# Patient Record
Sex: Female | Born: 1945
Health system: Southern US, Community
[De-identification: ages and names within clinical notes are randomized; demographics above are authoritative.]

## PROBLEM LIST (undated history)

## (undated) DIAGNOSIS — K219 Gastro-esophageal reflux disease without esophagitis: Secondary | ICD-10-CM

## (undated) DIAGNOSIS — K589 Irritable bowel syndrome without diarrhea: Secondary | ICD-10-CM

## (undated) DIAGNOSIS — S62101A Fracture of unspecified carpal bone, right wrist, initial encounter for closed fracture: Secondary | ICD-10-CM

## (undated) DIAGNOSIS — I509 Heart failure, unspecified: Secondary | ICD-10-CM

## (undated) DIAGNOSIS — E785 Hyperlipidemia, unspecified: Secondary | ICD-10-CM

## (undated) DIAGNOSIS — M1990 Unspecified osteoarthritis, unspecified site: Secondary | ICD-10-CM

## (undated) DIAGNOSIS — I10 Essential (primary) hypertension: Secondary | ICD-10-CM

## (undated) DIAGNOSIS — F329 Major depressive disorder, single episode, unspecified: Secondary | ICD-10-CM

## (undated) DIAGNOSIS — R7309 Other abnormal glucose: Secondary | ICD-10-CM

## (undated) DIAGNOSIS — H269 Unspecified cataract: Secondary | ICD-10-CM

## (undated) DIAGNOSIS — F0390 Unspecified dementia without behavioral disturbance: Secondary | ICD-10-CM

## (undated) DIAGNOSIS — D649 Anemia, unspecified: Secondary | ICD-10-CM

## (undated) DIAGNOSIS — Z5189 Encounter for other specified aftercare: Secondary | ICD-10-CM

## (undated) DIAGNOSIS — R011 Cardiac murmur, unspecified: Secondary | ICD-10-CM

## (undated) DIAGNOSIS — I4891 Unspecified atrial fibrillation: Secondary | ICD-10-CM

## (undated) DIAGNOSIS — F1020 Alcohol dependence, uncomplicated: Secondary | ICD-10-CM

## (undated) DIAGNOSIS — G459 Transient cerebral ischemic attack, unspecified: Secondary | ICD-10-CM

## (undated) DIAGNOSIS — F32A Depression, unspecified: Secondary | ICD-10-CM

## (undated) DIAGNOSIS — F419 Anxiety disorder, unspecified: Secondary | ICD-10-CM

## (undated) DIAGNOSIS — T7840XA Allergy, unspecified, initial encounter: Secondary | ICD-10-CM

## (undated) HISTORY — DX: Irritable bowel syndrome, unspecified: K58.9

## (undated) HISTORY — DX: Other abnormal glucose: R73.09

## (undated) HISTORY — PX: NECK SURGERY: SHX720

## (undated) HISTORY — DX: Hyperlipidemia, unspecified: E78.5

## (undated) HISTORY — PX: TUBAL LIGATION: SHX77

## (undated) HISTORY — DX: Unspecified atrial fibrillation: I48.91

## (undated) HISTORY — DX: Gastro-esophageal reflux disease without esophagitis: K21.9

## (undated) HISTORY — DX: Allergy, unspecified, initial encounter: T78.40XA

## (undated) HISTORY — PX: APPENDECTOMY: SHX54

## (undated) HISTORY — DX: Anxiety disorder, unspecified: F41.9

## (undated) HISTORY — DX: Alcohol dependence, uncomplicated: F10.20

## (undated) HISTORY — DX: Unspecified osteoarthritis, unspecified site: M19.90

## (undated) HISTORY — DX: Encounter for other specified aftercare: Z51.89

## (undated) HISTORY — DX: Unspecified cataract: H26.9

## (undated) HISTORY — DX: Transient cerebral ischemic attack, unspecified: G45.9

## (undated) HISTORY — DX: Unspecified dementia, unspecified severity, without behavioral disturbance, psychotic disturbance, mood disturbance, and anxiety: F03.90

---
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
## 1963-05-15 HISTORY — PX: TONSILLECTOMY: SUR1361

## 1975-05-15 HISTORY — PX: CERVICAL LAMINECTOMY: SHX94

## 1987-05-15 HISTORY — PX: BREAST SURGERY: SHX581

## 1989-05-14 HISTORY — PX: ABDOMINAL HYSTERECTOMY: SHX81

## 1998-03-31 ENCOUNTER — Other Ambulatory Visit: Admission: RE | Admit: 1998-03-31 | Discharge: 1998-03-31 | Payer: Self-pay | Admitting: Obstetrics and Gynecology

## 1998-05-18 ENCOUNTER — Encounter: Payer: Self-pay | Admitting: Internal Medicine

## 1998-05-18 ENCOUNTER — Ambulatory Visit (HOSPITAL_COMMUNITY): Admission: RE | Admit: 1998-05-18 | Discharge: 1998-05-18 | Payer: Self-pay | Admitting: Internal Medicine

## 1999-05-29 ENCOUNTER — Other Ambulatory Visit: Admission: RE | Admit: 1999-05-29 | Discharge: 1999-05-29 | Payer: Self-pay | Admitting: Obstetrics and Gynecology

## 2000-08-30 ENCOUNTER — Emergency Department (HOSPITAL_COMMUNITY): Admission: EM | Admit: 2000-08-30 | Discharge: 2000-08-30 | Payer: Self-pay | Admitting: *Deleted

## 2005-08-02 ENCOUNTER — Ambulatory Visit (HOSPITAL_COMMUNITY): Admission: RE | Admit: 2005-08-02 | Discharge: 2005-08-02 | Payer: Self-pay | Admitting: Internal Medicine

## 2006-11-08 ENCOUNTER — Ambulatory Visit (HOSPITAL_COMMUNITY): Admission: RE | Admit: 2006-11-08 | Discharge: 2006-11-08 | Payer: Self-pay | Admitting: Internal Medicine

## 2008-06-29 ENCOUNTER — Emergency Department (HOSPITAL_COMMUNITY): Admission: EM | Admit: 2008-06-29 | Discharge: 2008-06-30 | Payer: Self-pay | Admitting: Emergency Medicine

## 2009-02-19 ENCOUNTER — Emergency Department (HOSPITAL_COMMUNITY): Admission: EM | Admit: 2009-02-19 | Discharge: 2009-02-19 | Payer: Self-pay | Admitting: Emergency Medicine

## 2009-05-16 ENCOUNTER — Ambulatory Visit (HOSPITAL_COMMUNITY): Admission: RE | Admit: 2009-05-16 | Discharge: 2009-05-16 | Payer: Self-pay | Admitting: Internal Medicine

## 2010-08-29 LAB — RAPID URINE DRUG SCREEN, HOSP PERFORMED
Amphetamines: NOT DETECTED
Barbiturates: NOT DETECTED
Benzodiazepines: POSITIVE — AB
Opiates: NOT DETECTED

## 2010-08-29 LAB — URINALYSIS, ROUTINE W REFLEX MICROSCOPIC
Bilirubin Urine: NEGATIVE
Glucose, UA: NEGATIVE mg/dL
Ketones, ur: NEGATIVE mg/dL
pH: 6 (ref 5.0–8.0)

## 2010-08-29 LAB — ETHANOL: Alcohol, Ethyl (B): 94 mg/dL — ABNORMAL HIGH (ref 0–10)

## 2011-04-30 ENCOUNTER — Other Ambulatory Visit (HOSPITAL_COMMUNITY): Payer: Self-pay | Admitting: Internal Medicine

## 2011-04-30 DIAGNOSIS — Z1231 Encounter for screening mammogram for malignant neoplasm of breast: Secondary | ICD-10-CM

## 2011-05-17 LAB — HM COLONOSCOPY

## 2011-06-04 ENCOUNTER — Ambulatory Visit (HOSPITAL_COMMUNITY)
Admission: RE | Admit: 2011-06-04 | Discharge: 2011-06-04 | Disposition: A | Discharge status: Home / Self Care | Payer: Medicare Other | Source: Ambulatory Visit | Attending: Internal Medicine | Admitting: Internal Medicine

## 2011-06-04 DIAGNOSIS — Z1231 Encounter for screening mammogram for malignant neoplasm of breast: Secondary | ICD-10-CM | POA: Insufficient documentation

## 2011-09-04 ENCOUNTER — Emergency Department (HOSPITAL_COMMUNITY): Payer: Medicare Other

## 2011-09-04 ENCOUNTER — Encounter (HOSPITAL_COMMUNITY): Payer: Self-pay | Admitting: Emergency Medicine

## 2011-09-04 ENCOUNTER — Inpatient Hospital Stay (HOSPITAL_COMMUNITY)
Admission: EM | Admit: 2011-09-04 | Discharge: 2011-09-06 | DRG: 641 | Disposition: A | Discharge status: Home / Self Care | Payer: Medicare Other | Attending: Internal Medicine | Admitting: Internal Medicine

## 2011-09-04 DIAGNOSIS — F3289 Other specified depressive episodes: Secondary | ICD-10-CM | POA: Diagnosis present

## 2011-09-04 DIAGNOSIS — D72829 Elevated white blood cell count, unspecified: Secondary | ICD-10-CM | POA: Diagnosis present

## 2011-09-04 DIAGNOSIS — E871 Hypo-osmolality and hyponatremia: Secondary | ICD-10-CM | POA: Diagnosis present

## 2011-09-04 DIAGNOSIS — R9431 Abnormal electrocardiogram [ECG] [EKG]: Secondary | ICD-10-CM | POA: Diagnosis present

## 2011-09-04 DIAGNOSIS — Z9181 History of falling: Secondary | ICD-10-CM

## 2011-09-04 DIAGNOSIS — N289 Disorder of kidney and ureter, unspecified: Secondary | ICD-10-CM | POA: Diagnosis present

## 2011-09-04 DIAGNOSIS — F329 Major depressive disorder, single episode, unspecified: Secondary | ICD-10-CM | POA: Diagnosis present

## 2011-09-04 DIAGNOSIS — E876 Hypokalemia: Principal | ICD-10-CM | POA: Diagnosis present

## 2011-09-04 DIAGNOSIS — T502X5A Adverse effect of carbonic-anhydrase inhibitors, benzothiadiazides and other diuretics, initial encounter: Secondary | ICD-10-CM | POA: Diagnosis present

## 2011-09-04 DIAGNOSIS — I639 Cerebral infarction, unspecified: Secondary | ICD-10-CM

## 2011-09-04 DIAGNOSIS — I10 Essential (primary) hypertension: Secondary | ICD-10-CM | POA: Diagnosis present

## 2011-09-04 DIAGNOSIS — N179 Acute kidney failure, unspecified: Secondary | ICD-10-CM | POA: Diagnosis present

## 2011-09-04 HISTORY — DX: Major depressive disorder, single episode, unspecified: F32.9

## 2011-09-04 HISTORY — DX: Depression, unspecified: F32.A

## 2011-09-04 HISTORY — DX: Essential (primary) hypertension: I10

## 2011-09-04 LAB — DIFFERENTIAL
Basophils Relative: 0 % (ref 0–1)
Eosinophils Absolute: 0 10*3/uL (ref 0.0–0.7)
Neutrophils Relative %: 84 % — ABNORMAL HIGH (ref 43–77)

## 2011-09-04 LAB — CBC
MCH: 29.2 pg (ref 26.0–34.0)
MCHC: 34.1 g/dL (ref 30.0–36.0)
Platelets: 297 10*3/uL (ref 150–400)

## 2011-09-04 NOTE — ED Notes (Signed)
Pt states she fell in bathroom yesterday and hit head. States floor in BR is uneven and she tripped and fell. Pt states she has some abd pain and has problems with constipation. States she had a BM after just arriving to ED.

## 2011-09-04 NOTE — ED Notes (Signed)
Pt states she was dizzy upon getting up to ambulate per EMS. Pt states her knees got wobbly when trying to stand.

## 2011-09-04 NOTE — ED Provider Notes (Signed)
History     CSN: [id number]  Arrival date & time 09/04/11  2036   First MD Initiated Contact with Patient 09/04/11 2148      Chief Complaint  Patient presents with  . Altered Mental Status  . Fall    (Consider location/radiation/quality/duration/timing/severity/associated sxs/prior treatment) Patient is a 66 y.o. female presenting with altered mental status and fall. The history is provided by the patient.  Altered Mental Status  Fall  She noticed onset yesterday of being off balance and she fell once yesterday and once today. She did hit her head when she fell. She denies loss of consciousness. Her niece spoke with her yesterday morning and her speech was normal. Her niece called her this afternoon and she was not making sense when she was talking. Patient denies any injury but states that she is off balance when she walks. There's been no vertigo no nausea or vomiting. She denies chest pain, heaviness, tightness, or pressure. Symptoms have been unchanging. Nothing makes it better nothing makes it worse. She denies any pain anywhere.  Past Medical History  Diagnosis Date  . Hypertension   . Depression     No past surgical history on file.  No family history on file.  History  Substance Use Topics  . Smoking status: Not on file  . Smokeless tobacco: Not on file  . Alcohol Use:     OB History    Grav Para Term Preterm Abortions TAB SAB Ect Mult Living                  Review of Systems  Psychiatric/Behavioral: Positive for altered mental status.  All other systems reviewed and are negative.    Allergies  Review of patient's allergies indicates no known allergies.  Home Medications   Current Outpatient Rx  Name Route Sig Dispense Refill  . ALPRAZOLAM 1 MG PO TABS Oral Take 1 mg by mouth 3 (three) times daily as needed. For anxiety.    Marland Kitchen BISOPROLOL-HYDROCHLOROTHIAZIDE 5-6.25 MG PO TABS Oral Take 1 tablet by mouth daily.    . BUPROPION HCL ER (XL) 150 MG PO  TB24 Oral Take 450 mg by mouth daily.    Marland Kitchen FLUOXETINE HCL 40 MG PO CAPS Oral Take 40 mg by mouth daily.    Marland Kitchen LAMOTRIGINE 25 MG PO TABS Oral Take 25 mg by mouth daily.    Marland Kitchen MIRTAZAPINE 15 MG PO TABS Oral Take 15 mg by mouth at bedtime.    Marland Kitchen OLANZAPINE 10 MG PO TABS Oral Take 10 mg by mouth at bedtime.    . SERTRALINE HCL 100 MG PO TABS Oral Take 200 mg by mouth daily.    . TORSEMIDE 20 MG PO TABS Oral Take 20 mg by mouth 2 (two) times daily.      BP 116/52  Pulse 64  Temp(Src) 99.5 F (37.5 C) (Oral)  Resp 16  SpO2 98%  Physical Exam  Nursing note and vitals reviewed.  66 year old female who is awake, alert, and in no acute distress. Vital signs are normal. Oxygen saturation is 98% which is normal. Head is normocephalic and atraumatic. PERRLA, EOMI without nystagmus. Fundi show no hemorrhage, exudate, or papilledema. There is no facial asymmetry and tongue protrudes in the midline. Neck is nontender and supple without adenopathy or JVD. There is no carotid bruit. Back is nontender. Lungs are clear without rales, wheezes, or rhonchi. Heart has regular rate rhythm without murmur. Abdomen is soft, flat, nontender without masses or hepatosplenomegaly.  Extremities have full range of motion, no cyanosis or edema. Skin is warm and dry without rash. Neurologic: She is awake, alert, and oriented x3. Speech is spontaneous and appropriate and fluent without any dysarthria. Cranial nerves are intact. There is no pronator drift and no focal motor deficit. There is past pointing on the right on finger to nose testing. She has difficulty following commands to test rapid alternating motion, but she performs at the test only very slowly with her right hand and cannot follow the command to do the test with her left hand. Romberg test is grossly positive with the patient consistently falling backward. She is stable when sitting up.  ED Course  Procedures (including critical care time)  Results for orders placed  during the hospital encounter of 09/04/11  CBC      Component Value Range   WBC 19.9 (*) 4.0 - 10.5 (K/uL)   RBC 4.63  3.87 - 5.11 (MIL/uL)   Hemoglobin 13.5  12.0 - 15.0 (g/dL)   HCT 16.1  09.6 - 04.5 (%)   MCV 85.5  78.0 - 100.0 (fL)   MCH 29.2  26.0 - 34.0 (pg)   MCHC 34.1  30.0 - 36.0 (g/dL)   RDW 40.9  81.1 - 91.4 (%)   Platelets 297  150 - 400 (K/uL)  DIFFERENTIAL      Component Value Range   Neutrophils Relative 84 (*) 43 - 77 (%)   Neutro Abs 16.7 (*) 1.7 - 7.7 (K/uL)   Lymphocytes Relative 8 (*) 12 - 46 (%)   Lymphs Abs 1.5  0.7 - 4.0 (K/uL)   Monocytes Relative 8  3 - 12 (%)   Monocytes Absolute 1.6 (*) 0.1 - 1.0 (K/uL)   Eosinophils Relative 0  0 - 5 (%)   Eosinophils Absolute 0.0  0.0 - 0.7 (K/uL)   Basophils Relative 0  0 - 1 (%)   Basophils Absolute 0.0  0.0 - 0.1 (K/uL)  COMPREHENSIVE METABOLIC PANEL      Component Value Range   Sodium 133 (*) 135 - 145 (mEq/L)   Potassium 2.3 (*) 3.5 - 5.1 (mEq/L)   Chloride 87 (*) 96 - 112 (mEq/L)   CO2 29  19 - 32 (mEq/L)   Glucose, Bld 124 (*) 70 - 99 (mg/dL)   BUN 30 (*) 6 - 23 (mg/dL)   Creatinine, Ser 7.82 (*) 0.50 - 1.10 (mg/dL)   Calcium 9.2  8.4 - 95.6 (mg/dL)   Total Protein 7.1  6.0 - 8.3 (g/dL)   Albumin 4.2  3.5 - 5.2 (g/dL)   AST 23  0 - 37 (U/L)   ALT 15  0 - 35 (U/L)   Alkaline Phosphatase 66  39 - 117 (U/L)   Total Bilirubin 0.7  0.3 - 1.2 (mg/dL)   GFR calc non Af Amer 37 (*) >90 (mL/min)   GFR calc Af Amer 43 (*) >90 (mL/min)  CARDIAC PANEL(CRET KIN+CKTOT+MB+TROPI)      Component Value Range   Total CK 181 (*) 7 - 177 (U/L)   CK, MB 2.7  0.3 - 4.0 (ng/mL)   Troponin I <0.30  <0.30 (ng/mL)   Relative Index 1.5  0.0 - 2.5   PROTIME-INR      Component Value Range   Prothrombin Time 13.5  11.6 - 15.2 (seconds)   INR 1.01  0.00 - 1.49   APTT      Component Value Range   aPTT 32  24 - 37 (seconds)  Ct Head Wo Contrast  09/04/2011  *RADIOLOGY REPORT*  Clinical Data:  Fall, head trauma, dizziness,  neck trauma  CT HEAD WITHOUT CONTRAST CT CERVICAL SPINE WITHOUT CONTRAST  Technique:  Multidetector CT imaging of the head and cervical spine was performed following the standard protocol without intravenous contrast.  Multiplanar CT image reconstructions of the cervical spine were also generated.  Comparison:   None  CT HEAD  Findings: Minimal cortical volume loss noted with proportional ventricular prominence. No acute hemorrhage, acute infarction, or mass lesion is identified.  No midline shift.  No ventriculomegaly. No skull fracture.  Orbits and paranasal sinuses are intact.  IMPRESSION: No acute intracranial finding.  CT CERVICAL SPINE  Findings: C1 through the cervical thoracic junction is visualized in its entirety. No precervical soft tissue widening is present. Disc degenerative change is noted from C4-C7, most prominent at C5- C6 with mildly decreased vertebral body heights and intervertebral disc space, moderate uncovertebral joint hypertrophy and narrowing of the bilateral neural foramina.  No fracture or dislocation.  IMPRESSION: Mid cervical degenerative change, no acute osseous abnormality.  Original Report Authenticated By: Harrel Lemon, M.D.   Ct Cervical Spine Wo Contrast  09/04/2011  *RADIOLOGY REPORT*  Clinical Data:  Fall, head trauma, dizziness, neck trauma  CT HEAD WITHOUT CONTRAST CT CERVICAL SPINE WITHOUT CONTRAST  Technique:  Multidetector CT imaging of the head and cervical spine was performed following the standard protocol without intravenous contrast.  Multiplanar CT image reconstructions of the cervical spine were also generated.  Comparison:   None  CT HEAD  Findings: Minimal cortical volume loss noted with proportional ventricular prominence. No acute hemorrhage, acute infarction, or mass lesion is identified.  No midline shift.  No ventriculomegaly. No skull fracture.  Orbits and paranasal sinuses are intact.  IMPRESSION: No acute intracranial finding.  CT CERVICAL SPINE   Findings: C1 through the cervical thoracic junction is visualized in its entirety. No precervical soft tissue widening is present. Disc degenerative change is noted from C4-C7, most prominent at C5- C6 with mildly decreased vertebral body heights and intervertebral disc space, moderate uncovertebral joint hypertrophy and narrowing of the bilateral neural foramina.  No fracture or dislocation.  IMPRESSION: Mid cervical degenerative change, no acute osseous abnormality.  Original Report Authenticated By: Harrel Lemon, M.D.   Dg Chest Portable 1 View  09/04/2011  *RADIOLOGY REPORT*  Clinical Data: Altered mental status, fall  PORTABLE CHEST - 1 VIEW  Comparison: 05/16/2009  Findings: The patient is rotated to the right.  Heart size is normal. Lung volumes are low with crowding of the bronchovascular markings. Diffusely increased interstitial lung markings noted, without focal pulmonary opacity.  No pleural effusion.  IMPRESSION: Low volumes with crowding of the bronchovascular markings and diffuse interstitial prominence but no focal acute finding.  Original Report Authenticated By: Harrel Lemon, M.D.      Date: 09/04/2011  Rate: 64  Rhythm: normal sinus rhythm  QRS Axis: normal  Intervals: QT prolonged  ST/T Wave abnormalities: nonspecific ST changes  Conduction Disutrbances:nonspecific intraventricular conduction delay  Narrative Interpretation: Prolonged QT interval of with borderline intraventricular conduction delay and minor nonspecific ST segments. No old ECG available for comparison.  Old EKG Reviewed: none available  Potassium has come back very low. In light of prolonged QT interval, magnesium will be checked and she may need magnesium supplementation. She is given oral and intravenous potassium. Case is discussed with Dr. Kaylyn Layer who agrees to admit the patient.  1. Stroke  2. Renal insufficiency   3. Hypokalemia       MDM  Symptoms and signs strongly suggestive of  posterior circulation stroke with central vertigo. MRI scan is not available tonight but CT scan will be obtained and she will need to be admitted.        Dione Booze, MD 09/05/11 [id number]

## 2011-09-04 NOTE — ED Notes (Signed)
Pt BIB EMS. Pt lives at home by herself. Pt fell yesterday. Niece went over to check on pt and found her to have jumbled words and unsteady gait. Stroke screen negative per EMS. Pt has hx of HTN and Depression. Pt a/o x 4 per EMS.

## 2011-09-04 NOTE — ED Notes (Signed)
MD at bedside.  Dr. Glick at bedside 

## 2011-09-04 NOTE — ED Notes (Signed)
[id number]<AV> Expected date:<BR> Expected time:<BR> Means of arrival:<BR> Comments:<BR> Altered mental status

## 2011-09-04 NOTE — ED Notes (Signed)
Unable to obtain vital signs on patient due to patients urge to void. Patient refusing to allow at this time until she uses the restroom. Patient refuses to use bedpan even though gait is unsteady.

## 2011-09-05 ENCOUNTER — Encounter (HOSPITAL_COMMUNITY): Payer: Self-pay | Admitting: Internal Medicine

## 2011-09-05 DIAGNOSIS — N289 Disorder of kidney and ureter, unspecified: Secondary | ICD-10-CM | POA: Diagnosis present

## 2011-09-05 DIAGNOSIS — I359 Nonrheumatic aortic valve disorder, unspecified: Secondary | ICD-10-CM

## 2011-09-05 LAB — CBC
HCT: 37.2 % (ref 36.0–46.0)
Hemoglobin: 12.5 g/dL (ref 12.0–15.0)
MCV: 86.5 fL (ref 78.0–100.0)
RDW: 13.8 % (ref 11.5–15.5)
WBC: 16.9 10*3/uL — ABNORMAL HIGH (ref 4.0–10.5)

## 2011-09-05 LAB — BASIC METABOLIC PANEL
BUN: 28 mg/dL — ABNORMAL HIGH (ref 6–23)
CO2: 26 mEq/L (ref 19–32)
Chloride: 97 mEq/L (ref 96–112)
Creatinine, Ser: 1.27 mg/dL — ABNORMAL HIGH (ref 0.50–1.10)
GFR calc Af Amer: 49 mL/min — ABNORMAL LOW (ref >90)
Potassium: 3.3 mEq/L — ABNORMAL LOW (ref 3.5–5.1)

## 2011-09-05 LAB — URINALYSIS, ROUTINE W REFLEX MICROSCOPIC
Bilirubin Urine: NEGATIVE
Glucose, UA: NEGATIVE mg/dL
Ketones, ur: NEGATIVE mg/dL
Leukocytes, UA: NEGATIVE
Protein, ur: NEGATIVE mg/dL

## 2011-09-05 LAB — CARDIAC PANEL(CRET KIN+CKTOT+MB+TROPI)
CK, MB: 6 ng/mL — ABNORMAL HIGH (ref 0.3–4.0)
Relative Index: 1.5 (ref 0.0–2.5)
Relative Index: 1.5 (ref 0.0–2.5)
Relative Index: 2 (ref 0.0–2.5)
Total CK: 181 U/L — ABNORMAL HIGH (ref 7–177)
Total CK: 298 U/L — ABNORMAL HIGH (ref 7–177)
Troponin I: <0.3 ng/mL (ref <0.30)

## 2011-09-05 LAB — TSH: TSH: 2.642 u[IU]/mL (ref 0.350–4.500)

## 2011-09-05 LAB — COMPREHENSIVE METABOLIC PANEL
ALT: 15 U/L (ref 0–35)
Albumin: 4.2 g/dL (ref 3.5–5.2)
Alkaline Phosphatase: 66 U/L (ref 39–117)
BUN: 30 mg/dL — ABNORMAL HIGH (ref 6–23)
Potassium: 2.3 mEq/L — CL (ref 3.5–5.1)
Sodium: 133 mEq/L — ABNORMAL LOW (ref 135–145)
Total Protein: 7.1 g/dL (ref 6.0–8.3)

## 2011-09-05 LAB — PROTIME-INR: Prothrombin Time: 13.5 seconds (ref 11.6–15.2)

## 2011-09-05 LAB — MAGNESIUM: Magnesium: 3 mg/dL — ABNORMAL HIGH (ref 1.5–2.5)

## 2011-09-05 LAB — NA AND K (SODIUM & POTASSIUM), RAND UR: Sodium, Ur: 10 mEq/L

## 2011-09-05 LAB — UREA NITROGEN, URINE: Urea Nitrogen, Ur: 372 mg/dL

## 2011-09-05 MED ORDER — POTASSIUM CHLORIDE 10 MEQ/100ML IV SOLN
10.0000 meq | INTRAVENOUS | Status: AC
  Administered 2011-09-05 (×6): 10 meq via INTRAVENOUS
  Filled 2011-09-05 (×6): qty 100

## 2011-09-05 MED ORDER — SENNA 8.6 MG PO TABS
1.0000 | ORAL_TABLET | Freq: Two times a day (BID) | ORAL | Status: DC
  Administered 2011-09-05 – 2011-09-06 (×2): 8.6 mg via ORAL
  Filled 2011-09-05 (×2): qty 1

## 2011-09-05 MED ORDER — ONDANSETRON HCL 4 MG PO TABS: 4.0000 mg | ORAL_TABLET | Freq: Four times a day (QID) | ORAL | Status: DC | PRN

## 2011-09-05 MED ORDER — BUPROPION HCL ER (XL) 300 MG PO TB24
450.0000 mg | ORAL_TABLET | Freq: Every day | ORAL | Status: DC
  Administered 2011-09-05 – 2011-09-06 (×2): 450 mg via ORAL
  Filled 2011-09-05 (×2): qty 1

## 2011-09-05 MED ORDER — ACETAMINOPHEN 650 MG RE SUPP: 650.0000 mg | Freq: Four times a day (QID) | RECTAL | Status: DC | PRN

## 2011-09-05 MED ORDER — FLUOXETINE HCL 20 MG PO CAPS
40.0000 mg | ORAL_CAPSULE | Freq: Every day | ORAL | Status: DC
  Administered 2011-09-05 – 2011-09-06 (×2): 40 mg via ORAL
  Filled 2011-09-05 (×2): qty 2

## 2011-09-05 MED ORDER — SODIUM CHLORIDE 0.9 % IJ SOLN
3.0000 mL | Freq: Two times a day (BID) | INTRAMUSCULAR | Status: DC
  Administered 2011-09-05: 3 mL via INTRAVENOUS

## 2011-09-05 MED ORDER — SERTRALINE HCL 100 MG PO TABS
200.0000 mg | ORAL_TABLET | Freq: Every day | ORAL | Status: DC
Start: 2011-09-05 — End: 2011-09-06
  Administered 2011-09-05 – 2011-09-06 (×2): 200 mg via ORAL
  Filled 2011-09-05 (×2): qty 2

## 2011-09-05 MED ORDER — POTASSIUM CHLORIDE CRYS ER 20 MEQ PO TBCR
40.0000 meq | EXTENDED_RELEASE_TABLET | Freq: Once | ORAL | Status: AC
  Administered 2011-09-05: 40 meq via ORAL
  Filled 2011-09-05: qty 2

## 2011-09-05 MED ORDER — POTASSIUM CHLORIDE IN NACL 20-0.9 MEQ/L-% IV SOLN
INTRAVENOUS | Status: DC
  Administered 2011-09-05 (×2): via INTRAVENOUS
  Filled 2011-09-05 (×5): qty 1000

## 2011-09-05 MED ORDER — SODIUM CHLORIDE 0.9 % IV SOLN
INTRAVENOUS | Status: AC
  Administered 2011-09-05: 04:00:00 via INTRAVENOUS

## 2011-09-05 MED ORDER — POLYETHYLENE GLYCOL 3350 17 G PO PACK
17.0000 g | PACK | Freq: Every day | ORAL | Status: DC | PRN
  Filled 2011-09-05: qty 1

## 2011-09-05 MED ORDER — POTASSIUM CHLORIDE 10 MEQ/100ML IV SOLN
10.0000 meq | Freq: Once | INTRAVENOUS | Status: AC
  Administered 2011-09-05: 10 meq via INTRAVENOUS
  Filled 2011-09-05: qty 100

## 2011-09-05 MED ORDER — ONDANSETRON HCL 4 MG/2ML IJ SOLN: 4.0000 mg | Freq: Three times a day (TID) | INTRAMUSCULAR | Status: AC | PRN

## 2011-09-05 MED ORDER — ONDANSETRON HCL 4 MG/2ML IJ SOLN: 4.0000 mg | Freq: Four times a day (QID) | INTRAMUSCULAR | Status: DC | PRN

## 2011-09-05 MED ORDER — DOCUSATE SODIUM 100 MG PO CAPS
100.0000 mg | ORAL_CAPSULE | Freq: Two times a day (BID) | ORAL | Status: DC
  Administered 2011-09-05 – 2011-09-06 (×2): 100 mg via ORAL
  Filled 2011-09-05 (×4): qty 1

## 2011-09-05 MED ORDER — POTASSIUM CHLORIDE CRYS ER 20 MEQ PO TBCR
40.0000 meq | EXTENDED_RELEASE_TABLET | Freq: Two times a day (BID) | ORAL | Status: AC
  Administered 2011-09-05 (×2): 40 meq via ORAL
  Filled 2011-09-05 (×2): qty 2

## 2011-09-05 MED ORDER — LAMOTRIGINE 25 MG PO TABS
25.0000 mg | ORAL_TABLET | Freq: Every day | ORAL | Status: DC
  Administered 2011-09-05 – 2011-09-06 (×2): 25 mg via ORAL
  Filled 2011-09-05 (×2): qty 1

## 2011-09-05 MED ORDER — ACETAMINOPHEN 325 MG PO TABS
650.0000 mg | ORAL_TABLET | Freq: Four times a day (QID) | ORAL | Status: DC | PRN
  Administered 2011-09-05 – 2011-09-06 (×3): 650 mg via ORAL
  Filled 2011-09-05 (×3): qty 2

## 2011-09-05 MED ORDER — HEPARIN SODIUM (PORCINE) 5000 UNIT/ML IJ SOLN
5000.0000 [IU] | Freq: Three times a day (TID) | INTRAMUSCULAR | Status: DC
  Administered 2011-09-05 – 2011-09-06 (×4): 5000 [IU] via SUBCUTANEOUS
  Filled 2011-09-05 (×7): qty 1

## 2011-09-05 NOTE — ED Notes (Signed)
Attempted to call report. RN unable to take report at this time. Will call back.  

## 2011-09-05 NOTE — ED Notes (Signed)
Pt's niece, phone number [phone].

## 2011-09-05 NOTE — ED Notes (Signed)
MD at bedside.  Dr. Glick at bedside 

## 2011-09-05 NOTE — Progress Notes (Signed)
  Patient admitted earlier today. H&P reviewed.  Patient feels fine. Denies any complaints. No syncopal episodes.  Vital signs reviewed. No focal neurological deficits. Systolic murmur at aortic area. Lungs are clear. Abdomen is soft.  Continue current treatment. Recheck labs later today. PT/OT Agree with holding off on MRI. Order ECHO. Repeat EKG  Full Code DVT prophylaxis  Evann Koelzer 09/05/2011 7:49 AM

## 2011-09-05 NOTE — Progress Notes (Signed)
   CARE MANAGEMENT NOTE 09/05/2011  Patient:  Angelica Kelley, Angelica Kelley   Account Number:  [id number]  Date Initiated:  09/05/2011  Documentation initiated by:  Jiles Crocker  Subjective/Objective Assessment:   ADMITTED WITH ALCO, FALLS, HYPONATREMIA, HYPOKALEMIA     Action/Plan:   PCP IS DR Oneta Rack; LIVES AT HOME ALONE   Anticipated DC Date:  09/12/2011   Anticipated DC Plan:  HOME/SELF CARE          Status of service: CONTINUE TO FOLLOW FOR DCP   Discharge Disposition:  HOME/SELF CARE  Per UR Regulation:  Reviewed for med. necessity/level of care/duration of stay  Comments:  09/05/2011- B Sophia Cubero RN, BSN, MHA

## 2011-09-05 NOTE — H&P (Signed)
PCP:   Nadean Corwin, MD, MD  Confirmed with pt  Chief Complaint:  Fall  HPI: 66yoF with h/o HTN, depression, presents with a fall and found to have  electrolyte abnormalities, acute vs chronic renal insufficiency,  leukocytosis.   Pt does not have much medical records in [facility], but is pretty good historian.  There was apparently a neice here earlier but now gone. She lives at home  alone and is pretty healthy, only endorses HTN and depression, and possibly  prior vertigo a year ago, ambulatory without cane/walker. She was in her  usual state of health including having seen her PCP on April 1st for a check  up, until Tuesday afternoon when she was in her bathroom and got "swimmy  headed" with some spinning of the room. she was trying to move something in  her bathroom. She apparently had a fall but can't really elaborate how, but  it doesn't seem like had LOC. Her neice helped her up. She reports only one  fall but there may have been two per ED staff. Reportedly her neice called  her later and thought her speech was off, and her balance may have been off.   In the ED, Tmax was 99.5, HR 64-66, BP 104/45 - 116/52. Labs with hypoNa 133,  hypoK 2.3, hypoCl 87, renal 30/1.42. Negative cardiac enyzmes. WBC 19.9 with  84% neutros, rest of CBC negative. UA negative. CXR with low volumes,  crowding of bronchovasculature, diffuse interstitial prominence, but nothing  acute. CT head with nothing acute, CT c-spine negative. Pt was given 10 mEq  IV KCl and 40 mEq PO.   PT states she's been in good health without major recent complaints, other  than constipation for which she had to take a regimen and is now going more  frequently. She denies any other promontory symptoms during the falls, no CP,  SOB, palpitations, focal neuro deficits, facial drooping. She denies any GI  symptoms other than constipation, no cough, no dysuria. ROS otherwise  negative. She is very thirsty, asking for  water.   Past Medical History  Diagnosis Date  . Hypertension   . Depression     Past Surgical History  Procedure Date  . Neck surgery Remote     Ruptured disc, per patient. Got infected.     Medications:  HOME MEDS: She can name demadex and "another BP med" and can name prozac, wellbutrin, xanax, and lamictal Prior to Admission medications   Medication Sig Start Date End Date Taking? Authorizing Provider  ALPRAZolam Prudy Feeler) 1 MG tablet Take 1 mg by mouth 3 (three) times daily as needed. For anxiety.   Yes Historical Provider, MD  bisoprolol-hydrochlorothiazide (ZIAC) 5-6.25 MG per tablet Take 1 tablet by mouth daily.   Yes Historical Provider, MD  buPROPion (WELLBUTRIN XL) 150 MG 24 hr tablet Take 450 mg by mouth daily.   Yes Historical Provider, MD  FLUoxetine (PROZAC) 40 MG capsule Take 40 mg by mouth daily.   Yes Historical Provider, MD  lamoTRIgine (LAMICTAL) 25 MG tablet Take 25 mg by mouth daily.   Yes Historical Provider, MD  mirtazapine (REMERON) 15 MG tablet Take 15 mg by mouth at bedtime.   Yes Historical Provider, MD  OLANZapine (ZYPREXA) 10 MG tablet Take 10 mg by mouth at bedtime.   Yes Historical Provider, MD  sertraline (ZOLOFT) 100 MG tablet Take 200 mg by mouth daily.   Yes Historical Provider, MD  torsemide (DEMADEX) 20 MG tablet Take 20 mg  by mouth 2 (two) times daily.   Yes Historical Provider, MD    Allergies:  No Known Allergies  Social History:   does not have a smoking history on file. She does not have any smokeless tobacco history on file. Her alcohol and drug histories not on file. Pt's niece, phone number [phone]. Pt lives alone at home, and doesn't use a cane or walker, is still pretty active. Never smoker.   Family History: No family history on file.  Physical Exam: Filed Vitals:   09/05/11 0333 09/05/11 0341 09/05/11 0404 09/05/11 0407  BP: 103/56 119/68 92/59 95/56   Pulse: 64 69 77 75  Temp: 98.4 F (36.9 C)     TempSrc: Oral       Resp: 17     SpO2: 99%      Blood pressure 95/56, pulse 75, temperature 98.4 F (36.9 C), temperature source Oral, resp. rate 17, SpO2 99.00%. Gen: Elderly appearing F in no distress, is a bit fidgety and a little  forgetful on details but overall can give her history and appears well. I saw  her getting out of stretcher to bed, seemed a little unstead but not  floridly. She was able to get to the bedside toilet too. Breathing  comfortably. Pleasant and nice.  HEENT: Pupils round and equal, irises, conjunctivae clear. EOMI, no  nystagmus, conjugate gaze. Mouth/tongue/lips are frankly parched appearing.  Lungs: CTAB no w/c/r, smooth laminar flow, no adventitious sounds, normal  exam Heart: Regular, no m/g appreciated, possible slight crescendo systolic murmur  appreciated Abd: Soft, not tender or distended, normal exam, unimpressive Extrem: Warm, perfusing well, radials palpable, no BLE edema noted but she  does have varicosities. Normal exam Neuro: Alert and attentive, answers questions well, moves extremities on her  own with good intact strength throughout CN 2-12 intact no slurring or  drooping, basically non focal neuro exam Back: Appears normal without TTP of the spine, there is a low left near  midline ecchymosis from the fall.    Labs & Imaging Results for orders placed during the hospital encounter of 09/04/11 (from the past 48 hour(s))  CBC     Status: Abnormal   Collection Time   09/04/11 11:20 PM      Component Value Range Comment   WBC 19.9 (*) 4.0 - 10.5 (K/uL)    RBC 4.63  3.87 - 5.11 (MIL/uL)    Hemoglobin 13.5  12.0 - 15.0 (g/dL)    HCT 09.8  11.9 - 14.7 (%)    MCV 85.5  78.0 - 100.0 (fL)    MCH 29.2  26.0 - 34.0 (pg)    MCHC 34.1  30.0 - 36.0 (g/dL)    RDW 82.9  56.2 - 13.0 (%)    Platelets 297  150 - 400 (K/uL)   DIFFERENTIAL     Status: Abnormal   Collection Time   09/04/11 11:20 PM      Component Value Range Comment   Neutrophils Relative 84 (*) 43 - 77  (%)    Neutro Abs 16.7 (*) 1.7 - 7.7 (K/uL)    Lymphocytes Relative 8 (*) 12 - 46 (%)    Lymphs Abs 1.5  0.7 - 4.0 (K/uL)    Monocytes Relative 8  3 - 12 (%)    Monocytes Absolute 1.6 (*) 0.1 - 1.0 (K/uL)    Eosinophils Relative 0  0 - 5 (%)    Eosinophils Absolute 0.0  0.0 - 0.7 (K/uL)    Basophils Relative  0  0 - 1 (%)    Basophils Absolute 0.0  0.0 - 0.1 (K/uL)   COMPREHENSIVE METABOLIC PANEL     Status: Abnormal   Collection Time   09/04/11 11:20 PM      Component Value Range Comment   Sodium 133 (*) 135 - 145 (mEq/L)    Potassium 2.3 (*) 3.5 - 5.1 (mEq/L)    Chloride 87 (*) 96 - 112 (mEq/L)    CO2 29  19 - 32 (mEq/L)    Glucose, Bld 124 (*) 70 - 99 (mg/dL)    BUN 30 (*) 6 - 23 (mg/dL)    Creatinine, Ser 4.09 (*) 0.50 - 1.10 (mg/dL)    Calcium 9.2  8.4 - 10.5 (mg/dL)    Total Protein 7.1  6.0 - 8.3 (g/dL)    Albumin 4.2  3.5 - 5.2 (g/dL)    AST 23  0 - 37 (U/L)    ALT 15  0 - 35 (U/L)    Alkaline Phosphatase 66  39 - 117 (U/L)    Total Bilirubin 0.7  0.3 - 1.2 (mg/dL)    GFR calc non Af Amer 37 (*) >90 (mL/min)    GFR calc Af Amer 43 (*) >90 (mL/min)   CARDIAC PANEL(CRET KIN+CKTOT+MB+TROPI)     Status: Abnormal   Collection Time   09/04/11 11:20 PM      Component Value Range Comment   Total CK 181 (*) 7 - 177 (U/L)    CK, MB 2.7  0.3 - 4.0 (ng/mL)    Troponin I <0.30  <0.30 (ng/mL)    Relative Index 1.5  0.0 - 2.5    PROTIME-INR     Status: Normal   Collection Time   09/04/11 11:20 PM      Component Value Range Comment   Prothrombin Time 13.5  11.6 - 15.2 (seconds)    INR 1.01  0.00 - 1.49    APTT     Status: Normal   Collection Time   09/04/11 11:20 PM      Component Value Range Comment   aPTT 32  24 - 37 (seconds)   MAGNESIUM     Status: Abnormal   Collection Time   09/04/11 11:20 PM      Component Value Range Comment   Magnesium 3.0 (*) 1.5 - 2.5 (mg/dL)   URINALYSIS, ROUTINE W REFLEX MICROSCOPIC     Status: Normal   Collection Time   09/05/11  1:03 AM       Component Value Range Comment   Color, Urine YELLOW  YELLOW     APPearance CLEAR  CLEAR     Specific Gravity, Urine 1.013  1.005 - 1.030     pH 7.0  5.0 - 8.0     Glucose, UA NEGATIVE  NEGATIVE (mg/dL)    Hgb urine dipstick NEGATIVE  NEGATIVE     Bilirubin Urine NEGATIVE  NEGATIVE     Ketones, ur NEGATIVE  NEGATIVE (mg/dL)    Protein, ur NEGATIVE  NEGATIVE (mg/dL)    Urobilinogen, UA 1.0  0.0 - 1.0 (mg/dL)    Nitrite NEGATIVE  NEGATIVE     Leukocytes, UA NEGATIVE  NEGATIVE  MICROSCOPIC NOT DONE ON URINES WITH NEGATIVE PROTEIN, BLOOD, LEUKOCYTES, NITRITE, OR GLUCOSE <1000 mg/dL.   Ct Head Wo Contrast  09/04/2011  *RADIOLOGY REPORT*  Clinical Data:  Fall, head trauma, dizziness, neck trauma  CT HEAD WITHOUT CONTRAST CT CERVICAL SPINE WITHOUT CONTRAST  Technique:  Multidetector CT imaging of  the head and cervical spine was performed following the standard protocol without intravenous contrast.  Multiplanar CT image reconstructions of the cervical spine were also generated.  Comparison:   None  CT HEAD  Findings: Minimal cortical volume loss noted with proportional ventricular prominence. No acute hemorrhage, acute infarction, or mass lesion is identified.  No midline shift.  No ventriculomegaly. No skull fracture.  Orbits and paranasal sinuses are intact.  IMPRESSION: No acute intracranial finding.  CT CERVICAL SPINE  Findings: C1 through the cervical thoracic junction is visualized in its entirety. No precervical soft tissue widening is present. Disc degenerative change is noted from C4-C7, most prominent at C5- C6 with mildly decreased vertebral body heights and intervertebral disc space, moderate uncovertebral joint hypertrophy and narrowing of the bilateral neural foramina.  No fracture or dislocation.  IMPRESSION: Mid cervical degenerative change, no acute osseous abnormality.  Original Report Authenticated By: Harrel Lemon, M.D.   Ct Cervical Spine Wo Contrast  09/04/2011  *RADIOLOGY  REPORT*  Clinical Data:  Fall, head trauma, dizziness, neck trauma  CT HEAD WITHOUT CONTRAST CT CERVICAL SPINE WITHOUT CONTRAST  Technique:  Multidetector CT imaging of the head and cervical spine was performed following the standard protocol without intravenous contrast.  Multiplanar CT image reconstructions of the cervical spine were also generated.  Comparison:   None  CT HEAD  Findings: Minimal cortical volume loss noted with proportional ventricular prominence. No acute hemorrhage, acute infarction, or mass lesion is identified.  No midline shift.  No ventriculomegaly. No skull fracture.  Orbits and paranasal sinuses are intact.  IMPRESSION: No acute intracranial finding.  CT CERVICAL SPINE  Findings: C1 through the cervical thoracic junction is visualized in its entirety. No precervical soft tissue widening is present. Disc degenerative change is noted from C4-C7, most prominent at C5- C6 with mildly decreased vertebral body heights and intervertebral disc space, moderate uncovertebral joint hypertrophy and narrowing of the bilateral neural foramina.  No fracture or dislocation.  IMPRESSION: Mid cervical degenerative change, no acute osseous abnormality.  Original Report Authenticated By: Harrel Lemon, M.D.   Dg Chest Portable 1 View  09/04/2011  *RADIOLOGY REPORT*  Clinical Data: Altered mental status, fall  PORTABLE CHEST - 1 VIEW  Comparison: 05/16/2009  Findings: The patient is rotated to the right.  Heart size is normal. Lung volumes are low with crowding of the bronchovascular markings. Diffusely increased interstitial lung markings noted, without focal pulmonary opacity.  No pleural effusion.  IMPRESSION: Low volumes with crowding of the bronchovascular markings and diffuse interstitial prominence but no focal acute finding.  Original Report Authenticated By: Harrel Lemon, M.D.    ECG: NSR 64 bpm, LAD, possible BAE, narrow QRS, no Q waves, normal RWP.  Downwardly sloping ST segments  with inverted T waves in anterolateral leads  and a bit of ST depression in a couple of the leads. No prior.    Impression Present on Admission:  .Hyponatremia .Hypochloremia .Hypokalemia .Renal insufficiency .Leukocytosis .Abnormal ECG  66yoF with h/o HTN, depression, presents with a fall and found to have  electrolyte abnormalities, acute vs chronic renal insufficiency,  leukocytosis.   1. Fall: ED was concerned about stroke, however given her diffuse electrolyte  abnormalities I suspect this is metabolic. It would be highly coincidental to  have such abnormalities and happen to have a frank CVA at the same time, and  the pt denies really any other stroke symptoms. So as discussed with the pt,  we'll treat the lytes  and just monitor, hold on MRI brain for now, get PT  consult. Of note, pt able to stand up and walk a bit in the room without leg  or hip pain, so defer on plain films for now. All of the above was explained  to the patient.   2. HypoNa, hypoCl, hypoK: I think this is all medication effect, probably  mostly from torsemide and probably in combination with HCTZ. She looks  frankly very dry and feels like it too.   - Holding torsemide and bisoprolol/HCTZ pill. Consider stopping torsemide for  good.  - IVF with KCl, runs of KCl, and repletion with PO KCl - Get orthostatics.   3. Leukocytosis: Tmax was 99.5, so not clearly febrile. UA and CXR negative.  She really has absolutely nothing on ROS to suggest active infection, so I'm  considering this stress reaction to the fall, not starting ABx, and will just  monitor for now.   4. ST depressions and prominent regional TW inversions on ECG: With no prior  for comparison. Cardiac enzymes are negative x1, and pt has no chest pain now  or in the recent past. Suspect this is cardiac strain from metabolic  abnormalities.  - Trend cardiac enzymes and repeat ECG when lytes are better.   5. Acute vs chronic renal  insufficiency: renal is 30/1.42 with no baseline.  Ratio doesn't suggest pre-renal, UA very unremarkable, not even proteinuria.  - Urine lytes and osm for FeNa and FeUrea, will give IVF challenge and trend  BMET.   6. HTN: Holding home bisoprolol/HCTZ combo pill, and torsemide.   7. Psych: Holding home xanax, remeron, zyprexa. Continue bupropion, prozac,  lamictal, zoloft (these are the only meds the pt can name, so will continue  only these).   SubQ heparin Telemetry, [facility] team 3 Presumed full code, unable to discuss. Pt had to use bathroom urgently.   Other plans as per orders.   Titus Drone 09/05/2011, 4:14 AM

## 2011-09-05 NOTE — Progress Notes (Signed)
  Echocardiogram 2D Echocardiogram has been performed.  Angelica Kelley A 09/05/2011, 4:03 PM

## 2011-09-05 NOTE — Evaluation (Signed)
Physical Therapy Evaluation One time eval and D/C from PT Patient Details Name: Angelica Kelley MRN: [id number] DOB: 10/08/1945 Today's Date: 09/05/2011 Time: 3086-5784 PT Time Calculation (min): 16 min  PT Assessment / Plan / Recommendation Clinical Impression  Pt admitted for recurrent fall and diagnosed with HypoNa, HypoCl, HypoK.  Pt reports she does occasionally lose her balance but does not fall to ground.  Pt also reports having some renovations in her bathroom and the floor is "apart" and that is where she has been falling.  Pt reports her shower is only in this bathroom.  Pt presents at supervision to modified independent level on eval and denies dizziness with mobility.  Recommend HHPT for home safety eval.    PT Assessment  All further PT needs can be met in the next venue of care    Follow Up Recommendations  Home health PT    Equipment Recommendations  None recommended by PT    Frequency      Precautions / Restrictions Precautions Precautions: Fall   Pertinent Vitals/Pain 100% on room air      Mobility  Bed Mobility Bed Mobility: Supine to Sit Supine to Sit: 6: Modified independent (Device/Increase time) Transfers Transfers: Sit to Stand;Stand to Sit Sit to Stand: 6: Modified independent (Device/Increase time) Stand to Sit: 6: Modified independent (Device/Increase time) Ambulation/Gait Ambulation/Gait Assistance: 5: Supervision Ambulation Distance (Feet): 200 Feet Assistive device: None Ambulation/Gait Assistance Details: pt with occasional unsteady gait but no LOB or physical assist needed Gait Pattern: Within Functional Limits    Exercises     PT Goals    Visit Information  Last PT Received On: 09/05/11 Assistance Needed: +1    Subjective Data  Subjective: "my daughter thinks I fall all the time but I don't."   Prior Functioning  Home Living Lives With: Alone Type of Home: House Home Access: Stairs to enter [employer] of Steps:  4-5 Entrance Stairs-Rails: None Home Layout: One level Home Adaptive Equipment: None Prior Function Level of Independence: Independent Communication Communication: No difficulties    Cognition  Overall Cognitive Status: Appears within functional limits for tasks assessed/performed Orientation Level: Appears intact for tasks assessed    Extremity/Trunk Assessment Right Upper Extremity Assessment RUE ROM/Strength/Tone: [facility] for tasks assessed Left Upper Extremity Assessment LUE ROM/Strength/Tone: [facility] for tasks assessed Right Lower Extremity Assessment RLE ROM/Strength/Tone: [facility] for tasks assessed Left Lower Extremity Assessment LLE ROM/Strength/Tone: [facility] for tasks assessed   Balance High Level Balance High Level Balance Activites: Sudden stops;Head turns High Level Balance Comments: performed with ambulation and pt had no disruption of balance or change in gait  End of Session PT - End of Session Activity Tolerance: Patient tolerated treatment well Patient left: in chair;with call bell/phone within reach;with nursing in room   [facility] E 09/05/2011, 11:58 AM Pager: [phone]

## 2011-09-06 LAB — CARDIAC PANEL(CRET KIN+CKTOT+MB+TROPI)
CK, MB: 9.1 ng/mL (ref 0.3–4.0)
Relative Index: 2.8 — ABNORMAL HIGH (ref 0.0–2.5)
Troponin I: <0.3 ng/mL (ref <0.30)

## 2011-09-06 LAB — BASIC METABOLIC PANEL
GFR calc Af Amer: 66 mL/min — ABNORMAL LOW (ref >90)
GFR calc non Af Amer: 57 mL/min — ABNORMAL LOW (ref >90)
Potassium: 3.9 mEq/L (ref 3.5–5.1)
Sodium: 137 mEq/L (ref 135–145)

## 2011-09-06 LAB — CBC
Hemoglobin: 11.7 g/dL — ABNORMAL LOW (ref 12.0–15.0)
RBC: 4.04 MIL/uL (ref 3.87–5.11)

## 2011-09-06 LAB — GLUCOSE, CAPILLARY: Glucose-Capillary: 117 mg/dL — ABNORMAL HIGH (ref 70–99)

## 2011-09-06 MED ORDER — POLYETHYLENE GLYCOL 3350 17 G PO PACK: 17.0000 g | PACK | Freq: Every day | ORAL | Status: AC | PRN

## 2011-09-06 MED ORDER — MECLIZINE HCL 25 MG PO TABS: 25.0000 mg | ORAL_TABLET | Freq: Three times a day (TID) | ORAL | Status: AC | PRN

## 2011-09-06 MED ORDER — POTASSIUM CHLORIDE ER 10 MEQ PO TBCR: 20.0000 meq | EXTENDED_RELEASE_TABLET | Freq: Every day | ORAL | Status: DC

## 2011-09-06 MED ORDER — BISACODYL 10 MG RE SUPP
10.0000 mg | Freq: Once | RECTAL | Status: AC
  Administered 2011-09-06: 10 mg via RECTAL
  Filled 2011-09-06: qty 1

## 2011-09-06 MED ORDER — DSS 100 MG PO CAPS: 100.0000 mg | ORAL_CAPSULE | Freq: Two times a day (BID) | ORAL | Status: AC

## 2011-09-06 NOTE — Discharge Instructions (Signed)

## 2011-09-06 NOTE — Discharge Summary (Signed)
Physician Discharge Summary  Patient ID: Angelica Kelley MRN: [id number] DOB/AGE: November 29, 1945 66 y.o.  Admit date: 09/04/2011 Discharge date: 09/06/2011  PCP: Nadean Corwin, MD, MD  Discharge Diagnoses:  Principal Problem:  *Recurrent falls Active Problems:  Leukocytosis  Abnormal ECG   Recommendations to PCP 1. BMET in 1 week 2. EKG at follow up   Discharged Condition: fair  Initial History: 66yoF with h/o HTN, depression, presented with a fall and found to have electrolyte abnormalities, acute vs chronic renal insufficiency, leukocytosis. She lives at home alone and is pretty healthy, only endorses HTN and depression, and possibly prior vertigo a year ago, ambulatory without cane/walker. She was in her usual state of health including having seen her PCP on April 1st for a check  up, until Tuesday afternoon when she was in her bathroom and got "swimmy headed" with some spinning of the room. she was trying to move something in her bathroom. She apparently had a fall but can't really elaborate how, but it doesn't seem like had LOC. Her neice helped her up. She reported only one fall but there may have been two per ED staff. Reportedly her neice called her later and thought her speech was off, and her balance may have been off.   Patient stated she's been in good health without major recent complaints, other than constipation for which she had to take a regimen and is now going more frequently. She denied any other promontory symptoms during the falls, no CP, SOB, palpitations, focal neuro deficits, facial drooping. She denied any GI symptoms other than constipation, no cough, no dysuria.     Hospital Course:   1. Fall: Given her diffuse electrolyte abnormalities fall was thought to be due to metabolic reasons. Patient was not noted to have focal deficits at admission or since then. CT head was unremarkable. MRI was not done for above reasons. PT evaluated and recommends HH which  will be arranged.  2. Hyponatremia and hypokalemia: This was thought to be due to medication effect, probably mostly from torsemide and probably in combination with HCTZ. She looked dehydrated and had ARF as well. Patient was hydrated and given potassium supplements and her electrolytes are back to normal. She will be asked to take KCL with her anti-hypertensives. Demadex will be discontinued for now.   3. Leukocytosis: patient was not febrile. No source for infection was noted. Patient was not put on any anti-biotics. Her WBC has improved. This could have been due to dehydrated state.   4. ST depressions and prominent regional TW inversions on ECG: This was likely due to metabolic abnormalities. Patient ruled out for ACS by Troponins. ECHO was done which showed only mild AS. EF was normal. Will recommend PCP repeat EKG in office.   5. Acute renal insufficiency: Renal function improved with IV hydration.   6. HTN: May resume bisoprolol/HCTZ but will hold Demadex.  7. History of Depression: Stable. May resume home meds.  Patient has remained stable. She is feeling a bit constipated but otherwise denies any complaints. Has been able to ambulate to bathroom. Daughter plans to stay with patient for few days. Answered all their questions. She is stable for discharge. Will need close follow up with PCP.   PERTINENT LABS  Sodium was 133, Pot 2.3, BUN 3., Creat 1.42 at admission. All of these have come to normal range. WBC was 19.9 at admission and is down to 13.3. TSH was 2.6.   2D ECHOCARDIOGRAM  Study Conclusions  -  Left ventricle: The cavity size was normal. Wall thickness was normal. Systolic function was normal. The estimated ejection fraction was in the range of 60% to 65%. Wall motion was normal; there were no regional wall motion abnormalities. - Aortic valve: There was mild stenosis. Trivial regurgitation. - Mitral valve: Mild regurgitation.   IMAGING STUDIES Ct Head Wo  Contrast  09/04/2011  *RADIOLOGY REPORT*  Clinical Data:  Fall, head trauma, dizziness, neck trauma  CT HEAD WITHOUT CONTRAST CT CERVICAL SPINE WITHOUT CONTRAST  Technique:  Multidetector CT imaging of the head and cervical spine was performed following the standard protocol without intravenous contrast.  Multiplanar CT image reconstructions of the cervical spine were also generated.  Comparison:   None  CT HEAD  Findings: Minimal cortical volume loss noted with proportional ventricular prominence. No acute hemorrhage, acute infarction, or mass lesion is identified.  No midline shift.  No ventriculomegaly. No skull fracture.  Orbits and paranasal sinuses are intact.  IMPRESSION: No acute intracranial finding.  CT CERVICAL SPINE  Findings: C1 through the cervical thoracic junction is visualized in its entirety. No precervical soft tissue widening is present. Disc degenerative change is noted from C4-C7, most prominent at C5- C6 with mildly decreased vertebral body heights and intervertebral disc space, moderate uncovertebral joint hypertrophy and narrowing of the bilateral neural foramina.  No fracture or dislocation.  IMPRESSION: Mid cervical degenerative change, no acute osseous abnormality.  Original Report Authenticated By: Harrel Lemon, M.D.   Ct Cervical Spine Wo Contrast  09/04/2011  *RADIOLOGY REPORT*  Clinical Data:  Fall, head trauma, dizziness, neck trauma  CT HEAD WITHOUT CONTRAST CT CERVICAL SPINE WITHOUT CONTRAST  Technique:  Multidetector CT imaging of the head and cervical spine was performed following the standard protocol without intravenous contrast.  Multiplanar CT image reconstructions of the cervical spine were also generated.  Comparison:   None  CT HEAD  Findings: Minimal cortical volume loss noted with proportional ventricular prominence. No acute hemorrhage, acute infarction, or mass lesion is identified.  No midline shift.  No ventriculomegaly. No skull fracture.  Orbits and  paranasal sinuses are intact.  IMPRESSION: No acute intracranial finding.  CT CERVICAL SPINE  Findings: C1 through the cervical thoracic junction is visualized in its entirety. No precervical soft tissue widening is present. Disc degenerative change is noted from C4-C7, most prominent at C5- C6 with mildly decreased vertebral body heights and intervertebral disc space, moderate uncovertebral joint hypertrophy and narrowing of the bilateral neural foramina.  No fracture or dislocation.  IMPRESSION: Mid cervical degenerative change, no acute osseous abnormality.  Original Report Authenticated By: Harrel Lemon, M.D.   Dg Chest Portable 1 View  09/04/2011  *RADIOLOGY REPORT*  Clinical Data: Altered mental status, fall  PORTABLE CHEST - 1 VIEW  Comparison: 05/16/2009  Findings: The patient is rotated to the right.  Heart size is normal. Lung volumes are low with crowding of the bronchovascular markings. Diffusely increased interstitial lung markings noted, without focal pulmonary opacity.  No pleural effusion.  IMPRESSION: Low volumes with crowding of the bronchovascular markings and diffuse interstitial prominence but no focal acute finding.  Original Report Authenticated By: Harrel Lemon, M.D.    Discharge Exam: Blood pressure 108/64, pulse 65, temperature 98.3 F (36.8 C), temperature source Oral, resp. rate 20, height 5\' 5"  (1.651 m), weight 69.809 kg (153 lb 14.4 oz), SpO2 100.00%. General appearance: alert, cooperative, appears stated age and no distress Head: Normocephalic, without obvious abnormality, atraumatic Resp: clear to auscultation  bilaterally Cardio: regular rate and rhythm, S1, S2 normal, no murmur, click, rub or gallop GI: soft, non-tender; bowel sounds normal; no masses,  no organomegaly Extremities: extremities normal, atraumatic, no cyanosis or edema Skin: Skin color, texture, turgor normal. No rashes or lesions Neurologic: Grossly normal. No focal deficits.  Disposition:  Home  Discharge Orders    Future Orders Please Complete By Expires   Diet - low sodium heart healthy      Increase activity slowly      Discharge instructions      Comments:   Have your PCP check your potassium and Sodium levels next week.     Call MD for:      Comments:   Dizziness, chest pain, one sided weakness     Current Discharge Medication List    START taking these medications   Details  docusate sodium 100 MG CAPS Take 100 mg by mouth 2 (two) times daily. Qty: 60 capsule, Refills: 1    meclizine (ANTIVERT) 25 MG tablet Take 1 tablet (25 mg total) by mouth 3 (three) times daily as needed for dizziness. Qty: 30 tablet, Refills: 0    polyethylene glycol (MIRALAX / GLYCOLAX) packet Take 17 g by mouth daily as needed (constipation). Qty: 14 each, Refills: 1    potassium chloride (K-DUR) 10 MEQ tablet Take 2 tablets (20 mEq total) by mouth daily. Qty: 60 tablet, Refills: 3      CONTINUE these medications which have NOT CHANGED   Details  ALPRAZolam (XANAX) 1 MG tablet Take 1 mg by mouth 3 (three) times daily as needed. For anxiety.    bisoprolol-hydrochlorothiazide (ZIAC) 5-6.25 MG per tablet Take 1 tablet by mouth daily.    buPROPion (WELLBUTRIN XL) 150 MG 24 hr tablet Take 450 mg by mouth daily.    FLUoxetine (PROZAC) 40 MG capsule Take 40 mg by mouth daily.    lamoTRIgine (LAMICTAL) 25 MG tablet Take 25 mg by mouth daily.    mirtazapine (REMERON) 15 MG tablet Take 15 mg by mouth at bedtime.      STOP taking these medications     torsemide (DEMADEX) 20 MG tablet        Follow-up Information    Follow up with MCKEOWN,WILLIAM DAVID, MD. Schedule an appointment as soon as possible for a visit in 1 week. (post hospitalization follow up)    Contact information:   [id number] [place] [address] [phone]          Total Discharge Time: 35 mins  [facility]  [facility] Pager [phone]  09/06/2011, 9:09  AM

## 2011-09-06 NOTE — Progress Notes (Signed)
Talked to patient about HHC; List of [facility] agencies provided, patient chose [facility] for HHPT/OT; Norberta Keens RN with [facility] called for arrangements. Abelino Derrick RN, BSN, [place].

## 2012-05-09 ENCOUNTER — Other Ambulatory Visit (HOSPITAL_COMMUNITY): Payer: Self-pay | Admitting: Internal Medicine

## 2012-05-09 DIAGNOSIS — Z1231 Encounter for screening mammogram for malignant neoplasm of breast: Secondary | ICD-10-CM

## 2012-06-09 ENCOUNTER — Ambulatory Visit (HOSPITAL_COMMUNITY)
Admission: RE | Admit: 2012-06-09 | Discharge: 2012-06-09 | Disposition: A | Discharge status: Home / Self Care | Payer: Medicare Other | Source: Ambulatory Visit | Attending: Internal Medicine | Admitting: Internal Medicine

## 2012-06-09 DIAGNOSIS — Z1231 Encounter for screening mammogram for malignant neoplasm of breast: Secondary | ICD-10-CM | POA: Insufficient documentation

## 2013-03-31 ENCOUNTER — Encounter: Payer: Self-pay | Admitting: Internal Medicine

## 2013-03-31 DIAGNOSIS — I10 Essential (primary) hypertension: Secondary | ICD-10-CM | POA: Insufficient documentation

## 2013-03-31 DIAGNOSIS — F1020 Alcohol dependence, uncomplicated: Secondary | ICD-10-CM | POA: Insufficient documentation

## 2013-03-31 DIAGNOSIS — K589 Irritable bowel syndrome without diarrhea: Secondary | ICD-10-CM | POA: Insufficient documentation

## 2013-03-31 DIAGNOSIS — K219 Gastro-esophageal reflux disease without esophagitis: Secondary | ICD-10-CM | POA: Insufficient documentation

## 2013-03-31 DIAGNOSIS — R7309 Other abnormal glucose: Secondary | ICD-10-CM | POA: Insufficient documentation

## 2013-03-31 DIAGNOSIS — E782 Mixed hyperlipidemia: Secondary | ICD-10-CM | POA: Insufficient documentation

## 2013-03-31 DIAGNOSIS — F319 Bipolar disorder, unspecified: Secondary | ICD-10-CM | POA: Insufficient documentation

## 2013-03-31 DIAGNOSIS — E785 Hyperlipidemia, unspecified: Secondary | ICD-10-CM | POA: Insufficient documentation

## 2013-04-01 ENCOUNTER — Encounter: Payer: Self-pay | Admitting: Internal Medicine

## 2013-04-01 ENCOUNTER — Ambulatory Visit: Payer: Medicare Other | Admitting: Internal Medicine

## 2013-04-01 VITALS — BP 116/74 | HR 60 | Temp 98.1°F | Resp 18 | Ht 64.5 in | Wt 161.8 lb

## 2013-04-01 DIAGNOSIS — E782 Mixed hyperlipidemia: Secondary | ICD-10-CM

## 2013-04-01 DIAGNOSIS — R7309 Other abnormal glucose: Secondary | ICD-10-CM

## 2013-04-01 DIAGNOSIS — E559 Vitamin D deficiency, unspecified: Secondary | ICD-10-CM

## 2013-04-01 DIAGNOSIS — I10 Essential (primary) hypertension: Secondary | ICD-10-CM

## 2013-04-01 DIAGNOSIS — Z0000 Encounter for general adult medical examination without abnormal findings: Secondary | ICD-10-CM

## 2013-04-01 DIAGNOSIS — Z1212 Encounter for screening for malignant neoplasm of rectum: Secondary | ICD-10-CM

## 2013-04-01 DIAGNOSIS — Z79899 Other long term (current) drug therapy: Secondary | ICD-10-CM

## 2013-04-01 MED ORDER — SERTRALINE HCL 100 MG PO TABS: 200.0000 mg | ORAL_TABLET | Freq: Every day | ORAL | Status: AC

## 2013-04-01 MED ORDER — ESOMEPRAZOLE MAGNESIUM 40 MG PO CPDR: 40.0000 mg | DELAYED_RELEASE_CAPSULE | Freq: Every day | ORAL | Status: DC

## 2013-04-01 NOTE — Progress Notes (Signed)
Patient ID: Angelica Kelley, female   DOB: 1945/07/23, 67 y.o.   MRN: [id number]  Annual Screening Comprehensive Examination  This very nice 67 yo [name] presents for complete physical.  Patient has been followed for HTN, Prediabetes, Hyperlipidemia and Vitamin D Deficiency.   Patient's BP has been controlled at home. Patient denies any cardiac Symptoms as chest pain, palpitations, shortness of breath, dizziness or ankle swelling.   Patient's hyperlipidemia is controlled with diet and medications. Cholesterol last visit was 157, HDL 48, and LDL 85 at goal. Patient denies myalgias or other medication SE's.      Patient has prediabetes/insulin resistance with last A1c of 5.2% in May (A1c was 6.1% in 2012). Patient denies reactive hypoglycemic symptoms, visual blurring, diabetic polys, or paresthesias.    Patient is followed by Dr Evelene Croon for chronic anxiety and depression and currently is doing very well    Finally, patient has history of Vitamin D Deficiency with last vitamin D 81 in May (was 13 in 2010).     Current outpatient prescriptions Iron-DSS-B12-FA-C-E-Cu-Biotin (HEMAX) 150-1 MG TABS, Take 150 mg by mouth daily  ALPRAZolam (XANAX) 1 MG tablet, Take 1 mg by mouth 3 (three) times For anxiety aspirin 81 MG tablet, Take 81 mg by mouth daily bisoprolol-hydrochlorothiazide (ZIAC) 5-6.25 MG Take 1 tablet by mouth daily buPROPion (WELLBUTRIN XL) 150 MG Take 3 tab = 450 mg by mouth dail Cholecalciferol (VITAMIN D3) 5000 UNITS CAPS, Take by mouth daily esomeprazole (NEXIUM) 40 MG capsule, Take 1 capsule (40 mg total) by mouth daily hydrochlorothiazide (MICROZIDE) 12.5 MG capsule, Take 12.5 mg by mouth daily lamoTRIgine (LAMICTAL) 25 MG tablet, Take 25 mg by mouth daily rosuvastatin (CRESTOR) 40 MG tablet, Take 40 mg by mouth daily Sertraline (ZOLOFT) 100 MG tablet, Take 2 tablets (200 mg total) by mouth daily @IMMTABLE @  Allergies  Allergen Reactions  . Lipitor [Atorvastatin]     Fatigue     Past Medical History  Diagnosis Date  . Hypertension   . Hyperlipidemia   . GERD (gastroesophageal reflux disease)   . Anxiety   . Depression   . Alcoholism   . IBS (irritable bowel syndrome)   . Elevated hemoglobin A1c     Past Surgical History  Procedure Laterality Date  . Neck surgery  Remote     Ruptured disc, per patient. Got infected.   . Abdominal hysterectomy  1991  . Breast surgery Left 1989    Biospy  . Tonsillectomy  1965  . Cervical laminectomy  1977    Family History  Problem Relation Age of Onset  . Heart disease Mother   . Diabetes Mother   . Leukemia Father   . Heart disease Brother   . Cancer Brother   . Parkinson's disease Brother     History   Social History  . Marital Status: Divorced    Spouse Name: N/A    Number of Children: N/A  . Years of Education: N/A   Occupational History  . 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 Smoking status: Never Smoker   . Smokeless tobacco: Not on file  . Alcohol Use: No  . Drug Use: Not on file  . Sexual Activity: Not on file    Social History Narrative   Pt's niece, phone number [phone]. Pt lives alone at home, and doesn't use a cane or walker, is still pretty active. Never smoker.     ROS Constitutional: Denies  fever, chills, weight loss/gain, headaches, insomnia, fatigue, night sweats, and change in appetite. Eyes: Denies redness, blurred vision, diplopia, discharge, itchy, watery eyes.  ENT: Denies discharge, congestion, post nasal drip, epistaxis, sore throat, earache, hearing loss, dental pain, Tinnitus, Vertigo, Sinus pain, snoring.  Cardio: Denies chest pain, palpitations, irregular heartbeat, syncope, dyspnea, diaphoresis, orthopnea, PND, claudication, edema Respiratory: denies cough, dyspnea, DOE, pleurisy, hoarseness, laryngitis, wheezing.  Gastrointestinal: Denies dysphagia, heartburn, reflux, water brash, pain, cramps, nausea,  vomiting, bloating, diarrhea, constipation, hematemesis, melena, hematochezia, jaundice, hemorrhoids Genitourinary: Denies dysuria, frequency, urgency, nocturia, hesitancy, discharge, hematuria, flank pain Breast:Breast lumps, nipple discharge, bleeding.  Musculoskeletal: Denies arthralgia, myalgia, stiffness, Jt. Swelling, pain, limp, and strain/sprain. Skin: Denies puritis, rash, hives, warts, acne, eczema, changing in skin lesion Neuro: Weakness, tremor, incoordination, spasms, paresthesia, pain Psychiatric: Denies confusion, memory loss, sensory loss Endocrine: Denies change in weight, skin, hair change, nocturia, and paresthesia, diabetic polys, visual blurring, hyper /hypo glycemic episodes.  Heme/Lymph: No excessive bleeding, bruising, enlarged lymph nodes.  Filed Vitals:   04/01/13 1539  BP: 116/74  Pulse: 60  Temp: 98.1 F (36.7 C)  Resp: 18   Body mass index is 27.35 kg/(m^2).  Physical Exam  General Appearance: Well nourished, in no apparent distress. Eyes: PERRLA, EOMs, conjunctiva no swelling or erythema, normal fundi and vessels. Sinuses: No frontal/maxillary tenderness ENT/Mouth: EACs patent / TMs  nl. Nares clear without erythema, swelling, mucoid exudates. Oral hygiene is good. No erythema, swelling, or exudate. Tongue normal, non-obstructing. Tonsils not swollen or erythematous. Hearing normal.  Neck: Supple, thyroid normal. No bruits, nodes or JVD. Respiratory: Respiratory effort normal.  BS equal and clear bilateral without rales, rhonci, wheezing or stridor. Cardio: Heart sounds are normal with regular rate and rhythm and no murmurs, rubs or gallops. Peripheral pulses are normal and equal bilaterally without edema. No aortic or femoral bruits. Chest: symmetric with normal excursions and percussion. Breasts: Symmetric, without lumps, nipple discharge, retractions, or fibrocystic changes.  Abdomen: Flat, soft, with bowl sounds. Nontender, no guarding, rebound,  hernias, masses, or organomegaly.  Lymphatics: Non tender without lymphadenopathy.  Musculoskeletal: Full ROM all peripheral extremities, joint stability, 5/5 strength, and normal gait. Skin: Warm and dry without rashes, lesions, cyanosis, clubbing or  ecchymosis.  Neuro: Cranial nerves intact, reflexes equal bilaterally. Normal muscle tone, no cerebellar symptoms. Sensation intact.  Pysch: Awake and oriented X 3, normal affect, Insight and Judgment appropriate.   Assessment and Plan  1. Annual Screening Examination 2. Hypertension  3. Hyperlipidemia 4. Pre Diabetes 5. Vitamin D Deficiency 6. Depression  Continue prudent diet as discussed, weight control, regular exercise, and medications. Routine screening labs and tests as requested with regular follow-up as recommended.

## 2013-04-01 NOTE — Patient Instructions (Signed)

## 2013-04-02 LAB — HEPATIC FUNCTION PANEL
ALT: 12 U/L (ref 0–35)
Bilirubin, Direct: 0.1 mg/dL (ref 0.0–0.3)
Indirect Bilirubin: 0.4 mg/dL (ref 0.0–0.9)
Total Bilirubin: 0.5 mg/dL (ref 0.3–1.2)

## 2013-04-02 LAB — URINALYSIS, MICROSCOPIC ONLY
Bacteria, UA: NONE SEEN
Crystals: NONE SEEN

## 2013-04-02 LAB — BASIC METABOLIC PANEL WITH GFR
GFR, Est African American: 73 mL/min
Glucose, Bld: 92 mg/dL (ref 70–99)
Potassium: 4.5 mEq/L (ref 3.5–5.3)
Sodium: 141 mEq/L (ref 135–145)

## 2013-04-02 LAB — TSH: TSH: 1.301 u[IU]/mL (ref 0.350–4.500)

## 2013-04-02 LAB — LIPID PANEL
HDL: 56 mg/dL (ref >39)
LDL Cholesterol: 98 mg/dL (ref 0–99)
Total CHOL/HDL Ratio: 3.3 Ratio
Triglycerides: 146 mg/dL (ref <150)
VLDL: 29 mg/dL (ref 0–40)

## 2013-04-02 LAB — MICROALBUMIN / CREATININE URINE RATIO
Creatinine, Urine: 229.6 mg/dL
Microalb Creat Ratio: 5.9 mg/g (ref 0.0–30.0)

## 2013-04-02 LAB — CBC WITH DIFFERENTIAL/PLATELET
Basophils Relative: 1 % (ref 0–1)
Hemoglobin: 14.5 g/dL (ref 12.0–15.0)
Lymphs Abs: 1.3 10*3/uL (ref 0.7–4.0)
MCHC: 34.2 g/dL (ref 30.0–36.0)
Monocytes Relative: 8 % (ref 3–12)
Neutro Abs: 4.2 10*3/uL (ref 1.7–7.7)
Neutrophils Relative %: 68 % (ref 43–77)
Platelets: 248 10*3/uL (ref 150–400)
RBC: 4.87 MIL/uL (ref 3.87–5.11)

## 2013-04-02 LAB — INSULIN, FASTING: Insulin fasting, serum: 6 u[IU]/mL (ref 3–28)

## 2013-04-02 LAB — LAMOTRIGINE LEVEL: Lamotrigine Lvl: 8.9 ug/mL (ref 3.0–14.0)

## 2013-04-15 ENCOUNTER — Other Ambulatory Visit (INDEPENDENT_AMBULATORY_CARE_PROVIDER_SITE_OTHER): Payer: Medicare Other | Admitting: Internal Medicine

## 2013-04-15 DIAGNOSIS — Z1212 Encounter for screening for malignant neoplasm of rectum: Secondary | ICD-10-CM

## 2013-04-15 LAB — POC HEMOCCULT BLD/STL (HOME/3-CARD/SCREEN)
Card #2 Fecal Occult Blod, POC: NEGATIVE
Card #3 Fecal Occult Blood, POC: NEGATIVE
Fecal Occult Blood, POC: NEGATIVE

## 2013-04-25 ENCOUNTER — Other Ambulatory Visit: Payer: Self-pay | Admitting: Internal Medicine

## 2013-07-13 ENCOUNTER — Ambulatory Visit (INDEPENDENT_AMBULATORY_CARE_PROVIDER_SITE_OTHER): Payer: Medicare Other | Admitting: Physician Assistant

## 2013-07-13 ENCOUNTER — Encounter: Payer: Self-pay | Admitting: Physician Assistant

## 2013-07-13 VITALS — BP 134/80 | HR 64 | Temp 98.1°F | Resp 16 | Ht 65.75 in | Wt 160.0 lb

## 2013-07-13 DIAGNOSIS — K219 Gastro-esophageal reflux disease without esophagitis: Secondary | ICD-10-CM

## 2013-07-13 DIAGNOSIS — Z0000 Encounter for general adult medical examination without abnormal findings: Secondary | ICD-10-CM

## 2013-07-13 DIAGNOSIS — F329 Major depressive disorder, single episode, unspecified: Secondary | ICD-10-CM

## 2013-07-13 DIAGNOSIS — F419 Anxiety disorder, unspecified: Secondary | ICD-10-CM

## 2013-07-13 DIAGNOSIS — E785 Hyperlipidemia, unspecified: Secondary | ICD-10-CM

## 2013-07-13 DIAGNOSIS — R7309 Other abnormal glucose: Secondary | ICD-10-CM

## 2013-07-13 DIAGNOSIS — Z79899 Other long term (current) drug therapy: Secondary | ICD-10-CM

## 2013-07-13 DIAGNOSIS — I10 Essential (primary) hypertension: Secondary | ICD-10-CM

## 2013-07-13 DIAGNOSIS — F32A Depression, unspecified: Secondary | ICD-10-CM

## 2013-07-13 LAB — MAGNESIUM: Magnesium: 2.1 mg/dL (ref 1.5–2.5)

## 2013-07-13 LAB — CBC WITH DIFFERENTIAL/PLATELET
BASOS PCT: 1 % (ref 0–1)
Basophils Absolute: 0.1 10*3/uL (ref 0.0–0.1)
Eosinophils Absolute: 0.1 10*3/uL (ref 0.0–0.7)
Eosinophils Relative: 1 % (ref 0–5)
HCT: 42 % (ref 36.0–46.0)
Hemoglobin: 14.2 g/dL (ref 12.0–15.0)
Lymphocytes Relative: 24 % (ref 12–46)
Lymphs Abs: 1.5 10*3/uL (ref 0.7–4.0)
MCH: 29.7 pg (ref 26.0–34.0)
MCHC: 33.8 g/dL (ref 30.0–36.0)
MCV: 87.9 fL (ref 78.0–100.0)
Monocytes Absolute: 0.4 10*3/uL (ref 0.1–1.0)
Monocytes Relative: 6 % (ref 3–12)
NEUTROS ABS: 4.1 10*3/uL (ref 1.7–7.7)
NEUTROS PCT: 68 % (ref 43–77)
PLATELETS: 199 10*3/uL (ref 150–400)
RBC: 4.78 MIL/uL (ref 3.87–5.11)
RDW: 13.2 % (ref 11.5–15.5)
WBC: 6.1 10*3/uL (ref 4.0–10.5)

## 2013-07-13 LAB — BASIC METABOLIC PANEL WITH GFR
BUN: 17 mg/dL (ref 6–23)
CO2: 30 mEq/L (ref 19–32)
Calcium: 10 mg/dL (ref 8.4–10.5)
Chloride: 104 mEq/L (ref 96–112)
Creat: 1.02 mg/dL (ref 0.50–1.10)
GFR, EST AFRICAN AMERICAN: 65 mL/min
GFR, Est Non African American: 56 mL/min — ABNORMAL LOW
Glucose, Bld: 91 mg/dL (ref 70–99)
POTASSIUM: 5 meq/L (ref 3.5–5.3)
Sodium: 143 mEq/L (ref 135–145)

## 2013-07-13 LAB — HEPATIC FUNCTION PANEL
ALBUMIN: 4.9 g/dL (ref 3.5–5.2)
ALT: 16 U/L (ref 0–35)
AST: 19 U/L (ref 0–37)
Alkaline Phosphatase: 43 U/L (ref 39–117)
BILIRUBIN DIRECT: 0.1 mg/dL (ref 0.0–0.3)
BILIRUBIN TOTAL: 0.5 mg/dL (ref 0.2–1.2)
Indirect Bilirubin: 0.4 mg/dL (ref 0.2–1.2)
Total Protein: 6.7 g/dL (ref 6.0–8.3)

## 2013-07-13 LAB — LIPID PANEL
CHOLESTEROL: 175 mg/dL (ref 0–200)
HDL: 51 mg/dL (ref >39)
LDL Cholesterol: 104 mg/dL — ABNORMAL HIGH (ref 0–99)
Total CHOL/HDL Ratio: 3.4 Ratio
Triglycerides: 102 mg/dL (ref <150)
VLDL: 20 mg/dL (ref 0–40)

## 2013-07-13 LAB — TSH: TSH: 1.713 u[IU]/mL (ref 0.350–4.500)

## 2013-07-13 NOTE — Patient Instructions (Addendum)

## 2013-07-13 NOTE — Progress Notes (Signed)
Subjective:   Angelica Kelley is a 68 y.o. female who presents for Medicare Annual Wellness Visit and 3 month follow up on hypertension, prediabetes, hyperlipidemia, vitamin D def.  Date of last medicare wellness visit is unknown.   Her blood pressure has been controlled at home, today their BP is BP: 134/80 mmHg She denies chest pain, shortness of breath, dizziness.  Her cholesterol is diet controlled. In addition they are on crestor and denies myalgias. Her cholesterol is controlled. The cholesterol last visit was:   Lab Results  Component Value Date   CHOL 183 04/01/2013   HDL 56 04/01/2013   LDLCALC 98 04/01/2013   TRIG 146 04/01/2013   CHOLHDL 3.3 04/01/2013   She has been working on diet and exercise for prediabetes, and has no polyuria or polydipsia, no chest pain, dyspnea or TIAs, no numbness, tingling or pain in extremities. Last A1C in the office was:  Lab Results  Component Value Date   HGBA1C 5.5 04/01/2013   Patient is on Vitamin D supplement.   Names of Other Physician/Practitioners you currently use: 1. [facility]- here for primary care 2. Dr. Pandora Leiter, eye doctor, last visit 06/2013 3. Dr. Vanessa Kick, dentist, last visit 02/2014 Dr. Earlean Shawl Patient Care Team: Unk Pinto, MD as PCP - General (Internal Medicine)   Medication Review Current Outpatient Prescriptions on File Prior to Visit  Medication Sig Dispense Refill  . ALPRAZolam (XANAX) 1 MG tablet Take 1 mg by mouth 3 (three) times daily as needed. For anxiety.      Marland Kitchen aspirin 81 MG tablet Take 81 mg by mouth daily.      . bisoprolol-hydrochlorothiazide (ZIAC) 5-6.25 MG per tablet Take 1 tablet by mouth daily.      Marland Kitchen buPROPion (WELLBUTRIN XL) 150 MG 24 hr tablet Take 450 mg by mouth daily.      . Cholecalciferol (VITAMIN D3) 5000 UNITS CAPS Take by mouth daily.      . hydrochlorothiazide (MICROZIDE) 12.5 MG capsule Take 12.5 mg by mouth daily.      .  Iron-DSS-B12-FA-C-E-Cu-Biotin (HEMAX) 150-1 MG TABS Take 150 mg by mouth daily.      Marland Kitchen lamoTRIgine (LAMICTAL) 25 MG tablet Take 25 mg by mouth daily.      Marland Kitchen NEXIUM 40 MG capsule take 1 capsule by mouth once daily  30 capsule  PRN  . rosuvastatin (CRESTOR) 40 MG tablet Take 40 mg by mouth daily.      . sertraline (ZOLOFT) 100 MG tablet Take 2 tablets (200 mg total) by mouth daily.  30 tablet  2   No current facility-administered medications on file prior to visit.    Current Problems (verified) Patient Active Problem List   Diagnosis Date Noted  . Hypertension   . Hyperlipidemia   . GERD (gastroesophageal reflux disease)   . Anxiety   . Depression   . Alcoholism   . IBS (irritable bowel syndrome)   . Elevated hemoglobin A1c   . Abnormal ECG 09/05/2011    Screening Tests Health Maintenance  Topic Date Due  . Colonoscopy  07/20/1993  . Pneumococcal Polysaccharide Vaccine Age 25 And Over  07/20/2008  . Influenza Vaccine  12/12/2012  . Mammogram  06/09/2014  . Tetanus/tdap  05/15/2015  . Zostavax  Completed    Health Maintenance Topics with due status: Overdue     Topic Date Due   COLONOSCOPY 07/20/1993   PNEUMOCOCCAL POLYSACCHARIDE VACCINE AGE 83 AND OVER 07/20/2008   INFLUENZA VACCINE  12/12/2012    Immunization History  Administered Date(s) Administered  . Influenza-Unspecified 03/15/2011  . Pneumococcal-Unspecified 05/14/2008  . Td 05/14/2005  . Zoster 01/23/2012    Preventative care: Last colonoscopy: 03/2012 due 2018 Last mammogram: 06/09/2012 DUE Last pap smear/pelvic exam: [facility]  DEXA:2011  Prior vaccinations: TD or Tdap: 2007  Influenza: 02/2013 Pneumococcal: 2010 Shingles/Zostavax: 2013  History reviewed: allergies, current medications, past family history, past medical history, past social history, past surgical history and problem list  Risk Factors: Osteoporosis: postmenopausal estrogen deficiency, dietary calcium and/or vitamin D deficiency and  amenorrhea History of fracture in the past year: no  Tobacco History  Smoking status  . Never Smoker   Smokeless tobacco  . Not on file   She does not smoke.  Patient is not a former smoker. Are there smokers in your home (other than you)?  No  Alcohol Current alcohol use: history of alcohol abuse 5 years  Caffeine Current caffeine use: coffee 2 /day  Exercise Exercise limitations: The patient has no exercise limitations. Current exercise: bicycling and walking  Nutrition/Diet Current diet: in general, a "healthy" diet    Cardiac risk factors: advanced age (older than 88 for men, 55 for women), dyslipidemia, family history of premature cardiovascular disease and hypertension.  Depression Screen Nurse depression screen reviewed.  (Note: if answer to either of the following is "Yes", a more complete depression screening is indicated)   Q1: Over the past two weeks, have you felt down, depressed or hopeless? No  Q2: Over the past two weeks, have you felt little interest or pleasure in doing things? No  Have you lost interest or pleasure in daily life? No  Do you often feel hopeless? No  Do you cry easily over simple problems? No  Activities of Daily Living Nurse ADLs screen reviewed.  In your present state of health, do you have any difficulty performing the following activities?:  Driving? No Managing money?  No Feeding yourself? No Getting from bed to chair? No Climbing a flight of stairs? No Preparing food and eating?: No Bathing or showering? No Getting dressed: No Getting to the toilet? No Using the toilet:No Moving around from place to place: No In the past year have you fallen or had a near fall?:No   Are you sexually active?  No  Do you have more than one partner?  No  Vision Difficulties: No  Hearing Difficulties: No Do you often ask people to speak up or repeat themselves? No Do you experience ringing or noises in your ears? No Do you have  difficulty understanding soft or whispered voices? No  Cognition  Do you feel that you have a problem with memory? No  Do you often misplace items? No  Do you feel safe at home?  Yes  Advanced directives Does patient have a [place]? Yes Does patient have a Living Will? Yes    Objective:     Vision and hearing screens reviewed.   Blood pressure 134/80, pulse 64, temperature 98.1 F (36.7 C), temperature source Temporal, resp. rate 16, height 5' 5.75" (1.67 m), weight 160 lb (72.576 kg). Body mass index is 26.02 kg/(m^2).  General appearance: alert, no distress, WD/WN,  female Cognitive Testing  Alert? Yes  Normal Appearance?Yes  Oriented to person? Yes  Place? Yes   Time? Yes  Recall of three objects?  Yes  Can perform simple calculations? Yes  Displays appropriate judgment?Yes  Can read the correct time from a watch  face?Yes  HEENT: normocephalic, sclerae anicteric, TMs pearly, nares patent, no discharge or erythema, pharynx normal Oral cavity: MMM, no lesions Neck: supple, no lymphadenopathy, no thyromegaly, no masses Heart: RRR, normal S1, S2, no murmurs Lungs: CTA bilaterally, no wheezes, rhonchi, or rales Abdomen: +bs, soft, non tender, non distended, no masses, no hepatomegaly, no splenomegaly Musculoskeletal: nontender, no swelling, no obvious deformity Extremities: no edema, no cyanosis, no clubbing Pulses: 2+ symmetric, upper and lower extremities, normal cap refill Neurological: alert, oriented x 3, CN2-12 intact, strength normal upper extremities and lower extremities, sensation normal throughout, DTRs 2+ throughout, no cerebellar signs, gait normal Psychiatric: normal affect, behavior normal, pleasant  Breast: defer Gyn: refer Rectal: defer   Assessment:   1. Hypertension - CBC with Differential - Hepatic function panel - BASIC METABOLIC PANEL WITH GFR - TSH  2. GERD (gastroesophageal reflux disease)  3. Hyperlipidemia - Lipid  panel  4. Anxiety  5. Depression  6. Elevated hemoglobin A1c  7. Encounter for long-term (current) use of other medications - Magnesium   Plan:   During the course of the visit the patient was educated and counseled about appropriate screening and preventive services including:    Influenza vaccine  Screening electrocardiogram  Screening mammography  Bone densitometry screening  Colorectal cancer screening  Diabetes screening  Glaucoma screening  Nutrition counseling   Screening recommendations, referrals: Vaccinations: Tdap vaccine not done  Influenza vaccine not done Pneumococcal vaccine not done Shingles vaccine not done Hep B vaccine not done  Nutrition assessed and recommended  Colonoscopy not done Mammogram yes Pap smear no Pelvic exam no Recommended yearly ophthalmology/optometry visit for glaucoma screening and checkup Recommended yearly dental visit for hygiene and checkup Advanced directives - not done  Conditions/risks identified: BMI: Discussed weight loss, diet, and increase physical activity.  Increase physical activity: AHA recommends 150 minutes of physical activity a week.  Medications reviewed MGM DEXA- requested Preiabetes is at goal Urinary Incontinence is not an issue: discussed non pharmacology and pharmacology options. Bladder book given Fall risk: moderate- discussed PT, home fall assessment, medications.   Medicare Attestation I have personally reviewed: The patient's medical and social history Their use of alcohol, tobacco or illicit drugs Their current medications and supplements The patient's functional ability including ADLs,fall risks, home safety risks, cognitive, and hearing and visual impairment Diet and physical activities Evidence for depression or mood disorders  The patient's weight, height, BMI, and visual acuity have been recorded in the chart.  I have made referrals, counseling, and provided education to the  patient based on review of the above and I have provided the patient with a written personalized care plan for preventive services.     Quentin MullingCollier, Aminah Zabawa, PA-C   07/13/2013

## 2013-09-09 ENCOUNTER — Ambulatory Visit (INDEPENDENT_AMBULATORY_CARE_PROVIDER_SITE_OTHER): Payer: Medicare Other | Admitting: Physician Assistant

## 2013-09-09 ENCOUNTER — Encounter: Payer: Self-pay | Admitting: Physician Assistant

## 2013-09-09 VITALS — BP 138/78 | HR 64 | Temp 97.9°F | Resp 18 | Wt 159.4 lb

## 2013-09-09 DIAGNOSIS — M25562 Pain in left knee: Secondary | ICD-10-CM

## 2013-09-09 DIAGNOSIS — N3000 Acute cystitis without hematuria: Secondary | ICD-10-CM

## 2013-09-09 DIAGNOSIS — M545 Low back pain, unspecified: Secondary | ICD-10-CM

## 2013-09-09 DIAGNOSIS — M25569 Pain in unspecified knee: Secondary | ICD-10-CM

## 2013-09-09 MED ORDER — DICLOFENAC SODIUM 75 MG PO TBEC: DELAYED_RELEASE_TABLET | ORAL | Status: DC

## 2013-09-09 MED ORDER — TRAMADOL HCL 50 MG PO TABS: 50.0000 mg | ORAL_TABLET | Freq: Three times a day (TID) | ORAL | Status: DC | PRN

## 2013-09-09 NOTE — Patient Instructions (Signed)
Knee Exercises EXERCISES RANGE OF MOTION(ROM) AND STRETCHING EXERCISES These exercises may help you when beginning to rehabilitate your injury. Your symptoms may resolve with or without further involvement from your physician, physical therapist or athletic trainer. While completing these exercises, remember:   Restoring tissue flexibility helps normal motion to return to the joints. This allows healthier, less painful movement and activity.  An effective stretch should be held for at least 30 seconds.  A stretch should never be painful. You should only feel a gentle lengthening or release in the stretched tissue. STRETCH - Knee Extension, Prone  Lie on your stomach on a firm surface, such as a bed or countertop. Place your right / left knee and leg just beyond the edge of the surface. You may wish to place a towel under the far end of your right / left thigh for comfort.  Relax your leg muscles and allow gravity to straighten your knee. Your clinician may advise you to add an ankle weight if more resistance is helpful for you.  You should feel a stretch in the back of your right / left knee. Hold this position for __________ seconds. Repeat __________ times. Complete this stretch __________ times per day. * Your physician, physical therapist or athletic trainer may ask you to add ankle weight to enhance your stretch.  RANGE OF MOTION - Knee Flexion, Active  Lie on your back with both knees straight. (If this causes back discomfort, bend your opposite knee, placing your foot flat on the floor.)  Slowly slide your heel back toward your buttocks until you feel a gentle stretch in the front of your knee or thigh.  Hold for __________ seconds. Slowly slide your heel back to the starting position. Repeat __________ times. Complete this exercise __________ times per day.  STRETCH - Quadriceps, Prone   Lie on your stomach on a firm surface, such as a bed or padded floor.  Bend your right /  left knee and grasp your ankle. If you are unable to reach, your ankle or pant leg, use a belt around your foot to lengthen your reach.  Gently pull your heel toward your buttocks. Your knee should not slide out to the side. You should feel a stretch in the front of your thigh and/or knee.  Hold this position for __________ seconds. Repeat __________ times. Complete this stretch __________ times per day.  STRETCH  Hamstrings, Supine   Lie on your back. Loop a belt or towel over the ball of your right / left foot.  Straighten your right / left knee and slowly pull on the belt to raise your leg. Do not allow the right / left knee to bend. Keep your opposite leg flat on the floor.  Raise the leg until you feel a gentle stretch behind your right / left knee or thigh. Hold this position for __________ seconds. Repeat __________ times. Complete this stretch __________ times per day.  STRENGTHENING EXERCISES These exercises may help you when beginning to rehabilitate your injury. They may resolve your symptoms with or without further involvement from your physician, physical therapist or athletic trainer. While completing these exercises, remember:   Muscles can gain both the endurance and the strength needed for everyday activities through controlled exercises.  Complete these exercises as instructed by your physician, physical therapist or athletic trainer. Progress the resistance and repetitions only as guided.  You may experience muscle soreness or fatigue, but the pain or discomfort you are trying to eliminate should   never worsen during these exercises. If this pain does worsen, stop and make certain you are following the directions exactly. If the pain is still present after adjustments, discontinue the exercise until you can discuss the trouble with your clinician. STRENGTH - Quadriceps, Isometrics  Lie on your back with your right / left leg extended and your opposite knee bent.  Gradually  tense the muscles in the front of your right / left thigh. You should see either your knee cap slide up toward your hip or increased dimpling just above the knee. This motion will push the back of the knee down toward the floor/mat/bed on which you are lying.  Hold the muscle as tight as you can without increasing your pain for __________ seconds.  Relax the muscles slowly and completely in between each repetition. Repeat __________ times. Complete this exercise __________ times per day.  STRENGTH - Quadriceps, Short Arcs   Lie on your back. Place a __________ inch towel roll under your knee so that the knee slightly bends.  Raise only your lower leg by tightening the muscles in the front of your thigh. Do not allow your thigh to rise.  Hold this position for __________ seconds. Repeat __________ times. Complete this exercise __________ times per day.  OPTIONAL ANKLE WEIGHTS: Begin with ____________________, but DO NOT exceed ____________________. Increase in 1 pound/0.5 kilogram increments.  STRENGTH - Quadriceps, Straight Leg Raises  Quality counts! Watch for signs that the quadriceps muscle is working to insure you are strengthening the correct muscles and not "cheating" by substituting with healthier muscles.  Lay on your back with your right / left leg extended and your opposite knee bent.  Tense the muscles in the front of your right / left thigh. You should see either your knee cap slide up or increased dimpling just above the knee. Your thigh may even quiver.  Tighten these muscles even more and raise your leg 4 to 6 inches off the floor. Hold for __________ seconds.  Keeping these muscles tense, lower your leg.  Relax the muscles slowly and completely in between each repetition. Repeat __________ times. Complete this exercise __________ times per day.  STRENGTH - Hamstring, Curls  Lay on your stomach with your legs extended. (If you lay on a bed, your feet may hang over the  edge.)  Tighten the muscles in the back of your thigh to bend your right / left knee up to 90 degrees. Keep your hips flat on the bed/floor.  Hold this position for __________ seconds.  Slowly lower your leg back to the starting position. Repeat __________ times. Complete this exercise __________ times per day.  OPTIONAL ANKLE WEIGHTS: Begin with ____________________, but DO NOT exceed ____________________. Increase in 1 pound/0.5 kilogram increments.  STRENGTH  Quadriceps, Squats  Stand in a door frame so that your feet and knees are in line with the frame.  Use your hands for balance, not support, on the frame.  Slowly lower your weight, bending at the hips and knees. Keep your lower legs upright so that they are parallel with the door frame. Squat only within the range that does not increase your knee pain. Never let your hips drop below your knees.  Slowly return upright, pushing with your legs, not pulling with your hands. Repeat __________ times. Complete this exercise __________ times per day.  STRENGTH - Quadriceps, Wall Slides  Follow guidelines for form closely. Increased knee pain often results from poorly placed feet or knees.  Lean against   a smooth wall or door and walk your feet out 18-24 inches. Place your feet hip-width apart.  Slowly slide down the wall or door until your knees bend __________ degrees.* Keep your knees over your heels, not your toes, and in line with your hips, not falling to either side.  Hold for __________ seconds. Stand up to rest for __________ seconds in between each repetition. Repeat __________ times. Complete this exercise __________ times per day. * Your physician, physical therapist or athletic trainer will alter this angle based on your symptoms and progress. Document Released: 03/14/2005 Document Revised: 07/23/2011 Document Reviewed: 08/12/2008 [facility] Patient Information 2014 [place], [place]. Back Exercises Back exercises help treat  and prevent back injuries. The goal of back exercises is to increase the strength of your abdominal and back muscles and the flexibility of your back. These exercises should be started when you no longer have back pain. Back exercises include:  Pelvic Tilt. Lie on your back with your knees bent. Tilt your pelvis until the lower part of your back is against the floor. Hold this position 5 to 10 sec and repeat 5 to 10 times.  Knee to Chest. Pull first 1 knee up against your chest and hold for 20 to 30 seconds, repeat this with the other knee, and then both knees. This may be done with the other leg straight or bent, whichever feels better.  Sit-Ups or Curl-Ups. Bend your knees 90 degrees. Start with tilting your pelvis, and do a partial, slow sit-up, lifting your trunk only 30 to 45 degrees off the floor. Take at least 2 to 3 seconds for each sit-up. Do not do sit-ups with your knees out straight. If partial sit-ups are difficult, simply do the above but with only tightening your abdominal muscles and holding it as directed.  Hip-Lift. Lie on your back with your knees flexed 90 degrees. Push down with your feet and shoulders as you raise your hips a couple inches off the floor; hold for 10 seconds, repeat 5 to 10 times.  Back arches. Lie on your stomach, propping yourself up on bent elbows. Slowly press on your hands, causing an arch in your low back. Repeat 3 to 5 times. Any initial stiffness and discomfort should lessen with repetition over time.  Shoulder-Lifts. Lie face down with arms beside your body. Keep hips and torso pressed to floor as you slowly lift your head and shoulders off the floor. Do not overdo your exercises, especially in the beginning. Exercises may cause you some mild back discomfort which lasts for a few minutes; however, if the pain is more severe, or lasts for more than 15 minutes, do not continue exercises until you see your caregiver. Improvement with exercise therapy for back  problems is slow.  See your caregivers for assistance with developing a proper back exercise program. Document Released: 06/07/2004 Document Revised: 07/23/2011 Document Reviewed: 03/01/2011 [facility] Patient Information 2014 [place].

## 2013-09-09 NOTE — Progress Notes (Signed)
   Subjective:    Patient ID: Angelica Kelley, female    DOB: [birth year], 68 y.o.   MRN: [id number]  HPI 68 y.o. presents for lower back pain for 1 week and left knee pain for 1 week. Left knee pain worse in the AM, worse with weight bearing, once it felt like it gave way on her, denies popping, clicking, locking. Lower back pain has had some radiation to her left thigh, denies bowel/bladder problems, numbness, tingling, weakness. She was having some suprapubic pain with bowel movements, denies blood in stool or dark black stool. Mobic not helping.    Review of Systems  Constitutional: Negative.   HENT: Negative.   Respiratory: Negative.   Cardiovascular: Negative.   Gastrointestinal: Negative.   Genitourinary: Positive for urgency and frequency. Negative for dysuria, flank pain, decreased urine volume, vaginal bleeding, vaginal discharge, enuresis, genital sores, vaginal pain, menstrual problem and pelvic pain.  Musculoskeletal: Positive for arthralgias (left knee) and back pain. Negative for gait problem.  Skin: Negative.  Negative for rash.  Neurological: Negative.   Psychiatric/Behavioral: Negative.        Objective:   Physical Exam  Constitutional: She appears well-developed and well-nourished.  HENT:  Head: Normocephalic and atraumatic.  Cardiovascular: Normal rate and regular rhythm.   Pulmonary/Chest: Effort normal and breath sounds normal.  Abdominal: Soft. Bowel sounds are normal.  Musculoskeletal: She exhibits tenderness.  General Appearance:  No distress.  Patient is able to ambulate well.  Gait is antalgic. Straight leg raising negative bilaterally for radicular symptoms. Sensory exam in the legs is normal.  Knee reflexes are normal and symmetric. Ankle reflexes are normal and symmetric Strength is normal and symmetric. There isparaspinal muscle spasm.  There is not no midline tenderness.  ROM of spine with normal flexion, extension, lateral range of motion to the right  and left, and rotation to the right and left.   Left knee with no joint line tenderness, + crepitus, no effusion, no laxity.   Skin: Skin is warm and dry.      Assessment & Plan:  Lower back pain, no radiation, neg straight leg- will check urine since having frequency- try tramadol 50, with diclofenac 75mg  BID.  Left knee pain- likely OA- exercises given  If not better we will gets xrays and refer to ortho If it gets worse, trouble with urination, AB pain, she will go to the ER

## 2013-09-10 LAB — URINALYSIS, ROUTINE W REFLEX MICROSCOPIC
Bilirubin Urine: NEGATIVE
Glucose, UA: NEGATIVE mg/dL
Hgb urine dipstick: NEGATIVE
Leukocytes, UA: NEGATIVE
Nitrite: NEGATIVE
Protein, ur: NEGATIVE mg/dL
SPECIFIC GRAVITY, URINE: 1.025 (ref 1.005–1.030)
UROBILINOGEN UA: 1 mg/dL (ref 0.0–1.0)
pH: 5.5 (ref 5.0–8.0)

## 2013-09-11 LAB — URINE CULTURE
COLONY COUNT: NO GROWTH
ORGANISM ID, BACTERIA: NO GROWTH

## 2013-09-16 ENCOUNTER — Encounter: Payer: Self-pay | Admitting: Physician Assistant

## 2013-09-16 ENCOUNTER — Other Ambulatory Visit: Payer: Self-pay | Admitting: Physician Assistant

## 2013-09-16 ENCOUNTER — Ambulatory Visit (INDEPENDENT_AMBULATORY_CARE_PROVIDER_SITE_OTHER): Payer: Medicare Other | Admitting: Physician Assistant

## 2013-09-16 ENCOUNTER — Ambulatory Visit (HOSPITAL_COMMUNITY)
Admission: RE | Admit: 2013-09-16 | Discharge: 2013-09-16 | Disposition: A | Discharge status: Home / Self Care | Payer: Medicare Other | Source: Ambulatory Visit | Attending: Physician Assistant | Admitting: Physician Assistant

## 2013-09-16 VITALS — BP 102/60 | HR 60 | Temp 98.9°F | Resp 16 | Wt 159.0 lb

## 2013-09-16 DIAGNOSIS — M47817 Spondylosis without myelopathy or radiculopathy, lumbosacral region: Secondary | ICD-10-CM | POA: Insufficient documentation

## 2013-09-16 DIAGNOSIS — M51379 Other intervertebral disc degeneration, lumbosacral region without mention of lumbar back pain or lower extremity pain: Secondary | ICD-10-CM | POA: Insufficient documentation

## 2013-09-16 DIAGNOSIS — Q762 Congenital spondylolisthesis: Secondary | ICD-10-CM | POA: Insufficient documentation

## 2013-09-16 DIAGNOSIS — M545 Low back pain, unspecified: Secondary | ICD-10-CM

## 2013-09-16 DIAGNOSIS — M5137 Other intervertebral disc degeneration, lumbosacral region: Secondary | ICD-10-CM | POA: Insufficient documentation

## 2013-09-16 DIAGNOSIS — R880 Cloudy (hemodialysis) (peritoneal) dialysis effluent: Secondary | ICD-10-CM | POA: Insufficient documentation

## 2013-09-16 DIAGNOSIS — E2839 Other primary ovarian failure: Secondary | ICD-10-CM

## 2013-09-16 MED ORDER — GABAPENTIN 300 MG PO CAPS: ORAL_CAPSULE | ORAL | Status: DC

## 2013-09-16 NOTE — Progress Notes (Signed)
   Subjective:    Patient ID: Angelica Kelley, female    DOB: [birth year], 68 y.o.   MRN: [id number]  Leg Pain   68 y.o. female with recent visit on 09/09/2013 for lower back pain that has gotten progressively worse. She states that she is now having left leg pain, worse with twisting, better after a hot shower that starts at lateral hip and radiates all the way down to her foot, she is also now having left SI pain. Denies bowel or bladder problems. She has been taking the tramadol and diclofenac without help.    Review of Systems  Constitutional: Negative.   HENT: Negative.   Respiratory: Negative.   Cardiovascular: Negative.   Gastrointestinal: Negative.   Genitourinary: Negative for dysuria, urgency, frequency, flank pain, decreased urine volume, vaginal bleeding, vaginal discharge, enuresis, genital sores, vaginal pain, menstrual problem and pelvic pain.  Musculoskeletal: Positive for arthralgias (left knee) and back pain. Negative for gait problem.  Skin: Negative.  Negative for rash.  Neurological: Negative.   Psychiatric/Behavioral: Negative.        Objective:   Physical Exam  Constitutional: She appears well-developed and well-nourished.  HENT:  Head: Normocephalic and atraumatic.  Cardiovascular: Normal rate and regular rhythm.   Pulmonary/Chest: Effort normal and breath sounds normal.  Abdominal: Soft. Bowel sounds are normal.  Musculoskeletal: She exhibits tenderness.  General Appearance:  No distress.  Patient is able to ambulate well.  Gait is antalgic. Straight leg raising negative bilaterally for radicular symptoms. Sensory exam in the legs is normal.  Knee reflexes are normal and symmetric. Ankle reflexes are normal and symmetric Strength is normal and symmetric. There isparaspinal muscle spasm.  There is not no midline tenderness.  ROM of spine with normal flexion, extension, lateral range of motion to the right and left, and rotation to the right and left. Left hip  has full ROM, non tender.  Skin: Skin is warm and dry.       Assessment & Plan:  Left SI pain with some radiation, no bowel or bladder problems- Get Xray lumbar, gabapentin 300 1-2 at night, refer to ortho, RICE and stretches

## 2013-09-16 NOTE — Patient Instructions (Signed)
Back Pain, Adult Low back pain is very common. About 1 in 5 people have back pain.The cause of low back pain is rarely dangerous. The pain often gets better over time.About half of people with a sudden onset of back pain feel better in just 2 weeks. About 8 in 10 people feel better by 6 weeks.  CAUSES Some common causes of back pain include:  Strain of the muscles or ligaments supporting the spine.  Wear and tear (degeneration) of the spinal discs.  Arthritis.  Direct injury to the back. DIAGNOSIS Most of the time, the direct cause of low back pain is not known.However, back pain can be treated effectively even when the exact cause of the pain is unknown.Answering your caregiver's questions about your overall health and symptoms is one of the most accurate ways to make sure the cause of your pain is not dangerous. If your caregiver needs more information, he or she may order lab work or imaging tests (X-rays or MRIs).However, even if imaging tests show changes in your back, this usually does not require surgery. HOME CARE INSTRUCTIONS For many people, back pain returns.Since low back pain is rarely dangerous, it is often a condition that people can learn to manageon their own.   Remain active. It is stressful on the back to sit or stand in one place. Do not sit, drive, or stand in one place for more than 30 minutes at a time. Take short walks on level surfaces as soon as pain allows.Try to increase the length of time you walk each day.  Do not stay in bed.Resting more than 1 or 2 days can delay your recovery.  Do not avoid exercise or work.Your body is made to move.It is not dangerous to be active, even though your back may hurt.Your back will likely heal faster if you return to being active before your pain is gone.  Pay attention to your body when you bend and lift. Many people have less discomfortwhen lifting if they bend their knees, keep the load close to their bodies,and  avoid twisting. Often, the most comfortable positions are those that put less stress on your recovering back.  Find a comfortable position to sleep. Use a firm mattress and lie on your side with your knees slightly bent. If you lie on your back, put a pillow under your knees.  Only take over-the-counter or prescription medicines as directed by your caregiver. Over-the-counter medicines to reduce pain and inflammation are often the most helpful.Your caregiver may prescribe muscle relaxant drugs.These medicines help dull your pain so you can more quickly return to your normal activities and healthy exercise.  Put ice on the injured area.  Put ice in a plastic bag.  Place a towel between your skin and the bag.  Leave the ice on for 15-20 minutes, 03-04 times a day for the first 2 to 3 days. After that, ice and heat may be alternated to reduce pain and spasms.  Ask your caregiver about trying back exercises and gentle massage. This may be of some benefit.  Avoid feeling anxious or stressed.Stress increases muscle tension and can worsen back pain.It is important to recognize when you are anxious or stressed and learn ways to manage it.Exercise is a great option. SEEK MEDICAL CARE IF:  You have pain that is not relieved with rest or medicine.  You have pain that does not improve in 1 week.  You have new symptoms.  You are generally not feeling well. SEEK   IMMEDIATE MEDICAL CARE IF:   You have pain that radiates from your back into your legs.  You develop new bowel or bladder control problems.  You have unusual weakness or numbness in your arms or legs.  You develop nausea or vomiting.  You develop abdominal pain.  You feel faint. Document Released: 04/30/2005 Document Revised: 10/30/2011 Document Reviewed: 09/18/2010 [facility] Patient Information 2014 [place], [place]. Back Exercises Back exercises help treat and prevent back injuries. The goal of back exercises is to increase  the strength of your abdominal and back muscles and the flexibility of your back. These exercises should be started when you no longer have back pain. Back exercises include:  Pelvic Tilt. Lie on your back with your knees bent. Tilt your pelvis until the lower part of your back is against the floor. Hold this position 5 to 10 sec and repeat 5 to 10 times.  Knee to Chest. Pull first 1 knee up against your chest and hold for 20 to 30 seconds, repeat this with the other knee, and then both knees. This may be done with the other leg straight or bent, whichever feels better.  Sit-Ups or Curl-Ups. Bend your knees 90 degrees. Start with tilting your pelvis, and do a partial, slow sit-up, lifting your trunk only 30 to 45 degrees off the floor. Take at least 2 to 3 seconds for each sit-up. Do not do sit-ups with your knees out straight. If partial sit-ups are difficult, simply do the above but with only tightening your abdominal muscles and holding it as directed.  Hip-Lift. Lie on your back with your knees flexed 90 degrees. Push down with your feet and shoulders as you raise your hips a couple inches off the floor; hold for 10 seconds, repeat 5 to 10 times.  Back arches. Lie on your stomach, propping yourself up on bent elbows. Slowly press on your hands, causing an arch in your low back. Repeat 3 to 5 times. Any initial stiffness and discomfort should lessen with repetition over time.  Shoulder-Lifts. Lie face down with arms beside your body. Keep hips and torso pressed to floor as you slowly lift your head and shoulders off the floor. Do not overdo your exercises, especially in the beginning. Exercises may cause you some mild back discomfort which lasts for a few minutes; however, if the pain is more severe, or lasts for more than 15 minutes, do not continue exercises until you see your caregiver. Improvement with exercise therapy for back problems is slow.  See your caregivers for assistance with  developing a proper back exercise program. Document Released: 06/07/2004 Document Revised: 07/23/2011 Document Reviewed: 03/01/2011 [facility] Patient Information 2014 [place].

## 2013-09-23 ENCOUNTER — Inpatient Hospital Stay (HOSPITAL_COMMUNITY)
Admission: EM | Admit: 2013-09-23 | Discharge: 2013-09-30 | DRG: 071 | Disposition: A | Discharge status: Home Health | Payer: Medicare Other | Attending: Internal Medicine | Admitting: Internal Medicine

## 2013-09-23 ENCOUNTER — Emergency Department (HOSPITAL_COMMUNITY): Payer: Medicare Other

## 2013-09-23 ENCOUNTER — Other Ambulatory Visit: Payer: Self-pay | Admitting: Orthopedic Surgery

## 2013-09-23 ENCOUNTER — Encounter (HOSPITAL_COMMUNITY): Payer: Self-pay | Admitting: Emergency Medicine

## 2013-09-23 DIAGNOSIS — Z7982 Long term (current) use of aspirin: Secondary | ICD-10-CM

## 2013-09-23 DIAGNOSIS — D500 Iron deficiency anemia secondary to blood loss (chronic): Secondary | ICD-10-CM | POA: Diagnosis present

## 2013-09-23 DIAGNOSIS — R627 Adult failure to thrive: Secondary | ICD-10-CM | POA: Diagnosis present

## 2013-09-23 DIAGNOSIS — K219 Gastro-esophageal reflux disease without esophagitis: Secondary | ICD-10-CM | POA: Diagnosis present

## 2013-09-23 DIAGNOSIS — F19950 Other psychoactive substance use, unspecified with psychoactive substance-induced psychotic disorder with delusions: Secondary | ICD-10-CM

## 2013-09-23 DIAGNOSIS — E876 Hypokalemia: Secondary | ICD-10-CM | POA: Diagnosis present

## 2013-09-23 DIAGNOSIS — Y9301 Activity, walking, marching and hiking: Secondary | ICD-10-CM

## 2013-09-23 DIAGNOSIS — IMO0002 Reserved for concepts with insufficient information to code with codable children: Secondary | ICD-10-CM | POA: Diagnosis present

## 2013-09-23 DIAGNOSIS — Z9181 History of falling: Secondary | ICD-10-CM

## 2013-09-23 DIAGNOSIS — T07XXXA Unspecified multiple injuries, initial encounter: Secondary | ICD-10-CM | POA: Diagnosis present

## 2013-09-23 DIAGNOSIS — I10 Essential (primary) hypertension: Secondary | ICD-10-CM | POA: Diagnosis present

## 2013-09-23 DIAGNOSIS — E785 Hyperlipidemia, unspecified: Secondary | ICD-10-CM | POA: Diagnosis present

## 2013-09-23 DIAGNOSIS — F411 Generalized anxiety disorder: Secondary | ICD-10-CM | POA: Diagnosis present

## 2013-09-23 DIAGNOSIS — M545 Low back pain, unspecified: Secondary | ICD-10-CM

## 2013-09-23 DIAGNOSIS — R9431 Abnormal electrocardiogram [ECG] [EKG]: Secondary | ICD-10-CM

## 2013-09-23 DIAGNOSIS — F419 Anxiety disorder, unspecified: Secondary | ICD-10-CM

## 2013-09-23 DIAGNOSIS — F3289 Other specified depressive episodes: Secondary | ICD-10-CM | POA: Diagnosis present

## 2013-09-23 DIAGNOSIS — M542 Cervicalgia: Secondary | ICD-10-CM

## 2013-09-23 DIAGNOSIS — K589 Irritable bowel syndrome without diarrhea: Secondary | ICD-10-CM | POA: Diagnosis present

## 2013-09-23 DIAGNOSIS — M6282 Rhabdomyolysis: Secondary | ICD-10-CM | POA: Diagnosis present

## 2013-09-23 DIAGNOSIS — F1020 Alcohol dependence, uncomplicated: Secondary | ICD-10-CM | POA: Diagnosis present

## 2013-09-23 DIAGNOSIS — Z6825 Body mass index (BMI) 25.0-25.9, adult: Secondary | ICD-10-CM

## 2013-09-23 DIAGNOSIS — R4182 Altered mental status, unspecified: Secondary | ICD-10-CM

## 2013-09-23 DIAGNOSIS — D62 Acute posthemorrhagic anemia: Secondary | ICD-10-CM | POA: Diagnosis present

## 2013-09-23 DIAGNOSIS — F32A Depression, unspecified: Secondary | ICD-10-CM

## 2013-09-23 DIAGNOSIS — G9340 Encephalopathy, unspecified: Principal | ICD-10-CM | POA: Diagnosis present

## 2013-09-23 DIAGNOSIS — F329 Major depressive disorder, single episode, unspecified: Secondary | ICD-10-CM

## 2013-09-23 DIAGNOSIS — N179 Acute kidney failure, unspecified: Secondary | ICD-10-CM | POA: Diagnosis present

## 2013-09-23 DIAGNOSIS — T380X5A Adverse effect of glucocorticoids and synthetic analogues, initial encounter: Secondary | ICD-10-CM | POA: Diagnosis present

## 2013-09-23 DIAGNOSIS — Y9289 Other specified places as the place of occurrence of the external cause: Secondary | ICD-10-CM

## 2013-09-23 DIAGNOSIS — E870 Hyperosmolality and hypernatremia: Secondary | ICD-10-CM | POA: Diagnosis present

## 2013-09-23 DIAGNOSIS — W010XXA Fall on same level from slipping, tripping and stumbling without subsequent striking against object, initial encounter: Secondary | ICD-10-CM | POA: Diagnosis present

## 2013-09-23 DIAGNOSIS — N390 Urinary tract infection, site not specified: Secondary | ICD-10-CM | POA: Diagnosis present

## 2013-09-23 DIAGNOSIS — R5381 Other malaise: Secondary | ICD-10-CM | POA: Diagnosis present

## 2013-09-23 LAB — COMPREHENSIVE METABOLIC PANEL
ALT: 27 U/L (ref 0–35)
AST: 44 U/L — AB (ref 0–37)
Albumin: 3.9 g/dL (ref 3.5–5.2)
Alkaline Phosphatase: 80 U/L (ref 39–117)
BUN: 51 mg/dL — ABNORMAL HIGH (ref 6–23)
CO2: 25 meq/L (ref 19–32)
Calcium: 9.6 mg/dL (ref 8.4–10.5)
Chloride: 102 mEq/L (ref 96–112)
Creatinine, Ser: 1.53 mg/dL — ABNORMAL HIGH (ref 0.50–1.10)
GFR calc Af Amer: 39 mL/min — ABNORMAL LOW (ref >90)
GFR, EST NON AFRICAN AMERICAN: 33 mL/min — AB (ref >90)
Glucose, Bld: 125 mg/dL — ABNORMAL HIGH (ref 70–99)
POTASSIUM: 4.2 meq/L (ref 3.7–5.3)
SODIUM: 144 meq/L (ref 137–147)
Total Bilirubin: 0.8 mg/dL (ref 0.3–1.2)
Total Protein: 6.6 g/dL (ref 6.0–8.3)

## 2013-09-23 LAB — RAPID URINE DRUG SCREEN, HOSP PERFORMED
AMPHETAMINES: NOT DETECTED
Barbiturates: NOT DETECTED
Benzodiazepines: POSITIVE — AB
Cocaine: NOT DETECTED
OPIATES: NOT DETECTED
Tetrahydrocannabinol: NOT DETECTED

## 2013-09-23 LAB — CBC WITH DIFFERENTIAL/PLATELET
Basophils Absolute: 0 10*3/uL (ref 0.0–0.1)
Basophils Relative: 0 % (ref 0–1)
Eosinophils Absolute: 0 10*3/uL (ref 0.0–0.7)
Eosinophils Relative: 0 % (ref 0–5)
HCT: 32.4 % — ABNORMAL LOW (ref 36.0–46.0)
Hemoglobin: 10.9 g/dL — ABNORMAL LOW (ref 12.0–15.0)
LYMPHS PCT: 5 % — AB (ref 12–46)
Lymphs Abs: 0.6 10*3/uL — ABNORMAL LOW (ref 0.7–4.0)
MCH: 29.8 pg (ref 26.0–34.0)
MCHC: 33.6 g/dL (ref 30.0–36.0)
MCV: 88.5 fL (ref 78.0–100.0)
Monocytes Absolute: 0.8 10*3/uL (ref 0.1–1.0)
Monocytes Relative: 6 % (ref 3–12)
NEUTROS PCT: 89 % — AB (ref 43–77)
Neutro Abs: 10.8 10*3/uL — ABNORMAL HIGH (ref 1.7–7.7)
Platelets: 201 10*3/uL (ref 150–400)
RBC: 3.66 MIL/uL — AB (ref 3.87–5.11)
RDW: 13 % (ref 11.5–15.5)
WBC: 12.1 10*3/uL — ABNORMAL HIGH (ref 4.0–10.5)

## 2013-09-23 LAB — URINALYSIS, ROUTINE W REFLEX MICROSCOPIC
Bilirubin Urine: NEGATIVE
GLUCOSE, UA: NEGATIVE mg/dL
Hgb urine dipstick: NEGATIVE
Ketones, ur: 15 mg/dL — AB
Nitrite: NEGATIVE
PROTEIN: NEGATIVE mg/dL
SPECIFIC GRAVITY, URINE: 1.015 (ref 1.005–1.030)
Urobilinogen, UA: 1 mg/dL (ref 0.0–1.0)
pH: 5.5 (ref 5.0–8.0)

## 2013-09-23 LAB — URINE MICROSCOPIC-ADD ON

## 2013-09-23 LAB — CBC
HCT: 29.9 % — ABNORMAL LOW (ref 36.0–46.0)
Hemoglobin: 10.2 g/dL — ABNORMAL LOW (ref 12.0–15.0)
MCH: 30.2 pg (ref 26.0–34.0)
MCHC: 34.1 g/dL (ref 30.0–36.0)
MCV: 88.5 fL (ref 78.0–100.0)
PLATELETS: 187 10*3/uL (ref 150–400)
RBC: 3.38 MIL/uL — ABNORMAL LOW (ref 3.87–5.11)
RDW: 13.1 % (ref 11.5–15.5)
WBC: 10.3 10*3/uL (ref 4.0–10.5)

## 2013-09-23 LAB — CK TOTAL AND CKMB (NOT AT ARMC)
CK TOTAL: 1216 U/L — AB (ref 7–177)
CK, MB: 16.6 ng/mL (ref 0.3–4.0)
RELATIVE INDEX: 1.4 (ref 0.0–2.5)

## 2013-09-23 LAB — TSH: TSH: 0.661 u[IU]/mL (ref 0.350–4.500)

## 2013-09-23 LAB — I-STAT CG4 LACTIC ACID, ED: Lactic Acid, Venous: 0.67 mmol/L (ref 0.5–2.2)

## 2013-09-23 LAB — CREATININE, SERUM
CREATININE: 1.17 mg/dL — AB (ref 0.50–1.10)
GFR calc Af Amer: 53 mL/min — ABNORMAL LOW (ref >90)
GFR calc non Af Amer: 46 mL/min — ABNORMAL LOW (ref >90)

## 2013-09-23 LAB — AMMONIA: AMMONIA: 16 umol/L (ref 11–60)

## 2013-09-23 LAB — ETHANOL: Alcohol, Ethyl (B): <11 mg/dL (ref 0–11)

## 2013-09-23 MED ORDER — PANTOPRAZOLE SODIUM 40 MG PO TBEC
80.0000 mg | DELAYED_RELEASE_TABLET | Freq: Every day | ORAL | Status: DC
  Administered 2013-09-24 – 2013-09-30 (×6): 80 mg via ORAL
  Filled 2013-09-23 (×8): qty 2

## 2013-09-23 MED ORDER — SODIUM CHLORIDE 0.9 % IV SOLN
INTRAVENOUS | Status: DC
  Administered 2013-09-23 – 2013-09-26 (×4): via INTRAVENOUS

## 2013-09-23 MED ORDER — WHITE PETROLATUM GEL
Status: AC
  Administered 2013-09-23: 0.2
  Filled 2013-09-23: qty 5

## 2013-09-23 MED ORDER — SODIUM CHLORIDE 0.9 % IV BOLUS (SEPSIS)
500.0000 mL | Freq: Once | INTRAVENOUS | Status: AC
  Administered 2013-09-23: 500 mL via INTRAVENOUS

## 2013-09-23 MED ORDER — ASPIRIN EC 81 MG PO TBEC
81.0000 mg | DELAYED_RELEASE_TABLET | Freq: Every day | ORAL | Status: DC
  Administered 2013-09-24 – 2013-09-30 (×6): 81 mg via ORAL
  Filled 2013-09-23 (×7): qty 1

## 2013-09-23 MED ORDER — SODIUM CHLORIDE 0.9 % IV BOLUS (SEPSIS)
1000.0000 mL | Freq: Once | INTRAVENOUS | Status: AC
  Administered 2013-09-23: 1000 mL via INTRAVENOUS

## 2013-09-23 MED ORDER — HEPARIN SODIUM (PORCINE) 5000 UNIT/ML IJ SOLN: 5000.0000 [IU] | Freq: Three times a day (TID) | INTRAMUSCULAR | Status: DC

## 2013-09-23 MED ORDER — BUPROPION HCL ER (XL) 300 MG PO TB24
450.0000 mg | ORAL_TABLET | Freq: Every day | ORAL | Status: DC
  Administered 2013-09-24 – 2013-09-30 (×6): 450 mg via ORAL
  Filled 2013-09-23 (×7): qty 1

## 2013-09-23 MED ORDER — SERTRALINE HCL 100 MG PO TABS
200.0000 mg | ORAL_TABLET | Freq: Every day | ORAL | Status: DC
  Administered 2013-09-23 – 2013-09-30 (×7): 200 mg via ORAL
  Filled 2013-09-23 (×3): qty 2
  Filled 2013-09-23: qty 4
  Filled 2013-09-23 (×4): qty 2

## 2013-09-23 MED ORDER — LAMOTRIGINE 25 MG PO TABS
25.0000 mg | ORAL_TABLET | Freq: Every day | ORAL | Status: DC
  Administered 2013-09-24 – 2013-09-30 (×6): 25 mg via ORAL
  Filled 2013-09-23 (×7): qty 1

## 2013-09-23 MED ORDER — TRAMADOL HCL 50 MG PO TABS
50.0000 mg | ORAL_TABLET | Freq: Three times a day (TID) | ORAL | Status: DC | PRN
  Administered 2013-09-23 – 2013-09-24 (×2): 50 mg via ORAL
  Filled 2013-09-23 (×2): qty 1

## 2013-09-23 MED ORDER — ATORVASTATIN CALCIUM 80 MG PO TABS
80.0000 mg | ORAL_TABLET | Freq: Every day | ORAL | Status: DC
  Filled 2013-09-23 (×3): qty 1

## 2013-09-23 NOTE — ED Notes (Signed)
Urine was obtained no in/out cath needed.

## 2013-09-23 NOTE — ED Notes (Signed)
Family at bedside, niece states patient drove to her house today (from [place] all the way to the [place] of [employer]), when the niece looked outside, her car was there but nobody was in it.  Police was called and they heard patient crawling around in the woods.  Patient states she was trying to find driveway.

## 2013-09-23 NOTE — ED Provider Notes (Signed)
CSN: [id number]     Arrival date & time 09/23/13  1605 History   First MD Initiated Contact with Patient 09/23/13 1616     Chief Complaint  Patient presents with  . Altered Mental Status     (Consider location/radiation/quality/duration/timing/severity/associated sxs/prior Treatment) Patient is a 68 y.o. female presenting with altered mental status. The history is provided by a relative and the patient.  Altered Mental Status Presenting symptoms: behavior changes, confusion and disorientation   Severity:  Moderate Most recent episode:  Today Episode history:  Continuous Duration:  1 day Timing:  Constant Progression:  Partially resolved Chronicity:  New Context: recent change in medication   Associated symptoms: no abdominal pain, no agitation, no bladder incontinence, no fever, no slurred speech and no vomiting     Past Medical History  Diagnosis Date  . Hypertension   . Hyperlipidemia   . GERD (gastroesophageal reflux disease)   . Anxiety   . Depression   . Alcoholism   . IBS (irritable bowel syndrome)   . Elevated hemoglobin A1c    Past Surgical History  Procedure Laterality Date  . Neck surgery  Remote     Ruptured disc, per patient. Got infected.   . Abdominal hysterectomy  1991  . Breast surgery Left 1989    [facility]  . Tonsillectomy  1965  . Cervical laminectomy  1977   Family History  Problem Relation Age of Onset  . Heart disease Mother   . Diabetes Mother   . Leukemia Father   . Heart disease Brother   . Cancer Brother   . Parkinson's disease Brother    History  Substance Use Topics  . Smoking status: Never Smoker   . Smokeless tobacco: Not on file  . Alcohol Use: No   OB History   Grav Para Term Preterm Abortions TAB SAB Ect Mult Living                 Review of Systems  Constitutional: Negative for fever.  Gastrointestinal: Negative for vomiting and abdominal pain.  Genitourinary: Negative for bladder incontinence.   Psychiatric/Behavioral: Positive for confusion. Negative for agitation.  All other systems reviewed and are negative.     Allergies  Lipitor  Home Medications   Prior to Admission medications   Medication Sig Start Date End Date Taking? Authorizing Provider  ALPRAZolam Duanne Moron) 1 MG tablet Take 1 mg by mouth 3 (three) times daily as needed. For anxiety.   Yes Historical Provider, MD  methylPREDNIsolone (MEDROL DOSPACK) 4 MG tablet Day 1 2 tab am, 1 lunch and supper, 2 at bedtime; day 2 1 at breakfast, lunch, and supper; 2 at bedtime' day 3 1 four times a day; day 4 1 tablet 3 times a day; day 5 1 tablet bid; day 6 1 tablet at breakfast; completed 2 days 09/21/13  Yes Historical Provider, MD  bisoprolol-hydrochlorothiazide (ZIAC) 5-6.25 MG per tablet Take 1 tablet by mouth daily.    Historical Provider, MD  buPROPion (WELLBUTRIN XL) 150 MG 24 hr tablet Take 450 mg by mouth daily.    Historical Provider, MD  Cholecalciferol (VITAMIN D3) 5000 UNITS CAPS Take by mouth daily.    Historical Provider, MD  diclofenac (VOLTAREN) 75 MG EC tablet Can take 2 times daily with food as needed for pain 09/09/13   Vicie Mutters, PA-C  gabapentin (NEURONTIN) 300 MG capsule 1-2 at night for pain 09/16/13   Vicie Mutters, PA-C  hydrochlorothiazide (MICROZIDE) 12.5 MG capsule Take 12.5 mg by  mouth daily.    Historical Provider, MD  Iron-DSS-B12-FA-C-E-Cu-Biotin (HEMAX) 150-1 MG TABS Take 150 mg by mouth daily.    Historical Provider, MD  lamoTRIgine (LAMICTAL) 25 MG tablet Take 25 mg by mouth daily.    Historical Provider, MD  meloxicam (MOBIC) 15 MG tablet  08/30/13   Historical Provider, MD  NEXIUM 40 MG capsule take 1 capsule by mouth once daily 04/25/13   Melissa R Smith, PA-C  rosuvastatin (CRESTOR) 40 MG tablet Take 40 mg by mouth daily.    Historical Provider, MD  sertraline (ZOLOFT) 100 MG tablet Take 2 tablets (200 mg total) by mouth daily. 04/01/13 04/01/14  Unk Pinto, MD  traMADol (ULTRAM) 50 MG  tablet Take 1 tablet (50 mg total) by mouth 3 (three) times daily as needed for moderate pain. 09/09/13   Vicie Mutters, PA-C   BP 127/57  Pulse 73  Temp(Src) 98.3 F (36.8 C) (Oral)  Resp 16  Ht 5\' 6"  (1.676 m)  Wt 159 lb (72.122 kg)  BMI 25.68 kg/m2  SpO2 100% Physical Exam  Constitutional: She is oriented to person, place, and time. She appears well-developed and well-nourished. No distress.  HENT:  Head: Normocephalic.  Eyes: Conjunctivae are normal.  Neck: Neck supple. No tracheal deviation present.  Cardiovascular: Normal rate and regular rhythm.   Pulmonary/Chest: Effort normal. No respiratory distress.  Abdominal: Soft. She exhibits no distension.  Neurological: She is alert and oriented to person, place, and time.  Skin: Skin is warm and dry.  Multiple contusions and abrasions of bilateral upper and lower extremities that are hemostatic and in different stages of healing. Skin overlying bilateral patella is erythematous and warm.  Psychiatric: She has a normal mood and affect. Thought content is delusional (Believes she spoke with multiple relatives earlier today who told her to go searching in the woods). She exhibits abnormal recent memory.    ED Course  Procedures (including critical care time) Labs Review Labs Reviewed  CBC WITH DIFFERENTIAL - Abnormal; Notable for the following:    WBC 12.1 (*)    RBC 3.66 (*)    Hemoglobin 10.9 (*)    HCT 32.4 (*)    Neutrophils Relative % 89 (*)    Neutro Abs 10.8 (*)    Lymphocytes Relative 5 (*)    Lymphs Abs 0.6 (*)    All other components within normal limits  URINALYSIS, ROUTINE W REFLEX MICROSCOPIC - Abnormal; Notable for the following:    Ketones, ur 15 (*)    Leukocytes, UA TRACE (*)    All other components within normal limits  COMPREHENSIVE METABOLIC PANEL - Abnormal; Notable for the following:    Glucose, Bld 125 (*)    BUN 51 (*)    Creatinine, Ser 1.53 (*)    AST 44 (*)    GFR calc non Af Amer 33 (*)     GFR calc Af Amer 39 (*)    All other components within normal limits  URINE MICROSCOPIC-ADD ON - Abnormal; Notable for the following:    Casts HYALINE CASTS (*)    All other components within normal limits  URINE RAPID DRUG SCREEN (HOSP PERFORMED) - Abnormal; Notable for the following:    Benzodiazepines POSITIVE (*)    All other components within normal limits  ETHANOL  AMMONIA  I-STAT CG4 LACTIC ACID, ED    Imaging Review Dg Chest 2 View  09/23/2013   CLINICAL DATA:  Altered mental status  EXAM: CHEST  2 VIEW  COMPARISON:  09/04/2011  FINDINGS: Cardiomediastinal silhouette is stable. No acute infiltrate or pleural effusion. No pulmonary edema. Elevation of the right hemidiaphragm again noted. Thoracic spine osteopenia.  IMPRESSION: No active cardiopulmonary disease.   Electronically Signed   By: Lahoma Crocker M.D.   On: 09/23/2013 18:41   Ct Head Wo Contrast  09/23/2013   CLINICAL DATA:  Altered mental status  EXAM: CT HEAD WITHOUT CONTRAST  TECHNIQUE: Contiguous axial images were obtained from the base of the skull through the vertex without intravenous contrast.  COMPARISON:  09/04/2011  FINDINGS: The bony calvarium is intact. A small left posterior parietal scalp hematoma is noted. Similar changes are noted on the right. . Mild atrophic changes are seen. No findings to suggest acute hemorrhage, acute infarction or space-occupying mass lesion are noted.  IMPRESSION: Scalp hematomas bilaterally in the posterior parietal regions. No acute intracranial abnormality is noted.   Electronically Signed   By: Inez Catalina M.D.   On: 09/23/2013 20:59     EKG Interpretation None      MDM   Final diagnoses:  Altered mental status  AKI (acute kidney injury)  Multiple contusions  Steroid-induced psychosis, with delusions    68 y.o. female presents after being found wandering in the woods on her hands and knees by family today. Per her family who found her, she claims she received a phone call  today from relatives in [place] asking her to go to her mother's trailer. The trailer in question has not been present for at least 20 years according to family. There is no evidence that the family actually called her today. She has multiple bruises all over her body that are in different stages of healing. She lives by herself. She recently was placed on a prescription for steroids to treat low back pain. Not anticoagulated. No evidence of infection on chest x-ray or urine. Patient is afebrile but does have erythema over her anterior knees bilaterally that could be from crawling in the woods. [place] evidence of mild AKI and uremia. Ammonia, ethanol, and UDS are negative. CT scan by request of the admitting team was performed and showed no acute intracranial abnormality. Patient's mental status is clearing, but due to her deterioration of symptoms over the last few days we will get her for observation and hold her steroids.  Patient admitted to medicine service for further workup and management of apparent steroid-induced delusional episode and dehydration with mild acute kidney injury and uremia.  Leo Grosser, MD 09/23/13 2138

## 2013-09-23 NOTE — Progress Notes (Signed)
CRITICAL VALUE ALERT  Critical value received:  16.6  Date of notification:09/23/13  Time of notification: 2255  Critical value read back:yes  Nurse who received alert:  Ova Freshwater RN  MD notified (1st page): Raliegh Ip Schorr  Time of first page:  2258  MD notified (2nd page):  Time of second page:  Responding MD K.Schorr  Time MD responded: 2330

## 2013-09-23 NOTE — ED Notes (Signed)
MD at bedside. 

## 2013-09-23 NOTE — ED Notes (Signed)
Per EMS: EMS called to evaluate patient because she wasn't acting right, family called.  Stated she was on her way to visit her mother (who is deceased), unsure of current medications, has bruises in various stages of healing all over her body, has shirt and pants on backwards, family was not able to provide much history, unsure if they are en route.  Patient started on new medication (prednisone, unsure of what for.).  CBG: 119  Patient states she crawled through the woods to get to her daughters house and was trying to find her driveway because she was late for a doctor's appointment.  She has leaves and twigs all over her.  Oriented to self, time, place but not situation  119 BP: 125/64 HR 70 Spo2: 97   CBG: 119

## 2013-09-23 NOTE — H&P (Addendum)
Hospitalist Admission History and Physical  Patient name: Angelica Kelley record number: [id number] Date of birth: 1945/06/10 Age: 68 y.o. Gender: female  Primary Care Provider: Alesia Richards, MD  Chief Complaint: encephalopathy, fall with multiple abrasions, AKI, dehydration  History of Present Illness:This is a 68 y.o. year old female with prior history of depression, remote history of alcohol abuse, anxiety, hypertension presented with encephalopathy. Patient states that she has been progressively confused over the past one to 2 days. Patient states that she went to her mother's old land and further old house (there is no house there). Patient states that she then fell and was walking on her hands and knees in the middle of the wounds over an extended period of time. Per report, she was found by family. Patient is felt multiple bruises and abrasions over her. Patient reports that she was recently put on a prednisone burst pack within the past week. Patient otherwise denies any significant changes recently. Denies any alcohol intake. No illicit drug use. Presented to the ER with low limits of normal blood pressures into the 90s to 110s. Otherwise vital signs stable. Her white blood cell count 12.1. Hemoglobin 10.9. Creatinine 1.53 BUN 51. Head CT showed some scalp hematomas bilaterally but no acute intracranial abnormality. Chest x-ray negative for infiltrate. UA with trace leukocytes. Ammonia, ethanol, lactate within normal limits. UDS positive for benzos. Patient is fairly coherent at the time of my exam. There is no family at the bedside. She does recall what happened earlier today but does understand why she did it. Does feel symptomatically improved at this point. Patient Active Problem List   Diagnosis Date Noted  . Encephalopathy 09/23/2013  . Hypertension   . Hyperlipidemia   . GERD (gastroesophageal reflux disease)   . Anxiety   . Depression   . Alcoholism   . IBS  (irritable bowel syndrome)   . Elevated hemoglobin A1c   . Abnormal ECG 09/05/2011   Past Medical History: Past Medical History  Diagnosis Date  . Hypertension   . Hyperlipidemia   . GERD (gastroesophageal reflux disease)   . Anxiety   . Depression   . Alcoholism   . IBS (irritable bowel syndrome)   . Elevated hemoglobin A1c     Past Surgical History: Past Surgical History  Procedure Laterality Date  . Neck surgery  Remote     Ruptured disc, per patient. Got infected.   . Abdominal hysterectomy  1991  . Breast surgery Left 1989    [facility]  . Tonsillectomy  1965  . Cervical laminectomy  1977    Social History: History   Social History  . Marital Status: Divorced    Spouse Name: N/A    Number of Children: N/A  . Years of Education: N/A   Social History Main Topics  . Smoking status: Never Smoker   . Smokeless tobacco: None  . Alcohol Use: No  . Drug Use: None  . Sexual Activity: None   Other Topics Concern  . None   Social History Narrative   Pt's niece, phone number [phone]. Pt lives alone at home, and doesn't use a cane or walker, is still pretty active. Never smoker.     Family History: Family History  Problem Relation Age of Onset  . Heart disease Mother   . Diabetes Mother   . Leukemia Father   . Heart disease Brother   . Cancer Brother   . Parkinson's disease Brother     Allergies: Allergies  Allergen Reactions  . Lipitor [Atorvastatin]     Fatigue    Current Facility-Administered Medications  Medication Dose Route Frequency Provider Last Rate Last Dose  . 0.9 %  sodium chloride infusion   Intravenous Continuous Shanda Howells, MD      . Derrill Memo ON 09/24/2013] aspirin EC tablet 81 mg  81 mg Oral Daily Shanda Howells, MD      . atorvastatin (LIPITOR) tablet 80 mg  80 mg Oral q1800 Shanda Howells, MD      . Derrill Memo ON 09/24/2013] buPROPion (WELLBUTRIN XL) 24 hr tablet 450 mg  450 mg Oral Daily Shanda Howells, MD      . heparin injection 5,000  Units  5,000 Units Subcutaneous 3 times per day Shanda Howells, MD      . Derrill Memo ON 09/24/2013] lamoTRIgine (LAMICTAL) tablet 25 mg  25 mg Oral Daily Shanda Howells, MD      . Derrill Memo ON 09/24/2013] pantoprazole (PROTONIX) EC tablet 80 mg  80 mg Oral Q1200 Shanda Howells, MD      . sertraline (ZOLOFT) tablet 200 mg  200 mg Oral Daily Shanda Howells, MD      . traMADol Veatrice Bourbon) tablet 50 mg  50 mg Oral TID PRN Shanda Howells, MD       Current Outpatient Prescriptions  Medication Sig Dispense Refill  . ALPRAZolam (XANAX) 1 MG tablet Take 1 mg by mouth 3 (three) times daily as needed. For anxiety.      Marland Kitchen aspirin EC 81 MG tablet Take 81 mg by mouth daily.      . bisoprolol-hydrochlorothiazide (ZIAC) 5-6.25 MG per tablet Take 1 tablet by mouth daily.      Marland Kitchen buPROPion (WELLBUTRIN XL) 150 MG 24 hr tablet Take 450 mg by mouth daily.      . Cholecalciferol (VITAMIN D3) 5000 UNITS CAPS Take 5,000 Units by mouth daily.       Marland Kitchen esomeprazole (NEXIUM) 40 MG capsule Take 40 mg by mouth daily at 12 noon.      . gabapentin (NEURONTIN) 300 MG capsule Take 300 mg by mouth at bedtime.      . hydrochlorothiazide (MICROZIDE) 12.5 MG capsule Take 12.5 mg by mouth daily.      . Iron-DSS-B12-FA-C-E-Cu-Biotin (HEMAX) 150-1 MG TABS Take 150 mg by mouth daily.      Marland Kitchen lamoTRIgine (LAMICTAL) 25 MG tablet Take 25 mg by mouth daily.      . meloxicam (MOBIC) 15 MG tablet Take 15 mg by mouth daily.       . methylPREDNIsolone (MEDROL DOSPACK) 4 MG tablet Take 4-8 mg by mouth See admin instructions. Day 1: 2 tab in am, 1 tab atlunch and supper, 2 tab at bedtime; day 2: 1 tab at breakfast, lunch, and supper; 2tablets at bedtime day 3: 1 tablet at breakfast, lunch, supper and bedtime; day 4: 1 tablet at breakfast lunch and supper; day 5: 1 tablet at breakfast and supper; day 6: 1 tablet at breakfast;      . rosuvastatin (CRESTOR) 40 MG tablet Take 20 mg by mouth daily.       . sertraline (ZOLOFT) 100 MG tablet Take 2 tablets (200 mg total)  by mouth daily.  30 tablet  2  . traMADol (ULTRAM) 50 MG tablet Take 1 tablet (50 mg total) by mouth 3 (three) times daily as needed for moderate pain.  90 tablet  0   Review Of Systems: 12 point ROS negative except as noted above in HPI.  Physical Exam:  Filed Vitals:   09/23/13 2030  BP: 112/54  Pulse: 77  Temp:   Resp: 21    General: alert and cooperative HEENT: PERRLA and Mild left eye conjunctival hemorrhage. Heart: S1, S2 normal, no murmur, rub or gallop, regular rate and rhythm Lungs: clear to auscultation, no wheezes or rales and unlabored breathing Abdomen: abdomen is soft without significant tenderness, masses, organomegaly or guarding Extremities: Diffuse ecchymoses and abrasions in the lower extremities as well as upper extremities. Mild tenderness to palpation over affected abrasions. Skin: As above Neurology: normal without focal findings, mental status, speech normal, alert and oriented x3, PERLA and reflexes normal and symmetric  Labs and Imaging: Lab Results  Component Value Date/Time   NA 144 09/23/2013  6:00 PM   K 4.2 09/23/2013  6:00 PM   CL 102 09/23/2013  6:00 PM   CO2 25 09/23/2013  6:00 PM   BUN 51* 09/23/2013  6:00 PM   CREATININE 1.53* 09/23/2013  6:00 PM   CREATININE 1.02 07/13/2013  3:18 PM   GLUCOSE 125* 09/23/2013  6:00 PM   Lab Results  Component Value Date   WBC 12.1* 09/23/2013   HGB 10.9* 09/23/2013   HCT 32.4* 09/23/2013   MCV 88.5 09/23/2013   PLT 201 09/23/2013   Urinalysis    Component Value Date/Time   COLORURINE YELLOW 09/23/2013 1918   APPEARANCEUR CLEAR 09/23/2013 1918   LABSPEC 1.015 09/23/2013 1918   PHURINE 5.5 09/23/2013 1918   GLUCOSEU NEGATIVE 09/23/2013 1918   HGBUR NEGATIVE 09/23/2013 1918   BILIRUBINUR NEGATIVE 09/23/2013 1918   KETONESUR 15* 09/23/2013 1918   PROTEINUR NEGATIVE 09/23/2013 1918   UROBILINOGEN 1.0 09/23/2013 1918   NITRITE NEGATIVE 09/23/2013 1918   LEUKOCYTESUR TRACE* 09/23/2013 1918       Dg Chest 2  View  09/23/2013   CLINICAL DATA:  Altered mental status  EXAM: CHEST  2 VIEW  COMPARISON:  09/04/2011  FINDINGS: Cardiomediastinal silhouette is stable. No acute infiltrate or pleural effusion. No pulmonary edema. Elevation of the right hemidiaphragm again noted. Thoracic spine osteopenia.  IMPRESSION: No active cardiopulmonary disease.   Electronically Signed   By: Lahoma Crocker M.D.   On: 09/23/2013 18:41   Ct Head Wo Contrast  09/23/2013   CLINICAL DATA:  Altered mental status  EXAM: CT HEAD WITHOUT CONTRAST  TECHNIQUE: Contiguous axial images were obtained from the base of the skull through the vertex without intravenous contrast.  COMPARISON:  09/04/2011  FINDINGS: The bony calvarium is intact. A small left posterior parietal scalp hematoma is noted. Similar changes are noted on the right. . Mild atrophic changes are seen. No findings to suggest acute hemorrhage, acute infarction or space-occupying mass lesion are noted.  IMPRESSION: Scalp hematomas bilaterally in the posterior parietal regions. No acute intracranial abnormality is noted.   Electronically Signed   By: Inez Catalina M.D.   On: 09/23/2013 20:59     Assessment and Plan: Angelica Kelley is a 68 y.o. year old female presenting with encephalopathy, fall with multiple abrasions, AKI, dehydration  Encephalopathy: Suspect symptoms are likely secondary to recent prednisone use. Ddx also includes infectious/metabolic sources albeit lower. Patient seems to be clearing at this point. We'll continue to follow clinically. Hold prednisone.  Full multiple abrasions: Noted multiple superficial ecchymoses and abrasions. We'll continue to follow. Check INR to rule out coagulopathy. Check CK. Platelet count within normal limits currently. Consider addition of DIC panel if INR is abnormal.  AKI: Likely secondary to dehydration. BUN/creatinine  ratio 20:1. Suspect the patient was highly active with very little fluid intake in the hot sun secondary to  encephalopathy today. We'll hydrate patient and reassess. Hold nephrotoxic agents.  Leukocytosis: Likely secondary to recent prednisone use. No exercise infection though UA is mildly indicative. Panculture. Consider addition of Rocephin if white blood cell count up trends.  Hypertension: Soft blood pressures in a setting of dehydration. Hold antihypertensives. Continue to follow. Titrate as indicated. Anemia: Hemoglobin 10.8 today. Was around 14 back in March. Noted multiple abrasions and ecchymoses diffusely. No signs of active extravasation currently. We'll check INR. Check anemia panel. Trend.  Psych: Mood stable. We'll continue long-acting outpatient mood medications. Hold Xanax in the interim as this may cloud encephalopathy picture.  FEN/GI: Heart healthy diet. Prophylaxis: SCDs  Disposition: Pending further evaluation Code Status: Full code       Shanda Howells MD  Pager: [phone]

## 2013-09-23 NOTE — ED Notes (Signed)
Attempted report 

## 2013-09-24 ENCOUNTER — Encounter (HOSPITAL_COMMUNITY): Payer: Self-pay

## 2013-09-24 DIAGNOSIS — R9431 Abnormal electrocardiogram [ECG] [EKG]: Secondary | ICD-10-CM

## 2013-09-24 DIAGNOSIS — N179 Acute kidney failure, unspecified: Secondary | ICD-10-CM

## 2013-09-24 LAB — HEMOGLOBIN A1C
Hgb A1c MFr Bld: 5.7 % — ABNORMAL HIGH (ref <5.7)
MEAN PLASMA GLUCOSE: 117 mg/dL — AB (ref <117)

## 2013-09-24 LAB — COMPREHENSIVE METABOLIC PANEL
ALT: 24 U/L (ref 0–35)
AST: 38 U/L — ABNORMAL HIGH (ref 0–37)
Albumin: 3.2 g/dL — ABNORMAL LOW (ref 3.5–5.2)
Alkaline Phosphatase: 66 U/L (ref 39–117)
BUN: 35 mg/dL — ABNORMAL HIGH (ref 6–23)
CALCIUM: 8.8 mg/dL (ref 8.4–10.5)
CO2: 23 mEq/L (ref 19–32)
CREATININE: 0.93 mg/dL (ref 0.50–1.10)
Chloride: 106 mEq/L (ref 96–112)
GFR calc Af Amer: 71 mL/min — ABNORMAL LOW (ref >90)
GFR calc non Af Amer: 61 mL/min — ABNORMAL LOW (ref >90)
GLUCOSE: 97 mg/dL (ref 70–99)
Potassium: 3.6 mEq/L — ABNORMAL LOW (ref 3.7–5.3)
SODIUM: 145 meq/L (ref 137–147)
TOTAL PROTEIN: 5.7 g/dL — AB (ref 6.0–8.3)
Total Bilirubin: 0.8 mg/dL (ref 0.3–1.2)

## 2013-09-24 LAB — CBC WITH DIFFERENTIAL/PLATELET
Basophils Absolute: 0 10*3/uL (ref 0.0–0.1)
Basophils Relative: 0 % (ref 0–1)
Eosinophils Absolute: 0.1 10*3/uL (ref 0.0–0.7)
Eosinophils Relative: 1 % (ref 0–5)
HEMATOCRIT: 29.3 % — AB (ref 36.0–46.0)
Hemoglobin: 9.8 g/dL — ABNORMAL LOW (ref 12.0–15.0)
LYMPHS ABS: 1 10*3/uL (ref 0.7–4.0)
LYMPHS PCT: 10 % — AB (ref 12–46)
MCH: 30 pg (ref 26.0–34.0)
MCHC: 33.4 g/dL (ref 30.0–36.0)
MCV: 89.6 fL (ref 78.0–100.0)
MONO ABS: 0.7 10*3/uL (ref 0.1–1.0)
Monocytes Relative: 7 % (ref 3–12)
Neutro Abs: 7.9 10*3/uL — ABNORMAL HIGH (ref 1.7–7.7)
Neutrophils Relative %: 82 % — ABNORMAL HIGH (ref 43–77)
Platelets: 187 10*3/uL (ref 150–400)
RBC: 3.27 MIL/uL — AB (ref 3.87–5.11)
RDW: 13.3 % (ref 11.5–15.5)
WBC: 9.7 10*3/uL (ref 4.0–10.5)

## 2013-09-24 LAB — PROTIME-INR
INR: 1.13 (ref 0.00–1.49)
PROTHROMBIN TIME: 14.3 s (ref 11.6–15.2)

## 2013-09-24 MED ORDER — ALPRAZOLAM 0.5 MG PO TABS
1.0000 mg | ORAL_TABLET | Freq: Three times a day (TID) | ORAL | Status: DC | PRN
  Administered 2013-09-24 – 2013-09-26 (×5): 1 mg via ORAL
  Filled 2013-09-24 (×5): qty 2

## 2013-09-24 MED ORDER — POTASSIUM CHLORIDE CRYS ER 20 MEQ PO TBCR
40.0000 meq | EXTENDED_RELEASE_TABLET | Freq: Once | ORAL | Status: AC
  Administered 2013-09-24: 40 meq via ORAL
  Filled 2013-09-24: qty 2

## 2013-09-24 MED ORDER — DEXTROSE 5 % IV SOLN
1.0000 g | INTRAVENOUS | Status: DC
  Administered 2013-09-24 – 2013-09-29 (×6): 1 g via INTRAVENOUS
  Filled 2013-09-24 (×10): qty 10

## 2013-09-24 MED ORDER — OXYCODONE HCL 5 MG PO TABS
5.0000 mg | ORAL_TABLET | ORAL | Status: DC | PRN
  Administered 2013-09-24 – 2013-09-30 (×7): 5 mg via ORAL
  Filled 2013-09-24 (×7): qty 1

## 2013-09-24 NOTE — Progress Notes (Signed)
UR completed. Patient changed to inpatient- requiring IV antibiotics and IVF

## 2013-09-24 NOTE — Progress Notes (Signed)
Patient ID: Angelica Kelley, female   DOB: 10/22/1945, 68 y.o.   MRN: [id number]  [facility] PROGRESS NOTE  Angelica Kelley DOB: Sep 28, 1945 DOA: 09/23/2013 PCP: Alesia Richards, MD  Brief narrative: 68 y.o. year old female with prior history of depression, remote history of alcohol abuse, anxiety, hypertension presented with altered mental status that was initially noted several days prior to this admission, associated with multiple falls at home. Please note that pt was unable to provide detailed history due to confusion.  Active Problems:   Encephalopathy, acute - appears to be resolved at this point - pt is A&O x 3, follows commands appropriately - PT/OT eval pending   Elevated CK MB, CK total - possibly related to rhabdo in the setting of falls - will continue IVF and repeat CK MB in AM   Frequent falls - secondary to progressive FTT and deconditioning - PT/OT as noted above    ? UTI - follow up on urine culture - place on Rocephin IV   Acute renal failure - IVF provided and pt has responded well - Cr is now WNL    Hypokalemia - will supplement and repeat BMP in AM   Acute on chronic blood loss anemia - no signs of active bleeding - CBC in AM   Transaminitis - possibly from rhabdo  - CMET in AM  Consultants:  None   Procedures/Studies: Dg Chest 2 View  09/23/2013  No active cardiopulmonary disease.     Ct Head Wo Contrast   09/23/2013  Scalp hematomas bilaterally in the posterior parietal regions. No acute intracranial abnormality is noted.     Antibiotics:  Rocephin 5/14 -->   Code Status: Full Family Communication: Pt at bedside Disposition Plan: To be determined   HPI/Subjective: No events overnight.   Objective: Filed Vitals:   09/23/13 2204 09/23/13 2215 09/23/13 2258 09/24/13 0519  BP: 118/53 118/67 125/67 120/55  Pulse: 71 71 70 67  Temp:   98 F (36.7 C) 98.1 F (36.7 C)  TempSrc:   Oral   Resp: 18 21 20 19   Height:    5'  6" (1.676 m)  Weight:    75.206 kg (165 lb 12.8 oz)  SpO2: 92% 98% 99% 100%    Intake/Output Summary (Last 24 hours) at 09/24/13 1130 Last data filed at 09/24/13 0910  Gross per 24 hour  Intake    120 ml  Output      0 ml  Net    120 ml    Exam:   General:  Pt is alert, follows commands appropriately, not in acute distress  Cardiovascular: Regular rate and rhythm,  no rubs, no gallops  Respiratory: Clear to auscultation bilaterally, no wheezing, no crackles, no rhonchi  Abdomen: Soft, non tender, non distended, bowel sounds present, no guarding  Extremities: No edema, pulses DP and PT palpable bilaterally, multiple bruises in lower and upper extremities   Neuro: Grossly nonfocal  Data Reviewed: Basic Metabolic Panel:  Recent Labs Lab 09/23/13 1800 09/23/13 2205 09/24/13 0444  NA 144  --  145  K 4.2  --  3.6*  CL 102  --  106  CO2 25  --  23  GLUCOSE 125*  --  97  BUN 51*  --  35*  CREATININE 1.53* 1.17* 0.93  CALCIUM 9.6  --  8.8   Liver Function Tests:  Recent Labs Lab 09/23/13 1800 09/24/13 0444  AST 44* 38*  ALT 27 24  ALKPHOS  80 66  BILITOT 0.8 0.8  PROT 6.6 5.7*  ALBUMIN 3.9 3.2*    Recent Labs Lab 09/23/13 2010  AMMONIA 16   CBC:  Recent Labs Lab 09/23/13 1800 09/23/13 2205 09/24/13 0444  WBC 12.1* 10.3 9.7  NEUTROABS 10.8*  --  7.9*  HGB 10.9* 10.2* 9.8*  HCT 32.4* 29.9* 29.3*  MCV 88.5 88.5 89.6  PLT 201 187 187   Cardiac Enzymes:  Recent Labs Lab 09/23/13 2205  CKTOTAL 1216*  CKMB 16.6*   Scheduled Meds: . aspirin EC  81 mg Oral Daily  . atorvastatin  80 mg Oral q1800  . buPROPion  450 mg Oral Daily  . lamoTRIgine  25 mg Oral Daily  . pantoprazole  80 mg Oral Q1200  . sertraline  200 mg Oral Daily   Continuous Infusions: . sodium chloride 50 mL/hr at 09/24/13 Leslie, MD  [facility] Pager [phone]  If 7PM-7AM, please contact night-coverage [email or web address] Password [email or web address] 09/24/2013, 11:30 AM   LOS:  1 day

## 2013-09-25 ENCOUNTER — Inpatient Hospital Stay (HOSPITAL_COMMUNITY): Payer: Medicare Other

## 2013-09-25 DIAGNOSIS — F1020 Alcohol dependence, uncomplicated: Secondary | ICD-10-CM

## 2013-09-25 LAB — COMPREHENSIVE METABOLIC PANEL
ALT: 29 U/L (ref 0–35)
AST: 36 U/L (ref 0–37)
Albumin: 3.3 g/dL — ABNORMAL LOW (ref 3.5–5.2)
Alkaline Phosphatase: 70 U/L (ref 39–117)
BUN: 18 mg/dL (ref 6–23)
CALCIUM: 9 mg/dL (ref 8.4–10.5)
CHLORIDE: 107 meq/L (ref 96–112)
CO2: 25 mEq/L (ref 19–32)
Creatinine, Ser: 0.68 mg/dL (ref 0.50–1.10)
GFR calc Af Amer: >90 mL/min (ref >90)
GFR, EST NON AFRICAN AMERICAN: 87 mL/min — AB (ref >90)
Glucose, Bld: 107 mg/dL — ABNORMAL HIGH (ref 70–99)
Potassium: 3.1 mEq/L — ABNORMAL LOW (ref 3.7–5.3)
SODIUM: 145 meq/L (ref 137–147)
Total Bilirubin: 0.6 mg/dL (ref 0.3–1.2)
Total Protein: 6 g/dL (ref 6.0–8.3)

## 2013-09-25 LAB — CBC WITH DIFFERENTIAL/PLATELET
BASOS PCT: 0 % (ref 0–1)
Basophils Absolute: 0 10*3/uL (ref 0.0–0.1)
EOS ABS: 0.3 10*3/uL (ref 0.0–0.7)
EOS PCT: 3 % (ref 0–5)
HCT: 32.5 % — ABNORMAL LOW (ref 36.0–46.0)
HEMOGLOBIN: 10.6 g/dL — AB (ref 12.0–15.0)
LYMPHS ABS: 1.2 10*3/uL (ref 0.7–4.0)
Lymphocytes Relative: 14 % (ref 12–46)
MCH: 29.9 pg (ref 26.0–34.0)
MCHC: 32.6 g/dL (ref 30.0–36.0)
MCV: 91.5 fL (ref 78.0–100.0)
MONO ABS: 0.6 10*3/uL (ref 0.1–1.0)
MONOS PCT: 7 % (ref 3–12)
Neutro Abs: 6.5 10*3/uL (ref 1.7–7.7)
Neutrophils Relative %: 76 % (ref 43–77)
Platelets: 219 10*3/uL (ref 150–400)
RBC: 3.55 MIL/uL — ABNORMAL LOW (ref 3.87–5.11)
RDW: 13.4 % (ref 11.5–15.5)
WBC: 8.6 10*3/uL (ref 4.0–10.5)

## 2013-09-25 LAB — CK TOTAL AND CKMB (NOT AT ARMC)
CK, MB: 12.5 ng/mL (ref 0.3–4.0)
Relative Index: 2 (ref 0.0–2.5)
Total CK: 618 U/L — ABNORMAL HIGH (ref 7–177)

## 2013-09-25 NOTE — Evaluation (Signed)
Physical Therapy Evaluation Patient Details Name: Angelica Kelley MRN: [id number] DOB: 24-Sep-1945 Today's Date: 09/25/2013   History of Present Illness     Clinical Impression  68 year old with recent balance issues - saw MD earlier in week and was put on steriods.  She got really confused and multiple falls and was brought to the hospital.  Pt with no pain at rest but intense pain with wegiht bearing in left leg more than right - MD notified and xrays ordered.  Pt remains confused which puts her at high risk of falls - she had hard time following instructions and was unsafe going to the bathroom - I had to call for assist to safely get her back to bed.  Nursing and tech aware    Follow Up Recommendations Supervision/Assistance - 24 hour;Home health PT;SNF    Equipment Recommendations  Rolling walker with 5" wheels    Recommendations for Other Services       Precautions / Restrictions Precautions Precautions: Fall Restrictions Weight Bearing Restrictions: No      Mobility  Bed Mobility Overal bed mobility: Modified Independent (pt used bed rail to sit up)             General bed mobility comments: pt able to lay back down on her own but yelled about pain in hips when she lifted her feet into the bed.  pt hurting to bad to help scoot herself up in the bed  Transfers Overall transfer level: Needs assistance Equipment used: Rolling walker (2 wheeled) Transfers: Sit to/from Stand Sit to Stand: Min assist         General transfer comment: pt very unsafe with toileting.  started getting ready (with gown and pad) before she got all the way to the toilet).  unable to follow my directions for safety.  pt washed hands after and unsafe - hard time following directios about not getting IV wet - this overwhelmed ehr and she started crying  Ambulation/Gait Ambulation/Gait assistance: Mod assist;Min assist Ambulation Distance (Feet):  (10 feet to toilet, 5 fee to sink and 5 feet back  to bed) Assistive device: Rolling walker (2 wheeled) Gait Pattern/deviations: Decreased stride length;Decreased stance time - left;Shuffle     General Gait Details: Pr walked to the toilet with min assist but unsteady with turning and sitting on toilet.  Pt then walked to sink and started complaining of leg hurting too much and not wanting to put weight on it and crying and upset.  I called for assist as I couldnt calm pt and help her to bed and manage IV.  pt stood waiting for help to arrive and wouldnt put any wieght on lefft  leg and said that it didnt hurt if no weight.  pt made it back to bed safety but has hard eimt following directions.  Stairs            Wheelchair Mobility    Modified Rankin (Stroke Patients Only)       Balance Overall balance assessment: History of Falls                                           Pertinent Vitals/Pain Pain is limiting factor    Home Living Family/patient expects to be discharged to:: Private residence Living Arrangements: Alone Available Help at Discharge: Friend(s) (neice checks on her ) Type of Home:  Mobile home Home Access: Stairs to enter Entrance Stairs-Rails: Right Entrance Stairs-Number of Steps: Pt said she has 3-4 steps but they are slanted funny and pt fell up them twice the other day Home Layout: One level Home Equipment: Crutches      Prior Function           Comments: pt lives alone and having increaed falls - pt got lost trying to find somehwere - got lost in woods and fell alot - that is why police/ambualnce called and pt brought to hospital.  pt says she lives alone and does well.  she goes out to eat with neighbor most meals - so doenet need to shop or cook.  pt does drive     Hand Dominance        Extremity/Trunk Assessment               Lower Extremity Assessment: Generalized weakness      Cervical / Trunk Assessment: Normal  Communication   Communication: Expressive  difficulties (pt got mixed up in her stories. said wrong word etc)  Cognition Arousal/Alertness: Awake/alert Behavior During Therapy: Restless;Agitated;Anxious Overall Cognitive Status: Impaired/Different from baseline Area of Impairment: Memory;Safety/judgement;Awareness;Problem solving         Safety/Judgement: Decreased awareness of safety          General Comments General comments (skin integrity, edema, etc.): Pt with big bruises on both hips, tailbone, knees and shins.  the more she walked - the more she hurt.  pt not safe with mobilty - poor safety awareness.  pt is high risk of falls.  let nurse and tech know.    Exercises        Assessment/Plan    PT Assessment Patient needs continued PT services (will check xray results before next treatment)  PT Diagnosis Difficulty walking;Generalized weakness;Acute pain;Altered mental status   PT Problem List Decreased activity tolerance;Decreased balance;Decreased mobility;Decreased cognition;Decreased knowledge of use of DME;Decreased safety awareness;Pain  PT Treatment Interventions DME instruction;Gait training;Functional mobility training;Therapeutic activities;Balance training;Patient/family education   PT Goals (Current goals can be found in the Care Plan section) Acute Rehab PT Goals Patient Stated Goal: to get back home and feel better PT Goal Formulation: With patient Time For Goal Achievement: 10/09/13 Potential to Achieve Goals: Good    Frequency Min 3X/week   Barriers to discharge Decreased caregiver support      Co-evaluation               End of Session Equipment Utilized During Treatment: Gait belt Activity Tolerance: Patient limited by pain Patient left: in bed;with call bell/phone within reach;with bed alarm set (I put her back in bed as no chair alarm  available) Nurse Communication: Mobility status (notified nursing about pain in hips and that MD ordered x ray)         Time: 1025-1100 PT  Time Calculation (min): 35 min   Charges:   PT Evaluation $Initial PT Evaluation Tier I: 1 Procedure PT Treatments $Gait Training: 23-37 mins   PT G Codes:          Loyal Buba 09/25/2013, 11:45 AM
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
## 2013-09-25 NOTE — Progress Notes (Signed)
Patient ID: Angelica Kelley, female   DOB: 19-Mar-1946, 68 y.o.   MRN: [id number]  [facility] PROGRESS NOTE  Angelica Kelley EXB:[id number] DOB: [birth year] DOA: 09/23/2013 PCP: Alesia Richards, MD  Brief narrative:  68 y.o. year old female with prior history of depression, remote history of alcohol abuse, anxiety, hypertension presented with altered mental status that was initially noted several days prior to this admission, associated with multiple falls at home. Please note that pt was unable to provide detailed history due to confusion.   Active Problems:  Encephalopathy, acute  - appears to be resolved at this point  - pt is A&O x 3, follows commands appropriately  - PT/OT eval, may need SNF but pt wants to go home  Elevated CK MB, CK total  - possibly related to rhabdo in the setting of falls  - trending down towards the target range  - will continue IVF and repeat CK MB in AM  Frequent falls  - secondary to progressive FTT and deconditioning  - bilateral hips XRAY negative for fractures  - PT/OT as noted above  ? UTI  - follow up on urine culture  - continue Rocephin IV  Acute renal failure  - IVF provided and pt has responded well  - Cr is now WNL  Hypokalemia  - will supplement and repeat BMP in AM  Acute on chronic blood loss anemia  - no signs of active bleeding  - CBC in AM  Transaminitis  - possibly from rhabdo  - now resolved  - CMET in AM   Consultants:  None  Procedures/Studies:  Dg Chest 2 View 09/23/2013 No active cardiopulmonary disease.  Ct Head Wo Contrast 09/23/2013 Scalp hematomas bilaterally in the posterior parietal regions. No acute intracranial abnormality is noted.  Antibiotics:  Rocephin 5/14 -->   Code Status: Full  Family Communication: Pt at bedside  Disposition Plan: To be determined   HPI/Subjective: No events overnight.   Objective: Filed Vitals:   09/24/13 1337 09/24/13 2250 09/25/13 0619 09/25/13 1348  BP: 108/67 113/50  124/51 107/47  Pulse: 74 64 60 70  Temp: 98.7 F (37.1 C) 98.2 F (36.8 C) 98.1 F (36.7 C) 97.4 F (36.3 C)  TempSrc: Oral Oral Oral Oral  Resp: 20 18 18 19   Height:      Weight:   74.39 kg (164 lb)   SpO2: 100% 100% 94% 98%    Intake/Output Summary (Last 24 hours) at 09/25/13 1559 Last data filed at 09/25/13 1454  Gross per 24 hour  Intake    240 ml  Output    325 ml  Net    -85 ml    Exam:   General:  Pt is alert, follows commands appropriately, not in acute distress  Cardiovascular: Regular rate and rhythm, no rubs, no gallops  Respiratory: Clear to auscultation bilaterally, no wheezing, no crackles, no rhonchi  Abdomen: Soft, non tender, non distended, bowel sounds present, no guarding  Extremities: No edema, pulses DP and PT palpable bilaterally  Neuro: Grossly nonfocal  Data Reviewed: Basic Metabolic Panel:  Recent Labs Lab 09/23/13 1800 09/23/13 2205 09/24/13 0444 09/25/13 0408  NA 144  --  145 145  K 4.2  --  3.6* 3.1*  CL 102  --  106 107  CO2 25  --  23 25  GLUCOSE 125*  --  97 107*  BUN 51*  --  35* 18  CREATININE 1.53* 1.17* 0.93 0.68  CALCIUM 9.6  --  8.8 9.0   Liver Function Tests:  Recent Labs Lab 09/23/13 1800 09/24/13 0444 09/25/13 0408  AST 44* 38* 36  ALT 27 24 29   ALKPHOS 80 66 70  BILITOT 0.8 0.8 0.6  PROT 6.6 5.7* 6.0  ALBUMIN 3.9 3.2* 3.3*    Recent Labs Lab 09/23/13 2010  AMMONIA 16   CBC:  Recent Labs Lab 09/23/13 1800 09/23/13 2205 09/24/13 0444 09/25/13 0408  WBC 12.1* 10.3 9.7 8.6  NEUTROABS 10.8*  --  7.9* 6.5  HGB 10.9* 10.2* 9.8* 10.6*  HCT 32.4* 29.9* 29.3* 32.5*  MCV 88.5 88.5 89.6 91.5  PLT 201 187 187 219   Cardiac Enzymes:  Recent Labs Lab 09/23/13 2205 09/25/13 0408  CKTOTAL 1216* 618*  CKMB 16.6* 12.5*     Scheduled Meds: . aspirin EC  81 mg Oral Daily  . buPROPion  450 mg Oral Daily  . cefTRIAXone (ROCEPHIN)  IV  1 g Intravenous Q24H  . lamoTRIgine  25 mg Oral Daily  .  pantoprazole  80 mg Oral Q1200  . sertraline  200 mg Oral Daily   Continuous Infusions: . sodium chloride 50 mL/hr at 09/24/13 1830   Daphnie Venturini Barbera Setters, MD  [facility] Pager [phone]  If 7PM-7AM, please contact night-coverage [email or web address] Password [email or web address] 09/25/2013, 3:59 PM   LOS: 2 days

## 2013-09-26 LAB — CBC WITH DIFFERENTIAL/PLATELET
BASOS ABS: 0 10*3/uL (ref 0.0–0.1)
Basophils Relative: 0 % (ref 0–1)
EOS PCT: 4 % (ref 0–5)
Eosinophils Absolute: 0.3 10*3/uL (ref 0.0–0.7)
HCT: 30.2 % — ABNORMAL LOW (ref 36.0–46.0)
Hemoglobin: 10 g/dL — ABNORMAL LOW (ref 12.0–15.0)
LYMPHS PCT: 16 % (ref 12–46)
Lymphs Abs: 1.2 10*3/uL (ref 0.7–4.0)
MCH: 30 pg (ref 26.0–34.0)
MCHC: 33.1 g/dL (ref 30.0–36.0)
MCV: 90.7 fL (ref 78.0–100.0)
Monocytes Absolute: 0.5 10*3/uL (ref 0.1–1.0)
Monocytes Relative: 7 % (ref 3–12)
Neutro Abs: 5.3 10*3/uL (ref 1.7–7.7)
Neutrophils Relative %: 73 % (ref 43–77)
PLATELETS: 222 10*3/uL (ref 150–400)
RBC: 3.33 MIL/uL — ABNORMAL LOW (ref 3.87–5.11)
RDW: 13.5 % (ref 11.5–15.5)
WBC: 7.3 10*3/uL (ref 4.0–10.5)

## 2013-09-26 LAB — COMPREHENSIVE METABOLIC PANEL WITH GFR
ALT: 27 U/L (ref 0–35)
AST: 26 U/L (ref 0–37)
Albumin: 3.2 g/dL — ABNORMAL LOW (ref 3.5–5.2)
Alkaline Phosphatase: 66 U/L (ref 39–117)
BUN: 11 mg/dL (ref 6–23)
CO2: 24 meq/L (ref 19–32)
Calcium: 8.8 mg/dL (ref 8.4–10.5)
Chloride: 110 meq/L (ref 96–112)
Creatinine, Ser: 0.6 mg/dL (ref 0.50–1.10)
GFR calc Af Amer: >90 mL/min
GFR calc non Af Amer: >90 mL/min
Glucose, Bld: 102 mg/dL — ABNORMAL HIGH (ref 70–99)
Potassium: 3.1 meq/L — ABNORMAL LOW (ref 3.7–5.3)
Sodium: 149 meq/L — ABNORMAL HIGH (ref 137–147)
Total Bilirubin: 0.6 mg/dL (ref 0.3–1.2)
Total Protein: 5.6 g/dL — ABNORMAL LOW (ref 6.0–8.3)

## 2013-09-26 LAB — URINE CULTURE
Colony Count: NO GROWTH
Culture: NO GROWTH

## 2013-09-26 MED ORDER — POTASSIUM CHLORIDE CRYS ER 20 MEQ PO TBCR
40.0000 meq | EXTENDED_RELEASE_TABLET | Freq: Once | ORAL | Status: AC
  Administered 2013-09-26: 40 meq via ORAL
  Filled 2013-09-26: qty 2

## 2013-09-26 NOTE — Progress Notes (Signed)
Patient ID: Angelica Kelley, female   DOB: [birth year], 68 y.o.   MRN: [id number]  [facility] PROGRESS NOTE  Angelica Kelley:[id number] DOB: 08-24-1945 DOA: 09/23/2013 PCP: Alesia Richards, MD  Brief narrative:  68 y.o. year old female with prior history of depression, remote history of alcohol abuse, anxiety, hypertension presented with altered mental status that was initially noted several days prior to this admission, associated with multiple falls at home. Please note that pt was unable to provide detailed history due to confusion.   Active Problems:  Encephalopathy, acute  - appears to be resolving but still somewhat confused this AM - pt is A&O x 3, follows commands appropriately  - PT/OT eval, may need SNF but pt wants to go home  - discussed with niece over the phone, she agrees with SNF  Hypernatremia - this AM noted on blood work  - will continue IVF and encourage PO intake  - repeat BMP in AM Elevated CK MB, CK total  - possibly related to rhabdo in the setting of falls  - trending down towards the target range  - will continue IVF and repeat CK MB in AM  Frequent falls  - secondary to progressive FTT and deconditioning  - bilateral hips XRAY negative for fractures  - will proceed with MRI lumbar and cervical spine as ortho planned to get in an outpatient setting  - PT/OT as noted above  ? UTI  - follow up on urine culture  - continue Rocephin IV  Acute renal failure  - IVF provided and pt has responded well  - Cr is now WNL  Hypokalemia  - will supplement and repeat BMP in AM  Acute on chronic blood loss anemia  - no signs of active bleeding  - CBC in AM  Transaminitis  - possibly from rhabdo  - now resolved  - CMET in AM   Consultants:  None  Procedures/Studies:  Dg Chest 2 View 09/23/2013 No active cardiopulmonary disease.  Ct Head Wo Contrast 09/23/2013 Scalp hematomas bilaterally in the posterior parietal regions. No acute intracranial abnormality  is noted.  Antibiotics:  Rocephin 5/14 -->   Code Status: Full  Family Communication: Pt at bedside  Disposition Plan: To be determined but likely SNF  HPI/Subjective: No events overnight.   Objective: Filed Vitals:   09/25/13 0619 09/25/13 1348 09/25/13 2103 09/26/13 0626  BP: 124/51 107/47 136/57 128/53  Pulse: 60 70 70 67  Temp: 98.1 F (36.7 C) 97.4 F (36.3 C) 97.9 F (36.6 C) 98.3 F (36.8 C)  TempSrc: Oral Oral Oral Oral  Resp: 18 19 17 16   Height:      Weight: 74.39 kg (164 lb)   74.435 kg (164 lb 1.6 oz)  SpO2: 94% 98% 100% 98%    Intake/Output Summary (Last 24 hours) at 09/26/13 0805 Last data filed at 09/25/13 1900  Gross per 24 hour  Intake      0 ml  Output    250 ml  Net   -250 ml    Exam:   General:  Pt is alert, follows commands appropriately, not in acute distress  Cardiovascular: Regular rate and rhythm, S1/S2, no murmurs, no rubs, no gallops  Respiratory: Clear to auscultation bilaterally, no wheezing, no crackles, no rhonchi  Abdomen: Soft, non tender, non distended, bowel sounds present, no guarding  Extremities: No edema, pulses DP and PT palpable bilaterally  Neuro: Grossly nonfocal  Data Reviewed: Basic Metabolic Panel:  Recent Labs Lab  09/23/13 1800 09/23/13 2205 09/24/13 0444 09/25/13 0408 09/26/13 0550  NA 144  --  145 145 149*  K 4.2  --  3.6* 3.1* 3.1*  CL 102  --  106 107 110  CO2 25  --  23 25 24   GLUCOSE 125*  --  97 107* 102*  BUN 51*  --  35* 18 11  CREATININE 1.53* 1.17* 0.93 0.68 0.60  CALCIUM 9.6  --  8.8 9.0 8.8   Liver Function Tests:  Recent Labs Lab 09/23/13 1800 09/24/13 0444 09/25/13 0408 09/26/13 0550  AST 44* 38* 36 26  ALT 27 24 29 27   ALKPHOS 80 66 70 66  BILITOT 0.8 0.8 0.6 0.6  PROT 6.6 5.7* 6.0 5.6*  ALBUMIN 3.9 3.2* 3.3* 3.2*    Recent Labs Lab 09/23/13 2010  AMMONIA 16   CBC:  Recent Labs Lab 09/23/13 1800 09/23/13 2205 09/24/13 0444 09/25/13 0408 09/26/13 0550  WBC  12.1* 10.3 9.7 8.6 7.3  NEUTROABS 10.8*  --  7.9* 6.5 5.3  HGB 10.9* 10.2* 9.8* 10.6* 10.0*  HCT 32.4* 29.9* 29.3* 32.5* 30.2*  MCV 88.5 88.5 89.6 91.5 90.7  PLT 201 187 187 219 222   Cardiac Enzymes:  Recent Labs Lab 09/23/13 2205 09/25/13 0408  CKTOTAL 1216* 618*  CKMB 16.6* 12.5*   Recent Results (from the past 240 hour(s))  URINE CULTURE     Status: None   Collection Time    09/24/13 10:19 PM      Result Value Ref Range Status   Specimen Description URINE, RANDOM   Final   Special Requests NONE   Final   Culture  Setup Time     Final   Value: 09/25/2013 00:11     Performed at [facility]     Final   Value: NO GROWTH     Performed at [facility]   Culture     Final   Value: NO GROWTH     Performed at [facility]   Report Status 09/26/2013 FINAL   Final    Scheduled Meds: . aspirin EC  81 mg Oral Daily  . buPROPion  450 mg Oral Daily  . cefTRIAXone  IV  1 g Intravenous Q24H  . lamoTRIgine  25 mg Oral Daily  . pantoprazole  80 mg Oral Q1200  . sertraline  200 mg Oral Daily   Continuous Infusions: . sodium chloride 50 mL/hr at 09/26/13 0024   Theodis Blaze, MD  [facility] Pager [phone]  If 7PM-7AM, please contact night-coverage [email or web address] Password [email or web address] 09/26/2013, 8:05 AM   LOS: 3 days

## 2013-09-27 ENCOUNTER — Inpatient Hospital Stay (HOSPITAL_COMMUNITY): Payer: Medicare Other

## 2013-09-27 LAB — CBC WITH DIFFERENTIAL/PLATELET
BASOS PCT: 0 % (ref 0–1)
Basophils Absolute: 0 10*3/uL (ref 0.0–0.1)
EOS PCT: 2 % (ref 0–5)
Eosinophils Absolute: 0.2 10*3/uL (ref 0.0–0.7)
HEMATOCRIT: 31.8 % — AB (ref 36.0–46.0)
Hemoglobin: 10.5 g/dL — ABNORMAL LOW (ref 12.0–15.0)
Lymphocytes Relative: 11 % — ABNORMAL LOW (ref 12–46)
Lymphs Abs: 1 10*3/uL (ref 0.7–4.0)
MCH: 30 pg (ref 26.0–34.0)
MCHC: 33 g/dL (ref 30.0–36.0)
MCV: 90.9 fL (ref 78.0–100.0)
MONO ABS: 0.6 10*3/uL (ref 0.1–1.0)
Monocytes Relative: 6 % (ref 3–12)
Neutro Abs: 7.2 10*3/uL (ref 1.7–7.7)
Neutrophils Relative %: 81 % — ABNORMAL HIGH (ref 43–77)
Platelets: 232 10*3/uL (ref 150–400)
RBC: 3.5 MIL/uL — ABNORMAL LOW (ref 3.87–5.11)
RDW: 13.5 % (ref 11.5–15.5)
WBC: 9 10*3/uL (ref 4.0–10.5)

## 2013-09-27 LAB — COMPREHENSIVE METABOLIC PANEL
ALBUMIN: 3.4 g/dL — AB (ref 3.5–5.2)
ALK PHOS: 71 U/L (ref 39–117)
ALT: 26 U/L (ref 0–35)
AST: 26 U/L (ref 0–37)
BUN: 9 mg/dL (ref 6–23)
CO2: 24 mEq/L (ref 19–32)
Calcium: 9 mg/dL (ref 8.4–10.5)
Chloride: 110 mEq/L (ref 96–112)
Creatinine, Ser: 0.59 mg/dL (ref 0.50–1.10)
GFR calc Af Amer: >90 mL/min (ref >90)
GFR calc non Af Amer: >90 mL/min (ref >90)
GLUCOSE: 98 mg/dL (ref 70–99)
POTASSIUM: 3 meq/L — AB (ref 3.7–5.3)
Sodium: 149 mEq/L — ABNORMAL HIGH (ref 137–147)
TOTAL PROTEIN: 5.9 g/dL — AB (ref 6.0–8.3)
Total Bilirubin: 0.7 mg/dL (ref 0.3–1.2)

## 2013-09-27 LAB — CK TOTAL AND CKMB (NOT AT ARMC)
CK, MB: 10.3 ng/mL (ref 0.3–4.0)
Relative Index: 5.3 — ABNORMAL HIGH (ref 0.0–2.5)
Total CK: 193 U/L — ABNORMAL HIGH (ref 7–177)

## 2013-09-27 MED ORDER — LORAZEPAM 2 MG/ML IJ SOLN
0.5000 mg | Freq: Three times a day (TID) | INTRAMUSCULAR | Status: DC | PRN
  Administered 2013-09-27 – 2013-09-29 (×3): 0.5 mg via INTRAVENOUS
  Filled 2013-09-27 (×3): qty 1

## 2013-09-27 MED ORDER — LORAZEPAM 2 MG/ML IJ SOLN
INTRAMUSCULAR | Status: AC
  Filled 2013-09-27: qty 1

## 2013-09-27 MED ORDER — POTASSIUM CHLORIDE IN NACL 20-0.45 MEQ/L-% IV SOLN
INTRAVENOUS | Status: DC
  Administered 2013-09-27: 16:00:00 via INTRAVENOUS
  Filled 2013-09-27 (×4): qty 1000

## 2013-09-27 MED ORDER — LORAZEPAM 2 MG/ML IJ SOLN
0.5000 mg | Freq: Once | INTRAMUSCULAR | Status: AC
  Administered 2013-09-27: 0.5 mg via INTRAVENOUS

## 2013-09-27 NOTE — Progress Notes (Signed)
Pt very confused standing at bedside talking about going home now.  Unable to rationalize with pt . Pt insisting that she is leaving now. Np on call notified reguarding pt behavior. Niece libby notified of patient  To be placed in restraints.. She stated it was ok.  i gave the option of family coming to be with the patient.

## 2013-09-27 NOTE — Progress Notes (Signed)
Patient ID: Angelica Kelley, female   DOB: 09/29/1945, 69 y.o.   MRN: [id number]  [facility] PROGRESS NOTE  Angelica Kelley VXB:[id number] DOB: 1945-06-20 DOA: 09/23/2013 PCP: Alesia Richards, MD  Brief narrative:  68 y.o. year old female with prior history of depression, remote history of alcohol abuse, anxiety, hypertension presented with altered mental status that was initially noted several days prior to this admission, associated with multiple falls at home. Please note that pt was unable to provide detailed history due to confusion.   Active Problems:  Encephalopathy, acute  - appears to be resolving but still confused this AM, requiring 4 point restraints - will place on ativan as needed but I feels she will be fine if restraints are removed  - pt is A&O to name only, follows commands appropriately  - PT/OT eval, will need SNF but pt wants to go home  - discussed with niece over the phone, she agrees with SNF  Hypernatremia  - unchanged since yesterday - change IVF to 1/2 NS - repeat BMP in AM  Elevated CK MB, CK total  - possibly related to rhabdo in the setting of falls  - trending down towards the target range  - will continue IVF and repeat CK MB in AM  Frequent falls  - secondary to progressive FTT and deconditioning  - bilateral hips XRAY negative for fractures  - will proceed with MRI lumbar and cervical spine as ortho planned to get in an outpatient setting, still not done   - PT/OT as noted above  ? UTI  - follow up on urine culture  - continue Rocephin IV  Acute renal failure  - IVF provided and pt has responded well  - Cr is now WNL  Hypokalemia  - will supplement and repeat BMP in AM  Acute on chronic blood loss anemia  - no signs of active bleeding, Hg/Hct remains stable and at pt's baseline  - CBC in AM  Transaminitis  - possibly from rhabdo  - now resolved  - CMET in AM   Consultants:  None  Procedures/Studies:  Dg Chest 2 View 09/23/2013 No  active cardiopulmonary disease.  Ct Head Wo Contrast 09/23/2013 Scalp hematomas bilaterally in the posterior parietal regions. No acute intracranial abnormality is noted.  Antibiotics:  Rocephin 5/14 -->   Code Status: Full  Family Communication: Pt at bedside  Disposition Plan: To be determined but likely SNF   HPI/Subjective: No events overnight.   Objective: Filed Vitals:   09/26/13 0626 09/26/13 1348 09/26/13 2100 09/27/13 0615  BP: 128/53 148/66 135/59 149/63  Pulse: 67 87 72 78  Temp: 98.3 F (36.8 C) 97.9 F (36.6 C) 97.7 F (36.5 C) 98 F (36.7 C)  TempSrc: Oral Oral Oral Oral  Resp: 16 18 17 17   Height:      Weight: 74.435 kg (164 lb 1.6 oz)   74.98 kg (165 lb 4.8 oz)  SpO2: 98% 97% 100%     Intake/Output Summary (Last 24 hours) at 09/27/13 1335 Last data filed at 09/27/13 0944  Gross per 24 hour  Intake    923 ml  Output    650 ml  Net    273 ml    Exam:   General:  Pt is alert but confused, follows some commands appropriately, not in acute distress  Cardiovascular: Regular rate and rhythm,  no rubs, no gallops  Respiratory: Clear to auscultation bilaterally, no wheezing, diminished air movement at bases   Abdomen:  Soft, non tender, non distended, bowel sounds present, no guarding  Data Reviewed: Basic Metabolic Panel:  Recent Labs Lab 09/23/13 1800 09/23/13 2205 09/24/13 0444 09/25/13 0408 09/26/13 0550 09/27/13 0527  NA 144  --  145 145 149* 149*  K 4.2  --  3.6* 3.1* 3.1* 3.0*  CL 102  --  106 107 110 110  CO2 25  --  23 25 24 24   GLUCOSE 125*  --  97 107* 102* 98  BUN 51*  --  35* 18 11 9   CREATININE 1.53* 1.17* 0.93 0.68 0.60 0.59  CALCIUM 9.6  --  8.8 9.0 8.8 9.0   Liver Function Tests:  Recent Labs Lab 09/23/13 1800 09/24/13 0444 09/25/13 0408 09/26/13 0550 09/27/13 0527  AST 44* 38* 36 26 26  ALT 27 24 29 27 26   ALKPHOS 80 66 70 66 71  BILITOT 0.8 0.8 0.6 0.6 0.7  PROT 6.6 5.7* 6.0 5.6* 5.9*  ALBUMIN 3.9 3.2* 3.3*  3.2* 3.4*    Recent Labs Lab 09/23/13 2010  AMMONIA 16   CBC:  Recent Labs Lab 09/23/13 1800 09/23/13 2205 09/24/13 0444 09/25/13 0408 09/26/13 0550 09/27/13 0527  WBC 12.1* 10.3 9.7 8.6 7.3 9.0  NEUTROABS 10.8*  --  7.9* 6.5 5.3 7.2  HGB 10.9* 10.2* 9.8* 10.6* 10.0* 10.5*  HCT 32.4* 29.9* 29.3* 32.5* 30.2* 31.8*  MCV 88.5 88.5 89.6 91.5 90.7 90.9  PLT 201 187 187 219 222 232   Cardiac Enzymes:  Recent Labs Lab 09/23/13 2205 09/25/13 0408 09/27/13 0527  CKTOTAL 1216* 618* 193*  CKMB 16.6* 12.5* 10.3*    Recent Results (from the past 240 hour(s))  URINE CULTURE     Status: None   Collection Time    09/24/13 10:19 PM      Result Value Ref Range Status   Specimen Description URINE, RANDOM   Final   Special Requests NONE   Final   Culture  Setup Time     Final   Value: 09/25/2013 00:11     Performed at [facility]     Final   Value: NO GROWTH     Performed at [facility]   Culture     Final   Value: NO GROWTH     Performed at [facility]   Report Status 09/26/2013 FINAL   Final     Scheduled Meds: . aspirin EC  81 mg Oral Daily  . buPROPion  450 mg Oral Daily  . cefTRIAXone (ROCEPHIN)  IV  1 g Intravenous Q24H  . lamoTRIgine  25 mg Oral Daily  . pantoprazole  80 mg Oral Q1200  . sertraline  200 mg Oral Daily   Continuous Infusions: . sodium chloride 50 mL/hr at 09/26/13 1925   Theodis Blaze, MD  [facility] Pager [phone]  If 7PM-7AM, please contact night-coverage [email or web address] Password [email or web address] 09/27/2013, 1:35 PM   LOS: 4 days

## 2013-09-28 LAB — CBC WITH DIFFERENTIAL/PLATELET
BASOS ABS: 0 10*3/uL (ref 0.0–0.1)
Basophils Relative: 0 % (ref 0–1)
Eosinophils Absolute: 0.2 10*3/uL (ref 0.0–0.7)
Eosinophils Relative: 1 % (ref 0–5)
HCT: 33.9 % — ABNORMAL LOW (ref 36.0–46.0)
HEMOGLOBIN: 11.1 g/dL — AB (ref 12.0–15.0)
Lymphocytes Relative: 10 % — ABNORMAL LOW (ref 12–46)
Lymphs Abs: 1.1 10*3/uL (ref 0.7–4.0)
MCH: 29.9 pg (ref 26.0–34.0)
MCHC: 32.7 g/dL (ref 30.0–36.0)
MCV: 91.4 fL (ref 78.0–100.0)
MONOS PCT: 7 % (ref 3–12)
Monocytes Absolute: 0.8 10*3/uL (ref 0.1–1.0)
NEUTROS ABS: 9 10*3/uL — AB (ref 1.7–7.7)
NEUTROS PCT: 82 % — AB (ref 43–77)
Platelets: 256 10*3/uL (ref 150–400)
RBC: 3.71 MIL/uL — AB (ref 3.87–5.11)
RDW: 13.6 % (ref 11.5–15.5)
WBC: 11.1 10*3/uL — ABNORMAL HIGH (ref 4.0–10.5)

## 2013-09-28 LAB — COMPREHENSIVE METABOLIC PANEL
ALT: 26 U/L (ref 0–35)
AST: 27 U/L (ref 0–37)
Albumin: 3.6 g/dL (ref 3.5–5.2)
Alkaline Phosphatase: 79 U/L (ref 39–117)
BILIRUBIN TOTAL: 0.7 mg/dL (ref 0.3–1.2)
BUN: 10 mg/dL (ref 6–23)
CALCIUM: 9.4 mg/dL (ref 8.4–10.5)
CO2: 23 meq/L (ref 19–32)
CREATININE: 0.6 mg/dL (ref 0.50–1.10)
Chloride: 107 mEq/L (ref 96–112)
GLUCOSE: 105 mg/dL — AB (ref 70–99)
Potassium: 3.1 mEq/L — ABNORMAL LOW (ref 3.7–5.3)
Sodium: 147 mEq/L (ref 137–147)
Total Protein: 6.4 g/dL (ref 6.0–8.3)

## 2013-09-28 LAB — CK TOTAL AND CKMB (NOT AT ARMC)
CK, MB: 12.8 ng/mL (ref 0.3–4.0)
RELATIVE INDEX: 8.1 — AB (ref 0.0–2.5)
Total CK: 159 U/L (ref 7–177)

## 2013-09-28 LAB — GLUCOSE, CAPILLARY: Glucose-Capillary: 107 mg/dL — ABNORMAL HIGH (ref 70–99)

## 2013-09-28 MED ORDER — POTASSIUM CHLORIDE CRYS ER 20 MEQ PO TBCR
40.0000 meq | EXTENDED_RELEASE_TABLET | Freq: Once | ORAL | Status: AC
  Administered 2013-09-28: 40 meq via ORAL
  Filled 2013-09-28: qty 2

## 2013-09-28 NOTE — Progress Notes (Signed)
Thank you for consult on Ms. Angelica Kelley. She was admitted with gait disorder, confusion and FTT on 09/23/13. She lives alone and is unsafe to be d/c to home due to ongoing confusion, poor safety awareness and impulsivity. SNF recommended by Rehab team and family. Would recommend pursing this venue for further therapies. Will defer CIR consult for now.

## 2013-09-28 NOTE — Progress Notes (Addendum)
Patient ID: Angelica Kelley, female   DOB: 1945/08/23, 68 y.o.   MRN: [id number]  [facility] PROGRESS NOTE  Angelica Kelley VPX:[id number] DOB: 05-20-1945 DOA: 09/23/2013 PCP: Alesia Richards, MD  Brief narrative:  68 y.o. year old female with prior history of depression, remote history of alcohol abuse, anxiety, hypertension presented with altered mental status that was initially noted several days prior to this admission, associated with multiple falls at home. Please note that pt was unable to provide detailed history due to confusion.   Active Problems:  Encephalopathy, acute  - appears to be resolving but still confused this AM, off restraints 24 hours  - will place on ativan as needed  - pt is A&O to name only, follows commands appropriately  - PT/OT eval, will need SNF but pt wants to go home  - discussed with niece over the phone, she agrees with SNF  Hypernatremia  - responded to IVF and not WNL this AM - continue IVF to 1/2 NS  - repeat BMP in AM  Elevated CK MB, CK total  - possibly related to rhabdo in the setting of falls  - trending down towards the target range  - will continue IVF and repeat CK MB in AM  Frequent falls  - secondary to progressive FTT and deconditioning  - bilateral hips XRAY negative for fractures  - MRI lumbar and cervical spine as ortho planned to get in an outpatient setting, follow up on results  - PT/OT as noted above  ? UTI  - follow up on urine culture  - completed Rocephin for 5 days, stop after today's dose  Acute renal failure  - IVF provided and pt has responded well  - Cr is now WNL  Hypokalemia  - will continue to supplement and repeat BMP in AM  Acute on chronic blood loss anemia  - no signs of active bleeding, Hg/Hct remains stable and at pt's baseline  - CBC in AM  Transaminitis  - possibly from rhabdo  - now resolved  - CMET in AM   Consultants:  None  Procedures/Studies:  Dg Chest 2 View 09/23/2013 No active  cardiopulmonary disease.  Ct Head Wo Contrast 09/23/2013 Scalp hematomas bilaterally in the posterior parietal regions. No acute intracranial abnormality is noted.  Antibiotics:  Rocephin 5/14 --> 5/18  Code Status: Full  Family Communication: Pt at bedside  Disposition Plan: To be determined but likely SNF  HPI/Subjective: No events overnight.   Objective: Filed Vitals:   09/27/13 0615 09/27/13 1328 09/27/13 2104 09/28/13 0500  BP: 149/63 138/62 118/60 140/73  Pulse: 78 72 85 75  Temp: 98 F (36.7 C) 98.2 F (36.8 C) 97.9 F (36.6 C) 98 F (36.7 C)  TempSrc: Oral Oral Oral   Resp: 17 16 19 18   Height:      Weight: 74.98 kg (165 lb 4.8 oz)   73.664 kg (162 lb 6.4 oz)  SpO2:  98% 94% 97%    Intake/Output Summary (Last 24 hours) at 09/28/13 1403 Last data filed at 09/28/13 1108  Gross per 24 hour  Intake    600 ml  Output    750 ml  Net   -150 ml    Exam:   General:  Pt is alert, confused, follows commands appropriately, not in acute distress  Cardiovascular: Regular rate and rhythm, S1/S2, no murmurs, no rubs, no gallops  Respiratory: Clear to auscultation bilaterally, no wheezing, diminished breath sounds at bases   Abdomen: Soft,  non tender, non distended, bowel sounds present, no guarding   Data Reviewed: Basic Metabolic Panel:  Recent Labs Lab 09/24/13 0444 09/25/13 0408 09/26/13 0550 09/27/13 0527 09/28/13 0530  NA 145 145 149* 149* 147  K 3.6* 3.1* 3.1* 3.0* 3.1*  CL 106 107 110 110 107  CO2 23 25 24 24 23   GLUCOSE 97 107* 102* 98 105*  BUN 35* 18 11 9 10   CREATININE 0.93 0.68 0.60 0.59 0.60  CALCIUM 8.8 9.0 8.8 9.0 9.4   Liver Function Tests:  Recent Labs Lab 09/24/13 0444 09/25/13 0408 09/26/13 0550 09/27/13 0527 09/28/13 0530  AST 38* 36 26 26 27   ALT 24 29 27 26 26   ALKPHOS 66 70 66 71 79  BILITOT 0.8 0.6 0.6 0.7 0.7  PROT 5.7* 6.0 5.6* 5.9* 6.4  ALBUMIN 3.2* 3.3* 3.2* 3.4* 3.6    Recent Labs Lab 09/23/13 2010  AMMONIA 16    CBC:  Recent Labs Lab 09/24/13 0444 09/25/13 0408 09/26/13 0550 09/27/13 0527 09/28/13 0530  WBC 9.7 8.6 7.3 9.0 11.1*  NEUTROABS 7.9* 6.5 5.3 7.2 9.0*  HGB 9.8* 10.6* 10.0* 10.5* 11.1*  HCT 29.3* 32.5* 30.2* 31.8* 33.9*  MCV 89.6 91.5 90.7 90.9 91.4  PLT 187 219 222 232 256   Cardiac Enzymes:  Recent Labs Lab 09/23/13 2205 09/25/13 0408 09/27/13 0527 09/28/13 0530  CKTOTAL 1216* 618* 193* 159  CKMB 16.6* 12.5* 10.3* 12.8*   CBG:  Recent Labs Lab 09/28/13 0756  GLUCAP 107*    Recent Results (from the past 240 hour(s))  URINE CULTURE     Status: None   Collection Time    09/24/13 10:19 PM      Result Value Ref Range Status   Specimen Description URINE, RANDOM   Final   Special Requests NONE   Final   Culture  Setup Time     Final   Value: 09/25/2013 00:11     Performed at [place]     Final   Value: NO GROWTH     Performed at [facility]   Culture     Final   Value: NO GROWTH     Performed at [facility]   Report Status 09/26/2013 FINAL   Final     Scheduled Meds: . aspirin EC  81 mg Oral Daily  . buPROPion  450 mg Oral Daily  . cefTRIAXone (ROCEPHIN)  IV  1 g Intravenous Q24H  . lamoTRIgine  25 mg Oral Daily  . pantoprazole  80 mg Oral Q1200  . sertraline  200 mg Oral Daily   Continuous Infusions: . 0.45 % NaCl with KCl 20 mEq / L 50 mL/hr at 09/27/13 1532   Theodis Blaze, MD  [facility] Pager [phone]  If 7PM-7AM, please contact night-coverage [email or web address] Password [email or web address] 09/28/2013, 2:03 PM   LOS: 5 days
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
## 2013-09-29 ENCOUNTER — Inpatient Hospital Stay (HOSPITAL_COMMUNITY): Payer: Medicare Other

## 2013-09-29 LAB — CBC WITH DIFFERENTIAL/PLATELET
BASOS ABS: 0 10*3/uL (ref 0.0–0.1)
Basophils Relative: 0 % (ref 0–1)
EOS PCT: 2 % (ref 0–5)
Eosinophils Absolute: 0.2 10*3/uL (ref 0.0–0.7)
HEMATOCRIT: 33 % — AB (ref 36.0–46.0)
Hemoglobin: 10.9 g/dL — ABNORMAL LOW (ref 12.0–15.0)
LYMPHS ABS: 1.2 10*3/uL (ref 0.7–4.0)
LYMPHS PCT: 11 % — AB (ref 12–46)
MCH: 30.3 pg (ref 26.0–34.0)
MCHC: 33 g/dL (ref 30.0–36.0)
MCV: 91.7 fL (ref 78.0–100.0)
MONO ABS: 0.9 10*3/uL (ref 0.1–1.0)
Monocytes Relative: 8 % (ref 3–12)
NEUTROS ABS: 9.1 10*3/uL — AB (ref 1.7–7.7)
Neutrophils Relative %: 79 % — ABNORMAL HIGH (ref 43–77)
Platelets: 244 10*3/uL (ref 150–400)
RBC: 3.6 MIL/uL — AB (ref 3.87–5.11)
RDW: 14.5 % (ref 11.5–15.5)
WBC: 11.3 10*3/uL — AB (ref 4.0–10.5)

## 2013-09-29 LAB — COMPREHENSIVE METABOLIC PANEL
ALBUMIN: 3.3 g/dL — AB (ref 3.5–5.2)
ALT: 22 U/L (ref 0–35)
AST: 22 U/L (ref 0–37)
Alkaline Phosphatase: 78 U/L (ref 39–117)
BUN: 13 mg/dL (ref 6–23)
CALCIUM: 8.8 mg/dL (ref 8.4–10.5)
CO2: 23 mEq/L (ref 19–32)
Chloride: 111 mEq/L (ref 96–112)
Creatinine, Ser: 0.58 mg/dL (ref 0.50–1.10)
GFR calc Af Amer: >90 mL/min (ref >90)
GFR calc non Af Amer: >90 mL/min (ref >90)
Glucose, Bld: 106 mg/dL — ABNORMAL HIGH (ref 70–99)
POTASSIUM: 3 meq/L — AB (ref 3.7–5.3)
Sodium: 148 mEq/L — ABNORMAL HIGH (ref 137–147)
Total Bilirubin: 0.6 mg/dL (ref 0.3–1.2)
Total Protein: 5.9 g/dL — ABNORMAL LOW (ref 6.0–8.3)

## 2013-09-29 MED ORDER — OXYCODONE HCL 5 MG PO TABS: 5.0000 mg | ORAL_TABLET | ORAL | Status: DC | PRN

## 2013-09-29 MED ORDER — TRAMADOL HCL 50 MG PO TABS: 50.0000 mg | ORAL_TABLET | Freq: Three times a day (TID) | ORAL | Status: DC | PRN

## 2013-09-29 MED ORDER — POTASSIUM CHLORIDE CRYS ER 20 MEQ PO TBCR
40.0000 meq | EXTENDED_RELEASE_TABLET | Freq: Once | ORAL | Status: AC
  Administered 2013-09-29: 40 meq via ORAL
  Filled 2013-09-29: qty 2

## 2013-09-29 NOTE — Progress Notes (Signed)
Fax number for Mammie Lorenzo, daughter of Mrs Warf:   fax:  [phone] Phone: [phone]  Miss Lattie Haw wants to have medical records faxed to the above number, please follow up.  Faye Ramsay, MD  [facility] Pager [phone]  If 7PM-7AM, please contact night-coverage [email or web address] Password [email or web address]

## 2013-09-29 NOTE — Discharge Summary (Signed)
Physician Discharge Summary  Angelica Kelley WCB:[id number] DOB: [birth year] DOA: 09/23/2013  PCP: Alesia Richards, MD  Admit date: 09/23/2013 Discharge date: 09/29/2013  Recommendations for Outpatient Follow-up:  1. Pt will need to follow up with PCP in 2-3 weeks post discharge 2. Please obtain BMP to evaluate electrolytes and kidney function 3. Please also check CBC to evaluate Hg and Hct levels  Discharge Diagnoses: Falls, UTI, encephalopathy  Active Problems:   Encephalopathy  Discharge Condition: Stable  Diet recommendation: Heart healthy diet discussed in details   Brief narrative:  68 y.o. year old female with prior history of depression, remote history of alcohol abuse, anxiety, hypertension presented with altered mental status that was initially noted several days prior to this admission, associated with multiple falls at home. Please note that pt was unable to provide detailed history due to confusion.   Active Problems:  Encephalopathy, acute  - appears to be resolving and at her baseline mental status  - continue ativan as needed  - pt is A&O to name only, follows commands appropriately  - PT/OT eval, will need SNF but pt wants to go home  - discussed with niece over the phone, she agrees with SNF  - possible d/c in AM to SNF if bed available  Hypernatremia  - responded to IVF and now 148 - pt tolerating current diet well, stop IVF  - repeat BMP in AM  Elevated CK MB, CK total  - possibly related to rhabdo in the setting of falls  - trending down towards the target range  - repeat CK MB in AM  Frequent falls  - secondary to progressive FTT and deconditioning  - bilateral hips XRAY negative for fractures  - MRI lumbar and cervical spine ordered, exam somewhat limited due to pt's commotion during the exam, spine narrowing on several levels noted  - will need PT in SNF upon discharge  UTI  - completed Rocephin for 5 days, stopped 5/19 Acute renal failure  - IVF  provided and pt has responded well  - Cr is now WNL  Hypokalemia  - will continue to supplement and repeat BMP in AM  Acute on chronic blood loss anemia  - no signs of active bleeding, Hg/Hct remains stable and at pt's baseline  Transaminitis  - possibly from rhabdo  - now resolved   Consultants:  None  Procedures/Studies:  Dg Chest 2 View 09/23/2013 No active cardiopulmonary disease.  Ct Head Wo Contrast 09/23/2013 Scalp hematomas bilaterally in the posterior parietal regions. No acute intracranial abnormality is noted.  Antibiotics:  Rocephin 5/14 --> 5/19  Code Status: Full  Family Communication: Pt at bedside  Disposition Plan: SNF in AM if bed available    Discharge Exam: Filed Vitals:   09/29/13 1310  BP: 132/87  Pulse: 89  Temp: 99 F (37.2 C)  Resp: 17   Filed Vitals:   09/28/13 1300 09/28/13 2114 09/29/13 0606 09/29/13 1310  BP: 112/60 116/59 133/64 132/87  Pulse: 88 87 87 89  Temp: 97.8 F (36.6 C) 98.2 F (36.8 C) 98.5 F (36.9 C) 99 F (37.2 C)  TempSrc: Oral Oral Oral Oral  Resp: 18 19 19 17   Height:      Weight:   72.938 kg (160 lb 12.8 oz)   SpO2:  99% 97% 98%    General: Pt is alert, follows commands appropriately, not in acute distress Cardiovascular: Regular rate and rhythm, S1/S2 +, no murmurs, no rubs, no gallops Respiratory: Clear to auscultation  bilaterally, no wheezing, no crackles, no rhonchi Abdominal: Soft, non tender, non distended, bowel sounds +, no guarding Extremities: no edema, no cyanosis, pulses palpable bilaterally DP and PT  Discharge Instructions     Medication List    STOP taking these medications       methylPREDNIsolone 4 MG tablet  Commonly known as:  MEDROL DOSPACK      TAKE these medications       ALPRAZolam 1 MG tablet  Commonly known as:  XANAX  Take 1 mg by mouth 3 (three) times daily as needed. For anxiety.     aspirin EC 81 MG tablet  Take 81 mg by mouth daily.     bisoprolol-hydrochlorothiazide  5-6.25 MG per tablet  Commonly known as:  ZIAC  Take 1 tablet by mouth daily.     buPROPion 150 MG 24 hr tablet  Commonly known as:  WELLBUTRIN XL  Take 450 mg by mouth daily.     esomeprazole 40 MG capsule  Commonly known as:  NEXIUM  Take 40 mg by mouth daily at 12 noon.     gabapentin 300 MG capsule  Commonly known as:  NEURONTIN  Take 300 mg by mouth at bedtime.     HEMAX 150-1 MG Tabs  Take 150 mg by mouth daily.     hydrochlorothiazide 12.5 MG capsule  Commonly known as:  MICROZIDE  Take 12.5 mg by mouth daily.     lamoTRIgine 25 MG tablet  Commonly known as:  LAMICTAL  Take 25 mg by mouth daily.     meloxicam 15 MG tablet  Commonly known as:  MOBIC  Take 15 mg by mouth daily.     oxyCODONE 5 MG immediate release tablet  Commonly known as:  Oxy IR/ROXICODONE  Take 1 tablet (5 mg total) by mouth every 4 (four) hours as needed for moderate pain or severe pain.     rosuvastatin 40 MG tablet  Commonly known as:  CRESTOR  Take 20 mg by mouth daily.     sertraline 100 MG tablet  Commonly known as:  ZOLOFT  Take 2 tablets (200 mg total) by mouth daily.     traMADol 50 MG tablet  Commonly known as:  ULTRAM  Take 1 tablet (50 mg total) by mouth 3 (three) times daily as needed for moderate pain.     Vitamin D3 5000 UNITS Caps  Take 5,000 Units by mouth daily.           Follow-up Information   Schedule an appointment as soon as possible for a visit with Alesia Richards, MD.   Specialty:  Internal Medicine   Contact information:   [address] [place] [address] [phone]        The results of significant diagnostics from this hospitalization (including imaging, microbiology, ancillary and laboratory) are listed below for reference.     Microbiology: Recent Results (from the past 240 hour(s))  URINE CULTURE     Status: None   Collection Time    09/24/13 10:19 PM      Result Value Ref Range Status   Specimen Description  URINE, RANDOM   Final   Special Requests NONE   Final   Culture  Setup Time     Final   Value: 09/25/2013 00:11     Performed at [place]     Final   Value: NO GROWTH     Performed at [facility]  Culture     Final   Value: NO GROWTH     Performed at [facility]   Report Status 09/26/2013 FINAL   Final     Labs: Basic Metabolic Panel:  Recent Labs Lab 09/25/13 0408 09/26/13 0550 09/27/13 0527 09/28/13 0530 09/29/13 0624  NA 145 149* 149* 147 148*  K 3.1* 3.1* 3.0* 3.1* 3.0*  CL 107 110 110 107 111  CO2 25 24 24 23 23   GLUCOSE 107* 102* 98 105* 106*  BUN 18 11 9 10 13   CREATININE 0.68 0.60 0.59 0.60 0.58  CALCIUM 9.0 8.8 9.0 9.4 8.8   Liver Function Tests:  Recent Labs Lab 09/25/13 0408 09/26/13 0550 09/27/13 0527 09/28/13 0530 09/29/13 0624  AST 36 26 26 27 22   ALT 29 27 26 26 22   ALKPHOS 70 66 71 79 78  BILITOT 0.6 0.6 0.7 0.7 0.6  PROT 6.0 5.6* 5.9* 6.4 5.9*  ALBUMIN 3.3* 3.2* 3.4* 3.6 3.3*    Recent Labs Lab 09/23/13 2010  AMMONIA 16   CBC:  Recent Labs Lab 09/25/13 0408 09/26/13 0550 09/27/13 0527 09/28/13 0530 09/29/13 0624  WBC 8.6 7.3 9.0 11.1* 11.3*  NEUTROABS 6.5 5.3 7.2 9.0* 9.1*  HGB 10.6* 10.0* 10.5* 11.1* 10.9*  HCT 32.5* 30.2* 31.8* 33.9* 33.0*  MCV 91.5 90.7 90.9 91.4 91.7  PLT 219 222 232 256 244   Cardiac Enzymes:  Recent Labs Lab 09/23/13 2205 09/25/13 0408 09/27/13 0527 09/28/13 0530  CKTOTAL 1216* 618* 193* 159  CKMB 16.6* 12.5* 10.3* 12.8*   CBG:  Recent Labs Lab 09/28/13 0756  GLUCAP 107*   SIGNED: Time coordinating discharge: Over 30 minutes  Theodis Blaze, MD  [facility] 09/29/2013, 6:05 PM Pager [phone]  If 7PM-7AM, please contact night-coverage [email or web address] Password [email or web address]

## 2013-09-29 NOTE — Discharge Instructions (Signed)
Fall Prevention and Home Safety °Falls cause injuries and can affect all age groups. It is possible to prevent falls.  °HOW TO PREVENT FALLS °· Wear shoes with rubber soles that do not have an opening for your toes. °· Keep the inside and outside of your house well lit. °· Use night lights throughout your home. °· Remove clutter from floors. °· Clean up floor spills. °· Remove throw rugs or fasten them to the floor with carpet tape. °· Do not place electrical cords across pathways. °· Put grab bars by your tub, shower, and toilet. Do not use towel bars as grab bars. °· Put handrails on both sides of the stairway. Fix loose handrails. °· Do not climb on stools or stepladders, if possible. °· Do not wax your floors. °· Repair uneven or unsafe sidewalks, walkways, or stairs. °· Keep items you use a lot within reach. °· Be aware of pets. °· Keep emergency numbers next to the telephone. °· Put smoke detectors in your home and near bedrooms. °Ask your doctor what other things you can do to prevent falls. °Document Released: 02/24/2009 Document Revised: 10/30/2011 Document Reviewed: 07/31/2011 °ExitCare® Patient Information ©2014 [employer]. ° °

## 2013-09-30 DIAGNOSIS — I10 Essential (primary) hypertension: Secondary | ICD-10-CM

## 2013-09-30 DIAGNOSIS — F329 Major depressive disorder, single episode, unspecified: Secondary | ICD-10-CM

## 2013-09-30 DIAGNOSIS — F411 Generalized anxiety disorder: Secondary | ICD-10-CM

## 2013-09-30 DIAGNOSIS — F3289 Other specified depressive episodes: Secondary | ICD-10-CM

## 2013-09-30 LAB — COMPREHENSIVE METABOLIC PANEL
ALT: 18 U/L (ref 0–35)
AST: 20 U/L (ref 0–37)
Albumin: 3 g/dL — ABNORMAL LOW (ref 3.5–5.2)
Alkaline Phosphatase: 79 U/L (ref 39–117)
BILIRUBIN TOTAL: 0.5 mg/dL (ref 0.3–1.2)
BUN: 11 mg/dL (ref 6–23)
CALCIUM: 8.5 mg/dL (ref 8.4–10.5)
CHLORIDE: 111 meq/L (ref 96–112)
CO2: 24 meq/L (ref 19–32)
Creatinine, Ser: 0.59 mg/dL (ref 0.50–1.10)
GFR calc Af Amer: >90 mL/min (ref >90)
GFR calc non Af Amer: >90 mL/min (ref >90)
GLUCOSE: 102 mg/dL — AB (ref 70–99)
Potassium: 3 mEq/L — ABNORMAL LOW (ref 3.7–5.3)
Sodium: 147 mEq/L (ref 137–147)
Total Protein: 5.4 g/dL — ABNORMAL LOW (ref 6.0–8.3)

## 2013-09-30 LAB — CBC WITH DIFFERENTIAL/PLATELET
BASOS ABS: 0.1 10*3/uL (ref 0.0–0.1)
BASOS PCT: 1 % (ref 0–1)
EOS ABS: 0.3 10*3/uL (ref 0.0–0.7)
Eosinophils Relative: 4 % (ref 0–5)
HCT: 31.8 % — ABNORMAL LOW (ref 36.0–46.0)
HEMOGLOBIN: 10.5 g/dL — AB (ref 12.0–15.0)
Lymphocytes Relative: 18 % (ref 12–46)
Lymphs Abs: 1.5 10*3/uL (ref 0.7–4.0)
MCH: 30.6 pg (ref 26.0–34.0)
MCHC: 33 g/dL (ref 30.0–36.0)
MCV: 92.7 fL (ref 78.0–100.0)
Monocytes Absolute: 0.6 10*3/uL (ref 0.1–1.0)
Monocytes Relative: 8 % (ref 3–12)
NEUTROS ABS: 5.8 10*3/uL (ref 1.7–7.7)
Neutrophils Relative %: 69 % (ref 43–77)
PLATELETS: 215 10*3/uL (ref 150–400)
RBC: 3.43 MIL/uL — AB (ref 3.87–5.11)
RDW: 14.8 % (ref 11.5–15.5)
WBC: 8.3 10*3/uL (ref 4.0–10.5)

## 2013-09-30 LAB — CK TOTAL AND CKMB (NOT AT ARMC)
CK TOTAL: 75 U/L (ref 7–177)
CK, MB: 7.8 ng/mL — AB (ref 0.3–4.0)
RELATIVE INDEX: INVALID (ref 0.0–2.5)

## 2013-09-30 LAB — MAGNESIUM: Magnesium: 1.8 mg/dL (ref 1.5–2.5)

## 2013-09-30 MED ORDER — POTASSIUM CHLORIDE CRYS ER 20 MEQ PO TBCR
40.0000 meq | EXTENDED_RELEASE_TABLET | Freq: Once | ORAL | Status: AC
  Administered 2013-09-30: 40 meq via ORAL
  Filled 2013-09-30: qty 2

## 2013-09-30 MED ORDER — ALPRAZOLAM 0.5 MG PO TABS
0.5000 mg | ORAL_TABLET | Freq: Three times a day (TID) | ORAL | Status: DC | PRN
  Administered 2013-09-30: 0.5 mg via ORAL
  Filled 2013-09-30: qty 1
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
## 2013-09-30 NOTE — Clinical Social Work Note (Signed)
Covering CSW received report of SNF placement for pt at time of discharge. Per chart review, pt refusing SNF placement. RNCM confirmed with CSW of pt's discharge disposition of home with home health services. CSW signing off.  Pati Gallo, [place] Social Worker [phone]

## 2013-09-30 NOTE — Progress Notes (Signed)
   Patient seen and examined, stable for discharge. Patient does not want to be discharged to a nursing home facility. Patient and her niece, who is her POA, have both refused skilled nursing facility, which was a recommendation by physical therapy.  Patient home alone. She will be discharged to home with home health physical therapy, nursing, social and case manager.  Please see full discharge summary done by Dr. Mart Piggs on 09/29/2013.     Talyn Dessert D.O. on 09/30/2013 at 8:06 AM  Between 7am to 7pm - Pager - [phone]  After 7pm go to [email or web address] - password [email or web address]  And look for the night coverage person covering for me after hours  [facility] Office  [phone]
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
## 2013-09-30 NOTE — Progress Notes (Signed)
Discharge instructions explained and given to pt. And also explained to pt.'s family member.  Both verbalize understanding of all instructions/orders and deny any questions.  IV removed by NT.  Pt. Getting dressed and leaving with family member. Angelica Kelley

## 2013-09-30 NOTE — Care Management Note (Signed)
  Page 1 of 1   09/30/2013     12:38:25 PM CARE MANAGEMENT NOTE 09/30/2013  Patient:  Angelica Kelley, Angelica Kelley   Account Number:  [id number]  Date Initiated:  09/28/2013  Documentation initiated by:  Magdalen Spatz  Subjective/Objective Assessment:     Action/Plan:   Anticipated DC Date:  09/30/2013   Anticipated DC Plan:  [place]  In-house referral  Clinical Social Worker         Choice offered to / List presented to:  C-1 Patient   DME arranged  [facility]      DME agency  [place] arranged  HH-1 RN  [place]      [place].   Status of service:  Completed, signed off Medicare Important Message given?   (If response is "NO", the following Medicare IM given date fields will be blank) Date Medicare IM given:   Date Additional Medicare IM given:  09/28/2013  Discharge Disposition:    Per UR Regulation:    If discussed at Long Length of Stay Meetings, dates discussed:   09/29/2013    Comments:  09-30-13 Spoke with patient and family at bedside. Confirmed face sheet information.  Patient and family do not want bedside comode only a cane . Same ordered and [facility] aware discharging . Magdalen Spatz RN BSN [phone]

## 2013-10-02 NOTE — ED Provider Notes (Signed)
I saw and evaluated the patient, reviewed the resident's note and I agree with the findings and plan.   .Face to face Exam:  General:  Awake HEENT:  Atraumatic Resp:  Normal effort Abd:  Nondistended Neuro:No focal weakness   Dot Lanes, MD 10/02/13 [phone]

## 2013-10-06 ENCOUNTER — Telehealth: Payer: Self-pay | Admitting: *Deleted

## 2013-10-06 DIAGNOSIS — M79609 Pain in unspecified limb: Secondary | ICD-10-CM

## 2013-10-06 DIAGNOSIS — R269 Unspecified abnormalities of gait and mobility: Secondary | ICD-10-CM

## 2013-10-06 DIAGNOSIS — M6281 Muscle weakness (generalized): Secondary | ICD-10-CM

## 2013-10-06 DIAGNOSIS — F329 Major depressive disorder, single episode, unspecified: Secondary | ICD-10-CM

## 2013-10-06 DIAGNOSIS — F3289 Other specified depressive episodes: Secondary | ICD-10-CM

## 2013-10-06 NOTE — Telephone Encounter (Signed)
Diclofenac = causing hives & swelling feet & legs .  Can we replace with something else      [facility] [address]

## 2013-10-08 ENCOUNTER — Telehealth: Payer: Self-pay

## 2013-10-08 NOTE — Telephone Encounter (Signed)
ZMCEYEM @ [facility] found pt on the ground this morning. She states there are no injuries. Requesting verbal ok for pt to have 4 wheel rolling walker. Per Dr. Melford Aase, it is ok. VVKPQAE aware.

## 2013-10-27 ENCOUNTER — Encounter: Payer: Self-pay | Admitting: Internal Medicine

## 2013-10-27 ENCOUNTER — Ambulatory Visit (INDEPENDENT_AMBULATORY_CARE_PROVIDER_SITE_OTHER): Payer: Medicare Other | Admitting: Internal Medicine

## 2013-10-27 VITALS — BP 106/66 | HR 64 | Temp 98.1°F | Resp 16 | Ht 65.25 in | Wt 155.0 lb

## 2013-10-27 DIAGNOSIS — I10 Essential (primary) hypertension: Secondary | ICD-10-CM

## 2013-10-27 DIAGNOSIS — E785 Hyperlipidemia, unspecified: Secondary | ICD-10-CM

## 2013-10-27 DIAGNOSIS — Z79899 Other long term (current) drug therapy: Secondary | ICD-10-CM | POA: Insufficient documentation

## 2013-10-27 DIAGNOSIS — E559 Vitamin D deficiency, unspecified: Secondary | ICD-10-CM | POA: Insufficient documentation

## 2013-10-27 DIAGNOSIS — F319 Bipolar disorder, unspecified: Secondary | ICD-10-CM

## 2013-10-27 DIAGNOSIS — R7309 Other abnormal glucose: Secondary | ICD-10-CM

## 2013-10-27 LAB — CBC WITH DIFFERENTIAL/PLATELET
BASOS PCT: 1 % (ref 0–1)
Basophils Absolute: 0.1 10*3/uL (ref 0.0–0.1)
EOS ABS: 0.1 10*3/uL (ref 0.0–0.7)
Eosinophils Relative: 2 % (ref 0–5)
HEMATOCRIT: 37.1 % (ref 36.0–46.0)
HEMOGLOBIN: 12.6 g/dL (ref 12.0–15.0)
Lymphocytes Relative: 23 % (ref 12–46)
Lymphs Abs: 1.4 10*3/uL (ref 0.7–4.0)
MCH: 30.3 pg (ref 26.0–34.0)
MCHC: 34 g/dL (ref 30.0–36.0)
MCV: 89.2 fL (ref 78.0–100.0)
MONOS PCT: 7 % (ref 3–12)
Monocytes Absolute: 0.4 10*3/uL (ref 0.1–1.0)
NEUTROS ABS: 4 10*3/uL (ref 1.7–7.7)
Neutrophils Relative %: 67 % (ref 43–77)
Platelets: 178 10*3/uL (ref 150–400)
RBC: 4.16 MIL/uL (ref 3.87–5.11)
RDW: 13.6 % (ref 11.5–15.5)
WBC: 5.9 10*3/uL (ref 4.0–10.5)

## 2013-10-27 MED ORDER — LAMOTRIGINE 25 MG PO TABS: ORAL_TABLET | ORAL | Status: DC

## 2013-10-27 NOTE — Progress Notes (Signed)
Patient ID: Angelica Kelley, female   DOB: 05/16/1945, 68 y.o.   MRN: [id number]    This very nice 68 y.o.[name] presents for 3 month follow up with Hypertension, Hyperlipidemia, Pre-Diabetes and Vitamin D Deficiency.    HTN predates since 1995. BP has been controlled at home. Today's BP: 106/66 mmHg. Patient denies any cardiac type chest pain, palpitations, dyspnea/orthopnea/PND, dizziness, claudication, or dependent edema.   Hyperlipidemia is controlled with diet & meds. Last lipids as below were near goal with LDL borderline elevated. Patient denies myalgias or other med SE's.  Lab Results  Component Value Date   CHOL 175 07/13/2013   HDL 51 07/13/2013   LDLCALC 104* 07/13/2013   TRIG 102 07/13/2013   CHOLHDL 3.4 07/13/2013     Also, the patient has history of PreDiabetes with A1c 5.7% in 2010 and 6.0% in 2012  with last A1c wa s5.7% in May 2015. Patient denies any symptoms of reactive hypoglycemia, diabetic polys, paresthesias or visual blurring.    Further, Patient has history of Vitamin D Deficiency of 13 in 2010 with last vitamin D was 81 in Aug 2014.  Patient supplements vitamin D without any suspected side-effects.    Medication List   ALPRAZolam 1 MG tablet  Commonly known as:  XANAX  Take 1 mg by mouth 3 (three) times daily as needed. For anxiety.     aspirin EC 81 MG tablet  Take 81 mg by mouth daily.     bisoprolol-hydrochlorothiazide 5-6.25 MG per tablet  Commonly known as:  ZIAC  Take 1 tablet by mouth daily.     buPROPion 150 MG 24 hr tablet  Commonly known as:  WELLBUTRIN XL  Take 450 mg by mouth daily.     esomeprazole 40 MG capsule  Commonly known as:  NEXIUM  Take 40 mg by mouth daily at 12 noon.     gabapentin 300 MG capsule  Commonly known as:  NEURONTIN  Take 300 mg by mouth at bedtime.     HEMAX 150-1 MG Tabs  Take 150 mg by mouth daily.     hydrochlorothiazide 12.5 MG capsule  Commonly known as:  MICROZIDE  Take 12.5 mg by mouth daily.     lamoTRIgine 25 MG  tablet  Commonly known as:  LAMICTAL  Take 1 tablet 2 x daily     rosuvastatin 40 MG tablet  Commonly known as:  CRESTOR  Take 20 mg by mouth daily.     sertraline 100 MG tablet  Commonly known as:  ZOLOFT  Take 2 tablets (200 mg total) by mouth daily.     traMADol 50 MG tablet  Commonly known as:  ULTRAM  Take 1 tablet (50 mg total) by mouth 3 (three) times daily as needed for moderate pain.     Vitamin D3 5000 UNITS Caps  Take 5,000 Units by mouth daily.         Allergies  Allergen Reactions  . Lipitor [Atorvastatin]     Fatigue    PMHx:   Past Medical History  Diagnosis Date  . Hypertension   . Hyperlipidemia   . GERD (gastroesophageal reflux disease)   . Anxiety   . Depression   . Alcoholism   . IBS (irritable bowel syndrome)   . Elevated hemoglobin A1c     FHx:    Reviewed / unchanged  SHx:    Reviewed / unchanged   Systems Review: Constitutional: Denies fever, chills, wt changes, headaches, insomnia, fatigue, night sweats, change  in appetite. Eyes: Denies redness, blurred vision, diplopia, discharge, itchy, watery eyes.  ENT: Denies discharge, congestion, post nasal drip, epistaxis, sore throat, earache, hearing loss, dental pain, tinnitus, vertigo, sinus pain, snoring.  CV: Denies chest pain, palpitations, irregular heartbeat, syncope, dyspnea, diaphoresis, orthopnea, PND, claudication or edema. Respiratory: denies cough, dyspnea, DOE, pleurisy, hoarseness, laryngitis, wheezing.  Gastrointestinal: Denies dysphagia, odynophagia, heartburn, reflux, water brash, abdominal pain or cramps, nausea, vomiting, bloating, diarrhea, constipation, hematemesis, melena, hematochezia  or hemorrhoids. Genitourinary: Denies dysuria, frequency, urgency, nocturia, hesitancy, discharge, hematuria or flank pain. Musculoskeletal: Denies arthralgias, myalgias, stiffness, jt. swelling, pain, limping or strain/sprain.  Skin: Denies pruritus, rash, hives, warts, acne, eczema or  change in skin lesion(s). Neuro: No weakness, tremor, incoordination, spasms, paresthesia or pain. Psychiatric: Denies confusion, memory loss or sensory loss. Endo: Denies change in weight, skin or hair change.  Heme/Lymph: No excessive bleeding, bruising or enlarged lymph nodes.   Exam:  BP 106/66  Pulse 64  Temp(Src) 98.1 F (36.7 C) (Temporal)  Resp 16  Ht 5' 5.25" (1.657 m)  Wt 155 lb (70.308 kg)  BMI 25.61 kg/m2  Appears well nourished - in no distress. Eyes: PERRLA, EOMs, conjunctiva no swelling or erythema. Sinuses: No frontal/maxillary tenderness ENT/Mouth: EAC's clear, TM's nl w/o erythema, bulging. Nares clear w/o erythema, swelling, exudates. Oropharynx clear without erythema or exudates. Oral hygiene is good. Tongue normal, non obstructing. Hearing intact.  Neck: Supple. Thyroid nl. Car 2+/2+ without bruits, nodes or JVD. Chest: Respirations nl with BS clear & equal w/o rales, rhonchi, wheezing or stridor.  Cor: Heart sounds normal w/ regular rate and rhythm without sig. murmurs, gallops, clicks, or rubs. Peripheral pulses normal and equal  without edema.  Abdomen: Soft & bowel sounds normal. Non-tender w/o guarding, rebound, hernias, masses, or organomegaly.  Lymphatics: Unremarkable.  Musculoskeletal: Full ROM all peripheral extremities, joint stability, 5/5 strength, and normal gait.  Skin: Warm, dry without exposed rashes, lesions or ecchymosis apparent.  Neuro: Cranial nerves intact, reflexes equal bilaterally. Sensory-motor testing grossly intact. Tendon reflexes grossly intact.  Pysch: Alert & oriented x 3. Insight and judgement nl & appropriate. No ideations.  Assessment and Plan:  1. Hypertension - Continue monitor blood pressure at home. Continue diet/meds same.  2. Hyperlipidemia - Continue diet/meds, exercise,& lifestyle modifications. Continue monitor periodic cholesterol/liver & renal functions   3. Pre-diabetes/Insulin Resistance - Continue diet,  exercise, lifestyle modifications. Monitor appropriate labs.  4. Vitamin D Deficiency - Continue supplementation.  Recommended regular exercise, BP monitoring, weight control, and discussed med and SE's. Recommended labs to assess and monitor clinical status. Further disposition pending results of labs.

## 2013-10-28 LAB — HEPATIC FUNCTION PANEL
ALT: 16 U/L (ref 0–35)
AST: 23 U/L (ref 0–37)
Albumin: 4.5 g/dL (ref 3.5–5.2)
Alkaline Phosphatase: 77 U/L (ref 39–117)
BILIRUBIN INDIRECT: 0.4 mg/dL (ref 0.2–1.2)
Bilirubin, Direct: 0.1 mg/dL (ref 0.0–0.3)
TOTAL PROTEIN: 6.5 g/dL (ref 6.0–8.3)
Total Bilirubin: 0.5 mg/dL (ref 0.2–1.2)

## 2013-10-28 LAB — BASIC METABOLIC PANEL WITH GFR
BUN: 25 mg/dL — AB (ref 6–23)
CO2: 25 mEq/L (ref 19–32)
Calcium: 9.6 mg/dL (ref 8.4–10.5)
Chloride: 102 mEq/L (ref 96–112)
Creat: 1.08 mg/dL (ref 0.50–1.10)
GFR, EST AFRICAN AMERICAN: 60 mL/min
GFR, Est Non African American: 52 mL/min — ABNORMAL LOW
GLUCOSE: 96 mg/dL (ref 70–99)
POTASSIUM: 4.8 meq/L (ref 3.5–5.3)
Sodium: 141 mEq/L (ref 135–145)

## 2013-10-28 LAB — LIPID PANEL
CHOLESTEROL: 167 mg/dL (ref 0–200)
HDL: 46 mg/dL (ref >39)
LDL CALC: 94 mg/dL (ref 0–99)
Total CHOL/HDL Ratio: 3.6 Ratio
Triglycerides: 133 mg/dL (ref <150)
VLDL: 27 mg/dL (ref 0–40)

## 2013-10-28 LAB — HEMOGLOBIN A1C
HEMOGLOBIN A1C: 5.3 % (ref <5.7)
MEAN PLASMA GLUCOSE: 105 mg/dL (ref <117)

## 2013-10-28 LAB — MAGNESIUM: Magnesium: 2 mg/dL (ref 1.5–2.5)

## 2013-10-28 LAB — TSH: TSH: 2.041 u[IU]/mL (ref 0.350–4.500)

## 2013-10-28 LAB — VITAMIN D 25 HYDROXY (VIT D DEFICIENCY, FRACTURES): Vit D, 25-Hydroxy: 87 ng/mL (ref 30–89)

## 2013-10-28 LAB — INSULIN, FASTING: Insulin fasting, serum: 10 u[IU]/mL (ref 3–28)

## 2013-11-02 ENCOUNTER — Telehealth: Payer: Self-pay | Admitting: *Deleted

## 2013-11-02 NOTE — Telephone Encounter (Signed)
INformed patient all labs normal.

## 2013-11-03 ENCOUNTER — Other Ambulatory Visit: Payer: Self-pay | Admitting: Physician Assistant

## 2013-11-03 ENCOUNTER — Other Ambulatory Visit: Payer: Self-pay | Admitting: Internal Medicine

## 2013-11-12 ENCOUNTER — Other Ambulatory Visit: Payer: Self-pay | Admitting: Emergency Medicine

## 2013-12-08 ENCOUNTER — Other Ambulatory Visit: Payer: Self-pay | Admitting: Physician Assistant

## 2013-12-08 MED ORDER — TRAMADOL HCL 50 MG PO TABS: ORAL_TABLET | ORAL | Status: DC

## 2014-01-06 ENCOUNTER — Other Ambulatory Visit: Payer: Self-pay | Admitting: Internal Medicine

## 2014-01-06 DIAGNOSIS — Z1231 Encounter for screening mammogram for malignant neoplasm of breast: Secondary | ICD-10-CM

## 2014-01-28 ENCOUNTER — Ambulatory Visit: Payer: Self-pay | Admitting: Physician Assistant

## 2014-01-28 ENCOUNTER — Encounter: Payer: Self-pay | Admitting: Internal Medicine

## 2014-01-28 ENCOUNTER — Ambulatory Visit (INDEPENDENT_AMBULATORY_CARE_PROVIDER_SITE_OTHER): Payer: Medicare Other | Admitting: Internal Medicine

## 2014-01-28 ENCOUNTER — Ambulatory Visit (HOSPITAL_COMMUNITY)
Admission: RE | Admit: 2014-01-28 | Discharge: 2014-01-28 | Disposition: A | Discharge status: Home / Self Care | Payer: Medicare Other | Source: Ambulatory Visit | Attending: Internal Medicine | Admitting: Internal Medicine

## 2014-01-28 VITALS — BP 110/60 | HR 82 | Temp 98.2°F | Resp 16 | Ht 65.25 in | Wt 149.0 lb

## 2014-01-28 DIAGNOSIS — Z23 Encounter for immunization: Secondary | ICD-10-CM

## 2014-01-28 DIAGNOSIS — Z1231 Encounter for screening mammogram for malignant neoplasm of breast: Secondary | ICD-10-CM | POA: Diagnosis present

## 2014-01-28 DIAGNOSIS — E559 Vitamin D deficiency, unspecified: Secondary | ICD-10-CM

## 2014-01-28 DIAGNOSIS — Z79899 Other long term (current) drug therapy: Secondary | ICD-10-CM

## 2014-01-28 DIAGNOSIS — I10 Essential (primary) hypertension: Secondary | ICD-10-CM

## 2014-01-28 DIAGNOSIS — R7309 Other abnormal glucose: Secondary | ICD-10-CM

## 2014-01-28 DIAGNOSIS — E785 Hyperlipidemia, unspecified: Secondary | ICD-10-CM

## 2014-01-28 LAB — CBC WITH DIFFERENTIAL/PLATELET
BASOS ABS: 0.1 10*3/uL (ref 0.0–0.1)
Basophils Relative: 1 % (ref 0–1)
EOS ABS: 0.1 10*3/uL (ref 0.0–0.7)
EOS PCT: 1 % (ref 0–5)
HCT: 41.9 % (ref 36.0–46.0)
Hemoglobin: 14.2 g/dL (ref 12.0–15.0)
Lymphocytes Relative: 20 % (ref 12–46)
Lymphs Abs: 1.3 10*3/uL (ref 0.7–4.0)
MCH: 30.3 pg (ref 26.0–34.0)
MCHC: 33.9 g/dL (ref 30.0–36.0)
MCV: 89.5 fL (ref 78.0–100.0)
Monocytes Absolute: 0.4 10*3/uL (ref 0.1–1.0)
Monocytes Relative: 6 % (ref 3–12)
Neutro Abs: 4.5 10*3/uL (ref 1.7–7.7)
Neutrophils Relative %: 72 % (ref 43–77)
Platelets: 205 10*3/uL (ref 150–400)
RBC: 4.68 MIL/uL (ref 3.87–5.11)
RDW: 12.7 % (ref 11.5–15.5)
WBC: 6.3 10*3/uL (ref 4.0–10.5)

## 2014-01-28 NOTE — Patient Instructions (Signed)

## 2014-01-28 NOTE — Progress Notes (Signed)
Patient ID: Angelica Kelley, female   DOB: [birth year], 68 y.o.   MRN: [id number]   This very nice 68 y.o.[name] presents for 3 month follow up with Hypertension, Hyperlipidemia, Depression, Pre-Diabetes and Vitamin D Deficiency.    Patient is treated for HTN & BP has been controlled at home. Today's BP: 110/60 mmHg. Patient denies any cardiac type chest pain, palpitations, dyspnea/orthopnea/PND, dizziness, claudication, or dependent edema.   Hyperlipidemia is controlled with diet & meds. Patient denies myalgias or other med SE's. Last Lipids were  Total Chol 167; HDL  46; LDL 94; Trig 133 on 10/27/2013.   Also, the patient has history of PreDiabetes and patient denies any symptoms of reactive hypoglycemia, diabetic polys, paresthesias or visual blurring.  Last A1c was  5.3% on 10/27/2013.    Further, Patient has history of Vitamin D Deficiency and patient supplements vitamin D without any suspected side-effects. Last vitamin D was  87 on 10/27/2013.    Medication List   ALPRAZolam 1 MG tablet  Commonly known as:  XANAX  Take 1 mg by mouth 3 (three) times daily as needed. For anxiety.     aspirin EC 81 MG tablet  Take 81 mg by mouth daily.     bisoprolol-hydrochlorothiazide 5-6.25 MG per tablet  Commonly known as:  ZIAC  Take 1 tablet by mouth daily.     buPROPion 150 MG 24 hr tablet  Commonly known as:  WELLBUTRIN XL  Take 450 mg by mouth daily.     CRESTOR 40 MG tablet  Generic drug:  rosuvastatin  take 1/2 to 1 tablet by mouth once daily as directed for cholesterol     esomeprazole 40 MG capsule  Commonly known as:  NEXIUM  Take 40 mg by mouth daily at 12 noon.     gabapentin 300 MG capsule  Commonly known as:  NEURONTIN  Take 300 mg by mouth at bedtime.     HEMAX 150-1 MG Tabs  Take 150 mg by mouth daily.     hydrochlorothiazide 12.5 MG capsule  Commonly known as:  MICROZIDE  Take 12.5 mg by mouth daily.     lamoTRIgine 25 MG tablet  Commonly known as:  LAMICTAL  Take 1  tablet 2 x daily     sertraline 100 MG tablet  Commonly known as:  ZOLOFT  Take 2 tablets (200 mg total) by mouth daily.     traMADol 50 MG tablet  Commonly known as:  ULTRAM  take 1 tablet by mouth three times a day if needed for MODERATE PAIN     Vitamin D3 5000 UNITS Caps  Take 5,000 Units by mouth daily.     Allergies  Allergen Reactions  . Lipitor [Atorvastatin]     Fatigue   PMHx:   Past Medical History  Diagnosis Date  . Hypertension   . Hyperlipidemia   . GERD (gastroesophageal reflux disease)   . Anxiety   . Depression   . Alcoholism   . IBS (irritable bowel syndrome)   . Elevated hemoglobin A1c    FHx:    Reviewed / unchanged SHx:    Reviewed / unchanged  Systems Review:  Constitutional: Denies fever, chills, wt changes, headaches, insomnia, fatigue, night sweats, change in appetite. Eyes: Denies redness, blurred vision, diplopia, discharge, itchy, watery eyes.  ENT: Denies discharge, congestion, post nasal drip, epistaxis, sore throat, earache, hearing loss, dental pain, tinnitus, vertigo, sinus pain, snoring.  CV: Denies chest pain, palpitations, irregular heartbeat, syncope, dyspnea, diaphoresis, orthopnea,  PND, claudication or edema. Respiratory: denies cough, dyspnea, DOE, pleurisy, hoarseness, laryngitis, wheezing.  Gastrointestinal: Denies dysphagia, odynophagia, heartburn, reflux, water brash, abdominal pain or cramps, nausea, vomiting, bloating, diarrhea, constipation, hematemesis, melena, hematochezia  or hemorrhoids. Genitourinary: Denies dysuria, frequency, urgency, nocturia, hesitancy, discharge, hematuria or flank pain. Musculoskeletal: Denies arthralgias, myalgias, stiffness, jt. swelling, pain, limping or strain/sprain.  Skin: Denies pruritus, rash, hives, warts, acne, eczema or change in skin lesion(s). Neuro: No weakness, tremor, incoordination, spasms, paresthesia or pain. Psychiatric: Denies confusion, memory loss or sensory loss. Endo:  Denies change in weight, skin or hair change.  Heme/Lymph: No excessive bleeding, bruising or enlarged lymph nodes.  Exam:  BP 110/60  Pulse 82  Temp(Src) 98.2 F (36.8 C) (Temporal)  Resp 16  Ht 5' 5.25" (1.657 m)  Wt 149 lb (67.586 kg)  BMI 24.62 kg/m2  Appears well nourished and in no distress. Eyes: PERRLA, EOMs, conjunctiva no swelling or erythema. Sinuses: No frontal/maxillary tenderness ENT/Mouth: EAC's clear, TM's nl w/o erythema, bulging. Nares clear w/o erythema, swelling, exudates. Oropharynx clear without erythema or exudates. Oral hygiene is good. Tongue normal, non obstructing. Hearing intact.  Neck: Supple. Thyroid nl. Car 2+/2+ without bruits, nodes or JVD. Chest: Respirations nl with BS clear & equal w/o rales, rhonchi, wheezing or stridor.  Cor: Heart sounds normal w/ regular rate and rhythm without sig. murmurs, gallops, clicks, or rubs. Peripheral pulses normal and equal  without edema.  Abdomen: Soft & bowel sounds normal. Non-tender w/o guarding, rebound, hernias, masses, or organomegaly.  Lymphatics: Unremarkable.  Musculoskeletal: Full ROM all peripheral extremities, joint stability, 5/5 strength, and normal gait.  Skin: Warm, dry without exposed rashes, lesions or ecchymosis apparent.  Neuro: Cranial nerves intact, reflexes equal bilaterally. Sensory-motor testing grossly intact. Tendon reflexes grossly intact.  Pysch: Alert & oriented x 3.  Insight and judgement nl & appropriate. No ideations.  Assessment and Plan:  1. Hypertension - Continue monitor blood pressure at home. Continue diet/meds same.  2. Hyperlipidemia - Continue diet/meds, exercise,& lifestyle modifications. Continue monitor periodic cholesterol/liver & renal functions   3. Pre-Diabetes - Continue diet, exercise, lifestyle modifications. Monitor appropriate labs.  4. Vitamin D Deficiency - Continue supplementation.  Recommended regular exercise, BP monitoring, weight control, and  discussed med and SE's. Recommended labs to assess and monitor clinical status. Further disposition pending results of labs.

## 2014-01-29 LAB — BASIC METABOLIC PANEL WITH GFR
BUN: 22 mg/dL (ref 6–23)
CO2: 27 mEq/L (ref 19–32)
CREATININE: 1.04 mg/dL (ref 0.50–1.10)
Calcium: 9.6 mg/dL (ref 8.4–10.5)
Chloride: 101 mEq/L (ref 96–112)
GFR, EST NON AFRICAN AMERICAN: 55 mL/min — AB
GFR, Est African American: 63 mL/min
Glucose, Bld: 92 mg/dL (ref 70–99)
Potassium: 4.2 mEq/L (ref 3.5–5.3)
Sodium: 141 mEq/L (ref 135–145)

## 2014-01-29 LAB — HEPATIC FUNCTION PANEL
ALT: 15 U/L (ref 0–35)
AST: 16 U/L (ref 0–37)
Albumin: 4.6 g/dL (ref 3.5–5.2)
Alkaline Phosphatase: 49 U/L (ref 39–117)
BILIRUBIN TOTAL: 0.5 mg/dL (ref 0.2–1.2)
Bilirubin, Direct: 0.1 mg/dL (ref 0.0–0.3)
Indirect Bilirubin: 0.4 mg/dL (ref 0.2–1.2)
TOTAL PROTEIN: 6.7 g/dL (ref 6.0–8.3)

## 2014-01-29 LAB — INSULIN, FASTING: INSULIN FASTING, SERUM: 2.1 u[IU]/mL (ref 2.0–19.6)

## 2014-01-29 LAB — LIPID PANEL
Cholesterol: 180 mg/dL (ref 0–200)
HDL: 47 mg/dL (ref >39)
LDL Cholesterol: 107 mg/dL — ABNORMAL HIGH (ref 0–99)
TRIGLYCERIDES: 131 mg/dL (ref <150)
Total CHOL/HDL Ratio: 3.8 Ratio
VLDL: 26 mg/dL (ref 0–40)

## 2014-01-29 LAB — TSH: TSH: 1.922 u[IU]/mL (ref 0.350–4.500)

## 2014-01-29 LAB — HEMOGLOBIN A1C
Hgb A1c MFr Bld: 5.9 % — ABNORMAL HIGH (ref <5.7)
MEAN PLASMA GLUCOSE: 123 mg/dL — AB (ref <117)

## 2014-01-29 LAB — VITAMIN D 25 HYDROXY (VIT D DEFICIENCY, FRACTURES): VIT D 25 HYDROXY: 109 ng/mL — AB (ref 30–89)

## 2014-01-29 LAB — MAGNESIUM: MAGNESIUM: 2.1 mg/dL (ref 1.5–2.5)

## 2014-03-10 ENCOUNTER — Other Ambulatory Visit: Payer: Self-pay | Admitting: Physician Assistant

## 2014-03-10 ENCOUNTER — Other Ambulatory Visit: Payer: Self-pay | Admitting: Internal Medicine

## 2014-03-10 MED ORDER — TRAMADOL HCL 50 MG PO TABS: ORAL_TABLET | ORAL | Status: DC

## 2014-03-18 ENCOUNTER — Other Ambulatory Visit: Payer: Self-pay | Admitting: Internal Medicine

## 2014-03-21 ENCOUNTER — Other Ambulatory Visit: Payer: Self-pay | Admitting: Physician Assistant

## 2014-03-22 ENCOUNTER — Other Ambulatory Visit: Payer: Self-pay | Admitting: Physician Assistant

## 2014-04-05 ENCOUNTER — Encounter: Payer: Self-pay | Admitting: Internal Medicine

## 2014-04-15 ENCOUNTER — Other Ambulatory Visit: Payer: Self-pay | Admitting: Physician Assistant

## 2014-04-29 ENCOUNTER — Ambulatory Visit (INDEPENDENT_AMBULATORY_CARE_PROVIDER_SITE_OTHER): Payer: Medicare Other | Admitting: Internal Medicine

## 2014-04-29 ENCOUNTER — Encounter: Payer: Self-pay | Admitting: Internal Medicine

## 2014-04-29 VITALS — BP 110/68 | HR 60 | Temp 97.9°F | Resp 16 | Ht 65.0 in | Wt 155.8 lb

## 2014-04-29 DIAGNOSIS — E785 Hyperlipidemia, unspecified: Secondary | ICD-10-CM

## 2014-04-29 DIAGNOSIS — Z1212 Encounter for screening for malignant neoplasm of rectum: Secondary | ICD-10-CM

## 2014-04-29 DIAGNOSIS — F329 Major depressive disorder, single episode, unspecified: Secondary | ICD-10-CM

## 2014-04-29 DIAGNOSIS — Z9181 History of falling: Secondary | ICD-10-CM

## 2014-04-29 DIAGNOSIS — Z0001 Encounter for general adult medical examination with abnormal findings: Secondary | ICD-10-CM

## 2014-04-29 DIAGNOSIS — Z79899 Other long term (current) drug therapy: Secondary | ICD-10-CM

## 2014-04-29 DIAGNOSIS — F32A Depression, unspecified: Secondary | ICD-10-CM

## 2014-04-29 DIAGNOSIS — I10 Essential (primary) hypertension: Secondary | ICD-10-CM

## 2014-04-29 DIAGNOSIS — E559 Vitamin D deficiency, unspecified: Secondary | ICD-10-CM

## 2014-04-29 DIAGNOSIS — R7309 Other abnormal glucose: Secondary | ICD-10-CM

## 2014-04-29 LAB — CBC WITH DIFFERENTIAL/PLATELET
BASOS PCT: 0 % (ref 0–1)
Basophils Absolute: 0 10*3/uL (ref 0.0–0.1)
EOS ABS: 0.1 10*3/uL (ref 0.0–0.7)
Eosinophils Relative: 1 % (ref 0–5)
HEMATOCRIT: 38.7 % (ref 36.0–46.0)
Hemoglobin: 13.2 g/dL (ref 12.0–15.0)
Lymphocytes Relative: 17 % (ref 12–46)
Lymphs Abs: 1.3 10*3/uL (ref 0.7–4.0)
MCH: 29.6 pg (ref 26.0–34.0)
MCHC: 34.1 g/dL (ref 30.0–36.0)
MCV: 86.8 fL (ref 78.0–100.0)
MONO ABS: 0.5 10*3/uL (ref 0.1–1.0)
MONOS PCT: 7 % (ref 3–12)
MPV: 11.2 fL (ref 9.4–12.4)
NEUTROS PCT: 75 % (ref 43–77)
Neutro Abs: 5.7 10*3/uL (ref 1.7–7.7)
Platelets: 195 10*3/uL (ref 150–400)
RBC: 4.46 MIL/uL (ref 3.87–5.11)
RDW: 12.4 % (ref 11.5–15.5)
WBC: 7.6 10*3/uL (ref 4.0–10.5)

## 2014-04-29 NOTE — Patient Instructions (Signed)
Recommend the book "The END of DIETING" by Dr Baker Janus   and the book "The END of DIABETES " by Dr Excell Seltzer  At [email or web address] - get book & Audio CD's      Being diabetic has a  300% increased risk for heart attack, stroke, cancer, and alzheimer- type vascular dementia. It is very important that you work harder with diet by avoiding all foods that are white except chicken & fish. Avoid white rice (brown & wild rice is OK), white potatoes (sweetpotatoes in moderation is OK), White bread or wheat bread or anything made out of white flour like bagels, donuts, rolls, buns, biscuits, cakes, pastries, cookies, pizza crust, and pasta (made from white flour & egg whites) - vegetarian pasta or spinach or wheat pasta is OK. Multigrain breads like Arnold's or [employer], or multigrain sandwich thins or flatbreads.  Diet, exercise and weight loss can reverse and cure diabetes in the early stages.  Diet, exercise and weight loss is very important in the control and prevention of complications of diabetes which affects every system in your body, ie. Brain - dementia/stroke, eyes - glaucoma/blindness, heart - heart attack/heart failure, kidneys - dialysis, stomach - gastric paralysis, intestines - malabsorption, nerves - severe painful neuritis, circulation - gangrene & loss of a leg(s), and finally cancer and Alzheimers.    I recommend avoid fried & greasy foods,  sweets/candy, white rice (brown or wild rice or Quinoa is OK), white potatoes (sweet potatoes are OK) - anything made from white flour - bagels, doughnuts, rolls, buns, biscuits,white and wheat breads, pizza crust and traditional pasta made of white flour & egg white(vegetarian pasta or spinach or wheat pasta is OK).  Multi-grain bread is OK - like multi-grain flat bread or sandwich thins. Avoid alcohol in excess. Exercise is also important.    Eat all the vegetables you want - avoid meat, especially red meat and dairy - especially cheese.  Cheese  is the most concentrated form of trans-fats which is the worst thing to clog up our arteries. Veggie cheese is OK which can be found in the fresh produce section at [employer] or [employer] or [employer]  Preventive Care for Adults A healthy lifestyle and preventive care can promote health and wellness. Preventive health guidelines for women include the following key practices.  A routine yearly physical is a good way to check with your health care provider about your health and preventive screening. It is a chance to share any concerns and updates on your health and to receive a thorough exam.  Visit your dentist for a routine exam and preventive care every 6 months. Brush your teeth twice a day and floss once a day. Good oral hygiene prevents tooth decay and gum disease.  The frequency of eye exams is based on your age, health, family medical history, use of contact lenses, and other factors. Follow your health care provider's recommendations for frequency of eye exams.  Eat a healthy diet. Foods like vegetables, fruits, whole grains, low-fat dairy products, and lean protein foods contain the nutrients you need without too many calories. Decrease your intake of foods high in solid fats, added sugars, and salt. Eat the right amount of calories for you.Get information about a proper diet from your health care provider, if necessary.  Regular physical exercise is one of the most important things you can do for your health. Most adults should get at least 150 minutes of moderate-intensity exercise (any activity that increases  your heart rate and causes you to sweat) each week. In addition, most adults need muscle-strengthening exercises on 2 or more days a week.  Maintain a healthy weight. The body mass index (BMI) is a screening tool to identify possible weight problems. It provides an estimate of body fat based on height and weight. Your health care provider can find your BMI and can help you  achieve or maintain a healthy weight.For adults 20 years and older:  A BMI below 18.5 is considered underweight.  A BMI of 18.5 to 24.9 is normal.  A BMI of 25 to 29.9 is considered overweight.  A BMI of 30 and above is considered obese.  Maintain normal blood lipids and cholesterol levels by exercising and minimizing your intake of saturated fat. Eat a balanced diet with plenty of fruit and vegetables. Blood tests for lipids and cholesterol should begin at age 38 and be repeated every 5 years. If your lipid or cholesterol levels are high, you are over 50, or you are at high risk for heart disease, you may need your cholesterol levels checked more frequently.Ongoing high lipid and cholesterol levels should be treated with medicines if diet and exercise are not working.  If you smoke, find out from your health care provider how to quit. If you do not use tobacco, do not start.  Lung cancer screening is recommended for adults aged 64-80 years who are at high risk for developing lung cancer because of a history of smoking. A yearly low-dose CT scan of the lungs is recommended for people who have at least a 30-pack-year history of smoking and are a current smoker or have quit within the past 15 years. A pack year of smoking is smoking an average of 1 pack of cigarettes a day for 1 year (for example: 1 pack a day for 30 years or 2 packs a day for 15 years). Yearly screening should continue until the smoker has stopped smoking for at least 15 years. Yearly screening should be stopped for people who develop a health problem that would prevent them from having lung cancer treatment.  High blood pressure causes heart disease and increases the risk of stroke. Your blood pressure should be checked at least every 1 to 2 years. Ongoing high blood pressure should be treated with medicines if weight loss and exercise do not work.  If you are 79-57 years old, ask your health care provider if you should take  aspirin to prevent strokes.  Diabetes screening involves taking a blood sample to check your fasting blood sugar level. This should be done once every 3 years, after age 37, if you are within normal weight and without risk factors for diabetes. Testing should be considered at a younger age or be carried out more frequently if you are overweight and have at least 1 risk factor for diabetes.  Breast cancer screening is essential preventive care for women. You should practice "breast self-awareness." This means understanding the normal appearance and feel of your breasts and may include breast self-examination. Any changes detected, no matter how small, should be reported to a health care provider. Women in their 76s and 30s should have a clinical breast exam (CBE) by a health care provider as part of a regular health exam every 1 to 3 years. After age 55, women should have a CBE every year. Starting at age 79, women should consider having a mammogram (breast X-ray test) every year. Women who have a family history of breast  cancer should talk to their health care provider about genetic screening. Women at a high risk of breast cancer should talk to their health care providers about having an MRI and a mammogram every year.  Breast cancer gene (BRCA)-related cancer risk assessment is recommended for women who have family members with BRCA-related cancers. BRCA-related cancers include breast, ovarian, tubal, and peritoneal cancers. Having family members with these cancers may be associated with an increased risk for harmful changes (mutations) in the breast cancer genes BRCA1 and BRCA2. Results of the assessment will determine the need for genetic counseling and BRCA1 and BRCA2 testing.  Routine pelvic exams to screen for cancer are no longer recommended for nonpregnant women who are considered low risk for cancer of the pelvic organs (ovaries, uterus, and vagina) and who do not have symptoms. Ask your health  care provider if a screening pelvic exam is right for you.  If you have had past treatment for cervical cancer or a condition that could lead to cancer, you need Pap tests and screening for cancer for at least 20 years after your treatment. If Pap tests have been discontinued, your risk factors (such as having a new sexual partner) need to be reassessed to determine if screening should be resumed. Some women have medical problems that increase the chance of getting cervical cancer. In these cases, your health care provider may recommend more frequent screening and Pap tests.  Colorectal cancer can be detected and often prevented. Most routine colorectal cancer screening begins at the age of 58 years and continues through age 44 years. However, your health care provider may recommend screening at an earlier age if you have risk factors for colon cancer. On a yearly basis, your health care provider may provide home test kits to check for hidden blood in the stool. Use of a small camera at the end of a tube, to directly examine the colon (sigmoidoscopy or colonoscopy), can detect the earliest forms of colorectal cancer. Talk to your health care provider about this at age 84, when routine screening begins. Direct exam of the colon should be repeated every 5-10 years through age 33 years, unless early forms of pre-cancerous polyps or small growths are found.  Hepatitis C blood testing is recommended for all people born from [birth year] through 1965 and any individual with known risks for hepatitis C.  Pra  Osteoporosis is a disease in which the bones lose minerals and strength with aging. This can result in serious bone fractures or breaks. The risk of osteoporosis can be identified using a bone density scan. Women ages 36 years and over and women at risk for fractures or osteoporosis should discuss screening with their health care providers. Ask your health care provider whether you should take a calcium supplement  or vitamin D to reduce the rate of osteoporosis.  Menopause can be associated with physical symptoms and risks. Hormone replacement therapy is available to decrease symptoms and risks. You should talk to your health care provider about whether hormone replacement therapy is right for you.  Use sunscreen. Apply sunscreen liberally and repeatedly throughout the day. You should seek shade when your shadow is shorter than you. Protect yourself by wearing long sleeves, pants, a wide-brimmed hat, and sunglasses year round, whenever you are outdoors.  Once a month, do a whole body skin exam, using a mirror to look at the skin on your back. Tell your health care provider of new moles, moles that have irregular borders, moles that are  larger than a pencil eraser, or moles that have changed in shape or color.  Stay current with required vaccines (immunizations).  Influenza vaccine. All adults should be immunized every year.  Tetanus, diphtheria, and acellular pertussis (Td, Tdap) vaccine. Pregnant women should receive 1 dose of Tdap vaccine during each pregnancy. The dose should be obtained regardless of the length of time since the last dose. Immunization is preferred during the 27th-36th week of gestation. An adult who has not previously received Tdap or who does not know her vaccine status should receive 1 dose of Tdap. This initial dose should be followed by tetanus and diphtheria toxoids (Td) booster doses every 10 years. Adults with an unknown or incomplete history of completing a 3-dose immunization series with Td-containing vaccines should begin or complete a primary immunization series including a Tdap dose. Adults should receive a Td booster every 10 years.  Varicella vaccine. An adult without evidence of immunity to varicella should receive 2 doses or a second dose if she has previously received 1 dose. Pregnant females who do not have evidence of immunity should receive the first dose after  pregnancy. This first dose should be obtained before leaving the health care facility. The second dose should be obtained 4-8 weeks after the first dose.  Human papillomavirus (HPV) vaccine. Females aged 13-26 years who have not received the vaccine previously should obtain the 3-dose series. The vaccine is not recommended for use in pregnant females. However, pregnancy testing is not needed before receiving a dose. If a female is found to be pregnant after receiving a dose, no treatment is needed. In that case, the remaining doses should be delayed until after the pregnancy. Immunization is recommended for any person with an immunocompromised condition through the age of 26 years if she did not get any or all doses earlier. During the 3-dose series, the second dose should be obtained 4-8 weeks after the first dose. The third dose should be obtained 24 weeks after the first dose and 16 weeks after the second dose.  Zoster vaccine. One dose is recommended for adults aged 60 years or older unless certain conditions are present.  Measles, mumps, and rubella (MMR) vaccine. Adults born before 1957 generally are considered immune to measles and mumps. Adults born in 1957 or later should have 1 or more doses of MMR vaccine unless there is a contraindication to the vaccine or there is laboratory evidence of immunity to each of the three diseases. A routine second dose of MMR vaccine should be obtained at least 28 days after the first dose for students attending postsecondary schools, health care workers, or international travelers. People who received inactivated measles vaccine or an unknown type of measles vaccine during 1963-1967 should receive 2 doses of MMR vaccine. People who received inactivated mumps vaccine or an unknown type of mumps vaccine before 1979 and are at high risk for mumps infection should consider immunization with 2 doses of MMR vaccine. For females of childbearing age, rubella immunity should  be determined. If there is no evidence of immunity, females who are not pregnant should be vaccinated. If there is no evidence of immunity, females who are pregnant should delay immunization until after pregnancy. Unvaccinated health care workers born before 1957 who lack laboratory evidence of measles, mumps, or rubella immunity or laboratory confirmation of disease should consider measles and mumps immunization with 2 doses of MMR vaccine or rubella immunization with 1 dose of MMR vaccine.  Pneumococcal 13-valent conjugate (PCV13) vaccine. When   indicated, a person who is uncertain of her immunization history and has no record of immunization should receive the PCV13 vaccine. An adult aged 73 years or older who has certain medical conditions and has not been previously immunized should receive 1 dose of PCV13 vaccine. This PCV13 should be followed with a dose of pneumococcal polysaccharide (PPSV23) vaccine. The PPSV23 vaccine dose should be obtained at least 8 weeks after the dose of PCV13 vaccine. An adult aged 81 years or older who has certain medical conditions and previously received 1 or more doses of PPSV23 vaccine should receive 1 dose of PCV13. The PCV13 vaccine dose should be obtained 1 or more years after the last PPSV23 vaccine dose.    Pneumococcal polysaccharide (PPSV23) vaccine. When PCV13 is also indicated, PCV13 should be obtained first. All adults aged 69 years and older should be immunized. An adult younger than age 35 years who has certain medical conditions should be immunized. Any person who resides in a nursing home or long-term care facility should be immunized. An adult smoker should be immunized. People with an immunocompromised condition and certain other conditions should receive both PCV13 and PPSV23 vaccines. People with human immunodeficiency virus (HIV) infection should be immunized as soon as possible after diagnosis. Immunization during chemotherapy or radiation therapy should  be avoided. Routine use of PPSV23 vaccine is not recommended for American Indians, [place]atives, or people younger than 65 years unless there are medical conditions that require PPSV23 vaccine. When indicated, people who have unknown immunization and have no record of immunization should receive PPSV23 vaccine. One-time revaccination 5 years after the first dose of PPSV23 is recommended for people aged 19-64 years who have chronic kidney failure, nephrotic syndrome, asplenia, or immunocompromised conditions. People who received 1-2 doses of PPSV23 before age 79 years should receive another dose of PPSV23 vaccine at age 73 years or later if at least 5 years have passed since the previous dose. Doses of PPSV23 are not needed for people immunized with PPSV23 at or after age 19 years.  Preventive Services / Frequency   Ages 25 to 65 years  Blood pressure check.  Lipid and cholesterol check.  Lung cancer screening. / Every year if you are aged 8-80 years and have a 30-pack-year history of smoking and currently smoke or have quit within the past 15 years. Yearly screening is stopped once you have quit smoking for at least 15 years or develop a health problem that would prevent you from having lung cancer treatment.  Clinical breast exam.** / Every year after age 28 years.  BRCA-related cancer risk assessment.** / For women who have family members with a BRCA-related cancer (breast, ovarian, tubal, or peritoneal cancers).  Mammogram.** / Every year beginning at age 57 years and continuing for as long as you are in good health. Consult with your health care provider.  Pap test.** / Every 3 years starting at age 57 years through age 25 or 13 years with a history of 3 consecutive normal Pap tests.  HPV screening.** / Every 3 years from ages 21 years through ages 60 to 48 years with a history of 3 consecutive normal Pap tests.  Fecal occult blood test (FOBT) of stool. / Every year beginning at age 77  years and continuing until age 57 years. You may not need to do this test if you get a colonoscopy every 10 years.  Flexible sigmoidoscopy or colonoscopy.** / Every 5 years for a flexible sigmoidoscopy or every 10 years  for a colonoscopy beginning at age 104 years and continuing until age 39 years.  Hepatitis C blood test.** / For all people born from [birth year] through 1967 and any individual with known risks for hepatitis C.  Skin self-exam. / Monthly.  Influenza vaccine. / Every year.  Tetanus, diphtheria, and acellular pertussis (Tdap/Td) vaccine.** / Consult your health care provider. Pregnant women should receive 1 dose of Tdap vaccine during each pregnancy. 1 dose of Td every 10 years.  Varicella vaccine.** / Consult your health care provider. Pregnant females who do not have evidence of immunity should receive the first dose after pregnancy.  Zoster vaccine.** / 1 dose for adults aged 47 years or older.  Pneumococcal 13-valent conjugate (PCV13) vaccine.** / Consult your health care provider.  Pneumococcal polysaccharide (PPSV23) vaccine.** / 1 to 2 doses if you smoke cigarettes or if you have certain conditions.  Meningococcal vaccine.** / Consult your health care provider.  Hepatitis A vaccine.** / Consult your health care provider.  Hepatitis B vaccine.** / Consult your health care provider. Screening for abdominal aortic aneurysm (AAA)  by ultrasound is recommended for people over 50 who have history of high blood pressure or who are current or former smokers.

## 2014-04-30 LAB — BASIC METABOLIC PANEL WITH GFR
BUN: 20 mg/dL (ref 6–23)
CALCIUM: 9.7 mg/dL (ref 8.4–10.5)
CO2: 25 mEq/L (ref 19–32)
Chloride: 99 mEq/L (ref 96–112)
Creat: 0.99 mg/dL (ref 0.50–1.10)
GFR, EST AFRICAN AMERICAN: 67 mL/min
GFR, Est Non African American: 58 mL/min — ABNORMAL LOW
Glucose, Bld: 80 mg/dL (ref 70–99)
Potassium: 3.8 mEq/L (ref 3.5–5.3)
SODIUM: 137 meq/L (ref 135–145)

## 2014-04-30 LAB — LIPID PANEL
CHOLESTEROL: 166 mg/dL (ref 0–200)
HDL: 59 mg/dL (ref >39)
LDL Cholesterol: 89 mg/dL (ref 0–99)
Total CHOL/HDL Ratio: 2.8 Ratio
Triglycerides: 88 mg/dL (ref <150)
VLDL: 18 mg/dL (ref 0–40)

## 2014-04-30 LAB — URINALYSIS, MICROSCOPIC ONLY
BACTERIA UA: NONE SEEN
CASTS: NONE SEEN
Crystals: NONE SEEN

## 2014-04-30 LAB — HEPATIC FUNCTION PANEL
ALBUMIN: 4.6 g/dL (ref 3.5–5.2)
ALT: 13 U/L (ref 0–35)
AST: 19 U/L (ref 0–37)
Alkaline Phosphatase: 57 U/L (ref 39–117)
Bilirubin, Direct: 0.1 mg/dL (ref 0.0–0.3)
Indirect Bilirubin: 0.4 mg/dL (ref 0.2–1.2)
TOTAL PROTEIN: 6.8 g/dL (ref 6.0–8.3)
Total Bilirubin: 0.5 mg/dL (ref 0.2–1.2)

## 2014-04-30 LAB — MICROALBUMIN / CREATININE URINE RATIO
Creatinine, Urine: 108.5 mg/dL
Microalb Creat Ratio: 4.6 mg/g (ref 0.0–30.0)
Microalb, Ur: 0.5 mg/dL (ref <2.0)

## 2014-04-30 LAB — HEMOGLOBIN A1C
HEMOGLOBIN A1C: 5.6 % (ref <5.7)
MEAN PLASMA GLUCOSE: 114 mg/dL (ref <117)

## 2014-04-30 LAB — TSH: TSH: 1.932 u[IU]/mL (ref 0.350–4.500)

## 2014-04-30 LAB — MAGNESIUM: MAGNESIUM: 2.1 mg/dL (ref 1.5–2.5)

## 2014-04-30 LAB — VITAMIN D 25 HYDROXY (VIT D DEFICIENCY, FRACTURES): VIT D 25 HYDROXY: 69 ng/mL (ref 30–100)

## 2014-04-30 LAB — INSULIN, FASTING: Insulin fasting, serum: 2.1 u[IU]/mL (ref 2.0–19.6)

## 2014-05-02 ENCOUNTER — Encounter: Payer: Self-pay | Admitting: Internal Medicine

## 2014-05-02 NOTE — Progress Notes (Signed)
Patient ID: Angelica Kelley, female   DOB: Apr 17, 1946, 68 y.o.   MRN: [id number]  Annual Screening/Preventative Comprehensive Examination  This very nice 68 y.o.[name] presents for complete physical.  Patient has been followed for HTN, Depression, Prediabetes, Hyperlipidemia, and Vitamin D Deficiency.    HTN predates since 1995. Patient's BP has been controlled at home and patient denies any cardiac symptoms as chest pain, palpitations, shortness of breath, dizziness or ankle swelling. Today's BP was 110/68 mmHg    Patient's hyperlipidemia is controlled with diet and medications. Patient denies myalgias or other medication SE's. Last lipids were at goal -  Total Chol 166; HDL  59; LDL 89; Trig 88 on 04/29/2014.   Patient has  prediabetes predating since Dec 2012 with A1c 5.7% and 6.1% in May 2013 and patient denies reactive hypoglycemic symptoms, visual blurring, diabetic polys, or paresthesias. Last A1c was 5.6% on 04/29/2014.   Finally, patient has history of Vitamin D Deficiency of 13 in 2010 and last Vitamin D was  69 on 04/29/2014.  Medication Sig  . ALPRAZolam (XANAX) 1 MG tablet Take 1 mg by mouth 3 (three) times daily as needed. For anxiety.  Marland Kitchen aspirin EC 81 MG tablet Take 81 mg by mouth daily.  . bisoprolol-hctz 5-6.25 MG per tablet Take 1 tablet by mouth daily.  Marland Kitchen buPROPion  XL 150 MG 24 hr tablet Take 450 mg by mouth daily.  Marland Kitchen VITAMIN D 5000 UNITS CAPS Take 5,000 Units by mouth daily.   . CRESTOR 40 MG tablet take 1/2 to 1 tablet by mouth once daily as directed for cholesterol  . Esomeprazole 40 MG  Take 40 mg by mouth daily at 12 noon.  . gabapentin 300 MG cap take 1 to 2 capsules by mouth at bedtime  . Hydrochlorothiazide 12.5 MG cap take 1 capsule by mouth once daily for BLOOD PRESSURE / FLUID.  . Iron-DSS-B12-FA-C-E-Cu-Biotin (HEMAX) 150-1 MG TABS Take 150 mg by mouth daily.  Marland Kitchen lamoTRIgine (LAMICTAL) 25 MG tablet Take 1 tablet 2 x daily  . traMADol (ULTRAM) 50 MG tablet Take 1 tablet  up to 3 x day ONLY if needed for severe pain   Allergies  Allergen Reactions  . Lipitor [Atorvastatin]     Fatigue   Past Medical History  Diagnosis Date  . Hypertension   . Hyperlipidemia   . GERD (gastroesophageal reflux disease)   . Anxiety   . Depression   . Alcoholism   . IBS (irritable bowel syndrome)   . Elevated hemoglobin A1c    Health Maintenance  Topic Date Due  . COLONOSCOPY  07/20/1993  . INFLUENZA VACCINE  12/13/2014  . TETANUS/TDAP  05/15/2015  . MAMMOGRAM  01/29/2016  . PNEUMOCOCCAL POLYSACCHARIDE VACCINE AGE 5 AND OVER  Completed  . ZOSTAVAX  Completed   Immunization History  Administered Date(s) Administered  . Influenza, High Dose Seasonal PF 01/28/2014  . Influenza-Unspecified 03/15/2011  . Pneumococcal-Unspecified 05/14/2008  . Td 05/14/2005  . Zoster 01/23/2012   Past Surgical History  Procedure Laterality Date  . Neck surgery  Remote     Ruptured disc, per patient. Got infected.   . Abdominal hysterectomy  1991  . Breast surgery Left 1989    [facility]  . Tonsillectomy  1965  . Cervical laminectomy  1977   Family History  Problem Relation Age of Onset  . Heart disease Mother   . Diabetes Mother   . Leukemia Father   . Heart disease Brother   . Cancer Brother   .  Parkinson's disease Brother    History  Substance Use Topics  . Smoking status: Never Smoker   . Smokeless tobacco: Not on file  . Alcohol Use: No    ROS Constitutional: Denies fever, chills, weight loss/gain, headaches, insomnia, fatigue, night sweats, and change in appetite. Eyes: Denies redness, blurred vision, diplopia, discharge, itchy, watery eyes.  ENT: Denies discharge, congestion, post nasal drip, epistaxis, sore throat, earache, hearing loss, dental pain, Tinnitus, Vertigo, Sinus pain, snoring.  Cardio: Denies chest pain, palpitations, irregular heartbeat, syncope, dyspnea, diaphoresis, orthopnea, PND, claudication, edema Respiratory: denies cough, dyspnea, DOE,  pleurisy, hoarseness, laryngitis, wheezing.  Gastrointestinal: Denies dysphagia, heartburn, reflux, water brash, pain, cramps, nausea, vomiting, bloating, diarrhea, constipation, hematemesis, melena, hematochezia, jaundice, hemorrhoids Genitourinary: Denies dysuria, frequency, urgency, nocturia, hesitancy, discharge, hematuria, flank pain Breast: Breast lumps, nipple discharge, bleeding.  Musculoskeletal: Denies arthralgia, myalgia, stiffness, Jt. Swelling, pain, limp, and strain/sprain. Denies falls. Skin: Denies puritis, rash, hives, warts, acne, eczema, changing in skin lesion Neuro: No weakness, tremor, incoordination, spasms, paresthesia, pain Psychiatric: Denies confusion, memory loss, sensory loss. Admits Depression, but feels controlled with current medications.  Endocrine: Denies change in weight, skin, hair change, nocturia, and paresthesia, diabetic polys, visual blurring, hyper / hypo glycemic episodes.  Heme/Lymph: No excessive bleeding, bruising, enlarged lymph nodes.  Physical Exam  BP 110/68   Pulse 60  Temp 97.9 F  Resp 16  Ht 5\' 5"    Wt 155 lb 12.8 oz          BMI 25.93   General Appearance: Well nourished and in no apparent distress. Eyes: PERRLA, EOMs, conjunctiva no swelling or erythema, normal fundi and vessels. Sinuses: No frontal/maxillary tenderness ENT/Mouth: EACs patent / TMs  nl. Nares clear without erythema, swelling, mucoid exudates. Oral hygiene is good. No erythema, swelling, or exudate. Tongue normal, non-obstructing. Tonsils not swollen or erythematous. Hearing normal.  Neck: Supple, thyroid normal. No bruits, nodes or JVD. Respiratory: Respiratory effort normal.  BS equal and clear bilateral without rales, rhonci, wheezing or stridor. Cardio: Heart sounds are normal with regular rate and rhythm and no murmurs, rubs or gallops. Peripheral pulses are normal and equal bilaterally without edema. No aortic or femoral bruits. Chest: symmetric with normal  excursions and percussion. Breasts: Symmetric, without lumps, nipple discharge, retractions, or fibrocystic changes.  Abdomen: Flat, soft, with bowl sounds. Nontender, no guarding, rebound, hernias, masses, or organomegaly.  Lymphatics: Non tender without lymphadenopathy.  Musculoskeletal: Full ROM all peripheral extremities, joint stability, 5/5 strength, and normal gait. Skin: Warm and dry without rashes, lesions, cyanosis, clubbing or  ecchymosis.  Neuro: Cranial nerves intact, reflexes equal bilaterally. Normal muscle tone, no cerebellar symptoms. Sensation intact.  Pysch: Awake and oriented X 3, normal affect, Insight and Judgment appropriate.   Assessment and Plan  1. Encounter for general adult medical examination with abnormal findings   2. Essential hypertension  - Microalbumin / creatinine urine ratio - EKG 12-Lead - [place], RETROPERITNL ABD,  LTD - TSH  3. Hyperlipidemia  - Lipid panel  4. PreDiabetes  - Hemoglobin A1c - Insulin, fasting  5. Vitamin D deficiency  - Vit D  25 hydroxy (rtn osteoporosis monitoring)  6. Medication management  - Urine Microscopic - CBC with Differential - BASIC METABOLIC PANEL WITH GFR - Hepatic function panel - Magnesium  7. Screening for rectal cancer  - POC Hemoccult Bld/Stl (3-Cd Home Screen); Future  8. At low risk for fall   9. Depression  - Positive Screen & under treatment  Continue prudent diet as discussed, weight control, BP monitoring, regular exercise, and medications. Discussed med's effects and SE's. Screening labs and tests as requested with regular follow-up as recommended.

## 2014-05-04 ENCOUNTER — Encounter (HOSPITAL_COMMUNITY): Payer: Self-pay | Admitting: *Deleted

## 2014-05-04 ENCOUNTER — Emergency Department (HOSPITAL_COMMUNITY)
Admission: EM | Admit: 2014-05-04 | Discharge: 2014-05-04 | Disposition: A | Discharge status: Home / Self Care | Payer: Medicare Other | Attending: Emergency Medicine | Admitting: Emergency Medicine

## 2014-05-04 DIAGNOSIS — Z79899 Other long term (current) drug therapy: Secondary | ICD-10-CM | POA: Diagnosis not present

## 2014-05-04 DIAGNOSIS — L03113 Cellulitis of right upper limb: Secondary | ICD-10-CM | POA: Insufficient documentation

## 2014-05-04 DIAGNOSIS — K219 Gastro-esophageal reflux disease without esophagitis: Secondary | ICD-10-CM | POA: Diagnosis not present

## 2014-05-04 DIAGNOSIS — Y9389 Activity, other specified: Secondary | ICD-10-CM | POA: Diagnosis not present

## 2014-05-04 DIAGNOSIS — T24111A Burn of first degree of right thigh, initial encounter: Secondary | ICD-10-CM | POA: Insufficient documentation

## 2014-05-04 DIAGNOSIS — Z7982 Long term (current) use of aspirin: Secondary | ICD-10-CM | POA: Diagnosis not present

## 2014-05-04 DIAGNOSIS — T300 Burn of unspecified body region, unspecified degree: Secondary | ICD-10-CM

## 2014-05-04 DIAGNOSIS — Y9289 Other specified places as the place of occurrence of the external cause: Secondary | ICD-10-CM | POA: Diagnosis not present

## 2014-05-04 DIAGNOSIS — T24011A Burn of unspecified degree of right thigh, initial encounter: Secondary | ICD-10-CM | POA: Diagnosis present

## 2014-05-04 DIAGNOSIS — Y998 Other external cause status: Secondary | ICD-10-CM | POA: Insufficient documentation

## 2014-05-04 DIAGNOSIS — X100XXA Contact with hot drinks, initial encounter: Secondary | ICD-10-CM | POA: Insufficient documentation

## 2014-05-04 DIAGNOSIS — F329 Major depressive disorder, single episode, unspecified: Secondary | ICD-10-CM | POA: Diagnosis not present

## 2014-05-04 DIAGNOSIS — F419 Anxiety disorder, unspecified: Secondary | ICD-10-CM | POA: Diagnosis not present

## 2014-05-04 DIAGNOSIS — I10 Essential (primary) hypertension: Secondary | ICD-10-CM | POA: Diagnosis not present

## 2014-05-04 DIAGNOSIS — E785 Hyperlipidemia, unspecified: Secondary | ICD-10-CM | POA: Diagnosis not present

## 2014-05-04 DIAGNOSIS — L03115 Cellulitis of right lower limb: Secondary | ICD-10-CM

## 2014-05-04 MED ORDER — SILVER SULFADIAZINE 1 % EX CREA
TOPICAL_CREAM | Freq: Once | CUTANEOUS | Status: AC
  Administered 2014-05-04: 1 via TOPICAL
  Filled 2014-05-04: qty 85

## 2014-05-04 MED ORDER — SILVER SULFADIAZINE 1 % EX CREA: 1.0000 "application " | TOPICAL_CREAM | Freq: Every day | CUTANEOUS | Status: DC

## 2014-05-04 MED ORDER — CLINDAMYCIN HCL 150 MG PO CAPS: 450.0000 mg | ORAL_CAPSULE | Freq: Four times a day (QID) | ORAL | Status: DC
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
## 2014-05-04 NOTE — ED Notes (Signed)
Wounds dressed with silvadene cream, abd pads and tape.  Wound care instructions given to patient
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
## 2014-05-12 ENCOUNTER — Ambulatory Visit (INDEPENDENT_AMBULATORY_CARE_PROVIDER_SITE_OTHER): Payer: Medicare Other | Admitting: Physician Assistant

## 2014-05-12 VITALS — BP 132/80 | HR 60 | Temp 98.2°F | Resp 16 | Wt 155.0 lb

## 2014-05-12 DIAGNOSIS — L03311 Cellulitis of abdominal wall: Secondary | ICD-10-CM

## 2014-05-12 DIAGNOSIS — R350 Frequency of micturition: Secondary | ICD-10-CM

## 2014-05-12 DIAGNOSIS — T300 Burn of unspecified body region, unspecified degree: Secondary | ICD-10-CM

## 2014-05-12 DIAGNOSIS — X12XXXA Contact with other hot fluids, initial encounter: Secondary | ICD-10-CM

## 2014-05-12 MED ORDER — SULFAMETHOXAZOLE-TRIMETHOPRIM 800-160 MG PO TABS: ORAL_TABLET | ORAL | Status: DC

## 2014-05-12 NOTE — Patient Instructions (Signed)
Make sure you are washing the burns with soap and water, pat dry and apply dressing.  Dress once daily, can put silvadene cream on it OR vaseline with NON STICK GAUZE.   Burn Care Your skin is a natural barrier to infection. It is the largest organ of your body. Burns damage this natural protection. To help prevent infection, it is very important to follow your caregiver's instructions in the care of your burn. Burns are classified as:  First degree. There is only redness of the skin (erythema). No scarring is expected.  Second degree. There is blistering of the skin. Scarring may occur with deeper burns.  Third degree. All layers of the skin are injured, and scarring is expected. HOME CARE INSTRUCTIONS   Wash your hands well before changing your bandage.  Change your bandage as often as directed by your caregiver.  Remove the old bandage. If the bandage sticks, you may soak it off with cool, clean water.  Cleanse the burn thoroughly but gently with mild soap and water.  Pat the area dry with a clean, dry cloth.  Apply a thin layer of antibacterial cream to the burn.  Apply a clean bandage as instructed by your caregiver.  Keep the bandage as clean and dry as possible.  Elevate the affected area for the first 24 hours, then as instructed by your caregiver.  Only take over-the-counter or prescription medicines for pain, discomfort, or fever as directed by your caregiver. SEEK IMMEDIATE MEDICAL CARE IF:   You develop excessive pain.  You develop redness, tenderness, swelling, or red streaks near the burn.  The burned area develops yellowish-white fluid (pus) or a bad smell.  You have a fever. MAKE SURE YOU:   Understand these instructions.  Will watch your condition.  Will get help right away if you are not doing well or get worse. Document Released: 04/30/2005 Document Revised: 07/23/2011 Document Reviewed: 09/20/2010 [facility] Patient Information 2015 [place], [place].  This information is not intended to replace advice given to you by your health care provider. Make sure you discuss any questions you have with your health care provider.
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
## 2014-05-13 ENCOUNTER — Other Ambulatory Visit: Payer: Self-pay

## 2014-05-13 LAB — URINALYSIS, ROUTINE W REFLEX MICROSCOPIC
Bilirubin Urine: NEGATIVE
Glucose, UA: NEGATIVE mg/dL
Hgb urine dipstick: NEGATIVE
KETONES UR: NEGATIVE mg/dL
Leukocytes, UA: NEGATIVE
NITRITE: NEGATIVE
Protein, ur: NEGATIVE mg/dL
SPECIFIC GRAVITY, URINE: 1.021 (ref 1.005–1.030)
UROBILINOGEN UA: 1 mg/dL (ref 0.0–1.0)
pH: 6.5 (ref 5.0–8.0)

## 2014-05-14 LAB — URINE CULTURE
Colony Count: NO GROWTH
Organism ID, Bacteria: NO GROWTH

## 2014-05-31 ENCOUNTER — Other Ambulatory Visit: Payer: Self-pay | Admitting: *Deleted

## 2014-05-31 ENCOUNTER — Other Ambulatory Visit: Payer: Self-pay | Admitting: Internal Medicine

## 2014-05-31 MED ORDER — ESOMEPRAZOLE MAGNESIUM 40 MG PO CPDR: 40.0000 mg | DELAYED_RELEASE_CAPSULE | Freq: Every day | ORAL | Status: DC

## 2014-06-09 ENCOUNTER — Ambulatory Visit (INDEPENDENT_AMBULATORY_CARE_PROVIDER_SITE_OTHER): Payer: Medicare Other | Admitting: Physician Assistant

## 2014-06-09 ENCOUNTER — Encounter: Payer: Self-pay | Admitting: Physician Assistant

## 2014-06-09 VITALS — BP 130/80 | HR 68 | Temp 97.9°F | Resp 16 | Ht 65.0 in | Wt 159.0 lb

## 2014-06-09 DIAGNOSIS — I10 Essential (primary) hypertension: Secondary | ICD-10-CM

## 2014-06-09 DIAGNOSIS — K589 Irritable bowel syndrome without diarrhea: Secondary | ICD-10-CM

## 2014-06-09 DIAGNOSIS — F1020 Alcohol dependence, uncomplicated: Secondary | ICD-10-CM

## 2014-06-09 DIAGNOSIS — Z23 Encounter for immunization: Secondary | ICD-10-CM

## 2014-06-09 DIAGNOSIS — N3281 Overactive bladder: Secondary | ICD-10-CM | POA: Insufficient documentation

## 2014-06-09 DIAGNOSIS — R7309 Other abnormal glucose: Secondary | ICD-10-CM

## 2014-06-09 DIAGNOSIS — Z79899 Other long term (current) drug therapy: Secondary | ICD-10-CM

## 2014-06-09 DIAGNOSIS — E559 Vitamin D deficiency, unspecified: Secondary | ICD-10-CM

## 2014-06-09 DIAGNOSIS — Z0001 Encounter for general adult medical examination with abnormal findings: Secondary | ICD-10-CM | POA: Diagnosis not present

## 2014-06-09 DIAGNOSIS — K2100 Gastro-esophageal reflux disease with esophagitis, without bleeding: Secondary | ICD-10-CM

## 2014-06-09 DIAGNOSIS — F329 Major depressive disorder, single episode, unspecified: Secondary | ICD-10-CM

## 2014-06-09 DIAGNOSIS — F32A Depression, unspecified: Secondary | ICD-10-CM

## 2014-06-09 DIAGNOSIS — R6889 Other general symptoms and signs: Secondary | ICD-10-CM

## 2014-06-09 DIAGNOSIS — R350 Frequency of micturition: Secondary | ICD-10-CM | POA: Diagnosis not present

## 2014-06-09 DIAGNOSIS — F419 Anxiety disorder, unspecified: Secondary | ICD-10-CM

## 2014-06-09 DIAGNOSIS — R296 Repeated falls: Secondary | ICD-10-CM

## 2014-06-09 DIAGNOSIS — Z9181 History of falling: Secondary | ICD-10-CM

## 2014-06-09 DIAGNOSIS — E785 Hyperlipidemia, unspecified: Secondary | ICD-10-CM

## 2014-06-09 MED ORDER — SOLIFENACIN SUCCINATE 5 MG PO TABS: 5.0000 mg | ORAL_TABLET | Freq: Every day | ORAL | Status: DC
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
## 2014-06-09 NOTE — Patient Instructions (Addendum)
Stop the fluid pill/Hydrochlorthiazide for 2-3 days and see if this helps with the frequency urination- if this helps start back every other day Will give samples of vesicare 5mg  and 10mg , if she continue to have incontinence will send her to urology.   Overactive Bladder The bladder has two functions that are totally opposite of the other. One is to relax and stretch out so it can store urine (fills like a balloon), and the other is to contract and squeeze down so that it can empty the urine that it has stored. Proper functioning of the bladder is a complex mixing of these two functions. The filling and emptying of the bladder can be influenced by:  The bladder.  The spinal cord.  The brain.  The nerves going to the bladder.  Other organs that are closely related to the bladder such as prostate in males and the vagina in females. As your bladder fills with urine, nerve signals are sent from the bladder to the brain to tell you that you may need to urinate. Normal urination requires that the bladder squeeze down with sufficient strength to empty the bladder, but this also requires that the bladder squeeze down sufficiently long to finish the job. In addition the sphincter muscles, which normally keep you from leaking urine, must also relax so that the urine can pass. Coordination between the bladder muscle squeezing down and the sphincter muscles relaxing is required to make everything happen normally. With an overactive bladder sometimes the muscles of the bladder contract unexpectedly and involuntarily and this causes an urgent need to urinate. The normal response is to try to hold urine in by contracting the sphincter muscles. Sometimes the bladder contracts so strongly that the sphincter muscles cannot stop the urine from passing out and incontinence occurs. This kind of incontinence is called urge incontinence. Having an overactive bladder can be embarrassing and awkward. It can keep you from  living life the way you want to. Many people think it is just something you have to put up with as you grow older or have certain health conditions. In fact, there are treatments that can help make your life easier and more pleasant. CAUSES  Many things can cause an overactive bladder. Possibilities include:  Urinary tract infection or infection of nearby tissues such as the prostate.  Prostate enlargement.  In women, multiple pregnancies or surgery on the uterus or urethra.  Bladder stones, inflammation, or tumors.  Caffeine.  Alcohol.  Medications. For example, diuretics (drugs that help the body get rid of extra fluid) increase urine production. Some other medicines must be taken with lots of fluids.  Muscle or nerve weakness. This might be the result of a spinal cord injury, a stroke, multiple sclerosis, or Parkinson disease.  Diabetes can cause a high urine volume which fills the bladder so quickly that the normal urge to urinate is triggered very strongly. SYMPTOMS   Loss of bladder control. You feel the need to urinate and cannot make your body wait.  Sudden, strong urges to urinate.  Urinating 8 or more times a day.  Waking up to urinate two or more times a night. DIAGNOSIS  To decide if you have overactive bladder, your health care provider will probably:  Ask about symptoms you have noticed.  Ask about your overall health. This will include questions about any medications you are taking.  Do a physical examination. This will help determine if there are obvious blockages or other problems.  Order some  tests. These might include:  A blood test to check for diabetes or other health issues that could be contributing to the problem.  Urine testing. This could measure the flow of urine and the pressure on the bladder.  A test of your neurological system (the brain, spinal cord, and nerves). This is the system that senses the need to urinate. Some of these tests are  called flow tests, bladder pressure tests, and electrical measurements of the sphincter muscle.  A bladder test to check whether it is emptying completely when you urinate.  Cystoscopy. This test uses a thin tube with a tiny camera on it. It offers a look inside your urethra and bladder to see if there are problems.  Imaging tests. You might be given a contrast dye and then asked to urinate. X-rays are taken to see how your bladder is working. TREATMENT  An overactive bladder can be treated in many ways. The treatment will depend on the cause. Whether you have a mild or severe case also makes a difference. Often, treatment can be given in your health care provider's office or clinic. Be sure to discuss the different options with your caregiver. They include:  Behavioral treatments. These do not involve medication or surgery:  Bladder training. For this, you would follow a schedule to urinate at regular intervals. This helps you learn to control the urge to urinate. At first, you might be asked to wait a few minutes after feeling the urge. In time, you should be able to schedule bathroom visits an hour or more apart.  Kegel exercises. These exercises strengthen the pelvic floor muscles, which support the bladder. Toning these muscles can help control urination even if the bladder muscles are overactive. A specialist will teach you how to do these exercises correctly. They will require daily practice.  Weight loss. If you are obese or overweight, losing weight might stop your bladder from being overactive. Talk to your health care provider about how many pounds you should lose. Also ask if there is a specific program or method that would work best for you.  Diet change. This might be suggested if constipation is making your overactive bladder worse. Your health care provider or a nutritionist can explain ways to change what you eat to ease constipation. Other people might need to take in less  caffeine or alcohol. Sometimes drinking fewer fluids is needed, too.  Protection. This is not an actual treatment. But, you could wear special pads to take care of any leakage while you wait for other treatments to take effect. This will help you avoid embarrassment.  Physical treatments.  Electrical stimulation. Electrodes will send gentle pulses to the nerves or muscles that help control the bladder. The goal is to strengthen them. Sometimes this is done with the electrodes outside the body. Or, they might be placed inside the body (implanted). This treatment can take several months to have an effect.  Medications. These are usually used along with other treatments. Several medicines are available. Some are injected into the muscles involved in urination. Others come in pill form. Medications sometimes prescribed include:  Anticholinergics. These drugs block the signals that the nerves deliver to the bladder. This keeps it from releasing urine at the wrong time. Researchers think the drugs might help in other ways, too.  Imipramine. This is an antidepressant. But, it relaxes bladder muscles.  Botox. This is still experimental. Some people believe that injecting it into the bladder muscles will relax them so  they work more normally. It has also been injected into the sphincter muscle when the sphincter muscle does not open properly. This is a temporary fix, however. Also, it might make matters worse, especially in older people.  Surgery.  A device might be implanted to help manage your nerves. It works on the nerves that signal when you need to urinate.  Surgery is sometimes needed with electrical stimulation. If the electrodes are implanted, this is done through surgery.  Sometimes repairs need to be made through surgery. For example, the size of the bladder can be changed. This is usually done in severe cases only. HOME CARE INSTRUCTIONS   Take any medications your health care provider  prescribed or suggested. Follow the directions carefully.  Practice any lifestyle changes that are recommended. These might include:  Drinking less fluid or drinking at different times of the day. If you need to urinate often during the night, for example, you may need to stop drinking fluids early in the evening.  Cutting down on caffeine or alcohol. They can both make an overactive bladder worse. Caffeine is found in coffee, tea, and sodas.  Doing Kegel exercises to strengthen muscles.  Losing weight, if that is recommended.  Eating a healthy and balanced diet. This will help you avoid constipation.  Keep a journal or a log. You might be asked to record how much you drink and when, and also when you feel the need to urinate.  Learn how to care for implants or other devices, such as pessaries. SEEK MEDICAL CARE IF:   Your overactive bladder gets worse.  You feel increased pain or irritation when you urinate.  You notice blood in your urine.  You have questions about any medications or devices that your health care provider recommended.  You notice blood, pus, or swelling at the site of any test or treatment procedure.  You have an oral temperature above 102F (38.9C). SEEK IMMEDIATE MEDICAL CARE IF:  You have an oral temperature above 102F (38.9C), not controlled by medicine. Document Released: 02/24/2009 Document Revised: 09/14/2013 Document Reviewed: 02/24/2009 [facility] Patient Information 2015 [place], [place]. This information is not intended to replace advice given to you by your health care provider. Make sure you discuss any questions you have with your health care provider.   Edema Edema is an abnormal buildup of fluids in your bodytissues. Edema is somewhatdependent on gravity to pull the fluid to the lowest place in your body. That makes the condition more common in the legs and thighs (lower extremities). Painless swelling of the feet and ankles is common and  becomes more likely as you get older. It is also common in looser tissues, like around your eyes.  When the affected area is squeezed, the fluid may move out of that spot and leave a dent for a few moments. This dent is called pitting.  CAUSES  There are many possible causes of edema. Eating too much salt and being on your feet or sitting for a long time can cause edema in your legs and ankles. Hot weather may make edema worse. Common medical causes of edema include:  Heart failure.  Liver disease.  Kidney disease.  Weak blood vessels in your legs.  Cancer.  An injury.  Pregnancy.  Some medications.  Obesity. SYMPTOMS  Edema is usually painless.Your skin may look swollen or shiny.  DIAGNOSIS  Your health care provider may be able to diagnose edema by asking about your medical history and doing a physical  exam. You may need to have tests such as X-rays, an electrocardiogram, or blood tests to check for medical conditions that may cause edema.  TREATMENT  Edema treatment depends on the cause. If you have heart, liver, or kidney disease, you need the treatment appropriate for these conditions. General treatment may include:  Elevation of the affected body part above the level of your heart.  Compression of the affected body part. Pressure from elastic bandages or support stockings squeezes the tissues and forces fluid back into the blood vessels. This keeps fluid from entering the tissues.  Restriction of fluid and salt intake.  Use of a water pill (diuretic). These medications are appropriate only for some types of edema. They pull fluid out of your body and make you urinate more often. This gets rid of fluid and reduces swelling, but diuretics can have side effects. Only use diuretics as directed by your health care provider. HOME CARE INSTRUCTIONS   Keep the affected body part above the level of your heart when you are lying down.   Do not sit still or stand for prolonged  periods.   Do not put anything directly under your knees when lying down.  Do not wear constricting clothing or garters on your upper legs.   Exercise your legs to work the fluid back into your blood vessels. This may help the swelling go down.   Wear elastic bandages or support stockings to reduce ankle swelling as directed by your health care provider.   Eat a low-salt diet to reduce fluid if your health care provider recommends it.   Only take medicines as directed by your health care provider. SEEK MEDICAL CARE IF:   Your edema is not responding to treatment.  You have heart, liver, or kidney disease and notice symptoms of edema.  You have edema in your legs that does not improve after elevating them.   You have sudden and unexplained weight gain. SEEK IMMEDIATE MEDICAL CARE IF:   You develop shortness of breath or chest pain.   You cannot breathe when you lie down.  You develop pain, redness, or warmth in the swollen areas.   You have heart, liver, or kidney disease and suddenly get edema.  You have a fever and your symptoms suddenly get worse. MAKE SURE YOU:   Understand these instructions.  Will watch your condition.  Will get help right away if you are not doing well or get worse. Document Released: 04/30/2005 Document Revised: 09/14/2013 Document Reviewed: 02/20/2013 [facility] Patient Information 2015 [place], [place]. This information is not intended to replace advice given to you by your health care provider. Make sure you discuss any questions you have with your health care provider.

## 2014-06-10 LAB — URINE CULTURE
Colony Count: NO GROWTH
Organism ID, Bacteria: NO GROWTH

## 2014-06-10 LAB — URINALYSIS, ROUTINE W REFLEX MICROSCOPIC
BILIRUBIN URINE: NEGATIVE
GLUCOSE, UA: NEGATIVE mg/dL
HGB URINE DIPSTICK: NEGATIVE
Ketones, ur: NEGATIVE mg/dL
LEUKOCYTES UA: NEGATIVE
Nitrite: NEGATIVE
PH: 6.5 (ref 5.0–8.0)
Protein, ur: NEGATIVE mg/dL
SPECIFIC GRAVITY, URINE: 1.021 (ref 1.005–1.030)
UROBILINOGEN UA: 1 mg/dL (ref 0.0–1.0)

## 2014-06-24 ENCOUNTER — Other Ambulatory Visit: Payer: Self-pay | Admitting: *Deleted

## 2014-06-24 MED ORDER — ROSUVASTATIN CALCIUM 40 MG PO TABS: ORAL_TABLET | ORAL | Status: DC

## 2014-08-05 ENCOUNTER — Ambulatory Visit (INDEPENDENT_AMBULATORY_CARE_PROVIDER_SITE_OTHER): Payer: Medicare Other | Admitting: Physician Assistant

## 2014-08-05 ENCOUNTER — Encounter: Payer: Self-pay | Admitting: Physician Assistant

## 2014-08-05 VITALS — BP 120/80 | HR 60 | Temp 98.1°F | Resp 16 | Ht 65.0 in | Wt 157.0 lb

## 2014-08-05 DIAGNOSIS — F329 Major depressive disorder, single episode, unspecified: Secondary | ICD-10-CM

## 2014-08-05 DIAGNOSIS — E785 Hyperlipidemia, unspecified: Secondary | ICD-10-CM

## 2014-08-05 DIAGNOSIS — Z79899 Other long term (current) drug therapy: Secondary | ICD-10-CM

## 2014-08-05 DIAGNOSIS — F32A Depression, unspecified: Secondary | ICD-10-CM

## 2014-08-05 DIAGNOSIS — N3281 Overactive bladder: Secondary | ICD-10-CM

## 2014-08-05 DIAGNOSIS — R7309 Other abnormal glucose: Secondary | ICD-10-CM | POA: Diagnosis not present

## 2014-08-05 DIAGNOSIS — E559 Vitamin D deficiency, unspecified: Secondary | ICD-10-CM | POA: Diagnosis not present

## 2014-08-05 DIAGNOSIS — I10 Essential (primary) hypertension: Secondary | ICD-10-CM

## 2014-08-05 LAB — CBC WITH DIFFERENTIAL/PLATELET
BASOS ABS: 0.1 10*3/uL (ref 0.0–0.1)
BASOS PCT: 1 % (ref 0–1)
Eosinophils Absolute: 0.1 10*3/uL (ref 0.0–0.7)
Eosinophils Relative: 2 % (ref 0–5)
HCT: 41.2 % (ref 36.0–46.0)
Hemoglobin: 13.8 g/dL (ref 12.0–15.0)
LYMPHS PCT: 21 % (ref 12–46)
Lymphs Abs: 1.3 10*3/uL (ref 0.7–4.0)
MCH: 29.7 pg (ref 26.0–34.0)
MCHC: 33.5 g/dL (ref 30.0–36.0)
MCV: 88.6 fL (ref 78.0–100.0)
MONO ABS: 0.4 10*3/uL (ref 0.1–1.0)
MPV: 11.8 fL (ref 8.6–12.4)
Monocytes Relative: 7 % (ref 3–12)
Neutro Abs: 4.1 10*3/uL (ref 1.7–7.7)
Neutrophils Relative %: 69 % (ref 43–77)
PLATELETS: 176 10*3/uL (ref 150–400)
RBC: 4.65 MIL/uL (ref 3.87–5.11)
RDW: 13.2 % (ref 11.5–15.5)
WBC: 6 10*3/uL (ref 4.0–10.5)

## 2014-08-05 LAB — HEPATIC FUNCTION PANEL
ALT: 15 U/L (ref 0–35)
AST: 21 U/L (ref 0–37)
Albumin: 4.7 g/dL (ref 3.5–5.2)
Alkaline Phosphatase: 42 U/L (ref 39–117)
BILIRUBIN DIRECT: 0.1 mg/dL (ref 0.0–0.3)
BILIRUBIN INDIRECT: 0.4 mg/dL (ref 0.2–1.2)
TOTAL PROTEIN: 6.5 g/dL (ref 6.0–8.3)
Total Bilirubin: 0.5 mg/dL (ref 0.2–1.2)

## 2014-08-05 LAB — LIPID PANEL
Cholesterol: 181 mg/dL (ref 0–200)
HDL: 51 mg/dL (ref >=46)
LDL Cholesterol: 105 mg/dL — ABNORMAL HIGH (ref 0–99)
TRIGLYCERIDES: 126 mg/dL (ref <150)
Total CHOL/HDL Ratio: 3.5 Ratio
VLDL: 25 mg/dL (ref 0–40)

## 2014-08-05 LAB — BASIC METABOLIC PANEL WITH GFR
BUN: 23 mg/dL (ref 6–23)
CHLORIDE: 99 meq/L (ref 96–112)
CO2: 30 meq/L (ref 19–32)
Calcium: 9.7 mg/dL (ref 8.4–10.5)
Creat: 0.92 mg/dL (ref 0.50–1.10)
GFR, EST NON AFRICAN AMERICAN: 63 mL/min
GFR, Est African American: 72 mL/min
Glucose, Bld: 87 mg/dL (ref 70–99)
Potassium: 4.1 mEq/L (ref 3.5–5.3)
SODIUM: 139 meq/L (ref 135–145)

## 2014-08-05 LAB — MAGNESIUM: Magnesium: 2 mg/dL (ref 1.5–2.5)

## 2014-08-05 NOTE — Progress Notes (Signed)
Assessment and Plan:  1. Hypertension -Continue medication, monitor blood pressure at home. Continue DASH diet.  Reminder to go to the ER if any CP, SOB, nausea, dizziness, severe HA, changes vision/speech, left arm numbness and tingling and jaw pain.  2. Cholesterol -Continue diet and exercise. Check cholesterol.   3. Prediabetes  -Continue diet and exercise. Check A1C  4. Vitamin D Def - check level and continue medications.   5. Depression Depression- continue medications, stress management techniques discussed, increase water, good sleep hygiene discussed, increase exercise, and increase veggies.   Will try toviaz, declines urology referral.   Continue diet and meds as discussed. Further disposition pending results of labs. Over 30 minutes of exam, counseling, chart review, and critical decision making was performed  HPI 69 y.o. female  presents for 3 month follow up on hypertension, cholesterol, prediabetes, and vitamin D deficiency.   Her blood pressure has been controlled at home, today their BP is BP: 120/80 mmHg  She does not workout. She denies chest pain, shortness of breath, dizziness.  She is on cholesterol medication, she is on crestor 40 and denies myalgias. Her cholesterol is at goal. The cholesterol last visit was:   Lab Results  Component Value Date   CHOL 166 04/29/2014   HDL 59 04/29/2014   LDLCALC 89 04/29/2014   TRIG 88 04/29/2014   CHOLHDL 2.8 04/29/2014    She has been working on diet and exercise for prediabetes, and denies paresthesia of the feet, polydipsia, polyuria and visual disturbances. Last A1C in the office was:  Lab Results  Component Value Date   HGBA1C 5.6 04/29/2014  Patient is on Vitamin D supplement.   Lab Results  Component Value Date   VD25OH 43 04/29/2014   She has been under more stress recently, she states her daughter is a drug addict, and has a drug charge on her and her best friend passed Tuesday.   She states the vesicare and  the myrebetiq  Current Medications:  Current Outpatient Prescriptions on File Prior to Visit  Medication Sig Dispense Refill  . ALPRAZolam (XANAX) 1 MG tablet Take 1 mg by mouth 3 (three) times daily as needed. For anxiety.    Marland Kitchen aspirin EC 81 MG tablet Take 81 mg by mouth daily.    . bisoprolol-hydrochlorothiazide (ZIAC) 5-6.25 MG per tablet take 1 tablet by mouth once daily for blood pressure AND FLUID 90 tablet 2  . buPROPion (WELLBUTRIN XL) 150 MG 24 hr tablet Take 450 mg by mouth daily.    . Cholecalciferol (VITAMIN D3) 5000 UNITS CAPS Take 5,000 Units by mouth daily.     Marland Kitchen esomeprazole (NEXIUM) 40 MG capsule Take 1 capsule (40 mg total) by mouth daily at 12 noon. 30 capsule 3  . gabapentin (NEURONTIN) 300 MG capsule take 1 to 2 capsules by mouth at bedtime 60 capsule 2  . hydrochlorothiazide (MICROZIDE) 12.5 MG capsule take 1 capsule by mouth once daily for BLOOD PRESSURE / FLUID. 90 capsule PRN  . Iron-DSS-B12-FA-C-E-Cu-Biotin (HEMAX) 150-1 MG TABS Take 150 mg by mouth daily.    Marland Kitchen lamoTRIgine (LAMICTAL) 25 MG tablet Take 1 tablet 2 x daily 60 tablet 99  . rosuvastatin (CRESTOR) 40 MG tablet take 1/2 to 1 tablet by mouth once daily as directed for cholesterol 30 tablet 2  . solifenacin (VESICARE) 5 MG tablet Take 1 tablet (5 mg total) by mouth daily. 30 tablet 0   No current facility-administered medications on file prior to visit.  Medical History:  Past Medical History  Diagnosis Date  . Hypertension   . Hyperlipidemia   . GERD (gastroesophageal reflux disease)   . Anxiety   . Depression   . Alcoholism   . IBS (irritable bowel syndrome)   . Elevated hemoglobin A1c    Allergies:  Allergies  Allergen Reactions  . Lipitor [Atorvastatin]     Fatigue  . Prednisone Other (See Comments)    Change in mental status    Review of Systems:  Review of Systems  Constitutional: Negative.   HENT: Negative.   Eyes: Negative.   Respiratory: Negative.   Cardiovascular: Negative.    Gastrointestinal: Negative.   Genitourinary: Positive for urgency and frequency. Negative for dysuria, hematuria and flank pain.  Musculoskeletal: Positive for back pain and joint pain. Negative for myalgias, falls and neck pain.  Skin: Negative.   Neurological: Negative.   Endo/Heme/Allergies: Negative.   Psychiatric/Behavioral: Negative.     Family history- Review and unchanged Social history- Review and unchanged Physical Exam: BP 120/80 mmHg  Pulse 60  Temp(Src) 98.1 F (36.7 C)  Resp 16  Ht 5\' 5"  (1.651 m)  Wt 157 lb (71.215 kg)  BMI 26.13 kg/m2 Wt Readings from Last 3 Encounters:  08/05/14 157 lb (71.215 kg)  06/09/14 159 lb (72.122 kg)  05/12/14 155 lb (70.308 kg)   General Appearance: Well nourished, in no apparent distress. Eyes: PERRLA, EOMs, conjunctiva no swelling or erythema Sinuses: No Frontal/maxillary tenderness ENT/Mouth: Ext aud canals clear, TMs without erythema, bulging. No erythema, swelling, or exudate on post pharynx.  Tonsils not swollen or erythematous. Hearing normal.  Neck: Supple, thyroid normal.  Respiratory: Respiratory effort normal, BS equal bilaterally without rales, rhonchi, wheezing or stridor.  Cardio: RRR with systolic 3/6 murmur Brisk peripheral pulses without edema.  Abdomen: Soft, + BS,  Non tender, no guarding, rebound, hernias, masses. Lymphatics: Non tender without lymphadenopathy.  Musculoskeletal: Full ROM, 5/5 strength, Normal gait Skin: Warm, dry without rashes, lesions, ecchymosis.  Neuro: Cranial nerves intact. Normal muscle tone, no cerebellar symptoms. Psych: Awake and oriented X 3, normal affect, Insight and Judgment appropriate.    Vicie Mutters, PA-C 3:10 PM [facility]

## 2014-08-05 NOTE — Patient Instructions (Signed)
Adjustment Disorder Most changes in life can cause stress. Getting used to changes may take a few months or longer. If feelings of stress, hopelessness, or worry continue, you may have an adjustment disorder. This stress-related mental health problem may affect your feelings, thinking and how you act. It occurs in both sexes and happens at any age. SYMPTOMS  Some of the following problems may be seen and vary from person to person:  Sadness or depression.  Loss of enjoyment.  Thoughts of suicide.  Fighting.  Avoiding family and friends.  Poor school performance.  Hopelessness, sense of loss.  Trouble sleeping.  Vandalism.  Worry, weight loss or gain.  Crying spells.  Anxiety  Reckless driving.  Skipping school.  Poor work Systems analyst.  Nervousness.  Ignoring bills.  Poor attitude. DIAGNOSIS  Your caregiver will ask what has happened in your life and do a physical exam. They will make a diagnosis of an adjustment disorder when they are sure another problem or medical illness causing your feelings does not exist. TREATMENT  When problems caused by stress interfere with you daily life or last longer than a few months, you may need counseling for an adjustment disorder. Early treatment may diminish problems and help you to better cope with the stressful events in your life. Sometimes medication is necessary. Individual counseling and or support groups can be very helpful. PROGNOSIS  Adjustment disorders usually last less than 3 to 6 months. The condition may persist if there is long lasting stress. This could include health problems, relationship problems, or job difficulties where you can not easily escape from what is causing the problem. PREVENTION  Even the most mentally healthy, highly functioning people can suffer from an adjustment disorder given a significant blow from a life-changing event. There is no way to prevent pain and loss. Most people need help from time  to time. You are not alone. SEEK MEDICAL CARE IF:  Your feelings or symptoms listed above do not improve or worsen. Document Released: 01/02/2006 Document Revised: 07/23/2011 Document Reviewed: 03/26/2007 [facility] Patient Information 2015 [place], [place]. This information is not intended to replace advice given to you by your health care provider. Make sure you discuss any questions you have with your health care provider. Stress and Stress Management Stress is a normal reaction to life events. It is what you feel when life demands more than you are used to or more than you can handle. Some stress can be useful. For example, the stress reaction can help you catch the last bus of the day, study for a test, or meet a deadline at work. But stress that occurs too often or for too long can cause problems. It can affect your emotional health and interfere with relationships and normal daily activities. Too much stress can weaken your immune system and increase your risk for physical illness. If you already have a medical problem, stress can make it worse. CAUSES  All sorts of life events may cause stress. An event that causes stress for one person may not be stressful for another person. Major life events commonly cause stress. These may be positive or negative. Examples include losing your job, moving into a new home, getting married, having a baby, or losing a loved one. Less obvious life events may also cause stress, especially if they occur day after day or in combination. Examples include working long hours, driving in traffic, caring for children, being in debt, or being in a difficult relationship. SIGNS AND SYMPTOMS  Stress may cause emotional symptoms including, the following:  Anxiety. This is feeling worried, afraid, on edge, overwhelmed, or out of control.  Anger. This is feeling irritated or impatient.  Depression. This is feeling sad, down, helpless, or guilty.  Difficulty focusing,  remembering, or making decisions. Stress may cause physical symptoms, including the following:   Aches and pains. These may affect your head, neck, back, stomach, or other areas of your body.  Tight muscles or clenched jaw.  Low energy or trouble sleeping. Stress may cause unhealthy behaviors, including the following:   Eating to feel better (overeating) or skipping meals.  Sleeping too little, too much, or both.  Working too much or putting off tasks (procrastination).  Smoking, drinking alcohol, or using drugs to feel better. DIAGNOSIS  Stress is diagnosed through an assessment by your health care provider. Your health care provider will ask questions about your symptoms and any stressful life events.Your health care provider will also ask about your medical history and may order blood tests or other tests. Certain medical conditions and medicine can cause physical symptoms similar to stress. Mental illness can cause emotional symptoms and unhealthy behaviors similar to stress. Your health care provider may refer you to a mental health professional for further evaluation.  TREATMENT  Stress management is the recommended treatment for stress.The goals of stress management are reducing stressful life events and coping with stress in healthy ways.  Techniques for reducing stressful life events include the following:  Stress identification. Self-monitor for stress and identify what causes stress for you. These skills may help you to avoid some stressful events.  Time management. Set your priorities, keep a calendar of events, and learn to say "no." These tools can help you avoid making too many commitments. Techniques for coping with stress include the following:  Rethinking the problem. Try to think realistically about stressful events rather than ignoring them or overreacting. Try to find the positives in a stressful situation rather than focusing on the negatives.  Exercise.  Physical exercise can release both physical and emotional tension. The key is to find a form of exercise you enjoy and do it regularly.  Relaxation techniques. These relax the body and mind. Examples include yoga, meditation, tai chi, biofeedback, deep breathing, progressive muscle relaxation, listening to music, being out in nature, journaling, and other hobbies. Again, the key is to find one or more that you enjoy and can do regularly.  Healthy lifestyle. Eat a balanced diet, get plenty of sleep, and do not smoke. Avoid using alcohol or drugs to relax.  Strong support network. Spend time with family, friends, or other people you enjoy being around.Express your feelings and talk things over with someone you trust. Counseling or talktherapy with a mental health professional may be helpful if you are having difficulty managing stress on your own. Medicine is typically not recommended for the treatment of stress.Talk to your health care provider if you think you need medicine for symptoms of stress. HOME CARE INSTRUCTIONS  Keep all follow-up visits as directed by your health care provider.  Take all medicines as directed by your health care provider. SEEK MEDICAL CARE IF:  Your symptoms get worse or you start having new symptoms.  You feel overwhelmed by your problems and can no longer manage them on your own. SEEK IMMEDIATE MEDICAL CARE IF:  You feel like hurting yourself or someone else. Document Released: 10/24/2000 Document Revised: 09/14/2013 Document Reviewed: 12/23/2012 [facility] Patient Information 2015 [place], [place]. This information  is not intended to replace advice given to you by your health care provider. Make sure you discuss any questions you have with your health care provider.

## 2014-08-06 LAB — VITAMIN D 25 HYDROXY (VIT D DEFICIENCY, FRACTURES): Vit D, 25-Hydroxy: 60 ng/mL (ref 30–100)

## 2014-08-06 LAB — TSH: TSH: 2.127 u[IU]/mL (ref 0.350–4.500)

## 2014-08-09 ENCOUNTER — Other Ambulatory Visit: Payer: Self-pay | Admitting: Internal Medicine

## 2014-08-09 MED ORDER — ROSUVASTATIN CALCIUM 40 MG PO TABS: ORAL_TABLET | ORAL | Status: DC

## 2014-09-29 ENCOUNTER — Other Ambulatory Visit: Payer: Self-pay | Admitting: Internal Medicine

## 2014-10-12 DIAGNOSIS — H5702 Anisocoria: Secondary | ICD-10-CM | POA: Diagnosis not present

## 2014-10-12 DIAGNOSIS — H2513 Age-related nuclear cataract, bilateral: Secondary | ICD-10-CM | POA: Diagnosis not present

## 2014-10-12 DIAGNOSIS — H40013 Open angle with borderline findings, low risk, bilateral: Secondary | ICD-10-CM | POA: Diagnosis not present

## 2014-10-12 DIAGNOSIS — H524 Presbyopia: Secondary | ICD-10-CM | POA: Diagnosis not present

## 2014-10-15 ENCOUNTER — Other Ambulatory Visit: Payer: Self-pay | Admitting: Orthopedic Surgery

## 2014-10-15 ENCOUNTER — Encounter (HOSPITAL_COMMUNITY): Payer: Self-pay | Admitting: *Deleted

## 2014-10-15 DIAGNOSIS — S52501A Unspecified fracture of the lower end of right radius, initial encounter for closed fracture: Secondary | ICD-10-CM | POA: Diagnosis not present

## 2014-10-15 DIAGNOSIS — S93402A Sprain of unspecified ligament of left ankle, initial encounter: Secondary | ICD-10-CM | POA: Diagnosis not present

## 2014-10-15 DIAGNOSIS — S52531A Colles' fracture of right radius, initial encounter for closed fracture: Secondary | ICD-10-CM | POA: Diagnosis not present

## 2014-10-15 NOTE — Progress Notes (Signed)
Pt denies SOB, chest pain, and being under the care of a cardiologist. Pt denies having a stress test and cardiac cath. Pt made aware to stop taking otc vitamins, Aspirin and herbal medications. Do not take any NSAIDs ie: Ibuprofen, Advil, Naproxen or any medication containing Aspirin. Pt and pt friend ( pt  consent) verbalized understanding of all pre-op instructions.

## 2014-10-17 MED ORDER — CEFAZOLIN SODIUM-DEXTROSE 2-3 GM-% IV SOLR
2.0000 g | INTRAVENOUS | Status: AC
  Administered 2014-10-18: 2 g via INTRAVENOUS
  Filled 2014-10-17: qty 50

## 2014-10-18 ENCOUNTER — Ambulatory Visit (HOSPITAL_COMMUNITY): Payer: Medicare Other | Admitting: Certified Registered Nurse Anesthetist

## 2014-10-18 ENCOUNTER — Encounter (HOSPITAL_COMMUNITY): Payer: Self-pay | Admitting: *Deleted

## 2014-10-18 ENCOUNTER — Ambulatory Visit (HOSPITAL_COMMUNITY)
Admission: RE | Admit: 2014-10-18 | Discharge: 2014-10-18 | Disposition: A | Discharge status: Home / Self Care | Payer: Medicare Other | Source: Ambulatory Visit | Attending: Orthopedic Surgery | Admitting: Orthopedic Surgery

## 2014-10-18 ENCOUNTER — Encounter (HOSPITAL_COMMUNITY)
Admission: RE | Disposition: A | Discharge status: Home / Self Care | Payer: Self-pay | Source: Ambulatory Visit | Attending: Orthopedic Surgery

## 2014-10-18 DIAGNOSIS — S52571B Other intraarticular fracture of lower end of right radius, initial encounter for open fracture type I or II: Secondary | ICD-10-CM | POA: Diagnosis not present

## 2014-10-18 DIAGNOSIS — F419 Anxiety disorder, unspecified: Secondary | ICD-10-CM | POA: Diagnosis not present

## 2014-10-18 DIAGNOSIS — K589 Irritable bowel syndrome without diarrhea: Secondary | ICD-10-CM | POA: Diagnosis not present

## 2014-10-18 DIAGNOSIS — E785 Hyperlipidemia, unspecified: Secondary | ICD-10-CM | POA: Diagnosis not present

## 2014-10-18 DIAGNOSIS — I350 Nonrheumatic aortic (valve) stenosis: Secondary | ICD-10-CM | POA: Diagnosis not present

## 2014-10-18 DIAGNOSIS — F329 Major depressive disorder, single episode, unspecified: Secondary | ICD-10-CM | POA: Diagnosis not present

## 2014-10-18 DIAGNOSIS — Z79899 Other long term (current) drug therapy: Secondary | ICD-10-CM | POA: Insufficient documentation

## 2014-10-18 DIAGNOSIS — S52571A Other intraarticular fracture of lower end of right radius, initial encounter for closed fracture: Secondary | ICD-10-CM | POA: Insufficient documentation

## 2014-10-18 DIAGNOSIS — Y999 Unspecified external cause status: Secondary | ICD-10-CM | POA: Insufficient documentation

## 2014-10-18 DIAGNOSIS — I10 Essential (primary) hypertension: Secondary | ICD-10-CM | POA: Diagnosis not present

## 2014-10-18 DIAGNOSIS — G8918 Other acute postprocedural pain: Secondary | ICD-10-CM | POA: Diagnosis not present

## 2014-10-18 DIAGNOSIS — Z87891 Personal history of nicotine dependence: Secondary | ICD-10-CM | POA: Diagnosis not present

## 2014-10-18 DIAGNOSIS — G5601 Carpal tunnel syndrome, right upper limb: Secondary | ICD-10-CM | POA: Insufficient documentation

## 2014-10-18 DIAGNOSIS — Z7982 Long term (current) use of aspirin: Secondary | ICD-10-CM | POA: Diagnosis not present

## 2014-10-18 DIAGNOSIS — M25531 Pain in right wrist: Secondary | ICD-10-CM | POA: Diagnosis present

## 2014-10-18 DIAGNOSIS — S52501A Unspecified fracture of the lower end of right radius, initial encounter for closed fracture: Secondary | ICD-10-CM | POA: Diagnosis not present

## 2014-10-18 DIAGNOSIS — Y939 Activity, unspecified: Secondary | ICD-10-CM | POA: Diagnosis not present

## 2014-10-18 DIAGNOSIS — Y929 Unspecified place or not applicable: Secondary | ICD-10-CM | POA: Insufficient documentation

## 2014-10-18 DIAGNOSIS — K219 Gastro-esophageal reflux disease without esophagitis: Secondary | ICD-10-CM | POA: Diagnosis not present

## 2014-10-18 DIAGNOSIS — W010XXA Fall on same level from slipping, tripping and stumbling without subsequent striking against object, initial encounter: Secondary | ICD-10-CM | POA: Insufficient documentation

## 2014-10-18 HISTORY — DX: Fracture of unspecified carpal bone, right wrist, initial encounter for closed fracture: S62.101A

## 2014-10-18 HISTORY — PX: ORIF WRIST FRACTURE: SHX2133

## 2014-10-18 HISTORY — PX: CARPAL TUNNEL RELEASE: SHX101

## 2014-10-18 HISTORY — DX: Cardiac murmur, unspecified: R01.1

## 2014-10-18 LAB — CBC
HCT: 38.2 % (ref 36.0–46.0)
HEMOGLOBIN: 12.6 g/dL (ref 12.0–15.0)
MCH: 29.3 pg (ref 26.0–34.0)
MCHC: 33 g/dL (ref 30.0–36.0)
MCV: 88.8 fL (ref 78.0–100.0)
PLATELETS: 148 10*3/uL — AB (ref 150–400)
RBC: 4.3 MIL/uL (ref 3.87–5.11)
RDW: 13.1 % (ref 11.5–15.5)
WBC: 6 10*3/uL (ref 4.0–10.5)

## 2014-10-18 LAB — BASIC METABOLIC PANEL
Anion gap: 10 (ref 5–15)
BUN: 27 mg/dL — ABNORMAL HIGH (ref 6–20)
CHLORIDE: 109 mmol/L (ref 101–111)
CO2: 24 mmol/L (ref 22–32)
CREATININE: 0.94 mg/dL (ref 0.44–1.00)
Calcium: 9.1 mg/dL (ref 8.9–10.3)
GFR calc Af Amer: >60 mL/min (ref >60)
GFR, EST NON AFRICAN AMERICAN: 60 mL/min — AB (ref >60)
Glucose, Bld: 81 mg/dL (ref 65–99)
Potassium: 3.5 mmol/L (ref 3.5–5.1)
SODIUM: 143 mmol/L (ref 135–145)

## 2014-10-18 SURGERY — OPEN REDUCTION INTERNAL FIXATION (ORIF) WRIST FRACTURE
Anesthesia: General | Site: Wrist | Laterality: Right

## 2014-10-18 MED ORDER — PROPOFOL 10 MG/ML IV BOLUS
INTRAVENOUS | Status: AC
  Filled 2014-10-18: qty 20

## 2014-10-18 MED ORDER — MIDAZOLAM HCL 2 MG/2ML IJ SOLN: 0.5000 mg | Freq: Once | INTRAMUSCULAR | Status: DC | PRN

## 2014-10-18 MED ORDER — MEPERIDINE HCL 25 MG/ML IJ SOLN: 6.2500 mg | INTRAMUSCULAR | Status: DC | PRN

## 2014-10-18 MED ORDER — HYDROMORPHONE HCL 1 MG/ML IJ SOLN: 0.2500 mg | INTRAMUSCULAR | Status: DC | PRN

## 2014-10-18 MED ORDER — MIDAZOLAM HCL 2 MG/2ML IJ SOLN
INTRAMUSCULAR | Status: AC
  Filled 2014-10-18: qty 2

## 2014-10-18 MED ORDER — FENTANYL CITRATE (PF) 100 MCG/2ML IJ SOLN
INTRAMUSCULAR | Status: DC | PRN
  Administered 2014-10-18 (×2): 50 ug via INTRAVENOUS

## 2014-10-18 MED ORDER — LACTATED RINGERS IV SOLN
INTRAVENOUS | Status: DC
  Administered 2014-10-18: 11:00:00 via INTRAVENOUS

## 2014-10-18 MED ORDER — HYDROCODONE-ACETAMINOPHEN 10-325 MG PO TABS: 1.0000 | ORAL_TABLET | Freq: Four times a day (QID) | ORAL | Status: DC | PRN

## 2014-10-18 MED ORDER — FENTANYL CITRATE (PF) 100 MCG/2ML IJ SOLN
INTRAMUSCULAR | Status: AC
  Filled 2014-10-18: qty 2

## 2014-10-18 MED ORDER — PROMETHAZINE HCL 25 MG/ML IJ SOLN: 6.2500 mg | INTRAMUSCULAR | Status: DC | PRN

## 2014-10-18 MED ORDER — PHENYLEPHRINE HCL 10 MG/ML IJ SOLN
10.0000 mg | INTRAVENOUS | Status: DC | PRN
  Administered 2014-10-18: 25 ug/min via INTRAVENOUS

## 2014-10-18 MED ORDER — LACTATED RINGERS IV SOLN
INTRAVENOUS | Status: DC
  Administered 2014-10-18 (×2): via INTRAVENOUS

## 2014-10-18 MED ORDER — CHLORHEXIDINE GLUCONATE 4 % EX LIQD: 60.0000 mL | Freq: Once | CUTANEOUS | Status: DC

## 2014-10-18 MED ORDER — MIDAZOLAM HCL 5 MG/5ML IJ SOLN
INTRAMUSCULAR | Status: DC | PRN
  Administered 2014-10-18: 1 mg via INTRAVENOUS

## 2014-10-18 MED ORDER — PHENYLEPHRINE HCL 10 MG/ML IJ SOLN
INTRAMUSCULAR | Status: DC | PRN
  Administered 2014-10-18: 80 ug via INTRAVENOUS
  Administered 2014-10-18: 40 ug via INTRAVENOUS
  Administered 2014-10-18 (×2): 80 ug via INTRAVENOUS
  Administered 2014-10-18: 40 ug via INTRAVENOUS
  Administered 2014-10-18: 80 ug via INTRAVENOUS
  Administered 2014-10-18: 40 ug via INTRAVENOUS

## 2014-10-18 MED ORDER — BUPIVACAINE-EPINEPHRINE (PF) 0.5% -1:200000 IJ SOLN
INTRAMUSCULAR | Status: DC | PRN
  Administered 2014-10-18: 30 mL via PERINEURAL

## 2014-10-18 MED ORDER — PROPOFOL 10 MG/ML IV BOLUS
INTRAVENOUS | Status: DC | PRN
  Administered 2014-10-18: 150 mg via INTRAVENOUS

## 2014-10-18 MED ORDER — FENTANYL CITRATE (PF) 250 MCG/5ML IJ SOLN
INTRAMUSCULAR | Status: AC
  Filled 2014-10-18: qty 5

## 2014-10-18 MED ORDER — MIDAZOLAM HCL 5 MG/ML IJ SOLN
0.5000 mg | Freq: Once | INTRAMUSCULAR | Status: AC
  Administered 2014-10-18: 0.5 mg via INTRAVENOUS

## 2014-10-18 MED ORDER — CEFAZOLIN SODIUM 1-5 GM-% IV SOLN
INTRAVENOUS | Status: AC
  Filled 2014-10-18: qty 50

## 2014-10-18 MED ORDER — FENTANYL CITRATE (PF) 100 MCG/2ML IJ SOLN
50.0000 ug | Freq: Once | INTRAMUSCULAR | Status: AC
  Administered 2014-10-18: 50 ug via INTRAVENOUS

## 2014-10-18 MED ORDER — LIDOCAINE-EPINEPHRINE (PF) 1.5 %-1:200000 IJ SOLN
INTRAMUSCULAR | Status: DC | PRN
  Administered 2014-10-18: 25 mL via PERINEURAL

## 2014-10-18 MED ORDER — CEFAZOLIN SODIUM 1-5 GM-% IV SOLN
1.0000 g | Freq: Once | INTRAVENOUS | Status: AC
  Administered 2014-10-18: 1 g via INTRAVENOUS

## 2014-10-18 MED ORDER — EPHEDRINE SULFATE 50 MG/ML IJ SOLN
INTRAMUSCULAR | Status: DC | PRN
  Administered 2014-10-18: 10 mg via INTRAVENOUS
  Administered 2014-10-18: 5 mg via INTRAVENOUS
  Administered 2014-10-18: 10 mg via INTRAVENOUS

## 2014-10-18 MED ORDER — 0.9 % SODIUM CHLORIDE (POUR BTL) OPTIME
TOPICAL | Status: DC | PRN
  Administered 2014-10-18: 1000 mL

## 2014-10-18 MED ORDER — ONDANSETRON HCL 4 MG/2ML IJ SOLN
INTRAMUSCULAR | Status: DC | PRN
  Administered 2014-10-18: 4 mg via INTRAVENOUS

## 2014-10-18 SURGICAL SUPPLY — 63 items
BANDAGE ELASTIC 3 VELCRO ST LF (GAUZE/BANDAGES/DRESSINGS) ×4 IMPLANT
BANDAGE ELASTIC 4 VELCRO ST LF (GAUZE/BANDAGES/DRESSINGS) ×4 IMPLANT
BIT DRILL 2.2 SS TIBIAL (BIT) ×2 IMPLANT
BLADE SURG ROTATE 9660 (MISCELLANEOUS) IMPLANT
BNDG CMPR 9X4 STRL LF SNTH (GAUZE/BANDAGES/DRESSINGS) ×2
BNDG ESMARK 4X9 LF (GAUZE/BANDAGES/DRESSINGS) ×4 IMPLANT
BNDG GAUZE ELAST 4 BULKY (GAUZE/BANDAGES/DRESSINGS) ×4 IMPLANT
CLOSURE WOUND 1/2 X4 (GAUZE/BANDAGES/DRESSINGS) ×1
COVER SURGICAL LIGHT HANDLE (MISCELLANEOUS) ×4 IMPLANT
CUFF TOURNIQUET SINGLE 18IN (TOURNIQUET CUFF) ×4 IMPLANT
CUFF TOURNIQUET SINGLE 24IN (TOURNIQUET CUFF) IMPLANT
DRAPE OEC MINIVIEW 54X84 (DRAPES) ×3 IMPLANT
DRAPE U-SHAPE 47X51 STRL (DRAPES) ×4 IMPLANT
ELECT REM PT RETURN 9FT ADLT (ELECTROSURGICAL)
ELECTRODE REM PT RTRN 9FT ADLT (ELECTROSURGICAL) IMPLANT
GAUZE SPONGE 4X4 12PLY STRL (GAUZE/BANDAGES/DRESSINGS) ×4 IMPLANT
GAUZE XEROFORM 5X9 LF (GAUZE/BANDAGES/DRESSINGS) ×4 IMPLANT
GLOVE BIO SURGEON STRL SZ7.5 (GLOVE) ×12 IMPLANT
GLOVE BIOGEL PI IND STRL 7.0 (GLOVE) IMPLANT
GLOVE BIOGEL PI IND STRL 8 (GLOVE) ×4 IMPLANT
GLOVE BIOGEL PI INDICATOR 7.0 (GLOVE) ×2
GLOVE BIOGEL PI INDICATOR 8 (GLOVE) ×4
GLOVE SURG SS PI 7.0 STRL IVOR (GLOVE) ×2 IMPLANT
GOWN STRL REUS W/ TWL LRG LVL3 (GOWN DISPOSABLE) ×2 IMPLANT
GOWN STRL REUS W/ TWL XL LVL3 (GOWN DISPOSABLE) ×4 IMPLANT
GOWN STRL REUS W/TWL LRG LVL3 (GOWN DISPOSABLE) ×4
GOWN STRL REUS W/TWL XL LVL3 (GOWN DISPOSABLE) ×8
K-WIRE 1.6 (WIRE) ×4
K-WIRE FX5X1.6XNS BN SS (WIRE) ×2
KIT BASIN OR (CUSTOM PROCEDURE TRAY) ×4 IMPLANT
KIT ROOM TURNOVER OR (KITS) ×4 IMPLANT
KWIRE FX5X1.6XNS BN SS (WIRE) IMPLANT
MANIFOLD NEPTUNE II (INSTRUMENTS) ×4 IMPLANT
NEEDLE 22X1 1/2 (OR ONLY) (NEEDLE) IMPLANT
NS IRRIG 1000ML POUR BTL (IV SOLUTION) ×4 IMPLANT
PACK ORTHO EXTREMITY (CUSTOM PROCEDURE TRAY) ×4 IMPLANT
PAD ARMBOARD 7.5X6 YLW CONV (MISCELLANEOUS) ×8 IMPLANT
PAD CAST 4YDX4 CTTN HI CHSV (CAST SUPPLIES) ×2 IMPLANT
PADDING CAST COTTON 4X4 STRL (CAST SUPPLIES) ×4
PEG LOCKING SMOOTH 2.2X16 (Screw) ×2 IMPLANT
PEG LOCKING SMOOTH 2.2X18 (Peg) ×6 IMPLANT
PEG LOCKING SMOOTH 2.2X20 (Screw) ×9 IMPLANT
PEG LOCKING SMOOTH 2.2X22 (Screw) ×3 IMPLANT
PLATE DVR CROSSLOCK STD RT (Plate) ×2 IMPLANT
SCREW 2.7X12MM (Screw) ×6 IMPLANT
SCREW 2.7X14MM (Screw) ×2 IMPLANT
SCREW BN 14X2.7XNONLOCK 3 LD (Screw) IMPLANT
SCREW NLOCK 2.7X14 (Screw) ×2 IMPLANT
SLING ARM FOAM STRAP LRG (SOFTGOODS) ×2 IMPLANT
SPLINT FIBERGLASS 4X15 (CAST SUPPLIES) ×2 IMPLANT
SPONGE GAUZE 4X4 12PLY STER LF (GAUZE/BANDAGES/DRESSINGS) ×2 IMPLANT
SPONGE LAP 4X18 X RAY DECT (DISPOSABLE) ×4 IMPLANT
STRIP CLOSURE SKIN 1/2X4 (GAUZE/BANDAGES/DRESSINGS) ×1 IMPLANT
SUCTION FRAZIER TIP 10 FR DISP (SUCTIONS) ×4 IMPLANT
SUT ETHILON 4 0 PS 2 18 (SUTURE) ×4 IMPLANT
SUT MNCRL AB 4-0 PS2 18 (SUTURE) ×3 IMPLANT
SUT VIC AB 4-0 PS2 27 (SUTURE) ×2 IMPLANT
SYR CONTROL 10ML LL (SYRINGE) IMPLANT
TOWEL OR 17X24 6PK STRL BLUE (TOWEL DISPOSABLE) ×4 IMPLANT
TOWEL OR 17X26 10 PK STRL BLUE (TOWEL DISPOSABLE) ×4 IMPLANT
TUBE CONNECTING 12'X1/4 (SUCTIONS) ×1
TUBE CONNECTING 12X1/4 (SUCTIONS) ×3 IMPLANT
WATER STERILE IRR 1000ML POUR (IV SOLUTION) ×4 IMPLANT

## 2014-10-18 NOTE — Brief Op Note (Signed)
10/18/2014  3:58 PM  PATIENT:  Angelica Kelley  69 y.o. female  PRE-OPERATIVE DIAGNOSIS:  RIGHT DISTAL RADIUS FRACTURE  POST-OPERATIVE DIAGNOSIS:  RIGHT DISTAL RADIUS FRACTURE  PROCEDURE:  Procedure(s): OPEN REDUCTION INTERNAL FIXATION (ORIF) RIGHT WRIST FRACTURE (Right) CARPAL TUNNEL RELEASE (Right)  SURGEON:  Surgeon(s) and Role:    * Dorna Leitz, MD - Primary  PHYSICIAN ASSISTANT:   ASSISTANTS: bethune   ANESTHESIA:   general  EBL:  Total I/O In: 1000 [I.V.:1000] Out: -   BLOOD ADMINISTERED:none  DRAINS: none   LOCAL MEDICATIONS USED:  NONE  SPECIMEN:  No Specimen  DISPOSITION OF SPECIMEN:  N/A  COUNTS:  YES  TOURNIQUET:   Total Tourniquet Time Documented: Upper Arm (Right) - 66 minutes Total: Upper Arm (Right) - 66 minutes   DICTATION: .Other Dictation: Dictation Number [id number]  PLAN OF CARE: Discharge to home after PACU  PATIENT DISPOSITION:  PACU - hemodynamically stable.   Delay start of Pharmacological VTE agent (>24hrs) due to surgical blood loss or risk of bleeding: no

## 2014-10-18 NOTE — Transfer of Care (Signed)
Immediate Anesthesia Transfer of Care Note  Patient: Angelica Kelley  Procedure(s) Performed: Procedure(s): OPEN REDUCTION INTERNAL FIXATION (ORIF) RIGHT WRIST FRACTURE (Right) CARPAL TUNNEL RELEASE (Right)  Patient Location: PACU  Anesthesia Type:General  Level of Consciousness: awake, alert  and oriented  Airway & Oxygen Therapy: Patient Spontanous Breathing and Patient connected to face mask oxygen  Post-op Assessment: Report given to RN and Post -op Vital signs reviewed and stable  Post vital signs: Reviewed and stable  Last Vitals:  Filed Vitals:   10/18/14 1046  BP: 140/90  Pulse: 57  Temp: 36.8 C  Resp: 18    Complications: No apparent anesthesia complications

## 2014-10-18 NOTE — Anesthesia Procedure Notes (Addendum)
Anesthesia Regional Block:  Axillary brachial plexus block  Pre-Anesthetic Checklist: ,, timeout performed, Correct Patient, Correct Site, Correct Laterality, Correct Procedure, Correct Position, site marked, Risks and benefits discussed,  Surgical consent,  Pre-op evaluation,  At surgeon's request and post-op pain management  Laterality: Right and Upper  Prep: chloraprep       Needles:  Injection technique: Single-shot  Needle Type: Other   (short "B" bevel)    Needle Gauge: 22 and 22 G    Additional Needles:  Procedures: paresthesia technique Axillary brachial plexus block  Nerve Stimulator or Paresthesia:  Response: transient median nerve paresthesia,  Response: transient ulnar nerve paresthesia,   Additional Responses:   Narrative:  Start time: 10/18/2014 11:57 AM End time: 10/18/2014 12:02 PM Injection made incrementally with aspirations every 5 mL.  Performed by: Personally  Anesthesiologist: Glennon Mac, [place]  Additional Notes: Pt identified in Holding room.  Monitors applied. Working IV access confirmed. Sterile prep R axilla.  #22ga short "B" bevel to transient median nerve and ulnar nerve paresthesiae.  Total 25cc 1.5% lidocaine with epi and 30cc 0.5% Bupivacaine with 1:200k epi injected incrementally around each paresthesia, after negative test doses  Patient asymptomatic, VSS, no heme aspirated, tolerated well.  Jenita Seashore, MD   Procedure Name: LMA Insertion Date/Time: 10/18/2014 12:57 PM Performed by: Clearnce Sorrel Pre-anesthesia Checklist: Patient identified, Timeout performed, Emergency Drugs available, Suction available and Patient being monitored Patient Re-evaluated:Patient Re-evaluated prior to inductionOxygen Delivery Method: Circle system utilized Preoxygenation: Pre-oxygenation with 100% oxygen Intubation Type: IV induction LMA: LMA inserted LMA Size: 4.0 Number of attempts: 1 Placement Confirmation: positive ETCO2 and breath sounds checked- equal and  bilateral Tube secured with: Tape Dental Injury: Teeth and Oropharynx as per pre-operative assessment

## 2014-10-18 NOTE — H&P (Signed)
PREOPERATIVE H&P  Chief Complaint: Right wrist pain  HPI: Angelica Kelley is a 69 y.o. female who presents for evaluation of Right wrist pain. It has been present for 3 days and has been worsening. She has failed conservative measures. Pain is rated as moderate.The patient slipped and fell suffering a distal radius intra-articular fracture.  She been treated on the opposite side with cast treatment hadn't been a very difficult situation for her.  Given that this is her dominant side arm she felt that she needed something to minimize casting given that she has to take care of herself.  She has been having some numbness and tingling of her fingers simultaneously.  Past Medical History  Diagnosis Date  . Hypertension   . Hyperlipidemia   . GERD (gastroesophageal reflux disease)   . Anxiety   . Depression   . Alcoholism   . IBS (irritable bowel syndrome)   . Elevated hemoglobin A1c   . Fracture of right wrist   . Heart murmur    Past Surgical History  Procedure Laterality Date  . Neck surgery  Remote     Ruptured disc, per patient. Got infected.   . Abdominal hysterectomy  1991  . Breast surgery Left 1989    [facility]  . Tonsillectomy  1965  . Cervical laminectomy  1977  . Appendectomy    . Tubal ligation     History   Social History  . Marital Status: Divorced    Spouse Name: N/A  . Number of Children: N/A  . Years of Education: N/A   Social History Main Topics  . Smoking status: Never Smoker   . Smokeless tobacco: Never Used  . Alcohol Use: No  . Drug Use: No  . Sexual Activity: No   Other Topics Concern  . None   Social History Narrative   Pt's niece, phone number [phone]. Pt lives alone at home, and doesn't use a cane or walker, is still pretty active. Never smoker.    Family History  Problem Relation Age of Onset  . Heart disease Mother   . Diabetes Mother   . Leukemia Father   . Heart disease Brother   . Cancer Brother   . Parkinson's disease Brother     Allergies  Allergen Reactions  . Lipitor [Atorvastatin]     Fatigue  . Prednisone Other (See Comments)    Change in mental status   Prior to Admission medications   Medication Sig Start Date End Date Taking? Authorizing Provider  ALPRAZolam Duanne Moron) 1 MG tablet Take 1 mg by mouth 3 (three) times daily as needed. For anxiety.   Yes Historical Provider, MD  aspirin EC 81 MG tablet Take 81 mg by mouth daily.   Yes Historical Provider, MD  bisoprolol-hydrochlorothiazide [facility]) 5-6.25 MG per tablet take 1 tablet by mouth once daily for blood pressure AND FLUID 05/31/14  Yes Unk Pinto, MD  buPROPion (WELLBUTRIN XL) 150 MG 24 hr tablet Take 450 mg by mouth daily.   Yes Historical Provider, MD  Cholecalciferol (VITAMIN D3) 5000 UNITS CAPS Take 5,000 Units by mouth daily.    Yes Historical Provider, MD  esomeprazole (NEXIUM) 40 MG capsule take 1 capsule by mouth once daily AT NOON 09/29/14  Yes Unk Pinto, MD  gabapentin (NEURONTIN) 300 MG capsule take 1 to 2 capsules by mouth at bedtime 03/22/14  Yes Vicie Mutters, PA-C  hydrochlorothiazide (MICROZIDE) 12.5 MG capsule take 1 capsule by mouth once daily for BLOOD PRESSURE / FLUID.  03/18/14  Yes Unk Pinto, MD  Iron-DSS-B12-FA-C-E-Cu-Biotin (HEMAX) 150-1 MG TABS Take 150 mg by mouth daily.   Yes Historical Provider, MD  lamoTRIgine (LAMICTAL) 25 MG tablet Take 1 tablet 2 x daily 10/27/13  Yes Unk Pinto, MD  rosuvastatin (CRESTOR) 40 MG tablet take 1/2 to 1 tablet by mouth once daily as directed for cholesterol 08/09/14 03/11/15 Yes Unk Pinto, MD  solifenacin (VESICARE) 5 MG tablet Take 1 tablet (5 mg total) by mouth daily. 06/09/14   Vicie Mutters, PA-C     Positive ROS: None  All other systems have been reviewed and were otherwise negative with the exception of those mentioned in the HPI and as above.  Physical Exam: Filed Vitals:   10/18/14 1046  BP: 140/90  Pulse: 57  Temp: 98.3 F (36.8 C)  Resp: 18     General: Alert, no acute distress Cardiovascular: No pedal edema Respiratory: No cyanosis, no use of accessory musculature GI: No organomegaly, abdomen is soft and non-tender Skin: No lesions in the area of chief complaint Neurologic: Sensation intact distally Psychiatric: Patient is competent for consent with normal mood and affect Lymphatic: No axillary or cervical lymphadenopathy  MUSCULOSKELETAL: Her right wrist tender to palpation with all range of motion.  Numbness and tingling in the thumb index and long fingers.  No decreased grip strength.  X-ray: X-rays of the wrist show distal radius intra-articular fracture with mild displacement.  Assessment/Plan: RIGHT DISTAL RADIUS FRACTUREWith intra-articular extension and displacement.  Moderate carpal tunnel symptoms. Plan for Procedure(s): OPEN REDUCTION INTERNAL FIXATION (ORIF) RIGHT WRIST FRACTURE.  Simultaneous carpal I'll release will be performed.  The risks benefits and alternatives were discussed with the patient including but not limited to the risks of nonoperative treatment, versus surgical intervention including infection, bleeding, nerve injury, malunion, nonunion, hardware prominence, hardware failure, need for hardware removal, blood clots, cardiopulmonary complications, morbidity, mortality, among others, and they were willing to proceed.  Predicted outcome is good, although there will be at least a six to nine month expected recovery.  Alta Corning, MD 10/18/2014 12:16 PM

## 2014-10-18 NOTE — Progress Notes (Signed)
Patient arrived for surgery, dropped off by her neighbor.  Her niece, Jilda Panda [phone] is not present and will not be here until after 12:30pm Ms Hark has brought a med size brown purse, that per her, has her credit cards ("a bunch"), gold colored necklace and gold colored ring that was removed from right hand and also placed in purse.  She also has a contact in her left eye and will offer a container to put it in.   She is wanting the whole purse to go to Security, but has since just wanted the wallet to go.  I asked her again if she wanted the ring and necklace placed in [place] folder, but she shook her head "not".Lilian Kapur NT, and myself counted the cards and items and placed black wallet into security envelope.  Lilian Kapur took the item to security @ 11:19am.

## 2014-10-18 NOTE — Anesthesia Postprocedure Evaluation (Signed)
  Anesthesia Post-op Note  Patient: Angelica Kelley  Procedure(s) Performed: Procedure(s): OPEN REDUCTION INTERNAL FIXATION (ORIF) RIGHT WRIST FRACTURE (Right) CARPAL TUNNEL RELEASE (Right)  Patient Location: PACU  Anesthesia Type:General and block  Level of Consciousness: awake and alert   Airway and Oxygen Therapy: Patient Spontanous Breathing  Post-op Pain: none  Post-op Assessment: Post-op Vital signs reviewed, Patient's Cardiovascular Status Stable and Respiratory Function Stable  Post-op Vital Signs: Reviewed  Filed Vitals:   10/18/14 1500  BP: 114/44  Pulse: 68  Temp:   Resp: 18    Complications: No apparent anesthesia complications

## 2014-10-18 NOTE — Discharge Instructions (Signed)
Elevate your right wrist as much as possible. Apply ice for 3 or 4 days most of the time. You may you move your fingers as tolerated. Use your pain medication as directed.

## 2014-10-18 NOTE — Anesthesia Preprocedure Evaluation (Addendum)
Anesthesia Evaluation  Patient identified by MRN, date of birth, ID band Patient awake    Reviewed: Allergy & Precautions, NPO status , Patient's Chart, lab work & pertinent test results, reviewed documented beta blocker date and time   History of Anesthesia Complications Negative for: history of anesthetic complications  Airway Mallampati: II  TM Distance: >3 FB Neck ROM: Full    Dental  (+) Dental Advisory Given, Missing   Pulmonary former smoker,  breath sounds clear to auscultation        Cardiovascular hypertension, Pt. on medications and Pt. on home beta blockers - angina+ Valvular Problems/Murmurs (mild by ECHO) AS Rhythm:Regular Rate:Normal  '13 ECHO: EF 60-65%, mild AS   Neuro/Psych Anxiety Depression negative neurological ROS     GI/Hepatic Neg liver ROS, GERD-  Medicated and Controlled,  Endo/Other  negative endocrine ROS  Renal/GU negative Renal ROS     Musculoskeletal   Abdominal   Peds  Hematology negative hematology ROS (+)   Anesthesia Other Findings   Reproductive/Obstetrics                           Anesthesia Physical Anesthesia Plan  ASA: II  Anesthesia Plan: General   Post-op Pain Management:    Induction: Intravenous  Airway Management Planned: LMA  Additional Equipment:   Intra-op Plan:   Post-operative Plan:   Informed Consent: I have reviewed the patients History and Physical, chart, labs and discussed the procedure including the risks, benefits and alternatives for the proposed anesthesia with the patient or authorized representative who has indicated his/her understanding and acceptance.   Dental advisory given  Plan Discussed with: CRNA and Surgeon  Anesthesia Plan Comments: (Plan routine monitors, GA- LMA O, axillary block for post op analgesia)        Anesthesia Quick Evaluation

## 2014-10-19 ENCOUNTER — Encounter (HOSPITAL_COMMUNITY): Payer: Self-pay | Admitting: Orthopedic Surgery

## 2014-10-19 NOTE — Op Note (Signed)
NAME:  [facility], [place]                 ACCOUNT NO.:  [id number]  MEDICAL RECORD NO.:  [id number]  LOCATION:  [facility]                         FACILITY:  [facility]  PHYSICIAN:  Alta Corning, M.D.   DATE OF BIRTH:  April 17, 1946  DATE OF PROCEDURE:  10/18/2014 DATE OF DISCHARGE:  10/18/2014                              OPERATIVE REPORT   PREOPERATIVE DIAGNOSES: 1. Distal radius intraarticular fracture. 2. Carpal tunnel syndrome.  POSTOPERATIVE DIAGNOSES: 1. Distal radius intraarticular fracture. 2. Carpal tunnel syndrome.  PROCEDURES: 1. Open reduction and internal fixation of distal radius fracture with     fixation of greater than 3 articular fragments. 2. Carpal tunnel release. 3. Interpretation of multiple intraoperative fluoroscopic images.  SURGEON:  Alta Corning, M.D.  ASSISTANT:  Gary Fleet, P.A.  ANESTHESIA:  General.  BRIEF HISTORY:  Mrs. Matuska is a 70 year old female with a history of significant complaints of distal radial pain after a fall.  X-ray showed distal radius comminuted intra-articular fracture.  She was having some numbness and tingling in her thumb, index, and long fingers at the time of presentation.  We talked about treatment options.  It was felt that she needed some kind of manipulative closed reduction.  She had a fracture on the other side that had been treated nonoperatively and ultimately went onto malunion.  Given this was her dominant side arm and she was having this numbness and tingling, we felt that operative intervention was appropriate.  She was taken to the operating room for this  procedure.  DESCRIPTION OF PROCEDURE:  The patient was brought to the operating room.  After the adequate anesthesia was obtained with general anesthetic, the patient was placed supine on the operating table.  The right arm was then prepped and draped in usual sterile fashion. Following this, the arm was exsanguinated and blood pressure tourniquet was inflated  to 250 mmHg.  Following this, a straight incision was made over the flexor carpi radialis tendon subcutaneous tissue down the level of the FCR tendon.  The FCR tendon was identified and retracted radially.  The floor of the sheath was exploited and we went down onto the distal radius.  The pronator quadratus was divided on its radial aspect and retracted and the fracture was identified and manipulative closed reduction was undertaken.  The plate was put in place and then provisionally stabilized.  We took AP and lateral fluoro to make sure they were in adequate position and seeing this, we put a pilot hole in the central slotted hole of the plate and again checked under fluoro to make sure that we had the bone reduced and that we are in adequate position with the plate and when these things were felt to be adequate, the distal pegs were placed followed by 2 proximal screws.  Final images were taken here and she had a very nicely reduced distal radius fracture at this point.  Because of her preoperative symptomatology, we dissected over just ulnar to the FCR tendon.  We were able to identify the median nerve at that point with a Soil scientist.  Over the median nerve, I identified the volar carpal fascia and then with retractors  in place, we released the volar carpal fascia underneath the skin, giving a very nice carpal tunnel release.  At this point, the wound was irrigated copiously, suctioned dry.  The pronator quadratus was repaired to the radial side with 4-0 Vicryl interrupted and then the skin was closed with 0 and 2-0 Vicryl and 4-0 Monocryl subcuticular.  Benzoin and Steri- Strips were applied.  Sterile compressive dressing was applied and the patient was taken to the recovery, she was noted to be in satisfactory condition.  Estimated blood loss for the procedure was none.     Alta Corning, M.D.     Corliss Skains  D:  10/18/2014  T:  10/19/2014  Job:  [id number]  cc:   Alta Corning, M.D.

## 2014-10-28 DIAGNOSIS — Z9889 Other specified postprocedural states: Secondary | ICD-10-CM | POA: Diagnosis not present

## 2014-11-11 ENCOUNTER — Ambulatory Visit: Payer: Self-pay | Admitting: Internal Medicine

## 2014-11-12 DIAGNOSIS — Z9889 Other specified postprocedural states: Secondary | ICD-10-CM | POA: Diagnosis not present

## 2014-11-22 DIAGNOSIS — Z780 Asymptomatic menopausal state: Secondary | ICD-10-CM | POA: Diagnosis not present

## 2014-11-22 DIAGNOSIS — Z13820 Encounter for screening for osteoporosis: Secondary | ICD-10-CM | POA: Diagnosis not present

## 2014-11-30 ENCOUNTER — Ambulatory Visit (INDEPENDENT_AMBULATORY_CARE_PROVIDER_SITE_OTHER): Payer: Medicare Other | Admitting: Physician Assistant

## 2014-11-30 ENCOUNTER — Encounter: Payer: Self-pay | Admitting: Internal Medicine

## 2014-11-30 VITALS — BP 114/64 | HR 60 | Temp 97.5°F | Resp 16 | Ht 65.0 in | Wt 150.2 lb

## 2014-11-30 DIAGNOSIS — R7309 Other abnormal glucose: Secondary | ICD-10-CM

## 2014-11-30 DIAGNOSIS — E559 Vitamin D deficiency, unspecified: Secondary | ICD-10-CM | POA: Diagnosis not present

## 2014-11-30 DIAGNOSIS — E785 Hyperlipidemia, unspecified: Secondary | ICD-10-CM | POA: Diagnosis not present

## 2014-11-30 DIAGNOSIS — N3281 Overactive bladder: Secondary | ICD-10-CM

## 2014-11-30 DIAGNOSIS — Z79899 Other long term (current) drug therapy: Secondary | ICD-10-CM | POA: Diagnosis not present

## 2014-11-30 DIAGNOSIS — F329 Major depressive disorder, single episode, unspecified: Secondary | ICD-10-CM

## 2014-11-30 DIAGNOSIS — I10 Essential (primary) hypertension: Secondary | ICD-10-CM | POA: Diagnosis not present

## 2014-11-30 DIAGNOSIS — F32A Depression, unspecified: Secondary | ICD-10-CM

## 2014-11-30 LAB — CBC WITH DIFFERENTIAL/PLATELET
BASOS ABS: 0.1 10*3/uL (ref 0.0–0.1)
BASOS PCT: 1 % (ref 0–1)
EOS PCT: 2 % (ref 0–5)
Eosinophils Absolute: 0.1 10*3/uL (ref 0.0–0.7)
HCT: 43.8 % (ref 36.0–46.0)
Hemoglobin: 14.5 g/dL (ref 12.0–15.0)
Lymphocytes Relative: 28 % (ref 12–46)
Lymphs Abs: 1.5 10*3/uL (ref 0.7–4.0)
MCH: 29.9 pg (ref 26.0–34.0)
MCHC: 33.1 g/dL (ref 30.0–36.0)
MCV: 90.3 fL (ref 78.0–100.0)
MONOS PCT: 8 % (ref 3–12)
MPV: 12 fL (ref 8.6–12.4)
Monocytes Absolute: 0.4 10*3/uL (ref 0.1–1.0)
Neutro Abs: 3.4 10*3/uL (ref 1.7–7.7)
Neutrophils Relative %: 61 % (ref 43–77)
PLATELETS: 218 10*3/uL (ref 150–400)
RBC: 4.85 MIL/uL (ref 3.87–5.11)
RDW: 12.8 % (ref 11.5–15.5)
WBC: 5.5 10*3/uL (ref 4.0–10.5)

## 2014-11-30 LAB — HEMOGLOBIN A1C
HEMOGLOBIN A1C: 5.5 % (ref <5.7)
MEAN PLASMA GLUCOSE: 111 mg/dL (ref <117)

## 2014-11-30 NOTE — Patient Instructions (Addendum)
Please monitor your blood pressure, as we get older our body can not respond to a low blood pressure as well as it did when we were younger, for this reason we want a bit higher of a blood pressure as you get older to avoid dizziness and fatigue which can lead to falls. Pease call if your blood pressure is consistently above 150/90. If you get dizzy can cut ziac in half.   Please monitor your blood pressure. If it is getting below 130/80 AND you are having fatigue with exertion, dizziness we may need to cut your blood pressure medication in half. Please call the office if this is happening. Hypotension As your heart beats, it forces blood through your body. This force is called blood pressure. If you have hypotension, you have low blood pressure. When your blood pressure is too low, you may not get enough blood to your brain. You may feel weak, feel lightheaded, have a fast heartbeat, or even pass out (faint). HOME CARE  Drink enough fluids to keep your pee (urine) clear or pale yellow.  Take all medicines as told by your doctor.  Get up slowly after sitting or lying down.  Wear support stockings as told by your doctor.  Maintain a healthy diet by including foods such as fruits, vegetables, nuts, whole grains, and lean meats. GET HELP IF:  You are throwing up (vomiting) or have watery poop (diarrhea).  You have a fever for more than 2-3 days.  You feel more thirsty than usual.  You feel weak and tired. GET HELP RIGHT AWAY IF:   You pass out (faint).  You have chest pain or a fast or irregular heartbeat.  You lose feeling in part of your body.  You cannot move your arms or legs.  You have trouble speaking.  You get sweaty or feel lightheaded. MAKE SURE YOU:   Understand these instructions.  Will watch your condition.  Will get help right away if you are not doing well or get worse. Document Released: 07/25/2009 Document Revised: 12/31/2012 Document Reviewed:  10/31/2012 [facility] Patient Information 2015 [place], [place]. This information is not intended to replace advice given to you by your health care provider. Make sure you discuss any questions you have with your health care provider.   Vitamin D goal is between 60-80  Please make sure that you are taking your Vitamin D as directed.   It is very important as a natural anti-inflammatory   helping hair, skin, and nails, as well as reducing stroke and heart attack risk.   It helps your bones and helps with mood.  It also decreases numerous cancer risks so please take it as directed.   Low Vit D is associated with a 200-300% higher risk for CANCER   and 200-300% higher risk for HEART   ATTACK  &  STROKE.    .....................................Marland Kitchen  It is also associated with higher death rate at younger ages,   autoimmune diseases like Rheumatoid arthritis, Lupus, Multiple Sclerosis.     Also many other serious conditions, like depression, Alzheimer's  Dementia, infertility, muscle aches, fatigue, fibromyalgia - just to name a few.  +++++++++++++++++++

## 2014-11-30 NOTE — Progress Notes (Addendum)
Assessment and Plan:  1. Hypertension -Continue medication, monitor blood pressure at home. Continue DASH diet.  Reminder to go to the ER if any CP, SOB, nausea, dizziness, severe HA, changes vision/speech, left arm numbness and tingling and jaw pain.  2. Cholesterol -Continue diet and exercise. Check cholesterol.   3. Prediabetes  -Continue diet and exercise. Check A1C  4. Vitamin D Def - check level and continue medications.   5. Depression Depression- continue medications, stress management techniques discussed, increase water, good sleep hygiene discussed, increase exercise, and increase veggies.   6. OAB - has failed medications, declines PT/urology.   7. S/p ORIF of right wrist Got DEXA at home with [facility], need results.   Continue diet and meds as discussed. Further disposition pending results of labs. Over 30 minutes of exam, counseling, chart review, and critical decision making was performed  DR. Melford Aase WAS PRESENT FOR EXAM  HPI 69 y.o. female  presents for 3 month follow up on hypertension, cholesterol, prediabetes, and vitamin D deficiency.   Her blood pressure has been controlled at home, today their BP is BP: 114/64 mmHg  She does not workout. She denies chest pain, shortness of breath, dizziness.  She is on cholesterol medication, she is on crestor 40 and denies myalgias. Her cholesterol is at goal. The cholesterol last visit was:   Lab Results  Component Value Date   CHOL 181 08/05/2014   HDL 51 08/05/2014   LDLCALC 105* 08/05/2014   TRIG 126 08/05/2014   CHOLHDL 3.5 08/05/2014    She has been working on diet and exercise for prediabetes, and denies paresthesia of the feet, polydipsia, polyuria and visual disturbances. Last A1C in the office was:  Lab Results  Component Value Date   HGBA1C 5.6 04/29/2014  Patient is on Vitamin D supplement.   Lab Results  Component Value Date   VD25OH 60 08/05/2014   She states the vesicare, myrebetiq, Lisbeth Ply and have  not helped. Declines seeing a urologist.  She fell down a neighbors steps and had ORIF of right wrist due to fracture, she is right handed. She has some decreased use of right thumb due to the fracture, follows up with Dr. Berenice Primas on the 21st. Has some pain later in the day.  She states she had a DEXA with [facility] at her home, we do not have the results.  Increased stress, daughter is into drugs and her daughter is now in jail.   Current Medications:  Current Outpatient Prescriptions on File Prior to Visit  Medication Sig Dispense Refill  . ALPRAZolam (XANAX) 1 MG tablet Take 1 mg by mouth 3 (three) times daily as needed. For anxiety.    Marland Kitchen aspirin EC 81 MG tablet Take 81 mg by mouth daily.    . bisoprolol-hydrochlorothiazide (ZIAC) 5-6.25 MG per tablet take 1 tablet by mouth once daily for blood pressure AND FLUID 90 tablet 2  . buPROPion (WELLBUTRIN XL) 150 MG 24 hr tablet Take 450 mg by mouth daily.    . Cholecalciferol (VITAMIN D3) 5000 UNITS CAPS Take 5,000 Units by mouth daily.     Marland Kitchen esomeprazole (NEXIUM) 40 MG capsule take 1 capsule by mouth once daily AT NOON 30 capsule 3  . gabapentin (NEURONTIN) 300 MG capsule take 1 to 2 capsules by mouth at bedtime 60 capsule 2  . hydrochlorothiazide (MICROZIDE) 12.5 MG capsule take 1 capsule by mouth once daily for BLOOD PRESSURE / FLUID. 90 capsule PRN  . HYDROcodone-acetaminophen (NORCO) 10-325 MG per  tablet Take 1 tablet by mouth every 6 (six) hours as needed for severe pain. 40 tablet 0  . Iron-DSS-B12-FA-C-E-Cu-Biotin (HEMAX) 150-1 MG TABS Take 150 mg by mouth daily.    Marland Kitchen lamoTRIgine (LAMICTAL) 25 MG tablet Take 1 tablet 2 x daily 60 tablet 99  . rosuvastatin (CRESTOR) 40 MG tablet take 1/2 to 1 tablet by mouth once daily as directed for cholesterol 90 tablet 1   No current facility-administered medications on file prior to visit.   Medical History:  Past Medical History  Diagnosis Date  . Hypertension   . Hyperlipidemia   . GERD  (gastroesophageal reflux disease)   . Anxiety   . Depression   . Alcoholism   . IBS (irritable bowel syndrome)   . Elevated hemoglobin A1c   . Fracture of right wrist   . Heart murmur    Allergies:  Allergies  Allergen Reactions  . Lipitor [Atorvastatin]     Fatigue  . Prednisone Other (See Comments)    Change in mental status    Review of Systems:  Review of Systems  Constitutional: Negative.   HENT: Negative.   Eyes: Negative.   Respiratory: Negative.   Cardiovascular: Negative.   Gastrointestinal: Negative.   Genitourinary: Positive for urgency and frequency. Negative for dysuria, hematuria and flank pain.  Musculoskeletal: Positive for back pain and joint pain. Negative for myalgias, falls and neck pain.  Skin: Negative.   Neurological: Negative.   Endo/Heme/Allergies: Negative.   Psychiatric/Behavioral: Negative.     Family history- Review and unchanged Social history- Review and unchanged Physical Exam: BP 114/64 mmHg  Pulse 60  Temp(Src) 97.5 F (36.4 C)  Resp 16  Ht 5\' 5"  (1.651 m)  Wt 150 lb 3.2 oz (68.13 kg)  BMI 24.99 kg/m2 Wt Readings from Last 3 Encounters:  11/30/14 150 lb 3.2 oz (68.13 kg)  10/18/14 151 lb (68.493 kg)  08/05/14 157 lb (71.215 kg)   General Appearance: Well nourished, in no apparent distress. Eyes: PERRLA, EOMs, conjunctiva no swelling or erythema Sinuses: No Frontal/maxillary tenderness ENT/Mouth: Ext aud canals clear, TMs without erythema, bulging. No erythema, swelling, or exudate on post pharynx.  Tonsils not swollen or erythematous. Hearing normal.  Neck: Supple, thyroid normal.  Respiratory: Respiratory effort normal, BS equal bilaterally without rales, rhonchi, wheezing or stridor.  Cardio: RRR with systolic 3/6 murmur Brisk peripheral pulses with 1+ edema.  Abdomen: Soft, + BS,  Non tender, no guarding, rebound, hernias, masses. Lymphatics: Non tender without lymphadenopathy.  Musculoskeletal: Full ROM, 5/5 strength,  Normal gait, well healing vertical scar on right forearm, decreased ROM of right thumb with hypothenar atrophy.  Skin: Warm, dry without rashes, lesions, ecchymosis.  Neuro: Cranial nerves intact. Normal muscle tone, no cerebellar symptoms. Psych: Awake and oriented X 3, normal affect, Insight and Judgment appropriate.   Future Appointments Date Time Provider [place]  04/28/2015 3:45 PM Unk Pinto, MD [facility] None    Vicie Mutters, PA-C 12:20 PM [facility]

## 2014-12-01 LAB — LIPID PANEL
CHOL/HDL RATIO: 3.8 ratio
Cholesterol: 196 mg/dL (ref 0–200)
HDL: 51 mg/dL (ref >=46)
LDL CALC: 118 mg/dL — AB (ref 0–99)
TRIGLYCERIDES: 135 mg/dL (ref <150)
VLDL: 27 mg/dL (ref 0–40)

## 2014-12-01 LAB — BASIC METABOLIC PANEL WITH GFR
BUN: 18 mg/dL (ref 6–23)
CALCIUM: 10.3 mg/dL (ref 8.4–10.5)
CHLORIDE: 101 meq/L (ref 96–112)
CO2: 30 mEq/L (ref 19–32)
Creat: 0.92 mg/dL (ref 0.50–1.10)
GFR, Est African American: 72 mL/min
GFR, Est Non African American: 63 mL/min
GLUCOSE: 88 mg/dL (ref 70–99)
Potassium: 3.8 mEq/L (ref 3.5–5.3)
SODIUM: 142 meq/L (ref 135–145)

## 2014-12-01 LAB — HEPATIC FUNCTION PANEL
ALT: 13 U/L (ref 0–35)
AST: 18 U/L (ref 0–37)
Albumin: 4.5 g/dL (ref 3.5–5.2)
Alkaline Phosphatase: 62 U/L (ref 39–117)
Bilirubin, Direct: 0.1 mg/dL (ref 0.0–0.3)
Indirect Bilirubin: 0.5 mg/dL (ref 0.2–1.2)
TOTAL PROTEIN: 7.3 g/dL (ref 6.0–8.3)
Total Bilirubin: 0.6 mg/dL (ref 0.2–1.2)

## 2014-12-01 LAB — VITAMIN D 25 HYDROXY (VIT D DEFICIENCY, FRACTURES): Vit D, 25-Hydroxy: 69 ng/mL (ref 30–100)

## 2014-12-01 LAB — MAGNESIUM: Magnesium: 2.1 mg/dL (ref 1.5–2.5)

## 2014-12-01 LAB — TSH: TSH: 2.634 u[IU]/mL (ref 0.350–4.500)

## 2014-12-01 LAB — INSULIN, FASTING: Insulin fasting, serum: 3.2 u[IU]/mL (ref 2.0–19.6)

## 2014-12-06 DIAGNOSIS — Z9889 Other specified postprocedural states: Secondary | ICD-10-CM | POA: Diagnosis not present

## 2015-01-28 ENCOUNTER — Other Ambulatory Visit: Payer: Self-pay | Admitting: Internal Medicine

## 2015-03-02 ENCOUNTER — Other Ambulatory Visit: Payer: Self-pay | Admitting: Internal Medicine

## 2015-03-15 ENCOUNTER — Other Ambulatory Visit: Payer: Self-pay | Admitting: Physician Assistant

## 2015-03-24 ENCOUNTER — Other Ambulatory Visit: Payer: Self-pay | Admitting: *Deleted

## 2015-03-24 MED ORDER — HYDROCHLOROTHIAZIDE 12.5 MG PO CAPS: ORAL_CAPSULE | ORAL | Status: DC

## 2015-04-04 ENCOUNTER — Other Ambulatory Visit: Payer: Self-pay

## 2015-04-04 DIAGNOSIS — Z1231 Encounter for screening mammogram for malignant neoplasm of breast: Secondary | ICD-10-CM

## 2015-04-28 ENCOUNTER — Encounter: Payer: Self-pay | Admitting: Internal Medicine

## 2015-04-28 ENCOUNTER — Ambulatory Visit (INDEPENDENT_AMBULATORY_CARE_PROVIDER_SITE_OTHER): Payer: Medicare Other | Admitting: Internal Medicine

## 2015-04-28 VITALS — BP 106/62 | HR 56 | Temp 97.5°F | Resp 16 | Ht 64.5 in | Wt 154.4 lb

## 2015-04-28 DIAGNOSIS — E785 Hyperlipidemia, unspecified: Secondary | ICD-10-CM | POA: Diagnosis not present

## 2015-04-28 DIAGNOSIS — R7309 Other abnormal glucose: Secondary | ICD-10-CM

## 2015-04-28 DIAGNOSIS — Z79899 Other long term (current) drug therapy: Secondary | ICD-10-CM

## 2015-04-28 DIAGNOSIS — I10 Essential (primary) hypertension: Secondary | ICD-10-CM | POA: Diagnosis not present

## 2015-04-28 DIAGNOSIS — Z0001 Encounter for general adult medical examination with abnormal findings: Secondary | ICD-10-CM

## 2015-04-28 DIAGNOSIS — F329 Major depressive disorder, single episode, unspecified: Secondary | ICD-10-CM

## 2015-04-28 DIAGNOSIS — E559 Vitamin D deficiency, unspecified: Secondary | ICD-10-CM

## 2015-04-28 DIAGNOSIS — Z1212 Encounter for screening for malignant neoplasm of rectum: Secondary | ICD-10-CM

## 2015-04-28 DIAGNOSIS — Z0000 Encounter for general adult medical examination without abnormal findings: Secondary | ICD-10-CM

## 2015-04-28 DIAGNOSIS — F32A Depression, unspecified: Secondary | ICD-10-CM

## 2015-04-28 DIAGNOSIS — K219 Gastro-esophageal reflux disease without esophagitis: Secondary | ICD-10-CM

## 2015-04-28 NOTE — Patient Instructions (Signed)
Recommend Adult Low Dose Aspirin or   coated  Aspirin 81 mg daily   To reduce risk of Colon Cancer 20 %,   Skin Cancer 26 % ,   Melanoma 46%   and   Pancreatic cancer 60%   ++++++++++++++++++++++++++++++++++++++++++++++++++++++  Vitamin D goal   is between 70-100.   Please make sure that you are taking your Vitamin D as directed.   It is very important as a natural anti-inflammatory   helping hair, skin, and nails, as well as reducing stroke and heart attack risk.   It helps your bones and helps with mood.  It also decreases numerous cancer risks so please take it as directed.   Low Vit D is associated with a 200-300% higher risk for CANCER   and 200-300% higher risk for HEART   ATTACK  &  STROKE.   ......................................  It is also associated with higher death rate at younger ages,   autoimmune diseases like Rheumatoid arthritis, Lupus, Multiple Sclerosis.     Also many other serious conditions, like depression, Alzheimer's  Dementia, infertility, muscle aches, fatigue, fibromyalgia - just to name a few.  ++++++++++++++++++++++++++++++++++++++++++++++++  Recommend the book "The END of DIETING" by Dr Joel Fuhrman   & the book "The END of DIABETES " by Dr Joel Fuhrman  At [employer] - get book & Audio CD's     Being diabetic has a  300% increased risk for heart attack, stroke, cancer, and alzheimer- type vascular dementia. It is very important that you work harder with diet by avoiding all foods that are white. Avoid white rice (brown & wild rice is OK), white potatoes (sweetpotatoes in moderation is OK), White bread or wheat bread or anything made out of white flour like bagels, donuts, rolls, buns, biscuits, cakes, pastries, cookies, pizza crust, and pasta (made from white flour & egg whites) - vegetarian pasta or spinach or wheat pasta is OK. Multigrain breads like Arnold's or [employer], or multigrain sandwich thins or flatbreads.  Diet,  exercise and weight loss can reverse and cure diabetes in the early stages.  Diet, exercise and weight loss is very important in the control and prevention of complications of diabetes which affects every system in your body, ie. Brain - dementia/stroke, eyes - glaucoma/blindness, heart - heart attack/heart failure, kidneys - dialysis, stomach - gastric paralysis, intestines - malabsorption, nerves - severe painful neuritis, circulation - gangrene & loss of a leg(s), and finally cancer and Alzheimers.    I recommend avoid fried & greasy foods,  sweets/candy, white rice (brown or wild rice or Quinoa is OK), white potatoes (sweet potatoes are OK) - anything made from white flour - bagels, doughnuts, rolls, buns, biscuits,white and wheat breads, pizza crust and traditional pasta made of white flour & egg white(vegetarian pasta or spinach or wheat pasta is OK).  Multi-grain bread is OK - like multi-grain flat bread or sandwich thins. Avoid alcohol in excess. Exercise is also important.    Eat all the vegetables you want - avoid meat, especially red meat and dairy - especially cheese.  Cheese is the most concentrated form of trans-fats which is the worst thing to clog up our arteries. Veggie cheese is OK which can be found in the fresh produce section at [employer] or [employer] or [employer]  ++++++++++++++++++++++++++++++++++++++++++++++++++ DASH Eating Plan  DASH stands for "Dietary Approaches to Stop Hypertension."   The DASH eating plan is a healthy eating plan that has been shown to reduce high   blood pressure (hypertension). Additional health benefits may include reducing the risk of type 2 diabetes mellitus, heart disease, and stroke. The DASH eating plan may also help with weight loss.  WHAT DO I NEED TO KNOW ABOUT THE DASH EATING PLAN? For the DASH eating plan, you will follow these general guidelines:  Choose foods with a percent daily value for sodium of less than 5% (as listed on the food  label).  Use salt-free seasonings or herbs instead of table salt or sea salt.  Check with your health care provider or pharmacist before using salt substitutes.  Eat lower-sodium products, often labeled as "lower sodium" or "no salt added."  Eat fresh foods.  Eat more vegetables, fruits, and low-fat dairy products.    Choose whole grains. Look for the word "whole" as the first word in the ingredient list.  Choose fish   Limit sweets, desserts, sugars, and sugary drinks.  Choose heart-healthy fats.  Eat veggie cheese   Eat more home-cooked food and less restaurant, buffet, and fast food.  Limit fried foods.  Cook foods using methods other than frying.  Limit canned vegetables. If you do use them, rinse them well to decrease the sodium.  When eating at a restaurant, ask that your food be prepared with less salt, or no salt if possible.                      WHAT FOODS CAN I EAT?  Seek help from a dietitian for individual calorie needs.  Grains Whole grain or whole wheat bread. Brown rice. Whole grain or whole wheat pasta. Quinoa, bulgur, and whole grain cereals. Low-sodium cereals. Corn or whole wheat flour tortillas. Whole grain cornbread. Whole grain crackers. Low-sodium crackers.  Vegetables Fresh or frozen vegetables (raw, steamed, roasted, or grilled). Low-sodium or reduced-sodium tomato and vegetable juices. Low-sodium or reduced-sodium tomato sauce and paste. Low-sodium or reduced-sodium canned vegetables.   Fruits All fresh, canned (in natural juice), or frozen fruits.  Protein Products  All fish and seafood.  Dried beans, peas, or lentils. Unsalted nuts and seeds. Unsalted canned beans.  Dairy Low-fat dairy products, such as skim or 1% milk, 2% or reduced-fat cheeses, low-fat ricotta or cottage cheese, or plain low-fat yogurt. Low-sodium or reduced-sodium cheeses.  Fats and Oils Tub margarines without trans fats. Light or reduced-fat mayonnaise and salad  dressings (reduced sodium). Avocado. Safflower, olive, or canola oils. Natural peanut or almond butter.  Other Unsalted popcorn and pretzels. The items listed above may not be a complete list of recommended foods or beverages. Contact your dietitian for more options.  +++++++++++++++++++++++++++++++++++++++++++  WHAT FOODS ARE NOT RECOMMENDED?  Grains/ White flour or wheat flour  White bread. White pasta. White rice. Refined cornbread. Bagels and croissants. Crackers that contain trans fat.  Vegetables  Creamed or fried vegetables. Vegetables in a . Regular canned vegetables. Regular canned tomato sauce and paste. Regular tomato and vegetable juices.  Fruits Dried fruits. Canned fruit in light or heavy syrup. Fruit juice.  Meat and Other Protein Products Meat in general. Fatty cuts of meat. Ribs, chicken wings, bacon, sausage, bologna, salami, chitterlings, fatback, hot dogs, bratwurst, and packaged luncheon meats.  Dairy Whole or 2% milk, cream, half-and-half, and cream cheese. Whole-fat or sweetened yogurt. Full-fat cheeses or blue cheese. Nondairy creamers and whipped toppings. Processed cheese, cheese spreads, or cheese curds.  Condiments Onion and garlic salt, seasoned salt, table salt, and sea salt. Canned and packaged gravies. Worcestershire sauce. Tartar sauce.  Barbecue sauce. Teriyaki sauce. Soy sauce, including reduced sodium. Steak sauce. Fish sauce. Oyster sauce. Cocktail sauce. Horseradish. Ketchup and mustard. Meat flavorings and tenderizers. Bouillon cubes. Hot sauce. Tabasco sauce. Marinades. Taco seasonings. Relishes.  Fats and Oils Butter, stick margarine, lard, shortening, ghee, and bacon fat. Coconut, palm kernel, or palm oils. Regular salad dressings.  Pickles and olives. Salted popcorn and pretzels. The items listed above may not be a complete list of foods and beverages to avoid.  Preventive Care for Adults A healthy lifestyle and preventive care can  promote health and wellness. Preventive health guidelines for men include the following key practices:  A routine yearly physical is a good way to check with your health care provider about your health and preventative screening. It is a chance to share any concerns and updates on your health and to receive a thorough exam.  Visit your dentist for a routine exam and preventative care every 6 months. Brush your teeth twice a day and floss once a day. Good oral hygiene prevents tooth decay and gum disease.  The frequency of eye exams is based on your age, health, family medical history, use of contact lenses, and other factors. Follow your health care provider's recommendations for frequency of eye exams.  Eat a healthy diet. Foods such as vegetables, fruits, whole grains, low-fat dairy products, and lean protein foods contain the nutrients you need without too many calories. Decrease your intake of foods high in solid fats, added sugars, and salt. Eat the right amount of calories for you.Get information about a proper diet from your health care provider, if necessary.  Regular physical exercise is one of the most important things you can do for your health. Most adults should get at least 150 minutes of moderate-intensity exercise (any activity that increases your heart rate and causes you to sweat) each week. In addition, most adults need muscle-strengthening exercises on 2 or more days a week.  Maintain a healthy weight. The body mass index (BMI) is a screening tool to identify possible weight problems. It provides an estimate of body fat based on height and weight. Your health care provider can find your BMI and can help you achieve or maintain a healthy weight.For adults 20 years and older:  A BMI below 18.5 is considered underweight.  A BMI of 18.5 to 24.9 is normal.  A BMI of 25 to 29.9 is considered overweight.  A BMI of 30 and above is considered obese.  Maintain normal blood lipids  and cholesterol levels by exercising and minimizing your intake of saturated fat. Eat a balanced diet with plenty of fruit and vegetables. Blood tests for lipids and cholesterol should begin at age 20 and be repeated every 5 years. If your lipid or cholesterol levels are high, you are over 50, or you are at high risk for heart disease, you may need your cholesterol levels checked more frequently.Ongoing high lipid and cholesterol levels should be treated with medicines if diet and exercise are not working.  If you smoke, find out from your health care provider how to quit. If you do not use tobacco, do not start.  Lung cancer screening is recommended for adults aged 55-80 years who are at high risk for developing lung cancer because of a history of smoking. A yearly low-dose CT scan of the lungs is recommended for people who have at least a 30-pack-year history of smoking and are a current smoker or have quit within the past 15 years. A   pack year of smoking is smoking an average of 1 pack of cigarettes a day for 1 year (for example: 1 pack a day for 30 years or 2 packs a day for 15 years). Yearly screening should continue until the smoker has stopped smoking for at least 15 years. Yearly screening should be stopped for people who develop a health problem that would prevent them from having lung cancer treatment.  If you choose to drink alcohol, do not have more than 2 drinks per day. One drink is considered to be 12 ounces (355 mL) of beer, 5 ounces (148 mL) of wine, or 1.5 ounces (44 mL) of liquor.  Avoid use of street drugs. Do not share needles with anyone. Ask for help if you need support or instructions about stopping the use of drugs.  High blood pressure causes heart disease and increases the risk of stroke. Your blood pressure should be checked at least every 1-2 years. Ongoing high blood pressure should be treated with medicines, if weight loss and exercise are not effective.  If you are 60-66  years old, ask your health care provider if you should take aspirin to prevent heart disease.  Diabetes screening involves taking a blood sample to check your fasting blood sugar level. This should be done once every 3 years, after age 78, if you are within normal weight and without risk factors for diabetes. Testing should be considered at a younger age or be carried out more frequently if you are overweight and have at least 1 risk factor for diabetes.  Colorectal cancer can be detected and often prevented. Most routine colorectal cancer screening begins at the age of 33 and continues through age 32. However, your health care provider may recommend screening at an earlier age if you have risk factors for colon cancer. On a yearly basis, your health care provider may provide home test kits to check for hidden blood in the stool. Use of a small camera at the end of a tube to directly examine the colon (sigmoidoscopy or colonoscopy) can detect the earliest forms of colorectal cancer. Talk to your health care provider about this at age 37, when routine screening begins. Direct exam of the colon should be repeated every 5-10 years through age 49, unless early forms of precancerous polyps or small growths are found.  People who are at an increased risk for hepatitis B should be screened for this virus. You are considered at high risk for hepatitis B if:  You were born in a country where hepatitis B occurs often. Talk with your health care provider about which countries are considered high risk.  Your parents were born in a high-risk country and you have not received a shot to protect against hepatitis B (hepatitis B vaccine).  You have HIV or AIDS.  You use needles to inject street drugs.  You live with, or have sex with, someone who has hepatitis B.  You are a man who has sex with other men (MSM).  You get hemodialysis treatment.  You take certain medicines for conditions such as cancer, organ  transplantation, and autoimmune conditions.  Hepatitis C blood testing is recommended for all people born from [birth year] through 1967 and any individual with known risks for hepatitis C.  Practice safe sex. Use condoms and avoid high-risk sexual practices to reduce the spread of sexually transmitted infections (STIs). STIs include gonorrhea, chlamydia, syphilis, trichomonas, herpes, HPV, and human immunodeficiency virus (HIV). Herpes, HIV, and HPV are viral illnesses  that have no cure. They can result in disability, cancer, and death.  If you are at risk of being infected with HIV, it is recommended that you take a prescription medicine daily to prevent HIV infection. This is called preexposure prophylaxis (PrEP). You are considered at risk if:  You are a man who has sex with other men (MSM) and have other risk factors.  You are a heterosexual man, are sexually active, and are at increased risk for HIV infection.  You take drugs by injection.  You are sexually active with a partner who has HIV.  Talk with your health care provider about whether you are at high risk of being infected with HIV. If you choose to begin PrEP, you should first be tested for HIV. You should then be tested every 3 months for as long as you are taking PrEP.  A one-time screening for abdominal aortic aneurysm (AAA) and surgical repair of large AAAs by ultrasound are recommended for men ages 58 to 50 years who are current or former smokers.  Healthy men should no longer receive prostate-specific antigen (PSA) blood tests as part of routine cancer screening. Talk with your health care provider about prostate cancer screening.  Testicular cancer screening is not recommended for adult males who have no symptoms. Screening includes self-exam, a health care provider exam, and other screening tests. Consult with your health care provider about any symptoms you have or any concerns you have about testicular cancer.  Use sunscreen.  Apply sunscreen liberally and repeatedly throughout the day. You should seek shade when your shadow is shorter than you. Protect yourself by wearing long sleeves, pants, a wide-brimmed hat, and sunglasses year round, whenever you are outdoors.  Once a month, do a whole-body skin exam, using a mirror to look at the skin on your back. Tell your health care provider about new moles, moles that have irregular borders, moles that are larger than a pencil eraser, or moles that have changed in shape or color.  Stay current with required vaccines (immunizations).  Influenza vaccine. All adults should be immunized every year.  Tetanus, diphtheria, and acellular pertussis (Td, Tdap) vaccine. An adult who has not previously received Tdap or who does not know his vaccine status should receive 1 dose of Tdap. This initial dose should be followed by tetanus and diphtheria toxoids (Td) booster doses every 10 years. Adults with an unknown or incomplete history of completing a 3-dose immunization series with Td-containing vaccines should begin or complete a primary immunization series including a Tdap dose. Adults should receive a Td booster every 10 years.  Varicella vaccine. An adult without evidence of immunity to varicella should receive 2 doses or a second dose if he has previously received 1 dose.  Human papillomavirus (HPV) vaccine. Males aged 55-21 years who have not received the vaccine previously should receive the 3-dose series. Males aged 22-26 years may be immunized. Immunization is recommended through the age of 21 years for any female who has sex with males and did not get any or all doses earlier. Immunization is recommended for any person with an immunocompromised condition through the age of 56 years if he did not get any or all doses earlier. During the 3-dose series, the second dose should be obtained 4-8 weeks after the first dose. The third dose should be obtained 24 weeks after the first dose and 16  weeks after the second dose.  Zoster vaccine. One dose is recommended for adults aged 45 years  or older unless certain conditions are present.  Measles, mumps, and rubella (MMR) vaccine. Adults born before 1957 generally are considered immune to measles and mumps. Adults born in 1957 or later should have 1 or more doses of MMR vaccine unless there is a contraindication to the vaccine or there is laboratory evidence of immunity to each of the three diseases. A routine second dose of MMR vaccine should be obtained at least 28 days after the first dose for students attending postsecondary schools, health care workers, or international travelers. People who received inactivated measles vaccine or an unknown type of measles vaccine during 1963-1967 should receive 2 doses of MMR vaccine. People who received inactivated mumps vaccine or an unknown type of mumps vaccine before 1979 and are at high risk for mumps infection should consider immunization with 2 doses of MMR vaccine. Unvaccinated health care workers born before 1957 who lack laboratory evidence of measles, mumps, or rubella immunity or laboratory confirmation of disease should consider measles and mumps immunization with 2 doses of MMR vaccine or rubella immunization with 1 dose of MMR vaccine.  Pneumococcal 13-valent conjugate (PCV13) vaccine. When indicated, a person who is uncertain of his immunization history and has no record of immunization should receive the PCV13 vaccine. An adult aged 19 years or older who has certain medical conditions and has not been previously immunized should receive 1 dose of PCV13 vaccine. This PCV13 should be followed with a dose of pneumococcal polysaccharide (PPSV23) vaccine. The PPSV23 vaccine dose should be obtained at least 8 weeks after the dose of PCV13 vaccine. An adult aged 19 years or older who has certain medical conditions and previously received 1 or more doses of PPSV23 vaccine should receive 1 dose of PCV13.  The PCV13 vaccine dose should be obtained 1 or more years after the last PPSV23 vaccine dose.  Pneumococcal polysaccharide (PPSV23) vaccine. When PCV13 is also indicated, PCV13 should be obtained first. All adults aged 65 years and older should be immunized. An adult younger than age 65 years who has certain medical conditions should be immunized. Any person who resides in a nursing home or long-term care facility should be immunized. An adult smoker should be immunized. People with an immunocompromised condition and certain other conditions should receive both PCV13 and PPSV23 vaccines. People with human immunodeficiency virus (HIV) infection should be immunized as soon as possible after diagnosis. Immunization during chemotherapy or radiation therapy should be avoided. Routine use of PPSV23 vaccine is not recommended for American Indians, [place] Natives, or people younger than 65 years unless there are medical conditions that require PPSV23 vaccine. When indicated, people who have unknown immunization and have no record of immunization should receive PPSV23 vaccine. One-time revaccination 5 years after the first dose of PPSV23 is recommended for people aged 19-64 years who have chronic kidney failure, nephrotic syndrome, asplenia, or immunocompromised conditions. People who received 1-2 doses of PPSV23 before age 65 years should receive another dose of PPSV23 vaccine at age 65 years or later if at least 5 years have passed since the previous dose. Doses of PPSV23 are not needed for people immunized with PPSV23 at or after age 65 years.  Meningococcal vaccine. Adults with asplenia or persistent complement component deficiencies should receive 2 doses of quadrivalent meningococcal conjugate (MenACWY-D) vaccine. The doses should be obtained at least 2 months apart. Microbiologists working with certain meningococcal bacteria, military recruits, people at risk during an outbreak, and people who travel to or live in  countries with   a high rate of meningitis should be immunized. A first-year college student up through age 21 years who is living in a residence hall should receive a dose if he did not receive a dose on or after his 16th birthday. Adults who have certain high-risk conditions should receive one or more doses of vaccine.  Hepatitis A vaccine. Adults who wish to be protected from this disease, have certain high-risk conditions, work with hepatitis A-infected animals, work in hepatitis A research labs, or travel to or work in countries with a high rate of hepatitis A should be immunized. Adults who were previously unvaccinated and who anticipate close contact with an international adoptee during the first 60 days after arrival in the [place] from a country with a high rate of hepatitis A should be immunized.  Hepatitis B vaccine. Adults should be immunized if they wish to be protected from this disease, have certain high-risk conditions, may be exposed to blood or other infectious body fluids, are household contacts or sex partners of hepatitis B positive people, are clients or workers in certain care facilities, or travel to or work in countries with a high rate of hepatitis B.  Haemophilus influenzae type b (Hib) vaccine. A previously unvaccinated person with asplenia or sickle cell disease or having a scheduled splenectomy should receive 1 dose of Hib vaccine. Regardless of previous immunization, a recipient of a hematopoietic stem cell transplant should receive a 3-dose series 6-12 months after his successful transplant. Hib vaccine is not recommended for adults with HIV infection. Preventive Service / Frequency Ages 19 to 39  Blood pressure check.** / Every 1 to 2 years.  Lipid and cholesterol check.** / Every 5 years beginning at age 20.  Hepatitis C blood test.** / For any individual with known risks for hepatitis C.  Skin self-exam. / Monthly.  Influenza vaccine. / Every year.  Tetanus,  diphtheria, and acellular pertussis (Tdap, Td) vaccine.** / Consult your health care provider. 1 dose of Td every 10 years.  Varicella vaccine.** / Consult your health care provider.  HPV vaccine. / 3 doses over 6 months, if 26 or younger.  Measles, mumps, rubella (MMR) vaccine.** / You need at least 1 dose of MMR if you were born in 1957 or later. You may also need a second dose.  Pneumococcal 13-valent conjugate (PCV13) vaccine.** / Consult your health care provider.  Pneumococcal polysaccharide (PPSV23) vaccine.** / 1 to 2 doses if you smoke cigarettes or if you have certain conditions.  Meningococcal vaccine.** / 1 dose if you are age 19 to 21 years and a first-year college student living in a residence hall, or have one of several medical conditions. You may also need additional booster doses.  Hepatitis A vaccine.** / Consult your health care provider.  Hepatitis B vaccine.** / Consult your health care provider.  Haemophilus influenzae type b (Hib) vaccine.** / Consult your health care provider. Ages 40 to 64  Blood pressure check.** / Every 1 to 2 years.  Lipid and cholesterol check.** / Every 5 years beginning at age 20.  Lung cancer screening. / Every year if you are aged 55-80 years and have a 30-pack-year history of smoking and currently smoke or have quit within the past 15 years. Yearly screening is stopped once you have quit smoking for at least 15 years or develop a health problem that would prevent you from having lung cancer treatment.  Fecal occult blood test (FOBT) of stool. / Every year beginning at age 50   and continuing until age 75. You may not have to do this test if you get a colonoscopy every 10 years.  Flexible sigmoidoscopy** or colonoscopy.** / Every 5 years for a flexible sigmoidoscopy or every 10 years for a colonoscopy beginning at age 50 and continuing until age 75.  Hepatitis C blood test.** / For all people born from 1945 through 1965 and any  individual with known risks for hepatitis C.  Skin self-exam. / Monthly.  Influenza vaccine. / Every year.  Tetanus, diphtheria, and acellular pertussis (Tdap/Td) vaccine.** / Consult your health care provider. 1 dose of Td every 10 years.  Varicella vaccine.** / Consult your health care provider.  Zoster vaccine.** / 1 dose for adults aged 60 years or older.  Measles, mumps, rubella (MMR) vaccine.** / You need at least 1 dose of MMR if you were born in 1957 or later. You may also need a second dose.  Pneumococcal 13-valent conjugate (PCV13) vaccine.** / Consult your health care provider.  Pneumococcal polysaccharide (PPSV23) vaccine.** / 1 to 2 doses if you smoke cigarettes or if you have certain conditions.  Meningococcal vaccine.** / Consult your health care provider.  Hepatitis A vaccine.** / Consult your health care provider.  Hepatitis B vaccine.** / Consult your health care provider.  Haemophilus influenzae type b (Hib) vaccine.** / Consult your health care provider. Ages 65 and over  Blood pressure check.** / Every 1 to 2 years.  Lipid and cholesterol check.**/ Every 5 years beginning at age 20.  Lung cancer screening. / Every year if you are aged 55-80 years and have a 30-pack-year history of smoking and currently smoke or have quit within the past 15 years. Yearly screening is stopped once you have quit smoking for at least 15 years or develop a health problem that would prevent you from having lung cancer treatment.  Fecal occult blood test (FOBT) of stool. / Every year beginning at age 50 and continuing until age 75. You may not have to do this test if you get a colonoscopy every 10 years.  Flexible sigmoidoscopy** or colonoscopy.** / Every 5 years for a flexible sigmoidoscopy or every 10 years for a colonoscopy beginning at age 50 and continuing until age 75.  Hepatitis C blood test.** / For all people born from 1945 through 1965 and any individual with known risks  for hepatitis C.  Abdominal aortic aneurysm (AAA) screening./ Screening current or former smokers or have Hypertension.  Skin self-exam. / Monthly.  Influenza vaccine. / Every year.  Tetanus, diphtheria, and acellular pertussis (Tdap/Td) vaccine.** / 1 dose of Td every 10 years.  Varicella vaccine.** / Consult your health care provider.  Zoster vaccine.** / 1 dose for adults aged 60 years or older.  Pneumococcal 13-valent conjugate (PCV13) vaccine.** / Consult your health care provider.  Pneumococcal polysaccharide (PPSV23) vaccine.** / 1 dose for all adults aged 65 years and older.  Meningococcal vaccine.** / Consult your health care provider.  Hepatitis A vaccine.** / Consult your health care provider.  Hepatitis B vaccine.** / Consult your health care provider.  Haemophilus influenzae type b (Hib) vaccine.** / Consult your health care provider.  Health Maintenance A healthy lifestyle and preventative care can promote health and wellness. Maintain regular health, dental, and eye exams. Eat a healthy diet. Foods like vegetables, fruits, whole grains, low-fat dairy products, and lean protein foods contain the nutrients you need and are low in calories. Decrease your intake of foods high in solid fats, added sugars, and salt.   Get information about a proper diet from your health care provider, if necessary. Regular physical exercise is one of the most important things you can do for your health. Most adults should get at least 150 minutes of moderate-intensity exercise (any activity that increases your heart rate and causes you to sweat) each week. In addition, most adults need muscle-strengthening exercises on 2 or more days a week.  Maintain a healthy weight. The body mass index (BMI) is a screening tool to identify possible weight problems. It provides an estimate of body fat based on height and weight. Your health care provider can find your BMI and can help you achieve or maintain  a healthy weight. For males 20 years and older: A BMI below 18.5 is considered underweight. A BMI of 18.5 to 24.9 is normal. A BMI of 25 to 29.9 is considered overweight. A BMI of 30 and above is considered obese. Maintain normal blood lipids and cholesterol by exercising and minimizing your intake of saturated fat. Eat a balanced diet with plenty of fruits and vegetables. Blood tests for lipids and cholesterol should begin at age 20 and be repeated every 5 years. If your lipid or cholesterol levels are high, you are over age 50, or you are at high risk for heart disease, you may need your cholesterol levels checked more frequently.Ongoing high lipid and cholesterol levels should be treated with medicines if diet and exercise are not working. If you smoke, find out from your health care provider how to quit. If you do not use tobacco, do not start. Lung cancer screening is recommended for adults aged 55-80 years who are at high risk for developing lung cancer because of a history of smoking. A yearly low-dose CT scan of the lungs is recommended for people who have at least a 30-pack-year history of smoking and are current smokers or have quit within the past 15 years. A pack year of smoking is smoking an average of 1 pack of cigarettes a day for 1 year (for example, a 30-pack-year history of smoking could mean smoking 1 pack a day for 30 years or 2 packs a day for 15 years). Yearly screening should continue until the smoker has stopped smoking for at least 15 years. Yearly screening should be stopped for people who develop a health problem that would prevent them from having lung cancer treatment. If you choose to drink alcohol, do not have more than 2 drinks per day. One drink is considered to be 12 oz (360 mL) of beer, 5 oz (150 mL) of wine, or 1.5 oz (45 mL) of liquor. Avoid the use of street drugs. Do not share needles with anyone. Ask for help if you need support or instructions about stopping the use  of drugs. High blood pressure causes heart disease and increases the risk of stroke. Blood pressure should be checked at least every 1-2 years. Ongoing high blood pressure should be treated with medicines if weight loss and exercise are not effective. If you are 45-79 years old, ask your health care provider if you should take aspirin to prevent heart disease. Diabetes screening involves taking a blood sample to check your fasting blood sugar level. This should be done once every 3 years after age 45 if you are at a normal weight and without risk factors for diabetes. Testing should be considered at a younger age or be carried out more frequently if you are overweight and have at least 1 risk factor for diabetes. Colorectal   cancer can be detected and often prevented. Most routine colorectal cancer screening begins at the age of 50 and continues through age 75. However, your health care provider may recommend screening at an earlier age if you have risk factors for colon cancer. On a yearly basis, your health care provider may provide home test kits to check for hidden blood in the stool. A small camera at the end of a tube may be used to directly examine the colon (sigmoidoscopy or colonoscopy) to detect the earliest forms of colorectal cancer. Talk to your health care provider about this at age 50 when routine screening begins. A direct exam of the colon should be repeated every 5-10 years through age 75, unless early forms of precancerous polyps or small growths are found. People who are at an increased risk for hepatitis B should be screened for this virus. You are considered at high risk for hepatitis B if: You were born in a country where hepatitis B occurs often. Talk with your health care provider about which countries are considered high risk. Your parents were born in a high-risk country and you have not received a shot to protect against hepatitis B (hepatitis B vaccine). You have HIV or AIDS. You  use needles to inject street drugs. You live with, or have sex with, someone who has hepatitis B. You are a man who has sex with other men (MSM). You get hemodialysis treatment. You take certain medicines for conditions like cancer, organ transplantation, and autoimmune conditions. Hepatitis C blood testing is recommended for all people born from 1945 through 1965 and any individual with known risk factors for hepatitis C. Healthy men should no longer receive prostate-specific antigen (PSA) blood tests as part of routine cancer screening. Talk to your health care provider about prostate cancer screening. Testicular cancer screening is not recommended for adolescents or adult males who have no symptoms. Screening includes self-exam, a health care provider exam, and other screening tests. Consult with your health care provider about any symptoms you have or any concerns you have about testicular cancer. Practice safe sex. Use condoms and avoid high-risk sexual practices to reduce the spread of sexually transmitted infections (STIs). You should be screened for STIs, including gonorrhea and chlamydia if: You are sexually active and are younger than 24 years. You are older than 24 years, and your health care provider tells you that you are at risk for this type of infection. Your sexual activity has changed since you were last screened, and you are at an increased risk for chlamydia or gonorrhea. Ask your health care provider if you are at risk. If you are at risk of being infected with HIV, it is recommended that you take a prescription medicine daily to prevent HIV infection. This is called pre-exposure prophylaxis (PrEP). You are considered at risk if: You are a man who has sex with other men (MSM). You are a heterosexual man who is sexually active with multiple partners. You take drugs by injection. You are sexually active with a partner who has HIV. Talk with your health care provider about whether  you are at high risk of being infected with HIV. If you choose to begin PrEP, you should first be tested for HIV. You should then be tested every 3 months for as long as you are taking PrEP. Use sunscreen. Apply sunscreen liberally and repeatedly throughout the day. You should seek shade when your shadow is shorter than you. Protect yourself by wearing long sleeves, pants,   a wide-brimmed hat, and sunglasses year round whenever you are outdoors. Tell your health care provider of new moles or changes in moles, especially if there is a change in shape or color. Also, tell your health care provider if a mole is larger than the size of a pencil eraser. Stay current with your vaccines (immunizations).

## 2015-04-28 NOTE — Progress Notes (Signed)
Patient ID: Angelica Kelley, female   DOB: 1946/03/29, 69 y.o.   MRN: [id number]  Annual Screening/Preventative Visit And Comprehensive Evaluation &  Examination  This very nice 69 y.o. [name] presents for presents for a Wellness/Preventative Visit & comprehensive evaluation and management of multiple medical co-morbidities.  Patient has been followed for HTN, Prediabetes, Hyperlipidemia, Depression  and Vitamin D Deficiency.    HTN predates since 1995. Patient's BP has been controlled at home and patient denies any cardiac symptoms as chest pain, palpitations, shortness of breath, dizziness or ankle swelling. Today's BP: 106/62 mmHg    Patient's hyperlipidemia is controlled with diet and medications. Patient denies myalgias or other medication SE's. Last lipids were not at goal with Cholesterol 196; HDL 51; LDL 118; and Triglycerides 135 on 11/30/2014.   Patient has prediabetes predating since 2012 with A1c 6.0% and patient denies reactive hypoglycemic symptoms, visual blurring, diabetic polys, or paresthesias. Last A1c was  5.5% on 11/30/2014.   Finally, patient has history of Vitamin D Deficiency of "13" in 2010 and last Vitamin D was  69 on 11/30/2014.   Medication Sig  . ALPRAZolam  1 MG tablet Take 1 mg by mouth 3 (three) times daily as needed. For anxiety.  Marland Kitchen aspirin EC 81 MG tablet Take 81 mg by mouth daily.  . bisoprolol-hctz [facility]) 5-6.25  take 1 tablet by mouth once daily for BLOOD PRESSURE AND FLUID  . buPROPion  XL 150 MG 24  Take 450 mg by mouth daily.  Marland Kitchen VITAMIN D 5000 UNITS  Take 5,000 Units by mouth daily.   Marland Kitchen esomeprazole  40 MG capsule take 1 capsule by mouth once daily at noon  . gabapentin  300 MG capsule take 1 to 2 capsules by mouth at bedtime  . hctz (MICROZIDE) 12.5 MG  take 1 capsule by mouth once daily for BLOOD PRESSURE / FLUID.  . Iron-DSS-B12-FA-C-E-Cu-Biotin (HEMAX) 150-1 MG TABS Take 150 mg by mouth daily.  . rosuvastatin  40 MG tablet take 1/2 to 1 tablet by mouth once  daily as directed for cholesterol   Allergies  Allergen Reactions  . Lipitor [Atorvastatin]     Fatigue  . Prednisone Other (See Comments)    Change in mental status   Past Medical History  Diagnosis Date  . Hypertension   . Hyperlipidemia   . GERD (gastroesophageal reflux disease)   . Anxiety   . Depression   . Alcoholism ([place])   . IBS (irritable bowel syndrome)   . Elevated hemoglobin A1c   . Fracture of right wrist   . Heart murmur    Health Maintenance  Topic Date Due  . Hepatitis C Screening  04-26-1946  . COLONOSCOPY  07/20/1993  . TETANUS/TDAP  05/15/2015  . PNA vac Low Risk Adult (2 of 2 - PPSV23) 06/10/2015  . INFLUENZA VACCINE  12/13/2015  . MAMMOGRAM  01/29/2016  . DEXA SCAN  Completed  . ZOSTAVAX  Completed   Immunization History  Administered Date(s) Administered  . Influenza, High Dose Seasonal PF 01/28/2014  . Influenza-Unspecified 03/15/2011, 01/04/2015  . Pneumococcal Conjugate-13 06/09/2014  . Pneumococcal-Unspecified 05/14/2008  . Td 05/14/2005  . Zoster 01/23/2012   Past Surgical History  Procedure Laterality Date  . Neck surgery  Remote     Ruptured disc, per patient. Got infected.   . Abdominal hysterectomy  1991  . Breast surgery Left 1989    [facility]  . Tonsillectomy  1965  . Cervical laminectomy  1977  . Appendectomy    .  Tubal ligation    . Orif wrist fracture Right 10/18/2014    Procedure: OPEN REDUCTION INTERNAL FIXATION (ORIF) RIGHT WRIST FRACTURE;  Surgeon: Dorna Leitz, MD;  Location: [facility];  Service: Orthopedics;  Laterality: Right;  . Carpal tunnel release Right 10/18/2014    Procedure: CARPAL TUNNEL RELEASE;  Surgeon: Dorna Leitz, MD;  Location: [facility];  Service: Orthopedics;  Laterality: Right;   Family History  Problem Relation Age of Onset  . Heart disease Mother   . Diabetes Mother   . Leukemia Father   . Heart disease Brother   . Cancer Brother   . Parkinson's disease Brother    Social History  Substance Use Topics  .  Smoking status: Never Smoker   . Smokeless tobacco: Never Used  . Alcohol Use: No    ROS Constitutional: Denies fever, chills, weight loss/gain, headaches, insomnia,  night sweats, and change in appetite. Does c/o fatigue. Eyes: Denies redness, blurred vision, diplopia, discharge, itchy, watery eyes.  ENT: Denies discharge, congestion, post nasal drip, epistaxis, sore throat, earache, hearing loss, dental pain, Tinnitus, Vertigo, Sinus pain, snoring.  Cardio: Denies chest pain, palpitations, irregular heartbeat, syncope, dyspnea, diaphoresis, orthopnea, PND, claudication, edema Respiratory: denies cough, dyspnea, DOE, pleurisy, hoarseness, laryngitis, wheezing.  Gastrointestinal: Denies dysphagia, heartburn, reflux, water brash, pain, cramps, nausea, vomiting, bloating, diarrhea, constipation, hematemesis, melena, hematochezia, jaundice, hemorrhoids Genitourinary: Denies dysuria, frequency, urgency, nocturia, hesitancy, discharge, hematuria, flank pain Breast: Breast lumps, nipple discharge, bleeding.  Musculoskeletal: Denies arthralgia, myalgia, stiffness, Jt. Swelling, pain, limp, and strain/sprain. Denies falls. Skin: Denies puritis, rash, hives, warts, acne, eczema, changing in skin lesion Neuro: No weakness, tremor, incoordination, spasms, paresthesia, pain Psychiatric: Denies confusion, memory loss, sensory loss. Denies Depression. Endocrine: Denies change in weight, skin, hair change, nocturia, and paresthesia, diabetic polys, visual blurring, hyper / hypo glycemic episodes.  Heme/Lymph: No excessive bleeding, bruising, enlarged lymph nodes.  Physical Exam  BP 106/62 mmHg  Pulse 56  Temp(Src) 97.5 F (36.4 C)  Resp 16  Ht 5' 4.5" (1.638 m)  Wt 154 lb 6.4 oz (70.035 kg)  BMI 26.10 kg/m2  General Appearance: Well nourished and in no apparent distress. Eyes: PERRLA, EOMs, conjunctiva no swelling or erythema, normal fundi and vessels. Sinuses: No frontal/maxillary  tenderness ENT/Mouth: EACs patent / TMs  nl. Nares clear without erythema, swelling, mucoid exudates. Oral hygiene is good. No erythema, swelling, or exudate. Tongue normal, non-obstructing. Tonsils not swollen or erythematous. Hearing normal.  Neck: Supple, thyroid normal. No bruits, nodes or JVD. Respiratory: Respiratory effort normal.  BS equal and clear bilateral without rales, rhonci, wheezing or stridor. Cardio: Heart sounds are normal with regular rate and rhythm and no murmurs, rubs or gallops. Peripheral pulses are normal and equal bilaterally without edema. No aortic or femoral bruits. Chest: symmetric with normal excursions and percussion. Breasts: Symmetric, without lumps, nipple discharge, retractions, or fibrocystic changes.  Abdomen: Flat, soft, with bowl sounds. Nontender, no guarding, rebound, hernias, masses, or organomegaly.  Lymphatics: Non tender without lymphadenopathy.  Musculoskeletal: Full ROM all peripheral extremities, joint stability, 5/5 strength, and normal gait. Skin: Warm and dry without rashes, lesions, cyanosis, clubbing or  ecchymosis.  Neuro: Cranial nerves intact, reflexes equal bilaterally. Normal muscle tone, no cerebellar symptoms. Sensation intact.  Pysch: Alert and oriented X 3, normal affect, Insight and Judgment appropriate.   Assessment and Plan  1. Annual Preventative Screening Examination  1. Encounter for general adult medical examination with abnormal findings  - Microalbumin / creatinine urine ratio - EKG  12-Lead - POC Hemoccult Bld/Stl  - Urinalysis, Routine w reflex microscopic  - CBC with Differential/Platelet - BASIC METABOLIC PANEL WITH GFR - Hepatic function panel - Magnesium - Lipid panel - TSH - Hemoglobin A1c - Insulin, random - VITAMIN D 25 Hydroxy  - Urine Microscopic  2. Essential hypertension  - Microalbumin / creatinine urine ratio - EKG 12-Lead - TSH  3. Hyperlipidemia  - Lipid panel - TSH  4.  PreDiabetes  - Hemoglobin A1c - Insulin, random  5. Vitamin D deficiency  - VITAMIN D 25 Hydroxy   6. Gastroesophageal reflux disease   7. Depression, controlled   8. Screening for rectal cancer  - POC Hemoccult Bld/Stl   9. Medication management  - Urinalysis, Routine w reflex microscopic  - CBC with Differential/Platelet - BASIC METABOLIC PANEL WITH GFR - Hepatic function panel - Magnesium - Urine Microscopic   Continue prudent diet as discussed, weight control, BP monitoring, regular exercise, and medications. Discussed med's effects and SE's. Screening labs and tests as requested with regular follow-up as recommended. Over 40 minutes of exam, counseling, chart review and high complex critical decision making was performed.

## 2015-04-29 DIAGNOSIS — H40013 Open angle with borderline findings, low risk, bilateral: Secondary | ICD-10-CM | POA: Diagnosis not present

## 2015-04-29 LAB — CBC WITH DIFFERENTIAL/PLATELET
Basophils Absolute: 0.1 10*3/uL (ref 0.0–0.1)
Basophils Relative: 1 % (ref 0–1)
Eosinophils Absolute: 0.1 10*3/uL (ref 0.0–0.7)
Eosinophils Relative: 2 % (ref 0–5)
HCT: 41.3 % (ref 36.0–46.0)
HEMOGLOBIN: 13.8 g/dL (ref 12.0–15.0)
Lymphocytes Relative: 21 % (ref 12–46)
Lymphs Abs: 1.1 10*3/uL (ref 0.7–4.0)
MCH: 29.4 pg (ref 26.0–34.0)
MCHC: 33.4 g/dL (ref 30.0–36.0)
MCV: 87.9 fL (ref 78.0–100.0)
MONOS PCT: 6 % (ref 3–12)
MPV: 11.4 fL (ref 8.6–12.4)
Monocytes Absolute: 0.3 10*3/uL (ref 0.1–1.0)
NEUTROS ABS: 3.8 10*3/uL (ref 1.7–7.7)
NEUTROS PCT: 70 % (ref 43–77)
Platelets: 187 10*3/uL (ref 150–400)
RBC: 4.7 MIL/uL (ref 3.87–5.11)
RDW: 13.1 % (ref 11.5–15.5)
WBC: 5.4 10*3/uL (ref 4.0–10.5)

## 2015-04-29 LAB — BASIC METABOLIC PANEL WITH GFR
BUN: 24 mg/dL (ref 7–25)
CHLORIDE: 103 mmol/L (ref 98–110)
CO2: 26 mmol/L (ref 20–31)
CREATININE: 0.82 mg/dL (ref 0.60–0.93)
Calcium: 9.4 mg/dL (ref 8.6–10.4)
GFR, Est African American: 83 mL/min (ref >=60)
GFR, Est Non African American: 72 mL/min (ref >=60)
Glucose, Bld: 94 mg/dL (ref 65–99)
Potassium: 4.2 mmol/L (ref 3.5–5.3)
Sodium: 143 mmol/L (ref 135–146)

## 2015-04-29 LAB — HEPATIC FUNCTION PANEL
ALBUMIN: 4.6 g/dL (ref 3.6–5.1)
ALT: 12 U/L (ref 6–29)
AST: 17 U/L (ref 10–35)
Alkaline Phosphatase: 50 U/L (ref 33–130)
Bilirubin, Direct: 0.1 mg/dL (ref <=0.2)
Indirect Bilirubin: 0.4 mg/dL (ref 0.2–1.2)
Total Bilirubin: 0.5 mg/dL (ref 0.2–1.2)
Total Protein: 6.6 g/dL (ref 6.1–8.1)

## 2015-04-29 LAB — LIPID PANEL
CHOL/HDL RATIO: 2.9 ratio (ref <=5.0)
Cholesterol: 169 mg/dL (ref 125–200)
HDL: 58 mg/dL (ref >=46)
LDL CALC: 86 mg/dL (ref <130)
Triglycerides: 123 mg/dL (ref <150)
VLDL: 25 mg/dL (ref <30)

## 2015-04-29 LAB — TSH: TSH: 1.415 u[IU]/mL (ref 0.350–4.500)

## 2015-04-29 LAB — HEMOGLOBIN A1C
Hgb A1c MFr Bld: 5.6 % (ref <5.7)
MEAN PLASMA GLUCOSE: 114 mg/dL (ref <117)

## 2015-04-29 LAB — URINALYSIS, MICROSCOPIC ONLY
BACTERIA UA: NONE SEEN [HPF]
CASTS: NONE SEEN [LPF]
Crystals: NONE SEEN [HPF]
Yeast: NONE SEEN [HPF]

## 2015-04-29 LAB — MAGNESIUM: MAGNESIUM: 2.3 mg/dL (ref 1.5–2.5)

## 2015-04-29 LAB — MICROALBUMIN / CREATININE URINE RATIO
Creatinine, Urine: 133 mg/dL (ref 20–320)
MICROALB UR: 0.4 mg/dL
Microalb Creat Ratio: 3 mcg/mg creat (ref <30)

## 2015-04-29 LAB — URINALYSIS, ROUTINE W REFLEX MICROSCOPIC
BILIRUBIN URINE: NEGATIVE
Glucose, UA: NEGATIVE
Hgb urine dipstick: NEGATIVE
Ketones, ur: NEGATIVE
Nitrite: NEGATIVE
PH: 6 (ref 5.0–8.0)
Protein, ur: NEGATIVE
Specific Gravity, Urine: 1.022 (ref 1.001–1.035)

## 2015-04-29 LAB — INSULIN, RANDOM: Insulin: 2.8 u[IU]/mL (ref 2.0–19.6)

## 2015-04-29 LAB — VITAMIN D 25 HYDROXY (VIT D DEFICIENCY, FRACTURES): Vit D, 25-Hydroxy: 83 ng/mL (ref 30–100)

## 2015-04-30 ENCOUNTER — Encounter: Payer: Self-pay | Admitting: Internal Medicine

## 2015-05-11 ENCOUNTER — Ambulatory Visit
Admission: RE | Admit: 2015-05-11 | Discharge: 2015-05-11 | Disposition: A | Discharge status: Home / Self Care | Payer: Medicare Other | Source: Ambulatory Visit

## 2015-05-11 DIAGNOSIS — Z1231 Encounter for screening mammogram for malignant neoplasm of breast: Secondary | ICD-10-CM

## 2015-06-05 ENCOUNTER — Other Ambulatory Visit: Payer: Self-pay | Admitting: Internal Medicine

## 2015-06-30 DIAGNOSIS — M5442 Lumbago with sciatica, left side: Secondary | ICD-10-CM | POA: Diagnosis not present

## 2015-06-30 DIAGNOSIS — M5441 Lumbago with sciatica, right side: Secondary | ICD-10-CM | POA: Diagnosis not present

## 2015-06-30 DIAGNOSIS — M545 Low back pain: Secondary | ICD-10-CM | POA: Diagnosis not present

## 2015-07-11 DIAGNOSIS — M5442 Lumbago with sciatica, left side: Secondary | ICD-10-CM | POA: Diagnosis not present

## 2015-07-11 DIAGNOSIS — M5441 Lumbago with sciatica, right side: Secondary | ICD-10-CM | POA: Diagnosis not present

## 2015-07-11 DIAGNOSIS — M5416 Radiculopathy, lumbar region: Secondary | ICD-10-CM | POA: Diagnosis not present

## 2015-07-27 ENCOUNTER — Encounter: Payer: Self-pay | Admitting: Physician Assistant

## 2015-07-27 ENCOUNTER — Ambulatory Visit (INDEPENDENT_AMBULATORY_CARE_PROVIDER_SITE_OTHER): Payer: Medicare Other | Admitting: Physician Assistant

## 2015-07-27 VITALS — BP 120/70 | HR 61 | Temp 98.1°F | Resp 16 | Ht 64.5 in | Wt 148.4 lb

## 2015-07-27 DIAGNOSIS — F419 Anxiety disorder, unspecified: Secondary | ICD-10-CM

## 2015-07-27 DIAGNOSIS — Z0001 Encounter for general adult medical examination with abnormal findings: Secondary | ICD-10-CM | POA: Diagnosis not present

## 2015-07-27 DIAGNOSIS — E559 Vitamin D deficiency, unspecified: Secondary | ICD-10-CM | POA: Diagnosis not present

## 2015-07-27 DIAGNOSIS — I10 Essential (primary) hypertension: Secondary | ICD-10-CM

## 2015-07-27 DIAGNOSIS — Z79899 Other long term (current) drug therapy: Secondary | ICD-10-CM

## 2015-07-27 DIAGNOSIS — R6889 Other general symptoms and signs: Secondary | ICD-10-CM

## 2015-07-27 DIAGNOSIS — Z0000 Encounter for general adult medical examination without abnormal findings: Secondary | ICD-10-CM

## 2015-07-27 DIAGNOSIS — N3281 Overactive bladder: Secondary | ICD-10-CM

## 2015-07-27 DIAGNOSIS — E785 Hyperlipidemia, unspecified: Secondary | ICD-10-CM

## 2015-07-27 DIAGNOSIS — F1020 Alcohol dependence, uncomplicated: Secondary | ICD-10-CM | POA: Diagnosis not present

## 2015-07-27 DIAGNOSIS — Z23 Encounter for immunization: Secondary | ICD-10-CM

## 2015-07-27 DIAGNOSIS — F32A Depression, unspecified: Secondary | ICD-10-CM

## 2015-07-27 DIAGNOSIS — R7309 Other abnormal glucose: Secondary | ICD-10-CM

## 2015-07-27 DIAGNOSIS — F329 Major depressive disorder, single episode, unspecified: Secondary | ICD-10-CM

## 2015-07-27 DIAGNOSIS — K589 Irritable bowel syndrome without diarrhea: Secondary | ICD-10-CM

## 2015-07-27 DIAGNOSIS — K219 Gastro-esophageal reflux disease without esophagitis: Secondary | ICD-10-CM

## 2015-07-27 LAB — CBC WITH DIFFERENTIAL/PLATELET
Basophils Absolute: 0.1 10*3/uL (ref 0.0–0.1)
Basophils Relative: 1 % (ref 0–1)
EOS ABS: 0.1 10*3/uL (ref 0.0–0.7)
EOS PCT: 1 % (ref 0–5)
HCT: 42.6 % (ref 36.0–46.0)
Hemoglobin: 14 g/dL (ref 12.0–15.0)
LYMPHS ABS: 1.3 10*3/uL (ref 0.7–4.0)
Lymphocytes Relative: 15 % (ref 12–46)
MCH: 29.4 pg (ref 26.0–34.0)
MCHC: 32.9 g/dL (ref 30.0–36.0)
MCV: 89.5 fL (ref 78.0–100.0)
MONOS PCT: 7 % (ref 3–12)
MPV: 12.3 fL (ref 8.6–12.4)
Monocytes Absolute: 0.6 10*3/uL (ref 0.1–1.0)
Neutro Abs: 6.5 10*3/uL (ref 1.7–7.7)
Neutrophils Relative %: 76 % (ref 43–77)
PLATELETS: 191 10*3/uL (ref 150–400)
RBC: 4.76 MIL/uL (ref 3.87–5.11)
RDW: 12.8 % (ref 11.5–15.5)
WBC: 8.5 10*3/uL (ref 4.0–10.5)

## 2015-07-27 LAB — HEPATIC FUNCTION PANEL
ALK PHOS: 53 U/L (ref 33–130)
ALT: 15 U/L (ref 6–29)
AST: 18 U/L (ref 10–35)
Albumin: 4.9 g/dL (ref 3.6–5.1)
BILIRUBIN DIRECT: 0.1 mg/dL (ref <=0.2)
Indirect Bilirubin: 0.4 mg/dL (ref 0.2–1.2)
Total Bilirubin: 0.5 mg/dL (ref 0.2–1.2)
Total Protein: 6.7 g/dL (ref 6.1–8.1)

## 2015-07-27 LAB — BASIC METABOLIC PANEL WITH GFR
BUN: 29 mg/dL — ABNORMAL HIGH (ref 7–25)
CO2: 32 mmol/L — ABNORMAL HIGH (ref 20–31)
CREATININE: 1.24 mg/dL — AB (ref 0.60–0.93)
Calcium: 10 mg/dL (ref 8.6–10.4)
Chloride: 100 mmol/L (ref 98–110)
GFR, Est African American: 50 mL/min — ABNORMAL LOW (ref >=60)
GFR, Est Non African American: 43 mL/min — ABNORMAL LOW (ref >=60)
Glucose, Bld: 91 mg/dL (ref 65–99)
Potassium: 4.9 mmol/L (ref 3.5–5.3)
Sodium: 143 mmol/L (ref 135–146)

## 2015-07-27 LAB — LIPID PANEL
CHOLESTEROL: 179 mg/dL (ref 125–200)
HDL: 61 mg/dL (ref >=46)
LDL CALC: 99 mg/dL (ref <130)
Total CHOL/HDL Ratio: 2.9 Ratio (ref <=5.0)
Triglycerides: 97 mg/dL (ref <150)
VLDL: 19 mg/dL (ref <30)

## 2015-07-27 LAB — MAGNESIUM: MAGNESIUM: 1.9 mg/dL (ref 1.5–2.5)

## 2015-07-27 NOTE — Patient Instructions (Signed)
Preventive Care for Adults A healthy lifestyle and preventive care can promote health and wellness. Preventive health guidelines for women include the following key practices.  A routine yearly physical is a good way to check with your health care provider about your health and preventive screening. It is a chance to share any concerns and updates on your health and to receive a thorough exam.  Visit your dentist for a routine exam and preventive care every 6 months. Brush your teeth twice a day and floss once a day. Good oral hygiene prevents tooth decay and gum disease.  The frequency of eye exams is based on your age, health, family medical history, use of contact lenses, and other factors. Follow your health care provider's recommendations for frequency of eye exams.  Eat a healthy diet. Foods like vegetables, fruits, whole grains, low-fat dairy products, and lean protein foods contain the nutrients you need without too many calories. Decrease your intake of foods high in solid fats, added sugars, and salt. Eat the right amount of calories for you.Get information about a proper diet from your health care provider, if necessary.  Regular physical exercise is one of the most important things you can do for your health. Most adults should get at least 150 minutes of moderate-intensity exercise (any activity that increases your heart rate and causes you to sweat) each week. In addition, most adults need muscle-strengthening exercises on 2 or more days a week.  Maintain a healthy weight. The body mass index (BMI) is a screening tool to identify possible weight problems. It provides an estimate of body fat based on height and weight. Your health care provider can find your BMI and can help you achieve or maintain a healthy weight.For adults 20 years and older:  A BMI below 18.5 is considered underweight.  A BMI of 18.5 to 24.9 is normal.  A BMI of 25 to 29.9 is considered overweight.  A BMI of  30 and above is considered obese.  Maintain normal blood lipids and cholesterol levels by exercising and minimizing your intake of saturated fat. Eat a balanced diet with plenty of fruit and vegetables. If your lipid or cholesterol levels are high, you are over 50, or you are at high risk for heart disease, you may need your cholesterol levels checked more frequently.Ongoing high lipid and cholesterol levels should be treated with medicines if diet and exercise are not working.  If you smoke, find out from your health care provider how to quit. If you do not use tobacco, do not start.  Lung cancer screening is recommended for adults aged 83-80 years who are at high risk for developing lung cancer because of a history of smoking. A yearly low-dose CT scan of the lungs is recommended for people who have at least a 30-pack-year history of smoking and are a current smoker or have quit within the past 15 years. A pack year of smoking is smoking an average of 1 pack of cigarettes a day for 1 year (for example: 1 pack a day for 30 years or 2 packs a day for 15 years). Yearly screening should continue until the smoker has stopped smoking for at least 15 years. Yearly screening should be stopped for people who develop a health problem that would prevent them from having lung cancer treatment.  Avoid use of street drugs. Do not share needles with anyone. Ask for help if you need support or instructions about stopping the use of drugs.  High blood  pressure causes heart disease and increases the risk of stroke.  Ongoing high blood pressure should be treated with medicines if weight loss and exercise do not work.  If you are 55-79 years old, ask your health care provider if you should take aspirin to prevent strokes.  Diabetes screening involves taking a blood sample to check your fasting blood sugar level. This should be done once every 3 years, after age 45, if you are within normal weight and without risk  factors for diabetes. Testing should be considered at a younger age or be carried out more frequently if you are overweight and have at least 1 risk factor for diabetes.  Breast cancer screening is essential preventive care for women. You should practice "breast self-awareness." This means understanding the normal appearance and feel of your breasts and may include breast self-examination. Any changes detected, no matter how small, should be reported to a health care provider. Women in their 20s and 30s should have a clinical breast exam (CBE) by a health care provider as part of a regular health exam every 1 to 3 years. After age 40, women should have a CBE every year. Starting at age 40, women should consider having a mammogram (breast X-ray test) every year. Women who have a family history of breast cancer should talk to their health care provider about genetic screening. Women at a high risk of breast cancer should talk to their health care providers about having an MRI and a mammogram every year.  Breast cancer gene (BRCA)-related cancer risk assessment is recommended for women who have family members with BRCA-related cancers. BRCA-related cancers include breast, ovarian, tubal, and peritoneal cancers. Having family members with these cancers may be associated with an increased risk for harmful changes (mutations) in the breast cancer genes BRCA1 and BRCA2. Results of the assessment will determine the need for genetic counseling and BRCA1 and BRCA2 testing.  Routine pelvic exams to screen for cancer are no longer recommended for nonpregnant women who are considered low risk for cancer of the pelvic organs (ovaries, uterus, and vagina) and who do not have symptoms. Ask your health care provider if a screening pelvic exam is right for you.  If you have had past treatment for cervical cancer or a condition that could lead to cancer, you need Pap tests and screening for cancer for at least 20 years after  your treatment. If Pap tests have been discontinued, your risk factors (such as having a new sexual partner) need to be reassessed to determine if screening should be resumed. Some women have medical problems that increase the chance of getting cervical cancer. In these cases, your health care provider may recommend more frequent screening and Pap tests.    Colorectal cancer can be detected and often prevented. Most routine colorectal cancer screening begins at the age of 50 years and continues through age 75 years. However, your health care provider may recommend screening at an earlier age if you have risk factors for colon cancer. On a yearly basis, your health care provider may provide home test kits to check for hidden blood in the stool. Use of a small camera at the end of a tube, to directly examine the colon (sigmoidoscopy or colonoscopy), can detect the earliest forms of colorectal cancer. Talk to your health care provider about this at age 50, when routine screening begins. Direct exam of the colon should be repeated every 5-10 years through age 75 years, unless early forms of pre-cancerous polyps   or small growths are found.  Osteoporosis is a disease in which the bones lose minerals and strength with aging. This can result in serious bone fractures or breaks. The risk of osteoporosis can be identified using a bone density scan. Women ages 62 years and over and women at risk for fractures or osteoporosis should discuss screening with their health care providers. Ask your health care provider whether you should take a calcium supplement or vitamin D to reduce the rate of osteoporosis.  Menopause can be associated with physical symptoms and risks. Hormone replacement therapy is available to decrease symptoms and risks. You should talk to your health care provider about whether hormone replacement therapy is right for you.  Use sunscreen. Apply sunscreen liberally and repeatedly throughout the day.  You should seek shade when your shadow is shorter than you. Protect yourself by wearing long sleeves, pants, a wide-brimmed hat, and sunglasses year round, whenever you are outdoors.  Once a month, do a whole body skin exam, using a mirror to look at the skin on your back. Tell your health care provider of new moles, moles that have irregular borders, moles that are larger than a pencil eraser, or moles that have changed in shape or color.  Stay current with required vaccines (immunizations).  Influenza vaccine. All adults should be immunized every year.  Tetanus, diphtheria, and acellular pertussis (Td, Tdap) vaccine. Pregnant women should receive 1 dose of Tdap vaccine during each pregnancy. The dose should be obtained regardless of the length of time since the last dose. Immunization is preferred during the 27th-36th week of gestation. An adult who has not previously received Tdap or who does not know her vaccine status should receive 1 dose of Tdap. This initial dose should be followed by tetanus and diphtheria toxoids (Td) booster doses every 10 years. Adults with an unknown or incomplete history of completing a 3-dose immunization series with Td-containing vaccines should begin or complete a primary immunization series including a Tdap dose. Adults should receive a Td booster every 10 years.    Zoster vaccine. One dose is recommended for adults aged 4 years or older unless certain conditions are present.    Pneumococcal 13-valent conjugate (PCV13) vaccine. When indicated, a person who is uncertain of her immunization history and has no record of immunization should receive the PCV13 vaccine. An adult aged 35 years or older who has certain medical conditions and has not been previously immunized should receive 1 dose of PCV13 vaccine. This PCV13 should be followed with a dose of pneumococcal polysaccharide (PPSV23) vaccine. The PPSV23 vaccine dose should be obtained at least 8 weeks after the  dose of PCV13 vaccine. An adult aged 85 years or older who has certain medical conditions and previously received 1 or more doses of PPSV23 vaccine should receive 1 dose of PCV13. The PCV13 vaccine dose should be obtained 1 or more years after the last PPSV23 vaccine dose.    Pneumococcal polysaccharide (PPSV23) vaccine. When PCV13 is also indicated, PCV13 should be obtained first. All adults aged 15 years and older should be immunized. An adult younger than age 38 years who has certain medical conditions should be immunized. Any person who resides in a nursing home or long-term care facility should be immunized. An adult smoker should be immunized. People with an immunocompromised condition and certain other conditions should receive both PCV13 and PPSV23 vaccines. People with human immunodeficiency virus (HIV) infection should be immunized as soon as possible after diagnosis. Immunization during  chemotherapy or radiation therapy should be avoided. Routine use of PPSV23 vaccine is not recommended for American Indians, [place]atives, or people younger than 65 years unless there are medical conditions that require PPSV23 vaccine. When indicated, people who have unknown immunization and have no record of immunization should receive PPSV23 vaccine. One-time revaccination 5 years after the first dose of PPSV23 is recommended for people aged 19-64 years who have chronic kidney failure, nephrotic syndrome, asplenia, or immunocompromised conditions. People who received 1-2 doses of PPSV23 before age 59 years should receive another dose of PPSV23 vaccine at age 92 years or later if at least 5 years have passed since the previous dose. Doses of PPSV23 are not needed for people immunized with PPSV23 at or after age 15 years.   Preventive Services / Frequency  Ages 68 years and over  Blood pressure check.  Lipid and cholesterol check.  Lung cancer screening. / Every year if you are aged 47-80 years and have a  30-pack-year history of smoking and currently smoke or have quit within the past 15 years. Yearly screening is stopped once you have quit smoking for at least 15 years or develop a health problem that would prevent you from having lung cancer treatment.  Clinical breast exam.** / Every year after age 95 years.  BRCA-related cancer risk assessment.** / For women who have family members with a BRCA-related cancer (breast, ovarian, tubal, or peritoneal cancers).  Mammogram.** / Every year beginning at age 60 years and continuing for as long as you are in good health. Consult with your health care provider.  Pap test.** / Every 3 years starting at age 68 years through age 26 or 11 years with 3 consecutive normal Pap tests. Testing can be stopped between 65 and 70 years with 3 consecutive normal Pap tests and no abnormal Pap or HPV tests in the past 10 years.  Fecal occult blood test (FOBT) of stool. / Every year beginning at age 71 years and continuing until age 28 years. You may not need to do this test if you get a colonoscopy every 10 years.  Flexible sigmoidoscopy or colonoscopy.** / Every 5 years for a flexible sigmoidoscopy or every 10 years for a colonoscopy beginning at age 90 years and continuing until age 38 years.  Hepatitis C blood test.** / For all people born from [birth year] through 1967 and any individual with known risks for hepatitis C.  Osteoporosis screening.** / A one-time screening for women ages 3 years and over and women at risk for fractures or osteoporosis.  Skin self-exam. / Monthly.  Influenza vaccine. / Every year.  Tetanus, diphtheria, and acellular pertussis (Tdap/Td) vaccine.** / 1 dose of Td every 10 years.  Zoster vaccine.** / 1 dose for adults aged 82 years or older.  Pneumococcal 13-valent conjugate (PCV13) vaccine.** / Consult your health care provider.  Pneumococcal polysaccharide (PPSV23) vaccine.** / 1 dose for all adults aged 12 years and older. Screening  for abdominal aortic aneurysm (AAA)  by ultrasound is recommended for people who have history of high blood pressure or who are current or former smokers.
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
## 2015-07-28 LAB — HEMOGLOBIN A1C
HEMOGLOBIN A1C: 5.5 % (ref <5.7)
MEAN PLASMA GLUCOSE: 111 mg/dL (ref <117)

## 2015-07-28 LAB — VITAMIN D 25 HYDROXY (VIT D DEFICIENCY, FRACTURES): Vit D, 25-Hydroxy: 91 ng/mL (ref 30–100)

## 2015-07-28 LAB — TSH: TSH: 2.38 m[IU]/L

## 2015-08-29 ENCOUNTER — Other Ambulatory Visit: Payer: Self-pay | Admitting: Internal Medicine

## 2015-08-30 ENCOUNTER — Ambulatory Visit: Payer: Self-pay

## 2015-08-31 ENCOUNTER — Ambulatory Visit (INDEPENDENT_AMBULATORY_CARE_PROVIDER_SITE_OTHER): Payer: Medicare Other | Admitting: *Deleted

## 2015-08-31 DIAGNOSIS — N289 Disorder of kidney and ureter, unspecified: Secondary | ICD-10-CM | POA: Diagnosis not present

## 2015-08-31 DIAGNOSIS — Z79899 Other long term (current) drug therapy: Secondary | ICD-10-CM

## 2015-08-31 LAB — BASIC METABOLIC PANEL WITH GFR
BUN: 26 mg/dL — ABNORMAL HIGH (ref 7–25)
CALCIUM: 9.3 mg/dL (ref 8.6–10.4)
CO2: 28 mmol/L (ref 20–31)
Chloride: 101 mmol/L (ref 98–110)
Creat: 1.24 mg/dL — ABNORMAL HIGH (ref 0.60–0.93)
GFR, EST AFRICAN AMERICAN: 50 mL/min — AB (ref >=60)
GFR, EST NON AFRICAN AMERICAN: 43 mL/min — AB (ref >=60)
GLUCOSE: 91 mg/dL (ref 65–99)
Potassium: 4.2 mmol/L (ref 3.5–5.3)
SODIUM: 142 mmol/L (ref 135–146)

## 2015-09-21 ENCOUNTER — Ambulatory Visit: Payer: Self-pay | Admitting: Internal Medicine

## 2015-09-21 ENCOUNTER — Ambulatory Visit: Payer: Self-pay | Admitting: Physician Assistant

## 2015-09-22 ENCOUNTER — Ambulatory Visit (INDEPENDENT_AMBULATORY_CARE_PROVIDER_SITE_OTHER): Payer: Medicare Other | Admitting: Internal Medicine

## 2015-09-22 ENCOUNTER — Encounter: Payer: Self-pay | Admitting: Internal Medicine

## 2015-09-22 VITALS — BP 126/68 | HR 60 | Temp 97.8°F | Resp 16 | Ht 64.5 in | Wt 155.0 lb

## 2015-09-22 DIAGNOSIS — N183 Chronic kidney disease, stage 3 unspecified: Secondary | ICD-10-CM

## 2015-09-22 LAB — BASIC METABOLIC PANEL WITH GFR
BUN: 20 mg/dL (ref 7–25)
CALCIUM: 8.9 mg/dL (ref 8.6–10.4)
CO2: 31 mmol/L (ref 20–31)
CREATININE: 0.94 mg/dL — AB (ref 0.60–0.93)
Chloride: 105 mmol/L (ref 98–110)
GFR, Est African American: 70 mL/min (ref >=60)
GFR, Est Non African American: 61 mL/min (ref >=60)
GLUCOSE: 86 mg/dL (ref 65–99)
Potassium: 3.9 mmol/L (ref 3.5–5.3)
Sodium: 142 mmol/L (ref 135–146)

## 2015-09-22 LAB — CBC WITH DIFFERENTIAL/PLATELET
Basophils Absolute: 54 cells/uL (ref 0–200)
Basophils Relative: 1 %
EOS PCT: 2 %
Eosinophils Absolute: 108 cells/uL (ref 15–500)
HEMATOCRIT: 38.3 % (ref 35.0–45.0)
HEMOGLOBIN: 12.7 g/dL (ref 11.7–15.5)
Lymphocytes Relative: 18 %
Lymphs Abs: 972 cells/uL (ref 850–3900)
MCH: 29.2 pg (ref 27.0–33.0)
MCHC: 33.2 g/dL (ref 32.0–36.0)
MCV: 88 fL (ref 80.0–100.0)
MPV: 11.7 fL (ref 7.5–12.5)
Monocytes Absolute: 378 cells/uL (ref 200–950)
Monocytes Relative: 7 %
NEUTROS PCT: 72 %
Neutro Abs: 3888 cells/uL (ref 1500–7800)
Platelets: 187 10*3/uL (ref 140–400)
RBC: 4.35 MIL/uL (ref 3.80–5.10)
RDW: 12.9 % (ref 11.0–15.0)
WBC: 5.4 10*3/uL (ref 3.8–10.8)

## 2015-09-22 LAB — HEPATIC FUNCTION PANEL
ALT: 12 U/L (ref 6–29)
AST: 17 U/L (ref 10–35)
Albumin: 4.4 g/dL (ref 3.6–5.1)
Alkaline Phosphatase: 51 U/L (ref 33–130)
Bilirubin, Direct: 0.1 mg/dL (ref <=0.2)
Indirect Bilirubin: 0.3 mg/dL (ref 0.2–1.2)
TOTAL PROTEIN: 6.2 g/dL (ref 6.1–8.1)
Total Bilirubin: 0.4 mg/dL (ref 0.2–1.2)

## 2015-09-22 NOTE — Progress Notes (Signed)
   Subjective:    Patient ID: Angelica Kelley, female    DOB: [birth year], 70 y.o.   MRN: [id number]  HPI  Patient presents to the office for evaluation of bilateral leg swelling, HTN, and to recheck lithium levels.  She reports that she has been doing all right since her last visit.  She notes that she has not checked her blood pressure at home.  She notes that she still does take her HCTZ at home as needed for her feet swelling.  She reports that she is not taking lithium.  She reports that she hasn't taken that in a long time.  She is taking lamotrigine. She has no other medication changes.   Review of Systems  Constitutional: Negative for fever, chills and fatigue.  Respiratory: Negative for chest tightness and shortness of breath.   Cardiovascular: Positive for leg swelling. Negative for chest pain and palpitations.  Gastrointestinal: Negative for nausea and vomiting.  Genitourinary: Negative for dysuria, urgency, frequency, hematuria and difficulty urinating.  Neurological: Negative for dizziness, numbness and headaches.  Psychiatric/Behavioral: Negative for confusion, dysphoric mood and decreased concentration. The patient is not nervous/anxious.        Objective:   Physical Exam  Constitutional: She is oriented to person, place, and time. She appears well-developed and well-nourished. No distress.  HENT:  Head: Normocephalic.  Mouth/Throat: Oropharynx is clear and moist. No oropharyngeal exudate.  Eyes: Conjunctivae are normal. No scleral icterus.  Neck: Normal range of motion. Neck supple. No JVD present. No thyromegaly present.  Cardiovascular: Normal rate, regular rhythm, normal heart sounds and intact distal pulses.  Exam reveals no gallop and no friction rub.   No murmur heard. Pulmonary/Chest: Effort normal and breath sounds normal. No respiratory distress. She has no wheezes. She has no rales. She exhibits no tenderness.  Abdominal: Soft. Bowel sounds are normal. She exhibits  no distension and no mass. There is no tenderness. There is no rebound and no guarding.  Musculoskeletal: Normal range of motion.  Lymphadenopathy:    She has no cervical adenopathy.  Neurological: She is alert and oriented to person, place, and time. No cranial nerve deficit. Coordination normal.  Skin: Skin is warm and dry. She is not diaphoretic.  Psychiatric: She has a normal mood and affect. Her behavior is normal. Judgment and thought content normal.  Nursing note and vitals reviewed.   Filed Vitals:   09/22/15 1436  BP: 126/68  Pulse: 60  Temp: 97.8 F (36.6 C)  Resp: 16   Wt Readings from Last 3 Encounters:  09/22/15 155 lb (70.308 kg)  07/27/15 148 lb 6.4 oz (67.314 kg)  04/28/15 154 lb 6.4 oz (70.035 kg)        Assessment & Plan:    1. Chronic kidney disease, stage 3 (moderate) -discussed avoiding fluid pills if possible -recommended 6-8 glasses of water daily -no visible swelling of the legs today -weight mildly up but patient also reports eating out a lot in the past couple weeks as her grandson is in town. - CBC with Differential/Platelet - BASIC METABOLIC PANEL WITH GFR - Hepatic function panel - Lamotrigine level

## 2015-09-24 LAB — LAMOTRIGINE LEVEL: Lamotrigine Lvl: 10.5 ug/mL (ref 4.0–18.0)

## 2015-09-29 DIAGNOSIS — H25013 Cortical age-related cataract, bilateral: Secondary | ICD-10-CM | POA: Diagnosis not present

## 2015-09-29 DIAGNOSIS — H40013 Open angle with borderline findings, low risk, bilateral: Secondary | ICD-10-CM | POA: Diagnosis not present

## 2015-09-29 DIAGNOSIS — H2513 Age-related nuclear cataract, bilateral: Secondary | ICD-10-CM | POA: Diagnosis not present

## 2015-09-29 DIAGNOSIS — H43812 Vitreous degeneration, left eye: Secondary | ICD-10-CM | POA: Diagnosis not present

## 2015-10-06 ENCOUNTER — Other Ambulatory Visit: Payer: Self-pay | Admitting: Internal Medicine

## 2015-10-17 ENCOUNTER — Other Ambulatory Visit: Payer: Self-pay | Admitting: Internal Medicine

## 2015-10-18 ENCOUNTER — Other Ambulatory Visit: Payer: Self-pay | Admitting: Internal Medicine

## 2015-10-27 ENCOUNTER — Encounter: Payer: Self-pay | Admitting: Internal Medicine

## 2015-10-27 ENCOUNTER — Ambulatory Visit (INDEPENDENT_AMBULATORY_CARE_PROVIDER_SITE_OTHER): Payer: Medicare Other | Admitting: Internal Medicine

## 2015-10-27 VITALS — BP 112/68 | HR 52 | Temp 97.1°F | Resp 16 | Ht 64.5 in | Wt 156.4 lb

## 2015-10-27 DIAGNOSIS — E785 Hyperlipidemia, unspecified: Secondary | ICD-10-CM | POA: Diagnosis not present

## 2015-10-27 DIAGNOSIS — Z79899 Other long term (current) drug therapy: Secondary | ICD-10-CM

## 2015-10-27 DIAGNOSIS — F3130 Bipolar disorder, current episode depressed, mild or moderate severity, unspecified: Secondary | ICD-10-CM | POA: Diagnosis not present

## 2015-10-27 DIAGNOSIS — R7309 Other abnormal glucose: Secondary | ICD-10-CM

## 2015-10-27 DIAGNOSIS — E559 Vitamin D deficiency, unspecified: Secondary | ICD-10-CM | POA: Diagnosis not present

## 2015-10-27 DIAGNOSIS — I10 Essential (primary) hypertension: Secondary | ICD-10-CM | POA: Diagnosis not present

## 2015-10-27 DIAGNOSIS — K219 Gastro-esophageal reflux disease without esophagitis: Secondary | ICD-10-CM

## 2015-10-27 NOTE — Patient Instructions (Signed)

## 2015-10-27 NOTE — Progress Notes (Signed)
Patient ID: Angelica Kelley, female   DOB: [birth year], 70 y.o.   MRN: [id number]  [facility]                       Unk Pinto, M.D.        Uvaldo Bristle. Silverio Lay, P.A.-C       Starlyn Skeans, P.A.-C   [facility]                [address] [place] [id number] Telephone [phone] Telefax [phone] _________________________________________________________________________     This very nice 70 y.o. [name] presents for 6 month follow up with Hypertension, Hyperlipidemia, Pre-Diabetes and Vitamin D Deficiency. Patient also has hx/o Bipolar Depression and is follow by Dr Chucky May.      Patient is treated for HTN circa 1995 & BP has been controlled at home. Today's BP: 112/68 mmHg. Patient has had no complaints of any cardiac type chest pain, palpitations, dyspnea/orthopnea/PND, dizziness, claudication, or dependent edema.     Hyperlipidemia is controlled with diet & meds. Patient denies myalgias or other med SE's. Last Lipids were at goal with Cholesterol 179; HDL 61; LDL 99; Triglycerides 97 on 07/27/2015.     Also, the patient has history of PreDiabetes with A1c 6.0% in 2012 and has had no symptoms of reactive hypoglycemia, diabetic polys, paresthesias or visual blurring.  Last A1c was at goal with A1c  5.5% on 07/27/2015.     Further, the patient also has history of Vitamin D Deficiency of "13" in 2010 and supplements vitamin D without any suspected side-effects. Last vitamin D was 91 on 07/27/2015.  Medication Sig  . ALPRAZolam 1 MG Take 1 mg by mouth 3 (three) times daily as needed. For anxiety.  Marland Kitchen aspirin EC 81 MG Take 81 mg by mouth daily.  . bisoprolol-hctz 5-6.25 MG  take 1 tablet by mouth once daily for BLOOD PRESSURE AND FLUID  . buPROPion-XL 150 MG  Take 450 mg by mouth daily.  Marland Kitchen VITAMIN D 5000 UNITS Take 5,000 Units by mouth daily.   Marland Kitchen esomeprazole  40 MG take 1 capsule by mouth once  daily at noon  . gabapentin  300 MG  take 1 to 2 capsules by mouth at bedtime  . hctz 12.5 MG  take 1 capsule by mouth once daily for BLOOD PRESSURE / FLUID.  Marland Kitchen LAMICTAL 200 MG    . rosuvastatin 40 MG  take 1/2 to 1 tablet by mouth once daily  . sertraline  100 MG   . traMADol  50 MG  Take 1/2 tab every 8 hours if needed for severe pain   Allergies  Allergen Reactions  . Lipitor [Atorvastatin] Fatigue  . Prednisone Change in mental status   PMHx:   Past Medical History  Diagnosis Date  . Hypertension   . Hyperlipidemia   . GERD (gastroesophageal reflux disease)   . Anxiety   . Depression   . Alcoholism ([place])   . IBS (irritable bowel syndrome)   . Elevated hemoglobin A1c   . Fracture of right wrist   . Heart murmur    Immunization History  Administered Date(s) Administered  . DT 07/27/2015  . Influenza, High Dose Seasonal PF 01/28/2014  . Influenza-Unspecified 03/15/2011, 01/04/2015  . Pneumococcal Conjugate-13 06/09/2014  . Pneumococcal-Unspecified 05/14/2008  . Td 05/14/2005  . Zoster  01/23/2012   Past Surgical History  Procedure Date  . Neck surgeryRuptured disc Remote   . Abdominal hysterectomy 1991  . Breast Biopsy, Left 1989  . Tonsillectomy 1965  . Cervical laminectomy 1977  . Appendectomy   . Tubal ligation   . Orif wrist fracture  - Dorna Leitz, MD 10/18/2014  . R Carpal tunnel release - Dorna Leitz, MD 10/18/2014   FHx:    Reviewed / unchanged  SHx:    Reviewed / unchanged  Systems Review:  Constitutional: Denies fever, chills, wt changes, headaches, insomnia, fatigue, night sweats, change in appetite. Eyes: Denies redness, blurred vision, diplopia, discharge, itchy, watery eyes.  ENT: Denies discharge, congestion, post nasal drip, epistaxis, sore throat, earache, hearing loss, dental pain, tinnitus, vertigo, sinus pain, snoring.  CV: Denies chest pain, palpitations, irregular heartbeat, syncope, dyspnea, diaphoresis, orthopnea, PND, claudication or  edema. Respiratory: denies cough, dyspnea, DOE, pleurisy, hoarseness, laryngitis, wheezing.  Gastrointestinal: Denies dysphagia, odynophagia, heartburn, reflux, water brash, abdominal pain or cramps, nausea, vomiting, bloating, diarrhea, constipation, hematemesis, melena, hematochezia  or hemorrhoids. Genitourinary: Denies dysuria, frequency, urgency, nocturia, hesitancy, discharge, hematuria or flank pain. Musculoskeletal: Denies arthralgias, myalgias, stiffness, jt. swelling, pain, limping or strain/sprain.  Skin: Denies pruritus, rash, hives, warts, acne, eczema or change in skin lesion(s). Neuro: No weakness, tremor, incoordination, spasms, paresthesia or pain. Psychiatric: Denies confusion, memory loss or sensory loss. Endo: Denies change in weight, skin or hair change.  Heme/Lymph: No excessive bleeding, bruising or enlarged lymph nodes.  Physical Exam  BP 112/68 mmHg  Pulse 52  Temp(Src) 97.1 F (36.2 C)  Resp 16  Ht 5' 4.5" (1.638 m)  Wt 156 lb 6.4 oz (70.943 kg)  BMI 26.44 kg/m2  Appears well nourished and in no distress.  Eyes: PERRLA, EOMs, conjunctiva no swelling or erythema. Sinuses: No frontal/maxillary tenderness ENT/Mouth: EAC's clear, TM's nl w/o erythema, bulging. Nares clear w/o erythema, swelling, exudates. Oropharynx clear without erythema or exudates. Oral hygiene is good. Tongue normal, non obstructing. Hearing intact.  Neck: Supple. Thyroid nl. Car 2+/2+ without bruits, nodes or JVD. Chest: Respirations nl with BS clear & equal w/o rales, rhonchi, wheezing or stridor.  Cor: Heart sounds normal w/ regular rate and rhythm without sig. murmurs, gallops, clicks, or rubs. Peripheral pulses normal and equal  without edema.  Abdomen: Soft & bowel sounds normal. Non-tender w/o guarding, rebound, hernias, masses, or organomegaly.  Lymphatics: Unremarkable.  Musculoskeletal: Full ROM all peripheral extremities, joint stability, 5/5 strength, and normal gait.  Skin:  Warm, dry without exposed rashes, lesions or ecchymosis apparent.  Neuro: Cranial nerves intact, reflexes equal bilaterally. Sensory-motor testing grossly intact. Tendon reflexes grossly intact.  Pysch: Alert & oriented x 3.  Insight and judgement nl & appropriate. No ideations.  Assessment and Plan:  1. Essential hypertension  - TSH  2. Hyperlipidemia  - Lipid panel - TSH  3. PreDiabetes  - Hemoglobin A1c - Insulin, random  4. Vitamin D deficiency  - VITAMIN D 25 Hydroxy   5. Gastroesophageal reflux disease   6. Bipolar I disorder, most recent episode depressed (HCC)  - Lamotrigine level  7. Medication management  - CBC with Differential/Platelet - BASIC METABOLIC PANEL WITH GFR - Hepatic function panel - Magnesium - Lamotrigine level   Recommended regular exercise, BP monitoring, weight control, and discussed med and SE's. Recommended labs to assess and monitor clinical status. Further disposition pending results of labs. Over 30 minutes of exam, counseling, chart review was performed

## 2015-11-05 ENCOUNTER — Encounter (HOSPITAL_COMMUNITY): Payer: Self-pay | Admitting: Emergency Medicine

## 2015-11-05 ENCOUNTER — Emergency Department (HOSPITAL_COMMUNITY): Payer: Medicare Other

## 2015-11-05 ENCOUNTER — Emergency Department (HOSPITAL_COMMUNITY)
Admission: EM | Admit: 2015-11-05 | Discharge: 2015-11-05 | Disposition: A | Discharge status: Home / Self Care | Payer: Medicare Other | Attending: Emergency Medicine | Admitting: Emergency Medicine

## 2015-11-05 DIAGNOSIS — Z9114 Patient's other noncompliance with medication regimen: Secondary | ICD-10-CM | POA: Diagnosis not present

## 2015-11-05 DIAGNOSIS — Y939 Activity, unspecified: Secondary | ICD-10-CM | POA: Insufficient documentation

## 2015-11-05 DIAGNOSIS — W19XXXA Unspecified fall, initial encounter: Secondary | ICD-10-CM | POA: Insufficient documentation

## 2015-11-05 DIAGNOSIS — R4182 Altered mental status, unspecified: Secondary | ICD-10-CM | POA: Diagnosis not present

## 2015-11-05 DIAGNOSIS — Y999 Unspecified external cause status: Secondary | ICD-10-CM | POA: Insufficient documentation

## 2015-11-05 DIAGNOSIS — I10 Essential (primary) hypertension: Secondary | ICD-10-CM | POA: Insufficient documentation

## 2015-11-05 DIAGNOSIS — S199XXA Unspecified injury of neck, initial encounter: Secondary | ICD-10-CM | POA: Diagnosis not present

## 2015-11-05 DIAGNOSIS — Y92009 Unspecified place in unspecified non-institutional (private) residence as the place of occurrence of the external cause: Secondary | ICD-10-CM | POA: Insufficient documentation

## 2015-11-05 DIAGNOSIS — E785 Hyperlipidemia, unspecified: Secondary | ICD-10-CM | POA: Insufficient documentation

## 2015-11-05 DIAGNOSIS — Z79899 Other long term (current) drug therapy: Secondary | ICD-10-CM | POA: Insufficient documentation

## 2015-11-05 DIAGNOSIS — T148 Other injury of unspecified body region: Secondary | ICD-10-CM | POA: Diagnosis not present

## 2015-11-05 DIAGNOSIS — Z7982 Long term (current) use of aspirin: Secondary | ICD-10-CM | POA: Insufficient documentation

## 2015-11-05 DIAGNOSIS — M546 Pain in thoracic spine: Secondary | ICD-10-CM | POA: Diagnosis not present

## 2015-11-05 DIAGNOSIS — Z9119 Patient's noncompliance with other medical treatment and regimen: Secondary | ICD-10-CM | POA: Diagnosis not present

## 2015-11-05 DIAGNOSIS — S0990XA Unspecified injury of head, initial encounter: Secondary | ICD-10-CM | POA: Diagnosis not present

## 2015-11-05 DIAGNOSIS — M542 Cervicalgia: Secondary | ICD-10-CM | POA: Diagnosis not present

## 2015-11-05 DIAGNOSIS — F329 Major depressive disorder, single episode, unspecified: Secondary | ICD-10-CM | POA: Insufficient documentation

## 2015-11-05 DIAGNOSIS — R51 Headache: Secondary | ICD-10-CM | POA: Diagnosis not present

## 2015-11-05 DIAGNOSIS — R404 Transient alteration of awareness: Secondary | ICD-10-CM | POA: Diagnosis not present

## 2015-11-05 LAB — CBC WITH DIFFERENTIAL/PLATELET
BASOS ABS: 0 10*3/uL (ref 0.0–0.1)
BASOS PCT: 1 %
Eosinophils Absolute: 0.1 10*3/uL (ref 0.0–0.7)
Eosinophils Relative: 2 %
HEMATOCRIT: 36.8 % (ref 36.0–46.0)
HEMOGLOBIN: 12.5 g/dL (ref 12.0–15.0)
Lymphocytes Relative: 16 %
Lymphs Abs: 1 10*3/uL (ref 0.7–4.0)
MCH: 30 pg (ref 26.0–34.0)
MCHC: 34 g/dL (ref 30.0–36.0)
MCV: 88.5 fL (ref 78.0–100.0)
Monocytes Absolute: 0.5 10*3/uL (ref 0.1–1.0)
Monocytes Relative: 8 %
NEUTROS ABS: 4.3 10*3/uL (ref 1.7–7.7)
NEUTROS PCT: 73 %
Platelets: 148 10*3/uL — ABNORMAL LOW (ref 150–400)
RBC: 4.16 MIL/uL (ref 3.87–5.11)
RDW: 12.6 % (ref 11.5–15.5)
WBC: 6 10*3/uL (ref 4.0–10.5)

## 2015-11-05 LAB — COMPREHENSIVE METABOLIC PANEL
ALK PHOS: 43 U/L (ref 38–126)
ALT: 15 U/L (ref 14–54)
ANION GAP: 7 (ref 5–15)
AST: 29 U/L (ref 15–41)
Albumin: 4.3 g/dL (ref 3.5–5.0)
BILIRUBIN TOTAL: 1 mg/dL (ref 0.3–1.2)
BUN: 27 mg/dL — ABNORMAL HIGH (ref 6–20)
CALCIUM: 9.5 mg/dL (ref 8.9–10.3)
CO2: 29 mmol/L (ref 22–32)
Chloride: 104 mmol/L (ref 101–111)
Creatinine, Ser: 0.9 mg/dL (ref 0.44–1.00)
GFR calc non Af Amer: >60 mL/min (ref >60)
Glucose, Bld: 104 mg/dL — ABNORMAL HIGH (ref 65–99)
Potassium: 4.5 mmol/L (ref 3.5–5.1)
SODIUM: 140 mmol/L (ref 135–145)
TOTAL PROTEIN: 6.7 g/dL (ref 6.5–8.1)

## 2015-11-05 LAB — RAPID URINE DRUG SCREEN, HOSP PERFORMED
Amphetamines: NOT DETECTED
BARBITURATES: NOT DETECTED
BENZODIAZEPINES: POSITIVE — AB
COCAINE: NOT DETECTED
Opiates: NOT DETECTED
TETRAHYDROCANNABINOL: NOT DETECTED

## 2015-11-05 LAB — URINALYSIS, ROUTINE W REFLEX MICROSCOPIC
Bilirubin Urine: NEGATIVE
Glucose, UA: NEGATIVE mg/dL
Hgb urine dipstick: NEGATIVE
Ketones, ur: NEGATIVE mg/dL
LEUKOCYTES UA: NEGATIVE
NITRITE: NEGATIVE
PH: 6 (ref 5.0–8.0)
Protein, ur: NEGATIVE mg/dL
SPECIFIC GRAVITY, URINE: 1.014 (ref 1.005–1.030)

## 2015-11-05 LAB — I-STAT TROPONIN, ED: Troponin i, poc: 0 ng/mL (ref 0.00–0.08)

## 2015-11-05 LAB — ETHANOL: Alcohol, Ethyl (B): <5 mg/dL (ref <5)

## 2015-11-05 MED ORDER — SODIUM CHLORIDE 0.9 % IV BOLUS (SEPSIS)
500.0000 mL | Freq: Once | INTRAVENOUS | Status: AC
  Administered 2015-11-05: 500 mL via INTRAVENOUS

## 2015-11-05 NOTE — Discharge Instructions (Signed)
We suspect that your fall and change in mental status was related to abuse of your medications. Please have family members control these medications and follow up closely with primary care. Your work up here was reassuring.   Fall Prevention in the Home  Falls can cause injuries and can affect people from all age groups. There are many simple things that you can do to make your home safe and to help prevent falls. WHAT CAN I DO ON THE OUTSIDE OF MY HOME?  Regularly repair the edges of walkways and driveways and fix any cracks.  Remove high doorway thresholds.  Trim any shrubbery on the main path into your home.  Use bright outdoor lighting.  Clear walkways of debris and clutter, including tools and rocks.  Regularly check that handrails are securely fastened and in good repair. Both sides of any steps should have handrails.  Install guardrails along the edges of any raised decks or porches.  Have leaves, snow, and ice cleared regularly.  Use sand or salt on walkways during winter months.  In the garage, clean up any spills right away, including grease or oil spills. WHAT CAN I DO IN THE BATHROOM?  Use night lights.  Install grab bars by the toilet and in the tub and shower. Do not use towel bars as grab bars.  Use non-skid mats or decals on the floor of the tub or shower.  If you need to sit down while you are in the shower, use a plastic, non-slip stool.Marland Kitchen  Keep the floor dry. Immediately clean up any water that spills on the floor.  Remove soap buildup in the tub or shower on a regular basis.  Attach bath mats securely with double-sided non-slip rug tape.  Remove throw rugs and other tripping hazards from the floor. WHAT CAN I DO IN THE BEDROOM?  Use night lights.  Make sure that a bedside light is easy to reach.  Do not use oversized bedding that drapes onto the floor.  Have a firm chair that has side arms to use for getting dressed.  Remove throw rugs and other  tripping hazards from the floor. WHAT CAN I DO IN THE KITCHEN?   Clean up any spills right away.  Avoid walking on wet floors.  Place frequently used items in easy-to-reach places.  If you need to reach for something above you, use a sturdy step stool that has a grab bar.  Keep electrical cables out of the way.  Do not use floor polish or wax that makes floors slippery. If you have to use wax, make sure that it is non-skid floor wax.  Remove throw rugs and other tripping hazards from the floor. WHAT CAN I DO IN THE STAIRWAYS?  Do not leave any items on the stairs.  Make sure that there are handrails on both sides of the stairs. Fix handrails that are broken or loose. Make sure that handrails are as long as the stairways.  Check any carpeting to make sure that it is firmly attached to the stairs. Fix any carpet that is loose or worn.  Avoid having throw rugs at the top or bottom of stairways, or secure the rugs with carpet tape to prevent them from moving.  Make sure that you have a light switch at the top of the stairs and the bottom of the stairs. If you do not have them, have them installed. WHAT ARE SOME OTHER FALL PREVENTION TIPS?  Wear closed-toe shoes that fit well and  support your feet. Wear shoes that have rubber soles or low heels.  When you use a stepladder, make sure that it is completely opened and that the sides are firmly locked. Have someone hold the ladder while you are using it. Do not climb a closed stepladder.  Add color or contrast paint or tape to grab bars and handrails in your home. Place contrasting color strips on the first and last steps.  Use mobility aids as needed, such as canes, walkers, scooters, and crutches.  Turn on lights if it is dark. Replace any light bulbs that burn out.  Set up furniture so that there are clear paths. Keep the furniture in the same spot.  Fix any uneven floor surfaces.  Choose a carpet design that does not hide the  edge of steps of a stairway.  Be aware of any and all pets.  Review your medicines with your healthcare provider. Some medicines can cause dizziness or changes in blood pressure, which increase your risk of falling. Talk with your health care provider about other ways that you can decrease your risk of falls. This may include working with a physical therapist or trainer to improve your strength, balance, and endurance.   This information is not intended to replace advice given to you by your health care provider. Make sure you discuss any questions you have with your health care provider.   Document Released: 04/20/2002 Document Revised: 09/14/2014 Document Reviewed: 06/04/2014 Elsevier Interactive Patient Education [employer].

## 2015-11-05 NOTE — ED Provider Notes (Signed)
CSN: [id number]     Arrival date & time 11/05/15  1646 History   First MD Initiated Contact with Patient 11/05/15 1654     Chief Complaint  Patient presents with  . Fall  . Altered Mental Status   Level V caveat: AMS.   Angelica Kelley is a 70 y.o. female who presents to the ED via EMS with AMS. Patient was found lying outside of her house door by neighbors. Neighbors noticed her car door was open and went out to find her and noticed that she was lying out side of her house door. They think she was on the ground for about an hour. He reports she is normally alert and oriented 3. She has a history of multiple falls. She also has a history of Xanax and alcohol abuse. Patient reports she was going to baby shower and fell. She is unable to provide further details. She reports she had some pain to the left side of her head but this is since resolved. Currently she has no complaints. She denies fevers, chest pain, shortness of breath, numbness, tingling, weakness or vision changes.    Patient is a 70 y.o. female presenting with fall and altered mental status. The history is provided by the patient, the EMS personnel and medical records. No language interpreter was used.  Fall Pertinent negatives include no abdominal pain, coughing, fever or rash.  Altered Mental Status Associated symptoms: no abdominal pain, no fever and no rash     Past Medical History  Diagnosis Date  . Hypertension   . Hyperlipidemia   . GERD (gastroesophageal reflux disease)   . Anxiety   . Depression   . Alcoholism ([place])   . IBS (irritable bowel syndrome)   . Elevated hemoglobin A1c   . Fracture of right wrist   . Heart murmur    Past Surgical History  Procedure Laterality Date  . Neck surgery  Remote     Ruptured disc, per patient. Got infected.   . Abdominal hysterectomy  1991  . Breast surgery Left 1989    [facility]  . Tonsillectomy  1965  . Cervical laminectomy  1977  . Appendectomy    . Tubal ligation    .  Orif wrist fracture Right 10/18/2014    Procedure: OPEN REDUCTION INTERNAL FIXATION (ORIF) RIGHT WRIST FRACTURE;  Surgeon: Dorna Leitz, MD;  Location: [facility];  Service: Orthopedics;  Laterality: Right;  . Carpal tunnel release Right 10/18/2014    Procedure: CARPAL TUNNEL RELEASE;  Surgeon: Dorna Leitz, MD;  Location: [facility];  Service: Orthopedics;  Laterality: Right;   Family History  Problem Relation Age of Onset  . Heart disease Mother   . Diabetes Mother   . Leukemia Father   . Heart disease Brother   . Cancer Brother   . Parkinson's disease Brother    Social History  Substance Use Topics  . Smoking status: Never Smoker   . Smokeless tobacco: Never Used  . Alcohol Use: No   OB History    No data available     Review of Systems  Unable to perform ROS: Mental status change  Constitutional: Negative for fever.  Eyes: Negative for visual disturbance.  Respiratory: Negative for cough and shortness of breath.   Gastrointestinal: Negative for abdominal pain.  Skin: Negative for rash.  Neurological: Negative for syncope.      Allergies  Lipitor and Prednisone  Home Medications   Prior to Admission medications   Medication Sig Start  Date End Date Taking? Authorizing Provider  ALPRAZolam Duanne Moron) 1 MG tablet Take 1 mg by mouth 3 (three) times daily as needed. For anxiety.   Yes Historical Provider, MD  aspirin EC 81 MG tablet Take 81 mg by mouth daily.   Yes Historical Provider, MD  bisoprolol-hydrochlorothiazide [facility]) 5-6.25 MG tablet take 1 tablet by mouth once daily for BLOOD PRESSURE AND FLUID 03/02/15  Yes Unk Pinto, MD  esomeprazole ([place]) 40 MG capsule take 1 capsule by mouth once daily at noon 10/06/15  Yes Unk Pinto, MD  gabapentin (NEURONTIN) 300 MG capsule take 1 to 2 capsules by mouth at bedtime 10/18/15  Yes Unk Pinto, MD  hydrochlorothiazide (MICROZIDE) 12.5 MG capsule take 1 capsule by mouth once daily for BLOOD PRESSURE / FLUID. 03/24/15  Yes  Unk Pinto, MD  rosuvastatin (CRESTOR) 40 MG tablet take 1/2 to 1 tablet by mouth once daily 08/29/15  Yes Unk Pinto, MD  traMADol Veatrice Bourbon) 50 MG tablet Take 1/2 tab every 8 hours if needed for severe pain 10/17/15 11/16/15 Yes Unk Pinto, MD  buPROPion (WELLBUTRIN XL) 150 MG 24 hr tablet Take 450 mg by mouth daily.    Historical Provider, MD  Cholecalciferol (VITAMIN D3) 5000 UNITS CAPS Take 5,000 Units by mouth daily.     Historical Provider, MD  diptheria-tetanus toxoids [facility]) 2-2 LF/0.5ML injection  07/27/15   Historical Provider, MD  lamoTRIgine (LAMICTAL) 200 MG tablet  04/21/15   Historical Provider, MD  sertraline (ZOLOFT) 100 MG tablet  04/11/15   Historical Provider, MD   BP 147/70 mmHg  Pulse 61  Temp(Src) 97.8 F (36.6 C)  Resp 20  SpO2 98% Physical Exam  Constitutional: She appears well-developed and well-nourished. No distress.  Nontoxic appearing.  HENT:  Head: Normocephalic and atraumatic.  Right Ear: External ear normal.  Left Ear: External ear normal.  Mouth/Throat: Oropharynx is clear and moist.  No visible evidence of head trauma. No crepitus. Bilateral tympanic membranes are pearly-gray without erythema or loss of landmarks.   Eyes: Conjunctivae are normal. Pupils are equal, round, and reactive to light. Right eye exhibits no discharge. Left eye exhibits no discharge.  Neck: Normal range of motion. Neck supple. No JVD present.  Cardiovascular: Normal rate, regular rhythm, normal heart sounds and intact distal pulses.   Pulmonary/Chest: Effort normal and breath sounds normal. No stridor. No respiratory distress. She has no wheezes. She has no rales.  Abdominal: Soft. There is no tenderness.  Musculoskeletal: She exhibits no edema or tenderness.  No midline neck or back tenderness. Patient's bilateral shoulder, elbow, wrist, hip, knee and ankle joints are supple and nontender to palpation. Patient is spontaneously moving all extremities in a coordinated  fashion exhibiting good strength.   Lymphadenopathy:    She has no cervical adenopathy.  Neurological: She is alert. Coordination normal.  Patient is alert and oriented to person and place but confused to time and event. She has slowing with finger to nose. No pronator drift. She has good strength in her bilateral upper and lower extremities. Good grip strengths. No facial droop. Speech is clear and coherent.  Skin: Skin is warm and dry. No rash noted. She is not diaphoretic. No erythema. No pallor.  Psychiatric: She has a normal mood and affect. Her behavior is normal.  Nursing note and vitals reviewed.   ED Course  Procedures (including critical care time) Labs Review Labs Reviewed  COMPREHENSIVE METABOLIC PANEL - Abnormal; Notable for the following:    Glucose, Bld 104 (*)  BUN 27 (*)    All other components within normal limits  CBC WITH DIFFERENTIAL/PLATELET - Abnormal; Notable for the following:    Platelets 148 (*)    All other components within normal limits  URINE RAPID DRUG SCREEN, HOSP PERFORMED - Abnormal; Notable for the following:    Benzodiazepines POSITIVE (*)    All other components within normal limits  ETHANOL  URINALYSIS, ROUTINE W REFLEX MICROSCOPIC (NOT AT [facility])  Randolm Idol, ED    Imaging Review Dg Chest 2 View  11/05/2015  CLINICAL DATA:  Altered mental status, unwitnessed fall at home today in driveway, history hypertension EXAM: CHEST  2 VIEW COMPARISON:  09/23/2013 FINDINGS: Enlargement of cardiac silhouette with pulmonary vascular congestion. Atherosclerotic calcification aorta. Eventration RIGHT diaphragm stable. Bronchitic changes without infiltrate, pleural effusion or pneumothorax. Diffuse osseous demineralization. IMPRESSION: Enlargement of cardiac silhouette. Chronic bronchitic changes without infiltrate. Electronically Signed   By: Lavonia Dana M.D.   On: 11/05/2015 17:36   Ct Head Wo Contrast  11/05/2015  CLINICAL DATA:  Pain after fall.  EXAM: CT HEAD WITHOUT CONTRAST CT CERVICAL SPINE WITHOUT CONTRAST TECHNIQUE: Multidetector CT imaging of the head and cervical spine was performed following the standard protocol without intravenous contrast. Multiplanar CT image reconstructions of the cervical spine were also generated. COMPARISON:  April 23rd 2013 and Sep 23, 2013 FINDINGS: CT HEAD FINDINGS Paranasal sinuses, mastoid air cells, and bones are within normal limits. Mild soft tissue swelling over the right forehead. Extracranial soft tissues including the orbits are otherwise within normal limits. No subdural, epidural, or subarachnoid hemorrhage. The cerebellum, brainstem, and basal cisterns are normal. Ventricles and sulci are mildly prominent but stable. No acute cortical ischemia or infarct. No mass, mass effect, or midline shift. CT CERVICAL SPINE FINDINGS There is 2 mm of anterior listhesis of C7 versus T1 which is stable when compared to 2013. There is mild reversal of normal lordosis centered at C5 which is also stable. No acute traumatic malalignment identified. Multilevel degenerative disc disease with anterior and small posterior osteophytes. Facet degenerative changes are also seen as well. No fractures. IMPRESSION: 1. No acute intracranial abnormality. 2. Degenerative changes in the spine with no fracture or traumatic malalignment Electronically Signed   By: Dorise Bullion III M.D   On: 11/05/2015 17:59   Ct Cervical Spine Wo Contrast  11/05/2015  CLINICAL DATA:  Pain after fall. EXAM: CT HEAD WITHOUT CONTRAST CT CERVICAL SPINE WITHOUT CONTRAST TECHNIQUE: Multidetector CT imaging of the head and cervical spine was performed following the standard protocol without intravenous contrast. Multiplanar CT image reconstructions of the cervical spine were also generated. COMPARISON:  April 23rd 2013 and Sep 23, 2013 FINDINGS: CT HEAD FINDINGS Paranasal sinuses, mastoid air cells, and bones are within normal limits. Mild soft tissue swelling  over the right forehead. Extracranial soft tissues including the orbits are otherwise within normal limits. No subdural, epidural, or subarachnoid hemorrhage. The cerebellum, brainstem, and basal cisterns are normal. Ventricles and sulci are mildly prominent but stable. No acute cortical ischemia or infarct. No mass, mass effect, or midline shift. CT CERVICAL SPINE FINDINGS There is 2 mm of anterior listhesis of C7 versus T1 which is stable when compared to 2013. There is mild reversal of normal lordosis centered at C5 which is also stable. No acute traumatic malalignment identified. Multilevel degenerative disc disease with anterior and small posterior osteophytes. Facet degenerative changes are also seen as well. No fractures. IMPRESSION: 1. No acute intracranial abnormality. 2. Degenerative changes in  the spine with no fracture or traumatic malalignment Electronically Signed   By: Dorise Bullion III M.D   On: 11/05/2015 17:59   I have personally reviewed and evaluated these images and lab results as part of my medical decision-making.   EKG Interpretation None      Filed Vitals:   11/05/15 1937 11/05/15 1951  BP: 147/70   Pulse: 61   Temp:  97.8 F (36.6 C)  Resp: 20   SpO2: 98%      MDM   Meds given in ED:  Medications  sodium chloride 0.9 % bolus 500 mL (0 mLs Intravenous Stopped 11/05/15 1908)    New Prescriptions   No medications on file    Final diagnoses:  Transient alteration of awareness  Fall, initial encounter  Noncompliance with medication treatment due to abuse of medication   This  is a 70 y.o. female who presents to the ED via EMS with AMS. Patient was found lying outside of her house door by neighbors. Neighbors noticed her car door was open and went out to find her and noticed that she was lying out side of her house door. They think she was on the ground for about an hour. He reports she is normally alert and oriented 3. She has a history of multiple falls. She  also has a history of Xanax and alcohol abuse. Patient reports she was going to baby shower and fell. She is unable to provide further details. She reports she had some pain to the left side of her head but this is since resolved. Currently she has no complaints. On exam the patient is afebrile and nontoxic appearing. The patient has no visible evidence of injury to her body. She is alert and oriented to person and confused to place, time and event. Urinalysis shows no signs of infection. Urine rapid drug screen is positive for benzodiazepines. CMP is unremarkable. Ethanol level is 0. CBC is unremarkable. Troponin is 0. Chest x-ray shows enlargement of cardiac silhouette. Chronic bronchitic changes without infiltrate. CT head and neck shows no acute abnormality. Later, reevaluation family is at bedside. They report she is now acting at baseline. Patient is now alert and oriented 4. She has no complaints. She is eating crackers without difficulty. She tells [place] that she takes "right much" of her Klonopin. We suspect this caused a transient alteration of her awareness. She is now acting at baseline. We discussed that this is dangerous and we would like family members to control her Klonopin. He suggested she follow-up with her primary care provider to possibly create a plan to taper her off of  Benzodiazepines. Family agrees to help with medication. Family and patient are in agreement with discharge. Will discharge with strict return precautions. I advised the patient to follow-up with their primary care provider this week. I advised the patient to return to the emergency department with new or worsening symptoms or new concerns. The patient and family verbalized understanding and agreement with plan.    This patient was discussed with and evaluated by Dr. Laneta Simmers who agrees with assessment and plan.    Waynetta Pean, PA-C 11/05/15 2026  Leo Grosser, MD 11/07/15 [phone]

## 2015-11-05 NOTE — ED Notes (Signed)
Bed: [facility]01 Expected date: 11/05/15 Expected time:  Means of arrival:  Comments:

## 2015-11-05 NOTE — ED Notes (Signed)
Pt taken to bathroom in steady

## 2015-11-05 NOTE — ED Notes (Signed)
Pt had difficulty ambulating and was unable to do so without assistance. Per EDP, pt will be observed for another hour

## 2015-11-05 NOTE — ED Notes (Signed)
Pt from home following a fall. Pt's neighbors noticed her car door open and repeatedly called her house with no answer. Pt was found on the ground after having tripped on a lip on her front porch step. Pt has a history of multiple falls. Pt is a & o x 4 at baseline and is not on blood thinners. Pt has a hx of taking "too many of her xanax" per neighbors. Pt has complaints of lower back/buttock pain. Pt's neighbors found her in left lateral fetal position. Pt states she hit her head. Pt is having difficulty following directions, but is alert and oriented to person and place and situation but not time.

## 2015-11-05 NOTE — ED Notes (Signed)
RN starting IV, drawing labs 

## 2015-11-05 NOTE — ED Notes (Signed)
Pt was assisted in ambulating. Pt's gait was much more steady.

## 2015-11-05 NOTE — ED Notes (Signed)
Bed: [facility]01 Expected date:  Expected time:  Means of arrival:  Comments: EMS- 70 yo fall; AMS, no injury

## 2015-12-01 ENCOUNTER — Other Ambulatory Visit: Payer: Self-pay | Admitting: Internal Medicine

## 2016-02-01 ENCOUNTER — Encounter: Payer: Self-pay | Admitting: Physician Assistant

## 2016-02-01 ENCOUNTER — Ambulatory Visit (INDEPENDENT_AMBULATORY_CARE_PROVIDER_SITE_OTHER): Payer: Medicare Other | Admitting: Physician Assistant

## 2016-02-01 VITALS — BP 140/90 | HR 76 | Temp 97.5°F | Resp 16 | Ht 64.5 in | Wt 148.8 lb

## 2016-02-01 DIAGNOSIS — I10 Essential (primary) hypertension: Secondary | ICD-10-CM

## 2016-02-01 DIAGNOSIS — R7309 Other abnormal glucose: Secondary | ICD-10-CM | POA: Diagnosis not present

## 2016-02-01 DIAGNOSIS — Z79899 Other long term (current) drug therapy: Secondary | ICD-10-CM

## 2016-02-01 DIAGNOSIS — E785 Hyperlipidemia, unspecified: Secondary | ICD-10-CM | POA: Diagnosis not present

## 2016-02-01 DIAGNOSIS — E559 Vitamin D deficiency, unspecified: Secondary | ICD-10-CM | POA: Diagnosis not present

## 2016-02-01 DIAGNOSIS — F1020 Alcohol dependence, uncomplicated: Secondary | ICD-10-CM | POA: Diagnosis not present

## 2016-02-01 DIAGNOSIS — T424X1S Poisoning by benzodiazepines, accidental (unintentional), sequela: Secondary | ICD-10-CM

## 2016-02-01 DIAGNOSIS — F3341 Major depressive disorder, recurrent, in partial remission: Secondary | ICD-10-CM | POA: Diagnosis not present

## 2016-02-01 LAB — BASIC METABOLIC PANEL WITH GFR
BUN: 19 mg/dL (ref 7–25)
CALCIUM: 9.4 mg/dL (ref 8.6–10.4)
CO2: 29 mmol/L (ref 20–31)
Chloride: 103 mmol/L (ref 98–110)
Creat: 0.9 mg/dL (ref 0.60–0.93)
GFR, EST AFRICAN AMERICAN: 74 mL/min (ref >=60)
GFR, EST NON AFRICAN AMERICAN: 64 mL/min (ref >=60)
Glucose, Bld: 92 mg/dL (ref 65–99)
Potassium: 4.2 mmol/L (ref 3.5–5.3)
SODIUM: 142 mmol/L (ref 135–146)

## 2016-02-01 LAB — CBC WITH DIFFERENTIAL/PLATELET
BASOS ABS: 61 {cells}/uL (ref 0–200)
Basophils Relative: 1 %
EOS PCT: 4 %
Eosinophils Absolute: 244 cells/uL (ref 15–500)
HCT: 39.2 % (ref 35.0–45.0)
Hemoglobin: 12.7 g/dL (ref 11.7–15.5)
LYMPHS ABS: 1037 {cells}/uL (ref 850–3900)
LYMPHS PCT: 17 %
MCH: 28.7 pg (ref 27.0–33.0)
MCHC: 32.4 g/dL (ref 32.0–36.0)
MCV: 88.5 fL (ref 80.0–100.0)
MONOS PCT: 8 %
MPV: 10.7 fL (ref 7.5–12.5)
Monocytes Absolute: 488 cells/uL (ref 200–950)
NEUTROS PCT: 70 %
Neutro Abs: 4270 cells/uL (ref 1500–7800)
PLATELETS: 162 10*3/uL (ref 140–400)
RBC: 4.43 MIL/uL (ref 3.80–5.10)
RDW: 13.2 % (ref 11.0–15.0)
WBC: 6.1 10*3/uL (ref 3.8–10.8)

## 2016-02-01 LAB — LIPID PANEL
CHOL/HDL RATIO: 2.7 ratio (ref <=5.0)
CHOLESTEROL: 169 mg/dL (ref 125–200)
HDL: 62 mg/dL (ref >=46)
LDL Cholesterol: 92 mg/dL (ref <130)
TRIGLYCERIDES: 74 mg/dL (ref <150)
VLDL: 15 mg/dL (ref <30)

## 2016-02-01 LAB — HEPATIC FUNCTION PANEL
ALT: 13 U/L (ref 6–29)
AST: 18 U/L (ref 10–35)
Albumin: 4.3 g/dL (ref 3.6–5.1)
Alkaline Phosphatase: 54 U/L (ref 33–130)
BILIRUBIN DIRECT: 0.1 mg/dL (ref <=0.2)
BILIRUBIN INDIRECT: 0.3 mg/dL (ref 0.2–1.2)
Total Bilirubin: 0.4 mg/dL (ref 0.2–1.2)
Total Protein: 6.4 g/dL (ref 6.1–8.1)

## 2016-02-01 LAB — TSH: TSH: 2.41 m[IU]/L

## 2016-02-01 LAB — MAGNESIUM: Magnesium: 2 mg/dL (ref 1.5–2.5)

## 2016-02-01 NOTE — Progress Notes (Signed)
Assessment and Plan:   Essential hypertension - continue medications, DASH diet, exercise and monitor at home. Call if greater than 130/80.  -     CBC with Differential/Platelet -     BASIC METABOLIC PANEL WITH GFR -     Hepatic function panel -     TSH  Hyperlipidemia -continue medications, check lipids, decrease fatty foods, increase activity.  -     Lipid panel  Recurrent major depressive disorder, in partial remission ([place]) - continue medications, especially with loss of Lattie Haw, her daughter sept first suggest counseling - has follow up with Dr. Toy Care Friday  Alcoholism [facility]) Denies use at this time  Other abnormal glucose -     Hemoglobin A1c  Vitamin D deficiency Continue supplement  Medication management -     Magnesium  Overdose of benzodiazepine, accidental or unintentional, sequela Has had multiple falls in the past, possible over use/over medication with xanax especially with alcoholism history.  Discussed over use, at first patient states only took 1.5 xanax a day, when confronted about monthly prescription of 120, states she take only 2.5 a day. We still discussed that she may not need 120 pills a month, that hoarding medication is a sign of abuse. She asked me several times to not send this note or the ER note to Dr. Toy Care but since she is not on epic we will fax them to her so she may better monitor her xanax use or potentially try to decrease use.  Patient encouraged to follow up with a counselor or hospice counseling.    Continue diet and meds as discussed. Further disposition pending results of labs. Over 30 minutes of exam, counseling, chart review, and critical decision making was performed  HPI 70 y.o. female  presents for 3 month follow up on hypertension, cholesterol, prediabetes, and vitamin D deficiency.   Her blood pressure has been controlled at home, today their BP is    She does not workout. She denies chest pain, shortness of breath, dizziness.  She  is on cholesterol medication, she is on crestor 40 and denies myalgias. Her cholesterol is at goal. The cholesterol last visit was:   Lab Results  Component Value Date   CHOL 179 07/27/2015   HDL 61 07/27/2015   LDLCALC 99 07/27/2015   TRIG 97 07/27/2015   CHOLHDL 2.9 07/27/2015    She has been working on diet and exercise for prediabetes, and denies paresthesia of the feet, polydipsia, polyuria and visual disturbances. Last A1C in the office was:  Lab Results  Component Value Date   HGBA1C 5.5 07/27/2015  Patient is on Vitamin D supplement.   Lab Results  Component Value Date   VD25OH 80 07/27/2015   She states the vesicare, myrebetiq, Lisbeth Ply and have not helped. Declines seeing a urologist.  She follows with Dr. Toy Care for depression, gets xanax from 120 a month, she has had multiple falls and AMS due to unintentional overdose. .   Current Medications:  Current Outpatient Prescriptions on File Prior to Visit  Medication Sig Dispense Refill  . ALPRAZolam (XANAX) 1 MG tablet Take 1 mg by mouth 3 (three) times daily as needed. For anxiety.    Marland Kitchen aspirin EC 81 MG tablet Take 81 mg by mouth daily.    . bisoprolol-hydrochlorothiazide (ZIAC) 5-6.25 MG tablet take 1 tablet by mouth once daily for blood pressure and FLUID 90 tablet 1  . buPROPion (WELLBUTRIN XL) 150 MG 24 hr tablet Take 450 mg by mouth  daily.    . Cholecalciferol (VITAMIN D3) 5000 UNITS CAPS Take 5,000 Units by mouth daily.     Marland Kitchen diptheria-tetanus toxoids [facility]) 2-2 LF/0.5ML injection     . esomeprazole (NEXIUM) 40 MG capsule take 1 capsule by mouth once daily at noon 30 capsule 3  . gabapentin (NEURONTIN) 300 MG capsule take 1 to 2 capsules by mouth at bedtime 60 capsule 2  . hydrochlorothiazide (MICROZIDE) 12.5 MG capsule take 1 capsule by mouth once daily for BLOOD PRESSURE / FLUID. 90 capsule 3  . lamoTRIgine (LAMICTAL) 200 MG tablet   0  . rosuvastatin (CRESTOR) 40 MG tablet take 1/2 to 1 tablet by mouth once daily 90  tablet 1  . sertraline (ZOLOFT) 100 MG tablet   0   No current facility-administered medications on file prior to visit.    Medical History:  Past Medical History:  Diagnosis Date  . Alcoholism ([place])   . Anxiety   . Depression   . Elevated hemoglobin A1c   . Fracture of right wrist   . GERD (gastroesophageal reflux disease)   . Heart murmur   . Hyperlipidemia   . Hypertension   . IBS (irritable bowel syndrome)    Allergies:  Allergies  Allergen Reactions  . Lipitor [Atorvastatin]     Fatigue  . Prednisone Other (See Comments)    Change in mental status    Review of Systems:  Review of Systems  Constitutional: Negative.   HENT: Negative.   Eyes: Negative.   Respiratory: Negative.   Cardiovascular: Negative.   Gastrointestinal: Negative.   Genitourinary: Positive for frequency and urgency. Negative for dysuria, flank pain and hematuria.  Musculoskeletal: Positive for back pain and joint pain. Negative for falls, myalgias and neck pain.  Skin: Negative.   Neurological: Negative.   Endo/Heme/Allergies: Negative.   Psychiatric/Behavioral: Negative.     Family history- Review and unchanged Social history- Review and unchanged Physical Exam: There were no vitals taken for this visit. Wt Readings from Last 3 Encounters:  10/27/15 156 lb 6.4 oz (70.9 kg)  09/22/15 155 lb (70.3 kg)  07/27/15 148 lb 6.4 oz (67.3 kg)   General Appearance: Well nourished, in no apparent distress. Eyes: PERRLA, EOMs, conjunctiva no swelling or erythema Sinuses: No Frontal/maxillary tenderness ENT/Mouth: Ext aud canals clear, TMs without erythema, bulging. No erythema, swelling, or exudate on post pharynx.  Tonsils not swollen or erythematous. Hearing normal.  Neck: Supple, thyroid normal.  Respiratory: Respiratory effort normal, BS equal bilaterally without rales, rhonchi, wheezing or stridor.  Cardio: RRR with systolic 3/6 murmur Brisk peripheral pulses with 1+ edema.  Abdomen: Soft, +  BS,  Non tender, no guarding, rebound, hernias, masses. Lymphatics: Non tender without lymphadenopathy.  Musculoskeletal: Full ROM, 5/5 strength, Normal gait.  Skin: Warm, dry without rashes, lesions, ecchymosis.  Neuro: Cranial nerves intact. Normal muscle tone, no cerebellar symptoms. Psych: Awake and oriented X 3, normal affect, Insight and Judgment appropriate.   Future Appointments Date Time Provider [place]  02/01/2016 2:30 PM Vicie Mutters, PA-C [facility] None  05/10/2016 3:45 PM Unk Pinto, MD [facility] None    Vicie Mutters, PA-C 1:18 PM [facility]

## 2016-02-02 LAB — HEMOGLOBIN A1C
HEMOGLOBIN A1C: 5.4 % (ref <5.7)
MEAN PLASMA GLUCOSE: 108 mg/dL

## 2016-02-07 ENCOUNTER — Telehealth: Payer: Self-pay

## 2016-02-07 NOTE — Telephone Encounter (Signed)
PT AWARE OF LAB RESULTS 

## 2016-02-13 ENCOUNTER — Other Ambulatory Visit: Payer: Self-pay | Admitting: Internal Medicine

## 2016-04-23 ENCOUNTER — Other Ambulatory Visit: Payer: Self-pay | Admitting: Internal Medicine

## 2016-04-23 DIAGNOSIS — Z1231 Encounter for screening mammogram for malignant neoplasm of breast: Secondary | ICD-10-CM

## 2016-05-10 ENCOUNTER — Ambulatory Visit
Admission: RE | Admit: 2016-05-10 | Discharge: 2016-05-10 | Disposition: A | Discharge status: Home / Self Care | Payer: Medicare Other | Source: Ambulatory Visit | Attending: Internal Medicine | Admitting: Internal Medicine

## 2016-05-10 ENCOUNTER — Ambulatory Visit (INDEPENDENT_AMBULATORY_CARE_PROVIDER_SITE_OTHER): Payer: Medicare Other | Admitting: Internal Medicine

## 2016-05-10 VITALS — BP 152/84 | HR 64 | Temp 97.4°F | Resp 16 | Ht 64.5 in | Wt 158.2 lb

## 2016-05-10 DIAGNOSIS — K219 Gastro-esophageal reflux disease without esophagitis: Secondary | ICD-10-CM | POA: Diagnosis not present

## 2016-05-10 DIAGNOSIS — R7309 Other abnormal glucose: Secondary | ICD-10-CM | POA: Diagnosis not present

## 2016-05-10 DIAGNOSIS — Z0001 Encounter for general adult medical examination with abnormal findings: Secondary | ICD-10-CM

## 2016-05-10 DIAGNOSIS — Z1212 Encounter for screening for malignant neoplasm of rectum: Secondary | ICD-10-CM

## 2016-05-10 DIAGNOSIS — Z136 Encounter for screening for cardiovascular disorders: Secondary | ICD-10-CM | POA: Diagnosis not present

## 2016-05-10 DIAGNOSIS — E782 Mixed hyperlipidemia: Secondary | ICD-10-CM | POA: Diagnosis not present

## 2016-05-10 DIAGNOSIS — Z79899 Other long term (current) drug therapy: Secondary | ICD-10-CM | POA: Diagnosis not present

## 2016-05-10 DIAGNOSIS — N183 Chronic kidney disease, stage 3 unspecified: Secondary | ICD-10-CM

## 2016-05-10 DIAGNOSIS — E559 Vitamin D deficiency, unspecified: Secondary | ICD-10-CM | POA: Diagnosis not present

## 2016-05-10 DIAGNOSIS — Z1231 Encounter for screening mammogram for malignant neoplasm of breast: Secondary | ICD-10-CM | POA: Diagnosis not present

## 2016-05-10 DIAGNOSIS — R6889 Other general symptoms and signs: Secondary | ICD-10-CM

## 2016-05-10 DIAGNOSIS — I10 Essential (primary) hypertension: Secondary | ICD-10-CM | POA: Diagnosis not present

## 2016-05-10 DIAGNOSIS — F3130 Bipolar disorder, current episode depressed, mild or moderate severity, unspecified: Secondary | ICD-10-CM

## 2016-05-10 LAB — CBC WITH DIFFERENTIAL/PLATELET
BASOS PCT: 0 %
Basophils Absolute: 0 cells/uL (ref 0–200)
EOS PCT: 2 %
Eosinophils Absolute: 126 cells/uL (ref 15–500)
HCT: 41.9 % (ref 35.0–45.0)
Hemoglobin: 13.7 g/dL (ref 11.7–15.5)
LYMPHS PCT: 20 %
Lymphs Abs: 1260 cells/uL (ref 850–3900)
MCH: 28.7 pg (ref 27.0–33.0)
MCHC: 32.7 g/dL (ref 32.0–36.0)
MCV: 87.8 fL (ref 80.0–100.0)
MONO ABS: 378 {cells}/uL (ref 200–950)
MONOS PCT: 6 %
MPV: 11 fL (ref 7.5–12.5)
Neutro Abs: 4536 cells/uL (ref 1500–7800)
Neutrophils Relative %: 72 %
PLATELETS: 199 10*3/uL (ref 140–400)
RBC: 4.77 MIL/uL (ref 3.80–5.10)
RDW: 13.3 % (ref 11.0–15.0)
WBC: 6.3 10*3/uL (ref 3.8–10.8)

## 2016-05-10 LAB — URINALYSIS, ROUTINE W REFLEX MICROSCOPIC
BILIRUBIN URINE: NEGATIVE
GLUCOSE, UA: NEGATIVE
Hgb urine dipstick: NEGATIVE
Ketones, ur: NEGATIVE
LEUKOCYTES UA: NEGATIVE
Nitrite: NEGATIVE
PH: 8 (ref 5.0–8.0)
Protein, ur: NEGATIVE
Specific Gravity, Urine: 1.02 (ref 1.001–1.035)

## 2016-05-10 NOTE — Progress Notes (Signed)
[facility] Unk Pinto, M.D.    Uvaldo Bristle. Silverio Lay, P.A.-C      Starlyn Skeans, P.A.-C  [facility]                [address] [place], [place] [id number] Telephone [phone] Telefax [phone]  Annual Screening/Preventative Visit & Comprehensive Evaluation &  Examination     This very nice 70 y.o. [name] presents for a Screening/Preventative Visit & comprehensive evaluation and management of multiple medical co-morbidities.  Patient has been followed for HTN, Prediabetes, Hyperlipidemia and Vitamin D Deficiency.Patient also has long hx/o bipolar Depression followed by Dr Toy Care and recently she has had an exacerbation of her Depression dealing with the loss of her 57 yo daughter with a drug OD.       HTN predates since 1995. Patient's BP has been controlled at home and patient denies any cardiac symptoms as chest pain, palpitations, shortness of breath, dizziness or ankle swelling. Today's BP is sl elevated at 152/84.       Patient's hyperlipidemia is controlled with diet and medications. Patient denies myalgias or other medication SE's. Last lipids were at goal: Lab Results  Component Value Date   CHOL 169 02/01/2016   HDL 62 02/01/2016   LDLCALC 92 02/01/2016   TRIG 74 02/01/2016   CHOLHDL 2.7 02/01/2016      Patient has prediabetes in 2012 with A1c 6.0% and patient denies reactive hypoglycemic symptoms, visual blurring, diabetic polys, or paresthesias. Last A1c was at goal: Lab Results  Component Value Date   HGBA1C 5.4 02/01/2016      Finally, patient has history of Vitamin D Deficiency in 2010 of "13"  and last Vitamin D was at goal: Lab Results  Component Value Date   VD25OH 76 07/27/2015   Current Outpatient Prescriptions on File Prior to Visit  Medication Sig  . ALPRAZolam  1 MG tablet Take 1 mg by mouth 3 (three) times daily as needed. For anxiety.  Marland Kitchen aspirin EC 81 MG tablet Take  81 mg by mouth daily.  . bisoprolol-hctz 5-6.25 MG tablet take 1 tablet by mouth once daily for blood pressure and FLUID  . buPROPion-XL 150 MG 24 hr tablet Take 450 mg by mouth daily.  Marland Kitchen VITAMIN D3 5000 UNITS CAPS Take 5,000 Units by mouth daily.   Marland Kitchen esomeprazole  40 MG capsule take 1 capsule by mouth once daily AT NOON  . Hydrochlorothiazide 12.5 MG cap take 1 capsule by mouth once daily for BLOOD PRESSURE / FLUID.  Marland Kitchen lamoTRIgine  200 MG tablet   . rosuvastatin  40 MG tablet take 1/2 to 1 tablet by mouth once daily  . sertraline  100 MG tablet    Allergies  Allergen Reactions  . Lipitor [Atorvastatin]     Fatigue  . Prednisone Other (See Comments)    Change in mental status   Past Medical History:  Diagnosis Date  . Alcoholism ([place])   . Anxiety   . Depression   . Elevated hemoglobin A1c   . Fracture of right wrist   . GERD (gastroesophageal reflux disease)   . Heart murmur   . Hyperlipidemia   . Hypertension   . IBS (irritable bowel syndrome)    Health Maintenance  Topic Date Due  . Hepatitis C Screening  07/26/2016 (Originally [birth year])  . MAMMOGRAM  05/10/2017  . COLONOSCOPY  03/27/2022  . TETANUS/TDAP  07/26/2025  . INFLUENZA VACCINE  Completed  . DEXA SCAN  Completed  . ZOSTAVAX  Completed  . PNA vac Low Risk Adult  Completed   Immunization History  Administered Date(s) Administered  . DT 07/27/2015  . Influenza, High Dose Seasonal PF 01/28/2014  . Influenza-Unspecified 03/15/2011, 01/04/2015, 02/17/2016  . Pneumococcal Conjugate-13 06/09/2014  . Pneumococcal-Unspecified 05/14/2008  . Td 05/14/2005  . Zoster 01/23/2012   Past Surgical History:  Procedure Laterality Date  . ABDOMINAL HYSTERECTOMY  1991  . APPENDECTOMY    . BREAST SURGERY Left 1989   [facility]  . CARPAL TUNNEL RELEASE Right 10/18/2014   Procedure: CARPAL TUNNEL RELEASE;  Surgeon: Dorna Leitz, MD;  Location: [facility];  Service: Orthopedics;  Laterality: Right;  . [place]  .  NECK SURGERY  Remote    Ruptured disc, per patient. Got infected.   . ORIF WRIST FRACTURE Right 10/18/2014   Procedure: OPEN REDUCTION INTERNAL FIXATION (ORIF) RIGHT WRIST FRACTURE;  Surgeon: Dorna Leitz, MD;  Location: [facility];  Service: Orthopedics;  Laterality: Right;  . TONSILLECTOMY  1965  . TUBAL LIGATION     Family History  Problem Relation Age of Onset  . Heart disease Mother   . Diabetes Mother   . Leukemia Father   . Heart disease Brother   . Cancer Brother   . Parkinson's disease Brother    Social History  Substance Use Topics  . Smoking status: Never Smoker  . Smokeless tobacco: Never Used  . Alcohol use No    ROS Constitutional: Denies fever, chills, weight loss/gain, headaches, insomnia,  night sweats, and change in appetite. Does c/o fatigue. Eyes: Denies redness, blurred vision, diplopia, discharge, itchy, watery eyes.  ENT: Denies discharge, congestion, post nasal drip, epistaxis, sore throat, earache, hearing loss, dental pain, Tinnitus, Vertigo, Sinus pain, snoring.  Cardio: Denies chest pain, palpitations, irregular heartbeat, syncope, dyspnea, diaphoresis, orthopnea, PND, claudication, edema Respiratory: denies cough, dyspnea, DOE, pleurisy, hoarseness, laryngitis, wheezing.  Gastrointestinal: Denies dysphagia, heartburn, reflux, water brash, pain, cramps, nausea, vomiting, bloating, diarrhea, constipation, hematemesis, melena, hematochezia, jaundice, hemorrhoids Genitourinary: Denies dysuria, frequency, urgency, nocturia, hesitancy, discharge, hematuria, flank pain Breast: Breast lumps, nipple discharge, bleeding.  Musculoskeletal: Denies arthralgia, myalgia, stiffness, Jt. Swelling, pain, limp, and strain/sprain. Denies falls. Skin: Denies puritis, rash, hives, warts, acne, eczema, changing in skin lesion Neuro: No weakness, tremor, incoordination, spasms, paresthesia, pain Psychiatric: Denies confusion, memory loss, sensory loss. Denies Depression. Endocrine:  Denies change in weight, skin, hair change, nocturia, and paresthesia, diabetic polys, visual blurring, hyper / hypo glycemic episodes.  Heme/Lymph: No excessive bleeding, bruising, enlarged lymph nodes.  Physical Exam  BP (!) 152/84   Pulse 64   Temp 97.4 F (36.3 C)   Resp 16   Ht 5' 4.5" (1.638 m)   Wt 158 lb 3.2 oz (71.8 kg)   BMI 26.74 kg/m   General Appearance: Well nourished and in no apparent distress.  Eyes: PERRLA, EOMs, conjunctiva no swelling or erythema, normal fundi and vessels. Sinuses: No frontal/maxillary tenderness ENT/Mouth: EACs patent / TMs  nl. Nares clear without erythema, swelling, mucoid exudates. Oral hygiene is good. No erythema, swelling, or exudate. Tongue normal, non-obstructing. Tonsils not swollen or erythematous. Hearing normal.  Neck: Supple, thyroid normal. No bruits, nodes or JVD. Respiratory: Respiratory effort normal.  BS equal and clear bilateral without rales, rhonci, wheezing or stridor. Cardio: Heart sounds are normal with regular rate and rhythm and no murmurs, rubs or gallops.  Peripheral pulses are normal and equal bilaterally without edema. No aortic or femoral bruits. Chest: symmetric with normal excursions and percussion. Breasts: Symmetric, without lumps, nipple discharge, retractions, or fibrocystic changes.  Abdomen: Flat, soft with bowel sounds active. Nontender, no guarding, rebound, hernias, masses, or organomegaly.  Lymphatics: Non tender without lymphadenopathy.  Genitourinary:  Musculoskeletal: Full ROM all peripheral extremities, joint stability, 5/5 strength, and normal gait. Skin: Warm and dry without rashes, lesions, cyanosis, clubbing or  ecchymosis.  Neuro: Cranial nerves intact, reflexes equal bilaterally. Normal muscle tone, no cerebellar symptoms. Sensation intact.  Pysch: Alert and oriented X 3, normal affect, Insight and Judgment appropriate.   Assessment and Plan  1. Annual Preventative Screening  Examination       Continue prudent diet as discussed, weight control, BP monitoring, regular exercise, and medications. Discussed med's effects and SE's. Screening labs and tests as requested with regular follow-up as recommended. Over 40 minutes of exam, counseling, chart review and high complex critical decision making was performed.

## 2016-05-10 NOTE — Patient Instructions (Signed)

## 2016-05-11 ENCOUNTER — Encounter: Payer: Self-pay | Admitting: Internal Medicine

## 2016-05-11 LAB — HEPATIC FUNCTION PANEL
ALBUMIN: 4.6 g/dL (ref 3.6–5.1)
ALK PHOS: 49 U/L (ref 33–130)
ALT: 14 U/L (ref 6–29)
AST: 20 U/L (ref 10–35)
BILIRUBIN INDIRECT: 0.4 mg/dL (ref 0.2–1.2)
BILIRUBIN TOTAL: 0.5 mg/dL (ref 0.2–1.2)
Bilirubin, Direct: 0.1 mg/dL (ref <=0.2)
Total Protein: 6.8 g/dL (ref 6.1–8.1)

## 2016-05-11 LAB — LIPID PANEL
CHOLESTEROL: 185 mg/dL (ref <200)
HDL: 59 mg/dL (ref >50)
LDL CALC: 99 mg/dL (ref <100)
TRIGLYCERIDES: 134 mg/dL (ref <150)
Total CHOL/HDL Ratio: 3.1 Ratio (ref <5.0)
VLDL: 27 mg/dL (ref <30)

## 2016-05-11 LAB — MICROALBUMIN / CREATININE URINE RATIO
Creatinine, Urine: 128 mg/dL (ref 20–320)
MICROALB UR: 0.6 mg/dL
Microalb Creat Ratio: 5 mcg/mg creat (ref <30)

## 2016-05-11 LAB — BASIC METABOLIC PANEL WITH GFR
BUN: 15 mg/dL (ref 7–25)
CALCIUM: 9.6 mg/dL (ref 8.6–10.4)
CO2: 27 mmol/L (ref 20–31)
Chloride: 101 mmol/L (ref 98–110)
Creat: 0.96 mg/dL — ABNORMAL HIGH (ref 0.60–0.93)
GFR, EST AFRICAN AMERICAN: 68 mL/min (ref >=60)
GFR, EST NON AFRICAN AMERICAN: 59 mL/min — AB (ref >=60)
Glucose, Bld: 93 mg/dL (ref 65–99)
POTASSIUM: 4 mmol/L (ref 3.5–5.3)
Sodium: 142 mmol/L (ref 135–146)

## 2016-05-11 LAB — INSULIN, RANDOM: INSULIN: 5.8 u[IU]/mL (ref 2.0–19.6)

## 2016-05-11 LAB — HEMOGLOBIN A1C
Hgb A1c MFr Bld: 5.5 % (ref <5.7)
MEAN PLASMA GLUCOSE: 111 mg/dL

## 2016-05-11 LAB — TSH: TSH: 1.76 mIU/L

## 2016-05-11 LAB — MAGNESIUM: Magnesium: 2.1 mg/dL (ref 1.5–2.5)

## 2016-05-11 LAB — VITAMIN D 25 HYDROXY (VIT D DEFICIENCY, FRACTURES): Vit D, 25-Hydroxy: 80 ng/mL (ref 30–100)

## 2016-05-12 LAB — LAMOTRIGINE LEVEL: LAMOTRIGINE LVL: 9.5 ug/mL (ref 4.0–18.0)

## 2016-05-14 HISTORY — PX: BREAST BIOPSY: SHX20

## 2016-05-15 ENCOUNTER — Other Ambulatory Visit: Payer: Self-pay | Admitting: Internal Medicine

## 2016-05-15 DIAGNOSIS — R928 Other abnormal and inconclusive findings on diagnostic imaging of breast: Secondary | ICD-10-CM

## 2016-05-18 ENCOUNTER — Other Ambulatory Visit: Payer: Self-pay | Admitting: Internal Medicine

## 2016-05-18 ENCOUNTER — Ambulatory Visit
Admission: RE | Admit: 2016-05-18 | Discharge: 2016-05-18 | Disposition: A | Discharge status: Home / Self Care | Payer: Medicare Other | Source: Ambulatory Visit | Attending: Internal Medicine | Admitting: Internal Medicine

## 2016-05-18 DIAGNOSIS — N6310 Unspecified lump in the right breast, unspecified quadrant: Secondary | ICD-10-CM

## 2016-05-18 DIAGNOSIS — N6322 Unspecified lump in the left breast, upper inner quadrant: Secondary | ICD-10-CM | POA: Diagnosis not present

## 2016-05-18 DIAGNOSIS — R928 Other abnormal and inconclusive findings on diagnostic imaging of breast: Secondary | ICD-10-CM

## 2016-05-18 DIAGNOSIS — N6312 Unspecified lump in the right breast, upper inner quadrant: Secondary | ICD-10-CM | POA: Diagnosis not present

## 2016-05-22 ENCOUNTER — Other Ambulatory Visit: Payer: Self-pay | Admitting: Internal Medicine

## 2016-05-22 DIAGNOSIS — N6310 Unspecified lump in the right breast, unspecified quadrant: Secondary | ICD-10-CM

## 2016-05-23 ENCOUNTER — Other Ambulatory Visit: Payer: Self-pay | Admitting: Internal Medicine

## 2016-05-23 ENCOUNTER — Ambulatory Visit
Admission: RE | Admit: 2016-05-23 | Discharge: 2016-05-23 | Disposition: A | Discharge status: Home / Self Care | Payer: Medicare Other | Source: Ambulatory Visit | Attending: Internal Medicine | Admitting: Internal Medicine

## 2016-05-23 DIAGNOSIS — N6310 Unspecified lump in the right breast, unspecified quadrant: Secondary | ICD-10-CM | POA: Diagnosis not present

## 2016-05-23 DIAGNOSIS — N641 Fat necrosis of breast: Secondary | ICD-10-CM | POA: Diagnosis not present

## 2016-05-23 DIAGNOSIS — N6312 Unspecified lump in the right breast, upper inner quadrant: Secondary | ICD-10-CM | POA: Diagnosis not present

## 2016-05-23 DIAGNOSIS — R928 Other abnormal and inconclusive findings on diagnostic imaging of breast: Secondary | ICD-10-CM | POA: Diagnosis not present

## 2016-05-25 ENCOUNTER — Ambulatory Visit: Payer: Medicare Other

## 2016-05-28 ENCOUNTER — Other Ambulatory Visit: Payer: Self-pay | Admitting: Physician Assistant

## 2016-06-13 ENCOUNTER — Other Ambulatory Visit: Payer: Self-pay | Admitting: Internal Medicine

## 2016-08-06 ENCOUNTER — Other Ambulatory Visit: Payer: Self-pay | Admitting: Internal Medicine

## 2016-08-08 ENCOUNTER — Ambulatory Visit (INDEPENDENT_AMBULATORY_CARE_PROVIDER_SITE_OTHER): Payer: Medicare Other | Admitting: Internal Medicine

## 2016-08-08 ENCOUNTER — Encounter: Payer: Self-pay | Admitting: Internal Medicine

## 2016-08-08 VITALS — BP 108/70 | HR 62 | Temp 98.2°F | Resp 18 | Ht 64.5 in | Wt 158.0 lb

## 2016-08-08 DIAGNOSIS — F3341 Major depressive disorder, recurrent, in partial remission: Secondary | ICD-10-CM | POA: Diagnosis not present

## 2016-08-08 DIAGNOSIS — N3281 Overactive bladder: Secondary | ICD-10-CM | POA: Diagnosis not present

## 2016-08-08 DIAGNOSIS — Z79899 Other long term (current) drug therapy: Secondary | ICD-10-CM | POA: Diagnosis not present

## 2016-08-08 DIAGNOSIS — I10 Essential (primary) hypertension: Secondary | ICD-10-CM | POA: Diagnosis not present

## 2016-08-08 DIAGNOSIS — E559 Vitamin D deficiency, unspecified: Secondary | ICD-10-CM | POA: Diagnosis not present

## 2016-08-08 DIAGNOSIS — E782 Mixed hyperlipidemia: Secondary | ICD-10-CM | POA: Diagnosis not present

## 2016-08-08 DIAGNOSIS — R7309 Other abnormal glucose: Secondary | ICD-10-CM

## 2016-08-08 DIAGNOSIS — F1020 Alcohol dependence, uncomplicated: Secondary | ICD-10-CM | POA: Diagnosis not present

## 2016-08-08 LAB — CBC WITH DIFFERENTIAL/PLATELET
BASOS PCT: 1 %
Basophils Absolute: 57 cells/uL (ref 0–200)
EOS ABS: 114 {cells}/uL (ref 15–500)
Eosinophils Relative: 2 %
HEMATOCRIT: 42.2 % (ref 35.0–45.0)
HEMOGLOBIN: 14 g/dL (ref 11.7–15.5)
LYMPHS ABS: 1083 {cells}/uL (ref 850–3900)
Lymphocytes Relative: 19 %
MCH: 29.9 pg (ref 27.0–33.0)
MCHC: 33.2 g/dL (ref 32.0–36.0)
MCV: 90 fL (ref 80.0–100.0)
MONO ABS: 399 {cells}/uL (ref 200–950)
MPV: 11.3 fL (ref 7.5–12.5)
Monocytes Relative: 7 %
NEUTROS ABS: 4047 {cells}/uL (ref 1500–7800)
Neutrophils Relative %: 71 %
PLATELETS: 202 10*3/uL (ref 140–400)
RBC: 4.69 MIL/uL (ref 3.80–5.10)
RDW: 12.7 % (ref 11.0–15.0)
WBC: 5.7 10*3/uL (ref 3.8–10.8)

## 2016-08-08 NOTE — Patient Instructions (Addendum)
Pelvic Organ Prolapse Pelvic organ prolapse is the stretching, bulging, or dropping of pelvic organs into an abnormal position. It happens when the muscles and tissues that surround and support pelvic structures are stretched or weak. Pelvic organ prolapse can involve:  Vagina (vaginal prolapse).  Uterus (uterine prolapse).  Bladder (cystocele).  Rectum (rectocele).  Intestines (enterocele). When organs other than the vagina are involved, they often bulge into the vagina or protrude from the vagina, depending on how severe the prolapse is. What are the causes? Causes of this condition include:  Pregnancy, labor, and childbirth.  Long-lasting (chronic) cough.  Chronic constipation.  Obesity.  Past pelvic surgery.  Aging. During and after menopause, a decreased production of the hormone estrogen can weaken pelvic ligaments and muscles.  Consistently lifting more than 50 lb (23 kg).  Buildup of fluid in the abdomen due to certain diseases and other conditions. What are the signs or symptoms? Symptoms of this condition include:  Loss of bladder control when you cough, sneeze, strain, and exercise (stress incontinence). This may be worse immediately following childbirth, and it may gradually improve over time.  Feeling pressure in your pelvis or vagina. This pressure may increase when you cough or when you are having a bowel movement.  A bulge that protrudes from the opening of your vagina or against your vaginal wall. If your uterus protrudes through the opening of your vagina and rubs against your clothing, you may also experience soreness, ulcers, infection, pain, and bleeding.  Increased effort to have a bowel movement or urinate.  Pain in your low back.  Pain, discomfort, or disinterest in sexual intercourse.  Repeated bladder infections (urinary tract infections).  Difficulty inserting or inability to insert a tampon or applicator. In some people, this condition does  not cause any symptoms. How is this diagnosed? Your health care provider may perform an internal and external vaginal and rectal exam. During the exam, you may be asked to cough and strain while you are lying down, sitting, and standing up. Your health care provider will determine if other tests are required, such as bladder function tests. How is this treated? In most cases, this condition needs to be treated only if it produces symptoms. No treatment is guaranteed to correct the prolapse or relieve the symptoms completely. Treatment may include:  Lifestyle changes, such as:  Avoiding drinking beverages that contain caffeine.  Increasing your intake of high-fiber foods. This can help to decrease constipation and straining during bowel movements.  Emptying your bladder at scheduled times (bladder training therapy). This can help to reduce or avoid urinary incontinence.  Losing weight if you are overweight or obese.  Estrogen. Estrogen may help mild prolapse by increasing the strength and tone of pelvic floor muscles.  Kegel exercises. These may help mild cases of prolapse by strengthening and tightening the muscles of the pelvic floor.  Pessary insertion. A pessary is a soft, flexible device that is placed into your vagina by your health care provider to help support the vaginal walls and keep pelvic organs in place.  Surgery. This is often the only form of treatment for severe prolapse. Different types of surgeries are available. Follow these instructions at home:  Wear a sanitary pad or absorbent product if you have urinary incontinence.  Avoid heavy lifting and straining with exercise and work. Do not hold your breath when you perform mild to moderate lifting and exercise activities. Limit your activities as directed by your health care provider.  Take medicines   only as directed by your health care provider.  Perform Kegel exercises as directed by your health care provider.  If  you have a pessary, take care of it as directed by your health care provider. Contact a health care provider if:  Your symptoms interfere with your daily activities or sex life.  You need medicine to help with the discomfort.  You notice bleeding from the vagina that is not related to your period.  You have a fever.  You have pain or bleeding when you urinate.  You have bleeding when you have a bowel movement.  You lose urine when you have sex.  You have chronic constipation.  You have a pessary that falls out.  You have vaginal discharge that has a bad smell.  You have low abdominal pain or cramping that is unusual for you. This information is not intended to replace advice given to you by your health care provider. Make sure you discuss any questions you have with your health care provider. Document Released: 11/25/2013 Document Revised: 10/06/2015 Document Reviewed: 07/13/2013 Elsevier Interactive Patient Education  2017 [place] Address: [address] [place], [place] [address]  Phone: [phone]

## 2016-08-08 NOTE — Progress Notes (Signed)
Assessment and Plan:  Hypertension:  -well controlled currently -Continue medication,  -monitor blood pressure at home.  -Continue DASH diet.   -Reminder to go to the ER if any CP, SOB, nausea, dizziness, severe HA, changes vision/speech, left arm numbness and tingling, and jaw pain.  Cholesterol: -cont crestor -at goal -Continue diet and exercise.  -Check cholesterol.   Pre-diabetes: -most recent A1c levels have been controlled -Continue diet and exercise.  -Check A1C  Vitamin D Def: -continue medications.   Grief and Depression -followed by psych -recommended that she attend the hospice grief group.  Have given her contact information -recommend she talk further with Dr. Toy Care for further adjustment of her medications  Dysuria -UA -Urine culture -likely has a component of a cystocele causing obstruction outflow if no relief with medications -does not want to do anything about it currently.  History of alcohol abuse -no drinking currently per her report -concern with alcohol history that she is on benzodiazepine  Continue diet and meds as discussed. Further disposition pending results of labs.  HPI 71 y.o. female  presents for 3 month follow up with hypertension, hyperlipidemia, prediabetes and vitamin D.   Her blood pressure has been controlled at home, today their BP is BP: 108/70.   She does not workout. She denies chest pain, shortness of breath, dizziness.   She is on cholesterol medication and denies myalgias. Her cholesterol is at goal. The cholesterol last visit was:   Lab Results  Component Value Date   CHOL 185 05/10/2016   HDL 59 05/10/2016   LDLCALC 99 05/10/2016   TRIG 134 05/10/2016   CHOLHDL 3.1 05/10/2016     She has not been working on diet and exercise for prediabetes, and denies foot ulcerations, hyperglycemia, hypoglycemia , increased appetite, nausea, paresthesia of the feet, polydipsia, polyuria, visual disturbances, vomiting and weight  loss. Last A1C in the office was:  Lab Results  Component Value Date   HGBA1C 5.5 05/10/2016    Patient is on Vitamin D supplement.  Lab Results  Component Value Date   VD25OH 23 05/10/2016      She reports that she has been having some trouble with urinating very frequently.  She reports that she sometimes has some trouble with emptying her bladder.  She reports that she does not have dysuria.  She does not smell an odor.  She reports that she is not having vaginal discharge or rash.  She reports that she also hasn't noticed blood either.  She does have a history of OAB and has tried myrbetriq in the past without relief.   She reports that she has been having some trouble with her nerves.  She reports that she just has some days where she cries a lot.  She reports that she overdosed on drugs.  She reports that she knew that this would happen.  She reports that she is still not tolerating it well.        Current Medications:  Current Outpatient Prescriptions on File Prior to Visit  Medication Sig Dispense Refill  . ALPRAZolam (XANAX) 1 MG tablet Take 1 mg by mouth 3 (three) times daily as needed. For anxiety.    Marland Kitchen aspirin EC 81 MG tablet Take 81 mg by mouth daily.    . bisoprolol-hydrochlorothiazide (ZIAC) 5-6.25 MG tablet take 1 tablet by mouth once daily for blood pressure and fluid retention 90 tablet 1  . buPROPion (WELLBUTRIN XL) 150 MG 24 hr tablet Take 450 mg by mouth  daily.    . Cholecalciferol (VITAMIN D3) 5000 UNITS CAPS Take 5,000 Units by mouth daily.     Marland Kitchen esomeprazole (NEXIUM) 40 MG capsule take 1 capsule by mouth once daily AT NOON 30 capsule 3  . gabapentin (NEURONTIN) 300 MG capsule take 1 to 2 capsules by mouth at bedtime 60 capsule 2  . hydrochlorothiazide (MICROZIDE) 12.5 MG capsule take 1 capsule by mouth once daily FOR BLOOD PRESSURE/FLUID 90 capsule 0  . lamoTRIgine (LAMICTAL) 200 MG tablet   0  . rosuvastatin (CRESTOR) 40 MG tablet take 1/2 to 1 tablet by mouth  once daily 90 tablet 1  . sertraline (ZOLOFT) 100 MG tablet   0   No current facility-administered medications on file prior to visit.     Medical History:  Past Medical History:  Diagnosis Date  . Alcoholism ([place])   . Anxiety   . Depression   . Elevated hemoglobin A1c   . Fracture of right wrist   . GERD (gastroesophageal reflux disease)   . Heart murmur   . Hyperlipidemia   . Hypertension   . IBS (irritable bowel syndrome)     Allergies:  Allergies  Allergen Reactions  . Lipitor [Atorvastatin]     Fatigue  . Prednisone Other (See Comments)    Change in mental status     Review of Systems:  Review of Systems  Constitutional: Negative for chills, fever and malaise/fatigue.  HENT: Negative for congestion, ear pain and sore throat.   Eyes: Negative.   Respiratory: Negative for cough, shortness of breath and wheezing.   Cardiovascular: Negative for chest pain, palpitations and leg swelling.  Gastrointestinal: Negative for abdominal pain, blood in stool, constipation, diarrhea, heartburn and melena.  Genitourinary: Negative.   Skin: Negative.   Neurological: Negative for dizziness, sensory change, loss of consciousness and headaches.  Psychiatric/Behavioral: Negative for depression. The patient is not nervous/anxious and does not have insomnia.     Family history- Review and unchanged  Social history- Review and unchanged  Physical Exam: BP 108/70   Pulse 62   Temp 98.2 F (36.8 C) (Temporal)   Resp 18   Ht 5' 4.5" (1.638 m)   Wt 158 lb (71.7 kg)   BMI 26.70 kg/m  Wt Readings from Last 3 Encounters:  08/08/16 158 lb (71.7 kg)  05/10/16 158 lb 3.2 oz (71.8 kg)  02/01/16 148 lb 12.8 oz (67.5 kg)    General Appearance: Well nourished well developed, in no apparent distress. Eyes: PERRLA, EOMs, conjunctiva no swelling or erythema ENT/Mouth: Ear canals normal without obstruction, swelling, erythma, discharge.  TMs normal bilaterally.  Oropharynx moist, clear,  without exudate, or postoropharyngeal swelling. Neck: Supple, thyroid normal,no cervical adenopathy  Respiratory: Respiratory effort normal, Breath sounds clear A&P without rhonchi, wheeze, or rale.  No retractions, no accessory usage. Cardio: RRR with no MRGs. Brisk peripheral pulses without edema.  Abdomen: Soft, + BS,  Non tender, no guarding, rebound, hernias, masses. Musculoskeletal: Full ROM, 5/5 strength, Normal gait Skin: Warm, dry without rashes, lesions, ecchymosis.  Neuro: Awake and oriented X 3, Cranial nerves intact. Normal muscle tone, no cerebellar symptoms. Psych: Weepy with depressed affect,  Mildly slurred speech which is patients baseline, Insight and Judgment poor.    Starlyn Skeans, PA-C 3:53 PM [facility]

## 2016-08-09 LAB — HEPATIC FUNCTION PANEL
ALBUMIN: 4.5 g/dL (ref 3.6–5.1)
ALK PHOS: 55 U/L (ref 33–130)
ALT: 13 U/L (ref 6–29)
AST: 18 U/L (ref 10–35)
BILIRUBIN TOTAL: 0.5 mg/dL (ref 0.2–1.2)
Bilirubin, Direct: 0.1 mg/dL (ref <=0.2)
Indirect Bilirubin: 0.4 mg/dL (ref 0.2–1.2)
Total Protein: 7.2 g/dL (ref 6.1–8.1)

## 2016-08-09 LAB — LIPID PANEL
CHOL/HDL RATIO: 3 ratio (ref <5.0)
CHOLESTEROL: 183 mg/dL (ref <200)
HDL: 62 mg/dL (ref >50)
LDL Cholesterol: 97 mg/dL (ref <100)
Triglycerides: 118 mg/dL (ref <150)
VLDL: 24 mg/dL (ref <30)

## 2016-08-09 LAB — BASIC METABOLIC PANEL WITH GFR
BUN: 17 mg/dL (ref 7–25)
CHLORIDE: 104 mmol/L (ref 98–110)
CO2: 24 mmol/L (ref 20–31)
CREATININE: 1.05 mg/dL — AB (ref 0.60–0.93)
Calcium: 9.7 mg/dL (ref 8.6–10.4)
GFR, Est African American: 61 mL/min (ref >=60)
GFR, Est Non African American: 53 mL/min — ABNORMAL LOW (ref >=60)
Glucose, Bld: 95 mg/dL (ref 65–99)
POTASSIUM: 4.2 mmol/L (ref 3.5–5.3)
Sodium: 144 mmol/L (ref 135–146)

## 2016-08-09 LAB — HEMOGLOBIN A1C
HEMOGLOBIN A1C: 5.2 % (ref <5.7)
MEAN PLASMA GLUCOSE: 103 mg/dL

## 2016-08-09 LAB — TSH: TSH: 2.33 m[IU]/L

## 2016-08-18 ENCOUNTER — Other Ambulatory Visit: Payer: Self-pay | Admitting: Internal Medicine

## 2016-09-11 ENCOUNTER — Other Ambulatory Visit: Payer: Self-pay | Admitting: Physician Assistant

## 2016-11-12 ENCOUNTER — Ambulatory Visit: Payer: Self-pay | Admitting: Internal Medicine

## 2016-11-12 NOTE — Progress Notes (Signed)
This very nice 71 y.o. [name] presents for 6 month follow up with Hypertension, Hyperlipidemia, Pre-Diabetes and Vitamin D Deficiency. Patient also has GERD controlled with her Meds.      Patient has long hx/o Bipolar depression (Dr Chucky May)  and note her daughter died a year ago with a drug OD and in the last week her Grandson died of a drug OD.      Patient is treated for HTN (1995) & BP has been controlled at home. Today's  . Patient has had no complaints of any cardiac type chest pain, palpitations, dyspnea/orthopnea/PND, dizziness, claudication, or dependent edema.     Hyperlipidemia is controlled with diet & meds. Patient denies myalgias or other med SE's. Last Lipids were at goal: Lab Results  Component Value Date   CHOL 183 08/08/2016   HDL 62 08/08/2016   LDLCALC 97 08/08/2016   TRIG 118 08/08/2016   CHOLHDL 3.0 08/08/2016      Also, the patient has history of PreDiabetes (A1c 6.0% in 2012) and has had no symptoms of reactive hypoglycemia, diabetic polys, paresthesias or visual blurring.  Last A1c was at goal: Lab Results  Component Value Date   HGBA1C 5.2 08/08/2016      Further, the patient also has history of Vitamin D Deficiency ("13" in 2010) and supplements vitamin D without any suspected side-effects. Last vitamin D was at goal: Lab Results  Component Value Date   VD25OH 53 05/10/2016   Current Outpatient Prescriptions on File Prior to Visit  Medication Sig  . ALPRAZolam (XANAX) 1 MG tablet Take 1 mg by mouth 3 (three) times daily as needed. For anxiety.  Marland Kitchen aspirin EC 81 MG tablet Take 81 mg by mouth daily.  . bisoprolol-hydrochlorothiazide (ZIAC) 5-6.25 MG tablet take 1 tablet by mouth once daily for blood pressure and fluid retention  . buPROPion (WELLBUTRIN XL) 150 MG 24 hr tablet Take 450 mg by mouth daily.  . Cholecalciferol (VITAMIN D3) 5000 UNITS CAPS Take 5,000 Units by mouth daily.   Marland Kitchen esomeprazole (NEXIUM) 40 MG capsule take 1 capsule by mouth once  daily AT NOON  . gabapentin (NEURONTIN) 300 MG capsule take 1 to 2 capsules by mouth at bedtime  . hydrochlorothiazide (MICROZIDE) 12.5 MG capsule take 1 capsule by mouth once daily for blood pressure and FLUID  . lamoTRIgine (LAMICTAL) 200 MG tablet   . rosuvastatin (CRESTOR) 40 MG tablet take 1/2 to 1 tablet by mouth once daily  . sertraline (ZOLOFT) 100 MG tablet    No current facility-administered medications on file prior to visit.    Allergies  Allergen Reactions  . Lipitor [Atorvastatin]     Fatigue  . Prednisone Other (See Comments)    Change in mental status   PMHx:   Past Medical History:  Diagnosis Date  . Alcoholism ([place])   . Anxiety   . Depression   . Elevated hemoglobin A1c   . Fracture of right wrist   . GERD (gastroesophageal reflux disease)   . Heart murmur   . Hyperlipidemia   . Hypertension   . IBS (irritable bowel syndrome)    Immunization History  Administered Date(s) Administered  . DT 07/27/2015  . Influenza, High Dose Seasonal PF 01/28/2014  . Influenza-Unspecified 03/15/2011, 01/04/2015, 02/17/2016  . Pneumococcal Conjugate-13 06/09/2014, 07/12/2016  . Pneumococcal-Unspecified 05/14/2008  . Td 05/14/2005  . Zoster 01/23/2012   Past Surgical History:  Procedure Laterality Date  . ABDOMINAL HYSTERECTOMY  1991  .  APPENDECTOMY    . BREAST SURGERY Left 1989   [facility]  . CARPAL TUNNEL RELEASE Right 10/18/2014   Procedure: CARPAL TUNNEL RELEASE;  Surgeon: Dorna Leitz, MD;  Location: [place];  Service: Orthopedics;  Laterality: Right;  . [place]  . NECK SURGERY  Remote    Ruptured disc, per patient. Got infected.   . ORIF WRIST FRACTURE Right 10/18/2014   Procedure: OPEN REDUCTION INTERNAL FIXATION (ORIF) RIGHT WRIST FRACTURE;  Surgeon: Dorna Leitz, MD;  Location: [place];  Service: Orthopedics;  Laterality: Right;  . TONSILLECTOMY  1965  . TUBAL LIGATION     FHx:    Reviewed / unchanged  SHx:    Reviewed / unchanged  Systems  Review:  Constitutional: Denies fever, chills, wt changes, headaches, insomnia, fatigue, night sweats, change in appetite. Eyes: Denies redness, blurred vision, diplopia, discharge, itchy, watery eyes.  ENT: Denies discharge, congestion, post nasal drip, epistaxis, sore throat, earache, hearing loss, dental pain, tinnitus, vertigo, sinus pain, snoring.  CV: Denies chest pain, palpitations, irregular heartbeat, syncope, dyspnea, diaphoresis, orthopnea, PND, claudication or edema. Respiratory: denies cough, dyspnea, DOE, pleurisy, hoarseness, laryngitis, wheezing.  Gastrointestinal: Denies dysphagia, odynophagia, heartburn, reflux, water brash, abdominal pain or cramps, nausea, vomiting, bloating, diarrhea, constipation, hematemesis, melena, hematochezia  or hemorrhoids. Genitourinary: Denies dysuria, frequency, urgency, nocturia, hesitancy, discharge, hematuria or flank pain. Musculoskeletal: Denies arthralgias, myalgias, stiffness, jt. swelling, pain, limping or strain/sprain.  Skin: Denies pruritus, rash, hives, warts, acne, eczema or change in skin lesion(s). Neuro: No weakness, tremor, incoordination, spasms, paresthesia or pain. Psychiatric: Denies confusion, memory loss or sensory loss. Endo: Denies change in weight, skin or hair change.  Heme/Lymph: No excessive bleeding, bruising or enlarged lymph nodes.  Physical Exam  There were no vitals taken for this visit.  Appears well nourished, well groomed  and in no distress.  Eyes: PERRLA, EOMs, conjunctiva no swelling or erythema. Sinuses: No frontal/maxillary tenderness ENT/Mouth: EAC's clear, TM's nl w/o erythema, bulging. Nares clear w/o erythema, swelling, exudates. Oropharynx clear without erythema or exudates. Oral hygiene is good. Tongue normal, non obstructing. Hearing intact.  Neck: Supple. Thyroid nl. Car 2+/2+ without bruits, nodes or JVD. Chest: Respirations nl with BS clear & equal w/o rales, rhonchi, wheezing or stridor.    Cor: Heart sounds normal w/ regular rate and rhythm without sig. murmurs, gallops, clicks or rubs. Peripheral pulses normal and equal  without edema.  Abdomen: Soft & bowel sounds normal. Non-tender w/o guarding, rebound, hernias, masses or organomegaly.  Lymphatics: Unremarkable.  Musculoskeletal: Full ROM all peripheral extremities, joint stability, 5/5 strength and normal gait.  Skin: Warm, dry without exposed rashes, lesions or ecchymosis apparent.  Neuro: Cranial nerves intact, reflexes equal bilaterally. Sensory-motor testing grossly intact. Tendon reflexes grossly intact.  Pysch: Alert & oriented x 3.  Insight and judgement nl & appropriate. No ideations.  Assessment and Plan:  - Continue medication, monitor blood pressure at home.  - Continue DASH diet. Reminder to go to the ER if any CP,  SOB, nausea, dizziness, severe HA, changes vision/speech.  - Continue diet/meds, exercise,& lifestyle modifications.  - Continue monitor periodic cholesterol/liver & renal functions   - Continue diet, exercise, lifestyle modifications.  - Monitor appropriate labs. - Continue supplementation.      Discussed  regular exercise, BP monitoring, weight control to achieve/maintain BMI less than 25 and discussed med and SE's. Recommended labs to assess and monitor clinical status with further disposition pending results of labs. Over 30 minutes of exam,  counseling, chart review was performed.

## 2016-11-17 ENCOUNTER — Other Ambulatory Visit: Payer: Self-pay | Admitting: Internal Medicine

## 2016-11-22 ENCOUNTER — Other Ambulatory Visit: Payer: Self-pay | Admitting: Internal Medicine

## 2016-11-22 ENCOUNTER — Other Ambulatory Visit: Payer: Self-pay | Admitting: Family Medicine

## 2016-11-22 DIAGNOSIS — N6320 Unspecified lump in the left breast, unspecified quadrant: Secondary | ICD-10-CM

## 2016-11-28 ENCOUNTER — Ambulatory Visit: Payer: Medicare Other

## 2016-11-28 ENCOUNTER — Ambulatory Visit
Admission: RE | Admit: 2016-11-28 | Discharge: 2016-11-28 | Disposition: A | Discharge status: Home / Self Care | Payer: Medicare Other | Source: Ambulatory Visit | Attending: Internal Medicine | Admitting: Internal Medicine

## 2016-11-28 DIAGNOSIS — R928 Other abnormal and inconclusive findings on diagnostic imaging of breast: Secondary | ICD-10-CM | POA: Diagnosis not present

## 2016-11-28 DIAGNOSIS — N6320 Unspecified lump in the left breast, unspecified quadrant: Secondary | ICD-10-CM

## 2016-12-11 ENCOUNTER — Other Ambulatory Visit: Payer: Self-pay | Admitting: Internal Medicine

## 2017-01-09 DIAGNOSIS — H2513 Age-related nuclear cataract, bilateral: Secondary | ICD-10-CM | POA: Diagnosis not present

## 2017-01-09 DIAGNOSIS — H40033 Anatomical narrow angle, bilateral: Secondary | ICD-10-CM | POA: Diagnosis not present

## 2017-01-09 DIAGNOSIS — H40013 Open angle with borderline findings, low risk, bilateral: Secondary | ICD-10-CM | POA: Diagnosis not present

## 2017-01-09 DIAGNOSIS — H25013 Cortical age-related cataract, bilateral: Secondary | ICD-10-CM | POA: Diagnosis not present

## 2017-01-15 ENCOUNTER — Encounter: Payer: Self-pay | Admitting: Internal Medicine

## 2017-01-15 ENCOUNTER — Ambulatory Visit (INDEPENDENT_AMBULATORY_CARE_PROVIDER_SITE_OTHER): Payer: Medicare Other | Admitting: Internal Medicine

## 2017-01-15 VITALS — BP 132/78 | HR 64 | Temp 97.3°F | Resp 18 | Ht 64.5 in | Wt 159.0 lb

## 2017-01-15 DIAGNOSIS — E559 Vitamin D deficiency, unspecified: Secondary | ICD-10-CM | POA: Diagnosis not present

## 2017-01-15 DIAGNOSIS — E782 Mixed hyperlipidemia: Secondary | ICD-10-CM

## 2017-01-15 DIAGNOSIS — R7309 Other abnormal glucose: Secondary | ICD-10-CM | POA: Diagnosis not present

## 2017-01-15 DIAGNOSIS — Z79899 Other long term (current) drug therapy: Secondary | ICD-10-CM

## 2017-01-15 DIAGNOSIS — I10 Essential (primary) hypertension: Secondary | ICD-10-CM | POA: Diagnosis not present

## 2017-01-15 NOTE — Patient Instructions (Signed)

## 2017-01-15 NOTE — Progress Notes (Signed)
This very nice 71 y.o.  [name] presents for 6  month follow up with Hypertension, Hyperlipidemia, Pre-Diabetes and Vitamin D Deficiency. Patient uis foloewed by Dr Chucky May for Bipolar Depressive Disorder. Patient had a daughter who died about a year ago with a drug OD and more recently had her daughter's son / her grandson also died with a drug OD and she's struggling with Depression.      Patient is treated for HTN (1995) & BP has been controlled at home. Today's BP is at goal - 132/78. Patient has had no complaints of any cardiac type chest pain, palpitations, dyspnea/orthopnea/PND, dizziness, claudication, or dependent edema.     Hyperlipidemia is controlled with diet & meds. Patient denies myalgias or other med SE's. Last Lipids were at goal: Lab Results  Component Value Date   CHOL 183 08/08/2016   HDL 62 08/08/2016   LDLCALC 97 08/08/2016   TRIG 118 08/08/2016   CHOLHDL 3.0 08/08/2016      Also, the patient has history of PreDiabetes ("6.0% in 2012) and has had no symptoms of reactive hypoglycemia, diabetic polys, paresthesias or visual blurring.  Last A1c was at goal:  Lab Results  Component Value Date   HGBA1C 5.2 08/08/2016      Further, the patient also has history of Vitamin D Deficiency ("13" in 2010) and supplements vitamin D without any suspected side-effects. Last vitamin D was at goal: Lab Results  Component Value Date   VD25OH 27 05/10/2016   Current Outpatient Prescriptions on File Prior to Visit  Medication Sig  . ALPRAZolam (XANAX) 1 MG tablet Take 1 mg by mouth 3 (three) times daily as needed. For anxiety.  Marland Kitchen aspirin EC 81 MG tablet Take 81 mg by mouth daily.  . bisoprolol-hydrochlorothiazide (ZIAC) 5-6.25 MG tablet take 1 tablet by mouth once daily  . buPROPion (WELLBUTRIN XL) 150 MG 24 hr tablet Take 450 mg by mouth daily.  . Cholecalciferol (VITAMIN D3) 5000 UNITS CAPS Take 5,000 Units by mouth daily.   Marland Kitchen esomeprazole (NEXIUM) 40 MG capsule take 1  capsule by mouth once daily AT NOON  . gabapentin (NEURONTIN) 600 MG tablet Take 600 mg by mouth 4 (four) times daily.  . hydrochlorothiazide (MICROZIDE) 12.5 MG capsule take 1 capsule by mouth once daily for BLOOD PRESSURE AND FLUID  . lamoTRIgine (LAMICTAL) 200 MG tablet   . rosuvastatin (CRESTOR) 40 MG tablet take 1/2 to 1 tablet by mouth once daily  . sertraline (ZOLOFT) 100 MG tablet    No current facility-administered medications on file prior to visit.    Allergies  Allergen Reactions  . Lipitor [Atorvastatin]     Fatigue  . Prednisone Other (See Comments)    Change in mental status   PMHx:   Past Medical History:  Diagnosis Date  . Alcoholism ([place])   . Anxiety   . Depression   . Elevated hemoglobin A1c   . Fracture of right wrist   . GERD (gastroesophageal reflux disease)   . Heart murmur   . Hyperlipidemia   . Hypertension   . IBS (irritable bowel syndrome)    Immunization History  Administered Date(s) Administered  . DT 07/27/2015  . Influenza, High Dose Seasonal PF 01/28/2014  . Influenza-Unspecified 03/15/2011, 01/04/2015, 02/17/2016  . Pneumococcal Conjugate-13 06/09/2014, 07/12/2016  . Pneumococcal-Unspecified 05/14/2008  . Td 05/14/2005  . Zoster 01/23/2012   Past Surgical History:  Procedure Laterality Date  . ABDOMINAL HYSTERECTOMY  1991  . APPENDECTOMY    .  BREAST SURGERY Left 1989   [facility]  . CARPAL TUNNEL RELEASE Right 10/18/2014   Procedure: CARPAL TUNNEL RELEASE;  Surgeon: Dorna Leitz, MD;  Location: [place];  Service: Orthopedics;  Laterality: Right;  . [place]  . NECK SURGERY  Remote    Ruptured disc, per patient. Got infected.   . ORIF WRIST FRACTURE Right 10/18/2014   Procedure: OPEN REDUCTION INTERNAL FIXATION (ORIF) RIGHT WRIST FRACTURE;  Surgeon: Dorna Leitz, MD;  Location: [place];  Service: Orthopedics;  Laterality: Right;  . TONSILLECTOMY  1965  . TUBAL LIGATION     FHx:    Reviewed / unchanged  SHx:    Reviewed /  unchanged  Systems Review:  Constitutional: Denies fever, chills, wt changes, headaches, insomnia, fatigue, night sweats, change in appetite. Eyes: Denies redness, blurred vision, diplopia, discharge, itchy, watery eyes.  ENT: Denies discharge, congestion, post nasal drip, epistaxis, sore throat, earache, hearing loss, dental pain, tinnitus, vertigo, sinus pain, snoring.  CV: Denies chest pain, palpitations, irregular heartbeat, syncope, dyspnea, diaphoresis, orthopnea, PND, claudication or edema. Respiratory: denies cough, dyspnea, DOE, pleurisy, hoarseness, laryngitis, wheezing.  Gastrointestinal: Denies dysphagia, odynophagia, heartburn, reflux, water brash, abdominal pain or cramps, nausea, vomiting, bloating, diarrhea, constipation, hematemesis, melena, hematochezia  or hemorrhoids. Genitourinary: Denies dysuria, frequency, urgency, nocturia, hesitancy, discharge, hematuria or flank pain. Musculoskeletal: Denies arthralgias, myalgias, stiffness, jt. swelling, pain, limping or strain/sprain.  Skin: Denies pruritus, rash, hives, warts, acne, eczema or change in skin lesion(s). Neuro: No weakness, tremor, incoordination, spasms, paresthesia or pain. Psychiatric: Denies confusion, memory loss or sensory loss. Endo: Denies change in weight, skin or hair change.  Heme/Lymph: No excessive bleeding, bruising or enlarged lymph nodes.  Physical Exam  BP 132/78   Pulse 64   Temp (!) 97.3 F (36.3 C)   Resp 18   Ht 5' 4.5" (1.638 m)   Wt 159 lb (72.1 kg)   BMI 26.87 kg/m   Appears well nourished, well groomed  and in no distress.  Eyes: PERRLA, EOMs, conjunctiva no swelling or erythema. Sinuses: No frontal/maxillary tenderness ENT/Mouth: EAC's clear, TM's nl w/o erythema, bulging. Nares clear w/o erythema, swelling, exudates. Oropharynx clear without erythema or exudates. Oral hygiene is good. Tongue normal, non obstructing. Hearing intact.  Neck: Supple. Thyroid nl. Car 2+/2+ without  bruits, nodes or JVD. Chest: Respirations nl with BS clear & equal w/o rales, rhonchi, wheezing or stridor.  Cor: Heart sounds normal w/ regular rate and rhythm without sig. murmurs, gallops, clicks or rubs. Peripheral pulses normal and equal  without edema.  Abdomen: Soft & bowel sounds normal. Non-tender w/o guarding, rebound, hernias, masses or organomegaly.  Lymphatics: Unremarkable.  Musculoskeletal: Full ROM all peripheral extremities, joint stability, 5/5 strength and normal gait.  Skin: Warm, dry without exposed rashes, lesions or ecchymosis apparent.  Neuro: Cranial nerves intact, reflexes equal bilaterally. Sensory-motor testing grossly intact. Tendon reflexes grossly intact.  Pysch: Alert & oriented x 3.  Insight and judgement nl & appropriate. No ideations.  Assessment and Plan:  1. Essential hypertension  - Continue medication, monitor blood pressure at home.  - Continue DASH diet. Reminder to go to the ER if any CP,  SOB, nausea, dizziness, severe HA, changes vision/speech.  - CBC with Differential/Platelet - BASIC METABOLIC PANEL WITH GFR - Magnesium - TSH  2. Hyperlipidemia, mixed  - Continue diet/meds, exercise,& lifestyle modifications.  - Continue monitor periodic cholesterol/liver & renal functions   - Hepatic function panel - Lipid panel - TSH  3.  PreDiabetes  - Continue diet, exercise, lifestyle modifications.  - Monitor appropriate labs.  - Hemoglobin A1c - Insulin, fasting  4. Vitamin D deficiency  - Continue supplementation  - VITAMIN D 25 Hydroxy   5. Other abnormal glucose  - Hemoglobin A1c - Insulin, fasting  6. Medication management  - CBC with Differential/Platelet - BASIC METABOLIC PANEL WITH GFR - Hepatic function panel - Magnesium - Lipid panel - TSH - Hemoglobin A1c - Insulin, fasting - VITAMIN D 25 Hydroxy         Discussed  regular exercise, BP monitoring, weight control to achieve/maintain BMI less than 25 and  discussed med and SE's. Recommended labs to assess and monitor clinical status with further disposition pending results of labs. Over 30 minutes of exam, counseling, chart review was performed. Patient encouraged to f/u w/ Hospice for grief counseling

## 2017-01-16 LAB — BASIC METABOLIC PANEL WITH GFR
BUN / CREAT RATIO: 26 (calc) — AB (ref 6–22)
BUN: 27 mg/dL — ABNORMAL HIGH (ref 7–25)
CHLORIDE: 103 mmol/L (ref 98–110)
CO2: 29 mmol/L (ref 20–32)
Calcium: 9.4 mg/dL (ref 8.6–10.4)
Creat: 1.05 mg/dL — ABNORMAL HIGH (ref 0.60–0.93)
GFR, Est African American: 61 mL/min/{1.73_m2} (ref >=60)
GFR, Est Non African American: 53 mL/min/{1.73_m2} — ABNORMAL LOW (ref >=60)
GLUCOSE: 90 mg/dL (ref 65–99)
Potassium: 4.1 mmol/L (ref 3.5–5.3)
SODIUM: 143 mmol/L (ref 135–146)

## 2017-01-16 LAB — LIPID PANEL
CHOLESTEROL: 176 mg/dL (ref <200)
HDL: 63 mg/dL (ref >50)
LDL CHOLESTEROL (CALC): 90 mg/dL
Non-HDL Cholesterol (Calc): 113 mg/dL (calc) (ref <130)
TRIGLYCERIDES: 124 mg/dL (ref <150)
Total CHOL/HDL Ratio: 2.8 (calc) (ref <5.0)

## 2017-01-16 LAB — HEPATIC FUNCTION PANEL
AG Ratio: 2.5 (calc) (ref 1.0–2.5)
ALBUMIN MSPROF: 4.7 g/dL (ref 3.6–5.1)
ALT: 10 U/L (ref 6–29)
AST: 15 U/L (ref 10–35)
Alkaline phosphatase (APISO): 50 U/L (ref 33–130)
BILIRUBIN DIRECT: 0.1 mg/dL (ref 0.0–0.2)
GLOBULIN: 1.9 g/dL (ref 1.9–3.7)
Indirect Bilirubin: 0.3 mg/dL (calc) (ref 0.2–1.2)
TOTAL PROTEIN: 6.6 g/dL (ref 6.1–8.1)
Total Bilirubin: 0.4 mg/dL (ref 0.2–1.2)

## 2017-01-16 LAB — CBC WITH DIFFERENTIAL/PLATELET
BASOS ABS: 60 {cells}/uL (ref 0–200)
Basophils Relative: 0.9 %
EOS PCT: 1.9 %
Eosinophils Absolute: 127 cells/uL (ref 15–500)
HCT: 39 % (ref 35.0–45.0)
HEMOGLOBIN: 12.9 g/dL (ref 11.7–15.5)
Lymphs Abs: 1219 cells/uL (ref 850–3900)
MCH: 28.9 pg (ref 27.0–33.0)
MCHC: 33.1 g/dL (ref 32.0–36.0)
MCV: 87.4 fL (ref 80.0–100.0)
MONOS PCT: 5.9 %
MPV: 11.7 fL (ref 7.5–12.5)
NEUTROS ABS: 4898 {cells}/uL (ref 1500–7800)
NEUTROS PCT: 73.1 %
PLATELETS: 178 10*3/uL (ref 140–400)
RBC: 4.46 10*6/uL (ref 3.80–5.10)
RDW: 12.3 % (ref 11.0–15.0)
TOTAL LYMPHOCYTE: 18.2 %
WBC mixed population: 395 cells/uL (ref 200–950)
WBC: 6.7 10*3/uL (ref 3.8–10.8)

## 2017-01-16 LAB — VITAMIN D 25 HYDROXY (VIT D DEFICIENCY, FRACTURES): VIT D 25 HYDROXY: 95 ng/mL (ref 30–100)

## 2017-01-16 LAB — HEMOGLOBIN A1C
HEMOGLOBIN A1C: 5.6 %{Hb} (ref <5.7)
Mean Plasma Glucose: 114 (calc)
eAG (mmol/L): 6.3 (calc)

## 2017-01-16 LAB — INSULIN, FASTING: INSULIN: 5.2 u[IU]/mL (ref 2.0–19.6)

## 2017-01-16 LAB — MAGNESIUM: MAGNESIUM: 2.1 mg/dL (ref 1.5–2.5)

## 2017-01-16 LAB — TSH: TSH: 1.65 mIU/L (ref 0.40–4.50)

## 2017-02-06 ENCOUNTER — Encounter: Payer: Self-pay | Admitting: Internal Medicine

## 2017-02-06 ENCOUNTER — Other Ambulatory Visit: Payer: Self-pay

## 2017-03-13 ENCOUNTER — Other Ambulatory Visit: Payer: Self-pay | Admitting: Internal Medicine

## 2017-04-23 DIAGNOSIS — N182 Chronic kidney disease, stage 2 (mild): Secondary | ICD-10-CM | POA: Insufficient documentation

## 2017-04-23 NOTE — Progress Notes (Deleted)
MEDICARE ANNUAL WELLNESS VISIT AND FOLLOW UP  Assessment:   Diagnoses and all orders for this visit:  Encounter for Medicare annual wellness exam  Essential hypertension Continue medication Monitor blood pressure at home; call if consistently over 130/80 Continue DASH diet.   Reminder to go to the ER if any CP, SOB, nausea, dizziness, severe HA, changes vision/speech, left arm numbness and tingling and jaw pain.  Gastroesophageal reflux disease, esophagitis presence not specified Well managed on current medications Discussed diet, avoiding triggers and other lifestyle changes -     Magnesium  OAB (overactive bladder)   Hyperlipidemia, mixed Continue medications Continue low cholesterol diet and exercise.  -     Lipid panel -     TSH  Recurrent major depressive disorder, in partial remission (HCC) Continue medications and follow up with Dr. Toy Care Lifestyle discussed: diet/exerise, sleep hygiene, stress management, hydration  Alcoholism ([place]) *** -     Magnesium  Other abnormal glucose Discussed disease and risks Discussed diet/exercise, weight management  -     Hemoglobin A1c  Vitamin D deficiency Continue supplementation -     VITAMIN D 25 Hydroxy (Vit-D Deficiency, Fractures)  Medication management -     CBC with Differential/Platelet -     BASIC METABOLIC PANEL WITH GFR -     Hepatic function panel  Chronic kidney disease, stage 3 (HCC) Increase fluids, avoid NSAIDS, discouraged ETOH use, monitor sugars, will monitor -     BASIC METABOLIC PANEL WITH GFR   Over 40 minutes of exam, counseling, chart review and critical decision making was performed Future Appointments  Date Time Provider [place]  04/24/2017  2:30 PM Liane Comber, NP [facility] None  08/07/2017  3:45 PM Unk Pinto, MD [facility] None    Plan:   During the course of the visit the patient was educated and counseled about appropriate screening and preventive services  including:    Pneumococcal vaccine   Prevnar 13  Influenza vaccine  Td vaccine  Screening electrocardiogram  Bone densitometry screening  Colorectal cancer screening  Diabetes screening  Glaucoma screening  Nutrition counseling   Advanced directives: requested   Subjective:  Angelica Kelley is a 71 y.o. female who presents for Medicare Annual Wellness Visit and 3 month follow up.    Her blood pressure {HAS HAS NOT:18834} been controlled at home, today their BP is   She {DOES_DOES LFY:10175} workout. She denies chest pain, shortness of breath, dizziness.   She is on cholesterol medication and denies myalgias. Her cholesterol is at goal. The cholesterol last visit was:   Lab Results  Component Value Date   CHOL 176 01/15/2017   HDL 63 01/15/2017   LDLCALC 97 08/08/2016   TRIG 124 01/15/2017   CHOLHDL 2.8 01/15/2017    She {Has/has not:18111} been working on diet and exercise for prediabetes, and denies {Symptoms; diabetes w/o none:19199}. Last A1C in the office was:  Lab Results  Component Value Date   HGBA1C 5.6 01/15/2017   Last GFR: Lab Results  Component Value Date   GFRNONAA 53 (L) 01/15/2017   Patient is on Vitamin D supplement and at goal at last visit:    Lab Results  Component Value Date   VD25OH 95 01/15/2017      Medication Review: Current Outpatient Medications on File Prior to Visit  Medication Sig Dispense Refill  . ALPRAZolam (XANAX) 1 MG tablet Take 1 mg by mouth 3 (three) times daily as needed. For anxiety.    Marland Kitchen  aspirin EC 81 MG tablet Take 81 mg by mouth daily.    . bisoprolol-hydrochlorothiazide (ZIAC) 5-6.25 MG tablet take 1 tablet by mouth once daily 90 tablet 1  . buPROPion (WELLBUTRIN XL) 150 MG 24 hr tablet Take 450 mg by mouth daily.    . Cholecalciferol (VITAMIN D3) 5000 UNITS CAPS Take 5,000 Units by mouth daily.     Marland Kitchen esomeprazole (NEXIUM) 40 MG capsule take 1 capsule by mouth once daily AT NOON 90 capsule 0  . gabapentin  (NEURONTIN) 600 MG tablet Take 600 mg by mouth 4 (four) times daily.  0  . hydrochlorothiazide (MICROZIDE) 12.5 MG capsule take 1 capsule by mouth once daily for blood pressure and FLUID 90 capsule 1  . lamoTRIgine (LAMICTAL) 200 MG tablet   0  . rosuvastatin (CRESTOR) 40 MG tablet take 1/2 to 1 tablet by mouth once daily 90 tablet 1  . sertraline (ZOLOFT) 100 MG tablet   0   No current facility-administered medications on file prior to visit.     Allergies  Allergen Reactions  . Lipitor [Atorvastatin]     Fatigue  . Prednisone Other (See Comments)    Change in mental status    Current Problems (verified) Patient Active Problem List   Diagnosis Date Noted  . Chronic kidney disease, stage 3 ([place]) 04/23/2017  . Encounter for Medicare annual wellness exam 04/30/2015  . OAB (overactive bladder) 06/09/2014  . Vitamin D deficiency 10/27/2013  . Medication management 10/27/2013  . Hypertension   . Hyperlipidemia, mixed   . GERD (gastroesophageal reflux disease)   . Major depression in partial remission ([place])   . Alcoholism ([place])   . IBS (irritable bowel syndrome)   . Other abnormal glucose     Screening Tests Immunization History  Administered Date(s) Administered  . DT 07/27/2015  . Influenza Split 02/22/2017  . Influenza, High Dose Seasonal PF 01/28/2014  . Influenza-Unspecified 03/15/2011, 01/04/2015, 02/17/2016  . Pneumococcal Conjugate-13 06/09/2014, 07/12/2016  . Pneumococcal-Unspecified 05/14/2008  . Td 05/14/2005  . Zoster 01/23/2012  . Zoster Recombinat (Shingrix) 02/22/2017   Preventative care: Last colonoscopy: 2013 Last mammogram: 2018 Last pap smear/pelvic exam: ***   DEXA: 2011  Prior vaccinations: TD or Tdap: 2017  Influenza: 2018 Pneumococcal: 2010 Prevnar13: 2016, 2018 Shingles/Zostavax: 2013, 2018 (shingrix) -2nd dose?***  Names of Other Physician/Practitioners you currently use: 1. [facility] here for  primary care 2. ***, eye doctor, last visit *** 3. ***, dentist, last visit *** Patient Care Team: Unk Pinto, MD as PCP - General (Internal Medicine) Richmond Campbell, MD as Consulting Physician (Gastroenterology) Chucky May, MD as Consulting Physician (Psychiatry) Dorna Leitz, MD as Consulting Physician (Orthopedic Surgery)  SURGICAL HISTORY She  has a past surgical history that includes Neck surgery (Remote ); Abdominal hysterectomy (1991); Breast surgery (Left, 1989); Tonsillectomy (1965); Cervical laminectomy (1977); Appendectomy; Tubal ligation; ORIF wrist fracture (Right, 10/18/2014); and Carpal tunnel release (Right, 10/18/2014). FAMILY HISTORY Her family history includes Cancer in her brother; Diabetes in her mother; Heart disease in her brother and mother; Leukemia in her father; Parkinson's disease in her brother. SOCIAL HISTORY She  reports that  has never smoked. she has never used smokeless tobacco. She reports that she does not drink alcohol or use drugs.   MEDICARE WELLNESS OBJECTIVES: Physical activity:   Cardiac risk factors:   Depression/mood screen:   Depression screen [facility] 2/9 01/15/2017  Decreased Interest 0  Down, Depressed, Hopeless 0  PHQ - 2 Score 0  Altered sleeping -  Tired, decreased energy -  Change in appetite -  Feeling bad or failure about yourself  -  Trouble concentrating -  Moving slowly or fidgety/restless -  Suicidal thoughts -  PHQ-9 Score -  Difficult doing work/chores -    ADLs:  In your present state of health, do you have any difficulty performing the following activities: 01/15/2017 05/11/2016  Hearing? N N  Vision? N N  Difficulty concentrating or making decisions? N N  Walking or climbing stairs? N N  Dressing or bathing? N N  Doing errands, shopping? N N  Some recent data might be hidden     Cognitive Testing  Alert? Yes  Normal Appearance?Yes  Oriented to person? Yes  Place? Yes   Time? Yes  Recall of three objects?   Yes  Can perform simple calculations? Yes  Displays appropriate judgment?Yes  Can read the correct time from a watch face?Yes  EOL planning:    ROS   Objective:     There were no vitals filed for this visit. There is no height or weight on file to calculate BMI.  General appearance: alert, no distress, WD/WN, female HEENT: normocephalic, sclerae anicteric, TMs pearly, nares patent, no discharge or erythema, pharynx normal Oral cavity: MMM, no lesions Neck: supple, no lymphadenopathy, no thyromegaly, no masses Heart: RRR, normal S1, S2, no murmurs Lungs: CTA bilaterally, no wheezes, rhonchi, or rales Abdomen: +bs, soft, non tender, non distended, no masses, no hepatomegaly, no splenomegaly Musculoskeletal: nontender, no swelling, no obvious deformity Extremities: no edema, no cyanosis, no clubbing Pulses: 2+ symmetric, upper and lower extremities, normal cap refill Neurological: alert, oriented x 3, CN2-12 intact, strength normal upper extremities and lower extremities, sensation normal throughout, DTRs 2+ throughout, no cerebellar signs, gait normal Psychiatric: normal affect, behavior normal, pleasant   Medicare Attestation I have personally reviewed: The patient's medical and social history Their use of alcohol, tobacco or illicit drugs Their current medications and supplements The patient's functional ability including ADLs,fall risks, home safety risks, cognitive, and hearing and visual impairment Diet and physical activities Evidence for depression or mood disorders  The patient's weight, height, BMI, and visual acuity have been recorded in the chart.  I have made referrals, counseling, and provided education to the patient based on review of the above and I have provided the patient with a written personalized care plan for preventive services.     Izora Ribas, NP   04/23/2017

## 2017-04-24 ENCOUNTER — Ambulatory Visit: Payer: Self-pay | Admitting: Adult Health

## 2017-05-09 ENCOUNTER — Encounter (HOSPITAL_COMMUNITY): Payer: Self-pay | Admitting: Emergency Medicine

## 2017-05-09 ENCOUNTER — Emergency Department (HOSPITAL_COMMUNITY): Payer: Medicare Other

## 2017-05-09 ENCOUNTER — Observation Stay (HOSPITAL_COMMUNITY)
Admission: EM | Admit: 2017-05-09 | Discharge: 2017-05-11 | Disposition: A | Discharge status: Home / Self Care | Payer: Medicare Other | Attending: Internal Medicine | Admitting: Internal Medicine

## 2017-05-09 DIAGNOSIS — Y999 Unspecified external cause status: Secondary | ICD-10-CM | POA: Insufficient documentation

## 2017-05-09 DIAGNOSIS — M25522 Pain in left elbow: Secondary | ICD-10-CM | POA: Diagnosis not present

## 2017-05-09 DIAGNOSIS — S0990XA Unspecified injury of head, initial encounter: Secondary | ICD-10-CM | POA: Diagnosis not present

## 2017-05-09 DIAGNOSIS — I129 Hypertensive chronic kidney disease with stage 1 through stage 4 chronic kidney disease, or unspecified chronic kidney disease: Secondary | ICD-10-CM | POA: Diagnosis not present

## 2017-05-09 DIAGNOSIS — Y939 Activity, unspecified: Secondary | ICD-10-CM | POA: Diagnosis not present

## 2017-05-09 DIAGNOSIS — N179 Acute kidney failure, unspecified: Secondary | ICD-10-CM | POA: Diagnosis not present

## 2017-05-09 DIAGNOSIS — Y92018 Other place in single-family (private) house as the place of occurrence of the external cause: Secondary | ICD-10-CM | POA: Insufficient documentation

## 2017-05-09 DIAGNOSIS — Z9181 History of falling: Secondary | ICD-10-CM | POA: Diagnosis not present

## 2017-05-09 DIAGNOSIS — W19XXXA Unspecified fall, initial encounter: Secondary | ICD-10-CM | POA: Insufficient documentation

## 2017-05-09 DIAGNOSIS — N183 Chronic kidney disease, stage 3 (moderate): Secondary | ICD-10-CM | POA: Diagnosis not present

## 2017-05-09 DIAGNOSIS — M6281 Muscle weakness (generalized): Secondary | ICD-10-CM | POA: Diagnosis not present

## 2017-05-09 DIAGNOSIS — F1021 Alcohol dependence, in remission: Secondary | ICD-10-CM | POA: Insufficient documentation

## 2017-05-09 DIAGNOSIS — R4781 Slurred speech: Secondary | ICD-10-CM | POA: Diagnosis not present

## 2017-05-09 DIAGNOSIS — I10 Essential (primary) hypertension: Secondary | ICD-10-CM | POA: Diagnosis present

## 2017-05-09 DIAGNOSIS — F418 Other specified anxiety disorders: Secondary | ICD-10-CM | POA: Diagnosis present

## 2017-05-09 DIAGNOSIS — N182 Chronic kidney disease, stage 2 (mild): Secondary | ICD-10-CM

## 2017-05-09 DIAGNOSIS — E782 Mixed hyperlipidemia: Secondary | ICD-10-CM

## 2017-05-09 DIAGNOSIS — Z7982 Long term (current) use of aspirin: Secondary | ICD-10-CM | POA: Insufficient documentation

## 2017-05-09 DIAGNOSIS — E785 Hyperlipidemia, unspecified: Secondary | ICD-10-CM | POA: Insufficient documentation

## 2017-05-09 DIAGNOSIS — K219 Gastro-esophageal reflux disease without esophagitis: Secondary | ICD-10-CM | POA: Diagnosis not present

## 2017-05-09 DIAGNOSIS — F1020 Alcohol dependence, uncomplicated: Secondary | ICD-10-CM | POA: Diagnosis present

## 2017-05-09 DIAGNOSIS — S064X0A Epidural hemorrhage without loss of consciousness, initial encounter: Secondary | ICD-10-CM | POA: Diagnosis not present

## 2017-05-09 DIAGNOSIS — R2681 Unsteadiness on feet: Secondary | ICD-10-CM | POA: Diagnosis not present

## 2017-05-09 DIAGNOSIS — S199XXA Unspecified injury of neck, initial encounter: Secondary | ICD-10-CM | POA: Diagnosis not present

## 2017-05-09 LAB — COMPREHENSIVE METABOLIC PANEL
ALT: 17 U/L (ref 14–54)
AST: 48 U/L — ABNORMAL HIGH (ref 15–41)
Albumin: 3.7 g/dL (ref 3.5–5.0)
Alkaline Phosphatase: 57 U/L (ref 38–126)
Anion gap: 10 (ref 5–15)
BILIRUBIN TOTAL: 1.3 mg/dL — AB (ref 0.3–1.2)
BUN: 19 mg/dL (ref 6–20)
CHLORIDE: 101 mmol/L (ref 101–111)
CO2: 28 mmol/L (ref 22–32)
CREATININE: 1.22 mg/dL — AB (ref 0.44–1.00)
Calcium: 9.3 mg/dL (ref 8.9–10.3)
GFR, EST AFRICAN AMERICAN: 50 mL/min — AB (ref >60)
GFR, EST NON AFRICAN AMERICAN: 43 mL/min — AB (ref >60)
Glucose, Bld: 103 mg/dL — ABNORMAL HIGH (ref 65–99)
Potassium: 4.8 mmol/L (ref 3.5–5.1)
Sodium: 139 mmol/L (ref 135–145)
TOTAL PROTEIN: 6.4 g/dL — AB (ref 6.5–8.1)

## 2017-05-09 LAB — I-STAT CHEM 8, ED
BUN: 25 mg/dL — ABNORMAL HIGH (ref 6–20)
CALCIUM ION: 1.11 mmol/L — AB (ref 1.15–1.40)
Chloride: 100 mmol/L — ABNORMAL LOW (ref 101–111)
Creatinine, Ser: 1.2 mg/dL — ABNORMAL HIGH (ref 0.44–1.00)
GLUCOSE: 105 mg/dL — AB (ref 65–99)
HCT: 34 % — ABNORMAL LOW (ref 36.0–46.0)
HEMOGLOBIN: 11.6 g/dL — AB (ref 12.0–15.0)
Potassium: 4.6 mmol/L (ref 3.5–5.1)
Sodium: 140 mmol/L (ref 135–145)
TCO2: 31 mmol/L (ref 22–32)

## 2017-05-09 LAB — URINALYSIS, ROUTINE W REFLEX MICROSCOPIC
Bilirubin Urine: NEGATIVE
Glucose, UA: NEGATIVE mg/dL
HGB URINE DIPSTICK: NEGATIVE
Ketones, ur: NEGATIVE mg/dL
LEUKOCYTES UA: NEGATIVE
NITRITE: NEGATIVE
PROTEIN: NEGATIVE mg/dL
SPECIFIC GRAVITY, URINE: 1.019 (ref 1.005–1.030)
pH: 7 (ref 5.0–8.0)

## 2017-05-09 LAB — RAPID URINE DRUG SCREEN, HOSP PERFORMED
Amphetamines: NOT DETECTED
BARBITURATES: NOT DETECTED
BENZODIAZEPINES: POSITIVE — AB
Cocaine: NOT DETECTED
Opiates: NOT DETECTED
Tetrahydrocannabinol: NOT DETECTED

## 2017-05-09 LAB — CBC
HEMATOCRIT: 36.2 % (ref 36.0–46.0)
Hemoglobin: 11.5 g/dL — ABNORMAL LOW (ref 12.0–15.0)
MCH: 29.9 pg (ref 26.0–34.0)
MCHC: 31.8 g/dL (ref 30.0–36.0)
MCV: 94 fL (ref 78.0–100.0)
Platelets: 217 10*3/uL (ref 150–400)
RBC: 3.85 MIL/uL — ABNORMAL LOW (ref 3.87–5.11)
RDW: 14.5 % (ref 11.5–15.5)
WBC: 6.3 10*3/uL (ref 4.0–10.5)

## 2017-05-09 LAB — DIFFERENTIAL
BASOS PCT: 1 %
Basophils Absolute: 0 10*3/uL (ref 0.0–0.1)
EOS ABS: 0.2 10*3/uL (ref 0.0–0.7)
Eosinophils Relative: 4 %
LYMPHS ABS: 1.3 10*3/uL (ref 0.7–4.0)
Lymphocytes Relative: 20 %
MONO ABS: 0.4 10*3/uL (ref 0.1–1.0)
MONOS PCT: 7 %
NEUTROS ABS: 4.4 10*3/uL (ref 1.7–7.7)
Neutrophils Relative %: 68 %

## 2017-05-09 LAB — PROTIME-INR
INR: 0.95
Prothrombin Time: 12.6 seconds (ref 11.4–15.2)

## 2017-05-09 LAB — I-STAT TROPONIN, ED: TROPONIN I, POC: 0.02 ng/mL (ref 0.00–0.08)

## 2017-05-09 LAB — I-STAT CG4 LACTIC ACID, ED: LACTIC ACID, VENOUS: 0.47 mmol/L — AB (ref 0.5–1.9)

## 2017-05-09 LAB — APTT: aPTT: 26 seconds (ref 24–36)

## 2017-05-09 LAB — ETHANOL

## 2017-05-09 LAB — CBG MONITORING, ED: GLUCOSE-CAPILLARY: 87 mg/dL (ref 65–99)

## 2017-05-09 NOTE — ED Notes (Signed)
Patient transported to CT 

## 2017-05-09 NOTE — ED Notes (Signed)
Called lab to check on results. Stated they should have resulted. RN stated the results are not crossing over.

## 2017-05-09 NOTE — H&P (Signed)
History and Physical    Angelica Kelley:[id number] DOB: December 05, 1945 DOA: 05/09/2017  Referring MD/NP/PA:   PCP: Unk Pinto, MD   Patient coming from:  The patient is coming from home.  At baseline, pt is independent for most of ADL.   Chief Complaint: fall and slurred speech  HPI: Angelica Kelley is a 71 y.o. female with medical history significant of hypertension, hyperlipidemia, GERD, depression, anxiety, CKD-2, alcohol abuse in remission, who presents with fall and slurred speech  Per report, pt was found on the laundry room floor. EMS was called, which time family reported that the patient was having slurred speech. EMS states that she had difficulty walking and some confusion. Pt said she did not have LOC, but could not tell when this happened and what happened. She states that she has been feeling lightheaded and fell. She has skin tear in left elbow and abrasion in left forehead. When I saw pt in ED, she feels normal. She is alert, oriented 3. Her slurred speech has resolved. No unilateral weakness, numbness or tingling to extremities. No facial droop, vision change or hearing loss. Patient denies chest pain, cough, shortness breath, fever, chills. No nausea, vomiting, diarrhea or abdominal pain. Denies symptoms of UTI.  ED Course: pt was found to have WBC 6.3, lactic acid is 0.47, negative troponin, positive UDS for benzo, worsening renal function, alcohol level less than 10, temperature normal, bradycardia, oxygen saturation 93% on room air. X-ray of left elbow is negative. CT of head and C-spine is negative for acute abnormalities. Patient is placed on telemetry bed for observation.  Review of Systems:   General: no fevers, chills, no body weight gain, has fatigue HEENT: no blurry vision, hearing changes or sore throat Respiratory: no dyspnea, coughing, wheezing CV: no chest pain, no palpitations GI: no nausea, vomiting, abdominal pain, diarrhea, constipation GU: no dysuria,  burning on urination, increased urinary frequency, hematuria  Ext: no leg edema Neuro: no unilateral weakness, numbness, or tingling, no vision change or hearing loss. Had fall and slurred speech. Skin: has skin tear in left elbow. MSK: No muscle spasm, no deformity, no limitation of range of movement in spin Heme: No easy bruising.  Travel history: No recent long distant travel.  Allergy:  Allergies  Allergen Reactions  . Lipitor [Atorvastatin]     Fatigue  . Prednisone Other (See Comments)    Change in mental status    Past Medical History:  Diagnosis Date  . Alcoholism ([place])   . Anxiety   . Depression   . Elevated hemoglobin A1c   . Fracture of right wrist   . GERD (gastroesophageal reflux disease)   . Heart murmur   . Hyperlipidemia   . Hypertension   . IBS (irritable bowel syndrome)     Past Surgical History:  Procedure Laterality Date  . ABDOMINAL HYSTERECTOMY  1991  . APPENDECTOMY    . BREAST SURGERY Left 1989   [facility]  . CARPAL TUNNEL RELEASE Right 10/18/2014   Procedure: CARPAL TUNNEL RELEASE;  Surgeon: Dorna Leitz, MD;  Location: [place];  Service: Orthopedics;  Laterality: Right;  . [place]  . NECK SURGERY  Remote    Ruptured disc, per patient. Got infected.   . ORIF WRIST FRACTURE Right 10/18/2014   Procedure: OPEN REDUCTION INTERNAL FIXATION (ORIF) RIGHT WRIST FRACTURE;  Surgeon: Dorna Leitz, MD;  Location: [place];  Service: Orthopedics;  Laterality: Right;  . TONSILLECTOMY  1965  . TUBAL LIGATION  Social History:  reports that  has never smoked. she has never used smokeless tobacco. She reports that she does not drink alcohol or use drugs.  Family History:  Family History  Problem Relation Age of Onset  . Heart disease Mother   . Diabetes Mother   . Leukemia Father   . Heart disease Brother   . Cancer Brother   . Parkinson's disease Brother      Prior to Admission medications   Medication Sig Start Date End Date Taking?  Authorizing Provider  ALPRAZolam Duanne Moron) 1 MG tablet Take 1 mg by mouth 3 (three) times daily as needed for anxiety. For anxiety.    Yes [provider]  aspirin EC 81 MG tablet Take 81 mg by mouth daily.   Yes [provider]  bisoprolol-hydrochlorothiazide [facility]) 5-6.25 MG tablet take 1 tablet by mouth once daily 03/13/17  Yes Unk Pinto, MD  buPROPion (WELLBUTRIN XL) 150 MG 24 hr tablet Take 450 mg by mouth daily.   Yes [provider]  Cholecalciferol (VITAMIN D3) 5000 UNITS CAPS Take 5,000 Units by mouth daily.    Yes [provider]  gabapentin (NEURONTIN) 600 MG tablet Take 300 mg by mouth at bedtime.  10/26/16  Yes [provider]  hydrochlorothiazide (MICROZIDE) 12.5 MG capsule take 1 capsule by mouth once daily for blood pressure and FLUID 03/13/17  Yes Unk Pinto, MD  lamoTRIgine (LAMICTAL) 200 MG tablet Take 400 mg by mouth daily.  04/21/15  Yes [provider]  rosuvastatin (CRESTOR) 40 MG tablet take 1/2 to 1 tablet by mouth once daily Patient taking differently: take 20 MG by mouth once daily 03/13/17  Yes Unk Pinto, MD  sertraline (ZOLOFT) 100 MG tablet Take 200 mg by mouth daily.  04/11/15  Yes [provider]  esomeprazole (NEXIUM) 40 MG capsule take 1 capsule by mouth once daily AT NOON Patient not taking: Reported on 05/09/2017 12/11/16   Unk Pinto, MD    Physical Exam: Vitals:   05/10/17 0011 05/10/17 0100 05/10/17 0151 05/10/17 0200  BP: (!) 103/58 95/76 99/85  (!) 102/45  Pulse: (!) 57 (!) 54 63 (!) 59  Resp: 15 14 18 16   Temp:   98.3 F (36.8 C)   TempSrc:   Oral   SpO2: 96% 94% 95% 95%  Weight:      Height:   5\' 6"  (1.676 m)    General: Not in acute distress HEENT:       Eyes: PERRL, EOMI, no scleral icterus.       ENT: No discharge from the ears and nose, no pharynx injection, no tonsillar enlargement.        Neck: No JVD, no bruit, no mass felt. Heme: No neck lymph node  enlargement. Cardiac: D7/O2, RRR, 3/6 systolic murmurs, No gallops or rubs. Respiratory: No rales, wheezing, rhonchi or rubs. GI: Soft, nondistended, nontender, no rebound pain, no organomegaly, BS present. GU: No hematuria Ext: No pitting leg edema bilaterally. 2+DP/PT pulse bilaterally. Musculoskeletal: No joint deformities, No joint redness or warmth, no limitation of ROM in spin. Skin: has skin teat in left elbow. Neuro: Alert, oriented X3, cranial nerves II-XII grossly intact, moves all extremities normally. Muscle strength 5/5 in all extremities, sensation to light touch intact. Brachial reflex 2+ bilaterally. Psych: Patient is not psychotic, no suicidal or hemocidal ideation.  Labs on Admission: I have personally reviewed following labs and imaging studies  CBC: Recent Labs  Lab 05/09/17 2116 05/09/17 2122  WBC 6.3  --  NEUTROABS 4.4  --   HGB 11.5* 11.6*  HCT 36.2 34.0*  MCV 94.0  --   PLT 217  --    Basic Metabolic Panel: Recent Labs  Lab 05/09/17 2116 05/09/17 2122  NA 139 140  K 4.8 4.6  CL 101 100*  CO2 28  --   GLUCOSE 103* 105*  BUN 19 25*  CREATININE 1.22* 1.20*  CALCIUM 9.3  --    GFR: Estimated Creatinine Clearance: 39.1 mL/min (A) (by C-G formula based on SCr of 1.2 mg/dL (H)). Liver Function Tests: Recent Labs  Lab 05/09/17 2116  AST 48*  ALT 17  ALKPHOS 57  BILITOT 1.3*  PROT 6.4*  ALBUMIN 3.7   No results for input(s): LIPASE, AMYLASE in the last 168 hours. No results for input(s): AMMONIA in the last 168 hours. Coagulation Profile: Recent Labs  Lab 05/09/17 2116  INR 0.95   Cardiac Enzymes: No results for input(s): CKTOTAL, CKMB, CKMBINDEX, TROPONINI in the last 168 hours. BNP (last 3 results) No results for input(s): PROBNP in the last 8760 hours. HbA1C: No results for input(s): HGBA1C in the last 72 hours. CBG: Recent Labs  Lab 05/09/17 2113  GLUCAP 87   Lipid Profile: No results for input(s): CHOL, HDL, LDLCALC, TRIG,  CHOLHDL, LDLDIRECT in the last 72 hours. Thyroid Function Tests: No results for input(s): TSH, T4TOTAL, FREET4, T3FREE, THYROIDAB in the last 72 hours. Anemia Panel: No results for input(s): VITAMINB12, FOLATE, FERRITIN, TIBC, IRON, RETICCTPCT in the last 72 hours. Urine analysis:    Component Value Date/Time   COLORURINE YELLOW 05/09/2017 2114   APPEARANCEUR CLEAR 05/09/2017 2114   LABSPEC 1.019 05/09/2017 2114   PHURINE 7.0 05/09/2017 2114   GLUCOSEU NEGATIVE 05/09/2017 2114   HGBUR NEGATIVE 05/09/2017 2114   BILIRUBINUR NEGATIVE 05/09/2017 2114   [place] NEGATIVE 05/09/2017 2114   PROTEINUR NEGATIVE 05/09/2017 2114   UROBILINOGEN 1 06/09/2014 1531   NITRITE NEGATIVE 05/09/2017 2114   LEUKOCYTESUR NEGATIVE 05/09/2017 2114   Sepsis Labs: @LABRCNTIP (procalcitonin:4,lacticidven:4) )No results found for this or any previous visit (from the past 240 hour(s)).   Radiological Exams on Admission: Dg Elbow 2 Views Left  Result Date: 05/09/2017 CLINICAL DATA:  Left elbow pain EXAM: LEFT ELBOW - 2 VIEW COMPARISON:  None. FINDINGS: No fracture or dislocation. No large elbow effusion. Soft tissues are unremarkable IMPRESSION: No acute osseous abnormality Electronically Signed   By: Donavan Foil M.D.   On: 05/09/2017 22:41   Ct Head Wo Contrast  Result Date: 05/09/2017 CLINICAL DATA:  71 year old female with unwitnessed fall. EXAM: CT HEAD WITHOUT CONTRAST CT CERVICAL SPINE WITHOUT CONTRAST TECHNIQUE: Multidetector CT imaging of the head and cervical spine was performed following the standard protocol without intravenous contrast. Multiplanar CT image reconstructions of the cervical spine were also generated. COMPARISON:  Head CT dated 11/05/2015 FINDINGS: CT HEAD FINDINGS Brain: Mild age-related atrophy and chronic microvascular ischemic changes. There is no acute intracranial hemorrhage. No mass effect or midline shift. No extra-axial fluid collection. Vascular: No hyperdense vessel or  unexpected calcification. Skull: Normal. Negative for fracture or focal lesion. Sinuses/Orbits: No acute finding. Other: None. CT CERVICAL SPINE FINDINGS Alignment: No acute subluxation. Skull base and vertebrae: No acute fracture.  Osteopenia. Soft tissues and spinal canal: No prevertebral fluid or swelling. No visible canal hematoma. Disc levels: Multilevel degenerative changes and disc disease. Multilevel endplate irregularity and disc space narrowing. Upper chest: Atherosclerotic calcification of the aortic arch. Other: Bilateral carotid bulb calcified plaques. IMPRESSION: 1. No acute  intracranial hemorrhage. 2. Mild age-related atrophy and chronic microvascular ischemic changes. 3. No acute/traumatic cervical spine pathology. Multilevel degenerative changes. Electronically Signed   By: Anner Crete M.D.   On: 05/09/2017 22:04   Ct Cervical Spine Wo Contrast  Result Date: 05/09/2017 CLINICAL DATA:  71 year old female with unwitnessed fall. EXAM: CT HEAD WITHOUT CONTRAST CT CERVICAL SPINE WITHOUT CONTRAST TECHNIQUE: Multidetector CT imaging of the head and cervical spine was performed following the standard protocol without intravenous contrast. Multiplanar CT image reconstructions of the cervical spine were also generated. COMPARISON:  Head CT dated 11/05/2015 FINDINGS: CT HEAD FINDINGS Brain: Mild age-related atrophy and chronic microvascular ischemic changes. There is no acute intracranial hemorrhage. No mass effect or midline shift. No extra-axial fluid collection. Vascular: No hyperdense vessel or unexpected calcification. Skull: Normal. Negative for fracture or focal lesion. Sinuses/Orbits: No acute finding. Other: None. CT CERVICAL SPINE FINDINGS Alignment: No acute subluxation. Skull base and vertebrae: No acute fracture.  Osteopenia. Soft tissues and spinal canal: No prevertebral fluid or swelling. No visible canal hematoma. Disc levels: Multilevel degenerative changes and disc disease.  Multilevel endplate irregularity and disc space narrowing. Upper chest: Atherosclerotic calcification of the aortic arch. Other: Bilateral carotid bulb calcified plaques. IMPRESSION: 1. No acute intracranial hemorrhage. 2. Mild age-related atrophy and chronic microvascular ischemic changes. 3. No acute/traumatic cervical spine pathology. Multilevel degenerative changes. Electronically Signed   By: Anner Crete M.D.   On: 05/09/2017 22:04     EKG: Independently reviewed.  Sinus rhythm, QTC 467, LAD, nonspecific T-wave change   Assessment/Plan Principal Problem:   Slurred speech Active Problems:   Hypertension   Hyperlipidemia, mixed   GERD (gastroesophageal reflux disease)   Alcoholism ([place])   Depression with anxiety   Fall   Acute renal failure superimposed on stage 2 chronic kidney disease ([place])  Slurred speech: pt was reportedly to have had transient slurred speech and confusion in the event of fall. Unclear etiology. Will need to r/o small stroke.  -Place on telemetry bed for observation -MRI of her brain -Continue aspirin, Crestor -Frequent neuro check  Fall: CT head and CT of C-spine negative. -PT/OT -check orthostatic vital sign -wound care consult for skin tear in left elbow  HTN:  -switch ZiAC to bisoprolol only since patient will be given IV fluid -Hold HCTZ -IV hydralazine prn  HLD -crestor  GERD: -Protonix  Alcoholism ([place]): -in remission  Depression and anxiety: Stable, no suicidal or homicidal ideations. -Continue home medications: When necessary Xanax, Wellbutrin, Lamictal, sertraline  Acute renal failure superimposed on stage 2 chronic kidney disease ([place]): Baseline creatinine 1.0, her creatinine is 1.20, BUN 25. Likely due to renal failure secondary to dehydration. -hold diuretics  -IV fluids: Normal saline 100 mL per hour    DVT ppx: SQ Lovenox Code Status: Full code Family Communication: None at bed side.      Disposition Plan:  Anticipate  discharge back to previous home environment Consults called:  none Admission status: Obs / tele   Date of Service 05/10/2017    Ivor Costa [facility] Pager [phone]  If 7PM-7AM, please contact night-coverage [email or web address] Password [facility] 05/10/2017, 2:46 AM

## 2017-05-09 NOTE — ED Notes (Signed)
Patient transported to X-ray 

## 2017-05-09 NOTE — ED Notes (Signed)
ED Provider at bedside. 

## 2017-05-09 NOTE — ED Triage Notes (Signed)
Pt BIB [facility] for an unwitnessed fall. Pt was found in the laundry room floor. She denies LOC. Family stated speech was abnormal. Pt is alert and following commands in triage. c-collar in place from EMS. Pt has a skin tear to the left elbow

## 2017-05-09 NOTE — ED Provider Notes (Signed)
Emergency Department Provider Note   I have reviewed the triage vital signs and the nursing notes.   HISTORY  Chief Complaint Fall   HPI Angelica CANTRELLE is a 71 y.o. female with PMH of GERD, HLD, HTN, and EtOH use presents to the emergency department by EMS after patient was found on the laundry room floor.  Patient states that she has been feeling lightheaded and fell.  EMS was called which time family reported that the patient was having abnormal speech for her.  EMS states that she had difficulty walking and some confusion during the neurological exam but they could not find a focal neuro deficit.  The patient's last seen normal was 2 PM today and so code stroke was not activated by EMS.  Patient denies any chest pain, palpitations, dyspnea prior to falling.    Past Medical History:  Diagnosis Date  . Alcoholism ([place])   . Anxiety   . Depression   . Elevated hemoglobin A1c   . Fracture of right wrist   . GERD (gastroesophageal reflux disease)   . Heart murmur   . Hyperlipidemia   . Hypertension   . IBS (irritable bowel syndrome)     Patient Active Problem List   Diagnosis Date Noted  . Depression with anxiety 05/09/2017  . Fall 05/09/2017  . Slurred speech 05/09/2017  . Acute renal failure superimposed on stage 2 chronic kidney disease ([place]) 05/09/2017  . Chronic kidney disease, stage 3 ([place]) 04/23/2017  . OAB (overactive bladder) 06/09/2014  . Vitamin D deficiency 10/27/2013  . Medication management 10/27/2013  . Hypertension   . Hyperlipidemia, mixed   . GERD (gastroesophageal reflux disease)   . Bipolar depression ([place])   . Alcoholism ([place])   . IBS (irritable bowel syndrome)   . Other abnormal glucose     Past Surgical History:  Procedure Laterality Date  . ABDOMINAL HYSTERECTOMY  1991  . APPENDECTOMY    . BREAST SURGERY Left 1989   [facility]  . CARPAL TUNNEL RELEASE Right 10/18/2014   Procedure: CARPAL TUNNEL RELEASE;  Surgeon: Dorna Leitz, MD;  Location: [facility];  Service: Orthopedics;  Laterality: Right;  . [place]  . NECK SURGERY  Remote    Ruptured disc, per patient. Got infected.   . ORIF WRIST FRACTURE Right 10/18/2014   Procedure: OPEN REDUCTION INTERNAL FIXATION (ORIF) RIGHT WRIST FRACTURE;  Surgeon: Dorna Leitz, MD;  Location: [facility];  Service: Orthopedics;  Laterality: Right;  . TONSILLECTOMY  1965  . TUBAL LIGATION        Allergies Lipitor [atorvastatin] and Prednisone  Family History  Problem Relation Age of Onset  . Heart disease Mother   . Diabetes Mother   . Leukemia Father   . Heart disease Brother   . Cancer Brother   . Parkinson's disease Brother     Social History Social History   Tobacco Use  . Smoking status: Never Smoker  . Smokeless tobacco: Never Used  Substance Use Topics  . Alcohol use: No  . Drug use: No    Review of Systems  Constitutional: No fever/chills Eyes: No visual changes. ENT: No sore throat. Cardiovascular: Denies chest pain. Respiratory: Denies shortness of breath. Gastrointestinal: No abdominal pain.  No nausea, no vomiting.  No diarrhea.  No constipation. Genitourinary: Negative for dysuria. Musculoskeletal: Negative for back pain. Positive left elbow pain.  Skin: Negative for rash. Skin tare to the left elbow.  Neurological: Negative for focal weakness or numbness.  Positive HA.   10-point ROS otherwise negative.  ____________________________________________   PHYSICAL EXAM:  VITAL SIGNS: ED Triage Vitals  Enc Vitals Group     BP 05/09/17 2110 128/66     Pulse Rate 05/09/17 2110 62     Resp 05/09/17 2110 18     Temp 05/09/17 2110 97.9 F (36.6 C)     Temp Source 05/09/17 2110 Oral     SpO2 05/09/17 2106 95 %     Weight 05/09/17 2109 155 lb (70.3 kg)     Height 05/09/17 2109 5\' 6"  (1.676 m)    Constitutional: Alert and oriented. Well appearing and in no acute distress. Eyes: Conjunctivae are normal. PERRL. EOMI. Head: Hematoma to the forehead.    Nose: No congestion/rhinnorhea. Mouth/Throat: Mucous membranes are moist.  Neck: No stridor.  Cardiovascular: Normal rate, regular rhythm. Good peripheral circulation. Grossly normal heart sounds.   Respiratory: Normal respiratory effort.  No retractions. Lungs CTAB. Gastrointestinal: Soft and nontender. No distention.  Musculoskeletal: No lower extremity tenderness nor edema. No gross deformities of extremities. Neurologic:  Normal speech and language. No gross focal neurologic deficits are appreciated. Normal CN exam 2-12. No pronator drift. Normal strength and sensation in the upper and lower extremities bilaterally.  Skin:  Skin is warm and dry. Small skin tare to the left lateral elbow.   ____________________________________________   LABS (all labs ordered are listed, but only abnormal results are displayed)  Labs Reviewed  CBC - Abnormal; Notable for the following components:      Result Value   RBC 3.85 (*)    Hemoglobin 11.5 (*)    All other components within normal limits  COMPREHENSIVE METABOLIC PANEL - Abnormal; Notable for the following components:   Glucose, Bld 103 (*)    Creatinine, Ser 1.22 (*)    Total Protein 6.4 (*)    AST 48 (*)    Total Bilirubin 1.3 (*)    GFR calc non Af Amer 43 (*)    GFR calc Af Amer 50 (*)    All other components within normal limits  RAPID URINE DRUG SCREEN, HOSP PERFORMED - Abnormal; Notable for the following components:   Benzodiazepines POSITIVE (*)    All other components within normal limits  I-STAT CHEM 8, ED - Abnormal; Notable for the following components:   Chloride 100 (*)    BUN 25 (*)    Creatinine, Ser 1.20 (*)    Glucose, Bld 105 (*)    Calcium, Ion 1.11 (*)    Hemoglobin 11.6 (*)    HCT 34.0 (*)    All other components within normal limits  I-STAT CG4 LACTIC ACID, ED - Abnormal; Notable for the following components:   Lactic Acid, Venous 0.47 (*)    All other components within normal limits  ETHANOL   PROTIME-INR  APTT  DIFFERENTIAL  URINALYSIS, ROUTINE W REFLEX MICROSCOPIC  HEMOGLOBIN A1C  LIPID PANEL  RPR  FOLATE  VITAMIN B12  TSH  SEDIMENTATION RATE  I-STAT TROPONIN, ED  CBG MONITORING, ED   ____________________________________________  EKG   EKG Interpretation  Date/Time:  Thursday May 09 2017 21:11:17 EST Ventricular Rate:  63 PR Interval:    QRS Duration: 115 QT Interval:  456 QTC Calculation: 467 R Axis:   -14 Text Interpretation:  Sinus rhythm Borderline prolonged PR interval Probable left ventricular hypertrophy No STEMI.  Confirmed by Nanda Quinton [phone]) on 05/09/2017 9:52:44 PM Also confirmed by Nanda Quinton [phone]), editor Laurena Spies [phone])  on  05/10/2017 7:03:17 AM       ____________________________________________  RADIOLOGY  Dg Elbow 2 Views Left  Result Date: 05/09/2017 CLINICAL DATA:  Left elbow pain EXAM: LEFT ELBOW - 2 VIEW COMPARISON:  None. FINDINGS: No fracture or dislocation. No large elbow effusion. Soft tissues are unremarkable IMPRESSION: No acute osseous abnormality Electronically Signed   By: Donavan Foil M.D.   On: 05/09/2017 22:41   Ct Head Wo Contrast  Result Date: 05/09/2017 CLINICAL DATA:  71 year old female with unwitnessed fall. EXAM: CT HEAD WITHOUT CONTRAST CT CERVICAL SPINE WITHOUT CONTRAST TECHNIQUE: Multidetector CT imaging of the head and cervical spine was performed following the standard protocol without intravenous contrast. Multiplanar CT image reconstructions of the cervical spine were also generated. COMPARISON:  Head CT dated 11/05/2015 FINDINGS: CT HEAD FINDINGS Brain: Mild age-related atrophy and chronic microvascular ischemic changes. There is no acute intracranial hemorrhage. No mass effect or midline shift. No extra-axial fluid collection. Vascular: No hyperdense vessel or unexpected calcification. Skull: Normal. Negative for fracture or focal lesion. Sinuses/Orbits: No acute finding. Other: None. CT  CERVICAL SPINE FINDINGS Alignment: No acute subluxation. Skull base and vertebrae: No acute fracture.  Osteopenia. Soft tissues and spinal canal: No prevertebral fluid or swelling. No visible canal hematoma. Disc levels: Multilevel degenerative changes and disc disease. Multilevel endplate irregularity and disc space narrowing. Upper chest: Atherosclerotic calcification of the aortic arch. Other: Bilateral carotid bulb calcified plaques. IMPRESSION: 1. No acute intracranial hemorrhage. 2. Mild age-related atrophy and chronic microvascular ischemic changes. 3. No acute/traumatic cervical spine pathology. Multilevel degenerative changes. Electronically Signed   By: Anner Crete M.D.   On: 05/09/2017 22:04   Ct Cervical Spine Wo Contrast  Result Date: 05/09/2017 CLINICAL DATA:  71 year old female with unwitnessed fall. EXAM: CT HEAD WITHOUT CONTRAST CT CERVICAL SPINE WITHOUT CONTRAST TECHNIQUE: Multidetector CT imaging of the head and cervical spine was performed following the standard protocol without intravenous contrast. Multiplanar CT image reconstructions of the cervical spine were also generated. COMPARISON:  Head CT dated 11/05/2015 FINDINGS: CT HEAD FINDINGS Brain: Mild age-related atrophy and chronic microvascular ischemic changes. There is no acute intracranial hemorrhage. No mass effect or midline shift. No extra-axial fluid collection. Vascular: No hyperdense vessel or unexpected calcification. Skull: Normal. Negative for fracture or focal lesion. Sinuses/Orbits: No acute finding. Other: None. CT CERVICAL SPINE FINDINGS Alignment: No acute subluxation. Skull base and vertebrae: No acute fracture.  Osteopenia. Soft tissues and spinal canal: No prevertebral fluid or swelling. No visible canal hematoma. Disc levels: Multilevel degenerative changes and disc disease. Multilevel endplate irregularity and disc space narrowing. Upper chest: Atherosclerotic calcification of the aortic arch. Other: Bilateral  carotid bulb calcified plaques. IMPRESSION: 1. No acute intracranial hemorrhage. 2. Mild age-related atrophy and chronic microvascular ischemic changes. 3. No acute/traumatic cervical spine pathology. Multilevel degenerative changes. Electronically Signed   By: Anner Crete M.D.   On: 05/09/2017 22:04   Mr Brain Wo Contrast  Result Date: 05/10/2017 CLINICAL DATA:  [name] on 05/09/2017.  Slurred speech. EXAM: MRI HEAD WITHOUT CONTRAST TECHNIQUE: Multiplanar, multiecho pulse sequences of the brain and surrounding structures were obtained without intravenous contrast. COMPARISON:  CT 05/09/2017. FINDINGS: Brain: Diffusion imaging does not show any acute or subacute infarction. There is mild age related volume loss without evidence of any old or acute small or large vessel ischemic changes. No mass lesion, hemorrhage, hydrocephalus or extra-axial collection. No pituitary mass. Vascular: Major vessels at the base of the brain show flow. Skull and upper cervical spine: Negative  Sinuses/Orbits: Clear/normal Other: None IMPRESSION: Mild age related volume loss. No acute or focal finding. No traumatic finding. Electronically Signed   By: Nelson Chimes M.D.   On: 05/10/2017 08:34    ____________________________________________   PROCEDURES  Procedure(s) performed:   Procedures  None ____________________________________________   INITIAL IMPRESSION / ASSESSMENT AND PLAN / ED COURSE  Pertinent labs & imaging results that were available during my care of the patient were reviewed by me and considered in my medical decision making (see chart for details).  Patient presents to the emergency department for evaluation after unwitnessed fall.  Family reported some speech disturbance at home but on arrival the patient is speaking clearly on my exam.  She has no focal neurological deficits.  Unclear if the fall was syncope related versus mechanical versus TIA.  Patient does have a skin tear to the left elbow.   Plan for CT imaging of the head along with plain film of the left elbow. Outside of code stroke/TPA window and LVO negative on arrival to the ED.   10:26 PM  his family is now at bedside.  They report that patient has had intermittent confusion since Thanksgiving.  The patient went to a neighbor's house where the fall occurred.  She states that the neighbors felt like her speech was different and possibly slurred but when she was called and spoke to her over the phone she felt like it was normal.  Family states that she is taking a lot of medications for depression and they are concerned that these may be interacting and causing the confusion. No concern from family in terms of self-harm.   11:19 PM Patient with largely unremarkable labs. Plan for admission for TIA evaluation with gait disturbance and slurred speech earlier.   Discussed patient's case with Hospitalist, Dr. Blaine Hamper to request admission. Patient and family (if present) updated with plan. Care transferred to Hospitalist service.  I reviewed all nursing notes, vitals, pertinent old records, EKGs, labs, imaging (as available).  ____________________________________________  FINAL CLINICAL IMPRESSION(S) / ED DIAGNOSES  Final diagnoses:  Slurred speech  Fall, initial encounter  Injury of head, initial encounter     MEDICATIONS GIVEN DURING THIS VISIT:  Medications  pantoprazole (PROTONIX) EC tablet 40 mg (0 mg Oral Hold 05/10/17 1207)  ALPRAZolam (XANAX) tablet 1 mg (1 mg Oral Given 05/10/17 0434)  aspirin EC tablet 81 mg (81 mg Oral Given 05/10/17 1244)  buPROPion (WELLBUTRIN XL) 24 hr tablet 450 mg (0 mg Oral Hold 05/10/17 1207)  cholecalciferol (VITAMIN D) tablet 5,000 Units (5,000 Units Oral Given 05/10/17 1243)  gabapentin (NEURONTIN) capsule 300 mg (300 mg Oral Given 05/10/17 0224)  lamoTRIgine (LAMICTAL) tablet 400 mg (400 mg Oral Given 05/10/17 1243)  rosuvastatin (CRESTOR) tablet 20 mg (0 mg Oral Hold 05/10/17 1207)    sertraline (ZOLOFT) tablet 200 mg (200 mg Oral Given 05/10/17 1243)  bisoprolol (ZEBETA) tablet 5 mg (5 mg Oral Given 05/10/17 1244)  0.9 %  sodium chloride infusion ( Intravenous Restarted 05/10/17 1000)  acetaminophen (TYLENOL) tablet 650 mg (not administered)    Or  acetaminophen (TYLENOL) solution 650 mg (not administered)    Or  acetaminophen (TYLENOL) suppository 650 mg (not administered)  senna-docusate (Senokot-S) tablet 1 tablet (not administered)  enoxaparin (LOVENOX) injection 40 mg (40 mg Subcutaneous Given 05/10/17 1242)  hydrALAZINE (APRESOLINE) injection 5 mg (not administered)  ondansetron (ZOFRAN) injection 4 mg (not administered)  zolpidem (AMBIEN) tablet 5 mg (not administered)   stroke: mapping our early  stages of recovery book ( Does not apply Given 05/10/17 0225)    Note:  This document was prepared using Dragon voice recognition software and may include unintentional dictation errors.  Nanda Quinton, MD Emergency Medicine   Leigh Kaeding, [facility], MD 05/10/17 [id number]

## 2017-05-09 NOTE — Progress Notes (Deleted)
FOLLOW UP  Assessment and Plan:   Diagnoses and all orders for this visit:  Essential hypertension -     Magnesium  Chronic kidney disease, stage 3 (HCC) -     BASIC METABOLIC PANEL WITH GFR  Hyperlipidemia, mixed -     Lipid panel -     TSH  Bipolar depression ([facility])  Alcoholism ([facility]) -     Vitamin B12  Other abnormal glucose -     BASIC METABOLIC PANEL WITH GFR  Vitamin D deficiency  Medication management -     CBC with Differential/Platelet -     BASIC METABOLIC PANEL WITH GFR -     Hepatic function panel  Confusion and disorientation -     CBC with Differential/Platelet -     BASIC METABOLIC PANEL WITH GFR -     Hepatic function panel -     Vitamin B12 -     Urinalysis w microscopic + reflex cultur -     DG Chest 2 View; Future    Continue diet and meds as discussed. Further disposition pending results of labs. Discussed med's effects and SE's.   Over 30 minutes of exam, counseling, chart review, and critical decision making was performed.   Future Appointments  Date Time Provider [place]  05/10/2017 11:00 AM Liane Comber, NP [facility] None  08/07/2017  3:45 PM Unk Pinto, MD [facility] None    ----------------------------------------------------------------------------------------------------------------------  HPI 71 y.o. Caucasian female presents for 3 month follow up on hypertension, cholesterol, glucose monitoring, stage 3 CKD and vitamin D deficiency. She has a hx of alcoholism and bipolar depression for which she is followed by Dr. Chucky May - currently treated by zoloft 100 mg, wellbutrin XR 150 mg, lamotrigine 200 mg daily - she is also prescribed xanax 1 mg TID PRN and gabapentin 600 mg QID*** with known hx of excessive use as well as falls.   The niece has called to notify office of concerns of patient hallucinating in the evenings since 04/05/2017-   BMI is There is no height or weight on file to calculate BMI., she {HAS  HAS OFB:51025} been working on diet and exercise. Wt Readings from Last 3 Encounters:  01/15/17 159 lb (72.1 kg)  08/08/16 158 lb (71.7 kg)  05/10/16 158 lb 3.2 oz (71.8 kg)   Her blood pressure {HAS HAS NOT:18834} been controlled at home, today their BP is    She {DOES_DOES ENI:77824} workout. She denies chest pain, shortness of breath, dizziness.   She is on cholesterol medication (crestor 40 mg daily) and denies myalgias. Her cholesterol is at goal. The cholesterol last visit was:   Lab Results  Component Value Date   CHOL 176 01/15/2017   HDL 63 01/15/2017   LDLCALC 97 08/08/2016   TRIG 124 01/15/2017   CHOLHDL 2.8 01/15/2017    She {Has/has not:18111} been working on diet and exercise for glucose management, and denies {Symptoms; diabetes w/o none:19199}. Last A1C in the office was:  Lab Results  Component Value Date   HGBA1C 5.6 01/15/2017   Patient is on Vitamin D supplement and at goal:    Lab Results  Component Value Date   VD25OH 95 01/15/2017        Current Medications:  Current Outpatient Medications on File Prior to Visit  Medication Sig  . ALPRAZolam (XANAX) 1 MG tablet Take 1 mg by mouth 3 (three) times daily as needed. For anxiety.  Marland Kitchen aspirin EC 81 MG tablet Take 81  mg by mouth daily.  . bisoprolol-hydrochlorothiazide (ZIAC) 5-6.25 MG tablet take 1 tablet by mouth once daily  . buPROPion (WELLBUTRIN XL) 150 MG 24 hr tablet Take 450 mg by mouth daily.  . Cholecalciferol (VITAMIN D3) 5000 UNITS CAPS Take 5,000 Units by mouth daily.   Marland Kitchen esomeprazole (NEXIUM) 40 MG capsule take 1 capsule by mouth once daily AT NOON  . gabapentin (NEURONTIN) 600 MG tablet Take 600 mg by mouth 4 (four) times daily.  . hydrochlorothiazide (MICROZIDE) 12.5 MG capsule take 1 capsule by mouth once daily for blood pressure and FLUID  . lamoTRIgine (LAMICTAL) 200 MG tablet   . rosuvastatin (CRESTOR) 40 MG tablet take 1/2 to 1 tablet by mouth once daily  . sertraline (ZOLOFT) 100 MG  tablet    No current facility-administered medications on file prior to visit.      Allergies:  Allergies  Allergen Reactions  . Lipitor [Atorvastatin]     Fatigue  . Prednisone Other (See Comments)    Change in mental status     Medical History:  Past Medical History:  Diagnosis Date  . Alcoholism ([place])   . Anxiety   . Depression   . Elevated hemoglobin A1c   . Fracture of right wrist   . GERD (gastroesophageal reflux disease)   . Heart murmur   . Hyperlipidemia   . Hypertension   . IBS (irritable bowel syndrome)    Family history- Reviewed and unchanged Social history- Reviewed and unchanged   Review of Systems:  ROS    Physical Exam: There were no vitals taken for this visit. Wt Readings from Last 3 Encounters:  01/15/17 159 lb (72.1 kg)  08/08/16 158 lb (71.7 kg)  05/10/16 158 lb 3.2 oz (71.8 kg)   General Appearance: Well nourished, in no apparent distress. Eyes: PERRLA, EOMs, conjunctiva no swelling or erythema Sinuses: No Frontal/maxillary tenderness ENT/Mouth: Ext aud canals clear, TMs without erythema, bulging. No erythema, swelling, or exudate on post pharynx.  Tonsils not swollen or erythematous. Hearing normal.  Neck: Supple, thyroid normal.  Respiratory: Respiratory effort normal, BS equal bilaterally without rales, rhonchi, wheezing or stridor.  Cardio: RRR with no MRGs. Brisk peripheral pulses without edema.  Abdomen: Soft, + BS.  Non tender, no guarding, rebound, hernias, masses. Lymphatics: Non tender without lymphadenopathy.  Musculoskeletal: Full ROM, 5/5 strength, {PSY - GAIT AND STATION:22860} gait Skin: Warm, dry without rashes, lesions, ecchymosis.  Neuro: Cranial nerves intact. No cerebellar symptoms.  Psych: Awake and oriented X 3, normal affect, Insight and Judgment appropriate.    Izora Ribas, NP 6:56 PM [facility]

## 2017-05-10 ENCOUNTER — Observation Stay (HOSPITAL_COMMUNITY): Payer: Medicare Other

## 2017-05-10 ENCOUNTER — Ambulatory Visit: Payer: Self-pay | Admitting: Adult Health

## 2017-05-10 ENCOUNTER — Observation Stay (HOSPITAL_BASED_OUTPATIENT_CLINIC_OR_DEPARTMENT_OTHER): Payer: Medicare Other

## 2017-05-10 DIAGNOSIS — S0990XA Unspecified injury of head, initial encounter: Secondary | ICD-10-CM

## 2017-05-10 DIAGNOSIS — F418 Other specified anxiety disorders: Secondary | ICD-10-CM | POA: Diagnosis not present

## 2017-05-10 DIAGNOSIS — R4781 Slurred speech: Secondary | ICD-10-CM | POA: Diagnosis not present

## 2017-05-10 DIAGNOSIS — W19XXXA Unspecified fall, initial encounter: Secondary | ICD-10-CM | POA: Diagnosis not present

## 2017-05-10 DIAGNOSIS — N182 Chronic kidney disease, stage 2 (mild): Secondary | ICD-10-CM | POA: Diagnosis not present

## 2017-05-10 DIAGNOSIS — N179 Acute kidney failure, unspecified: Secondary | ICD-10-CM | POA: Diagnosis not present

## 2017-05-10 DIAGNOSIS — F1020 Alcohol dependence, uncomplicated: Secondary | ICD-10-CM | POA: Diagnosis not present

## 2017-05-10 DIAGNOSIS — I340 Nonrheumatic mitral (valve) insufficiency: Secondary | ICD-10-CM | POA: Diagnosis not present

## 2017-05-10 DIAGNOSIS — K219 Gastro-esophageal reflux disease without esophagitis: Secondary | ICD-10-CM | POA: Diagnosis not present

## 2017-05-10 DIAGNOSIS — I10 Essential (primary) hypertension: Secondary | ICD-10-CM

## 2017-05-10 DIAGNOSIS — E782 Mixed hyperlipidemia: Secondary | ICD-10-CM | POA: Diagnosis not present

## 2017-05-10 LAB — LIPID PANEL
CHOLESTEROL: 125 mg/dL (ref 0–200)
HDL: 50 mg/dL (ref >40)
LDL CALC: 50 mg/dL (ref 0–99)
Total CHOL/HDL Ratio: 2.5 RATIO
Triglycerides: 124 mg/dL (ref <150)
VLDL: 25 mg/dL (ref 0–40)

## 2017-05-10 LAB — ECHOCARDIOGRAM COMPLETE
HEIGHTINCHES: 66 in
Weight: 2480 oz

## 2017-05-10 LAB — VITAMIN B12: Vitamin B-12: 414 pg/mL (ref 180–914)

## 2017-05-10 LAB — TSH: TSH: 1.469 u[IU]/mL (ref 0.350–4.500)

## 2017-05-10 LAB — SEDIMENTATION RATE: Sed Rate: 18 mm/hr (ref 0–22)

## 2017-05-10 LAB — HEMOGLOBIN A1C
Hgb A1c MFr Bld: 5.4 % (ref 4.8–5.6)
Mean Plasma Glucose: 108.28 mg/dL

## 2017-05-10 LAB — FOLATE: Folate: 9.1 ng/mL (ref >5.9)

## 2017-05-10 MED ORDER — SODIUM CHLORIDE 0.9 % IV SOLN
INTRAVENOUS | Status: DC
  Administered 2017-05-10 (×2): via INTRAVENOUS

## 2017-05-10 MED ORDER — GABAPENTIN 300 MG PO CAPS
300.0000 mg | ORAL_CAPSULE | Freq: Every day | ORAL | Status: DC
  Administered 2017-05-10 (×2): 300 mg via ORAL
  Filled 2017-05-10 (×2): qty 1

## 2017-05-10 MED ORDER — BISOPROLOL FUMARATE 5 MG PO TABS
5.0000 mg | ORAL_TABLET | Freq: Every day | ORAL | Status: DC
  Administered 2017-05-10 – 2017-05-11 (×2): 5 mg via ORAL
  Filled 2017-05-10 (×2): qty 1

## 2017-05-10 MED ORDER — ACETAMINOPHEN 160 MG/5ML PO SOLN: 650.0000 mg | ORAL | Status: DC | PRN

## 2017-05-10 MED ORDER — ROSUVASTATIN CALCIUM 20 MG PO TABS
20.0000 mg | ORAL_TABLET | Freq: Every day | ORAL | Status: DC
  Administered 2017-05-11: 20 mg via ORAL
  Filled 2017-05-10 (×2): qty 1

## 2017-05-10 MED ORDER — SERTRALINE HCL 100 MG PO TABS
200.0000 mg | ORAL_TABLET | Freq: Every day | ORAL | Status: DC
  Administered 2017-05-10 – 2017-05-11 (×2): 200 mg via ORAL
  Filled 2017-05-10 (×2): qty 2

## 2017-05-10 MED ORDER — ZOLPIDEM TARTRATE 5 MG PO TABS: 5.0000 mg | ORAL_TABLET | Freq: Every evening | ORAL | Status: DC | PRN

## 2017-05-10 MED ORDER — ACETAMINOPHEN 650 MG RE SUPP: 650.0000 mg | RECTAL | Status: DC | PRN

## 2017-05-10 MED ORDER — HYDRALAZINE HCL 20 MG/ML IJ SOLN: 5.0000 mg | INTRAMUSCULAR | Status: DC | PRN

## 2017-05-10 MED ORDER — ALPRAZOLAM 0.5 MG PO TABS
1.0000 mg | ORAL_TABLET | Freq: Three times a day (TID) | ORAL | Status: DC | PRN
  Administered 2017-05-10: 1 mg via ORAL
  Filled 2017-05-10: qty 2

## 2017-05-10 MED ORDER — HYDROCHLOROTHIAZIDE 12.5 MG PO CAPS
12.5000 mg | ORAL_CAPSULE | Freq: Every day | ORAL | Status: DC
Start: 2017-05-10 — End: 2017-05-10

## 2017-05-10 MED ORDER — BUPROPION HCL ER (XL) 150 MG PO TB24
450.0000 mg | ORAL_TABLET | Freq: Every day | ORAL | Status: DC
  Administered 2017-05-11: 450 mg via ORAL
  Filled 2017-05-10 (×2): qty 3

## 2017-05-10 MED ORDER — LAMOTRIGINE 100 MG PO TABS
400.0000 mg | ORAL_TABLET | Freq: Every day | ORAL | Status: DC
  Administered 2017-05-10 – 2017-05-11 (×2): 400 mg via ORAL
  Filled 2017-05-10 (×2): qty 4

## 2017-05-10 MED ORDER — ONDANSETRON HCL 4 MG/2ML IJ SOLN: 4.0000 mg | Freq: Three times a day (TID) | INTRAMUSCULAR | Status: DC | PRN

## 2017-05-10 MED ORDER — ASPIRIN EC 81 MG PO TBEC
81.0000 mg | DELAYED_RELEASE_TABLET | Freq: Every day | ORAL | Status: DC
  Administered 2017-05-10: 81 mg via ORAL
  Filled 2017-05-10: qty 1

## 2017-05-10 MED ORDER — ENOXAPARIN SODIUM 40 MG/0.4ML ~~LOC~~ SOLN
40.0000 mg | SUBCUTANEOUS | Status: DC
Start: 2017-05-10 — End: 2017-05-11
  Administered 2017-05-10 – 2017-05-11 (×2): 40 mg via SUBCUTANEOUS
  Filled 2017-05-10 (×2): qty 0.4

## 2017-05-10 MED ORDER — SODIUM CHLORIDE 0.9 % IV SOLN: INTRAVENOUS | Status: DC

## 2017-05-10 MED ORDER — STROKE: EARLY STAGES OF RECOVERY BOOK
Freq: Once | Status: AC
  Administered 2017-05-10: 02:00:00
  Filled 2017-05-10: qty 1

## 2017-05-10 MED ORDER — BISOPROLOL-HYDROCHLOROTHIAZIDE 5-6.25 MG PO TABS: 1.0000 | ORAL_TABLET | Freq: Every day | ORAL | Status: DC

## 2017-05-10 MED ORDER — PANTOPRAZOLE SODIUM 40 MG PO TBEC
40.0000 mg | DELAYED_RELEASE_TABLET | Freq: Every day | ORAL | Status: DC
  Administered 2017-05-11: 40 mg via ORAL
  Filled 2017-05-10 (×2): qty 1

## 2017-05-10 MED ORDER — VITAMIN D 1000 UNITS PO TABS
5000.0000 [IU] | ORAL_TABLET | Freq: Every day | ORAL | Status: DC
  Administered 2017-05-10 – 2017-05-11 (×2): 5000 [IU] via ORAL
  Filled 2017-05-10 (×2): qty 5

## 2017-05-10 MED ORDER — SENNOSIDES-DOCUSATE SODIUM 8.6-50 MG PO TABS: 1.0000 | ORAL_TABLET | Freq: Every evening | ORAL | Status: DC | PRN

## 2017-05-10 MED ORDER — ACETAMINOPHEN 325 MG PO TABS: 650.0000 mg | ORAL_TABLET | ORAL | Status: DC | PRN

## 2017-05-10 MED ORDER — ASPIRIN EC 325 MG PO TBEC
325.0000 mg | DELAYED_RELEASE_TABLET | Freq: Every day | ORAL | Status: DC
Start: 2017-05-11 — End: 2017-05-11
  Administered 2017-05-11: 325 mg via ORAL
  Filled 2017-05-10: qty 1

## 2017-05-10 NOTE — Progress Notes (Signed)
EEG completed, results pending. 

## 2017-05-10 NOTE — Consult Note (Addendum)
Requesting Physician: Dr. Barbaraann Rondo    Chief Complaint: TIA  History obtained from:  Patient   and daughter  HPI:                                                                                                                                         Angelica Kelley is an 71 y.o. female with history of hypertension, hyperlipidemia, depression, anxiety, previous alcoholism.  Patient presents the hospital after she was noted to have slurred speech x2 yesterday along with a fall.  Neurology was consulted for possible TIA.  Thus far workup has shown a hemoglobin A1c of 5.4, LDL of 50.  MRI brain showed mild age-related volume loss but no acute intracranial findings.  Patient is on aspirin and statin twitched medications are given to her by her daughter.  It is noted that patient is on significant amount of sedating medications including Xanax, Wellbutrin, Lamictal, sertraline.  In discussion with the daughter it has been noted that over the past months if not longer patient has been having difficulty with getting lost and remembering things.  In addition she is on Wellbutrin which can lower the seizure threshold.  Currently patient is back to her baseline per daughter.   Date last known well: Date: 04/29/2017 Time last known well: Unable to determine tPA Given: No: Symptoms resolved NIH stroke scale of 0 Modified Rankin: Rankin Score=1    Past Medical History:  Diagnosis Date  . Alcoholism ([place])   . Anxiety   . Depression   . Elevated hemoglobin A1c   . Fracture of right wrist   . GERD (gastroesophageal reflux disease)   . Heart murmur   . Hyperlipidemia   . Hypertension   . IBS (irritable bowel syndrome)     Past Surgical History:  Procedure Laterality Date  . ABDOMINAL HYSTERECTOMY  1991  . APPENDECTOMY    . BREAST SURGERY Left 1989   [facility]  . CARPAL TUNNEL RELEASE Right 10/18/2014   Procedure: CARPAL TUNNEL RELEASE;  Surgeon: Dorna Leitz, MD;  Location: [facility];  Service: Orthopedics;   Laterality: Right;  . [place]  . NECK SURGERY  Remote    Ruptured disc, per patient. Got infected.   . ORIF WRIST FRACTURE Right 10/18/2014   Procedure: OPEN REDUCTION INTERNAL FIXATION (ORIF) RIGHT WRIST FRACTURE;  Surgeon: Dorna Leitz, MD;  Location: [facility];  Service: Orthopedics;  Laterality: Right;  . TONSILLECTOMY  1965  . TUBAL LIGATION      Family History  Problem Relation Age of Onset  . Heart disease Mother   . Diabetes Mother   . Leukemia Father   . Heart disease Brother   . Cancer Brother   . Parkinson's disease Brother    Social History:  reports that  has never smoked. she has never used smokeless tobacco. She reports that she does not drink alcohol or  use drugs.  Allergies:  Allergies  Allergen Reactions  . Lipitor [Atorvastatin]     Fatigue  . Prednisone Other (See Comments)    Change in mental status    Medications:                                                                                                                           Scheduled: . aspirin EC  81 mg Oral Daily  . bisoprolol  5 mg Oral Daily  . buPROPion  450 mg Oral Daily  . cholecalciferol  5,000 Units Oral Daily  . enoxaparin (LOVENOX) injection  40 mg Subcutaneous Q24H  . gabapentin  300 mg Oral QHS  . lamoTRIgine  400 mg Oral Daily  . pantoprazole  40 mg Oral Daily  . rosuvastatin  20 mg Oral Daily  . sertraline  200 mg Oral Daily   Continuous: . sodium chloride 100 mL/hr at 05/10/17 1000    ROS:                                                                                                                                       History obtained from Daughter  General ROS: negative for - chills, fatigue, fever, night sweats, weight gain or weight loss Psychological ROS: Positive for -  hallucinations, memory difficulties,  Ophthalmic ROS: negative for - blurry vision, double vision, eye pain or loss of vision ENT ROS: negative for - epistaxis, nasal discharge,  oral lesions, sore throat, tinnitus or vertigo Allergy and Immunology ROS: negative for - hives or itchy/watery eyes Hematological and Lymphatic ROS: negative for - bleeding problems, bruising or swollen lymph nodes Endocrine ROS: negative for - galactorrhea, hair pattern changes, polydipsia/polyuria or temperature intolerance Respiratory ROS: negative for - cough, hemoptysis, shortness of breath or wheezing Cardiovascular ROS: negative for - chest pain, dyspnea on exertion, edema or irregular heartbeat Gastrointestinal ROS: negative for - abdominal pain, diarrhea, hematemesis, nausea/vomiting or stool incontinence Genito-Urinary ROS: negative for - dysuria, hematuria, incontinence or urinary frequency/urgency Musculoskeletal ROS: negative for - joint swelling or muscular weakness Neurological ROS: as noted in HPI Dermatological ROS: negative for rash and skin lesion changes  Neurologic Examination:  Blood pressure (!) 98/52, pulse (!) 106, temperature 97.6 F (36.4 C), temperature source Oral, resp. rate 18, height 5\' 6"  (1.676 m), weight 70.3 kg (155 lb), SpO2 99 %.  HEENT-  Normocephalic, no lesions, without obvious abnormality.  Normal external eye and conjunctiva.  Normal TM's bilaterally.  Normal auditory canals and external ears. Normal external nose, mucus membranes and septum.  Normal pharynx. Cardiovascular- S1, S2 normal, pulses palpable throughout   Lungs- chest clear, no wheezing, rales, normal symmetric air entry, Heart exam - S1, S2 normal, no murmur, no gallop, rate regular Abdomen- normal findings: bowel sounds normal Extremities- no edema Lymph-no adenopathy palpable Musculoskeletal-no joint tenderness, deformity or swelling Skin-warm and dry, no hyperpigmentation, vitiligo, or suspicious lesions  Neurological Examination Mental Status: Alert, oriented, thought content  appropriate.  Speech fluent without evidence of aphasia.  Able to follow simple commands with mild difficulty in which at times the command needed to be repeated.. Cranial Nerves: II: Visual fields grossly normal,  III,IV, VI: ptosis not present, extra-ocular motions intact bilaterally, pupils equal, round, reactive to light and accommodation V,VII: smile symmetric, facial light touch sensation normal bilaterally VIII: hearing normal bilaterally IX,X: uvula rises symmetrically XI: bilateral shoulder shrug XII: midline tongue extension Motor: Right : Upper extremity   5/5    Left:     Upper extremity   5/5  Lower extremity   5/5     Lower extremity   5/5 Both postural and resting tremor was noted Sensory: Pinprick and light touch intact throughout, bilaterally Deep Tendon Reflexes: 2+ and symmetric throughout Plantars: Right: downgoing   Left: downgoing Cerebellar: normal finger-to-nose,  and normal heel-to-shin test Gait: Not tested   Lab Results: Basic Metabolic Panel: Recent Labs  Lab 05/09/17 Sep 04, 2114 05/09/17 2122  NA 139 140  K 4.8 4.6  CL 101 100*  CO2 28  --   GLUCOSE 103* 105*  BUN 19 25*  CREATININE 1.22* 1.20*  CALCIUM 9.3  --     Liver Function Tests: Recent Labs  Lab 05/09/17 2116  AST 48*  ALT 17  ALKPHOS 57  BILITOT 1.3*  PROT 6.4*  ALBUMIN 3.7   No results for input(s): LIPASE, AMYLASE in the last 168 hours. No results for input(s): AMMONIA in the last 168 hours.  CBC: Recent Labs  Lab 05/09/17 Sep 04, 2114 05/09/17 2122  WBC 6.3  --   NEUTROABS 4.4  --   HGB 11.5* 11.6*  HCT 36.2 34.0*  MCV 94.0  --   PLT 217  --     Cardiac Enzymes: No results for input(s): CKTOTAL, CKMB, CKMBINDEX, TROPONINI in the last 168 hours.  Lipid Panel: Recent Labs  Lab 05/10/17 0442  CHOL 125  TRIG 124  HDL 50  CHOLHDL 2.5  VLDL 25  LDLCALC 50    CBG: Recent Labs  Lab 05/09/17 2113  GLUCAP 73    Microbiology: Results for orders placed or performed  in visit on 06/09/14  Urine culture     Status: None   Collection Time: 06/09/14  3:31 PM  Result Value Ref Range Status   Colony Count NO GROWTH  Final   Organism ID, Bacteria NO GROWTH  Final    Coagulation Studies: Recent Labs    05/09/17 2114-09-04  LABPROT 12.6  INR 0.95    Imaging: Dg Elbow 2 Views Left  Result Date: 05/09/2017 CLINICAL DATA:  Left elbow pain EXAM: LEFT ELBOW - 2 VIEW COMPARISON:  None. FINDINGS: No fracture or dislocation. No large  elbow effusion. Soft tissues are unremarkable IMPRESSION: No acute osseous abnormality Electronically Signed   By: Donavan Foil M.D.   On: 05/09/2017 22:41   Ct Head Wo Contrast  Result Date: 05/09/2017 CLINICAL DATA:  71 year old female with unwitnessed fall. EXAM: CT HEAD WITHOUT CONTRAST CT CERVICAL SPINE WITHOUT CONTRAST TECHNIQUE: Multidetector CT imaging of the head and cervical spine was performed following the standard protocol without intravenous contrast. Multiplanar CT image reconstructions of the cervical spine were also generated. COMPARISON:  Head CT dated 11/05/2015 FINDINGS: CT HEAD FINDINGS Brain: Mild age-related atrophy and chronic microvascular ischemic changes. There is no acute intracranial hemorrhage. No mass effect or midline shift. No extra-axial fluid collection. Vascular: No hyperdense vessel or unexpected calcification. Skull: Normal. Negative for fracture or focal lesion. Sinuses/Orbits: No acute finding. Other: None. CT CERVICAL SPINE FINDINGS Alignment: No acute subluxation. Skull base and vertebrae: No acute fracture.  Osteopenia. Soft tissues and spinal canal: No prevertebral fluid or swelling. No visible canal hematoma. Disc levels: Multilevel degenerative changes and disc disease. Multilevel endplate irregularity and disc space narrowing. Upper chest: Atherosclerotic calcification of the aortic arch. Other: Bilateral carotid bulb calcified plaques. IMPRESSION: 1. No acute intracranial hemorrhage. 2. Mild  age-related atrophy and chronic microvascular ischemic changes. 3. No acute/traumatic cervical spine pathology. Multilevel degenerative changes. Electronically Signed   By: Anner Crete M.D.   On: 05/09/2017 22:04   Ct Cervical Spine Wo Contrast  Result Date: 05/09/2017 CLINICAL DATA:  71 year old female with unwitnessed fall. EXAM: CT HEAD WITHOUT CONTRAST CT CERVICAL SPINE WITHOUT CONTRAST TECHNIQUE: Multidetector CT imaging of the head and cervical spine was performed following the standard protocol without intravenous contrast. Multiplanar CT image reconstructions of the cervical spine were also generated. COMPARISON:  Head CT dated 11/05/2015 FINDINGS: CT HEAD FINDINGS Brain: Mild age-related atrophy and chronic microvascular ischemic changes. There is no acute intracranial hemorrhage. No mass effect or midline shift. No extra-axial fluid collection. Vascular: No hyperdense vessel or unexpected calcification. Skull: Normal. Negative for fracture or focal lesion. Sinuses/Orbits: No acute finding. Other: None. CT CERVICAL SPINE FINDINGS Alignment: No acute subluxation. Skull base and vertebrae: No acute fracture.  Osteopenia. Soft tissues and spinal canal: No prevertebral fluid or swelling. No visible canal hematoma. Disc levels: Multilevel degenerative changes and disc disease. Multilevel endplate irregularity and disc space narrowing. Upper chest: Atherosclerotic calcification of the aortic arch. Other: Bilateral carotid bulb calcified plaques. IMPRESSION: 1. No acute intracranial hemorrhage. 2. Mild age-related atrophy and chronic microvascular ischemic changes. 3. No acute/traumatic cervical spine pathology. Multilevel degenerative changes. Electronically Signed   By: Anner Crete M.D.   On: 05/09/2017 22:04   Mr Brain Wo Contrast  Result Date: 05/10/2017 CLINICAL DATA:  [name] on 05/09/2017.  Slurred speech. EXAM: MRI HEAD WITHOUT CONTRAST TECHNIQUE: Multiplanar, multiecho pulse sequences of  the brain and surrounding structures were obtained without intravenous contrast. COMPARISON:  CT 05/09/2017. FINDINGS: Brain: Diffusion imaging does not show any acute or subacute infarction. There is mild age related volume loss without evidence of any old or acute small or large vessel ischemic changes. No mass lesion, hemorrhage, hydrocephalus or extra-axial collection. No pituitary mass. Vascular: Major vessels at the base of the brain show flow. Skull and upper cervical spine: Negative Sinuses/Orbits: Clear/normal Other: None IMPRESSION: Mild age related volume loss. No acute or focal finding. No traumatic finding. Electronically Signed   By: Nelson Chimes M.D.   On: 05/10/2017 08:34   Assessment and plan discussed with with attending physician and they  are in agreement.    Etta Quill PA-C [facility] [phone]  05/10/2017, 1:25 PM   Assessment: 71 y.o. female presented to the hospital secondary to fall along with slurred speech x2.  Currently patient slurred speech has resolved.  As noted above MRI brain did not show any acute findings.  Patient has been noted to have some memory deficits over the past few months which is concerning for neuro degenerative decline.  She will need outpatient follow-up for this however this cannot be done inpatient.  Given her stroke risk factors patient will be worked up for TIA/stroke,  in addition given her age and neuro degenerative decline we will also evaluate patient for possible seizures.  Stroke Risk Factors - hyperlipidemia, hypertension and smoking  Recommend -HgbA1c, fasting lipid panel -MRA of the brain without contrast -PT consult, OT consult, Speech consult -Echocardiogram -80 mg of Atorvistatin -Prophylactic therapy-Antiplatelet med: Continue aspirin Plavix -Risk factor modification -Telemetry monitoring -Frequent neuro checks -NPO until passes stroke swallow screen -Check B12 TSH -EEG   please page stroke NP  Or  PA  Or  MD from 8am -4 pm  as this patient from this time will be  followed by the stroke.   You can look them up on [email or web address]  [place]  Attending Neurohospitalist Addendum Patient seen and examined. Agree with the history and physical as documented above. I have personally reviewed the images. CT scan of the head and MRI of the brain did not show any acute changes.  IMPRESSION AND RECS 71 year old woman with a history hypertension hyperlipidemia depression anxiety previous history of alcoholism on multiple sedating medications came in with transient slurred speech. Differentials include toxic metabolic encephalopathy secondary to drug versus TIA. Memory disorder w/u to be done as outpatient. Seizures also in differential.  RECS: We have recommended a TIA workup given her risk factors as above. Needs outpatient w/u for memory disorder and cognitive impairment. Check B12 TSH Please call with questions. Plan relayed to Dr. Ree Kida (primary hospitalist)  --- Amie Portland, MD [facility] [phone]  If 7pm to 7am, please call on call as listed on AMION.

## 2017-05-10 NOTE — Evaluation (Addendum)
Physical Therapy Evaluation Patient Details Name: Angelica Kelley MRN: [id number] DOB: December 16, 1945 Today's Date: 05/10/2017   History of Present Illness  Patient is a 71 y/o female who presents with slurred speech, s/p fall. Head CT/Brain MRI- unremarkable. +for benzos. Pt with skin tear in left elbow and abrasion in left forehead. PMH includes HTN, HLD, depression, alcoholism, anxiety.   Clinical Impression  Patient presents with confusion, soft BP, impulsivity, poor safety awareness and impaired mobility s/p above. Pt independent PTA and drives. Pt falls asleep mid sentence during session. Not sure of pt's cognitive baseline but mobility limited due to lethargy. Concern about pt being home alone. Tolerated short distance ambulation with Min guard assist for safety. Supine BP 112/63, sitting BP 109/74, standing BP 103/44. Pt asymptomatic. Reports falls at home but not able to state how much. Recommend initial supervision at home for safety. Will follow acutely to maximize independence and mobility and further assess cognition prior to return home.     Follow Up Recommendations Home health PT;Supervision for mobility/OOB    Equipment Recommendations  None recommended by PT    Recommendations for Other Services OT consult     Precautions / Restrictions Precautions Precautions: Fall Precaution Comments: watch BP Restrictions Weight Bearing Restrictions: No      Mobility  Bed Mobility Overal bed mobility: Needs Assistance Bed Mobility: Supine to Sit;Sit to Supine     Supine to sit: Supervision;HOB elevated Sit to supine: Supervision;HOB elevated   General bed mobility comments: No assist needed, no use of rail.   Transfers Overall transfer level: Needs assistance Equipment used: None Transfers: Sit to/from Stand Sit to Stand: Min guard         General transfer comment: Impulsive to stand from EOB; stood from EOB x1, from toilet x1. Min guard for safety.    Ambulation/Gait Ambulation/Gait assistance: Min guard Ambulation Distance (Feet): 15 Feet(x2 bouts) Assistive device: None Gait Pattern/deviations: Step-through pattern;Decreased stride length;Staggering left;Staggering right     General Gait Details: Pt quickly jumped up to go to bathroom; Min guard for safety. Grabbing onto counter for support.   Stairs            Wheelchair Mobility    Modified Rankin (Stroke Patients Only) Modified Rankin (Stroke Patients Only) Pre-Morbid Rankin Score: Moderate disability Modified Rankin: Moderately severe disability     Balance Overall balance assessment: Needs assistance Sitting-balance support: Feet supported;No upper extremity supported Sitting balance-Leahy Scale: Good Sitting balance - Comments: Able to perform pericare without assist or difficulty.    Standing balance support: During functional activity Standing balance-Leahy Scale: Fair Standing balance comment: Able to donn pants in standing and wash hands at sink reaching outside BoS without LOB.                             Pertinent Vitals/Pain Pain Assessment: No/denies pain    Home Living Family/patient expects to be discharged to:: Private residence Living Arrangements: Alone Available Help at Discharge: Family;Available PRN/intermittently Type of Home: Mobile home Home Access: Stairs to enter Entrance Stairs-Rails: Right Entrance Stairs-Number of Steps: 4 Home Layout: One level Home Equipment: None      Prior Function Level of Independence: Independent         Comments: Reports falls but not able to state how many. Still drives.      Hand Dominance   Dominant Hand: Right    Extremity/Trunk Assessment   Upper Extremity Assessment Upper Extremity  Assessment: Defer to OT evaluation    Lower Extremity Assessment Lower Extremity Assessment: Generalized weakness    Cervical / Trunk Assessment Cervical / Trunk Assessment: Kyphotic   Communication   Communication: No difficulties  Cognition Arousal/Alertness: Lethargic(falling asleep mid conversation) Behavior During Therapy: Impulsive Overall Cognitive Status: No family/caregiver present to determine baseline cognitive functioning Area of Impairment: Orientation;Attention;Memory;Safety/judgement;Awareness;Problem solving                 Orientation Level: Situation Current Attention Level: Sustained Memory: Decreased short-term memory   Safety/Judgement: Decreased awareness of safety;Decreased awareness of deficits Awareness: Intellectual Problem Solving: Requires verbal cues;Slow processing General Comments: Pt impulsive with movement, jumping up from bed after being told to wait to be detached from BP cuff; falling asleep mid sentence with audible snoring. Not a great historian. Tangential in speech. States she fell at her friends house but per MD notes, pt fell at home in laundry room.       General Comments General comments (skin integrity, edema, etc.): No family members present during session. Supine BP 112/63, Sitting BP 109/74, Standing BP 103/44.    Exercises     Assessment/Plan    PT Assessment Patient needs continued PT services  PT Problem List Decreased strength;Decreased balance;Decreased cognition;Cardiopulmonary status limiting activity;Decreased mobility;Decreased activity tolerance;Decreased safety awareness       PT Treatment Interventions DME instruction;Functional mobility training;Balance training;Patient/family education;Gait training;Therapeutic activities;Stair training;Therapeutic exercise;Neuromuscular re-education;Cognitive remediation    PT Goals (Current goals can be found in the Care Plan section)  Acute Rehab PT Goals Patient Stated Goal: to go home PT Goal Formulation: With patient Time For Goal Achievement: 05/24/17 Potential to Achieve Goals: Fair    Frequency Min 3X/week   Barriers to discharge Decreased  caregiver support lives alone and stairs to enter home    Co-evaluation               AM-PAC PT "6 Clicks" Daily Activity  Outcome Measure Difficulty turning over in bed (including adjusting bedclothes, sheets and blankets)?: None Difficulty moving from lying on back to sitting on the side of the bed? : None Difficulty sitting down on and standing up from a chair with arms (e.g., wheelchair, bedside commode, etc,.)?: None Help needed moving to and from a bed to chair (including a wheelchair)?: A Little Help needed walking in hospital room?: A Little Help needed climbing 3-5 steps with a railing? : A Little 6 Click Score: 21    End of Session Equipment Utilized During Treatment: Gait belt Activity Tolerance: Patient tolerated treatment well Patient left: in bed;with call bell/phone within reach;with bed alarm set Nurse Communication: Mobility status PT Visit Diagnosis: Unsteadiness on feet (R26.81);Muscle weakness (generalized) (M62.81);History of falling (Z91.81)    Time: 4259-5638 PT Time Calculation (min) (ACUTE ONLY): 22 min   Charges:   PT Evaluation $PT Eval Low Complexity: 1 Low     PT G Codes:   PT G-Codes **NOT FOR INPATIENT CLASS** Functional Assessment Tool Used: Clinical judgement Functional Limitation: Mobility: Walking and moving around Mobility: Walking and Moving Around Current Status (V5643): At least 1 percent but less than 20 percent impaired, limited or restricted Mobility: Walking and Moving Around Goal Status [phone]): At least 1 percent but less than 20 percent impaired, limited or restricted    [place], [place], [place] 05/10/2017, 11:23 AM

## 2017-05-10 NOTE — Evaluation (Signed)
Occupational Therapy Evaluation Patient Details Name: Angelica Kelley MRN: [id number] DOB: [birth year] Today's Date: 05/10/2017    History of Present Illness Patient is a 71 y/o female who presents with slurred speech, s/p fall. Head CT/Brain MRI- unremarkable. +for benzos. Pt with skin tear in left elbow and abrasion in left forehead. PMH includes HTN, HLD, depression, alcoholism, anxiety.    Clinical Impression   Pt reports she was independent with ADL PTA with hx of falls. Currently pt requires min assist overall for ADL and functional mobility. Pt presenting with impaired cognition, poor balance, decreased safety awareness and generalized weakness impacting her independence and safety with ADL and functional mobility. Recommending HHOT and 24/7 supervision for follow up. If family unable to provide 24/7 supervision; may need to consider SNF. Pt would benefit from continued skilled OT to address established goals.    Follow Up Recommendations  Home health OT;Supervision/Assistance - 24 hour(if family unable to provide 24/7; may need SNF)    Equipment Recommendations  3 in 1 bedside commode    Recommendations for Other Services       Precautions / Restrictions Precautions Precautions: Fall Precaution Comments: watch BP Restrictions Weight Bearing Restrictions: No      Mobility Bed Mobility Overal bed mobility: Needs Assistance Bed Mobility: Supine to Sit;Sit to Supine     Supine to sit: Supervision Sit to supine: Supervision   General bed mobility comments: increased time and effort. Supervision for safety  Transfers Overall transfer level: Needs assistance Equipment used: None Transfers: Sit to/from Stand Sit to Stand: Min assist         General transfer comment: for balance    Balance Overall balance assessment: Needs assistance Sitting-balance support: Feet supported;No upper extremity supported Sitting balance-Leahy Scale: Good Sitting balance - Comments:  Able to perform pericare without assist or difficulty.    Standing balance support: No upper extremity supported;During functional activity Standing balance-Leahy Scale: Poor Standing balance comment: min assist for dynamic standing balance                           ADL either performed or assessed with clinical judgement   ADL Overall ADL's : Needs assistance/impaired Eating/Feeding: Set up;Sitting   Grooming: Minimal assistance;Sitting   Upper Body Bathing: Minimal assistance;Sitting   Lower Body Bathing: Minimal assistance;Sit to/from stand   Upper Body Dressing : Minimal assistance;Sitting   Lower Body Dressing: Minimal assistance;Sit to/from stand   Toilet Transfer: Minimal assistance;Ambulation;Regular Toilet           Functional mobility during ADLs: Minimal assistance       Vision Baseline Vision/History: No visual deficits Patient Visual Report: No change from baseline Additional Comments: Needs further assessment     Perception     Praxis      Pertinent Vitals/Pain Pain Assessment: No/denies pain     Hand Dominance Right   Extremity/Trunk Assessment Upper Extremity Assessment Upper Extremity Assessment: Generalized weakness   Lower Extremity Assessment Lower Extremity Assessment: Defer to PT evaluation   Cervical / Trunk Assessment Cervical / Trunk Assessment: Kyphotic   Communication Communication Communication: No difficulties   Cognition Arousal/Alertness: Lethargic Behavior During Therapy: Impulsive Overall Cognitive Status: No family/caregiver present to determine baseline cognitive functioning    General Comments    Exercises     Shoulder Instructions      Home Living Family/patient expects to be discharged to:: Private residence Living Arrangements: Alone Available Help at Discharge: Family;Available PRN/intermittently Type  of Home: Mobile home Home Access: Stairs to enter [employer] of Steps:  4 Entrance Stairs-Rails: Right Home Layout: One level     Bathroom Shower/Tub: Teacher, early years/pre: Standard     Home Equipment: None          Prior Functioning/Environment Level of Independence: Independent        Comments: Reports falls but not able to state how many. Still drives.         OT Problem List: Decreased strength;Decreased activity tolerance;Impaired balance (sitting and/or standing);Impaired vision/perception;Decreased cognition;Decreased safety awareness;Decreased knowledge of use of DME or AE      OT Treatment/Interventions: Self-care/ADL training;Therapeutic exercise;Neuromuscular education;Energy conservation;DME and/or AE instruction;Therapeutic activities;Cognitive remediation/compensation;Patient/family education;Balance training;Visual/perceptual remediation/compensation    OT Goals(Current goals can be found in the care plan section) Acute Rehab OT Goals Patient Stated Goal: to go home OT Goal Formulation: With patient Time For Goal Achievement: 05/24/17 Potential to Achieve Goals: Good ADL Goals Pt Will Perform Grooming: with supervision;standing Pt Will Perform Upper Body Bathing: with supervision;sitting Pt Will Perform Lower Body Bathing: with supervision;sit to/from stand Pt Will Transfer to Toilet: with supervision;ambulating;bedside commode Pt Will Perform Toileting - Clothing Manipulation and hygiene: with supervision;sit to/from stand Pt Will Perform Tub/Shower Transfer: Tub transfer;with supervision;ambulating;3 in 1  OT Frequency: Min 2X/week   Barriers to D/C: Decreased caregiver support  pt lives alone       Co-evaluation              AM-PAC PT "6 Clicks" Daily Activity     Outcome Measure Help from another person eating meals?: A Little Help from another person taking care of personal grooming?: A Little Help from another person toileting, which includes using toliet, bedpan, or urinal?: A Little Help  from another person bathing (including washing, rinsing, drying)?: A Little Help from another person to put on and taking off regular upper body clothing?: A Little Help from another person to put on and taking off regular lower body clothing?: A Little 6 Click Score: 18   End of Session Equipment Utilized During Treatment: Gait belt  Activity Tolerance: Patient tolerated treatment well Patient left: in bed;with call bell/phone within reach  OT Visit Diagnosis: Unsteadiness on feet (R26.81);Other abnormalities of gait and mobility (R26.89)                Time: 6834-1962 OT Time Calculation (min): 13 min Charges:  OT General Charges $OT Visit: 1 Visit OT Evaluation $OT Eval Moderate Complexity: 1 Mod G-Codes: OT G-codes **NOT FOR INPATIENT CLASS** Functional Assessment Tool Used: Clinical judgement Functional Limitation: Self care Self Care Current Status (I2979): At least 20 percent but less than 40 percent impaired, limited or restricted Self Care Goal Status (G9211): At least 1 percent but less than 20 percent impaired, limited or restricted   Mel Almond A. Ulice Brilliant, M.S., OTR/L Pager: [place] [facility] 05/10/2017, 1:24 PM

## 2017-05-10 NOTE — Plan of Care (Signed)
Progressing well.  I expect the patient to return home by expected discharge date.

## 2017-05-10 NOTE — ED Notes (Signed)
Consulting provider at bedside (Dr. Blaine Hamper)

## 2017-05-10 NOTE — Progress Notes (Signed)
  Echocardiogram 2D Echocardiogram has been performed.  Merrie Roof F 05/10/2017, 5:06 PM

## 2017-05-10 NOTE — Procedures (Addendum)
  ELECTROENCEPHALOGRAM REPORT  Date of Study: 05/10/17  Patient's Name: Angelica Kelley MRN: [id number] Date of Birth: 10/15/1945  Referring Provider:  Etta Quill, PA-C  Clinical History: Angelica Kelley is a 71 y.o. female with history of hypertension, hyperlipidemia, depression, anxiety, previous alcoholism.  Patient presents the hospital after she was noted to have slurred speech x2 yesterday along with a fall.  Neurology was consulted for possible TIA.  Thus far workup has shown a hemoglobin A1c of 5.4, LDL of 50.  MRI brain showed mild age-related volume loss but no acute intracranial findings.  Patient is on aspirin and statin twitched medications are given to her by her daughter.  It is noted that patient is on significant amount of sedating medications including Xanax, Wellbutrin, Lamictal, sertraline.  In discussion with the daughter it has been noted that over the past months if not longer patient has been having difficulty with getting lost and remembering things.  In addition she is on Wellbutrin which can lower the seizure threshold.  Patient has been noted to have some memory deficits over the past few months which is concerning for neuro degenerative decline.   Medications: Scheduled Meds: . aspirin EC  81 mg Oral Daily  . bisoprolol  5 mg Oral Daily  . buPROPion  450 mg Oral Daily  . cholecalciferol  5,000 Units Oral Daily  . enoxaparin (LOVENOX) injection  40 mg Subcutaneous Q24H  . gabapentin  300 mg Oral QHS  . lamoTRIgine  400 mg Oral Daily  . pantoprazole  40 mg Oral Daily  . rosuvastatin  20 mg Oral Daily  . sertraline  200 mg Oral Daily   Continuous Infusions: . sodium chloride 100 mL/hr at 05/10/17 1800   PRN Meds:.acetaminophen **OR** acetaminophen (TYLENOL) oral liquid 160 mg/5 mL **OR** acetaminophen, ALPRAZolam, hydrALAZINE, ondansetron (ZOFRAN) IV, senna-docusate, zolpidem            Technical Summary: This is a standard 16 channel EEG recording performed  according to the international 10-20 electrode system.  AP bipolar, transverse bipolar, and referential montages were obtained, and digitally reformatted as necessary.  Duration of tracing: 21:49  Description: In the awake state there is a disorganized 6 Hz theta rhythm seen from the posterior head regions in a symmetric fashion.  Frontotemporal muscle artifact was intermittently noted.  No well defined drowsiness or stage II sleep was identified.   Hyperventilation and photic stimulation were performed without notable changes.  The patient did indicate seeing "lights turning red" during photic stimulation, however no epileptiform changes were noted. HV was not performed.  EKG was monitored and noted to be sinus rhythym with an average heart rate of 60 bpm.  No epileptiform changes were seen during the tracing.  Impression: This is an abnormal EEG due to mild background slowing seen throughout the tracing.  This is a non-specific finding that can be seen with toxic, metabolic, diffuse, or multifocal structural processes.  No definite epileptiform changes were noted.   A single EEG without epileptiform changes does not exclude the diagnosis of epilepsy. Clinical correlation advised.   Carvel Getting, M.D. Neurology Cell [phone]

## 2017-05-10 NOTE — Care Management Obs Status (Signed)
[place] NOTIFICATION   Patient Details  Name: Angelica Kelley MRN: [id number] Date of Birth: Oct 26, 1945   Medicare Observation Status Notification Given:  Yes    Pollie Friar, RN 05/10/2017, 5:05 PM

## 2017-05-10 NOTE — Care Management Note (Addendum)
Case Management Note  Patient Details  Name: Angelica Kelley MRN: [id number] Date of Birth: Oct 05, 1945  Subjective/Objective:     Pt in with slurred speech. She is from home alone.              Action/Plan: PT recommending [facility] services. Awaiting OT/ST eval. CM following for d/c needs, physician orders.  Addendum: pt states she can have 24/7 supervision either at home or at her nieces home. MD please place order for [facility] services.   Expected Discharge Date:                  Expected Discharge Plan:  [place]  In-House Referral:     Discharge planning Services  CM Consult  Post Acute Care Choice:    Choice offered to:     DME Arranged:    DME Agency:     HH Arranged:    [place] Agency:     Status of Service:  In process, will continue to follow  If discussed at Long Length of Stay Meetings, dates discussed:    Additional Comments:  Pollie Friar, RN 05/10/2017, 11:33 AM

## 2017-05-10 NOTE — Progress Notes (Signed)
PROGRESS NOTE    Angelica Kelley  YCX:[id number] DOB: [birth year] DOA: 05/09/2017 PCP: Unk Pinto, MD   Chief Complaint  Patient presents with  . Fall    Brief Narrative:  71 y.o. female with medical history significant of hypertension, hyperlipidemia, GERD, depression, anxiety, CKD-2, alcohol abuse in remission, who presents with fall and slurred speech.  Assessment & Plan   Slurred speech, suspect TIA -CT head showed no acute or cranial hemorrhage -CT cervical spine showed no acute/symmetrical spine pathology, multilevel degenerative changes. -MRI brain: Mild age-related volume loss. No acute or focal finding. -PT admitted home health -Hemoglobin A1c 5.4, LDL 50 -Continue aspirin and statin -Discussed with neurology- will formally consult given patient's age and risk factors  Fall -PT and OT consulted, recommended home health  Essential Hypertension -Continue Ziac   Hyperlipidemia -Continue statin  Alcohol abuse -in remission  Depression/anxiety -Continue xanax, wellbutrin, lamictal, sertraline  Chronic kidney disease, stage III -Upon review patient's chart, her GFR has been stage III since 2015  -continue to monitor BMP  DVT Prophylaxis  lovenox  Code Status: Full  Family Communication: None at bedside  Disposition Plan: Observation, pending further workup  Consultants Neurology   Procedures  none  Antibiotics   Anti-infectives (From admission, onward)   None      Subjective:   Angelica Kelley seen and examined today.  No complaints today. Still somewhat sleepy. Patient denies dizziness, chest pain, shortness of breath, abdominal pain, N/V/D/C.  Objective:   Vitals:   05/10/17 0500 05/10/17 0900 05/10/17 1010 05/10/17 1200  BP: (!) 93/48 112/63 (!) 111/52 (!) 98/52  Pulse: (!) 56 (!) 54 (!) 54 (!) 106  Resp: 14 18 16 18   Temp:   97.6 F (36.4 C)   TempSrc:   Oral   SpO2: 95% 94% 100% 99%  Weight:      Height:        Intake/Output  Summary (Last 24 hours) at 05/10/2017 1240 Last data filed at 05/10/2017 0300 Gross per 24 hour  Intake 58.33 ml  Output -  Net 58.33 ml   Filed Weights   05/09/17 2109  Weight: 70.3 kg (155 lb)    Exam  General: Well developed, well nourished, NAD, appears stated age  71: [place], bruising on left forehead, mucous membranes moist.   Cardiovascular: S1 S2 auscultated, RRR, no murmurs  Respiratory: Clear to auscultation bilaterally with equal chest rise  Abdomen: Soft, nontender, nondistended, + bowel sounds  Extremities: warm dry without cyanosis clubbing or edema  Neuro: AAOx3, nonfocal, somewhat lethargic on exam  Psych: Appropriate   Data Reviewed: I have personally reviewed following labs and imaging studies  CBC: Recent Labs  Lab 05/09/17 2116 05/09/17 2122  WBC 6.3  --   NEUTROABS 4.4  --   HGB 11.5* 11.6*  HCT 36.2 34.0*  MCV 94.0  --   PLT 217  --    Basic Metabolic Panel: Recent Labs  Lab 05/09/17 2116 05/09/17 2122  NA 139 140  K 4.8 4.6  CL 101 100*  CO2 28  --   GLUCOSE 103* 105*  BUN 19 25*  CREATININE 1.22* 1.20*  CALCIUM 9.3  --    GFR: Estimated Creatinine Clearance: 39.1 mL/min (A) (by C-G formula based on SCr of 1.2 mg/dL (H)). Liver Function Tests: Recent Labs  Lab 05/09/17 2116  AST 48*  ALT 17  ALKPHOS 57  BILITOT 1.3*  PROT 6.4*  ALBUMIN 3.7   No results for input(s): LIPASE,  AMYLASE in the last 168 hours. No results for input(s): AMMONIA in the last 168 hours. Coagulation Profile: Recent Labs  Lab 05/09/17 2116  INR 0.95   Cardiac Enzymes: No results for input(s): CKTOTAL, CKMB, CKMBINDEX, TROPONINI in the last 168 hours. BNP (last 3 results) No results for input(s): PROBNP in the last 8760 hours. HbA1C: Recent Labs    05/10/17 0442  HGBA1C 5.4   CBG: Recent Labs  Lab 05/09/17 2113  GLUCAP 87   Lipid Profile: Recent Labs    05/10/17 0442  CHOL 125  HDL 50  LDLCALC 50  TRIG 124  CHOLHDL 2.5    Thyroid Function Tests: No results for input(s): TSH, T4TOTAL, FREET4, T3FREE, THYROIDAB in the last 72 hours. Anemia Panel: No results for input(s): VITAMINB12, FOLATE, FERRITIN, TIBC, IRON, RETICCTPCT in the last 72 hours. Urine analysis:    Component Value Date/Time   COLORURINE YELLOW 05/09/2017 2114   APPEARANCEUR CLEAR 05/09/2017 2114   LABSPEC 1.019 05/09/2017 2114   PHURINE 7.0 05/09/2017 2114   GLUCOSEU NEGATIVE 05/09/2017 2114   HGBUR NEGATIVE 05/09/2017 2114   BILIRUBINUR NEGATIVE 05/09/2017 2114   [place] NEGATIVE 05/09/2017 2114   PROTEINUR NEGATIVE 05/09/2017 2114   UROBILINOGEN 1 06/09/2014 1531   NITRITE NEGATIVE 05/09/2017 2114   LEUKOCYTESUR NEGATIVE 05/09/2017 2114   Sepsis Labs: @LABRCNTIP (procalcitonin:4,lacticidven:4)  )No results found for this or any previous visit (from the past 240 hour(s)).    Radiology Studies: Dg Elbow 2 Views Left  Result Date: 05/09/2017 CLINICAL DATA:  Left elbow pain EXAM: LEFT ELBOW - 2 VIEW COMPARISON:  None. FINDINGS: No fracture or dislocation. No large elbow effusion. Soft tissues are unremarkable IMPRESSION: No acute osseous abnormality Electronically Signed   By: Donavan Foil M.D.   On: 05/09/2017 22:41   Ct Head Wo Contrast  Result Date: 05/09/2017 CLINICAL DATA:  71 year old female with unwitnessed fall. EXAM: CT HEAD WITHOUT CONTRAST CT CERVICAL SPINE WITHOUT CONTRAST TECHNIQUE: Multidetector CT imaging of the head and cervical spine was performed following the standard protocol without intravenous contrast. Multiplanar CT image reconstructions of the cervical spine were also generated. COMPARISON:  Head CT dated 11/05/2015 FINDINGS: CT HEAD FINDINGS Brain: Mild age-related atrophy and chronic microvascular ischemic changes. There is no acute intracranial hemorrhage. No mass effect or midline shift. No extra-axial fluid collection. Vascular: No hyperdense vessel or unexpected calcification. Skull: Normal. Negative  for fracture or focal lesion. Sinuses/Orbits: No acute finding. Other: None. CT CERVICAL SPINE FINDINGS Alignment: No acute subluxation. Skull base and vertebrae: No acute fracture.  Osteopenia. Soft tissues and spinal canal: No prevertebral fluid or swelling. No visible canal hematoma. Disc levels: Multilevel degenerative changes and disc disease. Multilevel endplate irregularity and disc space narrowing. Upper chest: Atherosclerotic calcification of the aortic arch. Other: Bilateral carotid bulb calcified plaques. IMPRESSION: 1. No acute intracranial hemorrhage. 2. Mild age-related atrophy and chronic microvascular ischemic changes. 3. No acute/traumatic cervical spine pathology. Multilevel degenerative changes. Electronically Signed   By: Anner Crete M.D.   On: 05/09/2017 22:04   Ct Cervical Spine Wo Contrast  Result Date: 05/09/2017 CLINICAL DATA:  71 year old female with unwitnessed fall. EXAM: CT HEAD WITHOUT CONTRAST CT CERVICAL SPINE WITHOUT CONTRAST TECHNIQUE: Multidetector CT imaging of the head and cervical spine was performed following the standard protocol without intravenous contrast. Multiplanar CT image reconstructions of the cervical spine were also generated. COMPARISON:  Head CT dated 11/05/2015 FINDINGS: CT HEAD FINDINGS Brain: Mild age-related atrophy and chronic microvascular ischemic changes. There is no acute  intracranial hemorrhage. No mass effect or midline shift. No extra-axial fluid collection. Vascular: No hyperdense vessel or unexpected calcification. Skull: Normal. Negative for fracture or focal lesion. Sinuses/Orbits: No acute finding. Other: None. CT CERVICAL SPINE FINDINGS Alignment: No acute subluxation. Skull base and vertebrae: No acute fracture.  Osteopenia. Soft tissues and spinal canal: No prevertebral fluid or swelling. No visible canal hematoma. Disc levels: Multilevel degenerative changes and disc disease. Multilevel endplate irregularity and disc space narrowing.  Upper chest: Atherosclerotic calcification of the aortic arch. Other: Bilateral carotid bulb calcified plaques. IMPRESSION: 1. No acute intracranial hemorrhage. 2. Mild age-related atrophy and chronic microvascular ischemic changes. 3. No acute/traumatic cervical spine pathology. Multilevel degenerative changes. Electronically Signed   By: Anner Crete M.D.   On: 05/09/2017 22:04   Mr Brain Wo Contrast  Result Date: 05/10/2017 CLINICAL DATA:  [name] on 05/09/2017.  Slurred speech. EXAM: MRI HEAD WITHOUT CONTRAST TECHNIQUE: Multiplanar, multiecho pulse sequences of the brain and surrounding structures were obtained without intravenous contrast. COMPARISON:  CT 05/09/2017. FINDINGS: Brain: Diffusion imaging does not show any acute or subacute infarction. There is mild age related volume loss without evidence of any old or acute small or large vessel ischemic changes. No mass lesion, hemorrhage, hydrocephalus or extra-axial collection. No pituitary mass. Vascular: Major vessels at the base of the brain show flow. Skull and upper cervical spine: Negative Sinuses/Orbits: Clear/normal Other: None IMPRESSION: Mild age related volume loss. No acute or focal finding. No traumatic finding. Electronically Signed   By: Nelson Chimes M.D.   On: 05/10/2017 08:34     Scheduled Meds: . aspirin EC  81 mg Oral Daily  . bisoprolol  5 mg Oral Daily  . buPROPion  450 mg Oral Daily  . cholecalciferol  5,000 Units Oral Daily  . enoxaparin (LOVENOX) injection  40 mg Subcutaneous Q24H  . gabapentin  300 mg Oral QHS  . lamoTRIgine  400 mg Oral Daily  . pantoprazole  40 mg Oral Daily  . rosuvastatin  20 mg Oral Daily  . sertraline  200 mg Oral Daily   Continuous Infusions: . sodium chloride 100 mL/hr at 05/10/17 1000     LOS: 0 days   Time Spent in minutes   30 minutes  Lynden Flemmer D.O. on 05/10/2017 at 12:40 PM  Between 7am to 7pm - Pager - [phone]  After 7pm go to [email or web address] - password  [email or web address]  And look for the night coverage person covering for me after hours  [facility] Office  [phone]

## 2017-05-11 ENCOUNTER — Observation Stay (HOSPITAL_COMMUNITY): Payer: Medicare Other

## 2017-05-11 ENCOUNTER — Encounter (HOSPITAL_COMMUNITY): Payer: Self-pay | Admitting: Radiology

## 2017-05-11 DIAGNOSIS — E782 Mixed hyperlipidemia: Secondary | ICD-10-CM | POA: Diagnosis not present

## 2017-05-11 DIAGNOSIS — N179 Acute kidney failure, unspecified: Secondary | ICD-10-CM | POA: Diagnosis not present

## 2017-05-11 DIAGNOSIS — F1020 Alcohol dependence, uncomplicated: Secondary | ICD-10-CM | POA: Diagnosis not present

## 2017-05-11 DIAGNOSIS — I10 Essential (primary) hypertension: Secondary | ICD-10-CM | POA: Diagnosis not present

## 2017-05-11 DIAGNOSIS — R4781 Slurred speech: Secondary | ICD-10-CM | POA: Diagnosis not present

## 2017-05-11 DIAGNOSIS — W19XXXA Unspecified fall, initial encounter: Secondary | ICD-10-CM | POA: Diagnosis not present

## 2017-05-11 DIAGNOSIS — Z87898 Personal history of other specified conditions: Secondary | ICD-10-CM

## 2017-05-11 DIAGNOSIS — I6523 Occlusion and stenosis of bilateral carotid arteries: Secondary | ICD-10-CM | POA: Diagnosis not present

## 2017-05-11 DIAGNOSIS — F418 Other specified anxiety disorders: Secondary | ICD-10-CM | POA: Diagnosis not present

## 2017-05-11 DIAGNOSIS — G459 Transient cerebral ischemic attack, unspecified: Secondary | ICD-10-CM | POA: Diagnosis not present

## 2017-05-11 LAB — RPR: RPR: NONREACTIVE

## 2017-05-11 MED ORDER — IOPAMIDOL (ISOVUE-370) INJECTION 76%
50.0000 mL | Freq: Once | INTRAVENOUS | Status: AC | PRN
  Administered 2017-05-11: 50 mL via INTRAVENOUS

## 2017-05-11 MED ORDER — ASPIRIN 325 MG PO TBEC: 325.0000 mg | DELAYED_RELEASE_TABLET | Freq: Every day | ORAL | 0 refills | Status: DC

## 2017-05-11 NOTE — Discharge Instructions (Signed)
Fall Prevention in the Home Falls can cause injuries. They can happen to people of all ages. There are many things you can do to make your home safe and to help prevent falls. What can I do on the outside of my home?  Regularly fix the edges of walkways and driveways and fix any cracks.  Remove anything that might make you trip as you walk through a door, such as a raised step or threshold.  Trim any bushes or trees on the path to your home.  Use bright outdoor lighting.  Clear any walking paths of anything that might make someone trip, such as rocks or tools.  Regularly check to see if handrails are loose or broken. Make sure that both sides of any steps have handrails.  Any raised decks and porches should have guardrails on the edges.  Have any leaves, snow, or ice cleared regularly.  Use sand or salt on walking paths during winter.  Clean up any spills in your garage right away. This includes oil or grease spills. What can I do in the bathroom?  Use night lights.  Install grab bars by the toilet and in the tub and shower. Do not use towel bars as grab bars.  Use non-skid mats or decals in the tub or shower.  If you need to sit down in the shower, use a plastic, non-slip stool.  Keep the floor dry. Clean up any water that spills on the floor as soon as it happens.  Remove soap buildup in the tub or shower regularly.  Attach bath mats securely with double-sided non-slip rug tape.  Do not have throw rugs and other things on the floor that can make you trip. What can I do in the bedroom?  Use night lights.  Make sure that you have a light by your bed that is easy to reach.  Do not use any sheets or blankets that are too big for your bed. They should not hang down onto the floor.  Have a firm chair that has side arms. You can use this for support while you get dressed.  Do not have throw rugs and other things on the floor that can make you trip. What can I do in the  kitchen?  Clean up any spills right away.  Avoid walking on wet floors.  Keep items that you use a lot in easy-to-reach places.  If you need to reach something above you, use a strong step stool that has a grab bar.  Keep electrical cords out of the way.  Do not use floor polish or wax that makes floors slippery. If you must use wax, use non-skid floor wax.  Do not have throw rugs and other things on the floor that can make you trip. What can I do with my stairs?  Do not leave any items on the stairs.  Make sure that there are handrails on both sides of the stairs and use them. Fix handrails that are broken or loose. Make sure that handrails are as long as the stairways.  Check any carpeting to make sure that it is firmly attached to the stairs. Fix any carpet that is loose or worn.  Avoid having throw rugs at the top or bottom of the stairs. If you do have throw rugs, attach them to the floor with carpet tape.  Make sure that you have a light switch at the top of the stairs and the bottom of the stairs. If you do  not have them, ask someone to add them for you. °What else can I do to help prevent falls? °· Wear shoes that: °? Do not have high heels. °? Have rubber bottoms. °? Are comfortable and fit you well. °? Are closed at the toe. Do not wear sandals. °· If you use a stepladder: °? Make sure that it is fully opened. Do not climb a closed stepladder. °? Make sure that both sides of the stepladder are locked into place. °? Ask someone to hold it for you, if possible. °· Clearly mark and make sure that you can see: °? Any grab bars or handrails. °? First and last steps. °? Where the edge of each step is. °· Use tools that help you move around (mobility aids) if they are needed. These include: °? Canes. °? Walkers. °? Scooters. °? Crutches. °· Turn on the lights when you go into a dark area. Replace any light bulbs as soon as they burn out. °· Set up your furniture so you have a clear path.  Avoid moving your furniture around. °· If any of your floors are uneven, fix them. °· If there are any pets around you, be aware of where they are. °· Review your medicines with your doctor. Some medicines can make you feel dizzy. This can increase your chance of falling. °Ask your doctor what other things that you can do to help prevent falls. °This information is not intended to replace advice given to you by your health care provider. Make sure you discuss any questions you have with your health care provider. °Document Released: 02/24/2009 Document Revised: 10/06/2015 Document Reviewed: 06/04/2014 °[employer] Interactive Patient Education © 2018 [employer] ° ° °Transient Ischemic Attack °A transient ischemic attack (TIA) is a "warning stroke" that causes stroke-like symptoms. A TIA does not cause lasting damage to the brain. The symptoms of a TIA can happen fast and do not last long. It is important to know the symptoms of a TIA and what to do. This can help prevent stroke or death. °Follow these instructions at home: °· Take medicines only as told by your doctor. Make sure you understand all of the instructions. °· You may need to take aspirin or warfarin medicine. Warfarin needs to be taken exactly as told. °? Taking too much or too little warfarin is dangerous. Blood tests must be done as often as told by your doctor. A PT blood test measures how long it takes for blood to clot. Your PT is used to calculate another value called an INR. Your PT and INR help your doctor adjust your warfarin dosage. He or she will make sure you are taking the right amount. °? Food can cause problems with warfarin and affect the results of your blood tests. This is true for foods high in vitamin K. Eat the same amount of foods high in vitamin K each day. Foods high in vitamin K include spinach, kale, broccoli, cabbage, collard and turnip greens, Brussels sprouts, peas, cauliflower, seaweed, and parsley. Other foods high in  vitamin K include beef and pork liver, green tea, and soybean oil. Eat the same amount of foods high in vitamin K each day. Avoid big changes in your diet. Tell your doctor before changing your diet. Talk to a food specialist (dietitian) if you have questions. °? Many medicines can cause problems with warfarin and affect your PT and INR. Tell your doctor about all medicines you take. This includes vitamins and dietary pills (supplements). Do   not take or stop taking any prescribed or over-the-counter medicines unless your doctor tells you to. °? Warfarin can cause more bruising or bleeding. Hold pressure over any cuts for longer than normal. Talk to your doctor about other side effects of warfarin. °? Avoid sports or activities that may cause injury or bleeding. °? Be careful when you shave, floss, or use sharp objects. °? Avoid or drink very little alcohol while taking warfarin. Tell your doctor if you change how much alcohol you drink. °? Tell your dentist and other doctors that you take warfarin before any procedures. °· Follow your diet program as told, if you are given one. °· Keep a healthy weight. °· Stay active. Try to get at least 30 minutes of activity on all or most days. °· Do not use any tobacco products, including cigarettes, chewing tobacco, or electronic cigarettes. If you need help quitting, ask your doctor. °· Limit alcohol intake to no more than 1 drink per day for nonpregnant women and 2 drinks per day for men. One drink equals 12 ounces of beer, 5 ounces of wine, or 1½ ounces of hard liquor. °· Do not abuse drugs. °· Keep your home safe so you do not fall. You can do this by: °? Putting grab bars in the bedroom and bathroom. °? Raising toilet seats. °? Putting a seat in the shower. °· Keep all follow-up visits as told by your doctor. This is important. °Contact a doctor if: °· Your personality changes. °· You have trouble swallowing. °· You have double vision. °· You are dizzy. °· You have a  fever. °Get help right away if: °These symptoms may be an emergency. Do not wait to see if the symptoms will go away. Get medical help right away. Call your local emergency services (911 in the [place] Do not drive yourself to the hospital. °· You have sudden weakness or lose feeling (go numb), especially on one side of the body. This can affect your: °? Face. °? Arm. °? Leg. °· You have sudden trouble walking. °· You have sudden trouble moving your arms or legs. °· You have sudden confusion. °· You have trouble talking. °· You have trouble understanding. °· You have sudden trouble seeing in one or both eyes. °· You lose your balance. °· Your movements are not smooth. °· You have a sudden, very bad headache with no known cause. °· You have new chest pain. °· Your heartbeat is unsteady. °· You are partly or totally unaware of what is going on around you. ° °This information is not intended to replace advice given to you by your health care provider. Make sure you discuss any questions you have with your health care provider. °Document Released: 02/07/2008 Document Revised: 01/02/2016 Document Reviewed: 08/05/2013 °[employer] Interactive Patient Education © 2018 [employer] ° °

## 2017-05-11 NOTE — Discharge Summary (Signed)
Physician Discharge Summary  Angelica Kelley UMP:[id number] DOB: Dec 06, 1945 DOA: 05/09/2017  PCP: Unk Pinto, MD  Admit date: 05/09/2017 Discharge date: 05/11/2017  Time spent: 45 minutes  Recommendations for Outpatient Follow-up:  Patient will be discharged to home with home health physical and occupational therapy.  Patient will need to follow up with primary care provider within one week of discharge. Follow up with neurology in 6 weeks.  Patient should continue medications as prescribed.  Patient should follow a heart healthy diet.   Discharge Diagnoses:  Slurred speech, suspect TIA Fall Essential Hypertension Hyperlipidemia Alcohol abuse Depression/anxiety Chronic kidney disease, stage III  Discharge Condition: Stable  Diet recommendation: heart healthy  Filed Weights   05/09/17 2109  Weight: 70.3 kg (155 lb)    History of present illness:  On 05/09/2017 by Dr. Ivor Costa Angelica Kelley is a 71 y.o. female with medical history significant of hypertension, hyperlipidemia, GERD, depression, anxiety, CKD-2, alcohol abuse in remission, who presents with fall and slurred speech  Per report, pt was found on the laundry room floor. EMS was called, which time family reported that the patient was having slurred speech. EMS states that she had difficulty walking and some confusion. Pt said she did not have LOC, but could not tell when this happened and what happened. She states that she has been feeling lightheaded and fell. She has skin tear in left elbow and abrasion in left forehead. When I saw pt in ED, she feels normal. She is alert, oriented 3. Her slurred speech has resolved. No unilateral weakness, numbness or tingling to extremities. No facial droop, vision change or hearing loss. Patient denies chest pain, cough, shortness breath, fever, chills. No nausea, vomiting, diarrhea or abdominal pain. Denies symptoms of UTI.  Hospital Course:  Slurred speech, suspect TIA -CT  head showed no acute or cranial hemorrhage -CT cervical spine showed no acute/symmetrical spine pathology, multilevel degenerative changes. -MRI brain: Mild age-related volume loss. No acute or focal finding. -Echocardiogram EF 60-65%, G2DD -PT/OT recommended home health -Hemoglobin A1c 5.4, LDL 50 -Continue aspirin and statin -neurology consulted and appreciated -CTA H/N Atherosclerotic changes at the aortic arch and bilateral carotid bifurcations without significant stenosis. -Discussed with Dr. Erlinda Hong, recommended Aspirin 325mg  daily  Fall -PT and OT consulted, recommended home health  Essential Hypertension -Continue Ziac   Hyperlipidemia -Continue statin  Alcohol abuse -in remission  Depression/anxiety -Continue xanax, wellbutrin, lamictal, sertraline  Chronic kidney disease, stage III -Upon review patient's chart, her GFR has been stage III since 2015  -continue to monitor BMP  Consultants Neurology   Procedures  Echocardiogram  Discharge Exam: Vitals:   05/11/17 0132 05/11/17 1139  BP: 106/79 (!) 95/44  Pulse:  (!) 57  Resp: 18 18  Temp: 98.4 F (36.9 C) 98.9 F (37.2 C)  SpO2: 100% 96%     General: Well developed, elderly, NAD  HEENT: [place], left forehead bruising, mucous membranes moist.  Cardiovascular: S1 S2 auscultated, RRR, no murmur  Respiratory: Clear to auscultation bilaterally with equal chest rise  Abdomen: Soft, nontender, nondistended, + bowel sounds  Extremities: warm dry without cyanosis clubbing or edema  Neuro: AAOx2, nonfocal  Skin: Without rashes exudates or nodules  Psych: appropriate   Discharge Instructions Discharge Instructions    Discharge instructions   Complete by:  As directed    Patient will be discharged to home with home health physical and occupational therapy.  Patient will need to follow up with primary care provider within one week  of discharge. Follow up with neurology in 6 weeks.  Patient should  continue medications as prescribed.  Patient should follow a heart healthy diet.     Allergies as of 05/11/2017      Reactions   Lipitor [atorvastatin]    Fatigue   Prednisone Other (See Comments)   Change in mental status      Medication List    TAKE these medications   ALPRAZolam 1 MG tablet Commonly known as:  XANAX Take 1 mg by mouth 3 (three) times daily as needed for anxiety. For anxiety.   aspirin 325 MG EC tablet Take 1 tablet (325 mg total) by mouth daily. Start taking on:  05/12/2017 What changed:    medication strength  how much to take   bisoprolol-hydrochlorothiazide 5-6.25 MG tablet Commonly known as:  ZIAC take 1 tablet by mouth once daily   buPROPion 150 MG 24 hr tablet Commonly known as:  WELLBUTRIN XL Take 450 mg by mouth daily.   esomeprazole 40 MG capsule Commonly known as:  NEXIUM take 1 capsule by mouth once daily AT NOON   gabapentin 600 MG tablet Commonly known as:  NEURONTIN Take 300 mg by mouth at bedtime.   hydrochlorothiazide 12.5 MG capsule Commonly known as:  MICROZIDE take 1 capsule by mouth once daily for blood pressure and FLUID   lamoTRIgine 200 MG tablet Commonly known as:  LAMICTAL Take 400 mg by mouth daily.   rosuvastatin 40 MG tablet Commonly known as:  CRESTOR take 1/2 to 1 tablet by mouth once daily What changed:  See the new instructions.   sertraline 100 MG tablet Commonly known as:  ZOLOFT Take 200 mg by mouth daily.   Vitamin D3 5000 units Caps Take 5,000 Units by mouth daily.      Allergies  Allergen Reactions  . Lipitor [Atorvastatin]     Fatigue  . Prednisone Other (See Comments)    Change in mental status   Follow-up Information    Unk Pinto, MD. Schedule an appointment as soon as possible for a visit in 1 week(s).   Specialty:  Internal Medicine Why:  Hospital follow up Contact information: [address] [place] [address] [phone]        Rosalin Hawking, MD.  Schedule an appointment as soon as possible for a visit in 1 week(s).   Specialty:  Neurology Why:  Hospital follow up Contact information: [place]  [address] [phone]            The results of significant diagnostics from this hospitalization (including imaging, microbiology, ancillary and laboratory) are listed below for reference.    Significant Diagnostic Studies: Ct Angio Head W Or Wo Contrast  Result Date: 05/11/2017 CLINICAL DATA:  Episode of slurred speech 2 days ago. Dementia with progressive forgetfulness. EXAM: CT ANGIOGRAPHY HEAD AND NECK TECHNIQUE: Multidetector CT imaging of the head and neck was performed using the standard protocol during bolus administration of intravenous contrast. Multiplanar CT image reconstructions and MIPs were obtained to evaluate the vascular anatomy. Carotid stenosis measurements (when applicable) are obtained utilizing NASCET criteria, using the distal internal carotid diameter as the denominator. CONTRAST:  61mL ISOVUE-370 IOPAMIDOL (ISOVUE-370) INJECTION 76% COMPARISON:  CT of the head 05/09/2017.  MRI brain 05/10/2017. FINDINGS: CT HEAD FINDINGS Brain: No acute infarct, hemorrhage, or mass lesion is present. Mild atrophy and white matter changes are within normal limits for age and stable. Brainstem and cerebellum are normal. Vascular: Vascular calcifications are present  in the cavernous internal carotid artery is. There is no hyperdense vessel. Skull: Calvarium is intact. No focal lytic or blastic lesions are present. There are no healing fractures. Sinuses: The paranasal sinuses and mastoid air cells are clear. Orbits: Bilateral globes and orbits are within normal limits. Review of the MIP images confirms the above findings CTA NECK FINDINGS Aortic arch: Extensive atherosclerotic changes are present within the aortic arch. A 4 vessel arch configuration is present. A hypoplastic left vertebral artery originates  directly from the aortic arch. Atherosclerotic calcifications are associated with the great vessel origins without a significant stenosis. Right carotid system: The right common carotid artery demonstrates some tortuosity. There is no focal stenosis. Mild atherosclerotic changes are present at the right carotid bifurcation. Distal tortuosity is present in the cervical right ICA without a significant stenosis. Left carotid system: The left common carotid artery is within normal limits. Atherosclerotic changes are present at the left carotid bifurcation. There is no significant stenosis. Moderate tortuosity is present just below the skullbase without significant stenosis. Vertebral arteries: The left vertebral artery is hypoplastic. It originates directly from the aortic arch without significant stenosis. The right vertebral artery is the dominant vessel. There is moderate tortuosity of the proximal right vertebral artery after originate from the subclavian artery. There is no focal stenosis of the right vertebral artery in the neck. Skeleton: Grade 1 anterolisthesis at C4-5 is degenerative. Endplate changes and uncovertebral spurring are worse on the right at C3-4. There is chronic loss of disc height with uncovertebral spurring at C5-6 greater than C6-7. Additional degenerative changes are present in the upper thoracic spine. Sclerotic changes along the superior endplate at T3 may reflect healing fracture. There is a more remote superior endplate fracture at T5. Other neck: No focal mucosal or submucosal lesions are present. Salivary glands are within normal limits. The thyroid is unremarkable. Upper chest: Centrilobular emphysema is present. Scattered ground-glass attenuation likely represents mild edema. Review of the MIP images confirms the above findings CTA HEAD FINDINGS Anterior circulation: Minimal atherosclerotic calcifications are present within the cavernous internal carotid artery is without a significant  stenosis on either side through the ICA termini. The A1 and M1 segments are within normal limits. The anterior communicating artery is patent. ACA and MCA branch vessels are within normal limits. Posterior circulation: The vertebral artery is low the right vertebral artery is dominant. PICA origins are visualized and normal. Basilar artery is within normal limits. A fetal type left posterior cerebral artery is present. A prominent right posterior communicating artery is present. Venous sinuses: The dural sinuses are patent. The right transverse sinus is patent. Straight sinus and deep cerebral veins are intact. Cortical veins are unremarkable. Anatomic variants: Fetal type left posterior cerebral artery. Delayed phase: Postcontrast images demonstrate no pathologic enhancement. Review of the MIP images confirms the above findings IMPRESSION: 1. Atherosclerotic changes at the aortic arch and bilateral carotid bifurcations without significant stenosis. 2. 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 3. Torturous cervical vessels suggesting chronic hypertension. Electronically Signed   By: San Morelle M.D.   On: 05/11/2017 11:37   Dg Elbow 2 Views Left  Result Date: 05/09/2017 CLINICAL DATA:  Left elbow pain EXAM: LEFT ELBOW - 2 VIEW COMPARISON:  None. FINDINGS: No fracture or dislocation. No large elbow effusion. Soft tissues are unremarkable IMPRESSION: No acute osseous abnormality Electronically Signed   By: Donavan Foil M.D.   On: 05/09/2017 22:41   Ct Head Wo  Contrast  Result Date: 05/09/2017 CLINICAL DATA:  71 year old female with unwitnessed fall. EXAM: CT HEAD WITHOUT CONTRAST CT CERVICAL SPINE WITHOUT CONTRAST TECHNIQUE: Multidetector CT imaging of the head and cervical spine was performed following the standard protocol without intravenous contrast. Multiplanar CT image reconstructions of the cervical spine were also generated. COMPARISON:  Head CT dated  11/05/2015 FINDINGS: CT HEAD FINDINGS Brain: Mild age-related atrophy and chronic microvascular ischemic changes. There is no acute intracranial hemorrhage. No mass effect or midline shift. No extra-axial fluid collection. Vascular: No hyperdense vessel or unexpected calcification. Skull: Normal. Negative for fracture or focal lesion. Sinuses/Orbits: No acute finding. Other: None. CT CERVICAL SPINE FINDINGS Alignment: No acute subluxation. Skull base and vertebrae: No acute fracture.  Osteopenia. Soft tissues and spinal canal: No prevertebral fluid or swelling. No visible canal hematoma. Disc levels: Multilevel degenerative changes and disc disease. Multilevel endplate irregularity and disc space narrowing. Upper chest: Atherosclerotic calcification of the aortic arch. Other: Bilateral carotid bulb calcified plaques. IMPRESSION: 1. No acute intracranial hemorrhage. 2. Mild age-related atrophy and chronic microvascular ischemic changes. 3. No acute/traumatic cervical spine pathology. Multilevel degenerative changes. Electronically Signed   By: Anner Crete M.D.   On: 05/09/2017 22:04   Ct Angio Neck W Or Wo Contrast  Result Date: 05/11/2017 CLINICAL DATA:  Episode of slurred speech 2 days ago. Dementia with progressive forgetfulness. EXAM: CT ANGIOGRAPHY HEAD AND NECK TECHNIQUE: Multidetector CT imaging of the head and neck was performed using the standard protocol during bolus administration of intravenous contrast. Multiplanar CT image reconstructions and MIPs were obtained to evaluate the vascular anatomy. Carotid stenosis measurements (when applicable) are obtained utilizing NASCET criteria, using the distal internal carotid diameter as the denominator. CONTRAST:  36mL ISOVUE-370 IOPAMIDOL (ISOVUE-370) INJECTION 76% COMPARISON:  CT of the head 05/09/2017.  MRI brain 05/10/2017. FINDINGS: CT HEAD FINDINGS Brain: No acute infarct, hemorrhage, or mass lesion is present. Mild atrophy and white matter changes  are within normal limits for age and stable. Brainstem and cerebellum are normal. Vascular: Vascular calcifications are present in the cavernous internal carotid artery is. There is no hyperdense vessel. Skull: Calvarium is intact. No focal lytic or blastic lesions are present. There are no healing fractures. Sinuses: The paranasal sinuses and mastoid air cells are clear. Orbits: Bilateral globes and orbits are within normal limits. Review of the MIP images confirms the above findings CTA NECK FINDINGS Aortic arch: Extensive atherosclerotic changes are present within the aortic arch. A 4 vessel arch configuration is present. A hypoplastic left vertebral artery originates directly from the aortic arch. Atherosclerotic calcifications are associated with the great vessel origins without a significant stenosis. Right carotid system: The right common carotid artery demonstrates some tortuosity. There is no focal stenosis. Mild atherosclerotic changes are present at the right carotid bifurcation. Distal tortuosity is present in the cervical right ICA without a significant stenosis. Left carotid system: The left common carotid artery is within normal limits. Atherosclerotic changes are present at the left carotid bifurcation. There is no significant stenosis. Moderate tortuosity is present just below the skullbase without significant stenosis. Vertebral arteries: The left vertebral artery is hypoplastic. It originates directly from the aortic arch without significant stenosis. The right vertebral artery is the dominant vessel. There is moderate tortuosity of the proximal right vertebral artery after originate from the subclavian artery. There is no focal stenosis of the right vertebral artery in the neck. Skeleton: Grade 1 anterolisthesis at C4-5 is degenerative. Endplate changes and uncovertebral spurring are worse  on the right at C3-4. There is chronic loss of disc height with uncovertebral spurring at C5-6 greater than  C6-7. Additional degenerative changes are present in the upper thoracic spine. Sclerotic changes along the superior endplate at T3 may reflect healing fracture. There is a more remote superior endplate fracture at T5. Other neck: No focal mucosal or submucosal lesions are present. Salivary glands are within normal limits. The thyroid is unremarkable. Upper chest: Centrilobular emphysema is present. Scattered ground-glass attenuation likely represents mild edema. Review of the MIP images confirms the above findings CTA HEAD FINDINGS Anterior circulation: Minimal atherosclerotic calcifications are present within the cavernous internal carotid artery is without a significant stenosis on either side through the ICA termini. The A1 and M1 segments are within normal limits. The anterior communicating artery is patent. ACA and MCA branch vessels are within normal limits. Posterior circulation: The vertebral artery is low the right vertebral artery is dominant. PICA origins are visualized and normal. Basilar artery is within normal limits. A fetal type left posterior cerebral artery is present. A prominent right posterior communicating artery is present. Venous sinuses: The dural sinuses are patent. The right transverse sinus is patent. Straight sinus and deep cerebral veins are intact. Cortical veins are unremarkable. Anatomic variants: Fetal type left posterior cerebral artery. Delayed phase: Postcontrast images demonstrate no pathologic enhancement. Review of the MIP images confirms the above findings IMPRESSION: 1. Atherosclerotic changes at the aortic arch and bilateral carotid bifurcations without significant stenosis. 2. 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 3. Torturous cervical vessels suggesting chronic hypertension. Electronically Signed   By: San Morelle M.D.   On: 05/11/2017 11:37   Ct Cervical Spine Wo Contrast  Result Date: 05/09/2017 CLINICAL DATA:   71 year old female with unwitnessed fall. EXAM: CT HEAD WITHOUT CONTRAST CT CERVICAL SPINE WITHOUT CONTRAST TECHNIQUE: Multidetector CT imaging of the head and cervical spine was performed following the standard protocol without intravenous contrast. Multiplanar CT image reconstructions of the cervical spine were also generated. COMPARISON:  Head CT dated 11/05/2015 FINDINGS: CT HEAD FINDINGS Brain: Mild age-related atrophy and chronic microvascular ischemic changes. There is no acute intracranial hemorrhage. No mass effect or midline shift. No extra-axial fluid collection. Vascular: No hyperdense vessel or unexpected calcification. Skull: Normal. Negative for fracture or focal lesion. Sinuses/Orbits: No acute finding. Other: None. CT CERVICAL SPINE FINDINGS Alignment: No acute subluxation. Skull base and vertebrae: No acute fracture.  Osteopenia. Soft tissues and spinal canal: No prevertebral fluid or swelling. No visible canal hematoma. Disc levels: Multilevel degenerative changes and disc disease. Multilevel endplate irregularity and disc space narrowing. Upper chest: Atherosclerotic calcification of the aortic arch. Other: Bilateral carotid bulb calcified plaques. IMPRESSION: 1. No acute intracranial hemorrhage. 2. Mild age-related atrophy and chronic microvascular ischemic changes. 3. No acute/traumatic cervical spine pathology. Multilevel degenerative changes. Electronically Signed   By: Anner Crete M.D.   On: 05/09/2017 22:04   Mr Brain Wo Contrast  Result Date: 05/10/2017 CLINICAL DATA:  [name] on 05/09/2017.  Slurred speech. EXAM: MRI HEAD WITHOUT CONTRAST TECHNIQUE: Multiplanar, multiecho pulse sequences of the brain and surrounding structures were obtained without intravenous contrast. COMPARISON:  CT 05/09/2017. FINDINGS: Brain: Diffusion imaging does not show any acute or subacute infarction. There is mild age related volume loss without evidence of any old or acute small or large vessel  ischemic changes. No mass lesion, hemorrhage, hydrocephalus or extra-axial collection. No pituitary mass. Vascular: Major vessels at the base of the brain show  flow. Skull and upper cervical spine: Negative Sinuses/Orbits: Clear/normal Other: None IMPRESSION: Mild age related volume loss. No acute or focal finding. No traumatic finding. Electronically Signed   By: Nelson Chimes M.D.   On: 05/10/2017 08:34    Microbiology: No results found for this or any previous visit (from the past 240 hour(s)).   Labs: Basic Metabolic Panel: Recent Labs  Lab 05/09/17 2116 05/09/17 2122  NA 139 140  K 4.8 4.6  CL 101 100*  CO2 28  --   GLUCOSE 103* 105*  BUN 19 25*  CREATININE 1.22* 1.20*  CALCIUM 9.3  --    Liver Function Tests: Recent Labs  Lab 05/09/17 2116  AST 48*  ALT 17  ALKPHOS 57  BILITOT 1.3*  PROT 6.4*  ALBUMIN 3.7   No results for input(s): LIPASE, AMYLASE in the last 168 hours. No results for input(s): AMMONIA in the last 168 hours. CBC: Recent Labs  Lab 05/09/17 2116 05/09/17 2122  WBC 6.3  --   NEUTROABS 4.4  --   HGB 11.5* 11.6*  HCT 36.2 34.0*  MCV 94.0  --   PLT 217  --    Cardiac Enzymes: No results for input(s): CKTOTAL, CKMB, CKMBINDEX, TROPONINI in the last 168 hours. BNP: BNP (last 3 results) No results for input(s): BNP in the last 8760 hours.  ProBNP (last 3 results) No results for input(s): PROBNP in the last 8760 hours.  CBG: Recent Labs  Lab 05/09/17 2113  GLUCAP 87       Signed:  Shafiq Larch  [facility] 05/11/2017, 1:09 PM

## 2017-05-11 NOTE — Care Management (Signed)
Pt and daughter chose [facility] for [facility] PT and OT.  Elmo Putt, intake for [facility] contacted and accepted pt.  SOC will be 05/16/17 or 05/17/17 due to holiday.  Daughter voices approval of plan and knows how to contact the agency.

## 2017-05-11 NOTE — Progress Notes (Signed)
PT Cancellation Note  Patient Details Name: Angelica Kelley MRN: [id number] DOB: 01-Nov-1945   Cancelled Treatment:    Reason Eval/Treat Not Completed: Patient at procedure or test/unavailable, heading to CT will follow up as able   Duncan Dull 05/11/2017, 10:36 AM

## 2017-05-11 NOTE — Plan of Care (Signed)
Pt answers some orientation questions appropriately, but is impulsive and tries to climb OOB often. VSS, NAD. Will continue to monitor.

## 2017-05-11 NOTE — Progress Notes (Signed)
STROKE TEAM PROGRESS NOTE   SUBJECTIVE (INTERVAL HISTORY) Her RN is at the bedside.  Pt is sitting in bed for breakfast. She has no complains. No acute event overnight. She stated that her symptoms all resolved, no speaking normal. MRI neg and CTA head and  Neck showed atherosclerosis at the aortic arch and bilateral carotid bifurcation but no significant stenosis.  Home Medications:  Current Meds  Medication Sig  . ALPRAZolam (XANAX) 1 MG tablet Take 1 mg by mouth 3 (three) times daily as needed for anxiety. For anxiety.   Marland Kitchen aspirin EC 81 MG tablet Take 81 mg by mouth daily.  . bisoprolol-hydrochlorothiazide (ZIAC) 5-6.25 MG tablet take 1 tablet by mouth once daily  . buPROPion (WELLBUTRIN XL) 150 MG 24 hr tablet Take 450 mg by mouth daily.  . Cholecalciferol (VITAMIN D3) 5000 UNITS CAPS Take 5,000 Units by mouth daily.   Marland Kitchen gabapentin (NEURONTIN) 600 MG tablet Take 300 mg by mouth at bedtime.   . hydrochlorothiazide (MICROZIDE) 12.5 MG capsule take 1 capsule by mouth once daily for blood pressure and FLUID  . lamoTRIgine (LAMICTAL) 200 MG tablet Take 400 mg by mouth daily.   . rosuvastatin (CRESTOR) 40 MG tablet take 1/2 to 1 tablet by mouth once daily (Patient taking differently: take 20 MG by mouth once daily)  . sertraline (ZOLOFT) 100 MG tablet Take 200 mg by mouth daily.       Hospital Medications:  Scheduled: . aspirin EC  325 mg Oral Daily  . bisoprolol  5 mg Oral Daily  . buPROPion  450 mg Oral Daily  . cholecalciferol  5,000 Units Oral Daily  . enoxaparin (LOVENOX) injection  40 mg Subcutaneous Q24H  . gabapentin  300 mg Oral QHS  . lamoTRIgine  400 mg Oral Daily  . pantoprazole  40 mg Oral Daily  . rosuvastatin  20 mg Oral Daily  . sertraline  200 mg Oral Daily    OBJECTIVE Temp:  [97.6 F (36.4 C)-98.6 F (37 C)] 98.4 F (36.9 C) (12/29 0132) Pulse Rate:  [54-106] 61 (12/28 2122) Cardiac Rhythm: Normal sinus rhythm;Heart block (12/29 0724) Resp:  [16-18] 18  (12/29 0132) BP: (98-112)/(46-79) 106/79 (12/29 0132) SpO2:  [94 %-100 %] 100 % (12/29 0132)  CBC:  Recent Labs  Lab 05/09/17 2116 05/09/17 2122  WBC 6.3  --   NEUTROABS 4.4  --   HGB 11.5* 11.6*  HCT 36.2 34.0*  MCV 94.0  --   PLT 217  --     Basic Metabolic Panel:  Recent Labs  Lab 05/09/17 2116 05/09/17 2122  NA 139 140  K 4.8 4.6  CL 101 100*  CO2 28  --   GLUCOSE 103* 105*  BUN 19 25*  CREATININE 1.22* 1.20*  CALCIUM 9.3  --     Lipid Panel:     Component Value Date/Time   CHOL 125 05/10/2017 0442   TRIG 124 05/10/2017 0442   HDL 50 05/10/2017 0442   CHOLHDL 2.5 05/10/2017 0442   VLDL 25 05/10/2017 0442   LDLCALC 50 05/10/2017 0442   HgbA1c:  Lab Results  Component Value Date   HGBA1C 5.4 05/10/2017   Urine Drug Screen:     Component Value Date/Time   LABOPIA NONE DETECTED 05/09/2017 2114   COCAINSCRNUR NONE DETECTED 05/09/2017 2114   LABBENZ POSITIVE (A) 05/09/2017 2114   AMPHETMU NONE DETECTED 05/09/2017 2114   THCU NONE DETECTED 05/09/2017 2114   LABBARB NONE DETECTED 05/09/2017 2114  Alcohol Level     Component Value Date/Time   [facility] <10 05/09/2017 2116    IMAGING I have personally reviewed the radiological images below and agree with the radiology interpretations.  Ct Head Wo Contrast 05/09/2017 IMPRESSION:  1. No acute intracranial hemorrhage.  2. Mild age-related atrophy and chronic microvascular ischemic changes.  3. No acute/traumatic cervical spine pathology. Multilevel degenerative changes.   Ct Cervical Spine Wo Contrast 05/09/2017 IMPRESSION:  1. No acute intracranial hemorrhage.  2. Mild age-related atrophy and chronic microvascular ischemic changes.  3. No acute/traumatic cervical spine pathology. Multilevel degenerative changes.   Mr Brain Wo Contrast 05/10/2017 IMPRESSION:  Mild age related volume loss. No acute or focal finding. No traumatic finding.   CTA Head and Neck 1. Atherosclerotic changes at the  aortic arch and bilateral carotid bifurcations without significant stenosis. 2. 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 3. Torturous cervical vessels suggesting chronic hypertension.  Transthoracic Echocardiogram  05/10/2017 Study Conclusions - Left ventricle: The cavity size was normal. Systolic function was   normal. The estimated ejection fraction was in the range of 60%   to 65%. Wall motion was normal; there were no regional wall   motion abnormalities. Features are consistent with a pseudonormal   left ventricular filling pattern, with concomitant abnormal   relaxation and increased filling pressure (grade 2 diastolic   dysfunction). Doppler parameters are consistent with elevated   ventricular end-diastolic filling pressure. - Aortic valve: Functionally bicuspid; moderately thickened,   moderately calcified leaflets. There was mild to moderate   stenosis. There was trivial regurgitation. Mean gradient (S): 16   mm Hg. Peak gradient (S): 28 mm Hg. Valve area (VTI): 1.09 cm^2.   Valve area (Vmax): 1.21 cm^2. Valve area (Vmean): 1.07 cm^2. - Aortic root: The aortic root was normal in size. - Mitral valve: There was mild regurgitation. - Left atrium: The atrium was severely dilated. - Right ventricle: Systolic function was normal. - Right atrium: The atrium was mildly dilated. - Tricuspid valve: There was severe regurgitation. - Pulmonic valve: There was no regurgitation. - Pulmonary arteries: Systolic pressure was moderately increased.   PA peak pressure: 46 mm Hg (S). - Inferior vena cava: The vessel was normal in size. - Pericardium, extracardiac: There was no pericardial effusion.  EEG 05/10/2017 Impression: This is an abnormal EEG due to mild background slowing seen  throughout the tracing. This is a non-specific finding that can  be seen with toxic, metabolic, diffuse, or multifocal structural  processes. No definite epileptiform  changes were noted.  A  single EEG without epileptiform changes does not exclude the  diagnosis of epilepsy. Clinical correlation advised.    PHYSICAL EXAM Vitals:   05/10/17 1010 05/10/17 1200 05/10/17 2122 05/11/17 0132  BP: (!) 111/52 (!) 98/52 (!) 98/46 106/79  Pulse: (!) 54 (!) 106 61   Resp: 16 18 18 18   Temp: 97.6 F (36.4 C)  98.6 F (37 C) 98.4 F (36.9 C)  TempSrc: Oral  Oral Oral  SpO2: 100% 99% 100% 100%  Weight:      Height:        Temp:  [98.9 F (37.2 C)] 98.9 F (37.2 C) (12/29 1139) Pulse Rate:  [57] 57 (12/29 1139) Resp:  [18] 18 (12/29 1139) BP: (95)/(44) 95/44 (12/29 1139) SpO2:  [96 %] 96 % (12/29 1139)  General - Well nourished, well developed, in no apparent distress.  Ophthalmologic - Fundi not visualized due to noncooperation.  Cardiovascular - Regular rate and rhythm.  Mental Status -  Level of arousal and orientation to time, place, and person were intact. Language including expression, naming, repetition, comprehension was assessed and found intact. Fund of Knowledge was assessed and was intact.  Cranial Nerves II - XII - II - Visual field intact OU. III, IV, VI - Extraocular movements intact. V - Facial sensation intact bilaterally. VII - Facial movement intact bilaterally. VIII - Hearing & vestibular intact bilaterally. X - Palate elevates symmetrically. XI - Chin turning & shoulder shrug intact bilaterally. XII - Tongue protrusion intact.  Motor Strength - The patient's strength was normal in all extremities and pronator drift was absent.  Bulk was normal and fasciculations were absent.   Motor Tone - Muscle tone was assessed at the neck and appendages and was normal.  Reflexes - The patient's reflexes were 1+ in all extremities and she had no pathological reflexes.  Sensory - Light touch, temperature/pinprick were assessed and were symmetrical.    Coordination - The patient had normal movements in the hands and feet with no  ataxia or dysmetria.  Tremor was absent.  Gait and Station - deferred   ASSESSMENT/PLAN Angelica Kelley is a 71 y.o. female with history of hypertension, hyperlipidemia, depression, anxiety, previous alcoholism, and on multiple sedating medications  presenting with slurred speech, a fall, and recent memory deficits. She did not receive IV t-PA due resolution of deficits.   Possible TIA vs. Psycho active medication effect    Resultant back to baseline  CT head - no acute findings  MRI head - Mild age related volume loss. No acute or focal finding  CTA H&N - atherosclerosis at the aortic arch and bilateral carotid bifurcation but no significant stenosis.  EEG - mild background slowing   2D Echo - EF 60-65%  LDL - 50  HgbA1c - 5.4  VTE prophylaxis - Lovenox Diet regular Room service appropriate? Yes; Fluid consistency: Thin  aspirin 81 mg daily prior to admission, now on aspirin 325 mg daily.  Continue aspirin 325 on discharge  Patient counseled to be compliant with her antithrombotic medications  Ongoing aggressive stroke risk factor management  Therapy recommendations:  HH OT recommended with 24-hour per day supervision   Disposition:  Home  Depression and anxiety  On home medication of Wellbutrin, Neurontin, high-dose Lamictal and Zoloft  Educated to take medication as instructed  Follow-up with psychiatry and psychology closely  Primary team to arrange medications  Hypertension  Blood pressure tends to run low  Long-term BP goal normotensive  Hyperlipidemia  Home meds: Crestor 20 mg daily resumed in hospital  LDL 50, goal < 70  Continue statin at discharge  Other Stroke Risk Factors  Advanced age  History of alcohol abuse - has quit for 4 years  Other Active Problems  Mildly elevated BUN and creatinine - 25 / 1.20  Hospital day # 0  Neurology will sign off. Please call with questions. Pt will follow up with Cecille Rubin, NP, at [facility] in  about 6 weeks. Thanks for the consult.  Rosalin Hawking, MD PhD Stroke Neurology 05/12/2017 6:59 AM   To contact Stroke Continuity provider, please refer to [email or web address] After hours, contact General Neurology

## 2017-05-16 ENCOUNTER — Ambulatory Visit: Payer: Medicare Other | Admitting: Physician Assistant

## 2017-05-16 ENCOUNTER — Encounter: Payer: Self-pay | Admitting: Physician Assistant

## 2017-05-16 VITALS — BP 128/82 | HR 66 | Temp 97.5°F | Resp 16 | Ht 64.5 in | Wt 143.2 lb

## 2017-05-16 DIAGNOSIS — F3130 Bipolar disorder, current episode depressed, mild or moderate severity, unspecified: Secondary | ICD-10-CM

## 2017-05-16 DIAGNOSIS — F1020 Alcohol dependence, uncomplicated: Secondary | ICD-10-CM

## 2017-05-16 DIAGNOSIS — N183 Chronic kidney disease, stage 3 unspecified: Secondary | ICD-10-CM

## 2017-05-16 DIAGNOSIS — E782 Mixed hyperlipidemia: Secondary | ICD-10-CM | POA: Diagnosis not present

## 2017-05-16 DIAGNOSIS — I959 Hypotension, unspecified: Secondary | ICD-10-CM | POA: Diagnosis not present

## 2017-05-16 DIAGNOSIS — F319 Bipolar disorder, unspecified: Secondary | ICD-10-CM

## 2017-05-16 DIAGNOSIS — R4781 Slurred speech: Secondary | ICD-10-CM

## 2017-05-16 DIAGNOSIS — I10 Essential (primary) hypertension: Secondary | ICD-10-CM

## 2017-05-16 DIAGNOSIS — W19XXXD Unspecified fall, subsequent encounter: Secondary | ICD-10-CM

## 2017-05-16 MED ORDER — HYDROCHLOROTHIAZIDE 12.5 MG PO CAPS: ORAL_CAPSULE | ORAL | 1 refills | Status: DC

## 2017-05-16 NOTE — Patient Instructions (Addendum)
GET ON COMPRESSION SOCKS AT HOME- CAN GET FROM [employer] FOR CHEAP CUT THE FLUID PILL IN HALF OR SINCE IT IS SMALL TAKE IT EVERY OTHER DAY  KEEP TRACK OF YOUR BP AT HOME BRING INTO YOUR NEXT APPOINTMENT  Your ears and sinuses are connected by the eustachian tube. When your sinuses are inflamed, this can close off the tube and cause fluid to collect in your middle ear. This can then cause dizziness, popping, clicking, ringing, and echoing in your ears. This is often NOT an infection and does NOT require antibiotics, it is caused by inflammation so the treatments help the inflammation. This can take a long time to get better so please be patient.  Here are things you can do to help with this: - Try the Flonase or Nasonex. Remember to spray each nostril twice towards the outer part of your eye.  Do not sniff but instead pinch your nose and tilt your head back to help the medicine get into your sinuses.  The best time to do this is at bedtime.Stop if you get blurred vision or nose bleeds.  -While drinking fluids, pinch and hold nose close and swallow, to help open eustachian tubes to drain fluid behind ear drums. -Please pick one of the over the counter allergy medications below and take it once daily for allergies.  It will also help with fluid behind ear drums. Claritin or loratadine cheapest but likely the weakest  Zyrtec or certizine at night because it can make you sleepy The strongest is allegra or fexafinadine  Cheapest at [employer], [employer], [employer] -can use decongestant over the counter, please do not use if you have high blood pressure or certain heart conditions.   if worsening HA, changes vision/speech, imbalance, weakness go to the ER   Orthostatic Hypotension Orthostatic hypotension is a sudden drop in blood pressure that happens when you quickly change positions, such as when you get up from a seated or lying position. Blood pressure is a measurement of how strongly, or weakly, your blood is  pressing against the walls of your arteries. Arteries are blood vessels that carry blood from your heart throughout your body. When blood pressure is too low, you may not get enough blood to your brain or to the rest of your organs. This can cause weakness, light-headedness, rapid heartbeat, and fainting. This can last for just a few seconds or for up to a few minutes. Orthostatic hypotension is usually not a serious problem. However, if it happens frequently or gets worse, it may be a sign of something more serious. What are the causes? This condition may be caused by:  Sudden changes in posture, such as standing up quickly after you have been sitting or lying down.  Blood loss.  Loss of body fluids (dehydration).  Heart problems.  Hormone (endocrine) problems.  Pregnancy.  Severe infection.  Lack of certain nutrients.  Severe allergic reactions (anaphylaxis).  Certain medicines, such as blood pressure medicine or medicines that make the body lose excess fluids (diuretics). Sometimes, this condition can be caused by not taking medicine as directed, such as taking too much of a certain medicine.  What increases the risk? Certain factors can make you more likely to develop orthostatic hypotension, including:  Age. Risk increases as you get older.  Conditions that affect the heart or the central nervous system.  Taking certain medicines, such as blood pressure medicine or diuretics.  Being pregnant.  What are the signs or symptoms? Symptoms of this  condition may include:  Weakness.  Light-headedness.  Dizziness.  Blurred vision.  Fatigue.  Rapid heartbeat.  Fainting, in severe cases.  How is this diagnosed? This condition is diagnosed based on:  Your medical history.  Your symptoms.  Your blood pressure measurement. Your health care provider will check your blood pressure when you are: ? Lying down. ? Sitting. ? Standing.  A blood pressure reading is  recorded as two numbers, such as "120 over 80" (or 120/80). The first ("top") number is called the systolic pressure. It is a measure of the pressure in your arteries as your heart beats. The second ("bottom") number is called the diastolic pressure. It is a measure of the pressure in your arteries when your heart relaxes between beats. Blood pressure is measured in a unit called mm Hg. Healthy blood pressure for adults is 120/80. If your blood pressure is below 90/60, you may be diagnosed with hypotension. Other information or tests that may be used to diagnose orthostatic hypotension include:  Your other vital signs, such as your heart rate and temperature.  Blood tests.  Tilt table test. For this test, you will be safely secured to a table that moves you from a lying position to an upright position. Your heart rhythm and blood pressure will be monitored during the test.  How is this treated? Treatment for this condition may include:  Changing your diet. This may involve eating more salt (sodium) or drinking more water.  Taking medicines to raise your blood pressure.  Changing the dosage of certain medicines you are taking that might be lowering your blood pressure.  Wearing compression stockings. These stockings help to prevent blood clots and reduce swelling in your legs.  In some cases, you may need to go to the hospital for:  Fluid replacement. This means you will receive fluids through an IV tube.  Blood replacement. This means you will receive donated blood through an IV tube (transfusion).  Treating an infection or heart problems, if this applies.  Monitoring. You may need to be monitored while medicines that you are taking wear off.  Follow these instructions at home: Eating and drinking   Drink enough fluid to keep your urine clear or pale yellow.  Eat a healthy diet and follow instructions from your health care provider about eating or drinking restrictions. A healthy  diet includes: ? Fresh fruits and vegetables. ? Whole grains. ? Lean meats. ? Low-fat dairy products.  Eat extra salt only as directed. Do not add extra salt to your diet unless your health care provider told you to do that.  Eat frequent, small meals.  Avoid standing up suddenly after eating. Medicines  Take over-the-counter and prescription medicines only as told by your health care provider. ? Follow instructions from your health care provider about changing the dosage of your current medicines, if this applies. ? Do not stop or adjust any of your medicines on your own. General instructions  Wear compression stockings as told by your health care provider.  Get up slowly from lying down or sitting positions. This gives your blood pressure a chance to adjust.  Avoid hot showers and excessive heat as directed by your health care provider.  Return to your normal activities as told by your health care provider. Ask your health care provider what activities are safe for you.  Do not use any products that contain nicotine or tobacco, such as cigarettes and e-cigarettes. If you need help quitting, ask your health care  provider.  Keep all follow-up visits as told by your health care provider. This is important. Contact a health care provider if:  You vomit.  You have diarrhea.  You have a fever for more than 2-3 days.  You feel more thirsty than usual.  You feel weak and tired. Get help right away if:  You have chest pain.  You have a fast or irregular heartbeat.  You develop numbness in any part of your body.  You cannot move your arms or your legs.  You have trouble speaking.  You become sweaty or feel lightheaded.  You faint.  You feel short of breath.  You have trouble staying awake.  You feel confused. This information is not intended to replace advice given to you by your health care provider. Make sure you discuss any questions you have with your health  care provider. Document Released: 04/20/2002 Document Revised: 01/17/2016 Document Reviewed: 10/21/2015 Elsevier Interactive Patient Education  2018 [employer].  About Constipation  Constipation Overview Constipation is the most common gastrointestinal complaint - about 4 million Americans experience constipation and make 2.5 million physician visits a year to get help for the problem.  Constipation can occur when the colon absorbs too much water, the colon's muscle contraction is slow or sluggish, and/or there is delayed transit time through the colon.  The result is stool that is hard and dry.  Indicators of constipation include straining during bowel movements greater than 25% of the time, having fewer than three bowel movements per week, and/or the feeling of incomplete evacuation.  There are established guidelines ([place] II ) for defining constipation. A person needs to have two or more of the following symptoms for at least 12 weeks (not necessarily consecutive) in the preceding 12 months: . Straining in  greater than 25% of bowel movements . Lumpy or hard stools in greater than 25% of bowel movements . Sensation of incomplete emptying in greater than 25% of bowel movements . Sensation of anorectal obstruction/blockade in greater than 25% of bowel movements . Manual maneuvers to help empty greater than 25% of bowel movements (e.g., digital evacuation, support of the pelvic floor)  . Less than  3 bowel movements/week . Loose stools are not present, and criteria for irritable bowel syndrome are insufficient  Common Causes of Constipation . Lack of fiber in your diet . Lack of physical activity . Medications, including iron and calcium supplements  . Dairy intake . Dehydration . Abuse of laxatives  Travel  Irritable Bowel Syndrome  Pregnancy  Luteal phase of menstruation (after ovulation and before menses)  Colorectal problems  Intestinal Dysfunction  Treating  Constipation  There are several ways of treating constipation, including changes to diet and exercise, use of laxatives, adjustments to the pelvic floor, and scheduled toileting.  These treatments include: . increasing fiber and fluids in the diet  . increasing physical activity . learning muscle coordination   learning proper toileting techniques and toileting modifications   designing and sticking  to a toileting schedule     2007, [employer] Doc.22

## 2017-05-16 NOTE — Progress Notes (Signed)
Hospital follow up  Assessment and Plan: Hospital visit follow up for   Essential hypertension - continue medications, DASH diet, exercise and monitor at home. Call if greater than 130/80.  -     CBC with Differential/Platelet -     BASIC METABOLIC PANEL WITH GFR -     Hepatic function panel  Chronic kidney disease, stage 3 (HCC) Increase fluids, avoid NSAIDS, monitor sugars, will monitor -     [place] GFR  Fall, subsequent encounter Possible TIA, on ASA, statin Also has some hypotension/orthostatics, will change HCTZ to every other day and possibly will discontinue next OV, will bring in BP checks Compression stockings Increase water  Slurred speech Patient has mildly slurred speech at baseline, back to baseline.   Bipolar depression ([place]) Follow up with Dr. Buddy Duty, continue meds, ? Contributing to fall risk  Alcoholism ([place]) States she is not drinking at this time, will continue to monitor  Hypotension, unspecified hypotension type -     hydrochlorothiazide (MICROZIDE) 12.5 MG capsule; take 1 capsule by mouth EVERY OTHER DAY daily for blood pressure and FLUID - will changes to every other day Continue follow up with neuro monitor BP at home  Hospital discharge meds were reviewed, and reconciled with the patient.    Medications Discontinued During This Encounter  Medication Reason  . esomeprazole (NEXIUM) 40 MG capsule Completed Course    Over 40 minutes of exam, counseling, chart review, and complex, high/moderate level critical decision making was performed this visit.   Future Appointments  Date Time Provider [place]  07/05/2017 11:30 AM Penumalli, Earlean Polka, MD [facility] None  08/07/2017  3:45 PM Unk Pinto, MD [facility] None    HPI 72 y.o.female presents for follow up for transition from recent hospitalization or SNIF stay. Admit date to the hospital was 05/09/17, patient was discharged from the hospital on 05/11/17 and our  clinical staff contacted the office the day after discharge to set up a follow up appointment. The discharge summary, medications, and diagnostic test results were reviewed before meeting with the patient. The patient was admitted for:   A fall with slurred speech after, She was hypotensive when she went into the hospital. She had a normal CT head, MRI head, had CTA that showed no significant stenosis, echo EF 60-65%, she is on statin and ASA 325 and needs follow up with neurology 02/22.  She states she continues to fall, has been less since she left the hospital. She has had ringing in her ears.  She is on HCTZ and ziac. Her BP today is 128/82 but with standing dropped to  110/70.  [name] is helping with meds ans states she has been drinking "energy drinks" patient states he has not or just a mellow yellow.  She lost her daughter and grandson recently due to drug overdose, follows with Dr. Buddy Duty, does not have follow up until Feb.   She also complains of bilateral ringing/fluid in her ears, worse in the right ear. Some sinus issues. Tried benadryl but it was too sedating.   Home health is involved.   Images while in the hospital: Dg Elbow 2 Views Left  Result Date: 05/09/2017 CLINICAL DATA:  Left elbow pain EXAM: LEFT ELBOW - 2 VIEW COMPARISON:  None. FINDINGS: No fracture or dislocation. No large elbow effusion. Soft tissues are unremarkable IMPRESSION: No acute osseous abnormality Electronically Signed   By: Donavan Foil M.D.   On: 05/09/2017 22:41   Ct Head Wo  Contrast  Result Date: 05/09/2017 CLINICAL DATA:  72 year old female with unwitnessed fall. EXAM: CT HEAD WITHOUT CONTRAST CT CERVICAL SPINE WITHOUT CONTRAST TECHNIQUE: Multidetector CT imaging of the head and cervical spine was performed following the standard protocol without intravenous contrast. Multiplanar CT image reconstructions of the cervical spine were also generated. COMPARISON:  Head CT dated 11/05/2015 FINDINGS: CT HEAD  FINDINGS Brain: Mild age-related atrophy and chronic microvascular ischemic changes. There is no acute intracranial hemorrhage. No mass effect or midline shift. No extra-axial fluid collection. Vascular: No hyperdense vessel or unexpected calcification. Skull: Normal. Negative for fracture or focal lesion. Sinuses/Orbits: No acute finding. Other: None. CT CERVICAL SPINE FINDINGS Alignment: No acute subluxation. Skull base and vertebrae: No acute fracture.  Osteopenia. Soft tissues and spinal canal: No prevertebral fluid or swelling. No visible canal hematoma. Disc levels: Multilevel degenerative changes and disc disease. Multilevel endplate irregularity and disc space narrowing. Upper chest: Atherosclerotic calcification of the aortic arch. Other: Bilateral carotid bulb calcified plaques. IMPRESSION: 1. No acute intracranial hemorrhage. 2. Mild age-related atrophy and chronic microvascular ischemic changes. 3. No acute/traumatic cervical spine pathology. Multilevel degenerative changes. Electronically Signed   By: Anner Crete M.D.   On: 05/09/2017 22:04   Ct Cervical Spine Wo Contrast  Result Date: 05/09/2017 CLINICAL DATA:  72 year old female with unwitnessed fall. EXAM: CT HEAD WITHOUT CONTRAST CT CERVICAL SPINE WITHOUT CONTRAST TECHNIQUE: Multidetector CT imaging of the head and cervical spine was performed following the standard protocol without intravenous contrast. Multiplanar CT image reconstructions of the cervical spine were also generated. COMPARISON:  Head CT dated 11/05/2015 FINDINGS: CT HEAD FINDINGS Brain: Mild age-related atrophy and chronic microvascular ischemic changes. There is no acute intracranial hemorrhage. No mass effect or midline shift. No extra-axial fluid collection. Vascular: No hyperdense vessel or unexpected calcification. Skull: Normal. Negative for fracture or focal lesion. Sinuses/Orbits: No acute finding. Other: None. CT CERVICAL SPINE FINDINGS Alignment: No acute  subluxation. Skull base and vertebrae: No acute fracture.  Osteopenia. Soft tissues and spinal canal: No prevertebral fluid or swelling. No visible canal hematoma. Disc levels: Multilevel degenerative changes and disc disease. Multilevel endplate irregularity and disc space narrowing. Upper chest: Atherosclerotic calcification of the aortic arch. Other: Bilateral carotid bulb calcified plaques. IMPRESSION: 1. No acute intracranial hemorrhage. 2. Mild age-related atrophy and chronic microvascular ischemic changes. 3. No acute/traumatic cervical spine pathology. Multilevel degenerative changes. Electronically Signed   By: Anner Crete M.D.   On: 05/09/2017 22:04   Mr Brain Wo Contrast  Result Date: 05/10/2017 CLINICAL DATA:  [name] on 05/09/2017.  Slurred speech. EXAM: MRI HEAD WITHOUT CONTRAST TECHNIQUE: Multiplanar, multiecho pulse sequences of the brain and surrounding structures were obtained without intravenous contrast. COMPARISON:  CT 05/09/2017. FINDINGS: Brain: Diffusion imaging does not show any acute or subacute infarction. There is mild age related volume loss without evidence of any old or acute small or large vessel ischemic changes. No mass lesion, hemorrhage, hydrocephalus or extra-axial collection. No pituitary mass. Vascular: Major vessels at the base of the brain show flow. Skull and upper cervical spine: Negative Sinuses/Orbits: Clear/normal Other: None IMPRESSION: Mild age related volume loss. No acute or focal finding. No traumatic finding. Electronically Signed   By: Nelson Chimes M.D.   On: 05/10/2017 08:34    Past Medical History:  Diagnosis Date  . Alcoholism ([place])   . Anxiety   . Depression   . Elevated hemoglobin A1c   . Fracture of right wrist   . GERD (gastroesophageal reflux disease)   .  Heart murmur   . Hyperlipidemia   . Hypertension   . IBS (irritable bowel syndrome)      Allergies  Allergen Reactions  . Lipitor [Atorvastatin]     Fatigue  . Prednisone Other  (See Comments)    Change in mental status      Current Outpatient Medications on File Prior to Visit  Medication Sig Dispense Refill  . ALPRAZolam (XANAX) 1 MG tablet Take 1 mg by mouth 3 (three) times daily as needed for anxiety. For anxiety.     Marland Kitchen aspirin EC 325 MG EC tablet Take 1 tablet (325 mg total) by mouth daily. 30 tablet 0  . bisoprolol-hydrochlorothiazide (ZIAC) 5-6.25 MG tablet take 1 tablet by mouth once daily 90 tablet 1  . buPROPion (WELLBUTRIN XL) 150 MG 24 hr tablet Take 450 mg by mouth daily.    . Cholecalciferol (VITAMIN D3) 5000 UNITS CAPS Take 5,000 Units by mouth daily.     Marland Kitchen gabapentin (NEURONTIN) 600 MG tablet Take 300 mg by mouth at bedtime.   0  . hydrochlorothiazide (MICROZIDE) 12.5 MG capsule take 1 capsule by mouth once daily for blood pressure and FLUID 90 capsule 1  . lamoTRIgine (LAMICTAL) 200 MG tablet Take 400 mg by mouth daily.   0  . rosuvastatin (CRESTOR) 40 MG tablet take 1/2 to 1 tablet by mouth once daily (Patient taking differently: take 20 MG by mouth once daily) 90 tablet 1  . sertraline (ZOLOFT) 100 MG tablet Take 200 mg by mouth daily.   0   No current facility-administered medications on file prior to visit.     ROS: all negative except above.   Physical Exam: Filed Weights   05/16/17 1352  Weight: 143 lb 3.2 oz (65 kg)   BP 128/82   Pulse 66   Temp (!) 97.5 F (36.4 C)   Resp 16   Ht 5' 4.5" (1.638 m)   Wt 143 lb 3.2 oz (65 kg)   SpO2 95%   BMI 24.20 kg/m  General Appearance: Well nourished well developed, in no apparent distress. Eyes: PERRLA, EOMs, conjunctiva no swelling or erythema ENT/Mouth: Ear canals normal without obstruction, swelling, erythma, discharge.  TMs normal bilaterally.  Oropharynx moist, clear, without exudate, or postoropharyngeal swelling. Neck: Supple, thyroid normal,no cervical adenopathy  Respiratory: Respiratory effort normal, Breath sounds clear A&P without rhonchi, wheeze, or rale.  No retractions, no  accessory usage. Cardio: RRR with no MRGs. Brisk peripheral pulses without edema.  Abdomen: Soft, + BS,  Non tender, no guarding, rebound, hernias, masses. Musculoskeletal: Full ROM, 5/5 strength, Normal unsteady Skin: Warm, dry without rashes, lesions, ecchymosis.  Neuro: Awake and oriented X 3, Cranial nerves intact. Normal muscle tone, no cerebellar symptoms, + tremor unchanged Psych: Weepy with depressed affect,  Mildly slurred speech which is patients baseline, Insight and Judgment poor.      Vicie Mutters, PA-C 2:04 PM [facility]

## 2017-05-17 DIAGNOSIS — K219 Gastro-esophageal reflux disease without esophagitis: Secondary | ICD-10-CM | POA: Diagnosis not present

## 2017-05-17 DIAGNOSIS — I129 Hypertensive chronic kidney disease with stage 1 through stage 4 chronic kidney disease, or unspecified chronic kidney disease: Secondary | ICD-10-CM | POA: Diagnosis not present

## 2017-05-17 DIAGNOSIS — E559 Vitamin D deficiency, unspecified: Secondary | ICD-10-CM | POA: Diagnosis not present

## 2017-05-17 DIAGNOSIS — R4781 Slurred speech: Secondary | ICD-10-CM | POA: Diagnosis not present

## 2017-05-17 DIAGNOSIS — S0990XD Unspecified injury of head, subsequent encounter: Secondary | ICD-10-CM | POA: Diagnosis not present

## 2017-05-17 DIAGNOSIS — E785 Hyperlipidemia, unspecified: Secondary | ICD-10-CM | POA: Diagnosis not present

## 2017-05-17 DIAGNOSIS — S51002D Unspecified open wound of left elbow, subsequent encounter: Secondary | ICD-10-CM | POA: Diagnosis not present

## 2017-05-17 DIAGNOSIS — N3281 Overactive bladder: Secondary | ICD-10-CM | POA: Diagnosis not present

## 2017-05-17 DIAGNOSIS — K589 Irritable bowel syndrome without diarrhea: Secondary | ICD-10-CM | POA: Diagnosis not present

## 2017-05-17 DIAGNOSIS — N183 Chronic kidney disease, stage 3 (moderate): Secondary | ICD-10-CM | POA: Diagnosis not present

## 2017-05-17 LAB — CBC WITH DIFFERENTIAL/PLATELET
BASOS PCT: 0.8 %
Basophils Absolute: 59 cells/uL (ref 0–200)
EOS ABS: 178 {cells}/uL (ref 15–500)
EOS PCT: 2.4 %
HCT: 34.8 % — ABNORMAL LOW (ref 35.0–45.0)
HEMOGLOBIN: 11.8 g/dL (ref 11.7–15.5)
Lymphs Abs: 1147 cells/uL (ref 850–3900)
MCH: 30.3 pg (ref 27.0–33.0)
MCHC: 33.9 g/dL (ref 32.0–36.0)
MCV: 89.5 fL (ref 80.0–100.0)
MONOS PCT: 7.1 %
MPV: 11.3 fL (ref 7.5–12.5)
NEUTROS ABS: 5491 {cells}/uL (ref 1500–7800)
Neutrophils Relative %: 74.2 %
Platelets: 229 10*3/uL (ref 140–400)
RBC: 3.89 10*6/uL (ref 3.80–5.10)
RDW: 12.7 % (ref 11.0–15.0)
Total Lymphocyte: 15.5 %
WBC mixed population: 525 cells/uL (ref 200–950)
WBC: 7.4 10*3/uL (ref 3.8–10.8)

## 2017-05-17 LAB — BASIC METABOLIC PANEL WITH GFR
BUN / CREAT RATIO: 20 (calc) (ref 6–22)
BUN: 19 mg/dL (ref 7–25)
CO2: 30 mmol/L (ref 20–32)
Calcium: 9.7 mg/dL (ref 8.6–10.4)
Chloride: 102 mmol/L (ref 98–110)
Creat: 0.96 mg/dL — ABNORMAL HIGH (ref 0.60–0.93)
GFR, EST NON AFRICAN AMERICAN: 59 mL/min/{1.73_m2} — AB (ref >=60)
GFR, Est African American: 68 mL/min/{1.73_m2} (ref >=60)
Glucose, Bld: 89 mg/dL (ref 65–99)
Potassium: 4 mmol/L (ref 3.5–5.3)
SODIUM: 142 mmol/L (ref 135–146)

## 2017-05-17 LAB — HEPATIC FUNCTION PANEL
AG RATIO: 2 (calc) (ref 1.0–2.5)
ALBUMIN MSPROF: 4.2 g/dL (ref 3.6–5.1)
ALT: 12 U/L (ref 6–29)
AST: 17 U/L (ref 10–35)
Alkaline phosphatase (APISO): 59 U/L (ref 33–130)
BILIRUBIN DIRECT: 0.1 mg/dL (ref 0.0–0.2)
BILIRUBIN INDIRECT: 0.4 mg/dL (ref 0.2–1.2)
Globulin: 2.1 g/dL (calc) (ref 1.9–3.7)
Total Bilirubin: 0.5 mg/dL (ref 0.2–1.2)
Total Protein: 6.3 g/dL (ref 6.1–8.1)

## 2017-06-13 ENCOUNTER — Encounter: Payer: Self-pay | Admitting: Internal Medicine

## 2017-06-18 ENCOUNTER — Telehealth: Payer: Self-pay | Admitting: Physician Assistant

## 2017-06-18 DIAGNOSIS — F319 Bipolar disorder, unspecified: Secondary | ICD-10-CM

## 2017-06-18 DIAGNOSIS — W19XXXD Unspecified fall, subsequent encounter: Secondary | ICD-10-CM

## 2017-06-18 DIAGNOSIS — G459 Transient cerebral ischemic attack, unspecified: Secondary | ICD-10-CM

## 2017-06-18 DIAGNOSIS — F1020 Alcohol dependence, uncomplicated: Secondary | ICD-10-CM

## 2017-06-18 DIAGNOSIS — R4781 Slurred speech: Secondary | ICD-10-CM

## 2017-06-18 HISTORY — DX: Transient cerebral ischemic attack, unspecified: G45.9

## 2017-06-18 NOTE — Addendum Note (Signed)
Addended by: Vicie Mutters R on: 06/18/2017 10:56 AM   Modules accepted: Orders

## 2017-06-18 NOTE — Telephone Encounter (Signed)
-----   Message from Elenor Quinones, [place] sent at 06/17/2017  3:22 PM EST ----- Regarding: concerns Contact: [phone] Just a reminder   Doristine Counter of [facility]         Reports that Alinda is still seeing things & she([name]) feels that Ursula maybe still taking too much meds. She would like [place] to maybe go to [place].   Side Note: you mentioned NEURO referral..Marland KitchenMarland Kitchen?

## 2017-06-18 NOTE — Telephone Encounter (Signed)
Patient with long standing history of alcoholism, bipolar depression, with recent admission for possible TIA for slurred speech, falls. Will refer patient to neurology and neuropsych for evaluation, but if any hallucinations, thought of harming herself or anyone else, weakness, worsening confusion, severe headache go to the ER.

## 2017-06-25 NOTE — Telephone Encounter (Signed)
Spoke with Mrs. Tina Griffiths: Tina Griffiths states Angelica Kelley has an appt with Neurology on Feb 22nd & Feb 12th 2019 w/ Psych to go over her meds.

## 2017-07-01 ENCOUNTER — Emergency Department (HOSPITAL_COMMUNITY): Payer: Medicare Other

## 2017-07-01 ENCOUNTER — Emergency Department (HOSPITAL_COMMUNITY)
Admission: EM | Admit: 2017-07-01 | Discharge: 2017-07-02 | Disposition: A | Discharge status: Home / Self Care | Payer: Medicare Other | Attending: Emergency Medicine | Admitting: Emergency Medicine

## 2017-07-01 ENCOUNTER — Encounter (HOSPITAL_COMMUNITY): Payer: Self-pay

## 2017-07-01 DIAGNOSIS — Y929 Unspecified place or not applicable: Secondary | ICD-10-CM | POA: Insufficient documentation

## 2017-07-01 DIAGNOSIS — S299XXA Unspecified injury of thorax, initial encounter: Secondary | ICD-10-CM | POA: Diagnosis not present

## 2017-07-01 DIAGNOSIS — R410 Disorientation, unspecified: Secondary | ICD-10-CM

## 2017-07-01 DIAGNOSIS — I129 Hypertensive chronic kidney disease with stage 1 through stage 4 chronic kidney disease, or unspecified chronic kidney disease: Secondary | ICD-10-CM | POA: Diagnosis not present

## 2017-07-01 DIAGNOSIS — Y999 Unspecified external cause status: Secondary | ICD-10-CM | POA: Diagnosis not present

## 2017-07-01 DIAGNOSIS — W1830XA Fall on same level, unspecified, initial encounter: Secondary | ICD-10-CM | POA: Diagnosis not present

## 2017-07-01 DIAGNOSIS — Z8673 Personal history of transient ischemic attack (TIA), and cerebral infarction without residual deficits: Secondary | ICD-10-CM | POA: Insufficient documentation

## 2017-07-01 DIAGNOSIS — Z79899 Other long term (current) drug therapy: Secondary | ICD-10-CM | POA: Insufficient documentation

## 2017-07-01 DIAGNOSIS — Z7982 Long term (current) use of aspirin: Secondary | ICD-10-CM | POA: Diagnosis not present

## 2017-07-01 DIAGNOSIS — Y939 Activity, unspecified: Secondary | ICD-10-CM | POA: Diagnosis not present

## 2017-07-01 DIAGNOSIS — N183 Chronic kidney disease, stage 3 (moderate): Secondary | ICD-10-CM | POA: Insufficient documentation

## 2017-07-01 DIAGNOSIS — R402441 Other coma, without documented Glasgow coma scale score, or with partial score reported, in the field [EMT or ambulance]: Secondary | ICD-10-CM | POA: Diagnosis not present

## 2017-07-01 DIAGNOSIS — R140 Abdominal distension (gaseous): Secondary | ICD-10-CM | POA: Diagnosis not present

## 2017-07-01 DIAGNOSIS — R55 Syncope and collapse: Secondary | ICD-10-CM | POA: Diagnosis not present

## 2017-07-01 DIAGNOSIS — W19XXXA Unspecified fall, initial encounter: Secondary | ICD-10-CM

## 2017-07-01 DIAGNOSIS — S199XXA Unspecified injury of neck, initial encounter: Secondary | ICD-10-CM | POA: Diagnosis not present

## 2017-07-01 DIAGNOSIS — R4182 Altered mental status, unspecified: Secondary | ICD-10-CM | POA: Diagnosis present

## 2017-07-01 LAB — URINALYSIS, ROUTINE W REFLEX MICROSCOPIC
BILIRUBIN URINE: NEGATIVE
Glucose, UA: NEGATIVE mg/dL
HGB URINE DIPSTICK: NEGATIVE
KETONES UR: NEGATIVE mg/dL
Leukocytes, UA: NEGATIVE
NITRITE: NEGATIVE
Protein, ur: NEGATIVE mg/dL
SPECIFIC GRAVITY, URINE: 1.017 (ref 1.005–1.030)
pH: 5 (ref 5.0–8.0)

## 2017-07-01 LAB — COMPREHENSIVE METABOLIC PANEL
ALK PHOS: 54 U/L (ref 38–126)
ALT: 9 U/L — AB (ref 14–54)
AST: 19 U/L (ref 15–41)
Albumin: 4 g/dL (ref 3.5–5.0)
Anion gap: 13 (ref 5–15)
BILIRUBIN TOTAL: 0.7 mg/dL (ref 0.3–1.2)
BUN: 28 mg/dL — AB (ref 6–20)
CALCIUM: 9.5 mg/dL (ref 8.9–10.3)
CHLORIDE: 104 mmol/L (ref 101–111)
CO2: 23 mmol/L (ref 22–32)
CREATININE: 1.18 mg/dL — AB (ref 0.44–1.00)
GFR, EST AFRICAN AMERICAN: 52 mL/min — AB (ref >60)
GFR, EST NON AFRICAN AMERICAN: 45 mL/min — AB (ref >60)
Glucose, Bld: 85 mg/dL (ref 65–99)
Potassium: 3.9 mmol/L (ref 3.5–5.1)
Sodium: 140 mmol/L (ref 135–145)
Total Protein: 6 g/dL — ABNORMAL LOW (ref 6.5–8.1)

## 2017-07-01 LAB — CBC WITH DIFFERENTIAL/PLATELET
Basophils Absolute: 0 10*3/uL (ref 0.0–0.1)
Basophils Relative: 1 %
EOS PCT: 2 %
Eosinophils Absolute: 0.1 10*3/uL (ref 0.0–0.7)
HEMATOCRIT: 37 % (ref 36.0–46.0)
HEMOGLOBIN: 11.8 g/dL — AB (ref 12.0–15.0)
LYMPHS ABS: 1 10*3/uL (ref 0.7–4.0)
LYMPHS PCT: 19 %
MCH: 29.6 pg (ref 26.0–34.0)
MCHC: 31.9 g/dL (ref 30.0–36.0)
MCV: 93 fL (ref 78.0–100.0)
Monocytes Absolute: 0.4 10*3/uL (ref 0.1–1.0)
Monocytes Relative: 8 %
NEUTROS ABS: 3.8 10*3/uL (ref 1.7–7.7)
NEUTROS PCT: 70 %
Platelets: 200 10*3/uL (ref 150–400)
RBC: 3.98 MIL/uL (ref 3.87–5.11)
RDW: 13.3 % (ref 11.5–15.5)
WBC: 5.4 10*3/uL (ref 4.0–10.5)

## 2017-07-01 LAB — ACETAMINOPHEN LEVEL: Acetaminophen (Tylenol), Serum: <10 ug/mL — ABNORMAL LOW (ref 10–30)

## 2017-07-01 LAB — I-STAT TROPONIN, ED: Troponin i, poc: 0 ng/mL (ref 0.00–0.08)

## 2017-07-01 LAB — CK: Total CK: 49 U/L (ref 38–234)

## 2017-07-01 LAB — I-STAT CG4 LACTIC ACID, ED: Lactic Acid, Venous: 0.65 mmol/L (ref 0.5–1.9)

## 2017-07-01 LAB — SALICYLATE LEVEL

## 2017-07-01 MED ORDER — SODIUM CHLORIDE 0.9 % IV BOLUS (SEPSIS)
1000.0000 mL | Freq: Once | INTRAVENOUS | Status: AC
  Administered 2017-07-01: 1000 mL via INTRAVENOUS

## 2017-07-01 NOTE — Progress Notes (Addendum)
CSW and CM met with pt. Pt informed CSW and CM that she drives to the grocery store and feeds herself via fast food and such. Pt is agreeable to home health via [facility]. Pt remembered she used [facility] in the past.   CSW started APS report with [facility] due to concerns about pt's home safety.   CSW completed APS report.  CSW signing off.   Wendelyn Breslow, [facility]  [phone]

## 2017-07-01 NOTE — ED Triage Notes (Signed)
Pt bib [facility] from home due to a fall. Pt has been having ams going down for about a month. Pt was found on the floor by family pt was last seen by family two days ago. Pt has no c/o pain, is able to follow all commands. Pt is axo x2.

## 2017-07-01 NOTE — ED Provider Notes (Signed)
[facility] EMERGENCY DEPARTMENT Provider Note   CSN: [id number] Arrival date & time: 07/01/17  1807     History   Chief Complaint Chief Complaint  Patient presents with  . Fall  . Altered Mental Status    HPI Angelica Kelley is a 72 y.o. female.  HPI Angelica Kelley is a 72 y.o. female with history of anxiety, depression, alcohol abuse, hypertension, presents to emergency department with altered mental status and a fall.  Patient was found on the floor this morning by her family.  She was last seen normal 2 days ago.  Patient apparently has had cognitive decline over the last several weeks.  She apparently has a follow-up appointment with neurology for this reason, family think that she may have dementia.  They state that the last several days, her symptoms have significantly gotten worse.  States she fell when she got up from the couch and was walking in her house.  She is unsure what made her fall.  She denies hitting her head no loss of consciousness.  She was unable to get up on her own.  She states the fall happened this morning.  She denies any pain at this time.  She does have some bruising to her extremities.  She has no complaints  Past Medical History:  Diagnosis Date  . Alcoholism ([place])   . Anxiety   . Depression   . Elevated hemoglobin A1c   . Fracture of right wrist   . GERD (gastroesophageal reflux disease)   . Heart murmur   . Hyperlipidemia   . Hypertension   . IBS (irritable bowel syndrome)     Patient Active Problem List   Diagnosis Date Noted  . TIA (transient ischemic attack) 06/18/2017  . Depression with anxiety 05/09/2017  . Fall 05/09/2017  . Slurred speech 05/09/2017  . Acute renal failure superimposed on stage 2 chronic kidney disease ([place]) 05/09/2017  . Chronic kidney disease, stage 3 ([place]) 04/23/2017  . OAB (overactive bladder) 06/09/2014  . Vitamin D deficiency 10/27/2013  . Medication management 10/27/2013  . Hypertension     . Hyperlipidemia, mixed   . GERD (gastroesophageal reflux disease)   . Bipolar depression ([place])   . Alcoholism ([place])   . IBS (irritable bowel syndrome)   . Other abnormal glucose     Past Surgical History:  Procedure Laterality Date  . ABDOMINAL HYSTERECTOMY  1991  . APPENDECTOMY    . BREAST SURGERY Left 1989   [facility]  . CARPAL TUNNEL RELEASE Right 10/18/2014   Procedure: CARPAL TUNNEL RELEASE;  Surgeon: Dorna Leitz, MD;  Location: [facility];  Service: Orthopedics;  Laterality: Right;  . [place]  . NECK SURGERY  Remote    Ruptured disc, per patient. Got infected.   . ORIF WRIST FRACTURE Right 10/18/2014   Procedure: OPEN REDUCTION INTERNAL FIXATION (ORIF) RIGHT WRIST FRACTURE;  Surgeon: Dorna Leitz, MD;  Location: [facility];  Service: Orthopedics;  Laterality: Right;  . TONSILLECTOMY  1965  . TUBAL LIGATION      OB History    No data available       Home Medications    Prior to Admission medications   Medication Sig Start Date End Date Taking? Authorizing Provider  ALPRAZolam Duanne Moron) 1 MG tablet Take 1 mg by mouth 3 (three) times daily as needed for anxiety. For anxiety.     [provider]  aspirin EC 325 MG EC tablet Take 1 tablet (325  mg total) by mouth daily. 05/12/17   Cristal Ford, DO  bisoprolol-hydrochlorothiazide [facility]) 5-6.25 MG tablet take 1 tablet by mouth once daily 03/13/17   Unk Pinto, MD  buPROPion (WELLBUTRIN XL) 150 MG 24 hr tablet Take 450 mg by mouth daily.    [provider]  Cholecalciferol (VITAMIN D3) 5000 UNITS CAPS Take 5,000 Units by mouth daily.     [provider]  gabapentin (NEURONTIN) 600 MG tablet Take 300 mg by mouth at bedtime.  10/26/16   [provider]  hydrochlorothiazide (MICROZIDE) 12.5 MG capsule take 1 capsule by mouth EVERY OTHER DAY daily for blood pressure and FLUID 05/16/17   Vicie Mutters, PA-C  lamoTRIgine (LAMICTAL) 200 MG tablet Take 400 mg by mouth daily.  04/21/15    [provider]  rosuvastatin (CRESTOR) 40 MG tablet take 1/2 to 1 tablet by mouth once daily Patient taking differently: take 20 MG by mouth once daily 03/13/17   Unk Pinto, MD  sertraline (ZOLOFT) 100 MG tablet Take 200 mg by mouth daily.  04/11/15   [provider]    Family History Family History  Problem Relation Age of Onset  . Heart disease Mother   . Diabetes Mother   . Leukemia Father   . Heart disease Brother   . Cancer Brother   . Parkinson's disease Brother     Social History Social History   Tobacco Use  . Smoking status: Never Smoker  . Smokeless tobacco: Never Used  Substance Use Topics  . Alcohol use: No  . Drug use: No     Allergies   Lipitor [atorvastatin] and Prednisone   Review of Systems Review of Systems  Unable to perform ROS: Mental status change     Physical Exam Updated Vital Signs BP 126/67 (BP Location: Right Arm)   Pulse 61   Resp 16   Ht 5\' 1"  (1.549 m)   Wt 65.8 kg (145 lb)   SpO2 100%   BMI 27.40 kg/m   Physical Exam  Constitutional: She appears well-developed and well-nourished. No distress.  HENT:  Head: Normocephalic.  Eyes: Conjunctivae are normal.  Neck: Neck supple.  No midline cervical spine tenderness.  Cardiovascular: Normal rate, regular rhythm and normal heart sounds.  Pulmonary/Chest: Effort normal and breath sounds normal. No respiratory distress. She has no wheezes. She has no rales.  Abdominal: Soft. Bowel sounds are normal. She exhibits no distension. There is no tenderness. There is no rebound.  Musculoskeletal: She exhibits no edema.  Neurological: She is alert.  Oriented to self only.  Having some speech difficulty and difficulty with word finding and naming objects.  Otherwise cranial nerves are intact, 5 out of 5 and equal strength of upper and lower extremities.  Normal finger to nose and heel to shin.  Skin: Skin is warm and dry.  Psychiatric: She has a normal mood and  affect. Her behavior is normal.  Nursing note and vitals reviewed.    ED Treatments / Results  Labs (all labs ordered are listed, but only abnormal results are displayed) Labs Reviewed  CBC WITH DIFFERENTIAL/PLATELET - Abnormal; Notable for the following components:      Result Value   Hemoglobin 11.8 (*)    All other components within normal limits  COMPREHENSIVE METABOLIC PANEL - Abnormal; Notable for the following components:   BUN 28 (*)    Creatinine, Ser 1.18 (*)    Total Protein 6.0 (*)    ALT 9 (*)  GFR calc non Af Amer 45 (*)    GFR calc Af Amer 52 (*)    All other components within normal limits  ACETAMINOPHEN LEVEL - Abnormal; Notable for the following components:   Acetaminophen (Tylenol), Serum <10 (*)    All other components within normal limits  URINALYSIS, ROUTINE W REFLEX MICROSCOPIC  CK  SALICYLATE LEVEL  I-STAT TROPONIN, ED  I-STAT CG4 LACTIC ACID, ED  I-STAT CG4 LACTIC ACID, ED    EKG  EKG Interpretation None       Radiology Dg Chest 2 View  Result Date: 07/01/2017 CLINICAL DATA:  Fall. Altered mental status. Found on the floor by family. EXAM: CHEST  2 VIEW COMPARISON:  11/05/2015 FINDINGS: The heart is mildly enlarged. There are no focal consolidations or pleural effusions. No pulmonary edema. No pneumothorax or acute fracture. There is sclerosis of the left humeral head. Degenerative changes in the glenohumeral joint. IMPRESSION: 1. Cardiomegaly. 2.  No evidence for acute pulmonary abnormality. Electronically Signed   By: Nolon Nations M.D.   On: 07/01/2017 19:43   Ct Head Wo Contrast  Result Date: 07/01/2017 CLINICAL DATA:  Found down. EXAM: CT HEAD WITHOUT CONTRAST CT CERVICAL SPINE WITHOUT CONTRAST TECHNIQUE: Multidetector CT imaging of the head and cervical spine was performed following the standard protocol without intravenous contrast. Multiplanar CT image reconstructions of the cervical spine were also generated. COMPARISON:  05/11/2017  FINDINGS: CT HEAD FINDINGS Brain: Extra-axial fluid collection on the left side has the appearance of a subdural hygroma but was not present on the prior study from 6 weeks ago. This was likely a subdural hematoma that has liquified. I do not see any residual blood products at this point and there is no evidence of acute hemorrhage. There is a few mm of left-to-right shift. No findings for hemispheric infarction or intracranial hemorrhage. No mass lesions. The brainstem and cerebellum are grossly normal in stable. Extra-axial fluid around the cerebellum is a stable finding and likely due to mild cerebellar atrophy. Vascular: No hyperdense vessels or aneurysm. Skull: No skull fracture is identified. Sinuses/Orbits: No acute sinus disease. The mastoid air cells and middle ear cavities are clear. Other: No scalp lesion or hematoma. CT CERVICAL SPINE FINDINGS Alignment: Degenerative cervical spondylosis but the overall alignment is maintained. Skull base and vertebrae: No acute fractures identified. Multilevel disc disease and facet disease. Soft tissues and spinal canal: No prevertebral fluid or swelling. No visible canal hematoma. Disc levels: No obvious large disc protrusions or spinal stenosis. Mild to moderate multilevel foraminal stenosis due to uncinate spurring and facet disease. Upper chest: The lung apices are grossly clear. Other: No neck mass or hematoma. IMPRESSION: 1. Left-sided extra-axial fluid likely residual from prior subdural hematoma sometime after the last head CT. No acute hemorrhage. 3.5 mm of midline shift. 2. No findings for hemispheric infarction or hemorrhage. 3. Stable cerebellar atrophy. 4. No skull fracture. Electronically Signed   By: Marijo Sanes M.D.   On: 07/01/2017 19:40   Ct Cervical Spine Wo Contrast  Result Date: 07/01/2017 CLINICAL DATA:  Found down. EXAM: CT HEAD WITHOUT CONTRAST CT CERVICAL SPINE WITHOUT CONTRAST TECHNIQUE: Multidetector CT imaging of the head and cervical  spine was performed following the standard protocol without intravenous contrast. Multiplanar CT image reconstructions of the cervical spine were also generated. COMPARISON:  05/11/2017 FINDINGS: CT HEAD FINDINGS Brain: Extra-axial fluid collection on the left side has the appearance of a subdural hygroma but was not present on the prior study from  6 weeks ago. This was likely a subdural hematoma that has liquified. I do not see any residual blood products at this point and there is no evidence of acute hemorrhage. There is a few mm of left-to-right shift. No findings for hemispheric infarction or intracranial hemorrhage. No mass lesions. The brainstem and cerebellum are grossly normal in stable. Extra-axial fluid around the cerebellum is a stable finding and likely due to mild cerebellar atrophy. Vascular: No hyperdense vessels or aneurysm. Skull: No skull fracture is identified. Sinuses/Orbits: No acute sinus disease. The mastoid air cells and middle ear cavities are clear. Other: No scalp lesion or hematoma. CT CERVICAL SPINE FINDINGS Alignment: Degenerative cervical spondylosis but the overall alignment is maintained. Skull base and vertebrae: No acute fractures identified. Multilevel disc disease and facet disease. Soft tissues and spinal canal: No prevertebral fluid or swelling. No visible canal hematoma. Disc levels: No obvious large disc protrusions or spinal stenosis. Mild to moderate multilevel foraminal stenosis due to uncinate spurring and facet disease. Upper chest: The lung apices are grossly clear. Other: No neck mass or hematoma. IMPRESSION: 1. Left-sided extra-axial fluid likely residual from prior subdural hematoma sometime after the last head CT. No acute hemorrhage. 3.5 mm of midline shift. 2. No findings for hemispheric infarction or hemorrhage. 3. Stable cerebellar atrophy. 4. No skull fracture. Electronically Signed   By: Marijo Sanes M.D.   On: 07/01/2017 19:40    Procedures Procedures  (including critical care time)  Medications Ordered in ED Medications  sodium chloride 0.9 % bolus 1,000 mL (1,000 mLs Intravenous New Bag/Given 07/01/17 1835)     Initial Impression / Assessment and Plan / ED Course  I have reviewed the triage vital signs and the nursing notes.  Pertinent labs & imaging results that were available during my care of the patient were reviewed by me and considered in my medical decision making (see chart for details).    Department with altered mental status in the fall.  She is alert, oriented to self only.  She is having some speech problems and difficulty with word finding.  Will get CT head and cervical spine due to altered mental status and trauma.  Will check labs, urinalysis, EKG.   I spoke with patient's niece [name], who comes over to patient's house twice a week and helps her organize her medications.  [name] tells me that she is concerned that patient may be taking too high of overdose of over-the-counter medications.  She has found several bottles around the house of "back and pain" pills and some allergy pills.  She also explained that she no longer is able to care for her aunt.  She would like to see if we can place her into a rehab or skilled nursing facility.  I explained to her that were still running some tests at this time and we will keep them up-to-date.  She would also like [place] to update her brother, Corena Herter, his number is [phone].   I spoke with Mariann Laster with case management, explained to her the situation and that patient may need placement.  We are obtaining MRI to rule out an acute stroke as the cause of her worsening confusion.  Her labs and urine analysis so far are unremarkable.  12:04 AM Patient pending MRI of the brain.  Spoke with Mariann Laster, with case management, no way to place patient from the emergency room.  If MRI is negative, patient is okay to be discharged home, home health has been ordered.  [facility] has also been contacted  and will come out to the house to check on the patient.  Singed out at shift change pending MR brain  Vitals:   07/01/17 2015 07/01/17 2030 07/01/17 2045 07/01/17 2100  BP: 129/75 135/77 (!) 135/120 127/71  Pulse: 64 73 71 63  Resp: 14 16 (!) 21 18  SpO2: 100% 99% (!) 88% 100%  Weight:      Height:         Final Clinical Impressions(s) / ED Diagnoses   Final diagnoses:  None    ED Discharge Orders    None       Jeannett Senior, PA-C 07/02/17 0038    Julianne Rice, MD 07/04/17 1028

## 2017-07-02 ENCOUNTER — Emergency Department (HOSPITAL_COMMUNITY): Payer: Medicare Other

## 2017-07-02 MED ORDER — LORAZEPAM 2 MG/ML IJ SOLN
0.5000 mg | Freq: Once | INTRAMUSCULAR | Status: AC | PRN
  Administered 2017-07-02: 0.5 mg via INTRAVENOUS
  Filled 2017-07-02: qty 1

## 2017-07-02 NOTE — ED Notes (Signed)
Pt to MRI

## 2017-07-02 NOTE — ED Notes (Signed)
Pt resting on stretcher with eyes open, NAD Pts family will be here to pick up patient asap

## 2017-07-02 NOTE — ED Notes (Signed)
Pt again found on the end of the bed, pt re oriented to call bell. Pt assisted to use bedside commode, very unsteady.

## 2017-07-02 NOTE — ED Notes (Signed)
Pts niece [name] arrived, she is very upset that patient is noncompliant at home with eating/drinking and taking medications. Family very disgruntled patient is being sent home, stating [place] has evaluated pt in the past with no assistance needed.  This RN suggested family have [place] assessed significant bruising to patients flank, arms and legs in various stages of healing when discussing fall risks.  Niece scolding patient for much of time regarding behaviors. Reminder given for neuro appt this week, niece stated she did not believe she could get pt to go to appt.  Pt dressed in paper scrubs, one person assist from bed to [facility] and into car.

## 2017-07-02 NOTE — Discharge Instructions (Signed)
In home health has been arranged to assess for safety. They will be in touch about home scheduling home visits.

## 2017-07-02 NOTE — ED Provider Notes (Signed)
Patient presents after having been found on the floor after a fall by niece, assumed to be recent fall (ice from her drink was not melted). She is confused with history of the same. No diagnosis of dementia. Lives alone, niece checks on her twice weekly and reports she has had increased falls over the last several weeks, felt likely due to overmedicating.   MRI pending to evaluate for CVA.   2:50 - MRI results show no acute abnormalities. Patient is ambulated to the bathroom requiring minimal assistance. She can be discharged home per plan of previous treatment team.    Charlann Lange, PA-C 07/02/17 0255  ADDENDUM: spoke to niece, Jilda Panda, who will pick the patient up from the ED and take her home.   Charlann Lange, PA-C 07/02/17 0301    Julianne Rice, MD 07/04/17 1028

## 2017-07-05 ENCOUNTER — Telehealth: Payer: Self-pay | Admitting: Internal Medicine

## 2017-07-05 ENCOUNTER — Ambulatory Visit: Payer: Medicare Other | Admitting: Diagnostic Neuroimaging

## 2017-07-05 ENCOUNTER — Encounter: Payer: Self-pay | Admitting: Diagnostic Neuroimaging

## 2017-07-05 VITALS — BP 134/66 | HR 54 | Ht 66.0 in | Wt 137.0 lb

## 2017-07-05 DIAGNOSIS — F0390 Unspecified dementia without behavioral disturbance: Secondary | ICD-10-CM

## 2017-07-05 DIAGNOSIS — R413 Other amnesia: Secondary | ICD-10-CM | POA: Diagnosis not present

## 2017-07-05 DIAGNOSIS — G459 Transient cerebral ischemic attack, unspecified: Secondary | ICD-10-CM

## 2017-07-05 DIAGNOSIS — W19XXXA Unspecified fall, initial encounter: Secondary | ICD-10-CM

## 2017-07-05 DIAGNOSIS — F03A0 Unspecified dementia, mild, without behavioral disturbance, psychotic disturbance, mood disturbance, and anxiety: Secondary | ICD-10-CM

## 2017-07-05 NOTE — Patient Instructions (Addendum)
Your Plan:  Continue aspirin 325mg  for stroke prevention    Thank you for coming to see [place] at [facility]. I hope we have been able to provide you high quality care today.  You may receive a patient satisfaction survey over the next few weeks. We would appreciate your feedback and comments so that we may continue to improve ourselves and the health of our patients.

## 2017-07-05 NOTE — Telephone Encounter (Signed)
[name] was seen at [facility] this afternoon with her niece. I am concerned for her safety with her memory loss and recent falls. We did an MMSE on her in which she scored 22. She states she does not remember falling or can describe the falls from when she was in the hospital. The niece would like you to contact her regarding an appointment for [place]. She does need assistance or a home safety eval.  Please contact me with any questions! The niece's phone number is [phone]. Her name is Angelica Kelley. Thank you!

## 2017-07-05 NOTE — Telephone Encounter (Signed)
[facility] notice advising;  patient was called to set up Home Health evaluation, patient declined home health services.

## 2017-07-05 NOTE — Progress Notes (Signed)
[facility] [address] [place]. [place] [address] [phone]       OFFICE FOLLOW UP NOTE  Ms. [name] Date of Birth:  09/24/1945 Medical Record Number:  [id number]   Reason for Referral: Hospital TIA versus psychoactive medication effect follow-up  CHIEF COMPLAINT:  Chief Complaint  Patient presents with  . Transient Ischemic Attack    rm 6, New Pt, hospital FU, niece- [name]    HPI:   Angelica Kelley, 72 y.o. female is being seen today for initial visit in the office for TIA versus psychoactive medication effect on 05/09/2017. History obtained from patient and chart review. Reviewed all radiology images and labs personally.   [name] is a 72 y.o. female with history of hypertension, hyperlipidemia, depression, anxiety, previous alcoholism, and on multiple sedating medications  presenting with slurred speech, a fall, and recent memory deficits. She did not receive IV t-PA due resolution of deficits.   CT head reviewed and showed no acute findings.  MRI head reviewed did show mild age-related volume loss but no acute or focal findings.  CTA head and neck showed atherosclerosis of the aortic arch and bilateral carotid bifurcation but no significant stenosis.  EEG was done and showed mild background slowing.  2D echo showed an EF of 60-65% but no PFO.  LDL 50-was on Crestor 20 mg daily prior to admission and this was resumed at discharge.  HbA1c 5.4.  Patient was previously on aspirin 81 mg prior to admission but was discharged on aspirin 325 mg.  Outpatient therapy recommended discharge with 24 hour/day supervision.    Patient presented today for follow-up visit after hospital discharge with niece.  Patient did have a recent hospitalization on 07/02/2017 due to a fall.  CT scan showed left-sided extra-axial fluid was likely residual from a prior subdural hematoma.  This was not found on the CT scan from her 05/09/2017 admission.  Patient states she cannot  remember falling where she would have hit her head.  Niece is concerned about large bruise on her left hip and patient is unable to state where this came from. Patient denies recent stroke/TIA symptoms such as numbness weakness or slurred speech.  Patient does continue to take aspirin 325 mg.  Niece assist patient with pills and places them in a weekly pill container but she states she is still unsure if she takes them appropriately as she will find random pills in the container such as sleeping pills.  Niece states that patient had an issue with taking over-the-counter medications and she tries to take them out of the house as she sees him.  He states on 05/14/2017 she was called by the patient's friends to come and pick her up as she was wobbling and confused after apparently taking 2 tabs of Benadryl and an energy drink.  Patient does not remember this occurring.  Patient does still drive on a daily basis.  She denies getting lost while driving or accidents.  Patient does live alone her niece checks on her twice a week.  Patient states she is safe in her home and does not need extra help.  She is able to maintain cleaning, laundry, bill paying, ATC on her own. Patient reports that she independently pays the bills and goes out for 1 meal a day.  Patient states she does not cook food in her house.  During her last hospital admission on 07/02/2017, home health and Adult Protective Services were called.  Per niece,  she also called Adult Protective Services.  On 07/05/2017, [facility] attempted to set up a home health evaluation with patient but she declined services.  Niece is requesting for them to call her.  Patient does have a brother that lives local.  Patient also does have several nieces and nephews but does not live local.  At the end of the visit, patient was willing to work with niece regarding receiving additional help in the home.  Patient was getting upset throughout appointment with talks of possibly  moving in with a caretaker, having a take caretaker move in with her or having to move into a different home altogether. Patient states she has had a memory loss for many years and she has been able to take care of herself without assistance. Patient has poor insight on this situation.   ROS:   14 system review of systems performed and negative with exception of easy bruising, ringing in ears, incontinence, constipation, depression, anxiety, confusion, distention or sit activities and hallucinations  PMH:  Past Medical History:  Diagnosis Date  . Alcoholism ([place])   . Anxiety   . Depression   . Elevated hemoglobin A1c   . Fracture of right wrist   . GERD (gastroesophageal reflux disease)   . Heart murmur   . Hyperlipidemia   . Hypertension   . IBS (irritable bowel syndrome)     PSH:  Past Surgical History:  Procedure Laterality Date  . ABDOMINAL HYSTERECTOMY  1991  . APPENDECTOMY    . BREAST SURGERY Left 1989   [facility]  . CARPAL TUNNEL RELEASE Right 10/18/2014   Procedure: CARPAL TUNNEL RELEASE;  Surgeon: Dorna Leitz, MD;  Location: [facility];  Service: Orthopedics;  Laterality: Right;  . [place]  . NECK SURGERY  Remote    Ruptured disc, per patient. Got infected.   . ORIF WRIST FRACTURE Right 10/18/2014   Procedure: OPEN REDUCTION INTERNAL FIXATION (ORIF) RIGHT WRIST FRACTURE;  Surgeon: Dorna Leitz, MD;  Location: [facility];  Service: Orthopedics;  Laterality: Right;  . TONSILLECTOMY  1965  . TUBAL LIGATION      Social History:  Social History   Socioeconomic History  . Marital status: Divorced    Spouse name: Not on file  . Number of children: Not on file  . Years of education: Not on file  . Highest education level: Not on file  Social Needs  . Financial resource strain: Not on file  . Food insecurity - worry: Not on file  . Food insecurity - inability: Not on file  . Transportation needs - medical: Not on file  . Transportation needs - non-medical: Not on  file  Occupational History  . Not on file  Tobacco Use  . Smoking status: Former Smoker    Types: Cigarettes  . Smokeless tobacco: Never Used  . Tobacco comment: quit yrs ago, 3 daily  Substance and Sexual Activity  . Alcohol use: No  . Drug use: No  . Sexual activity: No  Other Topics Concern  . Not on file  Social History Narrative   07/05/17 Pt's niece, phone number [phone]. Pt lives alone at home, and doesn't use a cane or walker, is still pretty active. Never smoker.    1 daughter- deceased   Education- 51   Retired from [employer]   Caffeine- coffee,1 daily, soda 1 daily    Family History:  Family History  Problem Relation Age of Onset  . Heart disease Mother   .  Diabetes Mother   . Leukemia Father   . Heart disease Brother   . Cancer Brother   . Parkinson's disease Brother     Medications:   Current Outpatient Medications on File Prior to Visit  Medication Sig Dispense Refill  . ALPRAZolam (XANAX) 1 MG tablet Take 1 mg by mouth 3 (three) times daily as needed for anxiety. For anxiety.     Marland Kitchen aspirin EC 325 MG EC tablet Take 1 tablet (325 mg total) by mouth daily. 30 tablet 0  . bisoprolol-hydrochlorothiazide (ZIAC) 5-6.25 MG tablet take 1 tablet by mouth once daily 90 tablet 1  . buPROPion (WELLBUTRIN XL) 150 MG 24 hr tablet Take 450 mg by mouth daily.    . Cholecalciferol (VITAMIN D3) 5000 UNITS CAPS Take 5,000 Units by mouth daily.     Marland Kitchen gabapentin (NEURONTIN) 600 MG tablet Take 300 mg by mouth at bedtime.   0  . hydrochlorothiazide (MICROZIDE) 12.5 MG capsule take 1 capsule by mouth EVERY OTHER DAY daily for blood pressure and FLUID 90 capsule 1  . lamoTRIgine (LAMICTAL) 200 MG tablet Take 400 mg by mouth daily.   0  . rosuvastatin (CRESTOR) 40 MG tablet take 1/2 to 1 tablet by mouth once daily (Patient taking differently: take 20 MG by mouth once daily) 90 tablet 1  . sertraline (ZOLOFT) 100 MG tablet Take 200 mg by mouth daily.   0   No current  facility-administered medications on file prior to visit.     Allergies:   Allergies  Allergen Reactions  . Lipitor [Atorvastatin]     Fatigue  . Prednisone Other (See Comments)    Change in mental status    Physical Exam  Vitals:   07/05/17 1105  BP: 134/66  Pulse: (!) 54  Weight: 137 lb (62.1 kg)  Height: 5\' 6"  (1.676 m)   Body mass index is 22.11 kg/m.  Visual Acuity Screening   Right eye Left eye Both eyes  Without correction: 20/30    With correction:  20/30   Comments: Wears contact in L eye for near vision   General:frail elderly Caucasian female, under nourished, seated, in no evident distress Head: head normocephalic and atraumatic.   Neck: supple with no carotid or supraclavicular bruits Cardiovascular: regular rate and rhythm, no murmurs Musculoskeletal: no deformity,  Skin: Bruises present on bilateral hand, large bruise present on left hip (patient unsure where this came from) Vascular:  Normal pulses all extremities  Neurologic Exam Mental Status: Patient alert but unsure of reason why she is at this visit today.  Unable to name location or remember what the appointment was for.  MMSE 22.  Poor insight.  Recall 2 out of 3.  Clock drawing 2/4. Animal naming 6 in 60 seconds.  MMSE - Mini Mental State Exam 07/05/2017  Orientation to time 5  Orientation to Place 4  Registration 2  Attention/ Calculation 1  Recall 2  Language- name 2 objects 2  Language- repeat 1  Language- follow 3 step command 3  Language- read & follow direction 1  Write a sentence 1  Copy design 0  Total score 22    Cranial Nerves: Fundoscopic exam reveals sharp disc margins. Pupils equal, briskly reactive to light. Extraocular movements full without nystagmus. Visual fields full to confrontation. Hearing intact. Facial sensation intact. Face, tongue, palate moves normally and symmetrically.  Motor: 4/5 bilateral hip flexors; 5/5 distal BLE; 5/5 RUE; 4/5 LUE (per patient, due to arm  pain) Sensory.:  intact to touch , pinprick , position and vibratory sensation.  Coordination: Rapid alternating movements normal in all extremities. Finger-to-nose and heel-to-shin performed accurately bilaterally. Gait and Station: Arises from chair without difficulty. Stance is normal. Gait demonstrates unsteady stride.  Tandem walk with difficulty, unable to perform.  Negative Romberg. Reflexes2+ and symmetric. Toes downgoing.    NIHSS 0 Modified Rankin  2    Diagnostic Data (Labs, Imaging, Testing)   Ct Head wo Contrast 07/01/2017 IMPRESSION: 1. Left-sided extra-axial fluid likely residual from prior subdural hematoma sometime after the last head CT. No acute hemorrhage. 3.5 mm of midline shift. 2. No findings for hemispheric infarction or hemorrhage. 3. Stable cerebellar atrophy. 4. No skull fracture.  Ct Head Wo Contrast 05/09/2017 IMPRESSION:  1. No acute intracranial hemorrhage.  2. Mild age-related atrophy and chronic microvascular ischemic changes.  3. No acute/traumatic cervical spine pathology. Multilevel degenerative changes.   Ct Cervical Spine Wo Contrast 05/09/2017 IMPRESSION:  1. No acute intracranial hemorrhage.  2. Mild age-related atrophy and chronic microvascular ischemic changes.  3. No acute/traumatic cervical spine pathology. Multilevel degenerative changes.   Mr Brain Wo Contrast [I reviewed images myself and agree with interpretation. -VRP]  05/10/2017 IMPRESSION:  Mild age related volume loss. No acute or focal finding. No traumatic finding.   CTA Head and Neck  05/11/2017 1. Atherosclerotic changes at the aortic arch and bilateral carotid bifurcations without significant stenosis. 2. 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 3. Torturous cervical vessels suggesting chronic hypertension.  Transthoracic Echocardiogram  05/10/2017 Study Conclusions - Left ventricle: The cavity size was normal.  Systolic function was normal. The estimated ejection fraction was in the range of 60% to 65%. Wall motion was normal; there were no regional wall motion abnormalities. Features are consistent with a pseudonormal left ventricular filling pattern, with concomitant abnormal relaxation and increased filling pressure (grade 2 diastolic dysfunction). Doppler parameters are consistent with elevated ventricular end-diastolic filling pressure. - Aortic valve: Functionally bicuspid; moderately thickened, moderately calcified leaflets. There was mild to moderate stenosis. There was trivial regurgitation. Mean gradient (S): 16 mm Hg. Peak gradient (S): 28 mm Hg. Valve area (VTI): 1.09 cm^2. Valve area (Vmax): 1.21 cm^2. Valve area (Vmean): 1.07 cm^2. - Aortic root: The aortic root was normal in size. - Mitral valve: There was mild regurgitation. - Left atrium: The atrium was severely dilated. - Right ventricle: Systolic function was normal. - Right atrium: The atrium was mildly dilated. - Tricuspid valve: There was severe regurgitation. - Pulmonic valve: There was no regurgitation. - Pulmonary arteries: Systolic pressure was moderately increased. PA peak pressure: 46 mm Hg (S). - Inferior vena cava: The vessel was normal in size. - Pericardium, extracardiac: There was no pericardial effusion.  EEG 05/10/2017 Impression: This is an abnormal EEG due to mild background slowing seen  throughout the tracing. This is a non-specific finding that can  be seen with toxic, metabolic, diffuse, or multifocal structural  processes. No definite epileptiform changes were noted.  A  single EEG without epileptiform changes does not exclude the  diagnosis of epilepsy. Clinical correlation advised.    ASSESSMENT: 72 y.o. year old female here with TIA versus psychoactive medication effect on 05/09/2017.  Vascular risk factors include hypertension and hyperlipidemia.  Patient also has  a history of depression, anxiety, previous alcoholism, and on multiple sedating medications.  Patient presents with recent hospitalization for fall since admission and decline in memory. Also with suspected underlying dementia.   Dx:  1. TIA (transient ischemic attack)   2. Fall, initial encounter   3. Memory loss   4. Mild dementia     PLAN:   STROKE PREVENTION - Patient will continue to take aspirin 325 mg for stroke prevention.    CONFUSION / DEMENTIA / POLYPHARMACY - Improve safety and supervision. Patient should not live alone. We will re-notify [facility] to contact niece to set up appointment for a home health evaluation.  Patient living at home alone and driving --> this is concerning for her safety and for others safety.  Patient has been having multiple falls in which she is not remembering all of them and she does have moderate memory loss.  Patient only eats one meal a day as she is unable to cook for herself.  Is unclear if patient is taking medications correctly or if she continues to take additional over-the-counter medications.  Will be imperative that patient does receive home health evaluation for safety with possible social worker involvement.  The niece was told to notify us with any questions or concerns. Adult protective services has been contacted already by the ER and by the patient's niece.  Return in about 3 months (around 10/02/2017).  Greater than 50% of time during this 25  minute visit was spent on counseling,explanation of diagnosis, planning of further management, discussion with patient and family and coordination of care.   Venancio Poisson, NP and   Penni Bombard, MD 2/70/7867, 5:44 PM Certified in Neurology, Neurophysiology and Neuroimaging  [facility] [address] [place], [place] [address] [phone]

## 2017-07-22 ENCOUNTER — Other Ambulatory Visit: Payer: Self-pay

## 2017-07-22 NOTE — Patient Outreach (Signed)
[facility]) Care Management  07/22/2017  Angelica Kelley [birth year] [id number]   Medication Adherence call to Angelica Kelley patient is showing past due under [place]. On Rosuvastatin 40 mg spoke with [facility] they said patient already pick up this medication on March 8th,2019 patient wont be due until 3 more month from now  June/2019.  [place] Management Direct Dial [phone]  Fax [phone] [email or web address]

## 2017-07-23 DIAGNOSIS — H40033 Anatomical narrow angle, bilateral: Secondary | ICD-10-CM | POA: Diagnosis not present

## 2017-07-23 DIAGNOSIS — H40013 Open angle with borderline findings, low risk, bilateral: Secondary | ICD-10-CM | POA: Diagnosis not present

## 2017-07-29 ENCOUNTER — Telehealth: Payer: Self-pay | Admitting: Diagnostic Neuroimaging

## 2017-07-29 DIAGNOSIS — W19XXXA Unspecified fall, initial encounter: Secondary | ICD-10-CM

## 2017-07-29 DIAGNOSIS — R413 Other amnesia: Secondary | ICD-10-CM

## 2017-07-29 DIAGNOSIS — G459 Transient cerebral ischemic attack, unspecified: Secondary | ICD-10-CM

## 2017-07-29 NOTE — Telephone Encounter (Signed)
I spoke to [facility], (they did receive referral and will forward to intake and if problem will call back).  (intake [phone]).

## 2017-07-29 NOTE — Telephone Encounter (Signed)
Spoke to niece, Jilda Panda, regarding [facility] referral.  I placed with PT/ Nursing and added SW and OT (written on order) to evaluate and treat.  I gave her the Phone # to [facility].    Niece, stated she is not driving as she lost her first set and Niece took the second set.  Still issues with medications.  (she has taken out what she has found, but there may be more hidden).  She will let us know id she does not hear from [facility].

## 2017-07-29 NOTE — Telephone Encounter (Signed)
Pt's niece called, she has concerns about the pt's behavior. She is concerned she thinks the pt taking too much medication, pt constantly is loosing her medication. Please call to advise  She also said they have not been contacted by [facility] which was discussed at [place] in February.

## 2017-08-02 DIAGNOSIS — Z8673 Personal history of transient ischemic attack (TIA), and cerebral infarction without residual deficits: Secondary | ICD-10-CM | POA: Diagnosis not present

## 2017-08-02 DIAGNOSIS — R4781 Slurred speech: Secondary | ICD-10-CM | POA: Diagnosis not present

## 2017-08-02 DIAGNOSIS — Z7982 Long term (current) use of aspirin: Secondary | ICD-10-CM | POA: Diagnosis not present

## 2017-08-02 DIAGNOSIS — Z9181 History of falling: Secondary | ICD-10-CM | POA: Diagnosis not present

## 2017-08-02 DIAGNOSIS — E785 Hyperlipidemia, unspecified: Secondary | ICD-10-CM | POA: Diagnosis not present

## 2017-08-02 DIAGNOSIS — I10 Essential (primary) hypertension: Secondary | ICD-10-CM | POA: Diagnosis not present

## 2017-08-02 DIAGNOSIS — Z87891 Personal history of nicotine dependence: Secondary | ICD-10-CM | POA: Diagnosis not present

## 2017-08-07 ENCOUNTER — Encounter: Payer: Self-pay | Admitting: Internal Medicine

## 2017-08-07 ENCOUNTER — Ambulatory Visit: Payer: Medicare Other | Admitting: Internal Medicine

## 2017-08-07 VITALS — BP 132/80 | HR 60 | Temp 97.5°F | Resp 16 | Ht 64.0 in | Wt 130.0 lb

## 2017-08-07 DIAGNOSIS — Z0000 Encounter for general adult medical examination without abnormal findings: Secondary | ICD-10-CM | POA: Diagnosis not present

## 2017-08-07 DIAGNOSIS — N183 Chronic kidney disease, stage 3 unspecified: Secondary | ICD-10-CM

## 2017-08-07 DIAGNOSIS — I10 Essential (primary) hypertension: Secondary | ICD-10-CM | POA: Diagnosis not present

## 2017-08-07 DIAGNOSIS — Z136 Encounter for screening for cardiovascular disorders: Secondary | ICD-10-CM | POA: Diagnosis not present

## 2017-08-07 DIAGNOSIS — Z1211 Encounter for screening for malignant neoplasm of colon: Secondary | ICD-10-CM

## 2017-08-07 DIAGNOSIS — Z79899 Other long term (current) drug therapy: Secondary | ICD-10-CM

## 2017-08-07 DIAGNOSIS — Z8249 Family history of ischemic heart disease and other diseases of the circulatory system: Secondary | ICD-10-CM

## 2017-08-07 DIAGNOSIS — R7309 Other abnormal glucose: Secondary | ICD-10-CM

## 2017-08-07 DIAGNOSIS — Z1212 Encounter for screening for malignant neoplasm of rectum: Secondary | ICD-10-CM

## 2017-08-07 DIAGNOSIS — F319 Bipolar disorder, unspecified: Secondary | ICD-10-CM

## 2017-08-07 DIAGNOSIS — E782 Mixed hyperlipidemia: Secondary | ICD-10-CM

## 2017-08-07 DIAGNOSIS — Z0001 Encounter for general adult medical examination with abnormal findings: Secondary | ICD-10-CM

## 2017-08-07 DIAGNOSIS — K219 Gastro-esophageal reflux disease without esophagitis: Secondary | ICD-10-CM

## 2017-08-07 DIAGNOSIS — Z87891 Personal history of nicotine dependence: Secondary | ICD-10-CM

## 2017-08-07 DIAGNOSIS — E559 Vitamin D deficiency, unspecified: Secondary | ICD-10-CM

## 2017-08-07 NOTE — Patient Instructions (Signed)

## 2017-08-07 NOTE — Progress Notes (Signed)
[facility] Unk Pinto, M.D.     Angelica Kelley. Silverio Lay, P.A.-C Liane Comber, [place] [address] [place] [address] Telephone [phone] Telefax [phone] Annual Screening/Preventative Visit & Comprehensive Evaluation &  Examination     This very nice 72 y.o. [name]  presents for a Screening/Preventative Visit & comprehensive evaluation and management of multiple medical co-morbidities.  Patient has been followed for HTN, HLD, Prediabetes, Depression and Vitamin D Deficiency. Patient's GERD is controlled on her meds.  Patient is followed by Dr Chucky May for Bipolar Depressive Disorder. Patient is still struggling with Depression for a a daughter who died about 2 years ago with a drug OD and her daughter's son (Patient's grandson) also died with a drug OD last year. Other problems for patient include personal hxx/o Alcoholism and she alleges abstinence.       HTN predates since 1995. Patient's BP has been controlled at home and patient denies any cardiac symptoms as chest pain, palpitations, shortness of breath, dizziness or ankle swelling. Today's BP is at goal - 132/80.      Patient's hyperlipidemia is controlled with diet and medications. Patient denies myalgias or other medication SE's. Last lipids were at goal:  Lab Results  Component Value Date   CHOL 125 05/10/2017   HDL 50 05/10/2017   LDLCALC 50 05/10/2017   TRIG 124 05/10/2017   CHOLHDL 2.5 05/10/2017      Patient has prediabetes (A1c 6.0%/2012) and patient denies reactive hypoglycemic symptoms, visual blurring, diabetic polys, or paresthesias. Last A1c was Normal & at goal: Lab Results  Component Value Date   HGBA1C 5.4 05/10/2017      Finally, patient has history of Vitamin D Deficiency ("13"/2010) and last Vitamin D was at goal: Lab Results  Component Value Date   VD25OH 95 01/15/2017   Current Outpatient Medications on File  Prior to Visit  Medication Sig  . ALPRAZolam (XANAX) 1 MG tablet Take 1 mg by mouth 3 (three) times daily as needed for anxiety. For anxiety.   Marland Kitchen aspirin EC 325 MG EC tablet Take 1 tablet (325 mg total) by mouth daily.  . bisoprolol-hydrochlorothiazide (ZIAC) 5-6.25 MG tablet take 1 tablet by mouth once daily  . buPROPion (WELLBUTRIN XL) 150 MG 24 hr tablet Take 450 mg by mouth daily.  . Cholecalciferol (VITAMIN D3) 5000 UNITS CAPS Take 5,000 Units by mouth daily.   Marland Kitchen gabapentin (NEURONTIN) 600 MG tablet Take 300 mg by mouth at bedtime.   . hydrochlorothiazide (MICROZIDE) 12.5 MG capsule take 1 capsule by mouth EVERY OTHER DAY daily for blood pressure and FLUID (Patient taking differently: Take 12.5 mg by mouth daily. take 1 capsule by mouth EVERY OTHER DAY daily for blood pressure and FLUID)  . lamoTRIgine (LAMICTAL) 200 MG tablet Take 400 mg by mouth daily.   . rosuvastatin (CRESTOR) 40 MG tablet take 1/2 to 1 tablet by mouth once daily (Patient taking differently: take 20 MG by mouth once daily)  . sertraline (ZOLOFT) 100 MG tablet Take 200 mg by mouth daily.    No current facility-administered medications on file prior to visit.    Allergies  Allergen Reactions  . Lipitor [Atorvastatin]     Fatigue  . Prednisone Other (See Comments)    Change in mental status   Past Medical History:  Diagnosis Date  . Alcoholism ([place])   . Anxiety   . Depression   . Elevated hemoglobin A1c   .  Fracture of right wrist   . GERD (gastroesophageal reflux disease)   . Heart murmur   . Hyperlipidemia   . Hypertension   . IBS (irritable bowel syndrome)    Health Maintenance  Topic Date Due  . Hepatitis C Screening  10-28-1945  . COLONOSCOPY  03/27/2017  . MAMMOGRAM  05/18/2018  . TETANUS/TDAP  07/26/2025  . INFLUENZA VACCINE  Completed  . DEXA SCAN  Completed  . PNA vac Low Risk Adult  Completed   Immunization History  Administered Date(s) Administered  . DT 07/27/2015  . Influenza Split  02/22/2017  . Influenza, High Dose Seasonal PF 01/28/2014  . Influenza-Unspecified 03/15/2011, 01/04/2015, 02/17/2016  . Pneumococcal Conjugate-13 06/09/2014, 07/12/2016  . Pneumococcal-Unspecified 05/14/2008  . Td 05/14/2005  . Zoster 01/23/2012  . Zoster Recombinat (Shingrix) 02/22/2017   Last Colon - 01.03.2013 - Dr Earlean Shawl & recc 5 yr f/u (she's Overdue) Last MGM - 01.05.2018 & patient is reminded to schedule her MGM.  Past Surgical History:  Procedure Laterality Date  . ABDOMINAL HYSTERECTOMY  1991  . APPENDECTOMY    . BREAST SURGERY Left 1989   [facility]  . CARPAL TUNNEL RELEASE Right 10/18/2014   Procedure: CARPAL TUNNEL RELEASE;  Surgeon: Dorna Leitz, MD;  Location: [facility];  Service: Orthopedics;  Laterality: Right;  . [place]  . NECK SURGERY  Remote    Ruptured disc, per patient. Got infected.   . ORIF WRIST FRACTURE Right 10/18/2014   Procedure: OPEN REDUCTION INTERNAL FIXATION (ORIF) RIGHT WRIST FRACTURE;  Surgeon: Dorna Leitz, MD;  Location: [facility];  Service: Orthopedics;  Laterality: Right;  . TONSILLECTOMY  1965  . TUBAL LIGATION     Family History  Problem Relation Age of Onset  . Heart disease Mother   . Diabetes Mother   . Leukemia Father   . Heart disease Brother   . Cancer Brother   . Parkinson's disease Brother    Social History   Tobacco Use  . Smoking status: Former Smoker    Types: Cigarettes  . Smokeless tobacco: Never Used  . Tobacco comment: quit yrs ago, 3 daily  Substance Use Topics  . Alcohol use: Yes, hx/o Alcoholism  . Drug use: No    ROS Constitutional: Denies fever, chills, weight loss/gain, headaches, insomnia,  night sweats, and change in appetite. Does c/o fatigue. Eyes: Denies redness, blurred vision, diplopia, discharge, itchy, watery eyes.  ENT: Denies discharge, congestion, post nasal drip, epistaxis, sore throat, earache, hearing loss, dental pain, Tinnitus, Vertigo, Sinus pain, snoring.  Cardio: Denies chest pain,  palpitations, irregular heartbeat, syncope, dyspnea, diaphoresis, orthopnea, PND, claudication, edema Respiratory: denies cough, dyspnea, DOE, pleurisy, hoarseness, laryngitis, wheezing.  Gastrointestinal: Denies dysphagia, heartburn, reflux, water brash, pain, cramps, nausea, vomiting, bloating, diarrhea, constipation, hematemesis, melena, hematochezia, jaundice, hemorrhoids Genitourinary: Denies dysuria, frequency, urgency, nocturia, hesitancy, discharge, hematuria, flank pain Breast: Breast lumps, nipple discharge, bleeding.  Musculoskeletal: Denies arthralgia, myalgia, stiffness, Jt. Swelling, pain, limp, and strain/sprain. Denies falls. Skin: Denies puritis, rash, hives, warts, acne, eczema, changing in skin lesion Neuro: No weakness, tremor, incoordination, spasms, paresthesia, pain Psychiatric: Denies confusion, memory loss, sensory loss. Denies Depression. Endocrine: Denies change in weight, skin, hair change, nocturia, and paresthesia, diabetic polys, visual blurring, hyper / hypo glycemic episodes.  Heme/Lymph: No excessive bleeding, bruising, enlarged lymph nodes.  Physical Exam  BP 132/80   Pulse 60   Temp (!) 97.5 F (36.4 C)   Resp 16   Ht 5\' 4"  (1.626 m)  Wt 130 lb (59 kg)   BMI 22.31 kg/m   General Appearance: Well nourished, well groomed and in no apparent distress.  Eyes: PERRLA, EOMs, conjunctiva no swelling or erythema, normal fundi and vessels. Sinuses: No frontal/maxillary tenderness ENT/Mouth: EACs patent / TMs  nl. Nares clear without erythema, swelling, mucoid exudates. Oral hygiene is good. No erythema, swelling, or exudate. Tongue normal, non-obstructing. Tonsils not swollen or erythematous. Hearing normal.  Neck: Supple, thyroid not palpable. No bruits, nodes or JVD. Respiratory: Respiratory effort normal.  BS equal and clear bilateral without rales, rhonci, wheezing or stridor. Cardio: Heart sounds are normal with regular rate and rhythm and no murmurs,  rubs or gallops. Peripheral pulses are normal and equal bilaterally without edema. No aortic or femoral bruits. Chest: symmetric with normal excursions and percussion. Breasts: Symmetric, without lumps, nipple discharge, retractions, or fibrocystic changes.  Abdomen: Flat, soft with bowel sounds active. Nontender, no guarding, rebound, hernias, masses, or organomegaly.  Lymphatics: Non tender without lymphadenopathy.  Musculoskeletal: Full ROM all peripheral extremities, joint stability, 5/5 strength, and normal gait. Skin: Warm and dry without rashes, lesions, cyanosis, clubbing or  ecchymosis.  Neuro: Cranial nerves intact, reflexes equal bilaterally. Normal muscle tone, no cerebellar symptoms. Sensation intact.  Pysch: Alert and oriented X 3, normal affect, Insight and Judgment appropriate.   Assessment and Plan  1. Annual Preventative Screening Examination  2. Essential hypertension  - EKG 12-Lead - Urinalysis, Routine w reflex microscopic - Microalbumin / creatinine urine ratio - CBC with Differential/Platelet - BASIC METABOLIC PANEL WITH GFR - Magnesium - TSH  3. Hyperlipidemia, mixed  - EKG 12-Lead - Hepatic function panel - Lipid panel - TSH  4. Abnormal glucose  - EKG 12-Lead - Hemoglobin A1c - Insulin, random  5. Vitamin D deficiency  - VITAMIN D 25 Hydroxyl  6. PreDiabetes  - EKG 12-Lead - Hemoglobin A1c - Insulin, random  7. Gastroesophageal reflux disease  - CBC with Differential/Platelet  8. Bipolar depression ([place])  9. Chronic kidney disease, stage 3 (HCC)  - Urinalysis, Routine w reflex microscopic - Microalbumin / creatinine urine ratio - BASIC METABOLIC PANEL WITH GFR  10. Encounter for colorectal cancer screening  - POC Hemoccult Bld/Stl  11. Screening for ischemic heart disease  - EKG 12-Lead  12. Former smoker  - EKG 12-Lead  13. FHx: heart disease  - EKG 12-Lead  14. Medication management  - Urinalysis, Routine w  reflex microscopic - Microalbumin / creatinine urine ratio - CBC with Differential/Platelet - BASIC METABOLIC PANEL WITH GFR - Hepatic function panel - Magnesium - Lipid panel - TSH - Hemoglobin A1c - Insulin, random - VITAMIN D 25 Hydroxyl           Patient was counseled in prudent diet to achieve/maintain BMI less than 25 for weight control, BP monitoring, regular exercise and medications. Discussed med's effects and SE's. Screening labs and tests as requested with regular follow-up as recommended. Over 40 minutes of exam, counseling, chart review and high complex critical decision making was performed.

## 2017-08-08 DIAGNOSIS — I10 Essential (primary) hypertension: Secondary | ICD-10-CM | POA: Diagnosis not present

## 2017-08-08 DIAGNOSIS — R4781 Slurred speech: Secondary | ICD-10-CM | POA: Diagnosis not present

## 2017-08-08 DIAGNOSIS — Z8673 Personal history of transient ischemic attack (TIA), and cerebral infarction without residual deficits: Secondary | ICD-10-CM | POA: Diagnosis not present

## 2017-08-08 DIAGNOSIS — E785 Hyperlipidemia, unspecified: Secondary | ICD-10-CM | POA: Diagnosis not present

## 2017-08-08 DIAGNOSIS — Z7982 Long term (current) use of aspirin: Secondary | ICD-10-CM | POA: Diagnosis not present

## 2017-08-08 DIAGNOSIS — Z87891 Personal history of nicotine dependence: Secondary | ICD-10-CM | POA: Diagnosis not present

## 2017-08-08 DIAGNOSIS — Z9181 History of falling: Secondary | ICD-10-CM | POA: Diagnosis not present

## 2017-08-08 LAB — HEPATIC FUNCTION PANEL
AG RATIO: 2.1 (calc) (ref 1.0–2.5)
ALKALINE PHOSPHATASE (APISO): 50 U/L (ref 33–130)
ALT: 9 U/L (ref 6–29)
AST: 15 U/L (ref 10–35)
Albumin: 4.5 g/dL (ref 3.6–5.1)
BILIRUBIN DIRECT: 0.1 mg/dL (ref 0.0–0.2)
BILIRUBIN INDIRECT: 0.3 mg/dL (ref 0.2–1.2)
GLOBULIN: 2.1 g/dL (ref 1.9–3.7)
TOTAL PROTEIN: 6.6 g/dL (ref 6.1–8.1)
Total Bilirubin: 0.4 mg/dL (ref 0.2–1.2)

## 2017-08-08 LAB — CBC WITH DIFFERENTIAL/PLATELET
BASOS ABS: 50 {cells}/uL (ref 0–200)
Basophils Relative: 0.7 %
EOS PCT: 2.1 %
Eosinophils Absolute: 151 cells/uL (ref 15–500)
HCT: 38.3 % (ref 35.0–45.0)
HEMOGLOBIN: 12.4 g/dL (ref 11.7–15.5)
Lymphs Abs: 1346 cells/uL (ref 850–3900)
MCH: 28.2 pg (ref 27.0–33.0)
MCHC: 32.4 g/dL (ref 32.0–36.0)
MCV: 87 fL (ref 80.0–100.0)
MONOS PCT: 7 %
MPV: 12.2 fL (ref 7.5–12.5)
NEUTROS ABS: 5148 {cells}/uL (ref 1500–7800)
Neutrophils Relative %: 71.5 %
PLATELETS: 200 10*3/uL (ref 140–400)
RBC: 4.4 10*6/uL (ref 3.80–5.10)
RDW: 12 % (ref 11.0–15.0)
TOTAL LYMPHOCYTE: 18.7 %
WBC mixed population: 504 cells/uL (ref 200–950)
WBC: 7.2 10*3/uL (ref 3.8–10.8)

## 2017-08-08 LAB — LIPID PANEL
CHOL/HDL RATIO: 2.5 (calc) (ref <5.0)
CHOLESTEROL: 174 mg/dL (ref <200)
HDL: 70 mg/dL (ref >50)
LDL CHOLESTEROL (CALC): 83 mg/dL
Non-HDL Cholesterol (Calc): 104 mg/dL (calc) (ref <130)
Triglycerides: 113 mg/dL (ref <150)

## 2017-08-08 LAB — URINALYSIS, ROUTINE W REFLEX MICROSCOPIC
Bilirubin Urine: NEGATIVE
Glucose, UA: NEGATIVE
HGB URINE DIPSTICK: NEGATIVE
Ketones, ur: NEGATIVE
LEUKOCYTES UA: NEGATIVE
NITRITE: NEGATIVE
PROTEIN: NEGATIVE
Specific Gravity, Urine: 1.021 (ref 1.001–1.03)
pH: 6.5 (ref 5.0–8.0)

## 2017-08-08 LAB — HEMOGLOBIN A1C
EAG (MMOL/L): 6 (calc)
HEMOGLOBIN A1C: 5.4 %{Hb} (ref <5.7)
MEAN PLASMA GLUCOSE: 108 (calc)

## 2017-08-08 LAB — MICROALBUMIN / CREATININE URINE RATIO
CREATININE, URINE: 92 mg/dL (ref 20–275)
MICROALB/CREAT RATIO: 7 ug/mg{creat} (ref <30)
Microalb, Ur: 0.6 mg/dL

## 2017-08-08 LAB — MAGNESIUM: Magnesium: 2.4 mg/dL (ref 1.5–2.5)

## 2017-08-08 LAB — BASIC METABOLIC PANEL WITH GFR
BUN / CREAT RATIO: 28 (calc) — AB (ref 6–22)
BUN: 28 mg/dL — ABNORMAL HIGH (ref 7–25)
CHLORIDE: 101 mmol/L (ref 98–110)
CO2: 31 mmol/L (ref 20–32)
CREATININE: 1 mg/dL — AB (ref 0.60–0.93)
Calcium: 9.5 mg/dL (ref 8.6–10.4)
GFR, EST AFRICAN AMERICAN: 64 mL/min/{1.73_m2} (ref >=60)
GFR, Est Non African American: 55 mL/min/{1.73_m2} — ABNORMAL LOW (ref >=60)
GLUCOSE: 96 mg/dL (ref 65–99)
Potassium: 3.8 mmol/L (ref 3.5–5.3)
SODIUM: 140 mmol/L (ref 135–146)

## 2017-08-08 LAB — INSULIN, RANDOM: Insulin: 4 u[IU]/mL (ref 2.0–19.6)

## 2017-08-08 LAB — TSH: TSH: 1.13 m[IU]/L (ref 0.40–4.50)

## 2017-08-08 LAB — VITAMIN D 25 HYDROXY (VIT D DEFICIENCY, FRACTURES): Vit D, 25-Hydroxy: 110 ng/mL — ABNORMAL HIGH (ref 30–100)

## 2017-08-09 DIAGNOSIS — Z8673 Personal history of transient ischemic attack (TIA), and cerebral infarction without residual deficits: Secondary | ICD-10-CM | POA: Diagnosis not present

## 2017-08-09 DIAGNOSIS — I10 Essential (primary) hypertension: Secondary | ICD-10-CM | POA: Diagnosis not present

## 2017-08-09 DIAGNOSIS — E785 Hyperlipidemia, unspecified: Secondary | ICD-10-CM | POA: Diagnosis not present

## 2017-08-09 DIAGNOSIS — Z9181 History of falling: Secondary | ICD-10-CM | POA: Diagnosis not present

## 2017-08-09 DIAGNOSIS — R4781 Slurred speech: Secondary | ICD-10-CM | POA: Diagnosis not present

## 2017-08-09 DIAGNOSIS — Z87891 Personal history of nicotine dependence: Secondary | ICD-10-CM | POA: Diagnosis not present

## 2017-08-09 DIAGNOSIS — Z7982 Long term (current) use of aspirin: Secondary | ICD-10-CM | POA: Diagnosis not present

## 2017-08-13 DIAGNOSIS — Z7982 Long term (current) use of aspirin: Secondary | ICD-10-CM | POA: Diagnosis not present

## 2017-08-13 DIAGNOSIS — E785 Hyperlipidemia, unspecified: Secondary | ICD-10-CM | POA: Diagnosis not present

## 2017-08-13 DIAGNOSIS — Z87891 Personal history of nicotine dependence: Secondary | ICD-10-CM | POA: Diagnosis not present

## 2017-08-13 DIAGNOSIS — Z9181 History of falling: Secondary | ICD-10-CM | POA: Diagnosis not present

## 2017-08-13 DIAGNOSIS — R4781 Slurred speech: Secondary | ICD-10-CM | POA: Diagnosis not present

## 2017-08-13 DIAGNOSIS — Z8673 Personal history of transient ischemic attack (TIA), and cerebral infarction without residual deficits: Secondary | ICD-10-CM | POA: Diagnosis not present

## 2017-08-13 DIAGNOSIS — I10 Essential (primary) hypertension: Secondary | ICD-10-CM | POA: Diagnosis not present

## 2017-08-14 DIAGNOSIS — R4781 Slurred speech: Secondary | ICD-10-CM | POA: Diagnosis not present

## 2017-08-14 DIAGNOSIS — E785 Hyperlipidemia, unspecified: Secondary | ICD-10-CM | POA: Diagnosis not present

## 2017-08-14 DIAGNOSIS — Z7982 Long term (current) use of aspirin: Secondary | ICD-10-CM | POA: Diagnosis not present

## 2017-08-14 DIAGNOSIS — Z9181 History of falling: Secondary | ICD-10-CM | POA: Diagnosis not present

## 2017-08-14 DIAGNOSIS — I10 Essential (primary) hypertension: Secondary | ICD-10-CM | POA: Diagnosis not present

## 2017-08-14 DIAGNOSIS — Z87891 Personal history of nicotine dependence: Secondary | ICD-10-CM | POA: Diagnosis not present

## 2017-08-14 DIAGNOSIS — Z8673 Personal history of transient ischemic attack (TIA), and cerebral infarction without residual deficits: Secondary | ICD-10-CM | POA: Diagnosis not present

## 2017-08-15 DIAGNOSIS — Z87891 Personal history of nicotine dependence: Secondary | ICD-10-CM | POA: Diagnosis not present

## 2017-08-15 DIAGNOSIS — Z8673 Personal history of transient ischemic attack (TIA), and cerebral infarction without residual deficits: Secondary | ICD-10-CM | POA: Diagnosis not present

## 2017-08-15 DIAGNOSIS — I10 Essential (primary) hypertension: Secondary | ICD-10-CM | POA: Diagnosis not present

## 2017-08-15 DIAGNOSIS — Z9181 History of falling: Secondary | ICD-10-CM | POA: Diagnosis not present

## 2017-08-15 DIAGNOSIS — E785 Hyperlipidemia, unspecified: Secondary | ICD-10-CM | POA: Diagnosis not present

## 2017-08-15 DIAGNOSIS — R4781 Slurred speech: Secondary | ICD-10-CM | POA: Diagnosis not present

## 2017-08-15 DIAGNOSIS — Z7982 Long term (current) use of aspirin: Secondary | ICD-10-CM | POA: Diagnosis not present

## 2017-08-19 ENCOUNTER — Emergency Department (HOSPITAL_COMMUNITY): Payer: Medicare Other

## 2017-08-19 ENCOUNTER — Other Ambulatory Visit: Payer: Self-pay

## 2017-08-19 ENCOUNTER — Encounter (HOSPITAL_COMMUNITY): Payer: Self-pay

## 2017-08-19 ENCOUNTER — Inpatient Hospital Stay (HOSPITAL_COMMUNITY)
Admission: EM | Admit: 2017-08-19 | Discharge: 2017-08-27 | DRG: 917 | Disposition: A | Discharge status: Skilled Nursing Facility | Payer: Medicare Other | Attending: Family Medicine | Admitting: Family Medicine

## 2017-08-19 DIAGNOSIS — W19XXXA Unspecified fall, initial encounter: Secondary | ICD-10-CM

## 2017-08-19 DIAGNOSIS — G9340 Encephalopathy, unspecified: Secondary | ICD-10-CM

## 2017-08-19 DIAGNOSIS — M6281 Muscle weakness (generalized): Secondary | ICD-10-CM | POA: Diagnosis not present

## 2017-08-19 DIAGNOSIS — Z90710 Acquired absence of both cervix and uterus: Secondary | ICD-10-CM

## 2017-08-19 DIAGNOSIS — J9601 Acute respiratory failure with hypoxia: Secondary | ICD-10-CM | POA: Diagnosis not present

## 2017-08-19 DIAGNOSIS — K589 Irritable bowel syndrome without diarrhea: Secondary | ICD-10-CM | POA: Diagnosis present

## 2017-08-19 DIAGNOSIS — Z781 Physical restraint status: Secondary | ICD-10-CM

## 2017-08-19 DIAGNOSIS — T148XXA Other injury of unspecified body region, initial encounter: Secondary | ICD-10-CM | POA: Diagnosis present

## 2017-08-19 DIAGNOSIS — K219 Gastro-esophageal reflux disease without esophagitis: Secondary | ICD-10-CM | POA: Diagnosis present

## 2017-08-19 DIAGNOSIS — Z7982 Long term (current) use of aspirin: Secondary | ICD-10-CM

## 2017-08-19 DIAGNOSIS — I5033 Acute on chronic diastolic (congestive) heart failure: Secondary | ICD-10-CM | POA: Diagnosis not present

## 2017-08-19 DIAGNOSIS — T424X1A Poisoning by benzodiazepines, accidental (unintentional), initial encounter: Principal | ICD-10-CM | POA: Diagnosis present

## 2017-08-19 DIAGNOSIS — R410 Disorientation, unspecified: Secondary | ICD-10-CM | POA: Diagnosis not present

## 2017-08-19 DIAGNOSIS — Z9851 Tubal ligation status: Secondary | ICD-10-CM

## 2017-08-19 DIAGNOSIS — R402441 Other coma, without documented Glasgow coma scale score, or with partial score reported, in the field [EMT or ambulance]: Secondary | ICD-10-CM | POA: Diagnosis not present

## 2017-08-19 DIAGNOSIS — T796XXA Traumatic ischemia of muscle, initial encounter: Secondary | ICD-10-CM

## 2017-08-19 DIAGNOSIS — S3991XA Unspecified injury of abdomen, initial encounter: Secondary | ICD-10-CM | POA: Diagnosis not present

## 2017-08-19 DIAGNOSIS — D62 Acute posthemorrhagic anemia: Secondary | ICD-10-CM | POA: Diagnosis not present

## 2017-08-19 DIAGNOSIS — R2681 Unsteadiness on feet: Secondary | ICD-10-CM | POA: Diagnosis not present

## 2017-08-19 DIAGNOSIS — Y92009 Unspecified place in unspecified non-institutional (private) residence as the place of occurrence of the external cause: Secondary | ICD-10-CM

## 2017-08-19 DIAGNOSIS — M6282 Rhabdomyolysis: Secondary | ICD-10-CM | POA: Diagnosis present

## 2017-08-19 DIAGNOSIS — Z8249 Family history of ischemic heart disease and other diseases of the circulatory system: Secondary | ICD-10-CM

## 2017-08-19 DIAGNOSIS — R000 Tachycardia, unspecified: Secondary | ICD-10-CM | POA: Diagnosis not present

## 2017-08-19 DIAGNOSIS — R7303 Prediabetes: Secondary | ICD-10-CM | POA: Diagnosis not present

## 2017-08-19 DIAGNOSIS — R451 Restlessness and agitation: Secondary | ICD-10-CM | POA: Diagnosis not present

## 2017-08-19 DIAGNOSIS — Z79899 Other long term (current) drug therapy: Secondary | ICD-10-CM | POA: Diagnosis not present

## 2017-08-19 DIAGNOSIS — Z833 Family history of diabetes mellitus: Secondary | ICD-10-CM

## 2017-08-19 DIAGNOSIS — I4891 Unspecified atrial fibrillation: Secondary | ICD-10-CM | POA: Diagnosis not present

## 2017-08-19 DIAGNOSIS — F0390 Unspecified dementia without behavioral disturbance: Secondary | ICD-10-CM | POA: Diagnosis not present

## 2017-08-19 DIAGNOSIS — K922 Gastrointestinal hemorrhage, unspecified: Secondary | ICD-10-CM | POA: Diagnosis not present

## 2017-08-19 DIAGNOSIS — R55 Syncope and collapse: Secondary | ICD-10-CM | POA: Diagnosis not present

## 2017-08-19 DIAGNOSIS — E860 Dehydration: Secondary | ICD-10-CM | POA: Diagnosis present

## 2017-08-19 DIAGNOSIS — I480 Paroxysmal atrial fibrillation: Secondary | ICD-10-CM | POA: Diagnosis not present

## 2017-08-19 DIAGNOSIS — R062 Wheezing: Secondary | ICD-10-CM

## 2017-08-19 DIAGNOSIS — R296 Repeated falls: Secondary | ICD-10-CM | POA: Diagnosis not present

## 2017-08-19 DIAGNOSIS — S0990XA Unspecified injury of head, initial encounter: Secondary | ICD-10-CM | POA: Diagnosis not present

## 2017-08-19 DIAGNOSIS — J9811 Atelectasis: Secondary | ICD-10-CM | POA: Diagnosis not present

## 2017-08-19 DIAGNOSIS — Z8673 Personal history of transient ischemic attack (TIA), and cerebral infarction without residual deficits: Secondary | ICD-10-CM

## 2017-08-19 DIAGNOSIS — Z9181 History of falling: Secondary | ICD-10-CM | POA: Diagnosis not present

## 2017-08-19 DIAGNOSIS — Z806 Family history of leukemia: Secondary | ICD-10-CM

## 2017-08-19 DIAGNOSIS — N179 Acute kidney failure, unspecified: Secondary | ICD-10-CM | POA: Diagnosis present

## 2017-08-19 DIAGNOSIS — G92 Toxic encephalopathy: Secondary | ICD-10-CM | POA: Diagnosis present

## 2017-08-19 DIAGNOSIS — F319 Bipolar disorder, unspecified: Secondary | ICD-10-CM | POA: Diagnosis present

## 2017-08-19 DIAGNOSIS — D649 Anemia, unspecified: Secondary | ICD-10-CM | POA: Diagnosis not present

## 2017-08-19 DIAGNOSIS — G9341 Metabolic encephalopathy: Secondary | ICD-10-CM | POA: Diagnosis not present

## 2017-08-19 DIAGNOSIS — I10 Essential (primary) hypertension: Secondary | ICD-10-CM | POA: Diagnosis not present

## 2017-08-19 DIAGNOSIS — E782 Mixed hyperlipidemia: Secondary | ICD-10-CM | POA: Diagnosis present

## 2017-08-19 DIAGNOSIS — I110 Hypertensive heart disease with heart failure: Secondary | ICD-10-CM | POA: Diagnosis not present

## 2017-08-19 DIAGNOSIS — Z87891 Personal history of nicotine dependence: Secondary | ICD-10-CM

## 2017-08-19 DIAGNOSIS — R748 Abnormal levels of other serum enzymes: Secondary | ICD-10-CM | POA: Diagnosis not present

## 2017-08-19 DIAGNOSIS — Z888 Allergy status to other drugs, medicaments and biological substances status: Secondary | ICD-10-CM

## 2017-08-19 DIAGNOSIS — I5031 Acute diastolic (congestive) heart failure: Secondary | ICD-10-CM | POA: Diagnosis not present

## 2017-08-19 DIAGNOSIS — S199XXA Unspecified injury of neck, initial encounter: Secondary | ICD-10-CM | POA: Diagnosis not present

## 2017-08-19 DIAGNOSIS — S299XXA Unspecified injury of thorax, initial encounter: Secondary | ICD-10-CM | POA: Diagnosis not present

## 2017-08-19 DIAGNOSIS — E876 Hypokalemia: Secondary | ICD-10-CM | POA: Diagnosis not present

## 2017-08-19 DIAGNOSIS — G8911 Acute pain due to trauma: Secondary | ICD-10-CM | POA: Diagnosis not present

## 2017-08-19 DIAGNOSIS — I361 Nonrheumatic tricuspid (valve) insufficiency: Secondary | ICD-10-CM | POA: Diagnosis not present

## 2017-08-19 DIAGNOSIS — R488 Other symbolic dysfunctions: Secondary | ICD-10-CM | POA: Diagnosis not present

## 2017-08-19 LAB — URINALYSIS, ROUTINE W REFLEX MICROSCOPIC
Bilirubin Urine: NEGATIVE
CELLULAR CAST UA: 3
GLUCOSE, UA: NEGATIVE mg/dL
Ketones, ur: NEGATIVE mg/dL
Leukocytes, UA: NEGATIVE
Nitrite: NEGATIVE
PROTEIN: 30 mg/dL — AB
Specific Gravity, Urine: 1.023 (ref 1.005–1.030)
pH: 5 (ref 5.0–8.0)

## 2017-08-19 LAB — CBC WITH DIFFERENTIAL/PLATELET
BASOS ABS: 0 10*3/uL (ref 0.0–0.1)
Basophils Relative: 0 %
Eosinophils Absolute: 0 10*3/uL (ref 0.0–0.7)
Eosinophils Relative: 0 %
HEMATOCRIT: 30.6 % — AB (ref 36.0–46.0)
HEMOGLOBIN: 9.7 g/dL — AB (ref 12.0–15.0)
LYMPHS PCT: 5 %
Lymphs Abs: 0.7 10*3/uL (ref 0.7–4.0)
MCH: 27.9 pg (ref 26.0–34.0)
MCHC: 31.7 g/dL (ref 30.0–36.0)
MCV: 87.9 fL (ref 78.0–100.0)
MONOS PCT: 6 %
Monocytes Absolute: 0.9 10*3/uL (ref 0.1–1.0)
NEUTROS ABS: 12.7 10*3/uL — AB (ref 1.7–7.7)
NEUTROS PCT: 89 %
Platelets: 192 10*3/uL (ref 150–400)
RBC: 3.48 MIL/uL — ABNORMAL LOW (ref 3.87–5.11)
RDW: 13.6 % (ref 11.5–15.5)
WBC: 14.3 10*3/uL — ABNORMAL HIGH (ref 4.0–10.5)

## 2017-08-19 LAB — TROPONIN I: TROPONIN I: 0.05 ng/mL — AB (ref <0.03)

## 2017-08-19 LAB — I-STAT VENOUS BLOOD GAS, ED
ACID-BASE EXCESS: 1 mmol/L (ref 0.0–2.0)
BICARBONATE: 25.1 mmol/L (ref 20.0–28.0)
O2 Saturation: 52 %
PCO2 VEN: 37.3 mmHg — AB (ref 44.0–60.0)
PH VEN: 7.435 — AB (ref 7.250–7.430)
PO2 VEN: 26 mmHg — AB (ref 32.0–45.0)
TCO2: 26 mmol/L (ref 22–32)

## 2017-08-19 LAB — COMPREHENSIVE METABOLIC PANEL
ALBUMIN: 3.6 g/dL (ref 3.5–5.0)
ALT: 43 U/L (ref 14–54)
ANION GAP: 17 — AB (ref 5–15)
AST: 121 U/L — ABNORMAL HIGH (ref 15–41)
Alkaline Phosphatase: 61 U/L (ref 38–126)
BUN: 51 mg/dL — ABNORMAL HIGH (ref 6–20)
CO2: 23 mmol/L (ref 22–32)
Calcium: 9.3 mg/dL (ref 8.9–10.3)
Chloride: 102 mmol/L (ref 101–111)
Creatinine, Ser: 1.85 mg/dL — ABNORMAL HIGH (ref 0.44–1.00)
GFR calc non Af Amer: 26 mL/min — ABNORMAL LOW (ref >60)
GFR, EST AFRICAN AMERICAN: 30 mL/min — AB (ref >60)
GLUCOSE: 121 mg/dL — AB (ref 65–99)
POTASSIUM: 2.8 mmol/L — AB (ref 3.5–5.1)
SODIUM: 142 mmol/L (ref 135–145)
TOTAL PROTEIN: 6 g/dL — AB (ref 6.5–8.1)
Total Bilirubin: 1 mg/dL (ref 0.3–1.2)

## 2017-08-19 LAB — RETICULOCYTES
RBC.: 3.12 MIL/uL — ABNORMAL LOW (ref 3.87–5.11)
RETIC COUNT ABSOLUTE: 59.3 10*3/uL (ref 19.0–186.0)
Retic Ct Pct: 1.9 % (ref 0.4–3.1)

## 2017-08-19 LAB — VITAMIN B12: Vitamin B-12: 1042 pg/mL — ABNORMAL HIGH (ref 180–914)

## 2017-08-19 LAB — I-STAT CHEM 8, ED
BUN: 42 mg/dL — ABNORMAL HIGH (ref 6–20)
CHLORIDE: 103 mmol/L (ref 101–111)
Calcium, Ion: 1.12 mmol/L — ABNORMAL LOW (ref 1.15–1.40)
Creatinine, Ser: 1.8 mg/dL — ABNORMAL HIGH (ref 0.44–1.00)
Glucose, Bld: 122 mg/dL — ABNORMAL HIGH (ref 65–99)
HEMATOCRIT: 28 % — AB (ref 36.0–46.0)
HEMOGLOBIN: 9.5 g/dL — AB (ref 12.0–15.0)
Potassium: 2.8 mmol/L — ABNORMAL LOW (ref 3.5–5.1)
SODIUM: 142 mmol/L (ref 135–145)
TCO2: 25 mmol/L (ref 22–32)

## 2017-08-19 LAB — CBC
HCT: 27.6 % — ABNORMAL LOW (ref 36.0–46.0)
HEMOGLOBIN: 8.9 g/dL — AB (ref 12.0–15.0)
MCH: 28.5 pg (ref 26.0–34.0)
MCHC: 32.2 g/dL (ref 30.0–36.0)
MCV: 88.5 fL (ref 78.0–100.0)
PLATELETS: 177 10*3/uL (ref 150–400)
RBC: 3.12 MIL/uL — ABNORMAL LOW (ref 3.87–5.11)
RDW: 13.7 % (ref 11.5–15.5)
WBC: 13.5 10*3/uL — AB (ref 4.0–10.5)

## 2017-08-19 LAB — ETHANOL

## 2017-08-19 LAB — IRON AND TIBC
IRON: 31 ug/dL (ref 28–170)
SATURATION RATIOS: 11 % (ref 10.4–31.8)
TIBC: 291 ug/dL (ref 250–450)
UIBC: 260 ug/dL

## 2017-08-19 LAB — PROTIME-INR
INR: 1.1
PROTHROMBIN TIME: 14.2 s (ref 11.4–15.2)

## 2017-08-19 LAB — TYPE AND SCREEN
ABO/RH(D): A POS
Antibody Screen: NEGATIVE

## 2017-08-19 LAB — CK: Total CK: 5453 U/L — ABNORMAL HIGH (ref 38–234)

## 2017-08-19 LAB — FOLATE: FOLATE: 11 ng/mL (ref >5.9)

## 2017-08-19 LAB — CBG MONITORING, ED
GLUCOSE-CAPILLARY: 106 mg/dL — AB (ref 65–99)
GLUCOSE-CAPILLARY: 125 mg/dL — AB (ref 65–99)

## 2017-08-19 LAB — POC OCCULT BLOOD, ED: Fecal Occult Bld: NEGATIVE

## 2017-08-19 LAB — AMMONIA: Ammonia: 16 umol/L (ref 9–35)

## 2017-08-19 LAB — I-STAT TROPONIN, ED: Troponin i, poc: 0.02 ng/mL (ref 0.00–0.08)

## 2017-08-19 LAB — TSH: TSH: 1.617 u[IU]/mL (ref 0.350–4.500)

## 2017-08-19 LAB — I-STAT CG4 LACTIC ACID, ED
LACTIC ACID, VENOUS: 1.74 mmol/L (ref 0.5–1.9)
Lactic Acid, Venous: 1.37 mmol/L (ref 0.5–1.9)

## 2017-08-19 LAB — SALICYLATE LEVEL: Salicylate Lvl: <7 mg/dL (ref 2.8–30.0)

## 2017-08-19 LAB — ACETAMINOPHEN LEVEL: Acetaminophen (Tylenol), Serum: <10 ug/mL — ABNORMAL LOW (ref 10–30)

## 2017-08-19 LAB — FERRITIN: FERRITIN: 119 ng/mL (ref 11–307)

## 2017-08-19 LAB — MAGNESIUM: MAGNESIUM: 2.7 mg/dL — AB (ref 1.7–2.4)

## 2017-08-19 MED ORDER — THIAMINE HCL 100 MG/ML IJ SOLN
Freq: Once | INTRAVENOUS | Status: AC
  Administered 2017-08-19: 22:00:00 via INTRAVENOUS
  Filled 2017-08-19: qty 1000

## 2017-08-19 MED ORDER — LORAZEPAM 2 MG/ML IJ SOLN
0.5000 mg | Freq: Once | INTRAMUSCULAR | Status: AC
  Administered 2017-08-19: 0.5 mg via INTRAVENOUS
  Filled 2017-08-19: qty 1

## 2017-08-19 MED ORDER — SODIUM CHLORIDE 0.9 % IV SOLN
INTRAVENOUS | Status: AC
  Administered 2017-08-20 (×2): via INTRAVENOUS

## 2017-08-19 MED ORDER — PANTOPRAZOLE SODIUM 40 MG IV SOLR
40.0000 mg | Freq: Two times a day (BID) | INTRAVENOUS | Status: DC
  Administered 2017-08-20 – 2017-08-23 (×7): 40 mg via INTRAVENOUS
  Filled 2017-08-19 (×7): qty 40

## 2017-08-19 MED ORDER — VANCOMYCIN HCL IN DEXTROSE 1-5 GM/200ML-% IV SOLN
1000.0000 mg | Freq: Once | INTRAVENOUS | Status: AC
  Administered 2017-08-19: 1000 mg via INTRAVENOUS
  Filled 2017-08-19: qty 200

## 2017-08-19 MED ORDER — ACETAMINOPHEN 650 MG RE SUPP: 650.0000 mg | RECTAL | Status: DC | PRN

## 2017-08-19 MED ORDER — SODIUM CHLORIDE 0.9 % IV BOLUS
1000.0000 mL | Freq: Once | INTRAVENOUS | Status: AC
  Administered 2017-08-19: 1000 mL via INTRAVENOUS

## 2017-08-19 MED ORDER — VANCOMYCIN HCL IN DEXTROSE 750-5 MG/150ML-% IV SOLN
750.0000 mg | INTRAVENOUS | Status: DC
  Administered 2017-08-20: 750 mg via INTRAVENOUS
  Filled 2017-08-19: qty 150

## 2017-08-19 MED ORDER — PANTOPRAZOLE SODIUM 40 MG IV SOLR
40.0000 mg | Freq: Once | INTRAVENOUS | Status: AC
  Administered 2017-08-19: 40 mg via INTRAVENOUS
  Filled 2017-08-19: qty 40

## 2017-08-19 MED ORDER — ACETAMINOPHEN 160 MG/5ML PO SOLN: 650.0000 mg | ORAL | Status: DC | PRN

## 2017-08-19 MED ORDER — THIAMINE HCL 100 MG/ML IJ SOLN
100.0000 mg | Freq: Once | INTRAMUSCULAR | Status: AC
  Administered 2017-08-19: 100 mg via INTRAVENOUS
  Filled 2017-08-19: qty 2

## 2017-08-19 MED ORDER — ACETAMINOPHEN 325 MG PO TABS: 650.0000 mg | ORAL_TABLET | ORAL | Status: DC | PRN

## 2017-08-19 MED ORDER — LORAZEPAM 2 MG/ML IJ SOLN
0.5000 mg | Freq: Once | INTRAMUSCULAR | Status: DC
  Filled 2017-08-19: qty 1

## 2017-08-19 MED ORDER — POTASSIUM CHLORIDE 10 MEQ/100ML IV SOLN
10.0000 meq | INTRAVENOUS | Status: AC
  Administered 2017-08-19 – 2017-08-20 (×3): 10 meq via INTRAVENOUS
  Filled 2017-08-19 (×3): qty 100

## 2017-08-19 MED ORDER — PIPERACILLIN-TAZOBACTAM 3.375 G IVPB: 3.3750 g | Freq: Three times a day (TID) | INTRAVENOUS | Status: DC

## 2017-08-19 MED ORDER — AMPICILLIN SODIUM 2 G IJ SOLR
2.0000 g | Freq: Three times a day (TID) | INTRAMUSCULAR | Status: DC
  Administered 2017-08-20: 2 g via INTRAVENOUS
  Filled 2017-08-19 (×3): qty 2000

## 2017-08-19 MED ORDER — DEXTROSE 5 % IV SOLN
600.0000 mg | Freq: Two times a day (BID) | INTRAVENOUS | Status: DC
  Administered 2017-08-20: 600 mg via INTRAVENOUS
  Filled 2017-08-19 (×2): qty 12

## 2017-08-19 MED ORDER — THIAMINE HCL 100 MG/ML IJ SOLN
100.0000 mg | Freq: Every day | INTRAMUSCULAR | Status: DC
  Administered 2017-08-20 – 2017-08-23 (×4): 100 mg via INTRAVENOUS
  Filled 2017-08-19 (×4): qty 2

## 2017-08-19 MED ORDER — PIPERACILLIN-TAZOBACTAM 3.375 G IVPB 30 MIN
3.3750 g | Freq: Once | INTRAVENOUS | Status: AC
  Administered 2017-08-19: 3.375 g via INTRAVENOUS
  Filled 2017-08-19: qty 50

## 2017-08-19 MED ORDER — SODIUM CHLORIDE 0.9 % IV SOLN
2.0000 g | Freq: Two times a day (BID) | INTRAVENOUS | Status: DC
  Administered 2017-08-20: 2 g via INTRAVENOUS
  Filled 2017-08-19: qty 20

## 2017-08-19 NOTE — ED Provider Notes (Signed)
[facility] EMERGENCY DEPARTMENT Provider Note   CSN: [id number] Arrival date & time: 08/19/17  1740     History   Chief Complaint Chief Complaint  Patient presents with  . Weakness  . Fall    HPI Angelica Kelley is a 72 y.o. female.  The history is provided by the EMS personnel. No language interpreter was used.  Weakness   Fall    Angelica Kelley is a 72 y.o. female who presents to the Emergency Department complaining of fall.  Level V caveat due to AMS. History is provided by EMS. She was last seen well on Friday. When checked on by family today she was found to be on the floor and confused. Patient is unable to provide any history. Past Medical History:  Diagnosis Date  . Alcoholism ([place])   . Anxiety   . Depression   . Elevated hemoglobin A1c   . Fracture of right wrist   . GERD (gastroesophageal reflux disease)   . Heart murmur   . Hyperlipidemia   . Hypertension   . IBS (irritable bowel syndrome)     Patient Active Problem List   Diagnosis Date Noted  . Acute encephalopathy 08/19/2017  . ARF (acute renal failure) ([place]) 08/19/2017  . Hypokalemia 08/19/2017  . Rhabdomyolysis 08/19/2017  . AKI (acute kidney injury) ([place])   . TIA (transient ischemic attack) 06/18/2017  . Depression with anxiety 05/09/2017  . Fall 05/09/2017  . Slurred speech 05/09/2017  . Acute renal failure superimposed on stage 2 chronic kidney disease ([place]) 05/09/2017  . Chronic kidney disease, stage 3 ([place]) 04/23/2017  . OAB (overactive bladder) 06/09/2014  . Vitamin D deficiency 10/27/2013  . Medication management 10/27/2013  . Hypertension   . Hyperlipidemia, mixed   . GERD (gastroesophageal reflux disease)   . Bipolar depression ([place])   . Alcoholism ([place])   . IBS (irritable bowel syndrome)   . Other abnormal glucose     Past Surgical History:  Procedure Laterality Date  . ABDOMINAL HYSTERECTOMY  1991  . APPENDECTOMY    . BREAST SURGERY Left 1989   [facility]  .  CARPAL TUNNEL RELEASE Right 10/18/2014   Procedure: CARPAL TUNNEL RELEASE;  Surgeon: Dorna Leitz, MD;  Location: [facility];  Service: Orthopedics;  Laterality: Right;  . [place]  . NECK SURGERY  Remote    Ruptured disc, per patient. Got infected.   . ORIF WRIST FRACTURE Right 10/18/2014   Procedure: OPEN REDUCTION INTERNAL FIXATION (ORIF) RIGHT WRIST FRACTURE;  Surgeon: Dorna Leitz, MD;  Location: [facility];  Service: Orthopedics;  Laterality: Right;  . TONSILLECTOMY  1965  . TUBAL LIGATION       OB History   None      Home Medications    Prior to Admission medications   Medication Sig Start Date End Date Taking? Authorizing Provider  ALPRAZolam Duanne Moron) 1 MG tablet Take 1 mg by mouth 2 (two) times daily. For anxiety.    Yes [provider]  aspirin EC 325 MG EC tablet Take 1 tablet (325 mg total) by mouth daily. 05/12/17  Yes Mikhail, [place], DO  bisoprolol-hydrochlorothiazide [facility]) 5-6.25 MG tablet take 1 tablet by mouth once daily 03/13/17  Yes Unk Pinto, MD  buPROPion (WELLBUTRIN XL) 150 MG 24 hr tablet Take 450 mg by mouth daily.   Yes [provider]  Cholecalciferol (VITAMIN D3) 5000 UNITS CAPS Take 5,000 Units by mouth daily. Only take 5 days  week  Yes [provider]  gabapentin (NEURONTIN) 300 MG capsule Take 300 mg by mouth at bedtime.  10/26/16  Yes [provider]  hydrochlorothiazide (MICROZIDE) 12.5 MG capsule take 1 capsule by mouth EVERY OTHER DAY daily for blood pressure and FLUID Patient taking differently: Take 12.5 mg by mouth daily. take 1 capsule by mouth EVERY OTHER DAY daily for blood pressure and FLUID 05/16/17  Yes Vicie Mutters, PA-C  lamoTRIgine (LAMICTAL) 200 MG tablet Take 400 mg by mouth daily.  04/21/15  Yes [provider]  rosuvastatin (CRESTOR) 40 MG tablet take 1/2 to 1 tablet by mouth once daily Patient taking differently: take 20 MG by mouth once daily 03/13/17  Yes Unk Pinto, MD    sertraline (ZOLOFT) 100 MG tablet Take 200 mg by mouth daily.  04/11/15  Yes [provider]  Thiamine HCl (VITAMIN B-1) 250 MG tablet Take 250 mg by mouth 2 (two) times daily.   Yes [provider]    Family History Family History  Problem Relation Age of Onset  . Heart disease Mother   . Diabetes Mother   . Leukemia Father   . Heart disease Brother   . Cancer Brother   . Parkinson's disease Brother     Social History Social History   Tobacco Use  . Smoking status: Former Smoker    Types: Cigarettes  . Smokeless tobacco: Never Used  . Tobacco comment: quit yrs ago, 3 daily  Substance Use Topics  . Alcohol use: No  . Drug use: No     Allergies   Lipitor [atorvastatin] and Prednisone   Review of Systems Review of Systems  Unable to perform ROS: Mental status change  Neurological: Positive for weakness.     Physical Exam Updated Vital Signs BP (!) 120/55   Pulse 70   Temp 99.9 F (37.7 C) (Rectal)   Resp (!) 23   Ht 5\' 4"  (1.626 m)   Wt 59 kg (130 lb)   SpO2 (!) 86%   BMI 22.31 kg/m   Physical Exam  Constitutional: She appears well-developed and well-nourished.  Ill appearing  HENT:  Head: Normocephalic and atraumatic.  Very dry mucous membranes. Dried black material in the mouth.  Cardiovascular: Normal rate and regular rhythm.  No murmur heard. Pulmonary/Chest: Effort normal and breath sounds normal. No respiratory distress.  Abdominal: Soft. There is no tenderness. There is no rebound and no guarding.  Musculoskeletal:  Diffuse ecchymosis throughout the arms and legs.  Neurological:  Drowsy but arousable to verbal stimuli.. This arthritic speech. Disoriented to place in time. 4 to 5 strength in left upper left lower extremity. 2/5 strength in the right upper and lower extremities.  Skin: Skin is warm and dry.  Psychiatric:  Unable to assess  Nursing note and vitals reviewed.    ED Treatments / Results  Labs (all labs  ordered are listed, but only abnormal results are displayed) Labs Reviewed  URINALYSIS, ROUTINE W REFLEX MICROSCOPIC - Abnormal; Notable for the following components:      Result Value   Color, Urine AMBER (*)    APPearance CLOUDY (*)    Hgb urine dipstick LARGE (*)    Protein, ur 30 (*)    Bacteria, UA FEW (*)    Squamous Epithelial / LPF 0-5 (*)    All other components within normal limits  COMPREHENSIVE METABOLIC PANEL - Abnormal; Notable for the following components:   Potassium 2.8 (*)    Glucose, Bld 121 (*)  BUN 51 (*)    Creatinine, Ser 1.85 (*)    Total Protein 6.0 (*)    AST 121 (*)    GFR calc non Af Amer 26 (*)    GFR calc Af Amer 30 (*)    Anion gap 17 (*)    All other components within normal limits  CBC WITH DIFFERENTIAL/PLATELET - Abnormal; Notable for the following components:   WBC 14.3 (*)    RBC 3.48 (*)    Hemoglobin 9.7 (*)    HCT 30.6 (*)    Neutro Abs 12.7 (*)    All other components within normal limits  CK - Abnormal; Notable for the following components:   Total CK 5,453 (*)    All other components within normal limits  MAGNESIUM - Abnormal; Notable for the following components:   Magnesium 2.7 (*)    All other components within normal limits  ACETAMINOPHEN LEVEL - Abnormal; Notable for the following components:   Acetaminophen (Tylenol), Serum <10 (*)    All other components within normal limits  VITAMIN B12 - Abnormal; Notable for the following components:   Vitamin B-12 1,042 (*)    All other components within normal limits  RETICULOCYTES - Abnormal; Notable for the following components:   RBC. 3.12 (*)    All other components within normal limits  CBC - Abnormal; Notable for the following components:   WBC 13.5 (*)    RBC 3.12 (*)    Hemoglobin 8.9 (*)    HCT 27.6 (*)    All other components within normal limits  CBG MONITORING, ED - Abnormal; Notable for the following components:   Glucose-Capillary 106 (*)    All other components  within normal limits  I-STAT VENOUS BLOOD GAS, ED - Abnormal; Notable for the following components:   pH, Ven 7.435 (*)    pCO2, Ven 37.3 (*)    pO2, Ven 26.0 (*)    All other components within normal limits  I-STAT CHEM 8, ED - Abnormal; Notable for the following components:   Potassium 2.8 (*)    BUN 42 (*)    Creatinine, Ser 1.80 (*)    Glucose, Bld 122 (*)    Calcium, Ion 1.12 (*)    Hemoglobin 9.5 (*)    HCT 28.0 (*)    All other components within normal limits  CULTURE, BLOOD (ROUTINE X 2)  CULTURE, BLOOD (ROUTINE X 2)  URINE CULTURE  PROTIME-INR  ETHANOL  SALICYLATE LEVEL  AMMONIA  FOLATE  IRON AND TIBC  FERRITIN  TSH  TROPONIN I  CBC  CBC  RAPID URINE DRUG SCREEN, HOSP PERFORMED  CK  I-STAT CG4 LACTIC ACID, ED  I-STAT TROPONIN, ED  I-STAT CG4 LACTIC ACID, ED  POC OCCULT BLOOD, ED  TYPE AND SCREEN  ABO/RH    EKG EKG Interpretation  Date/Time:  Monday August 19 2017 18:09:58 EDT Ventricular Rate:  68 PR Interval:    QRS Duration: 117 QT Interval:  562 QTC Calculation: 598 R Axis:   -2 Text Interpretation:  Sinus rhythm Short PR interval Incomplete left bundle branch block Probable left ventricular hypertrophy Abnormal T, consider ischemia, anterior leads Prolonged QT interval Confirmed by Quintella Reichert [phone]) on 08/19/2017 9:47:29 PM   Radiology Ct Head Wo Contrast  Result Date: 08/19/2017 CLINICAL DATA:  72 year old female found on floor by family. Last seen 3 days ago. Initial encounter. EXAM: CT HEAD WITHOUT CONTRAST CT CERVICAL SPINE WITHOUT CONTRAST TECHNIQUE: Multidetector CT imaging of the head and  cervical spine was performed following the standard protocol without intravenous contrast. Multiplanar CT image reconstructions of the cervical spine were also generated. COMPARISON:  07/02/2017 MR. 07/01/2016 CT head and cervical spine FINDINGS: CT HEAD FINDINGS Brain: Exam is motion degraded. Resolution of previously noted left-sided chronic subdural  hematoma. No new intracranial hemorrhage noted. Chronic microvascular changes without CT evidence of large acute infarct. Global atrophy. No intracranial mass lesion noted on this unenhanced exam. Vascular: Vascular calcifications Skull: No skull fracture Sinuses/Orbits: No acute orbital abnormality. Visualized paranasal sinuses are clear. Mastoid air cells and middle ear cavities are clear. Temporomandibular joint degenerative changes. Other: Left parietal scalp hematoma. CT CERVICAL SPINE FINDINGS Alignment: Curvature cervical spine convex left. Skull base and vertebrae: No cervical spine fracture noted. Soft tissues and spinal canal: No abnormal prevertebral soft tissue swelling. Disc levels: Multilevel cervical spondylotic changes most prominent C5-6 level. Upper chest: Scarring lung apices. Other: Bilateral carotid bifurcation calcifications. IMPRESSION: Exams are motion degraded. Interval clearing of previously noted left-sided chronic subdural hematoma. No new intracranial hemorrhage noted. Chronic microvascular changes without CT evidence of large acute infarct. Atrophy. No cervical spine fracture detected. Curvature cervical spine convex left. No abnormal prevertebral soft tissue swelling. Cervical spondylotic changes similar to prior exam. Electronically Signed   By: Genia Del M.D.   On: 08/19/2017 19:47   Ct Cervical Spine Wo Contrast  Result Date: 08/19/2017 CLINICAL DATA:  72 year old female found on floor by family. Last seen 3 days ago. Initial encounter. EXAM: CT HEAD WITHOUT CONTRAST CT CERVICAL SPINE WITHOUT CONTRAST TECHNIQUE: Multidetector CT imaging of the head and cervical spine was performed following the standard protocol without intravenous contrast. Multiplanar CT image reconstructions of the cervical spine were also generated. COMPARISON:  07/02/2017 MR. 07/01/2016 CT head and cervical spine FINDINGS: CT HEAD FINDINGS Brain: Exam is motion degraded. Resolution of previously noted  left-sided chronic subdural hematoma. No new intracranial hemorrhage noted. Chronic microvascular changes without CT evidence of large acute infarct. Global atrophy. No intracranial mass lesion noted on this unenhanced exam. Vascular: Vascular calcifications Skull: No skull fracture Sinuses/Orbits: No acute orbital abnormality. Visualized paranasal sinuses are clear. Mastoid air cells and middle ear cavities are clear. Temporomandibular joint degenerative changes. Other: Left parietal scalp hematoma. CT CERVICAL SPINE FINDINGS Alignment: Curvature cervical spine convex left. Skull base and vertebrae: No cervical spine fracture noted. Soft tissues and spinal canal: No abnormal prevertebral soft tissue swelling. Disc levels: Multilevel cervical spondylotic changes most prominent C5-6 level. Upper chest: Scarring lung apices. Other: Bilateral carotid bifurcation calcifications. IMPRESSION: Exams are motion degraded. Interval clearing of previously noted left-sided chronic subdural hematoma. No new intracranial hemorrhage noted. Chronic microvascular changes without CT evidence of large acute infarct. Atrophy. No cervical spine fracture detected. Curvature cervical spine convex left. No abnormal prevertebral soft tissue swelling. Cervical spondylotic changes similar to prior exam. Electronically Signed   By: Genia Del M.D.   On: 08/19/2017 19:47   Dg Chest Port 1 View  Result Date: 08/19/2017 CLINICAL DATA:  Found down today. EXAM: PORTABLE CHEST 1 VIEW COMPARISON:  Chest radiograph July 01, 2017 FINDINGS: Cardiac silhouette is mildly enlarged and unchanged. Calcified aortic knob. Chronic interstitial changes without pleural effusion or focal consolidation. No pneumothorax. Stable apical pleural thickening. Osteopenia. Severe LEFT shoulder osteoarthrosis, incompletely imaged. IMPRESSION: Stable cardiomegaly and chronic interstitial changes. Aortic Atherosclerosis (ICD10-I70.0). Electronically Signed   By:  Elon Alas M.D.   On: 08/19/2017 18:26    Procedures Procedures (including critical care time) CRITICAL CARE  Performed by: Quintella Reichert   Total critical care time: 45 minutes  Critical care time was exclusive of separately billable procedures and treating other patients.  Critical care was necessary to treat or prevent imminent or life-threatening deterioration.  Critical care was time spent personally by me on the following activities: development of treatment plan with patient and/or surrogate as well as nursing, discussions with consultants, evaluation of patient's response to treatment, examination of patient, obtaining history from patient or surrogate, ordering and performing treatments and interventions, ordering and review of laboratory studies, ordering and review of radiographic studies, pulse oximetry and re-evaluation of patient's condition.  Medications Ordered in ED Medications  vancomycin (VANCOCIN) IVPB 750 mg/150 ml premix (has no administration in time range)  potassium chloride 10 mEq in 100 mL IVPB (10 mEq Intravenous New Bag/Given 08/19/17 2148)  0.9 %  sodium chloride infusion (has no administration in time range)  acetaminophen (TYLENOL) tablet 650 mg (has no administration in time range)    Or  acetaminophen (TYLENOL) solution 650 mg (has no administration in time range)    Or  acetaminophen (TYLENOL) suppository 650 mg (has no administration in time range)  pantoprazole (PROTONIX) injection 40 mg (has no administration in time range)  acyclovir (ZOVIRAX) 600 mg in dextrose 5 % 100 mL IVPB (has no administration in time range)  cefTRIAXone (ROCEPHIN) 2 g in sodium chloride 0.9 % 100 mL IVPB (has no administration in time range)  ampicillin (OMNIPEN) 2 g in sodium chloride 0.9 % 100 mL IVPB (has no administration in time range)  sodium chloride 0.9 % bolus 1,000 mL (1,000 mLs Intravenous New Bag/Given 08/19/17 1818)  sodium chloride 0.9 % bolus 1,000 mL  (1,000 mLs Intravenous New Bag/Given 08/19/17 1825)  piperacillin-tazobactam (ZOSYN) IVPB 3.375 g (0 g Intravenous Stopped 08/19/17 1912)  vancomycin (VANCOCIN) IVPB 1000 mg/200 mL premix (0 mg Intravenous Stopped 08/19/17 1946)  thiamine (B-1) injection 100 mg (100 mg Intravenous Given 08/19/17 1956)  sodium chloride 0.9 % 1,000 mL with thiamine 094 mg, folic acid 1 mg, multivitamins adult 10 mL infusion ( Intravenous New Bag/Given 08/19/17 2148)  pantoprazole (PROTONIX) injection 40 mg (40 mg Intravenous Given 08/19/17 1957)  LORazepam (ATIVAN) injection 0.5 mg (0.5 mg Intravenous Given 08/19/17 1953)     Initial Impression / Assessment and Plan / ED Course  I have reviewed the triage vital signs and the nursing notes.  Pertinent labs & imaging results that were available during my care of the patient were reviewed by me and considered in my medical decision making (see chart for details).     Patient here for evaluation after being found down. She is agitated and altered on evaluation. She has multiple areas of ecchymosis but no clear evidence of tenderness. She was treated with IV fluids for dehydration. Concern for possible sepsis and she was started on broad-spectrum antibiotics. Hemoglobin is significantly decreased compared to priors. She does have dried black material in her mouth, no vomiting in the department. Rectal examination is brown he negative stool. Unclear where drop in hemoglobin is coming from. Hospitalist consulted for admission for further treatment. Patient was given a dose of Ativan for agitation and this did worsened her agitation and precipitated hallucinations. Do not recommend further Ativan.  Final Clinical Impressions(s) / ED Diagnoses   Final diagnoses:  Fall, initial encounter  Delirium  Traumatic rhabdomyolysis, initial encounter ([place])  AKI (acute kidney injury) [facility])    ED Discharge Orders    None  Quintella Reichert, MD 08/19/17 2255

## 2017-08-19 NOTE — ED Notes (Signed)
Main lab to add on Mg 

## 2017-08-19 NOTE — H&P (Addendum)
History and Physical    Angelica Kelley OAC:[id number] DOB: 1945/08/11 DOA: 08/19/2017  PCP: Unk Pinto, MD  Patient coming from: Home.  History obtained from patient's brother and patient's niece.  Patient is confused.  Chief Complaint: Confusion.  HPI: Angelica Kelley is a 72 y.o. female with history of hypertension, hyperlipidemia, prediabetes, TIA and bipolar disorder who had recent visit with her primary care physician on August 07, 2017 was found to be confused and on the floor by patient's niece when patient's knees went to check on her this afternoon.  As per patient's niece 4 days ago on Friday she had talked to her and she was fine.  The following morning she decreased a bit confused when she was talking on the phone.  As a routine she went to check on her today afternoon and was unable to get in and had to break in.  On regains patient was found to be on the floor confused.  Patient was bruised all over.  Patient was brought to the ER.  ED Course: CT head and C-spine was done which did not show any acute except for chronic tearing of the subdural hematoma.  Chest x-ray was unremarkable.  UA was showing features concerning for UTI.  Ammonia levels are negative.  On exam patient also had some dark discoloration around her tongue which is concerning for hematemesis.  Stool for occult blood was negative.  Patient's hemoglobin has dropped by 3 g last 10 days.  Creatinine has increased by 0.8.  Labs also revealed rhabdomyolysis.  On exam patient is confused agitated.  Patient's agitation became worse after patient received 2 doses of IV Ativan.  Patient EKG shows prolonged QT interval.  Patient is being admitted for acute encephalopathy cause not clear.  Unable to do lumbar puncture due to patient being very agitated.  Patient is empirically started on antibiotics for encephalitis and meningitis.  Blood cultures obtained.  Review of Systems: As per HPI, rest all negative.   Past Medical  History:  Diagnosis Date  . Alcoholism ([place])   . Anxiety   . Depression   . Elevated hemoglobin A1c   . Fracture of right wrist   . GERD (gastroesophageal reflux disease)   . Heart murmur   . Hyperlipidemia   . Hypertension   . IBS (irritable bowel syndrome)     Past Surgical History:  Procedure Laterality Date  . ABDOMINAL HYSTERECTOMY  1991  . APPENDECTOMY    . BREAST SURGERY Left 1989   [facility]  . CARPAL TUNNEL RELEASE Right 10/18/2014   Procedure: CARPAL TUNNEL RELEASE;  Surgeon: Dorna Leitz, MD;  Location: [facility];  Service: Orthopedics;  Laterality: Right;  . [place]  . NECK SURGERY  Remote    Ruptured disc, per patient. Got infected.   . ORIF WRIST FRACTURE Right 10/18/2014   Procedure: OPEN REDUCTION INTERNAL FIXATION (ORIF) RIGHT WRIST FRACTURE;  Surgeon: Dorna Leitz, MD;  Location: [facility];  Service: Orthopedics;  Laterality: Right;  . TONSILLECTOMY  1965  . TUBAL LIGATION       reports that she has quit smoking. Her smoking use included cigarettes. She has never used smokeless tobacco. She reports that she does not drink alcohol or use drugs.  Allergies  Allergen Reactions  . Lipitor [Atorvastatin]     Fatigue  . Prednisone Other (See Comments)    Change in mental status    Family History  Problem Relation Age of Onset  .  Heart disease Mother   . Diabetes Mother   . Leukemia Father   . Heart disease Brother   . Cancer Brother   . Parkinson's disease Brother     Prior to Admission medications   Medication Sig Start Date End Date Taking? Authorizing Provider  ALPRAZolam Duanne Moron) 1 MG tablet Take 1 mg by mouth 2 (two) times daily. For anxiety.    Yes [provider]  aspirin EC 325 MG EC tablet Take 1 tablet (325 mg total) by mouth daily. 05/12/17  Yes Mikhail, [place], DO  bisoprolol-hydrochlorothiazide [facility]) 5-6.25 MG tablet take 1 tablet by mouth once daily 03/13/17  Yes Unk Pinto, MD  buPROPion (WELLBUTRIN XL) 150 MG 24 hr  tablet Take 450 mg by mouth daily.   Yes [provider]  Cholecalciferol (VITAMIN D3) 5000 UNITS CAPS Take 5,000 Units by mouth daily. Only take 5 days  week   Yes [provider]  gabapentin (NEURONTIN) 300 MG capsule Take 300 mg by mouth at bedtime.  10/26/16  Yes [provider]  hydrochlorothiazide (MICROZIDE) 12.5 MG capsule take 1 capsule by mouth EVERY OTHER DAY daily for blood pressure and FLUID Patient taking differently: Take 12.5 mg by mouth daily. take 1 capsule by mouth EVERY OTHER DAY daily for blood pressure and FLUID 05/16/17  Yes Vicie Mutters, PA-C  lamoTRIgine (LAMICTAL) 200 MG tablet Take 400 mg by mouth daily.  04/21/15  Yes [provider]  rosuvastatin (CRESTOR) 40 MG tablet take 1/2 to 1 tablet by mouth once daily Patient taking differently: take 20 MG by mouth once daily 03/13/17  Yes Unk Pinto, MD  sertraline (ZOLOFT) 100 MG tablet Take 200 mg by mouth daily.  04/11/15  Yes [provider]  Thiamine HCl (VITAMIN B-1) 250 MG tablet Take 250 mg by mouth 2 (two) times daily.   Yes [provider]    Physical Exam: Vitals:   08/19/17 1750 08/19/17 1813 08/19/17 1815 08/19/17 1845  BP:   94/61 (!) 120/55  Pulse:   70   Resp:   (!) 22 (!) 23  Temp:  99.9 F (37.7 C)    TempSrc:  Rectal    SpO2:   (!) 86%   Weight: 59 kg (130 lb)     Height: 5\' 4"  (1.626 m)         Constitutional: Moderately built and nourished. Vitals:   08/19/17 1750 08/19/17 1813 08/19/17 1815 08/19/17 1845  BP:   94/61 (!) 120/55  Pulse:   70   Resp:   (!) 22 (!) 23  Temp:  99.9 F (37.7 C)    TempSrc:  Rectal    SpO2:   (!) 86%   Weight: 59 kg (130 lb)     Height: 5\' 4"  (1.626 m)      Eyes: Anicteric no pallor. ENMT: No discharge from the ears eyes nose or mouth. Neck: No neck rigidity no mass felt. Respiratory: No rhonchi or crepitations. Cardiovascular: S1-S2 heard no murmurs appreciated. Abdomen: Soft nontender bowel  sounds present. Musculoskeletal: Multiple ecchymotic areas present.  No joint effusion.  No edema. Skin: Multiple ecchymotic areas present. Neurologic: Patient is very confused does not follow commands moves all extremities. Psychiatric: Very confused.   Labs on Admission: I have personally reviewed following labs and imaging studies  CBC: Recent Labs  Lab 08/19/17 1807 08/19/17 1810 08/19/17 2128  WBC 14.3*  --  13.5*  NEUTROABS 12.7*  --   --   HGB 9.7* 9.5*  8.9*  HCT 30.6* 28.0* 27.6*  MCV 87.9  --  88.5  PLT 192  --  213   Basic Metabolic Panel: Recent Labs  Lab 08/19/17 1807 08/19/17 1810 08/19/17 1837  NA 142 142  --   K 2.8* 2.8*  --   CL 102 103  --   CO2 23  --   --   GLUCOSE 121* 122*  --   BUN 51* 42*  --   CREATININE 1.85* 1.80*  --   CALCIUM 9.3  --   --   MG  --   --  2.7*   GFR: Estimated Creatinine Clearance: 23.7 mL/min (A) (by C-G formula based on SCr of 1.8 mg/dL (H)). Liver Function Tests: Recent Labs  Lab 08/19/17 1807  AST 121*  ALT 43  ALKPHOS 61  BILITOT 1.0  PROT 6.0*  ALBUMIN 3.6   No results for input(s): LIPASE, AMYLASE in the last 168 hours. No results for input(s): AMMONIA in the last 168 hours. Coagulation Profile: Recent Labs  Lab 08/19/17 1807  INR 1.10   Cardiac Enzymes: Recent Labs  Lab 08/19/17 1807  CKTOTAL 5,453*   BNP (last 3 results) No results for input(s): PROBNP in the last 8760 hours. HbA1C: No results for input(s): HGBA1C in the last 72 hours. CBG: Recent Labs  Lab 08/19/17 1756  GLUCAP 106*   Lipid Profile: No results for input(s): CHOL, HDL, LDLCALC, TRIG, CHOLHDL, LDLDIRECT in the last 72 hours. Thyroid Function Tests: No results for input(s): TSH, T4TOTAL, FREET4, T3FREE, THYROIDAB in the last 72 hours. Anemia Panel: Recent Labs    08/19/17 2128  RETICCTPCT 1.9   Urine analysis:    Component Value Date/Time   COLORURINE AMBER (A) 08/19/2017 1835   APPEARANCEUR CLOUDY (A)  08/19/2017 1835   LABSPEC 1.023 08/19/2017 1835   PHURINE 5.0 08/19/2017 1835   GLUCOSEU NEGATIVE 08/19/2017 1835   HGBUR LARGE (A) 08/19/2017 1835   BILIRUBINUR NEGATIVE 08/19/2017 [place] 08/19/2017 1835   PROTEINUR 30 (A) 08/19/2017 1835   UROBILINOGEN 1 06/09/2014 1531   NITRITE NEGATIVE 08/19/2017 [place] 08/19/2017 1835   Sepsis Labs: @LABRCNTIP (procalcitonin:4,lacticidven:4) )No results found for this or any previous visit (from the past 240 hour(s)).   Radiological Exams on Admission: Ct Head Wo Contrast  Result Date: 08/19/2017 CLINICAL DATA:  72 year old female found on floor by family. Last seen 3 days ago. Initial encounter. EXAM: CT HEAD WITHOUT CONTRAST CT CERVICAL SPINE WITHOUT CONTRAST TECHNIQUE: Multidetector CT imaging of the head and cervical spine was performed following the standard protocol without intravenous contrast. Multiplanar CT image reconstructions of the cervical spine were also generated. COMPARISON:  07/02/2017 MR. 07/01/2016 CT head and cervical spine FINDINGS: CT HEAD FINDINGS Brain: Exam is motion degraded. Resolution of previously noted left-sided chronic subdural hematoma. No new intracranial hemorrhage noted. Chronic microvascular changes without CT evidence of large acute infarct. Global atrophy. No intracranial mass lesion noted on this unenhanced exam. Vascular: Vascular calcifications Skull: No skull fracture Sinuses/Orbits: No acute orbital abnormality. Visualized paranasal sinuses are clear. Mastoid air cells and middle ear cavities are clear. Temporomandibular joint degenerative changes. Other: Left parietal scalp hematoma. CT CERVICAL SPINE FINDINGS Alignment: Curvature cervical spine convex left. Skull base and vertebrae: No cervical spine fracture noted. Soft tissues and spinal canal: No abnormal prevertebral soft tissue swelling. Disc levels: Multilevel cervical spondylotic changes most prominent C5-6 level.  Upper chest: Scarring lung apices. Other: Bilateral carotid bifurcation calcifications. IMPRESSION: Exams  are motion degraded. Interval clearing of previously noted left-sided chronic subdural hematoma. No new intracranial hemorrhage noted. Chronic microvascular changes without CT evidence of large acute infarct. Atrophy. No cervical spine fracture detected. Curvature cervical spine convex left. No abnormal prevertebral soft tissue swelling. Cervical spondylotic changes similar to prior exam. Electronically Signed   By: Genia Del M.D.   On: 08/19/2017 19:47   Ct Cervical Spine Wo Contrast  Result Date: 08/19/2017 CLINICAL DATA:  72 year old female found on floor by family. Last seen 3 days ago. Initial encounter. EXAM: CT HEAD WITHOUT CONTRAST CT CERVICAL SPINE WITHOUT CONTRAST TECHNIQUE: Multidetector CT imaging of the head and cervical spine was performed following the standard protocol without intravenous contrast. Multiplanar CT image reconstructions of the cervical spine were also generated. COMPARISON:  07/02/2017 MR. 07/01/2016 CT head and cervical spine FINDINGS: CT HEAD FINDINGS Brain: Exam is motion degraded. Resolution of previously noted left-sided chronic subdural hematoma. No new intracranial hemorrhage noted. Chronic microvascular changes without CT evidence of large acute infarct. Global atrophy. No intracranial mass lesion noted on this unenhanced exam. Vascular: Vascular calcifications Skull: No skull fracture Sinuses/Orbits: No acute orbital abnormality. Visualized paranasal sinuses are clear. Mastoid air cells and middle ear cavities are clear. Temporomandibular joint degenerative changes. Other: Left parietal scalp hematoma. CT CERVICAL SPINE FINDINGS Alignment: Curvature cervical spine convex left. Skull base and vertebrae: No cervical spine fracture noted. Soft tissues and spinal canal: No abnormal prevertebral soft tissue swelling. Disc levels: Multilevel cervical spondylotic changes  most prominent C5-6 level. Upper chest: Scarring lung apices. Other: Bilateral carotid bifurcation calcifications. IMPRESSION: Exams are motion degraded. Interval clearing of previously noted left-sided chronic subdural hematoma. No new intracranial hemorrhage noted. Chronic microvascular changes without CT evidence of large acute infarct. Atrophy. No cervical spine fracture detected. Curvature cervical spine convex left. No abnormal prevertebral soft tissue swelling. Cervical spondylotic changes similar to prior exam. Electronically Signed   By: Genia Del M.D.   On: 08/19/2017 19:47   Dg Chest Port 1 View  Result Date: 08/19/2017 CLINICAL DATA:  Found down today. EXAM: PORTABLE CHEST 1 VIEW COMPARISON:  Chest radiograph July 01, 2017 FINDINGS: Cardiac silhouette is mildly enlarged and unchanged. Calcified aortic knob. Chronic interstitial changes without pleural effusion or focal consolidation. No pneumothorax. Stable apical pleural thickening. Osteopenia. Severe LEFT shoulder osteoarthrosis, incompletely imaged. IMPRESSION: Stable cardiomegaly and chronic interstitial changes. Aortic Atherosclerosis (ICD10-I70.0). Electronically Signed   By: Elon Alas M.D.   On: 08/19/2017 18:26    EKG: Independently reviewed.  Normal sinus rhythm with incomplete left bundle branch block.  QTC is 598 ms.  Assessment/Plan Principal Problem:   Acute encephalopathy Active Problems:   Bipolar depression ([place])   ARF (acute renal failure) ([facility])   Hypokalemia   Rhabdomyolysis    1. Acute encephalopathy -cause not clear.  Discussed with on-call neurologist and also on-call pulmonary critical care.  As per the neurology CTA findings are nothing acute.  MRI brain has been ordered and will try to obtain once patient is more sedated.  Unable to lumbar puncture due to agitation.  Patient is started empirically on acyclovir for vancomycin ceftriaxone and ampicillin for meningitis/encephalitis coverage.  Follow  cultures.  UA shows possibility of UTI.  Since patient gets easily agitated on exam or any intervention neurologist feels patient does not have any active seizures.  However will check EEG.  Urine drug screen is positive for benzodiazepine.  Patient does take Xanax at home.  Patient will be closely observed in  stepdown. 2. Possible GI bleed  -patient placed on Protonix IV.  Follow CBC.  Consult gastroenterologist in a.m. 3. Acute renal failure with hypokalemia likely from poor oral intake.  Continue with aggressive hydration and repeat placement of potassium.  Closely follow intake output and metabolic panel. 4. Rhabdomyolysis likely from fall.  Hold statins.  Continue with hydration follow CK levels. 5. History of bipolar disorder -patient's family states that patient does take over-the-counter antihistamines otherwise does not take any other medications.  Presently n.p.o. due to acute encephalopathy. 6. Normocytic normochromic anemia -concerning for GI bleed.  Check anemia panel follow CBC.  No signs of any hemolytic process. 7. History of prediabetes.  Presently n.p.o.   DVT prophylaxis: SCDs. Code Status: Full code. Family Communication: Patient's brother and niece. Disposition Plan: To be determined. Consults called: Discussed with neurologist and pulmonary critical care. Admission status: Inpatient.   Rise Patience MD [facility] Pager [phone].  If 7PM-7AM, please contact night-coverage [email or web address] Password [email or web address]  08/19/2017, 10:16 PM

## 2017-08-19 NOTE — Progress Notes (Signed)
Pharmacy Antibiotic Note  Angelica Kelley is a 72 y.o. female admitted on 08/19/2017 with sepsis.  Pharmacy has been consulted for zosyn and vancomycin dosing. Pt here with AMS. WBC 14.3. LA 1.37. SCr 1.85 (BL ~ 1). CrCl ~ 30 mL/min   Plan: -Zosyn 3.375 gm IV once over 30 minutes, then Zosyn 3.375 gm IV Q 8 hours (EI infusion) -Vancomycin 1 gm IV now, then vancomycin 750 mg IV Q 24 hours  -Monitor CBC, renal fx, cultures and clinical progress -VT at SS   Height: 5\' 4"  (162.6 cm) Weight: 130 lb (59 kg) IBW/kg (Calculated) : 54.7  Temp (24hrs), Avg:99.9 F (37.7 C), Min:99.9 F (37.7 C), Max:99.9 F (37.7 C)  No results for input(s): WBC, CREATININE, LATICACIDVEN, VANCOTROUGH, VANCOPEAK, VANCORANDOM, GENTTROUGH, GENTPEAK, GENTRANDOM, TOBRATROUGH, TOBRAPEAK, TOBRARND, AMIKACINPEAK, AMIKACINTROU, AMIKACIN in the last 168 hours.  Estimated Creatinine Clearance: 42.6 mL/min (A) (by C-G formula based on SCr of 1 mg/dL (H)).    Allergies  Allergen Reactions  . Lipitor [Atorvastatin]     Fatigue  . Prednisone Other (See Comments)    Change in mental status    Antimicrobials this admission: Vanc 4/8 >>  Zosyn 4/8 >>   Dose adjustments this admission: None   Microbiology results: 4/8 BCx:   Thank you for allowing pharmacy to be a part of this patient's care.  Albertina Parr, PharmD., BCPS Clinical Pharmacist Clinical phone for 08/19/17 until 11pm: [phone] If after 11pm, please call main pharmacy at: [phone]

## 2017-08-19 NOTE — ED Notes (Signed)
Pt demonstrating erratic, fidgeting behavior, confusion, pulling at wires/equipment; pt reporting she wants out of bed however she is a huge fall risk; family at bedside attempting to console

## 2017-08-19 NOTE — Progress Notes (Signed)
Pharmacy Antibiotic Note  Angelica Kelley is a 72 y.o. female admitted on 08/19/2017 with meningitis.  Pharmacy has been consulted for vancomycin, ceftriaxone, and acyclovir dosing.  Pt presented with AMS. WBC 14.3. LA 1.37. SCr 1.85 (BL ~ 1). CrCl ~ 30 mL/min   Given vancomycin load and dose of Zosyn in ED.  Plan: Vancomycin 750 mg IV every 24 hours.  Goal trough 15-20 mcg/mL. Ceftriaxone 2 g IV every 12 hours. Acyclovir 600 mg every 12 hours. Ampicillin 2 g every 8 hours. F/U cultures, LOT, renal function, and vanc trough prn.  Height: 5\' 4"  (162.6 cm) Weight: 130 lb (59 kg) IBW/kg (Calculated) : 54.7  Temp (24hrs), Avg:99.9 F (37.7 C), Min:99.9 F (37.7 C), Max:99.9 F (37.7 C)  Recent Labs  Lab 08/19/17 1807 08/19/17 1810 08/19/17 2128 08/19/17 2143  WBC 14.3*  --  13.5*  --   CREATININE 1.85* 1.80*  --   --   LATICACIDVEN  --  1.37  --  1.74    Estimated Creatinine Clearance: 23.7 mL/min (A) (by C-G formula based on SCr of 1.8 mg/dL (H)).    Allergies  Allergen Reactions  . Lipitor [Atorvastatin]     Fatigue  . Prednisone Other (See Comments)    Change in mental status    Antimicrobials this admission: Zosyn 4/8 >> 4/8 Vancomycin 4/8 >> Ceftriaxone 4/8 >> Acyclovir 4/8 >> Ampicillin 4/8 >>  Dose adjustments this admission: none  Microbiology results: 4/8 BCx: sent 4/8 UCx: sent  Thank you for allowing pharmacy to be a part of this patient's care.  Blaine Hamper Deaisha Welborn 08/19/2017 10:27 PM

## 2017-08-19 NOTE — ED Notes (Signed)
Patient transported to CT 

## 2017-08-19 NOTE — ED Notes (Signed)
Pt on 2L [place].  

## 2017-08-19 NOTE — ED Notes (Signed)
Pt asking for pain medicine, Informed Sara-RN.

## 2017-08-19 NOTE — ED Triage Notes (Signed)
Pt BIB ems , pt was found on floor by family today, she was last seen on Friday, family had not heard from her so they went to check on her and she was in the floor. Pt responds to voice and follows commands, bruising and skin tears noted all over her body, dried blood noted in her mouth. Pupils equal and reactive.  BP 92/54 Hr 64 SpO2 96% on room air

## 2017-08-19 NOTE — ED Notes (Signed)
Portable xray at bedside.

## 2017-08-20 ENCOUNTER — Inpatient Hospital Stay (HOSPITAL_COMMUNITY): Payer: Medicare Other

## 2017-08-20 ENCOUNTER — Other Ambulatory Visit: Payer: Self-pay

## 2017-08-20 ENCOUNTER — Encounter (HOSPITAL_COMMUNITY): Payer: Self-pay | Admitting: *Deleted

## 2017-08-20 DIAGNOSIS — I361 Nonrheumatic tricuspid (valve) insufficiency: Secondary | ICD-10-CM

## 2017-08-20 DIAGNOSIS — G9341 Metabolic encephalopathy: Secondary | ICD-10-CM

## 2017-08-20 DIAGNOSIS — M6282 Rhabdomyolysis: Secondary | ICD-10-CM

## 2017-08-20 DIAGNOSIS — G9340 Encephalopathy, unspecified: Secondary | ICD-10-CM

## 2017-08-20 DIAGNOSIS — D62 Acute posthemorrhagic anemia: Secondary | ICD-10-CM

## 2017-08-20 DIAGNOSIS — K922 Gastrointestinal hemorrhage, unspecified: Secondary | ICD-10-CM

## 2017-08-20 LAB — BASIC METABOLIC PANEL
ANION GAP: 10 (ref 5–15)
Anion gap: 13 (ref 5–15)
BUN: 35 mg/dL — AB (ref 6–20)
BUN: 27 mg/dL — AB (ref 6–20)
CALCIUM: 8.6 mg/dL — AB (ref 8.9–10.3)
CHLORIDE: 113 mmol/L — AB (ref 101–111)
CO2: 20 mmol/L — ABNORMAL LOW (ref 22–32)
CO2: 20 mmol/L — ABNORMAL LOW (ref 22–32)
CREATININE: 1.04 mg/dL — AB (ref 0.44–1.00)
Calcium: 8.1 mg/dL — ABNORMAL LOW (ref 8.9–10.3)
Chloride: 113 mmol/L — ABNORMAL HIGH (ref 101–111)
Creatinine, Ser: 1.21 mg/dL — ABNORMAL HIGH (ref 0.44–1.00)
GFR calc Af Amer: 60 mL/min — ABNORMAL LOW (ref >60)
GFR calc non Af Amer: 43 mL/min — ABNORMAL LOW (ref >60)
GFR calc non Af Amer: 52 mL/min — ABNORMAL LOW (ref >60)
GFR, EST AFRICAN AMERICAN: 50 mL/min — AB (ref >60)
GLUCOSE: 93 mg/dL (ref 65–99)
Glucose, Bld: 94 mg/dL (ref 65–99)
POTASSIUM: 2.4 mmol/L — AB (ref 3.5–5.1)
Potassium: 2.5 mmol/L — CL (ref 3.5–5.1)
SODIUM: 143 mmol/L (ref 135–145)
Sodium: 146 mmol/L — ABNORMAL HIGH (ref 135–145)

## 2017-08-20 LAB — CBC
HCT: 25.7 % — ABNORMAL LOW (ref 36.0–46.0)
HCT: 26.8 % — ABNORMAL LOW (ref 36.0–46.0)
HCT: 28.6 % — ABNORMAL LOW (ref 36.0–46.0)
HEMATOCRIT: 26.5 % — AB (ref 36.0–46.0)
HEMOGLOBIN: 9 g/dL — AB (ref 12.0–15.0)
Hemoglobin: 8.2 g/dL — ABNORMAL LOW (ref 12.0–15.0)
Hemoglobin: 8.2 g/dL — ABNORMAL LOW (ref 12.0–15.0)
Hemoglobin: 8.7 g/dL — ABNORMAL LOW (ref 12.0–15.0)
MCH: 27.5 pg (ref 26.0–34.0)
MCH: 28.4 pg (ref 26.0–34.0)
MCH: 29.2 pg (ref 26.0–34.0)
MCH: 28.1 pg (ref 26.0–34.0)
MCHC: 30.9 g/dL (ref 30.0–36.0)
MCHC: 31.9 g/dL (ref 30.0–36.0)
MCHC: 32.5 g/dL (ref 30.0–36.0)
MCHC: 31.5 g/dL (ref 30.0–36.0)
MCV: 88.9 fL (ref 78.0–100.0)
MCV: 88.9 fL (ref 78.0–100.0)
MCV: 89.9 fL (ref 78.0–100.0)
MCV: 89.4 fL (ref 78.0–100.0)
PLATELETS: 163 10*3/uL (ref 150–400)
Platelets: 158 10*3/uL (ref 150–400)
Platelets: 165 10*3/uL (ref 150–400)
Platelets: 177 10*3/uL (ref 150–400)
RBC: 2.98 MIL/uL — ABNORMAL LOW (ref 3.87–5.11)
RBC: 2.89 MIL/uL — ABNORMAL LOW (ref 3.87–5.11)
RBC: 2.98 MIL/uL — ABNORMAL LOW (ref 3.87–5.11)
RBC: 3.2 MIL/uL — AB (ref 3.87–5.11)
RDW: 13.8 % (ref 11.5–15.5)
RDW: 13.9 % (ref 11.5–15.5)
RDW: 14.2 % (ref 11.5–15.5)
RDW: 14 % (ref 11.5–15.5)
WBC: 10.3 10*3/uL (ref 4.0–10.5)
WBC: 9.6 10*3/uL (ref 4.0–10.5)
WBC: 9 10*3/uL (ref 4.0–10.5)
WBC: 10.9 10*3/uL — ABNORMAL HIGH (ref 4.0–10.5)

## 2017-08-20 LAB — RAPID URINE DRUG SCREEN, HOSP PERFORMED
Amphetamines: NOT DETECTED
BARBITURATES: NOT DETECTED
BENZODIAZEPINES: POSITIVE — AB
Cocaine: NOT DETECTED
Opiates: NOT DETECTED
Tetrahydrocannabinol: NOT DETECTED

## 2017-08-20 LAB — CK: CK TOTAL: 6984 U/L — AB (ref 38–234)

## 2017-08-20 LAB — ECHOCARDIOGRAM COMPLETE
HEIGHTINCHES: 64 in
WEIGHTICAEL: 2080 [oz_av]

## 2017-08-20 LAB — URINE CULTURE: Culture: NO GROWTH

## 2017-08-20 LAB — TROPONIN I: TROPONIN I: 0.05 ng/mL — AB (ref <0.03)

## 2017-08-20 LAB — CBG MONITORING, ED
Glucose-Capillary: 190 mg/dL — ABNORMAL HIGH (ref 65–99)
Glucose-Capillary: 96 mg/dL (ref 65–99)
Glucose-Capillary: 87 mg/dL (ref 65–99)
Glucose-Capillary: 86 mg/dL (ref 65–99)

## 2017-08-20 LAB — ABO/RH: ABO/RH(D): A POS

## 2017-08-20 LAB — MRSA PCR SCREENING: MRSA by PCR: NEGATIVE

## 2017-08-20 LAB — GLUCOSE, CAPILLARY: GLUCOSE-CAPILLARY: 75 mg/dL (ref 65–99)

## 2017-08-20 MED ORDER — HALOPERIDOL LACTATE 5 MG/ML IJ SOLN
2.0000 mg | Freq: Four times a day (QID) | INTRAMUSCULAR | Status: DC | PRN
  Administered 2017-08-20: 2 mg via INTRAVENOUS
  Filled 2017-08-20: qty 1

## 2017-08-20 MED ORDER — SODIUM CHLORIDE 0.9 % IV SOLN
2.0000 g | Freq: Two times a day (BID) | INTRAVENOUS | Status: DC
  Administered 2017-08-20 – 2017-08-22 (×5): 2 g via INTRAVENOUS
  Filled 2017-08-20 (×6): qty 20

## 2017-08-20 MED ORDER — POTASSIUM CHLORIDE 10 MEQ/100ML IV SOLN
10.0000 meq | INTRAVENOUS | Status: AC
  Administered 2017-08-20 (×3): 10 meq via INTRAVENOUS
  Filled 2017-08-20 (×3): qty 100

## 2017-08-20 MED ORDER — SODIUM CHLORIDE 0.9 % IV SOLN
2.0000 g | Freq: Three times a day (TID) | INTRAVENOUS | Status: DC
  Administered 2017-08-20 – 2017-08-21 (×3): 2 g via INTRAVENOUS
  Filled 2017-08-20 (×4): qty 2000

## 2017-08-20 MED ORDER — HYDRALAZINE HCL 20 MG/ML IJ SOLN: 10.0000 mg | INTRAMUSCULAR | Status: DC | PRN

## 2017-08-20 MED ORDER — POTASSIUM CHLORIDE 10 MEQ/100ML IV SOLN
10.0000 meq | INTRAVENOUS | Status: AC
  Administered 2017-08-20 – 2017-08-21 (×4): 10 meq via INTRAVENOUS
  Filled 2017-08-20 (×4): qty 100

## 2017-08-20 MED ORDER — DEXTROSE 5 % IV SOLN
600.0000 mg | Freq: Two times a day (BID) | INTRAVENOUS | Status: DC
  Administered 2017-08-20 – 2017-08-22 (×5): 600 mg via INTRAVENOUS
  Filled 2017-08-20 (×6): qty 12

## 2017-08-20 NOTE — ED Notes (Signed)
Family at bedside. 

## 2017-08-20 NOTE — Progress Notes (Signed)
PROGRESS NOTE  ARDELL AARONSON  OYD:[id number] DOB: May 08, 1946 DOA: 08/19/2017 PCP: Unk Pinto, MD  Outpatient Specialists: GI, Earlean Shawl Brief Narrative: Angelica Kelley is a 72 y.o. female with a history of bipolar disorder, TIA, prediabetes, HTN, HLD who presented to the ED with AMS, found on the ground by her niece with widespread bruising, grossly altered from baseline with dried blood noted from the mouth. On evaluation she has been afebrile, hypoxic and encephalopathic. CT head revealed interval clearing of chronic SDH, no acute findings on atrophic background and C-spine showed no fracture. CXR unremarkable, urinalysis suggestive of UTI. Hgb down from 12.4 in March to 9.7 on arrival and has continued to trend downward to 8.2 with negative FOBT, INR 1.1, no features of hemolysis. Creatinine up from 1 to 1.8 with CK elevated to 6,984. Due to ongoing agitation, ativan was given and soft mittens placed though LP is unobtainable. MRI and EEG are ordered and empiric coverage for meningitis/encephalitis started per ID phone recommendations. GI is consulted.   Assessment & Plan: Principal Problem:   Acute encephalopathy Active Problems:   Bipolar depression ([place])   ARF (acute renal failure) (HCC)   Hypokalemia   Rhabdomyolysis  Acute encephalopathy: Unclear precipitant, last known to be normal 4/5, possible that confusion began 3 days PTA. TSH 1.617, tox panel negative except benzo. Ammonia 16.  - Tx UTI - f/u EEG, MRI; formal neurology consult if abnormality found or if mental status remains abnormal despite treatment.  - If agitation subsides, would pursue LP, currently is unsafe and will treat empirically as below - UDS +benzodiazepines ?toxicity vs. withdrawal - Neuro checks, close observation in SDU (still in ED) - Also has h/o bipolar disorder though more strongly suspect organic cause to encephalopathy currently  Acute blood loss anemia with ?acute upper GI bleeding:  - Trend CBC - Type  & screen performed - IV PPI - Anemia panel/retic not consistent with chronic process or hemolysis or coagulopathy, though does have widespread bruising, presumably due to falling.  - FOBT negative. Will ask for GI evaluation.  Acute renal failure: Suspect dehydration, rhabdomyolysis from prolonged period down, no po intake.  - Continue IVF's and trend metabolic panel to ensure improvement. Replacing potassium cautiously with impaired renal function.   Hypokalemia: Replaced by IV.  - Recheck and replenish prn. Mg 2.7.   Acute hypoxic respiratory failure: Without evidence of pneumonia, volume overload, wheezing. Possibly due to AMS.  - Supplement oxygen as needed  Pyuria: Unable to elicit symptoms.  - Follow up urine culture and blood cultures.  - Treating empirically for CNS infection which would cover UTI.  Rhabdomyolysis:  - Trend CK - Holding statin  Troponin elevation: Mild. 0.05.  - Recheck.  - Check echo (cardiomegaly on CXR)  Bipolar disorder: Noted Prediabetes: Seems to have technically resolved with recent HbA1c 5.4%. Currently NPO.   DVT prophylaxis: SCDs Code Status: Full Family Communication: No family at bedside this AM. Patient's brother and Niece at bedside at admission earlier this morning. Disposition Plan: Uncertain  Consultants:   Neurology and PCCM by phone only at admission  [facility] (pt's GI is Dr. Earlean Shawl)  Procedures:   None  Antimicrobials:  Ceftriaxone, vancomycin, ampicillin, acyclovir  Subjective: UTD. Pt has remained agitated with any interventions overnight and this morning.  Objective: Vitals:   08/20/17 0320 08/20/17 0415 08/20/17 0524 08/20/17 0653  BP: 110/69 (!) 101/53 (!) 100/54 106/72  Pulse: 94 73 69 73  Resp: 16 18 18  20  Temp:      TempSrc:      SpO2: 94% 100% 96% 100%  Weight:      Height:        Intake/Output Summary (Last 24 hours) at 08/20/2017 0905 Last data filed at 08/20/2017 0357 Gross per 24 hour  Intake  2100 ml  Output -  Net 2100 ml   Filed Weights   08/19/17 1750  Weight: 59 kg (130 lb)    Gen: Elderly, restless female in no distress HEENT: Oropharynx with dried dark blood on tongue, palate and lips without mucosal ulceration or laceration or red bleeding. Nasopharynx unremarkable. Opens eyes briefly on command and has reactive pupils.  Neck: Supple, no rigidity.  Pulm: Non-labored breathing 2LPM. Clear to auscultation laterally and anteriorly.  CV: Regular rate and rhythm. No murmur, rub, or gallop. No JVD, no pedal edema. GI: Abdomen soft, no grimace with palpation, not distended, +BS.   Ext: Warm, no deformities Skin: Some scabbed wounds on forearms with ecchymoses diffusely on extremities without particular pattern without active bleeding or fluctuance.  Neuro: Agitated with verbal or tactile stimulus. Not cooperative with exam but moves all extremities spontaneously and when being palpated. No tremor. Psych: UTD  Data Reviewed: I have personally reviewed following labs and imaging studies  CBC: Recent Labs  Lab 08/19/17 1807 08/19/17 1810 08/19/17 2128 08/20/17 0357  WBC 14.3*  --  13.5* 10.3  NEUTROABS 12.7*  --   --   --   HGB 9.7* 9.5* 8.9* 8.2*  HCT 30.6* 28.0* 27.6* 26.5*  MCV 87.9  --  88.5 88.9  PLT 192  --  177 749   Basic Metabolic Panel: Recent Labs  Lab 08/19/17 1807 08/19/17 1810 08/19/17 1837  NA 142 142  --   K 2.8* 2.8*  --   CL 102 103  --   CO2 23  --   --   GLUCOSE 121* 122*  --   BUN 51* 42*  --   CREATININE 1.85* 1.80*  --   CALCIUM 9.3  --   --   MG  --   --  2.7*   GFR: Estimated Creatinine Clearance: 23.7 mL/min (A) (by C-G formula based on SCr of 1.8 mg/dL (H)). Liver Function Tests: Recent Labs  Lab 08/19/17 1807  AST 121*  ALT 43  ALKPHOS 61  BILITOT 1.0  PROT 6.0*  ALBUMIN 3.6   No results for input(s): LIPASE, AMYLASE in the last 168 hours. Recent Labs  Lab 08/19/17 2128  AMMONIA 16   Coagulation  Profile: Recent Labs  Lab 08/19/17 1807  INR 1.10   Cardiac Enzymes: Recent Labs  Lab 08/19/17 1807 08/19/17 2128 08/20/17 0357  CKTOTAL 5,453*  --  6,984*  TROPONINI  --  0.05*  --    BNP (last 3 results) No results for input(s): PROBNP in the last 8760 hours. HbA1C: No results for input(s): HGBA1C in the last 72 hours. CBG: Recent Labs  Lab 08/19/17 1756 08/19/17 2350 08/20/17 0423  GLUCAP 106* 125* 190*   Lipid Profile: No results for input(s): CHOL, HDL, LDLCALC, TRIG, CHOLHDL, LDLDIRECT in the last 72 hours. Thyroid Function Tests: Recent Labs    08/19/17 2128  TSH 1.617   Anemia Panel: Recent Labs    08/19/17 2128  VITAMINB12 1,042*  FOLATE 11.0  FERRITIN 119  TIBC 291  IRON 31  RETICCTPCT 1.9   Urine analysis:    Component Value Date/Time   COLORURINE AMBER (A) 08/19/2017 1835  APPEARANCEUR CLOUDY (A) 08/19/2017 1835   LABSPEC 1.023 08/19/2017 1835   PHURINE 5.0 08/19/2017 1835   GLUCOSEU NEGATIVE 08/19/2017 1835   HGBUR LARGE (A) 08/19/2017 [place] 08/19/2017 [place] 08/19/2017 1835   PROTEINUR 30 (A) 08/19/2017 1835   UROBILINOGEN 1 06/09/2014 1531   NITRITE NEGATIVE 08/19/2017 1835   LEUKOCYTESUR NEGATIVE 08/19/2017 1835   No results found for this or any previous visit (from the past 240 hour(s)).    Radiology Studies: Ct Head Wo Contrast  Result Date: 08/19/2017 CLINICAL DATA:  71 year old female found on floor by family. Last seen 3 days ago. Initial encounter. EXAM: CT HEAD WITHOUT CONTRAST CT CERVICAL SPINE WITHOUT CONTRAST TECHNIQUE: Multidetector CT imaging of the head and cervical spine was performed following the standard protocol without intravenous contrast. Multiplanar CT image reconstructions of the cervical spine were also generated. COMPARISON:  07/02/2017 MR. 07/01/2016 CT head and cervical spine FINDINGS: CT HEAD FINDINGS Brain: Exam is motion degraded. Resolution of previously noted  left-sided chronic subdural hematoma. No new intracranial hemorrhage noted. Chronic microvascular changes without CT evidence of large acute infarct. Global atrophy. No intracranial mass lesion noted on this unenhanced exam. Vascular: Vascular calcifications Skull: No skull fracture Sinuses/Orbits: No acute orbital abnormality. Visualized paranasal sinuses are clear. Mastoid air cells and middle ear cavities are clear. Temporomandibular joint degenerative changes. Other: Left parietal scalp hematoma. CT CERVICAL SPINE FINDINGS Alignment: Curvature cervical spine convex left. Skull base and vertebrae: No cervical spine fracture noted. Soft tissues and spinal canal: No abnormal prevertebral soft tissue swelling. Disc levels: Multilevel cervical spondylotic changes most prominent C5-6 level. Upper chest: Scarring lung apices. Other: Bilateral carotid bifurcation calcifications. IMPRESSION: Exams are motion degraded. Interval clearing of previously noted left-sided chronic subdural hematoma. No new intracranial hemorrhage noted. Chronic microvascular changes without CT evidence of large acute infarct. Atrophy. No cervical spine fracture detected. Curvature cervical spine convex left. No abnormal prevertebral soft tissue swelling. Cervical spondylotic changes similar to prior exam. Electronically Signed   By: Genia Del M.D.   On: 08/19/2017 19:47   Ct Cervical Spine Wo Contrast  Result Date: 08/19/2017 CLINICAL DATA:  72 year old female found on floor by family. Last seen 3 days ago. Initial encounter. EXAM: CT HEAD WITHOUT CONTRAST CT CERVICAL SPINE WITHOUT CONTRAST TECHNIQUE: Multidetector CT imaging of the head and cervical spine was performed following the standard protocol without intravenous contrast. Multiplanar CT image reconstructions of the cervical spine were also generated. COMPARISON:  07/02/2017 MR. 07/01/2016 CT head and cervical spine FINDINGS: CT HEAD FINDINGS Brain: Exam is motion degraded.  Resolution of previously noted left-sided chronic subdural hematoma. No new intracranial hemorrhage noted. Chronic microvascular changes without CT evidence of large acute infarct. Global atrophy. No intracranial mass lesion noted on this unenhanced exam. Vascular: Vascular calcifications Skull: No skull fracture Sinuses/Orbits: No acute orbital abnormality. Visualized paranasal sinuses are clear. Mastoid air cells and middle ear cavities are clear. Temporomandibular joint degenerative changes. Other: Left parietal scalp hematoma. CT CERVICAL SPINE FINDINGS Alignment: Curvature cervical spine convex left. Skull base and vertebrae: No cervical spine fracture noted. Soft tissues and spinal canal: No abnormal prevertebral soft tissue swelling. Disc levels: Multilevel cervical spondylotic changes most prominent C5-6 level. Upper chest: Scarring lung apices. Other: Bilateral carotid bifurcation calcifications. IMPRESSION: Exams are motion degraded. Interval clearing of previously noted left-sided chronic subdural hematoma. No new intracranial hemorrhage noted. Chronic microvascular changes without CT evidence of large acute infarct. Atrophy. No cervical spine fracture  detected. Curvature cervical spine convex left. No abnormal prevertebral soft tissue swelling. Cervical spondylotic changes similar to prior exam. Electronically Signed   By: Genia Del M.D.   On: 08/19/2017 19:47   Dg Chest Port 1 View  Result Date: 08/19/2017 CLINICAL DATA:  Found down today. EXAM: PORTABLE CHEST 1 VIEW COMPARISON:  Chest radiograph July 01, 2017 FINDINGS: Cardiac silhouette is mildly enlarged and unchanged. Calcified aortic knob. Chronic interstitial changes without pleural effusion or focal consolidation. No pneumothorax. Stable apical pleural thickening. Osteopenia. Severe LEFT shoulder osteoarthrosis, incompletely imaged. IMPRESSION: Stable cardiomegaly and chronic interstitial changes. Aortic Atherosclerosis (ICD10-I70.0).  Electronically Signed   By: Elon Alas M.D.   On: 08/19/2017 18:26    Scheduled Meds: . pantoprazole (PROTONIX) IV  40 mg Intravenous Q12H  . thiamine injection  100 mg Intravenous Daily   Continuous Infusions: . sodium chloride 150 mL/hr at 08/20/17 0054  . acyclovir Stopped (08/20/17 0235)  . ampicillin (OMNIPEN) IV Stopped (08/20/17 0324)  . cefTRIAXone (ROCEPHIN)  IV Stopped (08/20/17 0235)  . vancomycin       LOS: 1 day   Time spent: 35 minutes.  Vance Gather, MD [facility] Pager [phone]  If 7PM-7AM, please contact night-coverage [email or web address] Password [email or web address] 08/20/2017, 9:05 AM

## 2017-08-20 NOTE — Evaluation (Signed)
Physical Therapy Evaluation Patient Details Name: Angelica Kelley MRN: [id number] DOB: April 13, 1946 Today's Date: 08/20/2017   History of Present Illness  Pt is a 72 y/o female admitted after being found down in home and also with acute encephalopathy. CT of head showed interval clearing of L subdural hematoma and CT of C spine negative for acute abnormality. PMH includes bipolar disorder, TIA, pre-diabetes, and HTN.   Clinical Impression  Pt admitted secondary to problem above with deficits below. Pt presenting with cognitive deficits, however, able to follow some commands to move extremities. Pt required total assist for bed mobility, and did not attempt further mobility as no 2nd person able to assist. Pt with multiple bruises along extremities, and per notes, pt found down on floor, so assume pt has fallen at home. Unable to obtain PLOF given garbled speech, however, per RN pt lives alone. Feel pt is a high fall risk and recommending post acute SNF prior to return home. Will continue to follow acutely to maximize functional mobility independence and safety.      Follow Up Recommendations SNF;Supervision/Assistance - 24 hour    Equipment Recommendations  None recommended by PT    Recommendations for Other Services       Precautions / Restrictions Precautions Precautions: Fall;Other (comment) Precaution Comments: Pt with bilat safety mits on hands.  Restrictions Weight Bearing Restrictions: No      Mobility  Bed Mobility Overal bed mobility: Needs Assistance Bed Mobility: Rolling Rolling: Total assist         General bed mobility comments: Pt unable to assist with basic bed mobility tasks and required total assist to roll to L and R and for repositioning in bed.   Transfers                 General transfer comment: Unsafe to attempt without 2nd person.   Ambulation/Gait                Stairs            Wheelchair Mobility    Modified Rankin (Stroke  Patients Only)       Balance                                             Pertinent Vitals/Pain Pain Assessment: Faces Faces Pain Scale: Hurts little more Pain Location: Extremities with movement  Pain Descriptors / Indicators: Grimacing Pain Intervention(s): Limited activity within patient's tolerance;Repositioned;Monitored during session    Home Living Family/patient expects to be discharged to:: Private residence Living Arrangements: Alone Available Help at Discharge: Family;Available PRN/intermittently Type of Home: Mobile home Home Access: Stairs to enter Entrance Stairs-Rails: Right Entrance Stairs-Number of Steps: 4 Home Layout: One level Home Equipment: None Additional Comments: Information from previous session. Pt with garbled speech and unable to answer questions about home. Per RN, pt from home and was previously independent. Will need to follow up with family to see if info correct.    Prior Function Level of Independence: Independent         Comments: Per RN, pt was independent with mobility and lived alone. Pt unable to give PLOF      Hand Dominance        Extremity/Trunk Assessment   Upper Extremity Assessment Upper Extremity Assessment: Defer to OT evaluation(Pt with bruises over UEs. Pt able to follow commands to lift UEs)  Lower Extremity Assessment Lower Extremity Assessment: LLE deficits/detail;RLE deficits/detail RLE Deficits / Details: Pt able to follow commands to move ankles. Required assist to bend knees and hips in supine. Pt with multiple bruises along LEs.  LLE Deficits / Details: Pt able to follow commands to move ankles. Required assist to bend knees and hips in supine. Pt with multiple bruises along LEs.        Communication   Communication: Other (comment)(pt with very garbled speech. Difficulty understanding)  Cognition Arousal/Alertness: Lethargic Behavior During Therapy: WFL for tasks  assessed/performed Overall Cognitive Status: Difficult to assess                                 General Comments: Pt with eyes closed throughout session and did not open upon command, however, talking throughout, with garbled speech       General Comments General comments (skin integrity, edema, etc.): Per RN, pt's family reports they are concerned that pt unable to take care of herself at home. VSS throughout.    Exercises     Assessment/Plan    PT Assessment Patient needs continued PT services  PT Problem List Decreased strength;Decreased activity tolerance;Decreased balance;Decreased mobility;Decreased cognition;Decreased knowledge of use of DME;Decreased safety awareness;Decreased knowledge of precautions       PT Treatment Interventions DME instruction;Therapeutic activities;Gait training;Therapeutic exercise;Balance training;Functional mobility training;Neuromuscular re-education;Patient/family education    PT Goals (Current goals can be found in the Care Plan section)  Acute Rehab PT Goals PT Goal Formulation: Patient unable to participate in goal setting Time For Goal Achievement: 09/03/17 Potential to Achieve Goals: Fair    Frequency Min 2X/week   Barriers to discharge Decreased caregiver support      Co-evaluation               AM-PAC PT "6 Clicks" Daily Activity  Outcome Measure Difficulty turning over in bed (including adjusting bedclothes, sheets and blankets)?: Unable Difficulty moving from lying on back to sitting on the side of the bed? : Unable Difficulty sitting down on and standing up from a chair with arms (e.g., wheelchair, bedside commode, etc,.)?: Unable Help needed moving to and from a bed to chair (including a wheelchair)?: Total Help needed walking in hospital room?: Total Help needed climbing 3-5 steps with a railing? : Total 6 Click Score: 6    End of Session   Activity Tolerance: Patient limited by lethargy Patient  left: in bed;with call bell/phone within reach(with mits on ) Nurse Communication: Mobility status PT Visit Diagnosis: History of falling (Z91.81);Muscle weakness (generalized) (M62.81)    Time: 2725-3664 PT Time Calculation (min) (ACUTE ONLY): 12 min   Charges:   PT Evaluation $PT Eval Moderate Complexity: 1 Mod     PT G Codes:        Leighton Ruff, PT, DPT  Acute Rehabilitation Services  Pager: [phone]   Rudean Hitt 08/20/2017, 12:23 PM

## 2017-08-20 NOTE — Progress Notes (Signed)
CRITICAL VALUE ALERT  Critical Value:  K+ 2.5  Date & Time Notied:  08/20/17 2310  Provider Notified: [facility] with [facility] Orders Received/Actions taken:MD ordered potassium chloride 58mEq X4

## 2017-08-20 NOTE — Progress Notes (Signed)
  Echocardiogram 2D Echocardiogram has been performed.  Jannett Celestine 08/20/2017, 2:35 PM

## 2017-08-20 NOTE — ED Notes (Signed)
Pt resting; niece [name] at bedside reports that she has been actively trying to get SNF placement. [facility] been coming to home a few days a week but not enough for her. She states not enough for her to live by herself.

## 2017-08-20 NOTE — Progress Notes (Signed)
EEG completed, results pending. 

## 2017-08-20 NOTE — Consult Note (Addendum)
Consultation  Referring Provider: [facility] /GrunzPrimary Care Physician:  Unk Pinto, MD Primary Gastroenterologist:  [facility]  Reason for Consultation:   Question upper GI blood loss  HPI: Angelica Kelley is a 72 y.o. female who was admitted through the emergency room last evening, brought in by EMS after found at home on the floor by her niece with altered mental status and widespread bruising.  There was some dried blood caked around her mouth.  She was afebrile hypoxic and encephalopathic.  She did have some initial agitation. Patient has history of alcohol abuse, bipolar disorder, prior TIA, history of subdural hematoma, anxiety/depression, GERD, hypertension, hyperlipidemia and IBS. She is apparently known to Dr. Dellis Filbert Medoff/gastroenterology and may have had a colonoscopy earlier this month.  I cannot see the report in care everywhere.  She does not have any prior endoscopies listed in the epic system. Workup thus far with CT of the head showed interval clearing of a chronic subdural hematoma no acute findings chronic atrophy.  Chest x-ray was negative.  It was noted that she had had a drop in hemoglobin from about 12.4 earlier this month down to 9.70 on arrival down to 8.2 today.  She was Hemoccult negative in the emergency room.  INR is normal not felt to have any evidence of hemolysis.  Her creatinine is elevated and CK was markedly elevated at 6984. GI is consulted because of question of possible GI blood loss at some point prior to acute presentation question hematemesis.  Patient did not have any blood on her clothing at the time of evaluation in the ER. She is currently somnolent and unable to answer questions.  She is having an EEG currently. She is being treated as an acute encephalopathy, unclear precipitating factor and also being covered for sepsis and rhabdomyolysis. Urine drug screen was positive for benzos.  EtOH level less than 10 on admission.   Past  Medical History:  Diagnosis Date  . Alcoholism ([place])   . Anxiety   . Depression   . Elevated hemoglobin A1c   . Fracture of right wrist   . GERD (gastroesophageal reflux disease)   . Heart murmur   . Hyperlipidemia   . Hypertension   . IBS (irritable bowel syndrome)     Past Surgical History:  Procedure Laterality Date  . ABDOMINAL HYSTERECTOMY  1991  . APPENDECTOMY    . BREAST SURGERY Left 1989   [facility]  . CARPAL TUNNEL RELEASE Right 10/18/2014   Procedure: CARPAL TUNNEL RELEASE;  Surgeon: Dorna Leitz, MD;  Location: [facility];  Service: Orthopedics;  Laterality: Right;  . [place]  . NECK SURGERY  Remote    Ruptured disc, per patient. Got infected.   . ORIF WRIST FRACTURE Right 10/18/2014   Procedure: OPEN REDUCTION INTERNAL FIXATION (ORIF) RIGHT WRIST FRACTURE;  Surgeon: Dorna Leitz, MD;  Location: [facility];  Service: Orthopedics;  Laterality: Right;  . TONSILLECTOMY  1965  . TUBAL LIGATION      Prior to Admission medications   Medication Sig Start Date End Date Taking? Authorizing Provider  ALPRAZolam Duanne Moron) 1 MG tablet Take 1 mg by mouth 2 (two) times daily. For anxiety.    Yes [provider]  aspirin EC 325 MG EC tablet Take 1 tablet (325 mg total) by mouth daily. 05/12/17  Yes Mikhail, Velta Addison, DO  bisoprolol-hydrochlorothiazide [facility]) 5-6.25 MG tablet take 1 tablet by mouth once daily 03/13/17  Yes Unk Pinto, MD  buPROPion (WELLBUTRIN XL) 150  MG 24 hr tablet Take 450 mg by mouth daily.   Yes [provider]  Cholecalciferol (VITAMIN D3) 5000 UNITS CAPS Take 5,000 Units by mouth daily. Only take 5 days  week   Yes [provider]  gabapentin (NEURONTIN) 300 MG capsule Take 300 mg by mouth at bedtime.  10/26/16  Yes [provider]  hydrochlorothiazide (MICROZIDE) 12.5 MG capsule take 1 capsule by mouth EVERY OTHER DAY daily for blood pressure and FLUID Patient taking differently: Take 12.5 mg by mouth daily. take 1  capsule by mouth EVERY OTHER DAY daily for blood pressure and FLUID 05/16/17  Yes Vicie Mutters, PA-C  lamoTRIgine (LAMICTAL) 200 MG tablet Take 400 mg by mouth daily.  04/21/15  Yes [provider]  rosuvastatin (CRESTOR) 40 MG tablet take 1/2 to 1 tablet by mouth once daily Patient taking differently: take 20 MG by mouth once daily 03/13/17  Yes Unk Pinto, MD  sertraline (ZOLOFT) 100 MG tablet Take 200 mg by mouth daily.  04/11/15  Yes [provider]  Thiamine HCl (VITAMIN B-1) 250 MG tablet Take 250 mg by mouth 2 (two) times daily.   Yes [provider]    Current Facility-Administered Medications  Medication Dose Route Frequency Provider Last Rate Last Dose  . 0.9 %  sodium chloride infusion   Intravenous Continuous Rise Patience, MD 150 mL/hr at 08/20/17 0054    . acetaminophen (TYLENOL) tablet 650 mg  650 mg Oral Q4H PRN Rise Patience, MD       Or  . acetaminophen (TYLENOL) solution 650 mg  650 mg Per Tube Q4H PRN Rise Patience, MD       Or  . acetaminophen (TYLENOL) suppository 650 mg  650 mg Rectal Q4H PRN Rise Patience, MD      . acyclovir (ZOVIRAX) 600 mg in dextrose 5 % 100 mL IVPB  600 mg Intravenous Q12H Patrecia Pour, MD      . ampicillin (OMNIPEN) 2 g in sodium chloride 0.9 % 100 mL IVPB  2 g Intravenous Q8H Vance Gather B, MD      . cefTRIAXone (ROCEPHIN) 2 g in sodium chloride 0.9 % 100 mL IVPB  2 g Intravenous Q12H Vance Gather B, MD 200 mL/hr at 08/20/17 1306 2 g at 08/20/17 1306  . hydrALAZINE (APRESOLINE) injection 10 mg  10 mg Intravenous Q4H PRN Rise Patience, MD      . pantoprazole (PROTONIX) injection 40 mg  40 mg Intravenous Q12H Rise Patience, MD   40 mg at 08/20/17 1118  . thiamine (B-1) injection 100 mg  100 mg Intravenous Daily Rise Patience, MD      . vancomycin (VANCOCIN) IVPB 750 mg/150 ml premix  750 mg Intravenous Q24H Rise Patience, MD       Current Outpatient  Medications  Medication Sig Dispense Refill  . ALPRAZolam (XANAX) 1 MG tablet Take 1 mg by mouth 2 (two) times daily. For anxiety.     Marland Kitchen aspirin EC 325 MG EC tablet Take 1 tablet (325 mg total) by mouth daily. 30 tablet 0  . bisoprolol-hydrochlorothiazide (ZIAC) 5-6.25 MG tablet take 1 tablet by mouth once daily 90 tablet 1  . buPROPion (WELLBUTRIN XL) 150 MG 24 hr tablet Take 450 mg by mouth daily.    . Cholecalciferol (VITAMIN D3) 5000 UNITS CAPS Take 5,000 Units by mouth daily. Only take 5 days  week    . gabapentin (NEURONTIN) 300 MG capsule  Take 300 mg by mouth at bedtime.   0  . hydrochlorothiazide (MICROZIDE) 12.5 MG capsule take 1 capsule by mouth EVERY OTHER DAY daily for blood pressure and FLUID (Patient taking differently: Take 12.5 mg by mouth daily. take 1 capsule by mouth EVERY OTHER DAY daily for blood pressure and FLUID) 90 capsule 1  . lamoTRIgine (LAMICTAL) 200 MG tablet Take 400 mg by mouth daily.   0  . rosuvastatin (CRESTOR) 40 MG tablet take 1/2 to 1 tablet by mouth once daily (Patient taking differently: take 20 MG by mouth once daily) 90 tablet 1  . sertraline (ZOLOFT) 100 MG tablet Take 200 mg by mouth daily.   0  . Thiamine HCl (VITAMIN B-1) 250 MG tablet Take 250 mg by mouth 2 (two) times daily.      Allergies as of 08/19/2017 - Review Complete 08/19/2017  Allergen Reaction Noted  . Lipitor [atorvastatin]  03/31/2013  . Prednisone Other (See Comments) 05/12/2014    Family History  Problem Relation Age of Onset  . Heart disease Mother   . Diabetes Mother   . Leukemia Father   . Heart disease Brother   . Cancer Brother   . Parkinson's disease Brother     Social History   Socioeconomic History  . Marital status: Divorced    Spouse name: Not on file  . Number of children: Not on file  . Years of education: Not on file  . Highest education level: Not on file  Occupational History  . Not on file  Social Needs  . Financial resource strain: Not on file  .  Food insecurity:    Worry: Not on file    Inability: Not on file  . Transportation needs:    Medical: Not on file    Non-medical: Not on file  Tobacco Use  . Smoking status: Former Smoker    Types: Cigarettes  . Smokeless tobacco: Never Used  . Tobacco comment: quit yrs ago, 3 daily  Substance and Sexual Activity  . Alcohol use: No  . Drug use: No  . Sexual activity: Never  Lifestyle  . Physical activity:    Days per week: Not on file    Minutes per session: Not on file  . Stress: Not on file  Relationships  . Social connections:    Talks on phone: Not on file    Gets together: Not on file    Attends religious service: Not on file    Active member of club or organization: Not on file    Attends meetings of clubs or organizations: Not on file    Relationship status: Not on file  . Intimate partner violence:    Fear of current or ex partner: Not on file    Emotionally abused: Not on file    Physically abused: Not on file    Forced sexual activity: Not on file  Other Topics Concern  . Not on file  Social History Narrative   07/05/17 Pt's niece, phone number [phone]. Pt lives alone at home, and doesn't use a cane or walker, is still pretty active. Never smoker.    1 daughter- deceased   Education- 68   Retired from [employer]   Caffeine- coffee,1 daily, soda 1 daily    Review of Systems: Pt unable to provide  Physical Exam: Vital signs in last 24 hours: Temp:  [99.9 F (37.7 C)] 99.9 F (37.7 C) (04/08 1813) Pulse Rate:  [66-94] 69 (04/09 1300) Resp:  [  15-28] 26 (04/09 1300) BP: (88-120)/(41-97) 106/54 (04/09 1300) SpO2:  [86 %-100 %] 99 % (04/09 1300) Weight:  [130 lb (59 kg)] 130 lb (59 kg) (04/08 1750)   General: Obtunded well-developed, elderly white female with multiple ecchymoses on her head face upper and lower extremities.  There is extensive ecchymoses of the left posterior thigh and hip. Head:  Normocephalic and atraumatic. Eyes:  Sclera  clear, no icterus.   Conjunctiva pink. Ears:  Normal auditory acuity. Nose:  No deformity, discharge,  or lesions. Mouth:  No deformity or lesions.  Oropharynx is very dry, tongue dry and somewhat cracked there may be some old dried blood around her lips and in her mouth Neck:  Supple; no masses or thyromegaly. Lungs:  Clear throughout to auscultation.   No wheezes, crackles, or rhonchi. Heart:  Regular rate and rhythm; no murmurs, clicks, rubs,  or gallops. Abdomen:  Soft,nontender, BS active,nonpalp mass or hsm.   Rectal:  Deferred -documented heme-negative on admission Msk:  Symmetrical without gross deformities. . Pulses:  Normal pulses noted. Extremities:  Without clubbing or edema.-Extensive ecchymoses as above Neurologic: Obtunded currently nonconversant Skin:  Intact .Marland Kitchen   Intake/Output from previous day: 04/08 0701 - 04/09 0700 In: 2100 [I.V.:1000; IV Piggyback:1100] Out: -  Intake/Output this shift: No intake/output data recorded.  Lab Results: Recent Labs    08/19/17 2128 08/20/17 0357 08/20/17 1036  WBC 13.5* 10.3 9.6  HGB 8.9* 8.2* 8.2*  HCT 27.6* 26.5* 25.7*  PLT 177 158 163   BMET Recent Labs    08/19/17 1807 08/19/17 1810 08/20/17 1200  NA 142 142 143  K 2.8* 2.8* 2.4*  CL 102 103 113*  CO2 23  --  20*  GLUCOSE 121* 122* 94  BUN 51* 42* 35*  CREATININE 1.85* 1.80* 1.21*  CALCIUM 9.3  --  8.1*   LFT Recent Labs    08/19/17 1807  PROT 6.0*  ALBUMIN 3.6  AST 121*  ALT 43  ALKPHOS 61  BILITOT 1.0   PT/INR Recent Labs    08/19/17 1807  LABPROT 14.2  INR 1.10   Hepatitis Panel No results for input(s): HEPBSAG, HCVAB, HEPAIGM, HEPBIGM in the last 72 hours.     IMPRESSION:  #49 72 year old white female admitted last evening to the emergency room after being found down at home on the floor with altered mental status and extensive ecchymoses. She is felt to have an acute toxic metabolic encephalopathy-unclear precipitant Urine drug screen  was positive for benzos question component of withdrawal #2 rhabdomyolysis #3 normocytic anemia with drop in hemoglobin since admission with no evidence of overt GI bleeding-heme-negative on admission Dried caked blood in the mouth unclear whether this may be related to prior hematemesis from possible bite to tongue etc. Consider intra-abdominal blood loss  #4 bipolar disorder, history of anxiety and depression #5 history of EtOH abuse #6 prior TIA  PLAN: #1 serial hemoglobins and transfuse as indicated for hemoglobin 7 or less #2 Cover with IV PPI once daily #3 CT of the abdomen and pelvis with no IV contrast to rule out intra-abdominal blood loss # 4 Can consider upper endoscopy once her other acute issues are sorted out ,improved ,and stable .She has no evidence currently for acute GI bleeding   Amy Esterwood  08/20/2017, 3:05 PM   Attending physician's note   I have reviewed the chart and examined the patient.  Patient unable to give any history.  I agree with the Advanced Practitioner's note, impression  and recommendations.   72 year old with toxic metabolic encephalopathy, rhabdomyolysis who had some dried blood in the mouth.  She has history of alcohol abuse.  She did have drop in hemoglobin.  No obvious GI bleeding.  Plan: Noncontrast CT scan to rule out retroperitoneal bleed.  Proceed with a neurology workup.  IV Protonix.  Trend hemoglobin.  Will consider EGD if she has ongoing problems.  Carmell Austria, MD

## 2017-08-20 NOTE — ED Notes (Signed)
Echo complete

## 2017-08-20 NOTE — ED Notes (Signed)
Patient transported to CT 

## 2017-08-20 NOTE — ED Notes (Signed)
Patient has been calm and quiet for one hour. Patient generally remains quiet if you do not disturb her (touch her IV's, talk to her, toucher her, etc). Will continue to monitor.

## 2017-08-20 NOTE — Procedures (Signed)
ELECTROENCEPHALOGRAM REPORT  Date of Study: 08/20/2017  Patient's Name: Angelica Kelley MRN: [id number] Date of Birth: [birth year]  Referring Provider: Gean Birchwood, MD  Clinical History: 72 y.o. female with a history of bipolar disorder, TIA, prediabetes, HTN, HLD who presented to the ED with AMS, found on the ground by her niece with widespread bruising, grossly altered from baseline with dried blood noted from the mouth. On evaluation she has been afebrile, hypoxic and encephalopathic. CT head revealed interval clearing of chronic SDH,  Medications: Rocephin Hydralazine Ampicillin Acyclovir  Technical Summary: A multichannel digital EEG recording measured by the international 10-20 system with electrodes applied with paste and impedances below 5000 ohms performed as portable with EKG monitoring in an awake and asleep patient.  Hyperventilation and photic stimulation were not performed.  The digital EEG was referentially recorded, reformatted, and digitally filtered in a variety of bipolar and referential montages for optimal display.   Description: The patient is awake and asleep during the recording.  During maximal wakefulness, there is low amplitude diffuse 4-5 Hz theta and 2-3 Hz delta slowing with posterior dominant rhythm of 5 Hz.  During drowsiness and sleep, there is an increase in theta slowing of the background.  Vertex waves and symmetric sleep spindles were seen.  There were no epileptiform discharges or electrographic seizures seen.    EKG lead was unremarkable.  Impression: This awake and asleep EEG is abnormal due to diffuse slowing of the waking background.  Clinical Correlation of the above findings indicates diffuse cerebral dysfunction that is non-specific in etiology and can be seen with hypoxic/ischemic injury, toxic/metabolic encephalopathies, neurodegenerative disorders, or medication effect.  The absence of epileptiform discharges does not rule out a clinical  diagnosis of epilepsy.  Clinical correlation is advised.   Metta Clines, DO

## 2017-08-20 NOTE — ED Notes (Signed)
Patient changed and washed; new purwick placed and fresh gown given; mouth swabbed and brushed bc full of dried blood. Patient tolerated well followed commands.

## 2017-08-20 NOTE — ED Notes (Signed)
Admitting physician at bedside

## 2017-08-20 NOTE — ED Notes (Signed)
Patient's brother at bedside.

## 2017-08-20 NOTE — ED Notes (Signed)
Mittens assessed. Adequate room for two fingers under edge of mitten. Hands warm with cap refill less than  3 seconds.

## 2017-08-21 ENCOUNTER — Inpatient Hospital Stay: Payer: Self-pay

## 2017-08-21 LAB — CBC
HCT: 28.8 % — ABNORMAL LOW (ref 36.0–46.0)
HEMOGLOBIN: 9.4 g/dL — AB (ref 12.0–15.0)
MCH: 29.1 pg (ref 26.0–34.0)
MCHC: 32.6 g/dL (ref 30.0–36.0)
MCV: 89.2 fL (ref 78.0–100.0)
PLATELETS: 213 10*3/uL (ref 150–400)
RBC: 3.23 MIL/uL — ABNORMAL LOW (ref 3.87–5.11)
RDW: 14.2 % (ref 11.5–15.5)
WBC: 13.3 10*3/uL — ABNORMAL HIGH (ref 4.0–10.5)

## 2017-08-21 LAB — GLUCOSE, CAPILLARY
Glucose-Capillary: 97 mg/dL (ref 65–99)
Glucose-Capillary: 103 mg/dL — ABNORMAL HIGH (ref 65–99)
Glucose-Capillary: 98 mg/dL (ref 65–99)
Glucose-Capillary: 90 mg/dL (ref 65–99)
Glucose-Capillary: 121 mg/dL — ABNORMAL HIGH (ref 65–99)
Glucose-Capillary: 129 mg/dL — ABNORMAL HIGH (ref 65–99)

## 2017-08-21 LAB — BASIC METABOLIC PANEL
ANION GAP: 16 — AB (ref 5–15)
BUN: 22 mg/dL — AB (ref 6–20)
CALCIUM: 8.5 mg/dL — AB (ref 8.9–10.3)
CO2: 19 mmol/L — ABNORMAL LOW (ref 22–32)
CREATININE: 0.97 mg/dL (ref 0.44–1.00)
Chloride: 111 mmol/L (ref 101–111)
GFR calc Af Amer: >60 mL/min (ref >60)
GFR, EST NON AFRICAN AMERICAN: 56 mL/min — AB (ref >60)
Glucose, Bld: 99 mg/dL (ref 65–99)
Potassium: 2.5 mmol/L — CL (ref 3.5–5.1)
Sodium: 146 mmol/L — ABNORMAL HIGH (ref 135–145)

## 2017-08-21 LAB — CK: CK TOTAL: 3967 U/L — AB (ref 38–234)

## 2017-08-21 LAB — MAGNESIUM: MAGNESIUM: 2.4 mg/dL (ref 1.7–2.4)

## 2017-08-21 MED ORDER — POTASSIUM CHLORIDE 10 MEQ/100ML IV SOLN
10.0000 meq | INTRAVENOUS | Status: DC
  Administered 2017-08-21: 10 meq via INTRAVENOUS
  Filled 2017-08-21 (×2): qty 100

## 2017-08-21 MED ORDER — VANCOMYCIN HCL 500 MG IV SOLR
500.0000 mg | Freq: Two times a day (BID) | INTRAVENOUS | Status: DC
  Administered 2017-08-21 – 2017-08-22 (×3): 500 mg via INTRAVENOUS
  Filled 2017-08-21 (×4): qty 500

## 2017-08-21 MED ORDER — SODIUM CHLORIDE 0.9% FLUSH
10.0000 mL | Freq: Two times a day (BID) | INTRAVENOUS | Status: DC
  Administered 2017-08-21 – 2017-08-27 (×12): 10 mL

## 2017-08-21 MED ORDER — SODIUM CHLORIDE 0.9 % IV SOLN
2.0000 g | Freq: Four times a day (QID) | INTRAVENOUS | Status: DC
  Administered 2017-08-21 – 2017-08-22 (×6): 2 g via INTRAVENOUS
  Filled 2017-08-21 (×8): qty 2000

## 2017-08-21 MED ORDER — SODIUM CHLORIDE 0.9% FLUSH: 10.0000 mL | INTRAVENOUS | Status: DC | PRN

## 2017-08-21 MED ORDER — POTASSIUM CHLORIDE CRYS ER 20 MEQ PO TBCR
40.0000 meq | EXTENDED_RELEASE_TABLET | Freq: Two times a day (BID) | ORAL | Status: DC
  Administered 2017-08-21 (×2): 40 meq via ORAL
  Filled 2017-08-21 (×2): qty 2

## 2017-08-21 NOTE — Evaluation (Signed)
Occupational Therapy Evaluation Patient Details Name: Angelica Kelley MRN: [id number] DOB: 05/24/1945 Today's Date: 08/21/2017    History of Present Illness Pt is a 72 y/o female admitted after being found down in home and also with acute encephalopathy. CT of head showed interval clearing of L subdural hematoma and CT of C spine negative for acute abnormality. PMH includes bipolar disorder, TIA, pre-diabetes, and HTN.    Clinical Impression   Pt admitted with above. She demonstrates the below listed deficits and will benefit from continued OT to maximize safety and independence with BADLs.  Pt presents to OT with impaired cognition, decreased balance, decreased activity tolerance.  She requires min A for ADLs and min A for functional mobility.  She fatigues quickly and has poor ability to self monitor.  She was living alone with niece checking on her.  She has had multiple falls, and is at high risk for continued falls and injury.  Feel she needs a higher level of care.  Recommend SNF level rehab with transition to ALF.       Follow Up Recommendations  SNF    Equipment Recommendations  None recommended by OT    Recommendations for Other Services       Precautions / Restrictions Precautions Precautions: Fall      Mobility Bed Mobility Overal bed mobility: Needs Assistance Bed Mobility: Supine to Sit     Supine to sit: Min assist     General bed mobility comments: assist to lift trunk   Transfers Overall transfer level: Needs assistance Equipment used: 1 person hand held assist;Rolling walker (2 wheeled) Transfers: Sit to/from [facility] Sit to Stand: Min assist Stand pivot transfers: Min assist       General transfer comment: assist to steady     Balance Overall balance assessment: Needs assistance Sitting-balance support: Feet supported Sitting balance-Leahy Scale: Fair     Standing balance support: Single extremity supported;Bilateral upper  extremity supported;During functional activity Standing balance-Leahy Scale: Poor Standing balance comment: requires UE support and min A.  Posterior LOB                            ADL either performed or assessed with clinical judgement   ADL Overall ADL's : Needs assistance/impaired Eating/Feeding: Set up;Sitting   Grooming: Wash/dry hands;Wash/dry face;Brushing hair;Minimal assistance;Sitting   Upper Body Bathing: Moderate assistance;Sitting Upper Body Bathing Details (indicate cue type and reason): assist for thoroughness  Lower Body Bathing: Moderate assistance;Sit to/from stand Lower Body Bathing Details (indicate cue type and reason): assist for thoroughness Upper Body Dressing : Sitting;Moderate assistance   Lower Body Dressing: Maximal assistance;Sit to/from stand   Toilet Transfer: Minimal assistance;Stand-pivot;BSC;RW Toilet Transfer Details (indicate cue type and reason): Pt requires assist to steady.  Lost balance posteriorly  Toileting- Clothing Manipulation and Hygiene: Moderate assistance;Sit to/from stand Toileting - Clothing Manipulation Details (indicate cue type and reason): assist for balance and for thoroughness      Functional mobility during ADLs: Minimal assistance;Rolling walker       Vision Baseline Vision/History: No visual deficits Additional Comments: to be assessed      Perception Perception Perception Tested?: Yes   Praxis Praxis Praxis tested?: Within functional limits    Pertinent Vitals/Pain Pain Assessment: No/denies pain     Hand Dominance Right   Extremity/Trunk Assessment Upper Extremity Assessment Upper Extremity Assessment: RUE deficits/detail;LUE deficits/detail RUE Deficits / Details: generalized weakness.  Mildly tremulous  RUE Coordination:  decreased fine motor;decreased gross motor LUE Deficits / Details: generalized weakness.  pt tremulous  LUE Coordination: decreased fine motor;decreased gross motor    Lower Extremity Assessment Lower Extremity Assessment: Defer to PT evaluation   Cervical / Trunk Assessment Cervical / Trunk Assessment: Other exceptions(head/neck tremulous )   Communication Communication Communication: No difficulties   Cognition Arousal/Alertness: Awake/alert Behavior During Therapy: WFL for tasks assessed/performed Overall Cognitive Status: Impaired/Different from baseline Area of Impairment: Orientation;Attention;Memory;Following commands;Safety/judgement;Awareness;Problem solving                 Orientation Level: Disoriented to;Situation;Time Current Attention Level: Sustained Memory: Decreased short-term memory Following Commands: Follows one step commands consistently;Follows multi-step commands inconsistently Safety/Judgement: Decreased awareness of deficits;Decreased awareness of safety   Problem Solving: Difficulty sequencing;Requires verbal cues;Requires tactile cues General Comments: Pt fixated on wanting water despite being reassured that therapist was going to get it for her.  She is impulsive, requires mod cues for problem solving and sequencing    General Comments  02 sats remained >90% on RA .  DOE 3/4     Exercises     Shoulder Instructions      Home Living Family/patient expects to be discharged to:: Skilled nursing facility Living Arrangements: Alone Available Help at Discharge: Family;Available PRN/intermittently Type of Home: Mobile home Home Access: Stairs to enter Entrance Stairs-Number of Steps: 4 Entrance Stairs-Rails: Right Home Layout: One level     Bathroom Shower/Tub: Teacher, early years/pre: Standard     Home Equipment: None   Additional Comments: Information from previous session. Pt with garbled speech and unable to answer questions about home. Per RN, pt from home and was previously independent. Will need to follow up with family to see if info correct.      Prior Functioning/Environment Level  of Independence: Independent        Comments: Per chart review, pt lived alone and was indpendent with ADLs.  Niece was assisting with medication management , but pt was having difficulties using medicine boxes.  Chart indicates pt was having frequent falls, and family recently took pt's car keys to prevent driving        OT Problem List: Decreased strength;Decreased activity tolerance;Impaired balance (sitting and/or standing);Decreased safety awareness;Decreased cognition;Decreased knowledge of use of DME or AE      OT Treatment/Interventions: Self-care/ADL training;Therapeutic exercise;DME and/or AE instruction;Therapeutic activities;Cognitive remediation/compensation;Patient/family education;Balance training    OT Goals(Current goals can be found in the care plan section) Acute Rehab OT Goals Patient Stated Goal: to go home  OT Goal Formulation: With patient Time For Goal Achievement: 09/04/17 Potential to Achieve Goals: Good ADL Goals Pt Will Perform Grooming: with min guard assist;standing Pt Will Perform Lower Body Bathing: with min guard assist;sit to/from stand Pt Will Perform Lower Body Dressing: with min guard assist;sit to/from stand Pt Will Transfer to Toilet: with min guard assist;ambulating;regular height toilet;bedside commode;grab bars Pt Will Perform Toileting - Clothing Manipulation and hygiene: with min guard assist;sit to/from stand  OT Frequency: Min 2X/week   Barriers to D/C: Decreased caregiver support          Co-evaluation              AM-PAC PT "6 Clicks" Daily Activity     Outcome Measure Help from another person eating meals?: None Help from another person taking care of personal grooming?: A Little Help from another person toileting, which includes using toliet, bedpan, or urinal?: A Little Help from another person bathing (including washing,  rinsing, drying)?: A Little Help from another person to put on and taking off regular upper body  clothing?: A Little Help from another person to put on and taking off regular lower body clothing?: A Little 6 Click Score: 19   End of Session Equipment Utilized During Treatment: Rolling walker;Oxygen Nurse Communication: Mobility status  Activity Tolerance: Patient tolerated treatment well Patient left: in chair;with call bell/phone within reach;with chair alarm set  OT Visit Diagnosis: Unsteadiness on feet (R26.81);Cognitive communication deficit (R41.841)                Time: 1331-1406 OT Time Calculation (min): 35 min Charges:  OT General Charges $OT Visit: 1 Visit OT Evaluation $OT Eval Moderate Complexity: 1 Mod OT Treatments $Self Care/Home Management : 8-22 mins G-Codes:     [facility], OTR/L [phone]   [name] M 08/21/2017, 5:48 PM

## 2017-08-21 NOTE — Social Work (Addendum)
CSW completed SNF assessment with pt niece. Pt niece also expressed pt brother Corena Herter should be contacted for support/decision making also at [phone].   CSW awaits updated PT/OT evaluations needed for pt insurance and to complete FL2. Hard copy of 30 day note for PASSR number has been left on chart for MD to sign. MD paged, aware of note and will complete tomorrow.   CSW continuing to follow.  Alexander Mt, [place] Work [phone]

## 2017-08-21 NOTE — Social Work (Signed)
CSW acknowledging SNF recommendation, pt will need OT notes for insurance authorization as well in order to d/c to SNF.   Alexander Mt, [place] Work [phone]

## 2017-08-21 NOTE — Progress Notes (Signed)
Peripherally Inserted Central Catheter/Midline Placement  The IV Nurse has discussed with the patient and/or persons authorized to consent for the patient, the purpose of this procedure and the potential benefits and risks involved with this procedure.  The benefits include less needle sticks, lab draws from the catheter, and the patient may be discharged home with the catheter. Risks include, but not limited to, infection, bleeding, blood clot (thrombus formation), and puncture of an artery; nerve damage and irregular heartbeat and possibility to perform a PICC exchange if needed/ordered by physician.  Alternatives to this procedure were also discussed.  Bard Power PICC patient education guide, fact sheet on infection prevention and patient information card has been provided to patient /or left at bedside.    PICC/Midline Placement Documentation  PICC Double Lumen 91/79/15 PICC Right Basilic 38 cm 2 cm (Active)  Indication for Insertion or Continuance of Line Poor Vasculature-patient has had multiple peripheral attempts or PIVs lasting less than 24 hours 08/21/2017 12:23 PM  Exposed Catheter (cm) 2 cm 08/21/2017 12:23 PM  Site Assessment Clean;Dry;Intact 08/21/2017 12:23 PM  Lumen #1 Status Flushed;Saline locked;Blood return noted 08/21/2017 12:23 PM  Lumen #2 Status Flushed;Saline locked;Blood return noted 08/21/2017 12:23 PM  Dressing Type Transparent 08/21/2017 12:23 PM  Dressing Status Clean;Dry;Intact;Antimicrobial disc in place 08/21/2017 12:23 PM  Dressing Intervention New dressing 08/21/2017 12:23 PM  Dressing Change Due 08/28/17 08/21/2017 12:23 PM       [facility] Payment 08/21/2017, 12:24 PM

## 2017-08-21 NOTE — Progress Notes (Signed)
Pharmacy Antibiotic Note  Angelica Kelley is a 72 y.o. female admitted on 08/19/2017 with meningitis.  Pharmacy has been consulted for vancomycin, ceftriaxone, and acyclovir dosing.  Renal function has dramatically improved- SCR down to 0.96 this morning with CrCl ~40-55mL/min.  Plan: Change Vancomycin to 500mg  IV q12h. Goal trough 15-20 mcg/mL. Ceftriaxone 2g IV q12h Acyclovir 600mg  IV q12h Change Ampicillin to 2g q6h F/U cultures, LOT, renal function, and vancomycin trough prn.  Height: 5\' 4"  (162.6 cm) Weight: 130 lb (59 kg) IBW/kg (Calculated) : 54.7  Temp (24hrs), Avg:98.8 F (37.1 C), Min:97.5 F (36.4 C), Max:99.8 F (37.7 C)  Recent Labs  Lab 08/19/17 1807 08/19/17 1810  08/19/17 2143 08/20/17 0357 08/20/17 1036 08/20/17 1200 08/20/17 1806 08/20/17 2217 08/21/17 0344  WBC 14.3*  --    < >  --  10.3 9.6  --  9.0 10.9* 13.3*  CREATININE 1.85* 1.80*  --   --   --   --  1.21*  --  1.04* 0.97  LATICACIDVEN  --  1.37  --  1.74  --   --   --   --   --   --    < > = values in this interval not displayed.    Estimated Creatinine Clearance: 43.9 mL/min (by C-G formula based on SCr of 0.97 mg/dL).    Allergies  Allergen Reactions  . Lipitor [Atorvastatin]     Fatigue  . Prednisone Other (See Comments)    Change in mental status    Antimicrobials this admission: Zosyn 4/8 >> 4/8 Vancomycin 4/8 >> Ceftriaxone 4/8 >> Acyclovir 4/8 >> Ampicillin 4/8 >>  Dose adjustments this admission: 4/10- ampicillin and vancomycin increased d/t improvement in renal function  Microbiology results: 4/8 BCx: ngtd 4/8 UCx: neg 4/9 MRSA PCR: neg  Thank you for allowing pharmacy to be a part of this patient's care.  Kimmie Berggren D. Vika Buske, PharmD, BCPS Clinical Pharmacist Clinical Phone for 08/21/2017 until 3:30pm: [phone] If after 3:30pm, please call main pharmacy at [phone] 08/21/2017 8:34 AM

## 2017-08-21 NOTE — Progress Notes (Signed)
SLP Cancellation Note  Patient Details Name: Angelica Kelley MRN: [id number] DOB: Jul 30, 1945   Cancelled treatment:       Reason Eval/Treat Not Completed: SLP screened, no needs identified, will sign off. Given negative Ct finding for CVA and primary work up for acute encephalopathy, will defer cognitive linguistic eval ordered as part of stroke order set. Cognitive interventions not appropriate for dx at this time. Please reorder if needs arise for SLP.    Eutimio Gharibian, Katherene Ponto 08/21/2017, 10:38 AM

## 2017-08-21 NOTE — NC FL2 (Addendum)
[place] LEVEL OF CARE SCREENING TOOL     IDENTIFICATION  Patient Name: Angelica Kelley Birthdate: 13-Nov-1945 Sex: female Admission Date (Current Location): 08/19/2017  [facility] and [place] Number:  Herbalist and Address:  [facility], [place] [address], [place], [place] [address]      Provider Number: [id number]  Attending Physician Name and Address:  Edwin Dada, *  Relative Name and Phone Number:       Current Level of Care: Hospital Recommended Level of Care: [place] Prior Approval Number:    Date Approved/Denied:   PASRR Number:   [id number]  Discharge Plan: SNF    Current Diagnoses: Patient Active Problem List   Diagnosis Date Noted  . Acute encephalopathy 08/19/2017  . ARF (acute renal failure) ([place]) 08/19/2017  . Hypokalemia 08/19/2017  . Rhabdomyolysis 08/19/2017  . AKI (acute kidney injury) ([place])   . TIA (transient ischemic attack) 06/18/2017  . Depression with anxiety 05/09/2017  . Fall 05/09/2017  . Slurred speech 05/09/2017  . Acute renal failure superimposed on stage 2 chronic kidney disease ([place]) 05/09/2017  . Chronic kidney disease, stage 3 ([place]) 04/23/2017  . OAB (overactive bladder) 06/09/2014  . Vitamin D deficiency 10/27/2013  . Medication management 10/27/2013  . Hypertension   . Hyperlipidemia, mixed   . GERD (gastroesophageal reflux disease)   . Bipolar depression ([place])   . Alcoholism ([place])   . IBS (irritable bowel syndrome)   . Other abnormal glucose     Orientation RESPIRATION BLADDER Height & Weight     Self  O2(2L nasal canula) Incontinent, External catheter Weight: 130 lb (59 kg) Height:  5\' 4"  (162.6 cm)  BEHAVIORAL SYMPTOMS/MOOD NEUROLOGICAL BOWEL NUTRITION STATUS      Incontinent Diet(see discharge summary)  AMBULATORY STATUS COMMUNICATION OF NEEDS Skin   Extensive Assist Verbally Skin abrasions, Other (Comment), Bruising(skin tear left arm)                     Personal Care Assistance Level of Assistance  Bathing, Feeding, Dressing Bathing Assistance: Maximum assistance Feeding assistance: Independent Dressing Assistance: Maximum assistance     Functional Limitations Info  Sight, Hearing, Speech Sight Info: Adequate Hearing Info: Adequate Speech Info: Adequate    SPECIAL CARE FACTORS FREQUENCY  PT (By licensed PT), OT (By licensed OT)     PT Frequency: 5x week OT Frequency: 5x week            Contractures Contractures Info: Not present    Additional Factors Info  Code Status, Allergies Code Status Info: Full Code Allergies Info: LIPITOR ATORVASTATIN, PREDNISONE            Current Medications (08/21/2017):  This is the current hospital active medication list Current Facility-Administered Medications  Medication Dose Route Frequency Provider Last Rate Last Dose  . acetaminophen (TYLENOL) tablet 650 mg  650 mg Oral Q4H PRN Rise Patience, MD       Or  . acetaminophen (TYLENOL) solution 650 mg  650 mg Per Tube Q4H PRN Rise Patience, MD       Or  . acetaminophen (TYLENOL) suppository 650 mg  650 mg Rectal Q4H PRN Rise Patience, MD      . acyclovir (ZOVIRAX) 600 mg in dextrose 5 % 100 mL IVPB  600 mg Intravenous Q12H Patrecia Pour, MD   Stopped at 08/21/17 1551  . ampicillin (OMNIPEN) 2 g in sodium chloride 0.9 %  100 mL IVPB  2 g Intravenous Q6H Bajbus, Lauren D, RPH 300 mL/hr at 08/21/17 1729 2 g at 08/21/17 1729  . cefTRIAXone (ROCEPHIN) 2 g in sodium chloride 0.9 % 100 mL IVPB  2 g Intravenous Q12H Patrecia Pour, MD   Stopped at 08/21/17 1416  . haloperidol lactate (HALDOL) injection 2 mg  2 mg Intravenous Q6H PRN Opyd, Ilene Qua, MD   2 mg at 08/20/17 2305  . hydrALAZINE (APRESOLINE) injection 10 mg  10 mg Intravenous Q4H PRN Rise Patience, MD      . pantoprazole (PROTONIX) injection 40 mg  40 mg Intravenous Q12H Rise Patience, MD   40 mg at 08/21/17 1312  . potassium chloride  SA (K-DUR,KLOR-CON) CR tablet 40 mEq  40 mEq Oral BID Edwin Dada, MD   40 mEq at 08/21/17 1414  . sodium chloride flush (NS) 0.9 % injection 10-40 mL  10-40 mL Intracatheter Q12H Danford, Suann Larry, MD   10 mL at 08/21/17 1336  . sodium chloride flush (NS) 0.9 % injection 10-40 mL  10-40 mL Intracatheter PRN Danford, Suann Larry, MD      . thiamine (B-1) injection 100 mg  100 mg Intravenous Daily Rise Patience, MD   100 mg at 08/20/17 2235  . vancomycin (VANCOCIN) 500 mg in sodium chloride 0.9 % 100 mL IVPB  500 mg Intravenous Q12H Bajbus, Almeta Monas, RPH   Stopped at 08/21/17 1551     Discharge Medications: Please see discharge summary for a list of discharge medications.  Relevant Imaging Results:  Relevant Lab Results:   Additional Information SS# [place], [place]

## 2017-08-21 NOTE — Clinical Social Work Note (Signed)
Clinical Social Work Assessment  Patient Details  Name: Angelica Kelley MRN: [id number] Date of Birth: 10-05-1945  Date of referral:  08/21/17               Reason for consult:  Facility Placement, Discharge Planning                Permission sought to share information with:  Facility Sport and exercise psychologist, Family Supports Permission granted to share information::  No(pt disoriented and in restraints)  Name::     Angelica Kelley & Angelica Kelley   Agency::  SNFs  Relationship::  neice and brother  Contact Information:  [phone]) & [phone] (brother)   Housing/Transportation Living arrangements for the past 2 months:  Single Family Home Source of Information:  Other (Comment Required)(Neice) Patient Interpreter Needed:  None Criminal Activity/Legal Involvement Pertinent to Current Situation/Hospitalization:  No - Comment as needed Significant Relationships:  Siblings, Other Family Members Lives with:  Self Do you feel safe going back to the place where you live?  No Need for family participation in patient care:  Yes (Comment)  Care giving concerns: CSW spoke with pt niece Angelica Kelley, pt lives alone and is requiring more assistance with daily routine. Pt niece is worried about pt returning back home without getting stronger. Pt niece would like ALF in future but is aware only SNF can be arranged through hospital.    Social Worker assessment / plan:  CSW spoke with pt niece Angelica Kelley, as pt has fluctuating orientation and is usually only oriented to self. Pt niece states that pt lives alone, has a brother, and multiple nieces and nephews including herself. Pt has been independent, and used to work for [employer], however is requiring more assistance and has had multiple falls recently. Pt niece Angelica Kelley lives closes which means that she provides check ins as often as she can, but pt brother is the one that helps pt with decision making when she is unable. Pt niece states that she is  amenable to SNF placement for the pt and would like the list of ALF facilities for her to explore while pt is in hospital/SNF.   CSW to send FL2 to facilities in the [place] area and will continue to follow to support pt when medically appropriate for discharge.   Employment status:  Retired Nurse, adult PT Recommendations:  Not assessed at this time Information / Referral to community resources:  [place]  Patient/Family's Response to care:  Pt niece very Patent attorney for CSW call and support with finding SNF/ALF resources.   Patient/Family's Understanding of and Emotional Response to Diagnosis, Current Treatment, and Prognosis:  Pt niece understands diagnosis, current treatment, and prognosis. Pt niece expresses understanding of therapy needs and was positive about her aunt and her needs. Pt niece did express some frustration at pt not being wiling in the pas to complete POA/HCPOA/financial documents that would allow others to support her in this kind of event, however she is willing to support pt however she can.    Emotional Assessment Appearance:  Appears older than stated age Attitude/Demeanor/Rapport:  Unable to Assess Affect (typically observed):  Unable to Assess Orientation:  Fluctuating Orientation (Suspected and/or reported Sundowners), Oriented to Self Alcohol / Substance use:  Tobacco Use(former smoker) Psych involvement (Current and /or in the community):  No (Comment)  Discharge Needs  Concerns to be addressed:  Care Coordination, Discharge Planning Concerns Readmission within the last 30 days:  No Current discharge risk:  Lives alone, Cognitively Impaired, Physical Impairment, Dependent with Mobility(forgetful) Barriers to Discharge:  Continued Medical Work up, [place], [place] 08/21/2017, 5:36 PM

## 2017-08-21 NOTE — Progress Notes (Signed)
PROGRESS NOTE    Angelica Kelley  HQI:[id number] DOB: 01-15-1946 DOA: 08/19/2017 PCP: Unk Pinto, MD      Brief Narrative:  Angelica Kelley is a 72 y.o. female with a history of bipolar disorder, TIA, prediabetes, HTN, HLD who presented to the ED with AMS, found on the ground by her niece with widespread bruising, grossly altered from baseline with dried blood noted from the mouth. On evaluation she has been afebrile, hypoxic and encephalopathic. CT head revealed interval clearing of chronic SDH, no acute findings on atrophic background and C-spine showed no fracture. CXR unremarkable, urinalysis suggestive of UTI. Hgb down from 12.4 in March to 9.7 on arrival and has continued to trend downward to 8.2 with negative FOBT, INR 1.1, no features of hemolysis. Creatinine up from 1 to 1.8 with CK elevated to 6,984. Due to ongoing agitation, ativan was given and soft mittens placed though LP is unobtainable. MRI and EEG are ordered and empiric coverage for meningitis/encephalitis started per ID phone recommendations. GI is consulted.      Assessment & Plan:  Acute encephalopathy Differential includes benzo overdose, meningitis.  Exam militates militates against infection.  Paradoxically worsened in ER with benzos per report, suggesting this is not withdrawal. No focal neuro deficits to suggest stroke.  This morning, she is quite better. -Remove restraints  -Hold Benzos -Obtain LP -Continue vancomycin, Ceftriaxone, ampicillin, and acyclovir for now -Goal is to obtain LP, if no pleocytosis, I doubt infection or sepsis  Hypokalemia -Replete orally  Anemia -Consult GI -Continue PPI for now  Rhabdomyolysis Nontraumatic, found down.  Likely from #1 above. -IV fluids  Bipolar disorder      DVT prophylaxis: SCDs Code Status: FULL Family Communication: None present MDM and disposition Plan: The below labs and imaging reports were reviewed.  The patient's status is clinically improving.       Consultants:   GI  Procedures:   None  Antimicrobials:   Vanc  Ceftriaxone  Ampicillin  Acyclovir    Subjective: Feels better.  Hungry.  Oriented to self.  Doesn't know how she got here.   No recent headache, neck pain, fever.  No recent rash.  No recent cough, sputum production.  No recent dysuria.  Unsure if she took too much Xanax.    Objective: Vitals:   08/20/17 2300 08/21/17 0259 08/21/17 0300 08/21/17 1128  BP: 110/67 112/70  135/69  Pulse: 83 83  81  Resp:  (!) 36  (!) 30  Temp: 99 F (37.2 C) 99.8 F (37.7 C) 99.6 F (37.6 C) 97.7 F (36.5 C)  TempSrc: Axillary Axillary Axillary Oral  SpO2: 98%   98%  Weight:      Height:        Intake/Output Summary (Last 24 hours) at 08/21/2017 1307 Last data filed at 08/21/2017 0102 Gross per 24 hour  Intake 862 ml  Output 350 ml  Net 512 ml   Filed Weights   08/19/17 1750  Weight: 59 kg (130 lb)    Examination: General appearance:  adult female, alert and in no acute distress.  Now out of restraints HEENT: Anicteric, conjunctiva pink, lids and lashes normal. No nasal deformity, discharge, epistaxis.  Lips dry, dentition normal, OP dry, no oral lesions.  Neck supple, no meningisumus.   Skin: Warm and dry.  No jaundice.  No suspicious rashes or lesions. Cardiac: RRR, nl S1-S2, no murmurs appreciated.  Capillary refill is brisk.  Respiratory: Normal respiratory rate and rhythm.  CTAB without  rales or wheezes. Abdomen: Abdomen soft.  No TTP. No ascites, distension, hepatosplenomegaly.   MSK: No deformities or effusions. Neuro: Awake and alert.  EOMI, moves all extremities. Speech fluent.    Psych: Sensorium intact and responding to questions, nattention normal. Affect normal.  Judgment and insight appear normal.    Data Reviewed: I have personally reviewed following labs and imaging studies:  CBC: Recent Labs  Lab 08/19/17 1807  08/20/17 0357 08/20/17 1036 08/20/17 1806 08/20/17 2217 08/21/17 0344   WBC 14.3*   < > 10.3 9.6 9.0 10.9* 13.3*  NEUTROABS 12.7*  --   --   --   --   --   --   HGB 9.7*   < > 8.2* 8.2* 8.7* 9.0* 9.4*  HCT 30.6*   < > 26.5* 25.7* 26.8* 28.6* 28.8*  MCV 87.9   < > 88.9 88.9 89.9 89.4 89.2  PLT 192   < > 158 163 165 177 213   < > = values in this interval not displayed.   Basic Metabolic Panel: Recent Labs  Lab 08/19/17 1807 08/19/17 1810 08/19/17 1837 08/20/17 1200 08/20/17 2217 08/21/17 0344  NA 142 142  --  143 146* 146*  K 2.8* 2.8*  --  2.4* 2.5* 2.5*  CL 102 103  --  113* 113* 111  CO2 23  --   --  20* 20* 19*  GLUCOSE 121* 122*  --  94 93 99  BUN 51* 42*  --  35* 27* 22*  CREATININE 1.85* 1.80*  --  1.21* 1.04* 0.97  CALCIUM 9.3  --   --  8.1* 8.6* 8.5*  MG  --   --  2.7*  --   --  2.4   GFR: Estimated Creatinine Clearance: 43.9 mL/min (by C-G formula based on SCr of 0.97 mg/dL). Liver Function Tests: Recent Labs  Lab 08/19/17 1807  AST 121*  ALT 43  ALKPHOS 61  BILITOT 1.0  PROT 6.0*  ALBUMIN 3.6   No results for input(s): LIPASE, AMYLASE in the last 168 hours. Recent Labs  Lab 08/19/17 2128  AMMONIA 16   Coagulation Profile: Recent Labs  Lab 08/19/17 1807  INR 1.10   Cardiac Enzymes: Recent Labs  Lab 08/19/17 1807 08/19/17 2128 08/20/17 0357 08/20/17 1200 08/21/17 0344  CKTOTAL 5,453*  --  6,984*  --  3,967*  TROPONINI  --  0.05*  --  0.05*  --    BNP (last 3 results) No results for input(s): PROBNP in the last 8760 hours. HbA1C: No results for input(s): HGBA1C in the last 72 hours. CBG: Recent Labs  Lab 08/20/17 1649 08/20/17 2021 08/20/17 2352 08/21/17 0255 08/21/17 0750  GLUCAP 86 75 97 103* 98   Lipid Profile: No results for input(s): CHOL, HDL, LDLCALC, TRIG, CHOLHDL, LDLDIRECT in the last 72 hours. Thyroid Function Tests: Recent Labs    08/19/17 2128  TSH 1.617   Anemia Panel: Recent Labs    08/19/17 2128  VITAMINB12 1,042*  FOLATE 11.0  FERRITIN 119  TIBC 291  IRON 31   RETICCTPCT 1.9   Urine analysis:    Component Value Date/Time   COLORURINE AMBER (A) 08/19/2017 1835   APPEARANCEUR CLOUDY (A) 08/19/2017 1835   LABSPEC 1.023 08/19/2017 1835   PHURINE 5.0 08/19/2017 1835   GLUCOSEU NEGATIVE 08/19/2017 1835   HGBUR LARGE (A) 08/19/2017 1835   BILIRUBINUR NEGATIVE 08/19/2017 [place] 08/19/2017 1835   PROTEINUR 30 (A) 08/19/2017 1835  UROBILINOGEN 1 06/09/2014 1531   NITRITE NEGATIVE 08/19/2017 [place] 08/19/2017 1835   Sepsis Labs: @LABRCNTIP (procalcitonin:4,lacticacidven:4)  ) Recent Results (from the past 240 hour(s))  Blood Culture (routine x 2)     Status: None (Preliminary result)   Collection Time: 08/19/17  6:00 PM  Result Value Ref Range Status   Specimen Description BLOOD RIGHT ANTECUBITAL  Final   Special Requests   Final    BOTTLES DRAWN AEROBIC AND ANAEROBIC Blood Culture adequate volume   Culture   Final    NO GROWTH < 24 HOURS Performed at [facility], [place] [address], [place], [place] [address]    Report Status PENDING  Incomplete  Blood Culture (routine x 2)     Status: None (Preliminary result)   Collection Time: 08/19/17  6:26 PM  Result Value Ref Range Status   Specimen Description BLOOD LEFT ANTECUBITAL  Final   Special Requests   Final    BOTTLES DRAWN AEROBIC AND ANAEROBIC Blood Culture adequate volume   Culture   Final    NO GROWTH < 24 HOURS Performed at [facility], [place] [address], [place], [place] [address]    Report Status PENDING  Incomplete  Urine culture     Status: None   Collection Time: 08/19/17  6:35 PM  Result Value Ref Range Status   Specimen Description URINE, CATHETERIZED  Final   Special Requests NONE  Final   Culture   Final    NO GROWTH Performed at [facility], [place] [address], [place], [place] [address]    Report Status 08/20/2017 FINAL  Final  MRSA PCR Screening     Status: None   Collection Time: 08/20/17  6:01 PM  Result  Value Ref Range Status   MRSA by PCR NEGATIVE NEGATIVE Final    Comment:        The GeneXpert MRSA Assay (FDA approved for NASAL specimens only), is one component of a comprehensive MRSA colonization surveillance program. It is not intended to diagnose MRSA infection nor to guide or monitor treatment for MRSA infections. Performed at [facility], [place] [address] [place], [place] [address]          Radiology Studies: Ct Abdomen Pelvis Wo Contrast  Result Date: 08/20/2017 CLINICAL DATA:  Anemia.  Recent fall. EXAM: CT ABDOMEN AND PELVIS WITHOUT CONTRAST TECHNIQUE: Multidetector CT imaging of the abdomen and pelvis was performed following the standard protocol without IV contrast. COMPARISON:  Lumbar spine MRI 09/29/2013. FINDINGS: Lower chest: Patchy ground-glass opacities in both lung bases with interlobular septal thickening and small pleural effusions. Decreased attenuation of the blood pool compatible with anemia. Coronary artery atherosclerosis. Hepatobiliary: No focal liver abnormality is seen. There is moderate distention of the gallbladder without gross wall thickening or acute inflammatory changes. There is no biliary dilatation. Pancreas: Unremarkable. Spleen: Unremarkable. Adrenals/Urinary Tract: Mild bilateral adrenal gland thickening. Punctate right and possibly left renal calculi. No hydronephrosis. Unremarkable bladder. Stomach/Bowel: The stomach is within normal limits. A moderately large amount of stool is present in the colon and rectum. There is no evidence of bowel obstruction. Vascular/Lymphatic: Abdominal aortic atherosclerosis without aneurysm. No enlarged lymph nodes. Reproductive: Status post hysterectomy. No adnexal masses. Other: No intraperitoneal free fluid.  No retroperitoneal hematoma. Musculoskeletal: Multiple old left rib fractures. Chronic L3 and L4 compression fractures. Bilateral L4 pars defects with 9 mm anterolisthesis of L4 on L5. 6 mm anterolisthesis  of L3 on L4. IMPRESSION:  1. No acute abnormality identified in the abdomen or pelvis. 2. Ground-glass opacities in both lung bases. Potential etiologies include acute causes such as edema, infection, and pulmonary hemorrhage as well as chronic processes including interstitial lung disease. 3. Small pleural effusions. 4. Nonobstructing nephrolithiasis. 5. Moderately large amount of stool in the colon and rectum. No bowel obstruction. Electronically Signed   By: Logan Bores M.D.   On: 08/20/2017 17:54   Ct Head Wo Contrast  Result Date: 08/19/2017 CLINICAL DATA:  72 year old female found on floor by family. Last seen 3 days ago. Initial encounter. EXAM: CT HEAD WITHOUT CONTRAST CT CERVICAL SPINE WITHOUT CONTRAST TECHNIQUE: Multidetector CT imaging of the head and cervical spine was performed following the standard protocol without intravenous contrast. Multiplanar CT image reconstructions of the cervical spine were also generated. COMPARISON:  07/02/2017 MR. 07/01/2016 CT head and cervical spine FINDINGS: CT HEAD FINDINGS Brain: Exam is motion degraded. Resolution of previously noted left-sided chronic subdural hematoma. No new intracranial hemorrhage noted. Chronic microvascular changes without CT evidence of large acute infarct. Global atrophy. No intracranial mass lesion noted on this unenhanced exam. Vascular: Vascular calcifications Skull: No skull fracture Sinuses/Orbits: No acute orbital abnormality. Visualized paranasal sinuses are clear. Mastoid air cells and middle ear cavities are clear. Temporomandibular joint degenerative changes. Other: Left parietal scalp hematoma. CT CERVICAL SPINE FINDINGS Alignment: Curvature cervical spine convex left. Skull base and vertebrae: No cervical spine fracture noted. Soft tissues and spinal canal: No abnormal prevertebral soft tissue swelling. Disc levels: Multilevel cervical spondylotic changes most prominent C5-6 level. Upper chest: Scarring lung apices. Other:  Bilateral carotid bifurcation calcifications. IMPRESSION: Exams are motion degraded. Interval clearing of previously noted left-sided chronic subdural hematoma. No new intracranial hemorrhage noted. Chronic microvascular changes without CT evidence of large acute infarct. Atrophy. No cervical spine fracture detected. Curvature cervical spine convex left. No abnormal prevertebral soft tissue swelling. Cervical spondylotic changes similar to prior exam. Electronically Signed   By: Genia Del M.D.   On: 08/19/2017 19:47   Ct Cervical Spine Wo Contrast  Result Date: 08/19/2017 CLINICAL DATA:  72 year old female found on floor by family. Last seen 3 days ago. Initial encounter. EXAM: CT HEAD WITHOUT CONTRAST CT CERVICAL SPINE WITHOUT CONTRAST TECHNIQUE: Multidetector CT imaging of the head and cervical spine was performed following the standard protocol without intravenous contrast. Multiplanar CT image reconstructions of the cervical spine were also generated. COMPARISON:  07/02/2017 MR. 07/01/2016 CT head and cervical spine FINDINGS: CT HEAD FINDINGS Brain: Exam is motion degraded. Resolution of previously noted left-sided chronic subdural hematoma. No new intracranial hemorrhage noted. Chronic microvascular changes without CT evidence of large acute infarct. Global atrophy. No intracranial mass lesion noted on this unenhanced exam. Vascular: Vascular calcifications Skull: No skull fracture Sinuses/Orbits: No acute orbital abnormality. Visualized paranasal sinuses are clear. Mastoid air cells and middle ear cavities are clear. Temporomandibular joint degenerative changes. Other: Left parietal scalp hematoma. CT CERVICAL SPINE FINDINGS Alignment: Curvature cervical spine convex left. Skull base and vertebrae: No cervical spine fracture noted. Soft tissues and spinal canal: No abnormal prevertebral soft tissue swelling. Disc levels: Multilevel cervical spondylotic changes most prominent C5-6 level. Upper chest:  Scarring lung apices. Other: Bilateral carotid bifurcation calcifications. IMPRESSION: Exams are motion degraded. Interval clearing of previously noted left-sided chronic subdural hematoma. No new intracranial hemorrhage noted. Chronic microvascular changes without CT evidence of large acute infarct. Atrophy. No cervical spine fracture detected. Curvature cervical spine convex left. No abnormal prevertebral soft tissue swelling. Cervical  spondylotic changes similar to prior exam. Electronically Signed   By: Genia Del M.D.   On: 08/19/2017 19:47   Dg Chest Port 1 View  Result Date: 08/19/2017 CLINICAL DATA:  Found down today. EXAM: PORTABLE CHEST 1 VIEW COMPARISON:  Chest radiograph July 01, 2017 FINDINGS: Cardiac silhouette is mildly enlarged and unchanged. Calcified aortic knob. Chronic interstitial changes without pleural effusion or focal consolidation. No pneumothorax. Stable apical pleural thickening. Osteopenia. Severe LEFT shoulder osteoarthrosis, incompletely imaged. IMPRESSION: Stable cardiomegaly and chronic interstitial changes. Aortic Atherosclerosis (ICD10-I70.0). Electronically Signed   By: Elon Alas M.D.   On: 08/19/2017 18:26   [place] Ekg Site Rite  Result Date: 08/21/2017 If Site Rite image not attached, placement could not be confirmed due to current cardiac rhythm.       Scheduled Meds: . pantoprazole (PROTONIX) IV  40 mg Intravenous Q12H  . sodium chloride flush  10-40 mL Intracatheter Q12H  . thiamine injection  100 mg Intravenous Daily   Continuous Infusions: . acyclovir Stopped (08/21/17 0243)  . ampicillin (OMNIPEN) IV    . cefTRIAXone (ROCEPHIN)  IV Stopped (08/21/17 0128)  . vancomycin       LOS: 2 days    Time spent: 25 minutes    Edwin Dada, MD [facility] 08/21/2017, 1:07 PM     Pager [phone] --- please page though AMION:  [email or web address] Password [email or web address] If 7PM-7AM, please contact night-coverage

## 2017-08-21 NOTE — Progress Notes (Signed)
OT Cancellation Note  Patient Details Name: Angelica Kelley MRN: [id number] DOB: 07/05/1945   Cancelled Treatment:    Reason Eval/Treat Not Completed: Patient at procedure or test/ unavailable.  Pt undergoing PICC placement.  [name], OTR/L [phone]   [name] M 08/21/2017, 11:45 AM

## 2017-08-22 ENCOUNTER — Inpatient Hospital Stay (HOSPITAL_COMMUNITY): Payer: Medicare Other

## 2017-08-22 DIAGNOSIS — R000 Tachycardia, unspecified: Secondary | ICD-10-CM

## 2017-08-22 DIAGNOSIS — E876 Hypokalemia: Secondary | ICD-10-CM

## 2017-08-22 DIAGNOSIS — T796XXA Traumatic ischemia of muscle, initial encounter: Secondary | ICD-10-CM

## 2017-08-22 LAB — BASIC METABOLIC PANEL
ANION GAP: 13 (ref 5–15)
BUN: 19 mg/dL (ref 6–20)
CO2: 21 mmol/L — ABNORMAL LOW (ref 22–32)
Calcium: 8.4 mg/dL — ABNORMAL LOW (ref 8.9–10.3)
Chloride: 110 mmol/L (ref 101–111)
Creatinine, Ser: 0.85 mg/dL (ref 0.44–1.00)
GLUCOSE: 118 mg/dL — AB (ref 65–99)
POTASSIUM: 2.7 mmol/L — AB (ref 3.5–5.1)
Sodium: 144 mmol/L (ref 135–145)

## 2017-08-22 LAB — GLUCOSE, CSF: GLUCOSE CSF: 73 mg/dL — AB (ref 40–70)

## 2017-08-22 LAB — CBC
HCT: 28 % — ABNORMAL LOW (ref 36.0–46.0)
Hemoglobin: 9.1 g/dL — ABNORMAL LOW (ref 12.0–15.0)
MCH: 29.1 pg (ref 26.0–34.0)
MCHC: 32.5 g/dL (ref 30.0–36.0)
MCV: 89.5 fL (ref 78.0–100.0)
PLATELETS: 199 10*3/uL (ref 150–400)
RBC: 3.13 MIL/uL — ABNORMAL LOW (ref 3.87–5.11)
RDW: 14.7 % (ref 11.5–15.5)
WBC: 10.7 10*3/uL — AB (ref 4.0–10.5)

## 2017-08-22 LAB — GLUCOSE, CAPILLARY
GLUCOSE-CAPILLARY: 106 mg/dL — AB (ref 65–99)
GLUCOSE-CAPILLARY: 119 mg/dL — AB (ref 65–99)
GLUCOSE-CAPILLARY: 119 mg/dL — AB (ref 65–99)
GLUCOSE-CAPILLARY: 124 mg/dL — AB (ref 65–99)
GLUCOSE-CAPILLARY: 90 mg/dL (ref 65–99)
Glucose-Capillary: 121 mg/dL — ABNORMAL HIGH (ref 65–99)
Glucose-Capillary: 94 mg/dL (ref 65–99)

## 2017-08-22 LAB — PROTEIN, CSF: Total  Protein, CSF: 31 mg/dL (ref 15–45)

## 2017-08-22 LAB — CSF CELL COUNT WITH DIFFERENTIAL
RBC Count, CSF: 37 /mm3 — ABNORMAL HIGH
RBC Count, CSF: 69 /mm3 — ABNORMAL HIGH
Tube #: 3
Tube #: 1
WBC CSF: 0 /mm3 (ref 0–5)
WBC CSF: 3 /mm3 (ref 0–5)

## 2017-08-22 LAB — MAGNESIUM: Magnesium: 2.3 mg/dL (ref 1.7–2.4)

## 2017-08-22 LAB — CK: Total CK: 1535 U/L — ABNORMAL HIGH (ref 38–234)

## 2017-08-22 LAB — POTASSIUM: POTASSIUM: 2.7 mmol/L — AB (ref 3.5–5.1)

## 2017-08-22 MED ORDER — ALBUTEROL SULFATE (2.5 MG/3ML) 0.083% IN NEBU
2.5000 mg | INHALATION_SOLUTION | Freq: Four times a day (QID) | RESPIRATORY_TRACT | Status: DC | PRN
  Administered 2017-08-22: 2.5 mg via RESPIRATORY_TRACT
  Filled 2017-08-22: qty 3

## 2017-08-22 MED ORDER — POTASSIUM CHLORIDE CRYS ER 20 MEQ PO TBCR
40.0000 meq | EXTENDED_RELEASE_TABLET | Freq: Three times a day (TID) | ORAL | Status: DC
  Administered 2017-08-22: 40 meq via ORAL
  Filled 2017-08-22: qty 2

## 2017-08-22 MED ORDER — POTASSIUM CHLORIDE 10 MEQ/50ML IV SOLN
10.0000 meq | INTRAVENOUS | Status: AC
  Administered 2017-08-22 (×4): 10 meq via INTRAVENOUS
  Filled 2017-08-22 (×4): qty 50

## 2017-08-22 MED ORDER — POTASSIUM CHLORIDE CRYS ER 20 MEQ PO TBCR: 40.0000 meq | EXTENDED_RELEASE_TABLET | Freq: Once | ORAL | Status: DC

## 2017-08-22 MED ORDER — POTASSIUM CHLORIDE CRYS ER 20 MEQ PO TBCR
40.0000 meq | EXTENDED_RELEASE_TABLET | Freq: Two times a day (BID) | ORAL | Status: DC
  Administered 2017-08-22: 40 meq via ORAL
  Filled 2017-08-22: qty 2

## 2017-08-22 MED ORDER — LIDOCAINE HCL (PF) 1 % IJ SOLN: 5.0000 mL | Freq: Once | INTRAMUSCULAR | Status: DC

## 2017-08-22 MED ORDER — DEXTROSE 5 % IV SOLN
600.0000 mg | Freq: Three times a day (TID) | INTRAVENOUS | Status: DC
  Administered 2017-08-23 – 2017-08-24 (×5): 600 mg via INTRAVENOUS
  Filled 2017-08-22 (×7): qty 12

## 2017-08-22 MED ORDER — TRAZODONE HCL 50 MG PO TABS
50.0000 mg | ORAL_TABLET | Freq: Every day | ORAL | Status: DC
  Administered 2017-08-22 – 2017-08-25 (×3): 50 mg via ORAL
  Filled 2017-08-22 (×3): qty 1

## 2017-08-22 MED ORDER — FUROSEMIDE 10 MG/ML IJ SOLN
20.0000 mg | Freq: Once | INTRAMUSCULAR | Status: AC
  Administered 2017-08-22: 20 mg via INTRAVENOUS
  Filled 2017-08-22: qty 2

## 2017-08-22 MED ORDER — METOPROLOL TARTRATE 5 MG/5ML IV SOLN
5.0000 mg | Freq: Three times a day (TID) | INTRAVENOUS | Status: DC | PRN
  Administered 2017-08-22: 5 mg via INTRAVENOUS
  Filled 2017-08-22: qty 5

## 2017-08-22 MED ORDER — POTASSIUM CHLORIDE 10 MEQ/100ML IV SOLN
10.0000 meq | INTRAVENOUS | Status: AC
Start: 2017-08-22 — End: 2017-08-22
  Administered 2017-08-22 (×3): 10 meq via INTRAVENOUS
  Filled 2017-08-22 (×3): qty 100

## 2017-08-22 MED ORDER — CLONAZEPAM 0.25 MG PO TBDP
0.2500 mg | ORAL_TABLET | Freq: Two times a day (BID) | ORAL | Status: DC
  Administered 2017-08-22 – 2017-08-24 (×5): 0.25 mg via ORAL
  Filled 2017-08-22 (×5): qty 1

## 2017-08-22 NOTE — Social Work (Signed)
CSW spoke with pt brother Nadara Mustard, will leave packet of bed offers on counter in pt room. PASSR still pending.   CSW also discussed pt family desire for ALF in the future for pt. Discussed inability to place pt in ALF from hospital, but will leave information for pt family for them to look over it during pt stay in SNF.   Alexander Mt, [place] Work [phone]

## 2017-08-22 NOTE — Progress Notes (Signed)
This Probation officer received call from Telemetry @ 1750 stating HR 130 , notified MD orders received for EKG 12 lead . Obtained EKG 12 lead texted MD results received call back from MD @1814  stated unless Cardiologist is walking by to read and interpret EKG reading less not go by that and stated he would be up to see patient, informed MD HR sustaining in 130s.

## 2017-08-22 NOTE — Progress Notes (Signed)
CRITICAL VALUE ALERT  Critical Value:  K+ 2.7  Date & Time Notied:  08/22/17 at 01:29am  Provider Notified: Opyd  Orders Received/Actions taken: no orders given at this time

## 2017-08-22 NOTE — Procedures (Signed)
CLINICAL DATA: [Encephalopathy, possible meningitis] EXAM: DIAGNOSTIC LUMBAR PUNCTURE UNDER FLUOROSCOPIC GUIDANCE FLUOROSCOPY TIME: Fluoroscopy Time: [0 minutes, 30 seconds] Radiation Exposure Index (if provided by the fluoroscopic device): [3.4 mGy] Number of Acquired Spot Images: [0] PROCEDURE: I discussed the risks (including hemorrhage, infection, headache, and nerve damage, among others), benefits, and alternatives to fluoroscopically guided lumbar puncture with the patient's niece by telephone.  We specifically discussed the high technical likelihood of success of the procedure. The patient's niece understood understood and elected for the patient to undergo the procedure.  Telephone consent policies were followed.   Standard time-out was employed. Following sterile skin prep and local anesthetic administration consisting of 1 percent lidocaine, a 22 gauge spinal needle was advanced without difficulty into the thecal sac at the at the [L2-3] level. Clear CSF was returned.  Opening pressure was [not obtained].   12 cc of clear CSF was collected. The needle was subsequently removed and the skin cleansed and bandaged. No immediate complications were observed.     IMPRESSION: [ 1. Fluoroscopically guided lumbar puncture yielding 12 cc of clear CSF.  No complications were observed.  ]

## 2017-08-22 NOTE — Progress Notes (Signed)
Pt's had a run of SVT. HR 150-160. Asked for pt's K+ to be redrawn. Pt was given 7 bags of potassium as well as PO potassium between 08/30/17 and 08/21/17. Pt's last potassium drawn at 0344 on 4/10 was still 2.5. Paged MD. Will continue to monitor pt

## 2017-08-22 NOTE — Progress Notes (Signed)
Pt continually pulling leads off and trying to get out of bed. Pt has a new onset of confusion and has to be reoriented frequently. Pt found to be wheezing, tachypneic resp 30-35, O2 sat 89-92 on 3L [place]. MD notified, will continue to monitor.

## 2017-08-22 NOTE — Progress Notes (Signed)
PROGRESS NOTE    DALISA FORRER  VOJ:[id number] DOB: 10/30/1945 DOA: 08/19/2017 PCP: Unk Pinto, MD      Brief Narrative:  Angelica Kelley is a 72 y.o. female with a history of bipolar disorder, TIA, prediabetes, HTN, HLD who presented to the ED with AMS, found on the ground by her niece with widespread bruising, grossly altered from baseline with dried blood noted from the mouth. On evaluation she has been afebrile, hypoxic and encephalopathic. CT head revealed interval clearing of chronic SDH, no acute findings on atrophic background and C-spine showed no fracture. CXR unremarkable, urinalysis suggestive of UTI. Hgb down from 12.4 in March to 9.7 on arrival and has continued to trend downward to 8.2 with negative FOBT, INR 1.1, no features of hemolysis. Creatinine up from 1 to 1.8 with CK elevated to 6,984. Due to ongoing agitation, ativan was given and soft mittens placed though LP is unobtainable. MRI and EEG are ordered and empiric coverage for meningitis/encephalitis started per ID phone recommendations. GI is consulted.      Assessment & Plan:  Acute encephalopathy Differential includes benzo overdose, meningitis.  Exam militates militates against infection.  Paradoxically worsened in ER with benzos per report, suggesting this is not withdrawal. No focal neuro deficits to suggest stroke.  This morning, she is quite better.  Worsening overnight but resolved this morning.  Waxing and waning.  No focal signs.  No headache. -Obtain LP with cell count, Gram stain, protein and glucose -Continue vancomycin, ceftriaxone, ampicillin, acyclovir for now -If WBC count CSF is not elevated, will stop antibiotics -Delirium precautions, avoid benzos -Trazodone tonight for sleep is ordered   Pulmonary edema Acute on chronic diastolic CHF Echo shows normal LVEF, grade 2 diastolic dysfunction, elevated filling pressures. -Lasix IV once today - Replete K, repeat Lasix when  able  Tachycardia -Obtain ECG  Hypokalemia Mag normal -Continue repletion K oral and IV  Anemia -Consult GI, appreciate recommendations -Continue PPI for now  Rhabdomyolysis Nontraumatic, found down.  Resolving.   -hold IV fluids given edema  Bipolar disorder       DVT prophylaxis: SCDs Code Status: FULL Family Communication: None present MDM and disposition Plan: The below labs and imaging reports were reviewed.  The patient was admitted with acute encephalopathy, brain failure, altered mental status, otherwise unexplained.  There are numerous diagnostic considerations, workup is pending.  She has now has congestive heart failure, acute and new.  Requiring IV diuresis, IV left leg replacement.   Consultants:   GI  Procedures:   None  Antimicrobials:   Vanc  Ceftriaxone  Ampicillin  Acyclovir    Subjective: Still with dry mouth, seems oriented to person place and time.  No headache, neck pain, fever.  No current confusion.  No rash.  No new cough, sputum production.    Objective: Vitals:   08/22/17 0355 08/22/17 0400 08/22/17 0414 08/22/17 0741  BP: 111/65 (!) 131/112 117/64 132/73  Pulse: 78 64 83 80  Resp: (!) 27 (!) 32  (!) 23  Temp:   97.7 F (36.5 C) 98 F (36.7 C)  TempSrc:   Oral Oral  SpO2: 100% 94% 99% 100%  Weight:      Height:       No intake or output data in the 24 hours ending 08/22/17 0752 Filed Weights   08/19/17 1750  Weight: 59 kg (130 lb)    Examination: General appearance:   Adult female, sitting up in a chair, no acute distress, interactive,  some psychomotor slowing mild. HEENT: Anicteric, conjunctival pink, lids and lashes normal, no nasal deformity, discharge, or epistaxis, lips dry, OP dry, no oral lesions, dentition normal, neck supple no meningismus. Skin: Skin is warm and dry, no suspicious rashes, lesions, induration, redness. Cardiac: Heart rate regular, no murmurs, no lower extremity edema. Respiratory:  Respiratory rate is increased, there are rales at the bases.  No wheezes. Abdomen: Abdomen is soft, no tenderness to palpation, no ascites, distention, or HSM. MSK: No deformities or effusions of the large joints of the upper or lower extremities bilaterally Neuro: Moves all extremities, with 5 5 strength in upper and lower extremities bilaterally.  Extraocular movements intact.  PERRLA.  Speech fluent.Marland Kitchen    Psych: Sensorium is intact and she is responding to questions, mild psychomotor slowing only, attention somewhat diminished, but oriented to person place and time.  Judgment and insight appear normal.   Data Reviewed: I have personally reviewed following labs and imaging studies:  CBC: Recent Labs  Lab 08/19/17 1807  08/20/17 0357 08/20/17 1036 08/20/17 1806 08/20/17 2217 08/21/17 0344  WBC 14.3*   < > 10.3 9.6 9.0 10.9* 13.3*  NEUTROABS 12.7*  --   --   --   --   --   --   HGB 9.7*   < > 8.2* 8.2* 8.7* 9.0* 9.4*  HCT 30.6*   < > 26.5* 25.7* 26.8* 28.6* 28.8*  MCV 87.9   < > 88.9 88.9 89.9 89.4 89.2  PLT 192   < > 158 163 165 177 213   < > = values in this interval not displayed.   Basic Metabolic Panel: Recent Labs  Lab 08/19/17 1807 08/19/17 1810 08/19/17 1837 08/20/17 1200 08/20/17 2217 08/21/17 0344 08/22/17 0100  NA 142 142  --  143 146* 146* 144  K 2.8* 2.8*  --  2.4* 2.5* 2.5* 2.7*  CL 102 103  --  113* 113* 111 110  CO2 23  --   --  20* 20* 19* 21*  GLUCOSE 121* 122*  --  94 93 99 118*  BUN 51* 42*  --  35* 27* 22* 19  CREATININE 1.85* 1.80*  --  1.21* 1.04* 0.97 0.85  CALCIUM 9.3  --   --  8.1* 8.6* 8.5* 8.4*  MG  --   --  2.7*  --   --  2.4 2.3   GFR: Estimated Creatinine Clearance: 50.1 mL/min (by C-G formula based on SCr of 0.85 mg/dL). Liver Function Tests: Recent Labs  Lab 08/19/17 1807  AST 121*  ALT 43  ALKPHOS 61  BILITOT 1.0  PROT 6.0*  ALBUMIN 3.6   No results for input(s): LIPASE, AMYLASE in the last 168 hours. Recent Labs  Lab  08/19/17 2128  AMMONIA 16   Coagulation Profile: Recent Labs  Lab 08/19/17 1807  INR 1.10   Cardiac Enzymes: Recent Labs  Lab 08/19/17 1807 08/19/17 2128 08/20/17 0357 08/20/17 1200 08/21/17 0344  CKTOTAL 5,453*  --  6,984*  --  3,967*  TROPONINI  --  0.05*  --  0.05*  --    BNP (last 3 results) No results for input(s): PROBNP in the last 8760 hours. HbA1C: No results for input(s): HGBA1C in the last 72 hours. CBG: Recent Labs  Lab 08/21/17 1654 08/21/17 2005 08/22/17 0018 08/22/17 0410 08/22/17 0745  GLUCAP 121* 129* 119* 119* 121*   Lipid Profile: No results for input(s): CHOL, HDL, LDLCALC, TRIG, CHOLHDL, LDLDIRECT in the last 72 hours. Thyroid  Function Tests: Recent Labs    08/19/17 2128  TSH 1.617   Anemia Panel: Recent Labs    08/19/17 2128  VITAMINB12 1,042*  FOLATE 11.0  FERRITIN 119  TIBC 291  IRON 31  RETICCTPCT 1.9   Urine analysis:    Component Value Date/Time   COLORURINE AMBER (A) 08/19/2017 1835   APPEARANCEUR CLOUDY (A) 08/19/2017 1835   LABSPEC 1.023 08/19/2017 1835   PHURINE 5.0 08/19/2017 1835   GLUCOSEU NEGATIVE 08/19/2017 1835   HGBUR LARGE (A) 08/19/2017 1835   BILIRUBINUR NEGATIVE 08/19/2017 [place] 08/19/2017 1835   PROTEINUR 30 (A) 08/19/2017 1835   UROBILINOGEN 1 06/09/2014 1531   NITRITE NEGATIVE 08/19/2017 1835   LEUKOCYTESUR NEGATIVE 08/19/2017 1835   Sepsis Labs: @LABRCNTIP (procalcitonin:4,lacticacidven:4)  ) Recent Results (from the past 240 hour(s))  Blood Culture (routine x 2)     Status: None (Preliminary result)   Collection Time: 08/19/17  6:00 PM  Result Value Ref Range Status   Specimen Description BLOOD RIGHT ANTECUBITAL  Final   Special Requests   Final    BOTTLES DRAWN AEROBIC AND ANAEROBIC Blood Culture adequate volume   Culture   Final    NO GROWTH 2 DAYS Performed at [facility], [place] [address] [place], [place] [address]    Report Status PENDING  Incomplete   Blood Culture (routine x 2)     Status: None (Preliminary result)   Collection Time: 08/19/17  6:26 PM  Result Value Ref Range Status   Specimen Description BLOOD LEFT ANTECUBITAL  Final   Special Requests   Final    BOTTLES DRAWN AEROBIC AND ANAEROBIC Blood Culture adequate volume   Culture   Final    NO GROWTH 2 DAYS Performed at [facility], [place] [address] [place], [place] [address]    Report Status PENDING  Incomplete  Urine culture     Status: None   Collection Time: 08/19/17  6:35 PM  Result Value Ref Range Status   Specimen Description URINE, CATHETERIZED  Final   Special Requests NONE  Final   Culture   Final    NO GROWTH Performed at [facility], [place] [address], [place], [place] [address]    Report Status 08/20/2017 FINAL  Final  MRSA PCR Screening     Status: None   Collection Time: 08/20/17  6:01 PM  Result Value Ref Range Status   MRSA by PCR NEGATIVE NEGATIVE Final    Comment:        The GeneXpert MRSA Assay (FDA approved for NASAL specimens only), is one component of a comprehensive MRSA colonization surveillance program. It is not intended to diagnose MRSA infection nor to guide or monitor treatment for MRSA infections. Performed at [facility], [place] [address], [place], [place] [address]          Radiology Studies: Ct Abdomen Pelvis Wo Contrast  Result Date: 08/20/2017 CLINICAL DATA:  Anemia.  Recent fall. EXAM: CT ABDOMEN AND PELVIS WITHOUT CONTRAST TECHNIQUE: Multidetector CT imaging of the abdomen and pelvis was performed following the standard protocol without IV contrast. COMPARISON:  Lumbar spine MRI 09/29/2013. FINDINGS: Lower chest: Patchy ground-glass opacities in both lung bases with interlobular septal thickening and small pleural effusions. Decreased attenuation of the blood pool compatible with anemia. Coronary artery atherosclerosis. Hepatobiliary: No focal liver abnormality is seen. There is moderate distention of the  gallbladder without gross wall thickening or acute inflammatory changes. There is no  biliary dilatation. Pancreas: Unremarkable. Spleen: Unremarkable. Adrenals/Urinary Tract: Mild bilateral adrenal gland thickening. Punctate right and possibly left renal calculi. No hydronephrosis. Unremarkable bladder. Stomach/Bowel: The stomach is within normal limits. A moderately large amount of stool is present in the colon and rectum. There is no evidence of bowel obstruction. Vascular/Lymphatic: Abdominal aortic atherosclerosis without aneurysm. No enlarged lymph nodes. Reproductive: Status post hysterectomy. No adnexal masses. Other: No intraperitoneal free fluid.  No retroperitoneal hematoma. Musculoskeletal: Multiple old left rib fractures. Chronic L3 and L4 compression fractures. Bilateral L4 pars defects with 9 mm anterolisthesis of L4 on L5. 6 mm anterolisthesis of L3 on L4. IMPRESSION: 1. No acute abnormality identified in the abdomen or pelvis. 2. Ground-glass opacities in both lung bases. Potential etiologies include acute causes such as edema, infection, and pulmonary hemorrhage as well as chronic processes including interstitial lung disease. 3. Small pleural effusions. 4. Nonobstructing nephrolithiasis. 5. Moderately large amount of stool in the colon and rectum. No bowel obstruction. Electronically Signed   By: Logan Bores M.D.   On: 08/20/2017 17:54   Dg Chest Port 1 View  Result Date: 08/22/2017 CLINICAL DATA:  Wheezing and tachypnea. EXAM: PORTABLE CHEST 1 VIEW COMPARISON:  Radiograph 08/19/2017 FINDINGS: Right upper extremity PICC with tip at the atrial caval junction. Unchanged cardiomegaly and mediastinal contours. Again seen aortic atherosclerosis. Increased interstitial thickening from prior exam with possible Kerley B-lines. Scattered atelectasis at the left lung base and right mid lung. No confluent consolidation. No large pleural effusion, left costophrenic angle excluded from the field of view.  No pneumothorax. IMPRESSION: 1. Unchanged cardiomegaly and aortic atherosclerosis. Suggestion of new mild pulmonary edema superimposed on chronic interstitial thickening. 2. Scattered atelectasis. Electronically Signed   By: Jeb Levering M.D.   On: 08/22/2017 04:14   [place] Ekg Site Rite  Result Date: 08/21/2017 If Site Rite image not attached, placement could not be confirmed due to current cardiac rhythm.       Scheduled Meds: . furosemide  20 mg Intravenous Once  . pantoprazole (PROTONIX) IV  40 mg Intravenous Q12H  . potassium chloride  40 mEq Oral BID  . sodium chloride flush  10-40 mL Intracatheter Q12H  . thiamine injection  100 mg Intravenous Daily   Continuous Infusions: . acyclovir Stopped (08/22/17 0300)  . ampicillin (OMNIPEN) IV Stopped (08/22/17 0551)  . cefTRIAXone (ROCEPHIN)  IV Stopped (08/22/17 0110)  . potassium chloride    . vancomycin Stopped (08/21/17 2255)     LOS: 3 days    Time spent: 74 minutes    Edwin Dada, MD [facility] 08/22/2017, 6:08 PM     Pager [phone] --- please page though AMION:  [email or web address] Password [email or web address] If 7PM-7AM, please contact night-coverage

## 2017-08-22 NOTE — Care Management Note (Signed)
Case Management Note  Patient Details  Name: Angelica Kelley MRN: [id number] Date of Birth: 1946-01-11  Subjective/Objective:   Pt admitted on 08/19/17 with acute encephalopathy, acute renal failure, and rhabdomyolysis.  PTA, pt independent,  resided at home alone.                   Action/Plan: PT/OT recommending SNF for rehab at dc; CSW consulted to facilitate dc to SNF upon medical stability.   Expected Discharge Date:  (Pending)               Expected Discharge Plan:  Skilled Nursing Facility  In-House Referral:  Clinical Social Work  Discharge planning Services  CM Consult  Post Acute Care Choice:    Choice offered to:     DME Arranged:    DME Agency:     HH Arranged:    [place] Agency:     Status of Service:  In process, will continue to follow  If discussed at Long Length of Stay Meetings, dates discussed:    Additional Comments:  Reinaldo Raddle, RN, BSN  Trauma/Neuro ICU Case Manager [phone]

## 2017-08-22 NOTE — Progress Notes (Signed)
Physical Therapy Treatment Patient Details Name: Angelica Kelley MRN: [id number] DOB: Aug 02, 1945 Today's Date: 08/22/2017    History of Present Illness Pt is a 72 y/o female admitted after being found down in home and also with acute encephalopathy. CT of head showed interval clearing of L subdural hematoma and CT of C spine negative for acute abnormality. PMH includes bipolar disorder, TIA, pre-diabetes, and HTN.     PT Comments    Pt has improved, but still needs encouragement and at least minimal assist for all basic mobility.  Emphasis on transition to EOB, transfers and gait stability/stamina.   Follow Up Recommendations  SNF;Supervision/Assistance - 24 hour     Equipment Recommendations       Recommendations for Other Services       Precautions / Restrictions Precautions Precautions: Fall    Mobility  Bed Mobility Overal bed mobility: Needs Assistance Bed Mobility: Supine to Sit     Supine to sit: Min assist     General bed mobility comments: assist to come forward to EOB  Transfers Overall transfer level: Needs assistance Equipment used: Rolling walker (2 wheeled) Transfers: Sit to/from Stand Sit to Stand: Min assist         General transfer comment: steady assist  Ambulation/Gait Ambulation/Gait assistance: Min assist Ambulation Distance (Feet): 30 Feet Assistive device: Rolling walker (2 wheeled) Gait Pattern/deviations: Step-through pattern     General Gait Details: generally steady in the RW.  Distance limited by dyspnea and fluctuating SpO2's..  Generally sats stayed greater that 90%   Stairs            Wheelchair Mobility    Modified Rankin (Stroke Patients Only)       Balance   Sitting-balance support: Feet supported Sitting balance-Leahy Scale: Fair     Standing balance support: Single extremity supported;Bilateral upper extremity supported;During functional activity Standing balance-Leahy Scale: Poor Standing balance  comment: requires UE support and min A.                             Cognition Arousal/Alertness: Awake/alert Behavior During Therapy: Anxious;Flat affect Overall Cognitive Status: Impaired/Different from baseline                   Orientation Level: Disoriented to;Situation;Time;Place Current Attention Level: Sustained Memory: Decreased short-term memory Following Commands: Follows one step commands consistently;Follows multi-step commands with increased time Safety/Judgement: Decreased awareness of safety;Decreased awareness of deficits   Problem Solving: Difficulty sequencing;Requires verbal cues;Requires tactile cues        Exercises Other Exercises Other Exercises: Warm up exercise to decrease LE stiffness and pain.    General Comments        Pertinent Vitals/Pain Pain Assessment: No/denies pain Faces Pain Scale: Hurts little more Pain Location: Extremities with movement  Pain Descriptors / Indicators: Grimacing Pain Intervention(s): Monitored during session    Home Living                      Prior Function            PT Goals (current goals can now be found in the care plan section) Acute Rehab PT Goals Patient Stated Goal: to go home  PT Goal Formulation: Patient unable to participate in goal setting Time For Goal Achievement: 09/03/17 Potential to Achieve Goals: Fair Progress towards PT goals: Progressing toward goals    Frequency    Min 2X/week  PT Plan Current plan remains appropriate    Co-evaluation              AM-PAC PT "6 Clicks" Daily Activity  Outcome Measure  Difficulty turning over in bed (including adjusting bedclothes, sheets and blankets)?: Unable Difficulty moving from lying on back to sitting on the side of the bed? : Unable Difficulty sitting down on and standing up from a chair with arms (e.g., wheelchair, bedside commode, etc,.)?: Unable Help needed moving to and from a bed to chair  (including a wheelchair)?: A Little Help needed walking in hospital room?: A Little Help needed climbing 3-5 steps with a railing? : A Lot 6 Click Score: 11    End of Session   Activity Tolerance: Patient limited by lethargy Patient left: in bed;with call bell/phone within reach Nurse Communication: Mobility status PT Visit Diagnosis: History of falling (Z91.81);Muscle weakness (generalized) (M62.81);Unsteadiness on feet (R26.81)     Time: 1035-1100 PT Time Calculation (min) (ACUTE ONLY): 25 min  Charges:  $Gait Training: 8-22 mins $Therapeutic Activity: 8-22 mins                    G Codes:       Sep 18, 2017  Donnella Sham, PT [phone]  (pager)   Tessie Fass Yehonatan Grandison 18-Sep-2017, 12:21 PM

## 2017-08-23 ENCOUNTER — Encounter (HOSPITAL_COMMUNITY): Payer: Self-pay | Admitting: Physician Assistant

## 2017-08-23 DIAGNOSIS — I10 Essential (primary) hypertension: Secondary | ICD-10-CM

## 2017-08-23 DIAGNOSIS — R748 Abnormal levels of other serum enzymes: Secondary | ICD-10-CM

## 2017-08-23 DIAGNOSIS — I5033 Acute on chronic diastolic (congestive) heart failure: Secondary | ICD-10-CM

## 2017-08-23 DIAGNOSIS — I480 Paroxysmal atrial fibrillation: Secondary | ICD-10-CM

## 2017-08-23 LAB — OCCULT BLOOD X 1 CARD TO LAB, STOOL: FECAL OCCULT BLD: NEGATIVE

## 2017-08-23 LAB — CBC
HEMATOCRIT: 30.1 % — AB (ref 36.0–46.0)
Hemoglobin: 9.8 g/dL — ABNORMAL LOW (ref 12.0–15.0)
MCH: 29 pg (ref 26.0–34.0)
MCHC: 32.6 g/dL (ref 30.0–36.0)
MCV: 89.1 fL (ref 78.0–100.0)
PLATELETS: 238 10*3/uL (ref 150–400)
RBC: 3.38 MIL/uL — ABNORMAL LOW (ref 3.87–5.11)
RDW: 14.6 % (ref 11.5–15.5)
WBC: 10.6 10*3/uL — ABNORMAL HIGH (ref 4.0–10.5)

## 2017-08-23 LAB — BASIC METABOLIC PANEL
Anion gap: 14 (ref 5–15)
BUN: 15 mg/dL (ref 6–20)
CO2: 22 mmol/L (ref 22–32)
CREATININE: 0.74 mg/dL (ref 0.44–1.00)
Calcium: 8.6 mg/dL — ABNORMAL LOW (ref 8.9–10.3)
Chloride: 110 mmol/L (ref 101–111)
GFR calc Af Amer: >60 mL/min (ref >60)
GLUCOSE: 100 mg/dL — AB (ref 65–99)
Potassium: 3.1 mmol/L — ABNORMAL LOW (ref 3.5–5.1)
Sodium: 146 mmol/L — ABNORMAL HIGH (ref 135–145)

## 2017-08-23 LAB — TROPONIN I
TROPONIN I: 0.7 ng/mL — AB (ref <0.03)
Troponin I: 1.14 ng/mL (ref <0.03)
Troponin I: 0.58 ng/mL (ref <0.03)

## 2017-08-23 LAB — GLUCOSE, CAPILLARY
GLUCOSE-CAPILLARY: 94 mg/dL (ref 65–99)
GLUCOSE-CAPILLARY: 116 mg/dL — AB (ref 65–99)
GLUCOSE-CAPILLARY: 126 mg/dL — AB (ref 65–99)
Glucose-Capillary: 93 mg/dL (ref 65–99)
Glucose-Capillary: 133 mg/dL — ABNORMAL HIGH (ref 65–99)

## 2017-08-23 LAB — POTASSIUM: POTASSIUM: 3 mmol/L — AB (ref 3.5–5.1)

## 2017-08-23 MED ORDER — POTASSIUM CHLORIDE CRYS ER 20 MEQ PO TBCR
20.0000 meq | EXTENDED_RELEASE_TABLET | Freq: Three times a day (TID) | ORAL | Status: DC
  Administered 2017-08-23 – 2017-08-24 (×2): 20 meq via ORAL
  Filled 2017-08-23 (×3): qty 1

## 2017-08-23 MED ORDER — ASPIRIN 81 MG PO CHEW
324.0000 mg | CHEWABLE_TABLET | Freq: Once | ORAL | Status: AC
  Administered 2017-08-23: 324 mg via ORAL
  Filled 2017-08-23: qty 4

## 2017-08-23 MED ORDER — POTASSIUM CHLORIDE CRYS ER 20 MEQ PO TBCR
40.0000 meq | EXTENDED_RELEASE_TABLET | Freq: Three times a day (TID) | ORAL | Status: DC
  Administered 2017-08-23 (×2): 40 meq via ORAL
  Filled 2017-08-23: qty 2

## 2017-08-23 MED ORDER — FUROSEMIDE 10 MG/ML IJ SOLN
20.0000 mg | Freq: Once | INTRAMUSCULAR | Status: AC
  Administered 2017-08-23: 20 mg via INTRAVENOUS
  Filled 2017-08-23: qty 2

## 2017-08-23 MED ORDER — SPIRONOLACTONE 25 MG PO TABS
25.0000 mg | ORAL_TABLET | Freq: Every day | ORAL | Status: DC
  Administered 2017-08-23 – 2017-08-24 (×2): 25 mg via ORAL
  Filled 2017-08-23 (×2): qty 1

## 2017-08-23 NOTE — Progress Notes (Signed)
CRITICAL VALUE ALERT  Critical Value:  Troponin 1.14  Date & Time Notied:  0230 4/12  Provider Notified: T. Opyd  Orders Received/Actions taken: EKG, chewable asprin, will trend troponin

## 2017-08-23 NOTE — Progress Notes (Signed)
Upon reviewing patient's home meds, she is noted to be on several antipsychotic meds. MD notified and will address restarting medications in the morning.

## 2017-08-23 NOTE — Care Management Important Message (Signed)
Important Message  Patient Details  Name: Angelica Kelley MRN: [id number] Date of Birth: [birth year]   Medicare Important Message Given:  Yes    Orbie Pyo 08/23/2017, 2:01 PM

## 2017-08-23 NOTE — Progress Notes (Signed)
PROGRESS NOTE    Angelica Kelley  WHQ:[id number] DOB: 1945/07/13 DOA: 08/19/2017 PCP: Unk Pinto, MD      Brief Narrative:  Angelica Kelley is a 72 y.o. female with a history of bipolar disorder, TIA, prediabetes, HTN, HLD and suspected recent SDH who presented to the ED with AMS, found on the ground by her niece with widespread bruising, grossly altered from baseline with dried blood noted from the mouth. On evaluation she has been afebrile, hypoxic and encephalopathic. CT head revealed interval clearing of chronic SDH, no acute findings on atrophic background and C-spine showed no fracture. CXR unremarkable, urinalysis suggestive of UTI. Hgb down from 12.4 in March to 9.7 on arrival and has continued to trend downward to 8.2 with negative FOBT, INR 1.1, no features of hemolysis. Creatinine up from 1 to 1.8 with CK elevated to 6,984. Due to ongoing agitation, ativan was given and soft mittens placed though LP is unobtainable. MRI and EEG are ordered and empiric coverage for meningitis/encephalitis started per ID phone recommendations. GI is consulted.      Assessment & Plan:  Acute encephalopathy Suspect benzo overdose superimposed on progressing dementia.  Initial differential included bacterial meningitis and HSV encephalitis.  FOrmer has now been ruled out.  Red cells in tubes 1 and 3 noted.  No headache, which makes SAH extremely unlikely.  Re; HSV encephalitis, I have low suspicion, but with some persistent red cells I prefer to continue acyclovir until I can rule this out with PCR.  No signs of Xanax withdrawal.  Nothing focal to suggest stroke.  She had some sundowning, but overall is having improvement in symptoms.  No headache. -Continue acyclovir until HSV PCR negative -Delirium precautions, avoid benzos -Continue Trazodone for sleep  New onset atrial fibrillation CHADS2Vasc would be 4 (history of TIA, age, gender).  Patient is not a candidate for anticoagulation at present  primarily due to her inability to care for self at home and unwillingness to allow supervision (an ongong issue, APS involved).  If this were to change, considerations may change in the future, but not now.  Furthermore, it should be noted she had a suspected SDH over the winter.  Converted back to sinus overnight with correction of K. -K>4, mag>2  Elevated troponin Likely demand in setting of Afib, but with trop>1, we must cycle enzymes and consult cardiology re: pace of ischemic workup. -Trend troponins -Consult Cardiology, appreciate recs  Pulmonary edema Acute on chronic diastolic CHF Echo shows normal LVEF, grade 2 diastolic dysfunction, elevated filling pressures. -Repeat Lasix today -Aggressive K repletion  Hypokalemia Mag normal -Continue repletion K oral and IV  Anemia No signs of blood in stool. -Continue PPI for now, if FOBT negative, stop  Rhabdomyolysis Nontraumatic, found down.  Resolving.    Bipolar disorder   Dementia -PT/OT consult       DVT prophylaxis: SCDs Code Status: FULL Family Communication: None present MDM and disposition Plan: The below labs and imaging reports were reviewed and summarized above.  She wsa admitted with acute altered mental status, differential included extensive possible diagnoses, which we are currently still working up.  HSV sent today.  New onset Afib, ECG personally reviewed last night showed Afib, this morning repeat shows NSR.  New troponin elevation, suspect demand ischemia only.       Consultants:   GI  Cardiology  Procedures:   None  Antimicrobials:   Vanc 4/8 >> 4/11  Ceftriaxone 4/8 >> 4/11  Ampicillin 4/8 >> 4/11  Acyclovir  4/8 >>    Subjective: Complains of chest "funny" but denies pain, nausea, diaphoresis. Chest heavy, denies dyspnea.  Denies palpitations.  No headache.  No fever.  No more confusion overnight.    Objective: Vitals:   08/22/17 1623 08/22/17 1934 08/22/17 2323 08/23/17 0326    BP: 124/66     Pulse: 87     Resp: (!) 32     Temp: 97.9 F (36.6 C) 98 F (36.7 C) 99.3 F (37.4 C) 98.3 F (36.8 C)  TempSrc: Oral Oral Oral Oral  SpO2: 96%     Weight:      Height:        Intake/Output Summary (Last 24 hours) at 08/23/2017 8144 Last data filed at 08/23/2017 0300 Gross per 24 hour  Intake 750 ml  Output 800 ml  Net -50 ml   Filed Weights   08/19/17 1750  Weight: 59 kg (130 lb)    Examination: General appearance:   Adult female, lying in bed, on bedpan, no acute distress, interactive. HEENT: Eyes normal, lids and lashes and conjunctiva normal.  Hearing normal.  OP dry without oral lesions.  No nasal deformity or discharge.   Skin: Skin is warm and dry, numerous ecchymoses bruises scrapes and cuts, but no splinter hemorrhages or nodules.   Cardiac: RRR, no murmurs, 1+ bilateral pitting edema to mid shin. Respiratory: No tahcypnea, respiratory effort somewhat increaseed however.NO wheezes today, no rales. Abdomen: Adbomen soft, no TTP, no HSM. MSK: No deformities or effusions of the large joints of the upper or lower extremities bilaterally Neuro: MAE with 5/5 power.  PERRL, EOMI.  Speech fluent.   Psych: Oriented to person place and time.  Judgment and insight seem somewhat mpaired by demntia, but otherwise judgment normal.     Data Reviewed: I have personally reviewed following labs and imaging studies:  CBC: Recent Labs  Lab 08/19/17 1807  08/20/17 1036 08/20/17 1806 08/20/17 2217 08/21/17 0344 08/22/17 0749  WBC 14.3*   < > 9.6 9.0 10.9* 13.3* 10.7*  NEUTROABS 12.7*  --   --   --   --   --   --   HGB 9.7*   < > 8.2* 8.7* 9.0* 9.4* 9.1*  HCT 30.6*   < > 25.7* 26.8* 28.6* 28.8* 28.0*  MCV 87.9   < > 88.9 89.9 89.4 89.2 89.5  PLT 192   < > 163 165 177 213 199   < > = values in this interval not displayed.   Basic Metabolic Panel: Recent Labs  Lab 08/19/17 1837 08/20/17 1200 08/20/17 2217 08/21/17 0344 08/22/17 0100 08/22/17 1413  08/23/17 0200 08/23/17 0500  NA  --  143 146* 146* 144  --   --  146*  K  --  2.4* 2.5* 2.5* 2.7* 2.7* 3.0* 3.1*  CL  --  113* 113* 111 110  --   --  110  CO2  --  20* 20* 19* 21*  --   --  22  GLUCOSE  --  94 93 99 118*  --   --  100*  BUN  --  35* 27* 22* 19  --   --  15  CREATININE  --  1.21* 1.04* 0.97 0.85  --   --  0.74  CALCIUM  --  8.1* 8.6* 8.5* 8.4*  --   --  8.6*  MG 2.7*  --   --  2.4 2.3  --   --   --  GFR: Estimated Creatinine Clearance: 53.3 mL/min (by C-G formula based on SCr of 0.74 mg/dL). Liver Function Tests: Recent Labs  Lab 08/19/17 1807  AST 121*  ALT 43  ALKPHOS 61  BILITOT 1.0  PROT 6.0*  ALBUMIN 3.6   No results for input(s): LIPASE, AMYLASE in the last 168 hours. Recent Labs  Lab 08/19/17 2128  AMMONIA 16   Coagulation Profile: Recent Labs  Lab 08/19/17 1807  INR 1.10   Cardiac Enzymes: Recent Labs  Lab 08/19/17 1807 08/19/17 2128 08/20/17 0357 08/20/17 1200 08/21/17 0344 08/22/17 0754 08/23/17 0200  CKTOTAL 5,453*  --  6,984*  --  3,967* 1,535*  --   TROPONINI  --  0.05*  --  0.05*  --   --  1.14*   BNP (last 3 results) No results for input(s): PROBNP in the last 8760 hours. HbA1C: No results for input(s): HGBA1C in the last 72 hours. CBG: Recent Labs  Lab 08/22/17 1201 08/22/17 1627 08/22/17 1959 08/22/17 2322 08/23/17 0325  GLUCAP 94 124* 106* 90 94   Lipid Profile: No results for input(s): CHOL, HDL, LDLCALC, TRIG, CHOLHDL, LDLDIRECT in the last 72 hours. Thyroid Function Tests: No results for input(s): TSH, T4TOTAL, FREET4, T3FREE, THYROIDAB in the last 72 hours. Anemia Panel: No results for input(s): VITAMINB12, FOLATE, FERRITIN, TIBC, IRON, RETICCTPCT in the last 72 hours. Urine analysis:    Component Value Date/Time   COLORURINE AMBER (A) 08/19/2017 1835   APPEARANCEUR CLOUDY (A) 08/19/2017 1835   LABSPEC 1.023 08/19/2017 1835   PHURINE 5.0 08/19/2017 1835   GLUCOSEU NEGATIVE 08/19/2017 1835   HGBUR  LARGE (A) 08/19/2017 1835   BILIRUBINUR NEGATIVE 08/19/2017 [place] 08/19/2017 1835   PROTEINUR 30 (A) 08/19/2017 1835   UROBILINOGEN 1 06/09/2014 1531   NITRITE NEGATIVE 08/19/2017 [place] 08/19/2017 1835   Sepsis Labs: @LABRCNTIP (procalcitonin:4,lacticacidven:4)  ) Recent Results (from the past 240 hour(s))  Blood Culture (routine x 2)     Status: None (Preliminary result)   Collection Time: 08/19/17  6:00 PM  Result Value Ref Range Status   Specimen Description BLOOD RIGHT ANTECUBITAL  Final   Special Requests   Final    BOTTLES DRAWN AEROBIC AND ANAEROBIC Blood Culture adequate volume   Culture   Final    NO GROWTH 3 DAYS Performed at [facility], [place] [address], [place], [place] [address]    Report Status PENDING  Incomplete  Blood Culture (routine x 2)     Status: None (Preliminary result)   Collection Time: 08/19/17  6:26 PM  Result Value Ref Range Status   Specimen Description BLOOD LEFT ANTECUBITAL  Final   Special Requests   Final    BOTTLES DRAWN AEROBIC AND ANAEROBIC Blood Culture adequate volume   Culture   Final    NO GROWTH 3 DAYS Performed at [facility], [place] [address], [place], [place] [address]    Report Status PENDING  Incomplete  Urine culture     Status: None   Collection Time: 08/19/17  6:35 PM  Result Value Ref Range Status   Specimen Description URINE, CATHETERIZED  Final   Special Requests NONE  Final   Culture   Final    NO GROWTH Performed at [facility], [place] [address], [place], [place] [address]    Report Status 08/20/2017 FINAL  Final  MRSA PCR Screening     Status: None   Collection Time: 08/20/17  6:01  PM  Result Value Ref Range Status   MRSA by PCR NEGATIVE NEGATIVE Final    Comment:        The GeneXpert MRSA Assay (FDA approved for NASAL specimens only), is one component of a comprehensive MRSA colonization surveillance program. It is not intended to diagnose  MRSA infection nor to guide or monitor treatment for MRSA infections. Performed at [facility], [place] [address], [place], [place] [address]   CSF culture     Status: None (Preliminary result)   Collection Time: 08/22/17  1:45 PM  Result Value Ref Range Status   Specimen Description CSF  Final   Special Requests NONE  Final   Gram Stain   Final    WBC PRESENT, PREDOMINANTLY MONONUCLEAR NO ORGANISMS SEEN CYTOSPIN SMEAR Performed at [facility], [place] [address] [place], [place] [address]    Culture PENDING  Incomplete   Report Status PENDING  Incomplete         Radiology Studies: Dg Chest Port 1 View  Result Date: 08/22/2017 CLINICAL DATA:  Wheezing and tachypnea. EXAM: PORTABLE CHEST 1 VIEW COMPARISON:  Radiograph 08/19/2017 FINDINGS: Right upper extremity PICC with tip at the atrial caval junction. Unchanged cardiomegaly and mediastinal contours. Again seen aortic atherosclerosis. Increased interstitial thickening from prior exam with possible Kerley B-lines. Scattered atelectasis at the left lung base and right mid lung. No confluent consolidation. No large pleural effusion, left costophrenic angle excluded from the field of view. No pneumothorax. IMPRESSION: 1. Unchanged cardiomegaly and aortic atherosclerosis. Suggestion of new mild pulmonary edema superimposed on chronic interstitial thickening. 2. Scattered atelectasis. Electronically Signed   By: Jeb Levering M.D.   On: 08/22/2017 04:14   [place] Ekg Site Rite  Result Date: 08/21/2017 If Site Rite image not attached, placement could not be confirmed due to current cardiac rhythm.  Dg Fluoro Guide Lumbar Puncture  Result Date: 08/22/2017 CLINICAL DATA:  Encephalopathy, possible meningitis EXAM: DIAGNOSTIC LUMBAR PUNCTURE UNDER FLUOROSCOPIC GUIDANCE FLUOROSCOPY TIME:  Fluoroscopy Time:  0 minutes, 30 seconds Radiation Exposure Index (if provided by the fluoroscopic device): 3.4 mGy Number of Acquired Spot Images: 0  PROCEDURE: I discussed the risks (including hemorrhage, infection, headache, and nerve damage, among others), benefits, and alternatives to fluoroscopically guided lumbar puncture with the patient's niece by telephone. We specifically discussed the high technical likelihood of success of the procedure. The patient's niece understood understood and elected for the patient to undergo the procedure. Telephone consent policies were followed. Standard time-out was employed. Following sterile skin prep and local anesthetic administration consisting of 1 percent lidocaine, a 22 gauge spinal needle was advanced without difficulty into the thecal sac at the at the L2-3 level. Clear CSF was returned. Opening pressure was not obtained. 12 cc of clear CSF was collected. The needle was subsequently removed and the skin cleansed and bandaged. No immediate complications were observed. IMPRESSION: 1. Fluoroscopically guided lumbar puncture yielding 12 cc of clear CSF. No complications were observed. Electronically Signed   By: Van Clines M.D.   On: 08/22/2017 14:20        Scheduled Meds: . clonazePAM  0.25 mg Oral BID  . lidocaine (PF)  5 mL Intradermal Once  . pantoprazole (PROTONIX) IV  40 mg Intravenous Q12H  . sodium chloride flush  10-40 mL Intracatheter Q12H  . thiamine injection  100 mg Intravenous Daily  . traZODone  50 mg Oral QHS   Continuous Infusions: . acyclovir 600 mg (08/23/17 0530)  LOS: 4 days    Time spent: 35 minutes    Edwin Dada, MD [facility] 08/23/2017, 9:13 AM     Pager [phone] --- please page though AMION:  [email or web address] Password [email or web address] If 7PM-7AM, please contact night-coverage

## 2017-08-23 NOTE — Progress Notes (Signed)
Pharmacy Antibiotic Note  Angelica Kelley is a 72 y.o. female admitted on 08/19/2017 with meningitis.  Pharmacy has been consulted for acyclovir dosing.  Renal function has dramatically improved- SCR down to 0.74 this morning with CrCl ~50-3mL/min.  Plan: Acyclovir 600mg  IV q8h- continuing until HSV PCR results come back F/U cultures, LOT, renal function, and clinical progression  Height: 5\' 4"  (162.6 cm) Weight: 130 lb (59 kg) IBW/kg (Calculated) : 54.7  Temp (24hrs), Avg:98.2 F (36.8 C), Min:97.6 F (36.4 C), Max:99.3 F (37.4 C)  Recent Labs  Lab 08/19/17 1810  08/19/17 2143  08/20/17 1200 08/20/17 1806 08/20/17 2217 08/21/17 0344 08/22/17 0100 08/22/17 0749 08/23/17 0500  WBC  --    < >  --    < >  --  9.0 10.9* 13.3*  --  10.7* 10.6*  CREATININE 1.80*  --   --   --  1.21*  --  1.04* 0.97 0.85  --  0.74  LATICACIDVEN 1.37  --  1.74  --   --   --   --   --   --   --   --    < > = values in this interval not displayed.    Estimated Creatinine Clearance: 53.3 mL/min (by C-G formula based on SCr of 0.74 mg/dL).    Allergies  Allergen Reactions  . Lipitor [Atorvastatin]     Fatigue  . Prednisone Other (See Comments)    Change in mental status    Antimicrobials this admission: Zosyn 4/8 >> 4/8 Vancomycin 4/8 >>4/11 Ceftriaxone 4/8 >>4/11 Acyclovir 4/8 >> Ampicillin 4/8 >>4/11  Dose adjustments this admission: 4/10- ampicillin and vancomycin increased d/t improvement in renal function 4/11: acyclovir increased with improvement in renal function  Microbiology results: 4/8 BCx: ngtd 4/8 UCx: neg 4/9 MRSA PCR: neg 4/11 CSF: ngtd  Thank you for allowing pharmacy to be a part of this patient's care.  Eithen Castiglia D. Inara Dike, PharmD, BCPS Clinical Pharmacist Clinical Phone for 08/23/2017 until 3:30pm: [phone] If after 3:30pm, please call main pharmacy at [phone] 08/23/2017 11:20 AM

## 2017-08-23 NOTE — Consult Note (Addendum)
Cardiology Consultation:   Kelley ID: Angelica Kelley; [id number]; 1946-02-01   Admit date: 08/19/2017 Date of Consult: 08/23/2017  Primary Care Provider: Unk Pinto, MD Primary Cardiologist: [place], Dr Acie Fredrickson Primary Electrophysiologist:  n/a   Kelley Profile:   Angelica Kelley is a 72 y.o. female with a hx of HTN, HLD, SEM, GERD, bipolar, TIA, pre-DM, ?SDH seen on CT 07/01/2017, who is being seen today for Angelica evaluation of elevated troponin, new onset atrial fib at Angelica request of Dr Eudelia Bunch.  History of Present Illness:   Angelica Kelley was admitted 04/08 with AMS, last seen normal 4 days before. She was found on Angelica floor. +Rhabdo w/ CK 5453, K+ 2.8, BUN/Cr 51/1.85, H&H 9.7/30.6, initial troponin negative. Possible UTI>>no, FOBT neg.   CK improved w/ hydration, but pt dx w/ D-CHF, IVF held. Troponin rechecked and is elevated, PAF seen on telemetry, cards asked to see.   Angelica Kelley does not remember having any chest pain. No hx CP prior to admission. Prior to admission, she was driving short distances, doing minor housework. She does not do heavier housework or laundry.   She denies CP or SOB w/ exertion pta. No hx LE edema, orthopnea or PND. She was having some leg pain before she came to [facility] hospital but is not specific about it being with exertion.  Her memory is poor, she has trouble recalling events pta. She goes out to eat with a neighbor regularly, stays home most of Angelica time. Had a physical within [facility] last year (she thinks)  Currently, she is having problems with SOB, but no chest pain. No palpitations. Denies presyncope or syncope at any time. Upset that she is having wheezing and a hoarse voice. Does not remember ever having any of these before.    Past Medical History:  Diagnosis Date  . Alcoholism ([place])   . Anxiety   . Depression   . Elevated hemoglobin A1c   . Fracture of right wrist   . GERD (gastroesophageal reflux disease)   . Heart murmur   . Hyperlipidemia   .  Hypertension   . IBS (irritable bowel syndrome)     Past Surgical History:  Procedure Laterality Date  . ABDOMINAL HYSTERECTOMY  1991  . APPENDECTOMY    . BREAST SURGERY Left 1989   [facility]  . CARPAL TUNNEL RELEASE Right 10/18/2014   Procedure: CARPAL TUNNEL RELEASE;  Surgeon: Dorna Leitz, MD;  Location: [facility];  Service: Orthopedics;  Laterality: Right;  . [place]  . NECK SURGERY  Remote    Ruptured disc, per Kelley. Got infected.   . ORIF WRIST FRACTURE Right 10/18/2014   Procedure: OPEN REDUCTION INTERNAL FIXATION (ORIF) RIGHT WRIST FRACTURE;  Surgeon: Dorna Leitz, MD;  Location: [facility];  Service: Orthopedics;  Laterality: Right;  . TONSILLECTOMY  1965  . TUBAL LIGATION       Prior to Admission medications   Medication Sig Start Date End Date Taking? Authorizing Provider  ALPRAZolam Duanne Moron) 1 MG tablet Take 1 mg by mouth 2 (two) times daily. For anxiety.    Yes [provider]  aspirin EC 325 MG EC tablet Take 1 tablet (325 mg total) by mouth daily. 05/12/17  Yes Mikhail, [place], DO  bisoprolol-hydrochlorothiazide [facility]) 5-6.25 MG tablet take 1 tablet by mouth once daily 03/13/17  Yes Angelica Pinto, MD  buPROPion (WELLBUTRIN XL) 150 MG 24 hr tablet Take 450 mg by mouth daily.   Yes [provider]  Cholecalciferol (VITAMIN  D3) 5000 UNITS CAPS Take 5,000 Units by mouth daily. Only take 5 days  week   Yes [provider]  gabapentin (NEURONTIN) 300 MG capsule Take 300 mg by mouth at bedtime.  10/26/16  Yes [provider]  hydrochlorothiazide (MICROZIDE) 12.5 MG capsule take 1 capsule by mouth EVERY OTHER DAY daily for blood pressure and FLUID Kelley taking differently: Take 12.5 mg by mouth daily. take 1 capsule by mouth EVERY OTHER DAY daily for blood pressure and FLUID 05/16/17  Yes Vicie Mutters, PA-C  lamoTRIgine (LAMICTAL) 200 MG tablet Take 400 mg by mouth daily.  04/21/15  Yes [provider]  rosuvastatin (CRESTOR)  40 MG tablet take 1/2 to 1 tablet by mouth once daily Kelley taking differently: take 20 MG by mouth once daily 03/13/17  Yes Angelica Pinto, MD  sertraline (ZOLOFT) 100 MG tablet Take 200 mg by mouth daily.  04/11/15  Yes [provider]  Thiamine HCl (VITAMIN B-1) 250 MG tablet Take 250 mg by mouth 2 (two) times daily.   Yes [provider]    Inpatient Medications: Scheduled Meds: . clonazePAM  0.25 mg Oral BID  . lidocaine (PF)  5 mL Intradermal Once  . pantoprazole (PROTONIX) IV  40 mg Intravenous Q12H  . potassium chloride  40 mEq Oral TID  . sodium chloride flush  10-40 mL Intracatheter Q12H  . thiamine injection  100 mg Intravenous Daily  . traZODone  50 mg Oral QHS   Continuous Infusions: . acyclovir 600 mg (08/23/17 1304)   PRN Meds: acetaminophen **OR** acetaminophen (TYLENOL) oral liquid 160 mg/5 mL **OR** acetaminophen, albuterol, haloperidol lactate, metoprolol tartrate, sodium chloride flush  Allergies:    Allergies  Allergen Reactions  . Lipitor [Atorvastatin]     Fatigue  . Prednisone Other (See Comments)    Change in mental status    Social History:   Social History   Socioeconomic History  . Marital status: Divorced    Spouse name: Not on file  . Number of children: Not on file  . Years of education: Not on file  . Highest education level: Not on file  Occupational History  . Occupation: Retired  Scientific laboratory technician  . Financial resource strain: Not on file  . Food insecurity:    Worry: Not on file    Inability: Not on file  . Transportation needs:    Medical: Not on file    Non-medical: Not on file  Tobacco Use  . Smoking status: Former Smoker    Types: Cigarettes    Last attempt to quit: 05/14/2010    Years since quitting: 7.2  . Smokeless tobacco: Never Used  . Tobacco comment: second hand smoke exposure also  Substance and Sexual Activity  . Alcohol use: Not Currently    Comment: Quit 2002, denies she ever abused it.  .  Drug use: No  . Sexual activity: Never  Lifestyle  . Physical activity:    Days per week: Not on file    Minutes per session: Not on file  . Stress: Not on file  Relationships  . Social connections:    Talks on phone: Not on file    Gets together: Not on file    Attends religious service: Not on file    Active member of club or organization: Not on file    Attends meetings of clubs or organizations: Not on file    Relationship status: Not on file  . Intimate partner violence:  Fear of current or ex partner: Not on file    Emotionally abused: Not on file    Physically abused: Not on file    Forced sexual activity: Not on file  Other Topics Concern  . Not on file  Social History Narrative   07/05/17 Pt's niece, phone number [phone]. Pt lives alone at home, and doesn't use a cane or walker, is still pretty active. Never smoker.    1 daughter- deceased   Education- 21   Retired from [employer]   Caffeine- coffee,1 daily, soda 1 daily    Family History:   Family History  Problem Relation Age of Onset  . Heart disease Mother   . Diabetes Mother   . Leukemia Father   . Heart disease Brother   . Cancer Brother   . Parkinson's disease Brother    Family Status:  Family Status  Relation Name Status  . Mother  Deceased at age 63       ASHD, DM  . Father  Deceased at age 24       Leukemia  . Brother  Deceased at age 11       ASHD  . Brother  Deceased       Metastic Carcinoma  . Brother  Deceased at age 65       Parkinson's  . Sister  Alive  . Brother  Alive    ROS:  Please see Angelica history of present illness.  All other ROS reviewed and negative.     Physical Exam/Data:   Vitals:   08/23/17 0836 08/23/17 1200 08/23/17 1212 08/23/17 1230  BP:  (!) 174/93    Pulse:  (!) 105  98  Resp:  (!) 35  20  Temp: 97.6 F (36.4 C)  98.2 F (36.8 C)   TempSrc: Oral  Oral   SpO2:  96%  95%  Weight:      Height:        Intake/Output Summary (Last 24 hours)  at 08/23/2017 1525 Last data filed at 08/23/2017 1330 Gross per 24 hour  Intake 420 ml  Output 1000 ml  Net -580 ml   Filed Weights   08/19/17 1750  Weight: 130 lb (59 kg)   Body mass index is 22.31 kg/m.  General:  Well nourished, well developed, in no acute distress HEENT: normal Lymph: no adenopathy Neck: JVD seems elevated but difficult to assess 2nd respiratory variation and pt movement Endocrine:  No thryomegaly Vascular: No carotid bruits; 4/4 extremity pulses 2+, without bruits  Cardiac:  normal S1, S2; RRR; soft murmur  Lungs:  Few rales bilaterally, +exp wheezing, no rhonchi   Abd: soft, nontender, no hepatomegaly  Ext: no edema Musculoskeletal:  No deformities, BUE and BLE strength normal and equal Skin: warm and dry, multiple areas of ecchymosis  Neuro:  CNs 2-12 intact, no focal abnormalities noted Psych:  Normal affect   EKG:  Angelica EKG was personally reviewed and demonstrates:   04/11 at 18:03, Atrial fib, RVR w HR 134. Anterolateral ST changes, different from 08/19/2017 04/12 at 6:43 am, SR w  HR 90, ST changes improved  Telemetry:  Telemetry was personally reviewed and demonstrates: SR, ST with an episodes of atrial fib that last from seconds to over an hour, all spontaneously converted to SR  Relevant CV Studies:  ECHO: 08/20/2017 - Left ventricle: Angelica cavity size was normal. Wall thickness was   increased in a pattern of mild LVH. Systolic function was normal.  Angelica estimated ejection fraction was in Angelica range of 60% to 65%.   Doppler parameters are consistent with pseudonormal left   ventricular relaxation (grade 2 diastolic dysfunction). Angelica E/e&'   ratio is >15, suggesting elevated LV filling pressure. - Aortic valve: Trileaflet; mildly calcified leaflets. Mild   stenosis. Trivial regurgitation. Mean gradient (S): 16 mm Hg.   Peak gradient (S): 34 mm Hg. Valve area (VTI): 1.75 cm^2. Valve   area (Vmax): 1.43 cm^2. Valve area (Vmean): 1.54 cm^2. -  Mitral valve: Mildly thickened leaflets . There was mild   regurgitation. - Left atrium: Severely dilated. Angelica atrium was normal in size. - Right ventricle: Angelica cavity size was mildly dilated. Systolic   function was normal. - Right atrium: Moderately dilated. - Tricuspid valve: There was moderate regurgitation. - Pulmonary arteries: PA peak pressure: 46 mm Hg (S). - Inferior vena cava: Angelica vessel was normal in size. Angelica   respirophasic diameter changes were in Angelica normal range (>= 50%),   consistent with normal central venous pressure. Impressions: - Compared to a prior study in 2018, there are no signficant   changes. LV filling pressure is notably elevated.   Laboratory Data:  Chemistry Recent Labs  Lab 08/21/17 0344 08/22/17 0100 08/22/17 1413 08/23/17 0200 08/23/17 0500  NA 146* 144  --   --  146*  K 2.5* 2.7* 2.7* 3.0* 3.1*  CL 111 110  --   --  110  CO2 19* 21*  --   --  22  GLUCOSE 99 118*  --   --  100*  BUN 22* 19  --   --  15  CREATININE 0.97 0.85  --   --  0.74  CALCIUM 8.5* 8.4*  --   --  8.6*  GFRNONAA 56* >60  --   --  >60  GFRAA >60 >60  --   --  >60  ANIONGAP 16* 13  --   --  14    Lab Results  Component Value Date   ALT 43 08/19/2017   AST 121 (H) 08/19/2017   ALKPHOS 61 08/19/2017   BILITOT 1.0 08/19/2017   Hematology Recent Labs  Lab 08/21/17 0344 08/22/17 0749 08/23/17 0500  WBC 13.3* 10.7* 10.6*  RBC 3.23* 3.13* 3.38*  HGB 9.4* 9.1* 9.8*  HCT 28.8* 28.0* 30.1*  MCV 89.2 89.5 89.1  MCH 29.1 29.1 29.0  MCHC 32.6 32.5 32.6  RDW 14.2 14.7 14.6  PLT 213 199 238   Cardiac Enzymes Recent Labs  Lab 08/19/17 2128 08/20/17 1200 08/23/17 0200 08/23/17 0622 08/23/17 1222  TROPONINI 0.05* 0.05* 1.14* 0.70* 0.58*    Recent Labs  Lab 08/19/17 1808  TROPIPOC 0.02    TSH:  Lab Results  Component Value Date   TSH 1.617 08/19/2017   Lipids: Lab Results  Component Value Date   CHOL 174 08/07/2017   HDL 70 08/07/2017   LDLCALC  83 08/07/2017   TRIG 113 08/07/2017   CHOLHDL 2.5 08/07/2017   HgbA1c: Lab Results  Component Value Date   HGBA1C 5.4 08/07/2017   Magnesium:  Magnesium  Date Value Ref Range Status  08/22/2017 2.3 1.7 - 2.4 mg/dL Final    Comment:    Performed at [facility], [place] [address] [place], [place] [address]   Lab Results  Component Value Date   HGBA1C 5.4 08/07/2017     Radiology/Studies:  Ct Abdomen Pelvis Wo Contrast  Result Date: 08/20/2017 CLINICAL DATA:  Anemia.  Recent  fall. EXAM: CT ABDOMEN AND PELVIS WITHOUT CONTRAST TECHNIQUE: Multidetector CT imaging of Angelica abdomen and pelvis was performed following Angelica standard protocol without IV contrast. COMPARISON:  Lumbar spine MRI 09/29/2013. FINDINGS: Lower chest: Patchy ground-glass opacities in both lung bases with interlobular septal thickening and small pleural effusions. Decreased attenuation of Angelica blood pool compatible with anemia. Coronary artery atherosclerosis. Hepatobiliary: No focal liver abnormality is seen. There is moderate distention of Angelica gallbladder without gross wall thickening or acute inflammatory changes. There is no biliary dilatation. Pancreas: Unremarkable. Spleen: Unremarkable. Adrenals/Urinary Tract: Mild bilateral adrenal gland thickening. Punctate right and possibly left renal calculi. No hydronephrosis. Unremarkable bladder. Stomach/Bowel: Angelica stomach is within normal limits. A moderately large amount of stool is present in Angelica colon and rectum. There is no evidence of bowel obstruction. Vascular/Lymphatic: Abdominal aortic atherosclerosis without aneurysm. No enlarged lymph nodes. Reproductive: Status post hysterectomy. No adnexal masses. Other: No intraperitoneal free fluid.  No retroperitoneal hematoma. Musculoskeletal: Multiple old left rib fractures. Chronic L3 and L4 compression fractures. Bilateral L4 pars defects with 9 mm anterolisthesis of L4 on L5. 6 mm anterolisthesis of L3 on L4. IMPRESSION: 1.  No acute abnormality identified in Angelica abdomen or pelvis. 2. Ground-glass opacities in both lung bases. Potential etiologies include acute causes such as edema, infection, and pulmonary hemorrhage as well as chronic processes including interstitial lung disease. 3. Small pleural effusions. 4. Nonobstructing nephrolithiasis. 5. Moderately large amount of stool in Angelica colon and rectum. No bowel obstruction. Electronically Signed   By: Logan Bores M.D.   On: 08/20/2017 17:54   Ct Head Wo Contrast  Result Date: 08/19/2017 CLINICAL DATA:  72 year old female found on floor by family. Last seen 3 days ago. Initial encounter. EXAM: CT HEAD WITHOUT CONTRAST CT CERVICAL SPINE WITHOUT CONTRAST TECHNIQUE: Multidetector CT imaging of Angelica head and cervical spine was performed following Angelica standard protocol without intravenous contrast. Multiplanar CT image reconstructions of Angelica cervical spine were also generated. COMPARISON:  07/02/2017 MR. 07/01/2016 CT head and cervical spine FINDINGS: CT HEAD FINDINGS Brain: Exam is motion degraded. Resolution of previously noted left-sided chronic subdural hematoma. No new intracranial hemorrhage noted. Chronic microvascular changes without CT evidence of large acute infarct. Global atrophy. No intracranial mass lesion noted on this unenhanced exam. Vascular: Vascular calcifications Skull: No skull fracture Sinuses/Orbits: No acute orbital abnormality. Visualized paranasal sinuses are clear. Mastoid air cells and middle ear cavities are clear. Temporomandibular joint degenerative changes. Other: Left parietal scalp hematoma. CT CERVICAL SPINE FINDINGS Alignment: Curvature cervical spine convex left. Skull base and vertebrae: No cervical spine fracture noted. Soft tissues and spinal canal: No abnormal prevertebral soft tissue swelling. Disc levels: Multilevel cervical spondylotic changes most prominent C5-6 level. Upper chest: Scarring lung apices. Other: Bilateral carotid bifurcation  calcifications. IMPRESSION: Exams are motion degraded. Interval clearing of previously noted left-sided chronic subdural hematoma. No new intracranial hemorrhage noted. Chronic microvascular changes without CT evidence of large acute infarct. Atrophy. No cervical spine fracture detected. Curvature cervical spine convex left. No abnormal prevertebral soft tissue swelling. Cervical spondylotic changes similar to prior exam. Electronically Signed   By: Genia Del M.D.   On: 08/19/2017 19:47   Ct Cervical Spine Wo Contrast  Result Date: 08/19/2017 CLINICAL DATA:  72 year old female found on floor by family. Last seen 3 days ago. Initial encounter. EXAM: CT HEAD WITHOUT CONTRAST CT CERVICAL SPINE WITHOUT CONTRAST TECHNIQUE: Multidetector CT imaging of Angelica head and cervical spine was performed following Angelica standard protocol without intravenous contrast.  Multiplanar CT image reconstructions of Angelica cervical spine were also generated. COMPARISON:  07/02/2017 MR. 07/01/2016 CT head and cervical spine FINDINGS: CT HEAD FINDINGS Brain: Exam is motion degraded. Resolution of previously noted left-sided chronic subdural hematoma. No new intracranial hemorrhage noted. Chronic microvascular changes without CT evidence of large acute infarct. Global atrophy. No intracranial mass lesion noted on this unenhanced exam. Vascular: Vascular calcifications Skull: No skull fracture Sinuses/Orbits: No acute orbital abnormality. Visualized paranasal sinuses are clear. Mastoid air cells and middle ear cavities are clear. Temporomandibular joint degenerative changes. Other: Left parietal scalp hematoma. CT CERVICAL SPINE FINDINGS Alignment: Curvature cervical spine convex left. Skull base and vertebrae: No cervical spine fracture noted. Soft tissues and spinal canal: No abnormal prevertebral soft tissue swelling. Disc levels: Multilevel cervical spondylotic changes most prominent C5-6 level. Upper chest: Scarring lung apices. Other:  Bilateral carotid bifurcation calcifications. IMPRESSION: Exams are motion degraded. Interval clearing of previously noted left-sided chronic subdural hematoma. No new intracranial hemorrhage noted. Chronic microvascular changes without CT evidence of large acute infarct. Atrophy. No cervical spine fracture detected. Curvature cervical spine convex left. No abnormal prevertebral soft tissue swelling. Cervical spondylotic changes similar to prior exam. Electronically Signed   By: Genia Del M.D.   On: 08/19/2017 19:47   Dg Chest Port 1 View  Result Date: 08/22/2017 CLINICAL DATA:  Wheezing and tachypnea. EXAM: PORTABLE CHEST 1 VIEW COMPARISON:  Radiograph 08/19/2017 FINDINGS: Right upper extremity PICC with tip at Angelica atrial caval junction. Unchanged cardiomegaly and mediastinal contours. Again seen aortic atherosclerosis. Increased interstitial thickening from prior exam with possible Kerley B-lines. Scattered atelectasis at Angelica left lung base and right mid lung. No confluent consolidation. No large pleural effusion, left costophrenic angle excluded from Angelica field of view. No pneumothorax. IMPRESSION: 1. Unchanged cardiomegaly and aortic atherosclerosis. Suggestion of new mild pulmonary edema superimposed on chronic interstitial thickening. 2. Scattered atelectasis. Electronically Signed   By: Jeb Levering M.D.   On: 08/22/2017 04:14   Dg Chest Port 1 View  Result Date: 08/19/2017 CLINICAL DATA:  Found down today. EXAM: PORTABLE CHEST 1 VIEW COMPARISON:  Chest radiograph July 01, 2017 FINDINGS: Cardiac silhouette is mildly enlarged and unchanged. Calcified aortic knob. Chronic interstitial changes without pleural effusion or focal consolidation. No pneumothorax. Stable apical pleural thickening. Osteopenia. Severe LEFT shoulder osteoarthrosis, incompletely imaged. IMPRESSION: Stable cardiomegaly and chronic interstitial changes. Aortic Atherosclerosis (ICD10-I70.0). Electronically Signed   By:  Elon Alas M.D.   On: 08/19/2017 18:26   [place] Ekg Site Rite  Result Date: 08/21/2017 If Site Rite image not attached, placement could not be confirmed due to current cardiac rhythm.  Dg Fluoro Guide Lumbar Puncture  Result Date: 08/22/2017 CLINICAL DATA:  Encephalopathy, possible meningitis EXAM: DIAGNOSTIC LUMBAR PUNCTURE UNDER FLUOROSCOPIC GUIDANCE FLUOROSCOPY TIME:  Fluoroscopy Time:  0 minutes, 30 seconds Radiation Exposure Index (if provided by Angelica fluoroscopic device): 3.4 mGy Number of Acquired Spot Images: 0 PROCEDURE: I discussed Angelica risks (including hemorrhage, infection, headache, and nerve damage, among others), benefits, and alternatives to fluoroscopically guided lumbar puncture with Angelica Kelley's niece by telephone. We specifically discussed Angelica high technical likelihood of success of Angelica procedure. Angelica Kelley's niece understood understood and elected for Angelica Kelley to undergo Angelica procedure. Telephone consent policies were followed. Standard time-out was employed. Following sterile skin prep and local anesthetic administration consisting of 1 percent lidocaine, a 22 gauge spinal needle was advanced without difficulty into Angelica thecal sac at Angelica at Angelica L2-3 level. Clear CSF was returned.  Opening pressure was not obtained. 12 cc of clear CSF was collected. Angelica needle was subsequently removed and Angelica skin cleansed and bandaged. No immediate complications were observed. IMPRESSION: 1. Fluoroscopically guided lumbar puncture yielding 12 cc of clear CSF. No complications were observed. Electronically Signed   By: Van Clines M.D.   On: 08/22/2017 14:20    Assessment and Plan:   1. Acute diastolic CHF - happened after IVF for Rhabdo and ARI - sx noted after she began having episodes of Afib - Rx with IV Lasix 20 mg 04/11 and 04/12 - volume status improved after Lasix - will add Aldactone 25 mg to keep fluid off and help retain K+ - need I/O and daily wts  2. Elevated  troponin - no hx CP - initial troponin in Angelica setting of Rhabdo was negative - when Afib episodes began, checked again and was elevated  - EF normal on echo with no WMA - will need ischemic eval, timing unclear  3. PAF - CHA2DS2VASc=7 (age x 1, female, HTN, CHF, TIA x 2) - however, pt has significant fall risk w multiple falls pta - she had SDH 06/2016 with no known fall, increasing Angelica risk of spontaneous bleed - continue to eval for this, right now, risk anticoag>benefits  Otherwise per IM Principal Problem:   Acute encephalopathy Active Problems:   Bipolar depression ([place])   ARF (acute renal failure) ([place])   Hypokalemia   Rhabdomyolysis     For questions or updates, please contact [facility] Please consult [email or web address] for contact info under Cardiology/STEMI.   Signed, Rosaria Ferries, PA-C  08/23/2017 3:25 PM   Attending Note:   Angelica Kelley was seen and examined.  Agree with assessment and plan as noted above.  Changes made to Angelica above note as needed.  Kelley seen and independently examined with Rosaria Ferries, PA .   We discussed all aspects of Angelica encounter. I agree with Angelica assessment and plan as stated above.  1.  Acute on chronic diastolic congestive heart failure: This occurred after she received IV fluids for treatment of her her rhabdomyolysis.  She had some acute renal insufficiency.  She is received Lasix and is overall feeling a bit better.  I have noted that her potassium level is persistently low.  She may have high renin hypertension.  I think she will do well with Angelica addition of Aldactone.  2.  Elevated troponin level: This was in Angelica setting of rapid atrial fibrillation following an episode of rhabdomyolysis.  She denies any episodes of chest pain.  She did have some ST segment changes with her atrial fibrillation but these may have been rate related.  At present she is completely pain-free.  She has very minimal nonspecific ST changes.  At this  point I am not sure if she is a candidate for heart catheterization.  We need to see how functional she is.  She has a history of having several episodes of acute encephalopathy.   she is been found down in her house on several occasions.  She has a history of frequent falls and may wind up in a skilled nursing facility.  3.  Paroxysmal atrial fibrillation: Angelica Kelley was noted to have paroxysmal atrial fibrillation following an episode of rhabdomyolysis and acute diastolic congestive heart failure. CHA2DS2VASc=7 (age x 1, female, HTN, CHF, TIA x 2  I am very concerned about her history of falling.  In examination of her skin, she has multiple bruises over her arms  and legs and buttocks.  These bruises are of different ages indicating that she is fallen multiple times and in multiple ways..  She has a history of a subdural hematoma that was presumably due to a fall.  At this point I would not consider her a candidate for anticoagulation therapy unless we can find and treat because of her frequent falls and being "found down".  4.  Essential hypertension: She has a history of hypertension and has had markedly elevated potassium levels.  I suspect that she has a high renin hypertension.  We will add Aldactone 25 mg a day.   I have spent a total of 40 minutes with Kelley reviewing hospital  notes , telemetry, EKGs, labs and examining Kelley as well as establishing an assessment and plan that was discussed with Angelica Kelley. > 50% of time was spent in direct Kelley care.   Thayer Headings, Brooke Bonito., MD, [facility] 08/23/2017, 5:26 PM [address]  [place] Pager [phone]

## 2017-08-24 DIAGNOSIS — N179 Acute kidney failure, unspecified: Secondary | ICD-10-CM

## 2017-08-24 LAB — HERPES SIMPLEX VIRUS(HSV) DNA BY PCR
HSV 1 DNA: NEGATIVE
HSV 2 DNA: NEGATIVE

## 2017-08-24 LAB — BASIC METABOLIC PANEL
Anion gap: 9 (ref 5–15)
BUN: 15 mg/dL (ref 6–20)
CALCIUM: 8.5 mg/dL — AB (ref 8.9–10.3)
CHLORIDE: 108 mmol/L (ref 101–111)
CO2: 25 mmol/L (ref 22–32)
CREATININE: 0.64 mg/dL (ref 0.44–1.00)
GFR calc Af Amer: >60 mL/min (ref >60)
Glucose, Bld: 103 mg/dL — ABNORMAL HIGH (ref 65–99)
Potassium: 3.1 mmol/L — ABNORMAL LOW (ref 3.5–5.1)
SODIUM: 142 mmol/L (ref 135–145)

## 2017-08-24 LAB — CULTURE, BLOOD (ROUTINE X 2)
CULTURE: NO GROWTH
Culture: NO GROWTH
SPECIAL REQUESTS: ADEQUATE
Special Requests: ADEQUATE

## 2017-08-24 LAB — GLUCOSE, CAPILLARY
GLUCOSE-CAPILLARY: 100 mg/dL — AB (ref 65–99)
GLUCOSE-CAPILLARY: 90 mg/dL (ref 65–99)
Glucose-Capillary: 114 mg/dL — ABNORMAL HIGH (ref 65–99)
Glucose-Capillary: 114 mg/dL — ABNORMAL HIGH (ref 65–99)
Glucose-Capillary: 89 mg/dL (ref 65–99)
Glucose-Capillary: 97 mg/dL (ref 65–99)
Glucose-Capillary: 97 mg/dL (ref 65–99)

## 2017-08-24 LAB — CBC
HCT: 29.5 % — ABNORMAL LOW (ref 36.0–46.0)
HEMOGLOBIN: 9.3 g/dL — AB (ref 12.0–15.0)
MCH: 27.9 pg (ref 26.0–34.0)
MCHC: 31.5 g/dL (ref 30.0–36.0)
MCV: 88.6 fL (ref 78.0–100.0)
PLATELETS: 223 10*3/uL (ref 150–400)
RBC: 3.33 MIL/uL — ABNORMAL LOW (ref 3.87–5.11)
RDW: 14.5 % (ref 11.5–15.5)
WBC: 8.6 10*3/uL (ref 4.0–10.5)

## 2017-08-24 MED ORDER — LAMOTRIGINE 150 MG PO TABS
400.0000 mg | ORAL_TABLET | Freq: Every day | ORAL | Status: DC
  Administered 2017-08-24 – 2017-08-27 (×4): 400 mg via ORAL
  Filled 2017-08-24 (×4): qty 1

## 2017-08-24 MED ORDER — FUROSEMIDE 10 MG/ML IJ SOLN
40.0000 mg | Freq: Once | INTRAMUSCULAR | Status: AC
  Administered 2017-08-25: 40 mg via INTRAVENOUS
  Filled 2017-08-24: qty 4

## 2017-08-24 MED ORDER — SERTRALINE HCL 100 MG PO TABS
200.0000 mg | ORAL_TABLET | Freq: Every day | ORAL | Status: DC
  Administered 2017-08-24 – 2017-08-27 (×4): 200 mg via ORAL
  Filled 2017-08-24 (×4): qty 2

## 2017-08-24 MED ORDER — CLONAZEPAM 0.25 MG PO TBDP
0.2500 mg | ORAL_TABLET | Freq: Every day | ORAL | Status: DC
  Administered 2017-08-25: 0.25 mg via ORAL
  Filled 2017-08-24: qty 1

## 2017-08-24 MED ORDER — POTASSIUM CHLORIDE CRYS ER 20 MEQ PO TBCR
40.0000 meq | EXTENDED_RELEASE_TABLET | Freq: Once | ORAL | Status: AC
  Administered 2017-08-24: 40 meq via ORAL
  Filled 2017-08-24: qty 2

## 2017-08-24 MED ORDER — VITAMIN B-1 100 MG PO TABS
100.0000 mg | ORAL_TABLET | Freq: Every day | ORAL | Status: DC
  Administered 2017-08-24 – 2017-08-27 (×4): 100 mg via ORAL
  Filled 2017-08-24 (×4): qty 1

## 2017-08-24 MED ORDER — BUPROPION HCL ER (XL) 150 MG PO TB24
450.0000 mg | ORAL_TABLET | Freq: Every day | ORAL | Status: DC
  Administered 2017-08-24 – 2017-08-25 (×2): 450 mg via ORAL
  Filled 2017-08-24 (×2): qty 3

## 2017-08-24 NOTE — Progress Notes (Signed)
PROGRESS NOTE    Angelica Kelley  JGG:[id number] DOB: 07/15/1945 DOA: 08/19/2017 PCP: Unk Pinto, MD      Brief Narrative:  Angelica Kelley is a 72 y.o. female with a history of bipolar disorder, TIA, prediabetes, HTN, HLD and suspected recent SDH who presented to the ED with AMS, found on the ground by her niece with widespread bruising, grossly altered from baseline with dried blood noted from the mouth. On evaluation she has been afebrile, hypoxic and encephalopathic. CT head revealed interval clearing of chronic SDH, no acute findings on atrophic background and C-spine showed no fracture. CXR unremarkable, urinalysis suggestive of UTI. Hgb down from 12.4 in March to 9.7 on arrival and has continued to trend downward to 8.2 with negative FOBT, INR 1.1, no features of hemolysis. Creatinine up from 1 to 1.8 with CK elevated to 6,984. Due to ongoing agitation, ativan was given and soft mittens placed though LP is unobtainable. MRI and EEG are ordered and empiric coverage for meningitis/encephalitis started per ID phone recommendations. GI is consulted.      Assessment & Plan:  Acute encephalopathy Suspect benzo overdose superimposed on progressing dementia.  Initial differential included bacterial meningitis and HSV encephalitis.  FOrmer has now been ruled out.  Red cells in tubes 1 and 3 noted.  No headache, which makes SAH extremely unlikely.  Re; HSV encephalitis, I have low suspicion, but with some persistent red cells I prefer to continue acyclovir until I can rule this out with PCR.  No signs of Xanax withdrawal.  Nothing focal to suggest stroke.  She had some sundowning, but overall is having improvement in symptoms.  No headache.  Sleepy today, but improving.  HSV PCR negative. -D/c acyclovir -Delirium precautions, avoid benzos -Continue trazodone -Taper clonazepam   New onset atrial fibrillation CHADS2Vasc would be 4 (history of TIA, age, gender).  Patient is not a candidate for  anticoagulation at present primarily due to her inability to care for self at home and unwillingness to allow supervision (an ongong issue, APS involved).  If this were to change, considerations may change in the future, but not now.  Furthermore, it should be noted she had a suspected SDH over the winter.  Converted back to sinus overnight with correction of K.  Currently sinus, occasional brief periods of Afib. -K>4, mag>2 -Consult to cardiology, appreciate cares  Elevated troponin Appreciate cardiology input, ischemic work up deferred.   Pulmonary edema Acute on chronic diastolic CHF Echo shows normal LVEF, grade 2 diastolic dysfunction, elevated filling pressures, no RWMA. -Continue spironolactone -Monitor K -Repeat Lasix tomorrow -STrict I/Os, daily weights  Hypokalemia Mag normal -Continue oral K  Anemia No signs of blood in stool.  FOBT negative.  GI bleeding low likelihood.  Unclear etiology of anemia. -Stop PPI  Rhabdomyolysis Nontraumatic, found down.  Resolving.    Bipolar disorder -Restart lamotrigine, sertraline, bupropion  Dementia -PT/OT consult       DVT prophylaxis: SCDs Code Status: FULL Family Communication: None present MDM and disposition Plan: Below labs and imaging reports were reviewed and summarized above.  The patient was admitted with acute altered mental status, with a diffuse extensive differential diagnosis, rule out infection, HSV.  I have a strong suspicion that this was benzodiazepine overdose.  She also has new onset atrial fibrillation, for which anticoagulation is being deferred.   PT eval.  Continue diuresis.  Meds adjusted as above.  Likely home in 2-3 days.  Consultants:   GI  Cardiology  Procedures:   None  Antimicrobials:   Vanc 4/8 >> 4/11  Ceftriaxone 4/8 >> 4/11  Ampicillin 4/8 >> 4/11  Acyclovir  4/8 >> 4/13   Subjective: Very sleepy today, but still oriented.  No pain, nausea, diaphoresis.  No  chest discomfort, no dyspnea, no palpitations, no increased respiratory effort, no headache, no fever.  No delirium.  Objective: Vitals:   08/24/17 0400 08/24/17 0844 08/24/17 1227 08/24/17 1535  BP: 136/69 125/69 (!) 111/97 (!) 106/56  Pulse: (!) 101 94 98 98  Resp: (!) 34 (!) 37 (!) 27 (!) 22  Temp:  98.3 F (36.8 C) 98.4 F (36.9 C) 98.2 F (36.8 C)  TempSrc:  Oral Oral Oral  SpO2:  (!) 89% 93% 97%  Weight:      Height:        Intake/Output Summary (Last 24 hours) at 08/24/2017 1725 Last data filed at 08/24/2017 0800 Gross per 24 hour  Intake 448 ml  Output 400 ml  Net 48 ml   Filed Weights   08/19/17 1750  Weight: 59 kg (130 lb)    Examination: General appearance:   Female, lying in bed, sleepy, easily arousable.  Interactive. HEENT: Conjunctiva normal, lids and lashes normal, oropharynx moist, no oral lesions.  Lips and teeth normal.  Hearing normal Skin: Warm and dry, there are numerous bruises and scrapes and cuts and ecchymosis, but no suspicious rashes Cardiac: Heart rate tachycardic, regular, no murmurs.  No lower extremity edema. Respiratory: No snoring, no tachypnea, respiratory effort even, no wheezes or rales. Abdomen: Abdomen soft, no tenderness to palpation, no hepatosplenomegaly. MSK: No deformities or effusions of the large joints of the upper or lower extremities bilaterally Neuro: MAE with 5/5 power.  PERRL, EOMI.  Speech fluent.   Psych: Drowsy but arousable, and oriented to person, place, and time.  Judgment and insight seem somewhat impaired by dementia.  Attention normal   Data Reviewed: I have personally reviewed following labs and imaging studies:  CBC: Recent Labs  Lab 08/19/17 1807  08/20/17 2217 08/21/17 0344 08/22/17 0749 08/23/17 0500 08/24/17 0520  WBC 14.3*   < > 10.9* 13.3* 10.7* 10.6* 8.6  NEUTROABS 12.7*  --   --   --   --   --   --   HGB 9.7*   < > 9.0* 9.4* 9.1* 9.8* 9.3*  HCT 30.6*   < > 28.6* 28.8* 28.0* 30.1* 29.5*  MCV  87.9   < > 89.4 89.2 89.5 89.1 88.6  PLT 192   < > 177 213 199 238 223   < > = values in this interval not displayed.   Basic Metabolic Panel: Recent Labs  Lab 08/19/17 1837  08/20/17 2217 08/21/17 0344 08/22/17 0100 08/22/17 1413 08/23/17 0200 08/23/17 0500 08/24/17 0520  NA  --    < > 146* 146* 144  --   --  146* 142  K  --    < > 2.5* 2.5* 2.7* 2.7* 3.0* 3.1* 3.1*  CL  --    < > 113* 111 110  --   --  110 108  CO2  --    < > 20* 19* 21*  --   --  22 25  GLUCOSE  --    < > 93 99 118*  --   --  100* 103*  BUN  --    < > 27* 22* 19  --   --  15  15  CREATININE  --    < > 1.04* 0.97 0.85  --   --  0.74 0.64  CALCIUM  --    < > 8.6* 8.5* 8.4*  --   --  8.6* 8.5*  MG 2.7*  --   --  2.4 2.3  --   --   --   --    < > = values in this interval not displayed.   GFR: Estimated Creatinine Clearance: 53.3 mL/min (by C-G formula based on SCr of 0.64 mg/dL). Liver Function Tests: Recent Labs  Lab 08/19/17 1807  AST 121*  ALT 43  ALKPHOS 61  BILITOT 1.0  PROT 6.0*  ALBUMIN 3.6   No results for input(s): LIPASE, AMYLASE in the last 168 hours. Recent Labs  Lab 08/19/17 2128  AMMONIA 16   Coagulation Profile: Recent Labs  Lab 08/19/17 1807  INR 1.10   Cardiac Enzymes: Recent Labs  Lab 08/19/17 1807 08/19/17 2128 08/20/17 0357 08/20/17 1200 08/21/17 0344 08/22/17 0754 08/23/17 0200 08/23/17 0622 08/23/17 1222  CKTOTAL 5,453*  --  6,984*  --  3,967* 1,535*  --   --   --   TROPONINI  --  0.05*  --  0.05*  --   --  1.14* 0.70* 0.58*   BNP (last 3 results) No results for input(s): PROBNP in the last 8760 hours. HbA1C: No results for input(s): HGBA1C in the last 72 hours. CBG: Recent Labs  Lab 08/24/17 0012 08/24/17 0350 08/24/17 0846 08/24/17 1229 08/24/17 1539  GLUCAP 114* 97 100* 97 114*   Lipid Profile: No results for input(s): CHOL, HDL, LDLCALC, TRIG, CHOLHDL, LDLDIRECT in the last 72 hours. Thyroid Function Tests: No results for input(s): TSH,  T4TOTAL, FREET4, T3FREE, THYROIDAB in the last 72 hours. Anemia Panel: No results for input(s): VITAMINB12, FOLATE, FERRITIN, TIBC, IRON, RETICCTPCT in the last 72 hours. Urine analysis:    Component Value Date/Time   COLORURINE AMBER (A) 08/19/2017 1835   APPEARANCEUR CLOUDY (A) 08/19/2017 1835   LABSPEC 1.023 08/19/2017 1835   PHURINE 5.0 08/19/2017 1835   GLUCOSEU NEGATIVE 08/19/2017 1835   HGBUR LARGE (A) 08/19/2017 1835   BILIRUBINUR NEGATIVE 08/19/2017 [place] 08/19/2017 1835   PROTEINUR 30 (A) 08/19/2017 1835   UROBILINOGEN 1 06/09/2014 1531   NITRITE NEGATIVE 08/19/2017 [place] 08/19/2017 1835   Sepsis Labs: @LABRCNTIP (procalcitonin:4,lacticacidven:4)  ) Recent Results (from the past 240 hour(s))  Blood Culture (routine x 2)     Status: None   Collection Time: 08/19/17  6:00 PM  Result Value Ref Range Status   Specimen Description BLOOD RIGHT ANTECUBITAL  Final   Special Requests   Final    BOTTLES DRAWN AEROBIC AND ANAEROBIC Blood Culture adequate volume   Culture   Final    NO GROWTH 5 DAYS Performed at [facility], [place] [address], [place], [place] [address]    Report Status 08/24/2017 FINAL  Final  Blood Culture (routine x 2)     Status: None   Collection Time: 08/19/17  6:26 PM  Result Value Ref Range Status   Specimen Description BLOOD LEFT ANTECUBITAL  Final   Special Requests   Final    BOTTLES DRAWN AEROBIC AND ANAEROBIC Blood Culture adequate volume   Culture   Final    NO GROWTH 5 DAYS Performed at [facility], [place] [address] [place], [place] [address]    Report Status 08/24/2017 FINAL  Final  Urine culture     Status: None   Collection Time: 08/19/17  6:35 PM  Result Value Ref Range Status   Specimen Description URINE, CATHETERIZED  Final   Special Requests NONE  Final   Culture   Final    NO GROWTH Performed at [facility], [address], [place], [place] [address]    Report Status  08/20/2017 FINAL  Final  MRSA PCR Screening     Status: None   Collection Time: 08/20/17  6:01 PM  Result Value Ref Range Status   MRSA by PCR NEGATIVE NEGATIVE Final    Comment:        The GeneXpert MRSA Assay (FDA approved for NASAL specimens only), is one component of a comprehensive MRSA colonization surveillance program. It is not intended to diagnose MRSA infection nor to guide or monitor treatment for MRSA infections. Performed at [facility], [place] [address] [place], [place] [address]   CSF culture     Status: None (Preliminary result)   Collection Time: 08/22/17  1:45 PM  Result Value Ref Range Status   Specimen Description CSF  Final   Special Requests NONE  Final   Gram Stain   Final    WBC PRESENT, PREDOMINANTLY MONONUCLEAR NO ORGANISMS SEEN CYTOSPIN SMEAR    Culture   Final    NO GROWTH 2 DAYS Performed at [facility], [place] [address] [place], [place] [address]    Report Status PENDING  Incomplete         Radiology Studies: No results found.      Scheduled Meds: . buPROPion  450 mg Oral Daily  . [START ON 08/25/2017] clonazePAM  0.25 mg Oral QHS  . lamoTRIgine  400 mg Oral Daily  . sertraline  200 mg Oral Daily  . sodium chloride flush  10-40 mL Intracatheter Q12H  . spironolactone  25 mg Oral Daily  . thiamine  100 mg Oral Daily  . traZODone  50 mg Oral QHS   Continuous Infusions:    LOS: 5 days    Time spent: 35 minutes    Edwin Dada, MD [facility] 08/24/2017, 12:30 PM    Pager [phone] --- please page though AMION:  [email or web address] Password [email or web address] If 7PM-7AM, please contact night-coverage

## 2017-08-25 DIAGNOSIS — F0390 Unspecified dementia without behavioral disturbance: Secondary | ICD-10-CM

## 2017-08-25 DIAGNOSIS — I5031 Acute diastolic (congestive) heart failure: Secondary | ICD-10-CM

## 2017-08-25 DIAGNOSIS — F319 Bipolar disorder, unspecified: Secondary | ICD-10-CM

## 2017-08-25 LAB — BASIC METABOLIC PANEL
ANION GAP: 8 (ref 5–15)
BUN: 16 mg/dL (ref 6–20)
CHLORIDE: 111 mmol/L (ref 101–111)
CO2: 24 mmol/L (ref 22–32)
Calcium: 8.2 mg/dL — ABNORMAL LOW (ref 8.9–10.3)
Creatinine, Ser: 0.69 mg/dL (ref 0.44–1.00)
GFR calc Af Amer: >60 mL/min (ref >60)
GLUCOSE: 90 mg/dL (ref 65–99)
POTASSIUM: 3.5 mmol/L (ref 3.5–5.1)
Sodium: 143 mmol/L (ref 135–145)

## 2017-08-25 LAB — GLUCOSE, CAPILLARY
GLUCOSE-CAPILLARY: 86 mg/dL (ref 65–99)
GLUCOSE-CAPILLARY: 80 mg/dL (ref 65–99)
GLUCOSE-CAPILLARY: 133 mg/dL — AB (ref 65–99)
GLUCOSE-CAPILLARY: 117 mg/dL — AB (ref 65–99)
GLUCOSE-CAPILLARY: 97 mg/dL (ref 65–99)
Glucose-Capillary: 99 mg/dL (ref 65–99)

## 2017-08-25 LAB — CSF CULTURE W GRAM STAIN: Culture: NO GROWTH

## 2017-08-25 LAB — CBC
HCT: 28.1 % — ABNORMAL LOW (ref 36.0–46.0)
HEMOGLOBIN: 8.9 g/dL — AB (ref 12.0–15.0)
MCH: 28.7 pg (ref 26.0–34.0)
MCHC: 31.7 g/dL (ref 30.0–36.0)
MCV: 90.6 fL (ref 78.0–100.0)
PLATELETS: 207 10*3/uL (ref 150–400)
RBC: 3.1 MIL/uL — AB (ref 3.87–5.11)
RDW: 14.9 % (ref 11.5–15.5)
WBC: 6.7 10*3/uL (ref 4.0–10.5)

## 2017-08-25 LAB — CSF CULTURE

## 2017-08-25 MED ORDER — POTASSIUM CHLORIDE CRYS ER 20 MEQ PO TBCR
40.0000 meq | EXTENDED_RELEASE_TABLET | Freq: Every day | ORAL | Status: DC
  Administered 2017-08-25 – 2017-08-26 (×2): 40 meq via ORAL
  Filled 2017-08-25 (×2): qty 2

## 2017-08-25 MED ORDER — CHLORHEXIDINE GLUCONATE CLOTH 2 % EX PADS
6.0000 | MEDICATED_PAD | Freq: Every day | CUTANEOUS | Status: DC
  Administered 2017-08-25 – 2017-08-26 (×2): 6 via TOPICAL

## 2017-08-25 MED ORDER — FUROSEMIDE 10 MG/ML IJ SOLN
40.0000 mg | Freq: Once | INTRAMUSCULAR | Status: AC
  Administered 2017-08-25: 40 mg via INTRAVENOUS
  Filled 2017-08-25: qty 4

## 2017-08-25 MED ORDER — SPIRONOLACTONE 12.5 MG HALF TABLET
12.5000 mg | ORAL_TABLET | Freq: Every day | ORAL | Status: DC
  Administered 2017-08-25 – 2017-08-26 (×2): 12.5 mg via ORAL
  Filled 2017-08-25 (×3): qty 1

## 2017-08-25 MED ORDER — CHLORHEXIDINE GLUCONATE CLOTH 2 % EX PADS
6.0000 | MEDICATED_PAD | Freq: Every day | CUTANEOUS | Status: DC
  Administered 2017-08-24: 6 via TOPICAL

## 2017-08-25 NOTE — Progress Notes (Signed)
PROGRESS NOTE    Angelica Kelley  ZHY:[id number] DOB: 11/15/1945 DOA: 08/19/2017 PCP: Unk Pinto, MD      Brief Narrative:  Angelica Kelley is a 72 y.o. female with a history of bipolar disorder, TIA, prediabetes, HTN, HLD and suspected recent SDH who presented to the ED with AMS, found on the ground by her niece with widespread bruising, grossly altered from baseline with dried blood noted from the mouth. On evaluation she has been afebrile, hypoxic and encephalopathic. CT head revealed interval clearing of chronic SDH, no acute findings on atrophic background and C-spine showed no fracture. CXR unremarkable, urinalysis suggestive of UTI. Hgb down from 12.4 in March to 9.7 on arrival and has continued to trend downward to 8.2 with negative FOBT, INR 1.1, no features of hemolysis. Creatinine up from 1 to 1.8 with CK elevated to 6,984. Due to ongoing agitation, ativan was given and soft mittens placed though LP is unobtainable. MRI and EEG are ordered and empiric coverage for meningitis/encephalitis started per ID phone recommendations. GI is consulted.      Assessment & Plan:  Acute encephalopathy Suspect benzo overdose superimposed on progressing dementia.  Bacterial and HSV meningoencephalitis have been ruled out.  Nothing focal to suggest stroke.  Symptoms have mostly resolved.   -Delirium precautions, avoid benzodiazepines -Continue trazodone -Taper clonazepam     New onset atrial fibrillation CHADS2Vasc would be 4 (history of TIA, age, gender).  Patient is not a candidate for anticoagulation at present primarily due to her inability to care for self at home and unwillingness to allow supervision (an ongong issue, APS involved).  If this were to change, considerations may change in the future, but not now.  Furthermore, it should be noted she had a suspected SDH over the winter.  Converted back to sinus overnight with correction of K.  Telemetry reviewed, no more atrial  fibrillation. -Supplement potassium -Consult to cardiology, appreciate cares  Elevated troponin Appreciate cardiology input, ischemic work up deferred.  Acute on chronic diastolic CHF Echo shows normal LVEF, grade 2 diastolic dysfunction, elevated filling pressures, no RWMA. -Continue Spironolactone - Lasix IV twice daily -monitor K -Strict I's and O's, daily weights  Hypokalemia Mag normal -Continue K  Anemia No signs of blood in stool.  FOBT negative.  GI bleeding low likelihood.  Unclear etiology of anemia. -Hold PPI  Rhabdomyolysis Nontraumatic, found down.  Resolving.    Bipolar disorder -Continue lamotrigine, sertraline -Decrease bupropion while tapering BZDs  Dementia -PT/OT consult       DVT prophylaxis: SCDs Code Status: FULL Family Communication: None present MDM and disposition Plan:  The below labs and imaging reports were reviewed and summarized above.  Medications were adjusted will continue to the above.  The patient was admitted with acute altered mental status, extensive number of considered diagnoses.  Suspect that this was a benzodiazepine toxicity, now resolved.  She also has new onset atrial fibrillation for which anticoagulate she is being deferred.  The context of new A. fib, she has had congestive heart failure, for which we are continuing to diurese her.  Likely home tomorrow.       Consultants:   GI  Cardiology  Procedures:   None  Antimicrobials:   Vanc 4/8 >> 4/11  Ceftriaxone 4/8 >> 4/11  Ampicillin 4/8 >> 4/11  Acyclovir  4/8 >> 4/13   Subjective: More awake today, oriented, no headache, chest pain, abdominal pain, nausea, diaphoresis, fever.  She has some dyspnea, worse with exertion.  Objective: Vitals:   08/25/17 0300 08/25/17 0400 08/25/17 0900 08/25/17 1320  BP:  119/64    Pulse:  77    Resp:  18    Temp: 98 F (36.7 C)  98.3 F (36.8 C) 98 F (36.7 C)  TempSrc: Oral     SpO2:  96%    Weight:       Height:        Intake/Output Summary (Last 24 hours) at 08/25/2017 1527 Last data filed at 08/25/2017 1300 Gross per 24 hour  Intake 370 ml  Output 500 ml  Net -130 ml   Filed Weights   08/19/17 1750  Weight: 59 kg (130 lb)    Examination: General appearance:   Female, lying in bed, interactive, no acute distress. HEENT: Conjunctiva normal, lids and lashes normal, oropharynx moist, no oral lesions.  Lips and teeth normal.  Hearing normal Skin: Skin is warm and dry, there are numerous skin lacerations, bruises and ecchymoses on the head, arms.  No swelling, redness, or induration. Cardiac: Tachycardic, regular, no murmurs.  1+ lower extremity edema.Marland Kitchen Respiratory: Breathing appears labored at rest, respiratory effort appears increased.  She has no rales or wheezes. Abdomen: Abdomen soft, no tenderness to palpation, no hepatosplenomegaly. MSK: No deformities or effusions of the large joints of the upper or lower extremities bilaterally Neuro: Pupils equal round and reactive to light, cranial nerves normal and symmetric.  Strength is equal and 5/5 in upper and lower extremities bilaterally.   Psych: Sensorium is intact and reactive.  She is oriented to person place disoriented to year.  Judgment and insight seem somewhat impaired by dementia.  Attention normal.   Data Reviewed: I have personally reviewed following labs and imaging studies:  CBC: Recent Labs  Lab 08/19/17 1807  08/21/17 0344 08/22/17 0749 08/23/17 0500 08/24/17 0520 08/25/17 0500  WBC 14.3*   < > 13.3* 10.7* 10.6* 8.6 6.7  NEUTROABS 12.7*  --   --   --   --   --   --   HGB 9.7*   < > 9.4* 9.1* 9.8* 9.3* 8.9*  HCT 30.6*   < > 28.8* 28.0* 30.1* 29.5* 28.1*  MCV 87.9   < > 89.2 89.5 89.1 88.6 90.6  PLT 192   < > 213 199 238 223 207   < > = values in this interval not displayed.   Basic Metabolic Panel: Recent Labs  Lab 08/19/17 1837  08/21/17 0344 08/22/17 0100 08/22/17 1413 08/23/17 0200 08/23/17 0500  08/24/17 0520 08/25/17 0500  NA  --    < > 146* 144  --   --  146* 142 143  K  --    < > 2.5* 2.7* 2.7* 3.0* 3.1* 3.1* 3.5  CL  --    < > 111 110  --   --  110 108 111  CO2  --    < > 19* 21*  --   --  22 25 24   GLUCOSE  --    < > 99 118*  --   --  100* 103* 90  BUN  --    < > 22* 19  --   --  15 15 16   CREATININE  --    < > 0.97 0.85  --   --  0.74 0.64 0.69  CALCIUM  --    < > 8.5* 8.4*  --   --  8.6* 8.5* 8.2*  MG 2.7*  --  2.4 2.3  --   --   --   --   --    < > =  values in this interval not displayed.   GFR: Estimated Creatinine Clearance: 53.3 mL/min (by C-G formula based on SCr of 0.69 mg/dL). Liver Function Tests: Recent Labs  Lab 08/19/17 1807  AST 121*  ALT 43  ALKPHOS 61  BILITOT 1.0  PROT 6.0*  ALBUMIN 3.6   No results for input(s): LIPASE, AMYLASE in the last 168 hours. Recent Labs  Lab 08/19/17 2128  AMMONIA 16   Coagulation Profile: Recent Labs  Lab 08/19/17 1807  INR 1.10   Cardiac Enzymes: Recent Labs  Lab 08/19/17 1807 08/19/17 2128 08/20/17 0357 08/20/17 1200 08/21/17 0344 08/22/17 0754 08/23/17 0200 08/23/17 0622 08/23/17 1222  CKTOTAL 5,453*  --  6,984*  --  3,967* 1,535*  --   --   --   TROPONINI  --  0.05*  --  0.05*  --   --  1.14* 0.70* 0.58*   BNP (last 3 results) No results for input(s): PROBNP in the last 8760 hours. HbA1C: No results for input(s): HGBA1C in the last 72 hours. CBG: Recent Labs  Lab 08/24/17 2014 08/24/17 2304 08/25/17 0311 08/25/17 0812 08/25/17 1148  GLUCAP 90 89 86 80 99   Lipid Profile: No results for input(s): CHOL, HDL, LDLCALC, TRIG, CHOLHDL, LDLDIRECT in the last 72 hours. Thyroid Function Tests: No results for input(s): TSH, T4TOTAL, FREET4, T3FREE, THYROIDAB in the last 72 hours. Anemia Panel: No results for input(s): VITAMINB12, FOLATE, FERRITIN, TIBC, IRON, RETICCTPCT in the last 72 hours. Urine analysis:    Component Value Date/Time   COLORURINE AMBER (A) 08/19/2017 1835    APPEARANCEUR CLOUDY (A) 08/19/2017 1835   LABSPEC 1.023 08/19/2017 1835   PHURINE 5.0 08/19/2017 1835   GLUCOSEU NEGATIVE 08/19/2017 1835   HGBUR LARGE (A) 08/19/2017 1835   BILIRUBINUR NEGATIVE 08/19/2017 [place] 08/19/2017 1835   PROTEINUR 30 (A) 08/19/2017 1835   UROBILINOGEN 1 06/09/2014 1531   NITRITE NEGATIVE 08/19/2017 [place] 08/19/2017 1835   Sepsis Labs: @LABRCNTIP (procalcitonin:4,lacticacidven:4)  ) Recent Results (from the past 240 hour(s))  Blood Culture (routine x 2)     Status: None   Collection Time: 08/19/17  6:00 PM  Result Value Ref Range Status   Specimen Description BLOOD RIGHT ANTECUBITAL  Final   Special Requests   Final    BOTTLES DRAWN AEROBIC AND ANAEROBIC Blood Culture adequate volume   Culture   Final    NO GROWTH 5 DAYS Performed at [facility], [place] [address] [place], [place] [address]    Report Status 08/24/2017 FINAL  Final  Blood Culture (routine x 2)     Status: None   Collection Time: 08/19/17  6:26 PM  Result Value Ref Range Status   Specimen Description BLOOD LEFT ANTECUBITAL  Final   Special Requests   Final    BOTTLES DRAWN AEROBIC AND ANAEROBIC Blood Culture adequate volume   Culture   Final    NO GROWTH 5 DAYS Performed at [facility], [place] [address], [place], [place] [address]    Report Status 08/24/2017 FINAL  Final  Urine culture     Status: None   Collection Time: 08/19/17  6:35 PM  Result Value Ref Range Status   Specimen Description URINE, CATHETERIZED  Final   Special Requests NONE  Final   Culture   Final    NO GROWTH Performed at [facility], [place] [address], [place], [place] [address]    Report Status 08/20/2017 FINAL  Final  MRSA PCR Screening     Status: None   Collection Time: 08/20/17  6:01 PM  Result Value Ref Range Status   MRSA by PCR NEGATIVE NEGATIVE Final    Comment:        The GeneXpert MRSA Assay (FDA approved for NASAL specimens only), is  one component of a comprehensive MRSA colonization surveillance program. It is not intended to diagnose MRSA infection nor to guide or monitor treatment for MRSA infections. Performed at [facility], [place] [address], [place], [place] [address]   CSF culture     Status: None   Collection Time: 08/22/17  1:45 PM  Result Value Ref Range Status   Specimen Description CSF  Final   Special Requests NONE  Final   Gram Stain   Final    WBC PRESENT, PREDOMINANTLY MONONUCLEAR NO ORGANISMS SEEN CYTOSPIN SMEAR    Culture   Final    NO GROWTH 3 DAYS Performed at [facility], [place] [address], [place], [place] [address]    Report Status 08/25/2017 FINAL  Final         Radiology Studies: No results found.      Scheduled Meds: . buPROPion  450 mg Oral Daily  . Chlorhexidine Gluconate Cloth  6 each Topical Q0600  . clonazePAM  0.25 mg Oral QHS  . lamoTRIgine  400 mg Oral Daily  . potassium chloride  40 mEq Oral Daily  . sertraline  200 mg Oral Daily  . sodium chloride flush  10-40 mL Intracatheter Q12H  . spironolactone  12.5 mg Oral Daily  . thiamine  100 mg Oral Daily  . traZODone  50 mg Oral QHS   Continuous Infusions:    LOS: 6 days    Time spent: 5 minutes    Edwin Dada, MD [facility] 08/25/2017, 8:40 AM    Pager [phone] --- please page though AMION:  [email or web address] Password [email or web address] If 7PM-7AM, please contact night-coverage

## 2017-08-25 NOTE — Progress Notes (Signed)
1900: Handoff report received from RN. Pt resting in bed, pleasantly confused. Discussed plan of care for the shift; pt amenable to plan.  0000: Pt resting without s/s of discomfort.  0400: Pt continues resting without s/s of discomfort.  0700: Handoff report given to RN. No acute events overnight.

## 2017-08-25 NOTE — Social Work (Addendum)
CSW continuing to follow for discharge support when medically appropriate. CSW spoke with pt brother Nadara Mustard, pt brother looked over list with niece, requested CSW call pt niece [name].   CSW spoke with pt niece [name], she has looked over list and ranks [employer] first, followed by [employer] and then [employer]. CSW will reach out to [facility] and check bed availability.   CSW paged MD, pt will be able to get insurance authorization on Monday.   [place], [place] Work [phone]

## 2017-08-26 LAB — GLUCOSE, CAPILLARY
GLUCOSE-CAPILLARY: 88 mg/dL (ref 65–99)
GLUCOSE-CAPILLARY: 92 mg/dL (ref 65–99)
GLUCOSE-CAPILLARY: 169 mg/dL — AB (ref 65–99)
Glucose-Capillary: 104 mg/dL — ABNORMAL HIGH (ref 65–99)
Glucose-Capillary: 92 mg/dL (ref 65–99)
Glucose-Capillary: 94 mg/dL (ref 65–99)

## 2017-08-26 LAB — CBC
HEMATOCRIT: 29.3 % — AB (ref 36.0–46.0)
Hemoglobin: 9.3 g/dL — ABNORMAL LOW (ref 12.0–15.0)
MCH: 28.7 pg (ref 26.0–34.0)
MCHC: 31.7 g/dL (ref 30.0–36.0)
MCV: 90.4 fL (ref 78.0–100.0)
Platelets: 236 10*3/uL (ref 150–400)
RBC: 3.24 MIL/uL — ABNORMAL LOW (ref 3.87–5.11)
RDW: 14.6 % (ref 11.5–15.5)
WBC: 7.2 10*3/uL (ref 4.0–10.5)

## 2017-08-26 LAB — BASIC METABOLIC PANEL
ANION GAP: 11 (ref 5–15)
BUN: 12 mg/dL (ref 6–20)
CALCIUM: 8.4 mg/dL — AB (ref 8.9–10.3)
CO2: 29 mmol/L (ref 22–32)
Chloride: 103 mmol/L (ref 101–111)
Creatinine, Ser: 0.77 mg/dL (ref 0.44–1.00)
Glucose, Bld: 93 mg/dL (ref 65–99)
Potassium: 3.6 mmol/L (ref 3.5–5.1)
Sodium: 143 mmol/L (ref 135–145)

## 2017-08-26 MED ORDER — SODIUM CHLORIDE 0.9 % IV BOLUS
500.0000 mL | Freq: Once | INTRAVENOUS | Status: AC
  Administered 2017-08-26: 500 mL via INTRAVENOUS

## 2017-08-26 MED ORDER — CLONAZEPAM 0.125 MG PO TBDP
0.1250 mg | ORAL_TABLET | Freq: Every day | ORAL | Status: DC
  Administered 2017-08-26: 0.125 mg via ORAL
  Filled 2017-08-26: qty 1

## 2017-08-26 MED ORDER — BUPROPION HCL ER (XL) 150 MG PO TB24
300.0000 mg | ORAL_TABLET | Freq: Every day | ORAL | Status: DC
  Administered 2017-08-26 – 2017-08-27 (×2): 300 mg via ORAL
  Filled 2017-08-26 (×2): qty 2

## 2017-08-26 MED ORDER — FUROSEMIDE 10 MG/ML IJ SOLN
40.0000 mg | Freq: Once | INTRAMUSCULAR | Status: AC
  Administered 2017-08-26: 40 mg via INTRAVENOUS
  Filled 2017-08-26: qty 4

## 2017-08-26 NOTE — Progress Notes (Signed)
OT Cancellation    08/26/17 1600  OT Visit Information  Last OT Received On 08/26/17  Reason Eval/Treat Not Completed Medical issues which prohibited therapy. RN requesting to hold therapy. BP soft and MD notified. (BP 98/48 and MAP 61). Will return as schedule allows. Thank you.   McGraw, OTR/L Acute Rehab Pager: [phone] Office: [phone]

## 2017-08-26 NOTE — Progress Notes (Signed)
PROGRESS NOTE    Angelica Kelley  FBP:[id number] DOB: 1945-11-11 DOA: 08/19/2017 PCP: Unk Pinto, MD      Brief Narrative:  Angelica Kelley is a 72 y.o. female with a history of bipolar disorder, TIA, prediabetes, HTN, HLD and suspected recent SDH who presented to the ED with AMS, found on the ground by her niece with widespread bruising, grossly altered from baseline with dried blood noted from the mouth. On evaluation she has been afebrile, hypoxic and encephalopathic. CT head revealed interval clearing of chronic SDH, no acute findings on atrophic background and C-spine showed no fracture. CXR unremarkable, urinalysis suggestive of UTI. Hgb down from 12.4 in March to 9.7 on arrival and has continued to trend downward to 8.2 with negative FOBT, INR 1.1, no features of hemolysis. Creatinine up from 1 to 1.8 with CK elevated to 6,984. Due to ongoing agitation, ativan was given and soft mittens placed though LP is unobtainable. MRI and EEG are ordered and empiric coverage for meningitis/encephalitis started per ID phone recommendations. GI is consulted.      Assessment & Plan:  Acute encephalopathy Dementia Suspect benzo overdose superimposed on progressing dementia.  Bacterial and HSV meningoencephalitis have been ruled out.  Nothing focal to suggest stroke.  Symptoms resolved -Delirium precautions -Avoid alprazolam -Plan for slow outpatient taper of clonazepam   New onset atrial fibrillation CHADS2Vasc would be 4 (history of TIA, age, gender).  Patient is not a candidate for anticoagulation at present primarily due to her inability to care for self at home and unwillingness to allow supervision (an ongong issue, APS involved). Furthermore, it should be noted she had a suspected SDH over the winter.  Converted back to sinus with correction of K. -Consult cardiology -Patient warrants anticoagulation.  However at the moment her long-term living situation is unclear, and she is not safe to  manage her own anticoagulant at home.  If family are able to persuade her to enter assisted living, would recommend at that point initiating Eliquis  Elevated troponin Appreciate cardiology input, ischemic work up deferred.  Acute on chronic diastolic CHF Echo shows normal LVEF, grade 2 diastolic dysfunction, elevated filling pressures, no RWMA.  Still seems to have pulmonary edema. -Continue spironolactone - Continue IV Lasix -Strict I's and O's, daily weights -monitor potassium  Hypokalemia Mag normal -Continue K  Anemia No signs of blood in stool.  FOBT negative.  GI bleeding low likelihood.  Unclear etiology of anemia.  Rhabdomyolysis Nontraumatic, found down.  Resolving.    Bipolar disorder -Continue lamotrigine, sertraline, lower dose bupropion        DVT prophylaxis: SCDs Code Status: FULL Family Communication: Daughter by phone MDM and disposition Plan: The below labs and imaging reports were reviewed and summarized above.  Medications were adjusted or continued as above.  Patient was admitted with acute altered mental status, likely from benzodiazepine toxicity, now resolved.  She also has new onset atrial fibrillation for which anticoagulation is being deferred, and also new onset congestive heart failure.  Today she is still appears congested, so we will continue Lasix.  Likely to SNF tomorrow   Consultants:   GI  Cardiology  Procedures:   None  Antimicrobials:   Vanc 4/8 >> 4/11  Ceftriaxone 4/8 >> 4/11  Ampicillin 4/8 >> 4/11  Acyclovir  4/8 >> 4/13   Subjective: Still with some congestion, dyspnea on exertion.  No headache, chest pain, abdominal pain, nausea, fever.  No leg swelling.  No orthopnea.  Objective: Vitals:  08/26/17 1438 08/26/17 1547 08/26/17 1556 08/26/17 1600  BP: (!) 94/46 (!) 76/34 (!) 81/38 (!) 98/48  Pulse:  79 79 79  Resp: 14  11 12   Temp:      TempSrc:      SpO2:  100% 100%   Weight:      Height:         Intake/Output Summary (Last 24 hours) at 08/26/2017 1623 Last data filed at 08/26/2017 1407 Gross per 24 hour  Intake 480 ml  Output 1500 ml  Net -1020 ml   Filed Weights   08/19/17 1750  Weight: 59 kg (130 lb)    Examination: General appearance:   Elderly female, sitting up in a recliner, interactive, no acute distress, appears tired. HEENT: Conjunctive are normal, lids and lashes normal, oropharynx moist, no oral lesions, lips and teeth normal, hearing normal. Skin: Skin is warm and dry, there are numerous skin lacerations, bruises and ecchymoses on the head, arms.  No swelling, redness, or induration. Cardiac: Heart rate regular, no murmurs, no lower extremity edema. Respiratory: Respiratory rate easy and unlabored, rales at bases. Abdomen: Abdomen soft, no tenderness to palpation, no hepatosplenomegaly MSK: No deformities or effusions of the large joints of the upper or lower extremities bilaterally Neuro: Pupils equal round and reactive to light, cranial nerves normal and symmetric.  Strength is equal and 5/5 in upper and lower extremities bilaterally.   Psych: Sensorium intact and responsive to questions.  Oriented to person, place.  Judgment and insight appear somewhat impaired by dementia.    Data Reviewed: I have personally reviewed following labs and imaging studies:  CBC: Recent Labs  Lab 08/19/17 1807  08/22/17 0749 08/23/17 0500 08/24/17 0520 08/25/17 0500 08/26/17 0343  WBC 14.3*   < > 10.7* 10.6* 8.6 6.7 7.2  NEUTROABS 12.7*  --   --   --   --   --   --   HGB 9.7*   < > 9.1* 9.8* 9.3* 8.9* 9.3*  HCT 30.6*   < > 28.0* 30.1* 29.5* 28.1* 29.3*  MCV 87.9   < > 89.5 89.1 88.6 90.6 90.4  PLT 192   < > 199 238 223 207 236   < > = values in this interval not displayed.   Basic Metabolic Panel: Recent Labs  Lab 08/19/17 1837  08/21/17 0344 08/22/17 0100  08/23/17 0200 08/23/17 0500 08/24/17 0520 08/25/17 0500 08/26/17 0343  NA  --    < > 146* 144  --    --  146* 142 143 143  K  --    < > 2.5* 2.7*   < > 3.0* 3.1* 3.1* 3.5 3.6  CL  --    < > 111 110  --   --  110 108 111 103  CO2  --    < > 19* 21*  --   --  22 25 24 29   GLUCOSE  --    < > 99 118*  --   --  100* 103* 90 93  BUN  --    < > 22* 19  --   --  15 15 16 12   CREATININE  --    < > 0.97 0.85  --   --  0.74 0.64 0.69 0.77  CALCIUM  --    < > 8.5* 8.4*  --   --  8.6* 8.5* 8.2* 8.4*  MG 2.7*  --  2.4 2.3  --   --   --   --   --   --    < > =  values in this interval not displayed.   GFR: Estimated Creatinine Clearance: 53.3 mL/min (by C-G formula based on SCr of 0.77 mg/dL). Liver Function Tests: Recent Labs  Lab 08/19/17 1807  AST 121*  ALT 43  ALKPHOS 61  BILITOT 1.0  PROT 6.0*  ALBUMIN 3.6   No results for input(s): LIPASE, AMYLASE in the last 168 hours. Recent Labs  Lab 08/19/17 2128  AMMONIA 16   Coagulation Profile: Recent Labs  Lab 08/19/17 1807  INR 1.10   Cardiac Enzymes: Recent Labs  Lab 08/19/17 1807 08/19/17 2128 08/20/17 0357 08/20/17 1200 08/21/17 0344 08/22/17 0754 08/23/17 0200 08/23/17 0622 08/23/17 1222  CKTOTAL 5,453*  --  6,984*  --  3,967* 1,535*  --   --   --   TROPONINI  --  0.05*  --  0.05*  --   --  1.14* 0.70* 0.58*   BNP (last 3 results) No results for input(s): PROBNP in the last 8760 hours. HbA1C: No results for input(s): HGBA1C in the last 72 hours. CBG: Recent Labs  Lab 08/25/17 1913 08/25/17 2255 08/26/17 0334 08/26/17 0821 08/26/17 1150  GLUCAP 117* 97 88 92 169*   Lipid Profile: No results for input(s): CHOL, HDL, LDLCALC, TRIG, CHOLHDL, LDLDIRECT in the last 72 hours. Thyroid Function Tests: No results for input(s): TSH, T4TOTAL, FREET4, T3FREE, THYROIDAB in the last 72 hours. Anemia Panel: No results for input(s): VITAMINB12, FOLATE, FERRITIN, TIBC, IRON, RETICCTPCT in the last 72 hours. Urine analysis:    Component Value Date/Time   COLORURINE AMBER (A) 08/19/2017 1835   APPEARANCEUR CLOUDY (A)  08/19/2017 1835   LABSPEC 1.023 08/19/2017 1835   PHURINE 5.0 08/19/2017 1835   GLUCOSEU NEGATIVE 08/19/2017 1835   HGBUR LARGE (A) 08/19/2017 1835   BILIRUBINUR NEGATIVE 08/19/2017 [place] 08/19/2017 1835   PROTEINUR 30 (A) 08/19/2017 1835   UROBILINOGEN 1 06/09/2014 1531   NITRITE NEGATIVE 08/19/2017 [place] 08/19/2017 1835   Sepsis Labs: @LABRCNTIP (procalcitonin:4,lacticacidven:4)  ) Recent Results (from the past 240 hour(s))  Blood Culture (routine x 2)     Status: None   Collection Time: 08/19/17  6:00 PM  Result Value Ref Range Status   Specimen Description BLOOD RIGHT ANTECUBITAL  Final   Special Requests   Final    BOTTLES DRAWN AEROBIC AND ANAEROBIC Blood Culture adequate volume   Culture   Final    NO GROWTH 5 DAYS Performed at [facility], [place] [address], [place], [place] [address]    Report Status 08/24/2017 FINAL  Final  Blood Culture (routine x 2)     Status: None   Collection Time: 08/19/17  6:26 PM  Result Value Ref Range Status   Specimen Description BLOOD LEFT ANTECUBITAL  Final   Special Requests   Final    BOTTLES DRAWN AEROBIC AND ANAEROBIC Blood Culture adequate volume   Culture   Final    NO GROWTH 5 DAYS Performed at [facility], [place] [address], [place], [place] [address]    Report Status 08/24/2017 FINAL  Final  Urine culture     Status: None   Collection Time: 08/19/17  6:35 PM  Result Value Ref Range Status   Specimen Description URINE, CATHETERIZED  Final   Special Requests NONE  Final   Culture   Final    NO GROWTH Performed at [facility], [place] [address] [place], [place] [address]    Report Status 08/20/2017 FINAL  Final  MRSA PCR Screening     Status: None   Collection Time: 08/20/17  6:01 PM  Result Value Ref Range Status   MRSA by PCR NEGATIVE NEGATIVE Final    Comment:        The GeneXpert MRSA Assay (FDA approved for NASAL specimens only), is one component of  a comprehensive MRSA colonization surveillance program. It is not intended to diagnose MRSA infection nor to guide or monitor treatment for MRSA infections. Performed at [facility], [place] [address], [place], [place] [address]   CSF culture     Status: None   Collection Time: 08/22/17  1:45 PM  Result Value Ref Range Status   Specimen Description CSF  Final   Special Requests NONE  Final   Gram Stain   Final    WBC PRESENT, PREDOMINANTLY MONONUCLEAR NO ORGANISMS SEEN CYTOSPIN SMEAR    Culture   Final    NO GROWTH 3 DAYS Performed at [facility], [place] [address], [place],  [address]    Report Status 08/25/2017 FINAL  Final         Radiology Studies: No results found.      Scheduled Meds: . buPROPion  300 mg Oral Daily  . Chlorhexidine Gluconate Cloth  6 each Topical Q0600  . clonazePAM  0.25 mg Oral QHS  . lamoTRIgine  400 mg Oral Daily  . potassium chloride  40 mEq Oral Daily  . sertraline  200 mg Oral Daily  . sodium chloride flush  10-40 mL Intracatheter Q12H  . spironolactone  12.5 mg Oral Daily  . thiamine  100 mg Oral Daily  . traZODone  50 mg Oral QHS   Continuous Infusions: . sodium chloride    . sodium chloride       LOS: 7 days    Time spent: 25 minutes    Edwin Dada, MD [facility] 08/26/2017, 9:15 AM    Pager [phone] --- please page though AMION:  [email or web address] Password [email or web address] If 7PM-7AM, please contact night-coverage

## 2017-08-26 NOTE — Progress Notes (Signed)
Progress Note  Patient Name: Angelica Kelley Date of Encounter: 08/26/2017  Primary Cardiologist:   Mertie Moores, MD   Subjective   No chest pain.  No SOB.   Inpatient Medications    Scheduled Meds: . buPROPion  300 mg Oral Daily  . Chlorhexidine Gluconate Cloth  6 each Topical Q0600  . clonazePAM  0.25 mg Oral QHS  . furosemide  40 mg Intravenous Once  . lamoTRIgine  400 mg Oral Daily  . potassium chloride  40 mEq Oral Daily  . sertraline  200 mg Oral Daily  . sodium chloride flush  10-40 mL Intracatheter Q12H  . spironolactone  12.5 mg Oral Daily  . thiamine  100 mg Oral Daily  . traZODone  50 mg Oral QHS   Continuous Infusions:  PRN Meds: acetaminophen **OR** acetaminophen (TYLENOL) oral liquid 160 mg/5 mL **OR** acetaminophen, albuterol, sodium chloride flush   Vital Signs    Vitals:   08/26/17 0000 08/26/17 0400 08/26/17 0730 08/26/17 0800  BP: (!) 107/56 (!) 102/57  115/65  Pulse:    84  Resp: 16 19  (!) 24  Temp: 98.3 F (36.8 C) 98.2 F (36.8 C) (!) 97.5 F (36.4 C) (!) 97.5 F (36.4 C)  TempSrc: Oral Oral  Oral  SpO2:    94%  Weight:      Height:        Intake/Output Summary (Last 24 hours) at 08/26/2017 1119 Last data filed at 08/26/2017 0900 Gross per 24 hour  Intake 600 ml  Output 2000 ml  Net -1400 ml   Filed Weights   08/19/17 1750  Weight: 130 lb (59 kg)    Telemetry    NSR - Personally Reviewed  ECG    NA - Personally Reviewed  Physical Exam   GEN: No acute distress.   Neck: No  JVD Cardiac: RRR, no murmurs, rubs, or gallops.  Respiratory: Clear  to auscultation bilaterally. GI: Soft, nontender, non-distended  MS: No  edema; No deformity. Neuro:  Nonfocal  Psych: Normal affect, decreased memory  Labs    Chemistry Recent Labs  Lab 08/19/17 1807  08/24/17 0520 08/25/17 0500 08/26/17 0343  NA 142   < > 142 143 143  K 2.8*   < > 3.1* 3.5 3.6  CL 102   < > 108 111 103  CO2 23   < > 25 24 29   GLUCOSE 121*   < >  103* 90 93  BUN 51*   < > 15 16 12   CREATININE 1.85*   < > 0.64 0.69 0.77  CALCIUM 9.3   < > 8.5* 8.2* 8.4*  PROT 6.0*  --   --   --   --   ALBUMIN 3.6  --   --   --   --   AST 121*  --   --   --   --   ALT 43  --   --   --   --   ALKPHOS 61  --   --   --   --   BILITOT 1.0  --   --   --   --   GFRNONAA 26*   < > >60 >60 >60  GFRAA 30*   < > >60 >60 >60  ANIONGAP 17*   < > 9 8 11    < > = values in this interval not displayed.     Hematology Recent Labs  Lab 08/24/17 0520 08/25/17 0500 08/26/17 1191  WBC 8.6 6.7 7.2  RBC 3.33* 3.10* 3.24*  HGB 9.3* 8.9* 9.3*  HCT 29.5* 28.1* 29.3*  MCV 88.6 90.6 90.4  MCH 27.9 28.7 28.7  MCHC 31.5 31.7 31.7  RDW 14.5 14.9 14.6  PLT 223 207 236    Cardiac Enzymes Recent Labs  Lab 08/20/17 1200 08/23/17 0200 08/23/17 0622 08/23/17 1222  TROPONINI 0.05* 1.14* 0.70* 0.58*    Recent Labs  Lab 08/19/17 1808  TROPIPOC 0.02     BNPNo results for input(s): BNP, PROBNP in the last 168 hours.   DDimer No results for input(s): DDIMER in the last 168 hours.   Radiology    No results found.  Cardiac Studies   ECHO  08/20/17  Study Conclusions  - Left ventricle: The cavity size was normal. Wall thickness was   increased in a pattern of mild LVH. Systolic function was normal.   The estimated ejection fraction was in the range of 60% to 65%.   Doppler parameters are consistent with pseudonormal left   ventricular relaxation (grade 2 diastolic dysfunction). The E/e&'   ratio is >15, suggesting elevated LV filling pressure. - Aortic valve: Trileaflet; mildly calcified leaflets. Mild   stenosis. Trivial regurgitation. Mean gradient (S): 16 mm Hg.   Peak gradient (S): 34 mm Hg. Valve area (VTI): 1.75 cm^2. Valve   area (Vmax): 1.43 cm^2. Valve area (Vmean): 1.54 cm^2. - Mitral valve: Mildly thickened leaflets . There was mild   regurgitation. - Left atrium: Severely dilated. The atrium was normal in size. - Right ventricle: The  cavity size was mildly dilated. Systolic   function was normal. - Right atrium: Moderately dilated. - Tricuspid valve: There was moderate regurgitation. - Pulmonary arteries: PA peak pressure: 46 mm Hg (S). - Inferior vena cava: The vessel was normal in size. The   respirophasic diameter changes were in the normal range (>= 50%),   consistent with normal central venous pressure.  Patient Profile     72 y.o. female  with a hx of HTN, HLD, SEM, GERD, bipolar, TIA, pre-DM, ?SDH seen on CT 07/01/2017, who is being seen for the evaluation of elevated troponin, new onset atrial fib at the request of Dr Eudelia Bunch.   Assessment & Plan    ACUTE DIASTOLIC HF:  She seems to be euvlomic.  Seems to be euvolemic.  No change in therapy.  ELEVATED TROPONIN:    We will follow up as an outpatient and plan an ischemia evaluation.  We will schedule the follow up.    PAF:  Not on anticoagulation secondary to falls and fall risk.  No further atrial fib on telemetry.       For questions or updates, please contact [place] Please consult [email or web address] for contact info under Cardiology/STEMI.   Signed, Minus Breeding, MD  08/26/2017, 11:19 AM

## 2017-08-26 NOTE — Progress Notes (Addendum)
Reported to Physician Loleta Books) about the increase in patient blood pressure but also an output of 800 ml in between time. Another 564ml bolus was ordered.  Rechecked blood pressure (check flow sheet), changed out bp cuff for pediatric size and rechecked with an increase...98/48 (61). Pt is pale in color but does not complain of feeling lightheaded, dizzy, or faint. Patient lungs are clearer than initial shift assessment with slight expiratory wheezes.  Nurse paged Dr. Loleta Books again to alert. Patient transferred from chair to bed.

## 2017-08-26 NOTE — Social Work (Addendum)
CSW spoke with pt and pt niece at bedside, pt niece toured [facility] and would prefer that SNF because it is close to her home. Pt amenable to plan.   [facility] has started [facility], CSW will page MD.   1:12pm- [facility] will not have a discharge today. Family has elected to go to [facility] instead. [facility] has started [facility].  5:40pm- CSW has received confirmation that [facility] has received insurance authorization for pt. Able to take tomorrow pending medical stability.   [place] Mt, [place] Work [phone]

## 2017-08-26 NOTE — Progress Notes (Signed)
Physical Therapy Treatment Patient Details Name: Angelica Kelley MRN: [id number] DOB: 05-26-1945 Today's Date: 08/26/2017    History of Present Illness Pt is a 72 y/o female admitted after being found down in home and also with acute encephalopathy. CT of head showed interval clearing of L subdural hematoma and CT of C spine negative for acute abnormality. PMH includes bipolar disorder, TIA, pre-diabetes, and HTN.     PT Comments    Pt progressing towards functional mobility goals. Bed mobility min guard with increased time, transfers and ambulation min assist with RW for steadying. Pt performed sit<>stand 10x from various surfaces min assist. Ambulation limited this session due to need to use restroom. Pt following commands consistently throughout session with mod verbal cues and redirection at times. Current plan remains appropriate. Will continue to follow acutely and progress as tolerated.    Follow Up Recommendations  SNF;Supervision/Assistance - 24 hour     Equipment Recommendations  None recommended by PT    Recommendations for Other Services       Precautions / Restrictions Precautions Precautions: Fall Restrictions Weight Bearing Restrictions: No    Mobility  Bed Mobility Overal bed mobility: Needs Assistance Bed Mobility: Supine to Sit     Supine to sit: HOB elevated;Min guard     General bed mobility comments: min guard for safety with HOB elevated to 20 deg. increased effort and time required to elevate trunk. pt able to bridge to reposition in bed with cues for technique  Transfers Overall transfer level: Needs assistance Equipment used: Rolling walker (2 wheeled) Transfers: Sit to/from Stand Sit to Stand: Min assist         General transfer comment: min assist for steadyig of pt and RW. increased effort and time required to rise. VCs for hand placement. sit<>stand from EOB at lowest level x5, sit<>stand from commode x5 for  pericare  Ambulation/Gait Ambulation/Gait assistance: Min assist Ambulation Distance (Feet): 15 Feet Assistive device: Rolling walker (2 wheeled) Gait Pattern/deviations: Step-through pattern Gait velocity: decreased   General Gait Details: pt requiring min assist for steadying and management of RW. pt running into doorway and bed and requirin gcues to maintian LEs withing BOS of RW. mild shuffling pattern noted. SpO2 >90% throughout session    Stairs             Wheelchair Mobility    Modified Rankin (Stroke Patients Only)       Balance Overall balance assessment: Needs assistance Sitting-balance support: Feet supported Sitting balance-Leahy Scale: Fair     Standing balance support: Bilateral upper extremity supported;During functional activity;No upper extremity supported Standing balance-Leahy Scale: Poor Standing balance comment: pt requiring min assist to balance for static standing at sink during hand washing, requires bil UE on RW and external support for balance during ambulation                            Cognition Arousal/Alertness: Awake/alert Behavior During Therapy: Flat affect;Anxious Overall Cognitive Status: Impaired/Different from baseline Area of Impairment: Orientation;Attention;Following commands;Safety/judgement;Awareness;Problem solving;Memory                 Orientation Level: Disoriented to;Time Current Attention Level: Sustained Memory: Decreased recall of precautions Following Commands: Follows one step commands consistently;Follows multi-step commands with increased time Safety/Judgement: Decreased awareness of safety;Decreased awareness of deficits Awareness: Emergent Problem Solving: Difficulty sequencing;Requires verbal cues;Requires tactile cues General Comments: pt perseverating on using the bathroom throughout the session  Exercises Other Exercises Other Exercises: sit<>stand x10    General Comments  General comments (skin integrity, edema, etc.): SpO2 >90% on RA throughout session      Pertinent Vitals/Pain Pain Assessment: No/denies pain Pain Intervention(s): Monitored during session    Home Living                      Prior Function            PT Goals (current goals can now be found in the care plan section) Acute Rehab PT Goals Patient Stated Goal: to go home  PT Goal Formulation: Patient unable to participate in goal setting Time For Goal Achievement: 09/03/17 Potential to Achieve Goals: Fair Progress towards PT goals: Progressing toward goals    Frequency    Min 2X/week      PT Plan Current plan remains appropriate    Co-evaluation              AM-PAC PT "6 Clicks" Daily Activity  Outcome Measure  Difficulty turning over in bed (including adjusting bedclothes, sheets and blankets)?: A Little Difficulty moving from lying on back to sitting on the side of the bed? : A Little Difficulty sitting down on and standing up from a chair with arms (e.g., wheelchair, bedside commode, etc,.)?: A Little Help needed moving to and from a bed to chair (including a wheelchair)?: A Little Help needed walking in hospital room?: A Little Help needed climbing 3-5 steps with a railing? : A Lot 6 Click Score: 17    End of Session Equipment Utilized During Treatment: Gait belt Activity Tolerance: Patient tolerated treatment well Patient left: in bed;with call bell/phone within reach;with bed alarm set Nurse Communication: Mobility status(pt had BM) PT Visit Diagnosis: History of falling (Z91.81);Muscle weakness (generalized) (M62.81);Unsteadiness on feet (R26.81)     Time: 3832-9191 PT Time Calculation (min) (ACUTE ONLY): 19 min  Charges:  $Therapeutic Activity: 8-22 mins                    G Codes:       Vic Ripper, SPT   Vic Ripper 08/26/2017, 10:05 AM

## 2017-08-26 NOTE — Discharge Summary (Signed)
Physician Discharge Summary  Angelica Kelley WNU:[id number] DOB: 1945-08-12 DOA: 08/19/2017  PCP: Unk Pinto, MD  Admit date: 08/19/2017 Discharge date: 08/27/2017  Admitted From: Home  Disposition:  SNF   Recommendations for Outpatient Follow-up:  1. Follow up with PCP in 1-2 weeks 2. Please obtain BMP and CBC in one week to follow up K and anemia 3. Taper clonazepam to 0.125 mg at night in 1 month, then stop 4. Further reduce Bupropion and replace with alternative anti-depressant   Home Health: N/A  Equipment/Devices: Per SNF  Discharge Condition: Good  CODE STATUS: FULL Diet recommendation: Cardiac  Brief/Interim Summary: Angelica Colello Finchis a72 y.o.femalewith a history of bipolar disorder, TIA, prediabetes, HTN, HLD and suspected recent SDH who presented to the ED with AMS, found on the ground by her niece with widespread bruising, grossly altered from baseline with difficulty breathing.   On evaluation she has been afebrile, hypoxic and encephalopathic.CT headrevealed interval clearing of her SDH (this had been noted also as an incidental finding on CT recently), andC-spine showed no fracture. CXR unremarkable. Hgb down from 12.4 in March to 9.7 on arrival and has continued to trend downward to 8.2 with negative FOBT, INR 1.1, no features of hemolysis. Creatinine up from 1 to 1.8 with CK elevated to 6,984. Due to ongoing agitation, ativan was given and soft mittens placed though LP is unobtainable. MRI and EEG are ordered and empiric coverage for meningitis/encephalitis started per ID phone recommendations. GI were consulted due to an initial concern for blood around her mouth.       Discharge Diagnoses:   Acute encephalopathy Suspect benzo overdose superimposed on progressing dementia.  Bacterial and HSV meningoencephalitis were ruled out by LP without WBCs and negative HSV PCR.  LP showed increased RBCs, consistent with resolving known SDH.  Nothing focal to suggest  stroke.  Symptoms mostly resolved to her baseline mild cognitive impairment.  Xanax was stopped and replaced with clonazepam 0.25 nightly, with plans to slowly taper and stop over 1-2 months to avoid benzodiazepines in this at risk patient.  Her lamictal and gabapentin were continued.     New onset atrial fibrillation This was a new finding after admission.  It's contribution to her presentation is not clear, but likely contributed.  It was precipitated largely by low potassium (2.7 mmol/L on admission).   -CHADS2Vasc would be 4 (history of TIA, age, gender).   -Patient is not a candidate for anticoagulation at present primarily due to her inability to care for self at home and unwillingness to allow supervision (an ongong issue, APS has been involved).   -If this were to change (I.e. She were to enter assisted living or have live in aide), considerations may change, and anticoagulation would be recommended.   -Her home bisoprolol was held and she maintained sinus rhythm after correction of hypokalemia.   Elevated troponin Low flat elevation of troponin.  Cardiology consulted, no ischemic work up recommended.  Likely demand ischemia in setting of A fib.  Acute on chronic diastolic CHF Developed pulmonary edema and hypoxia with A fib.  Echo shows normal LVEF, grade 2 diastolic dysfunction, elevated filling pressures, no RWMA.  Started on spironolactone, and Lasix.  Both d/c'd on discharge due to lower blood pressures, orthostasis.  -Weigh daily and restart Lasix for weight gain >5 lbs or leg swelling -Repeat BMP in 1 week  Hypokalemia Magnesium normal.  K low and difficult to increase.  HCTZ stopped. K supplement continued.  Repeat K in  1 week.   Anemia Evaluated by GI.  No signs of blood in stool.  FOBT negative.  GI bleeding low likelihood.  Unclear etiology of anemia.  Rhabdomyolysis Nontraumatic, found down.  Resolved.  Bipolar disorder Her lamotrigine, gabapentin, and  sertraline were continued.  Her bupropion was lowered in setting of tapering benzodiazepine, given higher risk of seizure with this medicine.  Would plan to taper further and replace with alternative, Abilify, mirtazapine.         Discharge Instructions  Discharge Instructions    Diet - low sodium heart healthy   Complete by:  As directed    Discharge instructions   Complete by:  As directed    From Dr. Loleta Books: You were admitted for confusion and trouble breathing. It appears that this was a combination of Xanax use and a new abnormal heart rhythm called "Atrial fibrillation" or "A fib"  and severely low potassium. The A fib also appears to have caused a congestive heart failure episode.  To treat the Afib, we corrected your low potassium. To treat the heart failure episode, we treated the A fib, used diuretics (or "water pills") to get extra fluid off and adjusted your heart failure medicines.  For the Xanax overdose, we have stopped this medicine, and replaced it with a more long acting medicine, that can be tapered off, called clonazepam.     For the A fib: Monitor your blood pressure daily for the next two weeks If your blood pressure comes above 120/80, restart bisoprolol 5 mg daily. Also, you should be on a blood thinner to prevent strokes associated with A fib.  However, I think that your ability to manage your own medicines is not good enough, and not safe for you to take a bloodthinner unless you are in a more supportive environment, like living in Assisted Living, or having someone live in the home with you and manage your medicines. If you decide to move into assisted living, talk with your doctors about starting an anticoagulant for A fib.  If you start a blood thinner, stop aspirin.  For your heart failure episode: This was probably caused by the A fib alone, and should not occur again.  For the low potassium: Take potassium 10 mEq daily and have your potassium  checked in 1 week  For the Xanax overdose: Do not take Xanax any more. Take clonazepam 0.25 mg nightly for now, but taper this in a month to 0.125 mg nightly, then after another month stop it.  Continue your gabapentin, sertraline and Lamictal.   You may need to increase the gabapentin dose up if tapering clonazepam causes worsening of your anxiety. Lower your dose of bupropion for now, because high doses can be associated with seizures.  Consider an alternative medicine, like mirtazapine or Abilify.   Increase activity slowly   Complete by:  As directed      Allergies as of 08/27/2017      Reactions   Lipitor [atorvastatin]    Fatigue   Prednisone Other (See Comments)   Change in mental status      Medication List    STOP taking these medications   ALPRAZolam 1 MG tablet Commonly known as:  XANAX   bisoprolol-hydrochlorothiazide 5-6.25 MG tablet Commonly known as:  ZIAC   hydrochlorothiazide 12.5 MG capsule Commonly known as:  MICROZIDE     TAKE these medications   aspirin 325 MG EC tablet Take 1 tablet (325 mg total) by mouth daily.  buPROPion 300 MG 24 hr tablet Commonly known as:  WELLBUTRIN XL Take 1 tablet (300 mg total) by mouth daily. What changed:    medication strength  how much to take   clonazePAM 0.25 MG disintegrating tablet Commonly known as:  KLONOPIN Take 1 tablet (0.25 mg total) by mouth at bedtime.   gabapentin 300 MG capsule Commonly known as:  NEURONTIN Take 300 mg by mouth at bedtime.   lamoTRIgine 200 MG tablet Commonly known as:  LAMICTAL Take 400 mg by mouth daily.   potassium chloride 10 MEQ tablet Commonly known as:  K-DUR Take 1 tablet (10 mEq total) by mouth daily.   rosuvastatin 40 MG tablet Commonly known as:  CRESTOR take 1/2 to 1 tablet by mouth once daily What changed:  See the new instructions.   sertraline 100 MG tablet Commonly known as:  ZOLOFT Take 200 mg by mouth daily.   vitamin B-1 250 MG tablet Take 250  mg by mouth 2 (two) times daily.   Vitamin D3 5000 units Caps Take 5,000 Units by mouth daily. Only take 5 days  week      Contact information for after-discharge care    Destination    [facility] .   Service:  Skilled Nursing Contact information: [place] [phone]             Allergies  Allergen Reactions  . Lipitor [Atorvastatin]     Fatigue  . Prednisone Other (See Comments)    Change in mental status    Consultations:  Cardiology   Procedures/Studies: Ct Abdomen Pelvis Wo Contrast  Result Date: 08/20/2017 CLINICAL DATA:  Anemia.  Recent fall. EXAM: CT ABDOMEN AND PELVIS WITHOUT CONTRAST TECHNIQUE: Multidetector CT imaging of the abdomen and pelvis was performed following the standard protocol without IV contrast. COMPARISON:  Lumbar spine MRI 09/29/2013. FINDINGS: Lower chest: Patchy ground-glass opacities in both lung bases with interlobular septal thickening and small pleural effusions. Decreased attenuation of the blood pool compatible with anemia. Coronary artery atherosclerosis. Hepatobiliary: No focal liver abnormality is seen. There is moderate distention of the gallbladder without gross wall thickening or acute inflammatory changes. There is no biliary dilatation. Pancreas: Unremarkable. Spleen: Unremarkable. Adrenals/Urinary Tract: Mild bilateral adrenal gland thickening. Punctate right and possibly left renal calculi. No hydronephrosis. Unremarkable bladder. Stomach/Bowel: The stomach is within normal limits. A moderately large amount of stool is present in the colon and rectum. There is no evidence of bowel obstruction. Vascular/Lymphatic: Abdominal aortic atherosclerosis without aneurysm. No enlarged lymph nodes. Reproductive: Status post hysterectomy. No adnexal masses. Other: No intraperitoneal free fluid.  No retroperitoneal hematoma. Musculoskeletal: Multiple old left rib fractures. Chronic L3  and L4 compression fractures. Bilateral L4 pars defects with 9 mm anterolisthesis of L4 on L5. 6 mm anterolisthesis of L3 on L4. IMPRESSION: 1. No acute abnormality identified in the abdomen or pelvis. 2. Ground-glass opacities in both lung bases. Potential etiologies include acute causes such as edema, infection, and pulmonary hemorrhage as well as chronic processes including interstitial lung disease. 3. Small pleural effusions. 4. Nonobstructing nephrolithiasis. 5. Moderately large amount of stool in the colon and rectum. No bowel obstruction. Electronically Signed   By: Logan Bores M.D.   On: 08/20/2017 17:54   Ct Head Wo Contrast  Result Date: 08/19/2017 CLINICAL DATA:  72 year old female found on floor by family. Last seen 3 days ago. Initial encounter. EXAM: CT HEAD WITHOUT CONTRAST CT CERVICAL SPINE WITHOUT CONTRAST  TECHNIQUE: Multidetector CT imaging of the head and cervical spine was performed following the standard protocol without intravenous contrast. Multiplanar CT image reconstructions of the cervical spine were also generated. COMPARISON:  07/02/2017 MR. 07/01/2016 CT head and cervical spine FINDINGS: CT HEAD FINDINGS Brain: Exam is motion degraded. Resolution of previously noted left-sided chronic subdural hematoma. No new intracranial hemorrhage noted. Chronic microvascular changes without CT evidence of large acute infarct. Global atrophy. No intracranial mass lesion noted on this unenhanced exam. Vascular: Vascular calcifications Skull: No skull fracture Sinuses/Orbits: No acute orbital abnormality. Visualized paranasal sinuses are clear. Mastoid air cells and middle ear cavities are clear. Temporomandibular joint degenerative changes. Other: Left parietal scalp hematoma. CT CERVICAL SPINE FINDINGS Alignment: Curvature cervical spine convex left. Skull base and vertebrae: No cervical spine fracture noted. Soft tissues and spinal canal: No abnormal prevertebral soft tissue swelling. Disc  levels: Multilevel cervical spondylotic changes most prominent C5-6 level. Upper chest: Scarring lung apices. Other: Bilateral carotid bifurcation calcifications. IMPRESSION: Exams are motion degraded. Interval clearing of previously noted left-sided chronic subdural hematoma. No new intracranial hemorrhage noted. Chronic microvascular changes without CT evidence of large acute infarct. Atrophy. No cervical spine fracture detected. Curvature cervical spine convex left. No abnormal prevertebral soft tissue swelling. Cervical spondylotic changes similar to prior exam. Electronically Signed   By: Genia Del M.D.   On: 08/19/2017 19:47   Ct Cervical Spine Wo Contrast  Result Date: 08/19/2017 CLINICAL DATA:  72 year old female found on floor by family. Last seen 3 days ago. Initial encounter. EXAM: CT HEAD WITHOUT CONTRAST CT CERVICAL SPINE WITHOUT CONTRAST TECHNIQUE: Multidetector CT imaging of the head and cervical spine was performed following the standard protocol without intravenous contrast. Multiplanar CT image reconstructions of the cervical spine were also generated. COMPARISON:  07/02/2017 MR. 07/01/2016 CT head and cervical spine FINDINGS: CT HEAD FINDINGS Brain: Exam is motion degraded. Resolution of previously noted left-sided chronic subdural hematoma. No new intracranial hemorrhage noted. Chronic microvascular changes without CT evidence of large acute infarct. Global atrophy. No intracranial mass lesion noted on this unenhanced exam. Vascular: Vascular calcifications Skull: No skull fracture Sinuses/Orbits: No acute orbital abnormality. Visualized paranasal sinuses are clear. Mastoid air cells and middle ear cavities are clear. Temporomandibular joint degenerative changes. Other: Left parietal scalp hematoma. CT CERVICAL SPINE FINDINGS Alignment: Curvature cervical spine convex left. Skull base and vertebrae: No cervical spine fracture noted. Soft tissues and spinal canal: No abnormal prevertebral  soft tissue swelling. Disc levels: Multilevel cervical spondylotic changes most prominent C5-6 level. Upper chest: Scarring lung apices. Other: Bilateral carotid bifurcation calcifications. IMPRESSION: Exams are motion degraded. Interval clearing of previously noted left-sided chronic subdural hematoma. No new intracranial hemorrhage noted. Chronic microvascular changes without CT evidence of large acute infarct. Atrophy. No cervical spine fracture detected. Curvature cervical spine convex left. No abnormal prevertebral soft tissue swelling. Cervical spondylotic changes similar to prior exam. Electronically Signed   By: Genia Del M.D.   On: 08/19/2017 19:47   Dg Chest Port 1 View  Result Date: 08/22/2017 CLINICAL DATA:  Wheezing and tachypnea. EXAM: PORTABLE CHEST 1 VIEW COMPARISON:  Radiograph 08/19/2017 FINDINGS: Right upper extremity PICC with tip at the atrial caval junction. Unchanged cardiomegaly and mediastinal contours. Again seen aortic atherosclerosis. Increased interstitial thickening from prior exam with possible Kerley B-lines. Scattered atelectasis at the left lung base and right mid lung. No confluent consolidation. No large pleural effusion, left costophrenic angle excluded from the field of view. No pneumothorax. IMPRESSION: 1. Unchanged cardiomegaly  and aortic atherosclerosis. Suggestion of new mild pulmonary edema superimposed on chronic interstitial thickening. 2. Scattered atelectasis. Electronically Signed   By: Jeb Levering M.D.   On: 08/22/2017 04:14   Dg Chest Port 1 View  Result Date: 08/19/2017 CLINICAL DATA:  Found down today. EXAM: PORTABLE CHEST 1 VIEW COMPARISON:  Chest radiograph July 01, 2017 FINDINGS: Cardiac silhouette is mildly enlarged and unchanged. Calcified aortic knob. Chronic interstitial changes without pleural effusion or focal consolidation. No pneumothorax. Stable apical pleural thickening. Osteopenia. Severe LEFT shoulder osteoarthrosis, incompletely  imaged. IMPRESSION: Stable cardiomegaly and chronic interstitial changes. Aortic Atherosclerosis (ICD10-I70.0). Electronically Signed   By: Elon Alas M.D.   On: 08/19/2017 18:26   [place] Ekg Site Rite  Result Date: 08/21/2017 If Site Rite image not attached, placement could not be confirmed due to current cardiac rhythm.  Dg Fluoro Guide Lumbar Puncture  Result Date: 08/22/2017 CLINICAL DATA:  Encephalopathy, possible meningitis EXAM: DIAGNOSTIC LUMBAR PUNCTURE UNDER FLUOROSCOPIC GUIDANCE FLUOROSCOPY TIME:  Fluoroscopy Time:  0 minutes, 30 seconds Radiation Exposure Index (if provided by the fluoroscopic device): 3.4 mGy Number of Acquired Spot Images: 0 PROCEDURE: I discussed the risks (including hemorrhage, infection, headache, and nerve damage, among others), benefits, and alternatives to fluoroscopically guided lumbar puncture with the patient's niece by telephone. We specifically discussed the high technical likelihood of success of the procedure. The patient's niece understood understood and elected for the patient to undergo the procedure. Telephone consent policies were followed. Standard time-out was employed. Following sterile skin prep and local anesthetic administration consisting of 1 percent lidocaine, a 22 gauge spinal needle was advanced without difficulty into the thecal sac at the at the L2-3 level. Clear CSF was returned. Opening pressure was not obtained. 12 cc of clear CSF was collected. The needle was subsequently removed and the skin cleansed and bandaged. No immediate complications were observed. IMPRESSION: 1. Fluoroscopically guided lumbar puncture yielding 12 cc of clear CSF. No complications were observed. Electronically Signed   By: Van Clines M.D.   On: 08/22/2017 14:20  Echocardiogram  Study Conclusions  - Left ventricle: The cavity size was normal. Wall thickness was   increased in a pattern of mild LVH. Systolic function was normal.   The estimated ejection  fraction was in the range of 60% to 65%.   Doppler parameters are consistent with pseudonormal left   ventricular relaxation (grade 2 diastolic dysfunction). The E/e&'   ratio is >15, suggesting elevated LV filling pressure. - Aortic valve: Trileaflet; mildly calcified leaflets. Mild   stenosis. Trivial regurgitation. Mean gradient (S): 16 mm Hg.   Peak gradient (S): 34 mm Hg. Valve area (VTI): 1.75 cm^2. Valve   area (Vmax): 1.43 cm^2. Valve area (Vmean): 1.54 cm^2. - Mitral valve: Mildly thickened leaflets . There was mild   regurgitation. - Left atrium: Severely dilated. The atrium was normal in size. - Right ventricle: The cavity size was mildly dilated. Systolic   function was normal. - Right atrium: Moderately dilated. - Tricuspid valve: There was moderate regurgitation. - Pulmonary arteries: PA peak pressure: 46 mm Hg (S). - Inferior vena cava: The vessel was normal in size. The   respirophasic diameter changes were in the normal range (>= 50%),   consistent with normal central venous pressure.  Impressions:  - Compared to a prior study in 2018, there are no signficant   changes. LV filling pressure is notably elevated.    Subjective: Feels tired, "off" mentally, but no pain in headache, chest pain,  abdominal pain.  No nausea.  Oriented.  No dyspnea.  No orthopnea, or leg sewlling.  No vomiting, diarrhea.  Discharge Exam: Vitals:   08/27/17 0600 08/27/17 0700  BP: (!) 107/50 (!) 96/45  Pulse: 69   Resp: 15 12  Temp:    SpO2: 97%    Vitals:   08/27/17 0300 08/27/17 0400 08/27/17 0600 08/27/17 0700  BP:  (!) 106/46 (!) 107/50 (!) 96/45  Pulse:  61 69   Resp:  19 15 12   Temp: (!) 97.5 F (36.4 C)     TempSrc: Oral     SpO2:  98% 97%   Weight:      Height:        General: Pt is lying in bed, alert, awake, not in acute distress, appears tired Cardiovascular: RRR, S1/S2 +, soft mrumur, no rubs, no gallops, no LE edema Respiratory: CTA bilaterally, no wheezing,  no rhonchi Abdominal: Soft, NT, ND, bowel sounds + Extremities: no edema, no cyanosis    The results of significant diagnostics from this hospitalization (including imaging, microbiology, ancillary and laboratory) are listed below for reference.     Microbiology: Recent Results (from the past 240 hour(s))  Blood Culture (routine x 2)     Status: None   Collection Time: 08/19/17  6:00 PM  Result Value Ref Range Status   Specimen Description BLOOD RIGHT ANTECUBITAL  Final   Special Requests   Final    BOTTLES DRAWN AEROBIC AND ANAEROBIC Blood Culture adequate volume   Culture   Final    NO GROWTH 5 DAYS Performed at [facility], [address], [place], [place] [address]    Report Status 08/24/2017 FINAL  Final  Blood Culture (routine x 2)     Status: None   Collection Time: 08/19/17  6:26 PM  Result Value Ref Range Status   Specimen Description BLOOD LEFT ANTECUBITAL  Final   Special Requests   Final    BOTTLES DRAWN AEROBIC AND ANAEROBIC Blood Culture adequate volume   Culture   Final    NO GROWTH 5 DAYS Performed at [facility], [place] [address] [place], [place] [address]    Report Status 08/24/2017 FINAL  Final  Urine culture     Status: None   Collection Time: 08/19/17  6:35 PM  Result Value Ref Range Status   Specimen Description URINE, CATHETERIZED  Final   Special Requests NONE  Final   Culture   Final    NO GROWTH Performed at [facility], [place] [address], [place], [place] [address]    Report Status 08/20/2017 FINAL  Final  MRSA PCR Screening     Status: None   Collection Time: 08/20/17  6:01 PM  Result Value Ref Range Status   MRSA by PCR NEGATIVE NEGATIVE Final    Comment:        The GeneXpert MRSA Assay (FDA approved for NASAL specimens only), is one component of a comprehensive MRSA colonization surveillance program. It is not intended to diagnose MRSA infection nor to guide or monitor treatment for MRSA infections. Performed at  [facility], [place] [address], [place], [place] [address]   CSF culture     Status: None   Collection Time: 08/22/17  1:45 PM  Result Value Ref Range Status   Specimen Description CSF  Final   Special Requests NONE  Final   Gram Stain   Final    WBC PRESENT, PREDOMINANTLY MONONUCLEAR NO  ORGANISMS SEEN CYTOSPIN SMEAR    Culture   Final    NO GROWTH 3 DAYS Performed at [facility], [place] [address] [place], [place] [address]    Report Status 08/25/2017 FINAL  Final     Labs: BNP (last 3 results) No results for input(s): BNP in the last 8760 hours. Basic Metabolic Panel: Recent Labs  Lab 08/21/17 0344 08/22/17 0100  08/23/17 0500 08/24/17 0520 08/25/17 0500 08/26/17 0343 08/27/17 0330  NA 146* 144  --  146* 142 143 143 141  K 2.5* 2.7*   < > 3.1* 3.1* 3.5 3.6 4.4  CL 111 110  --  110 108 111 103 103  CO2 19* 21*  --  22 25 24 29 26   GLUCOSE 99 118*  --  100* 103* 90 93 83  BUN 22* 19  --  15 15 16 12 19   CREATININE 0.97 0.85  --  0.74 0.64 0.69 0.77 0.89  CALCIUM 8.5* 8.4*  --  8.6* 8.5* 8.2* 8.4* 8.4*  MG 2.4 2.3  --   --   --   --   --   --    < > = values in this interval not displayed.   Liver Function Tests: No results for input(s): AST, ALT, ALKPHOS, BILITOT, PROT, ALBUMIN in the last 168 hours. No results for input(s): LIPASE, AMYLASE in the last 168 hours. No results for input(s): AMMONIA in the last 168 hours. CBC: Recent Labs  Lab 08/22/17 0749 08/23/17 0500 08/24/17 0520 08/25/17 0500 08/26/17 0343  WBC 10.7* 10.6* 8.6 6.7 7.2  HGB 9.1* 9.8* 9.3* 8.9* 9.3*  HCT 28.0* 30.1* 29.5* 28.1* 29.3*  MCV 89.5 89.1 88.6 90.6 90.4  PLT 199 238 223 207 236   Cardiac Enzymes: Recent Labs  Lab 08/20/17 1200 08/21/17 0344 08/22/17 0754 08/23/17 0200 08/23/17 0622 08/23/17 1222  CKTOTAL  --  3,967* 1,535*  --   --   --   TROPONINI 0.05*  --   --  1.14* 0.70* 0.58*   BNP: Invalid input(s): POCBNP CBG: Recent Labs  Lab 08/26/17 1700  08/26/17 2106 08/26/17 2331 08/27/17 0317 08/27/17 0805  GLUCAP 104* 92 94 80 89   D-Dimer No results for input(s): DDIMER in the last 72 hours. Hgb A1c No results for input(s): HGBA1C in the last 72 hours. Lipid Profile No results for input(s): CHOL, HDL, LDLCALC, TRIG, CHOLHDL, LDLDIRECT in the last 72 hours. Thyroid function studies No results for input(s): TSH, T4TOTAL, T3FREE, THYROIDAB in the last 72 hours.  Invalid input(s): FREET3 Anemia work up No results for input(s): VITAMINB12, FOLATE, FERRITIN, TIBC, IRON, RETICCTPCT in the last 72 hours. Urinalysis    Component Value Date/Time   COLORURINE AMBER (A) 08/19/2017 1835   APPEARANCEUR CLOUDY (A) 08/19/2017 1835   LABSPEC 1.023 08/19/2017 1835   PHURINE 5.0 08/19/2017 1835   GLUCOSEU NEGATIVE 08/19/2017 1835   HGBUR LARGE (A) 08/19/2017 1835   BILIRUBINUR NEGATIVE 08/19/2017 [place] 08/19/2017 1835   PROTEINUR 30 (A) 08/19/2017 1835   UROBILINOGEN 1 06/09/2014 1531   NITRITE NEGATIVE 08/19/2017 1835   LEUKOCYTESUR NEGATIVE 08/19/2017 1835   [facility] Invalid input(s): PROCALCITONIN,  WBC,  LACTICIDVEN Microbiology Recent Results (from the past 240 hour(s))  Blood Culture (routine x 2)     Status: None   Collection Time: 08/19/17  6:00 PM  Result Value Ref Range Status   Specimen Description BLOOD RIGHT ANTECUBITAL  Final   Special Requests  Final    BOTTLES DRAWN AEROBIC AND ANAEROBIC Blood Culture adequate volume   Culture   Final    NO GROWTH 5 DAYS Performed at [facility], [place] [address], [place], [place] [address]    Report Status 08/24/2017 FINAL  Final  Blood Culture (routine x 2)     Status: None   Collection Time: 08/19/17  6:26 PM  Result Value Ref Range Status   Specimen Description BLOOD LEFT ANTECUBITAL  Final   Special Requests   Final    BOTTLES DRAWN AEROBIC AND ANAEROBIC Blood Culture adequate volume   Culture   Final    NO GROWTH 5 DAYS Performed at [facility], [place] [address], [place], [place] [address]    Report Status 08/24/2017 FINAL  Final  Urine culture     Status: None   Collection Time: 08/19/17  6:35 PM  Result Value Ref Range Status   Specimen Description URINE, CATHETERIZED  Final   Special Requests NONE  Final   Culture   Final    NO GROWTH Performed at [facility], [place] [address], [place], [place] [address]    Report Status 08/20/2017 FINAL  Final  MRSA PCR Screening     Status: None   Collection Time: 08/20/17  6:01 PM  Result Value Ref Range Status   MRSA by PCR NEGATIVE NEGATIVE Final    Comment:        The GeneXpert MRSA Assay (FDA approved for NASAL specimens only), is one component of a comprehensive MRSA colonization surveillance program. It is not intended to diagnose MRSA infection nor to guide or monitor treatment for MRSA infections. Performed at [facility], [place] [address] [place], [place] [address]   CSF culture     Status: None   Collection Time: 08/22/17  1:45 PM  Result Value Ref Range Status   Specimen Description CSF  Final   Special Requests NONE  Final   Gram Stain   Final    WBC PRESENT, PREDOMINANTLY MONONUCLEAR NO ORGANISMS SEEN CYTOSPIN SMEAR    Culture   Final    NO GROWTH 3 DAYS Performed at [facility], [place] [address] [place], [place] [address]    Report Status 08/25/2017 FINAL  Final     Time coordinating discharge: 35 minutes  SIGNED:   Edwin Dada, MD  [facility] 08/27/2017, 8:54 AM

## 2017-08-26 NOTE — Progress Notes (Signed)
During 1200 vitals pt blood pressure was low (check flow sheet), rechecked bp (sitting then standing).  Paged Dr. Loleta Books, bolus of 500 ml of normal saline order to be given over 61 minuites. Bolus complete  Blood pressure rechecked now 104/42.   Patient d/c to SNF delayed until 08/27/2017

## 2017-08-26 NOTE — Clinical Social Work Placement (Addendum)
   CLINICAL SOCIAL WORK PLACEMENT  NOTE [facility] RN to call [phone] with report  prior to discharge.  Date:  08/27/2017  Patient Details  Name: Angelica Kelley MRN: [id number] Date of Birth: [birth year]  Clinical Social Work is seeking post-discharge placement for this patient at the [place] level of care (*CSW will initial, date and re-position this form in  chart as items are completed):  Yes   Patient/family provided with [facility] Work Department's list of facilities offering this level of care within the geographic area requested by the patient (or if unable, by the patient's family).  Yes   Patient/family informed of their freedom to choose among providers that offer the needed level of care, that participate in Medicare, Medicaid or managed care program needed by the patient, have an available bed and are willing to accept the patient.  Yes   Patient/family informed of [facility]'s ownership interest in [facility] and [facility], as well as of the fact that they are under no obligation to receive care at these facilities.  PASRR submitted to EDS on       PASRR number received on 08/22/17     Existing PASRR number confirmed on       FL2 transmitted to all facilities in geographic area requested by pt/family on 08/21/17     FL2 transmitted to all facilities within larger geographic area on       Patient informed that his/her managed care company has contracts with or will negotiate with certain facilities, including the following:        Yes   Patient/family informed of bed offers received.  Patient chooses bed at [facility] and [facility] recommends and patient chooses bed at      Patient to be transferred to [facility] on 08/27/2017  Patient to be transferred to facility by [facility]     Patient family notified on 08/27/2017 of transfer.  Name of family member notified:   [name], neice      PHYSICIAN Please prepare priority discharge summary, including medications     Additional Comment:    _______________________________________________ Alexander Mt, LCSWA 08/26/2017, 11:19 AM

## 2017-08-26 NOTE — Progress Notes (Signed)
Per Dr. Loleta Books we will keep patient saline locked and push oral hydration.

## 2017-08-27 DIAGNOSIS — R2681 Unsteadiness on feet: Secondary | ICD-10-CM | POA: Diagnosis not present

## 2017-08-27 DIAGNOSIS — G300 Alzheimer's disease with early onset: Secondary | ICD-10-CM | POA: Diagnosis not present

## 2017-08-27 DIAGNOSIS — R488 Other symbolic dysfunctions: Secondary | ICD-10-CM | POA: Diagnosis not present

## 2017-08-27 DIAGNOSIS — T424X1D Poisoning by benzodiazepines, accidental (unintentional), subsequent encounter: Secondary | ICD-10-CM | POA: Diagnosis not present

## 2017-08-27 DIAGNOSIS — I5033 Acute on chronic diastolic (congestive) heart failure: Secondary | ICD-10-CM | POA: Diagnosis not present

## 2017-08-27 DIAGNOSIS — I10 Essential (primary) hypertension: Secondary | ICD-10-CM | POA: Diagnosis not present

## 2017-08-27 DIAGNOSIS — F319 Bipolar disorder, unspecified: Secondary | ICD-10-CM | POA: Diagnosis not present

## 2017-08-27 DIAGNOSIS — M6282 Rhabdomyolysis: Secondary | ICD-10-CM | POA: Diagnosis not present

## 2017-08-27 DIAGNOSIS — I480 Paroxysmal atrial fibrillation: Secondary | ICD-10-CM | POA: Diagnosis not present

## 2017-08-27 DIAGNOSIS — E876 Hypokalemia: Secondary | ICD-10-CM | POA: Diagnosis not present

## 2017-08-27 DIAGNOSIS — Z9181 History of falling: Secondary | ICD-10-CM | POA: Diagnosis not present

## 2017-08-27 DIAGNOSIS — T796XXA Traumatic ischemia of muscle, initial encounter: Secondary | ICD-10-CM | POA: Diagnosis not present

## 2017-08-27 DIAGNOSIS — G8911 Acute pain due to trauma: Secondary | ICD-10-CM | POA: Diagnosis not present

## 2017-08-27 DIAGNOSIS — G9340 Encephalopathy, unspecified: Secondary | ICD-10-CM | POA: Diagnosis not present

## 2017-08-27 DIAGNOSIS — N179 Acute kidney failure, unspecified: Secondary | ICD-10-CM | POA: Diagnosis not present

## 2017-08-27 DIAGNOSIS — D649 Anemia, unspecified: Secondary | ICD-10-CM | POA: Diagnosis not present

## 2017-08-27 DIAGNOSIS — I5031 Acute diastolic (congestive) heart failure: Secondary | ICD-10-CM | POA: Diagnosis not present

## 2017-08-27 DIAGNOSIS — M6281 Muscle weakness (generalized): Secondary | ICD-10-CM | POA: Diagnosis not present

## 2017-08-27 DIAGNOSIS — I4891 Unspecified atrial fibrillation: Secondary | ICD-10-CM | POA: Diagnosis not present

## 2017-08-27 LAB — BASIC METABOLIC PANEL WITH GFR
Anion gap: 12 (ref 5–15)
BUN: 19 mg/dL (ref 6–20)
CO2: 26 mmol/L (ref 22–32)
Calcium: 8.4 mg/dL — ABNORMAL LOW (ref 8.9–10.3)
Chloride: 103 mmol/L (ref 101–111)
Creatinine, Ser: 0.89 mg/dL (ref 0.44–1.00)
GFR calc Af Amer: >60 mL/min
GFR calc non Af Amer: >60 mL/min
Glucose, Bld: 83 mg/dL (ref 65–99)
Potassium: 4.4 mmol/L (ref 3.5–5.1)
Sodium: 141 mmol/L (ref 135–145)

## 2017-08-27 LAB — GLUCOSE, CAPILLARY
Glucose-Capillary: 80 mg/dL (ref 65–99)
Glucose-Capillary: 89 mg/dL (ref 65–99)
Glucose-Capillary: 109 mg/dL — ABNORMAL HIGH (ref 65–99)

## 2017-08-27 MED ORDER — BUPROPION HCL ER (XL) 300 MG PO TB24: 300.0000 mg | ORAL_TABLET | Freq: Every day | ORAL | 0 refills | Status: DC

## 2017-08-27 MED ORDER — POTASSIUM CHLORIDE ER 10 MEQ PO TBCR: 10.0000 meq | EXTENDED_RELEASE_TABLET | Freq: Every day | ORAL | 0 refills | Status: DC

## 2017-08-27 MED ORDER — CLONAZEPAM 0.25 MG PO TBDP: 0.2500 mg | ORAL_TABLET | Freq: Every day | ORAL | 0 refills | Status: DC

## 2017-08-27 NOTE — Progress Notes (Signed)
Called [facility] and gave report to [facility].  Patient discharge gone over.  Patient confused about where she is going, reminded her that her and her family chose [facility] as a rehabilitation facility.  Patient states that she has no further questions. Patient PICC line d/c and in the bed waiting for transport via [place] [facility].

## 2017-08-27 NOTE — Social Work (Signed)
Clinical Social Worker facilitated patient discharge including contacting patient family and facility to confirm patient discharge plans.  Clinical information faxed to facility and family agreeable with plan.  CSW arranged ambulance transport via [facility] to [facility] at 2:30pm.  RN to call [phone] with report  prior to discharge.  Clinical Social Worker will sign off for now as social work intervention is no longer needed. Please consult [place] again if new need arises.  Alexander Mt, [place] Social Worker [phone]

## 2017-08-27 NOTE — Care Management Note (Signed)
Case Management Note  Patient Details  Name: OLUWATONI ROTUNNO MRN: [id number] Date of Birth: 1945/07/16  Subjective/Objective:   Pt admitted on 08/19/17 with acute encephalopathy, acute renal failure, and rhabdomyolysis.  PTA, pt independent,  resided at home alone.                   Action/Plan: PT/OT recommending SNF for rehab at dc; CSW consulted to facilitate dc to SNF upon medical stability.   Expected Discharge Date:  08/27/17               Expected Discharge Plan:  Skilled Nursing Facility  In-House Referral:  Clinical Social Work  Discharge planning Services  CM Consult  Post Acute Care Choice:    Choice offered to:     DME Arranged:    DME Agency:     HH Arranged:    [place] Agency:     Status of Service:  Completed, signed off  If discussed at [employer], dates discussed:    Additional Comments:  08/27/17 J. Anmol Fleck, RN, BSN Pt medically stable for dc today.  Plan dc to SNF today, per CSW arrangements.    Reinaldo Raddle, RN, BSN  Trauma/Neuro ICU Case Manager [phone]

## 2017-08-27 NOTE — Progress Notes (Signed)
1900: Handoff report received from RN. Pt resting in bed, pleasantly confused. Discussed plan of care for the shift; pt amenable to plan.  0000: Pt resting comfortably.  0400: Pt continues resting comfortably. Poor po intake overnight, despite frequent offers and encouragement, however, BP remains stable.  0700: Handoff report given to RN. No acute events overnight.

## 2017-08-29 NOTE — Progress Notes (Addendum)
In response to coding query: the patient did not have a urinary tract infection during her hospitalization.  Addendum: In response to coding query, the patient does not have CKD.  She has basline Cr. 0.7-0.9, was admitted with creatinine doubled from baseline.  This was an acute kidney injury from dehydration, Afib, and CHF.

## 2017-09-02 ENCOUNTER — Encounter: Payer: Self-pay | Admitting: Internal Medicine

## 2017-09-02 ENCOUNTER — Non-Acute Institutional Stay (SKILLED_NURSING_FACILITY): Payer: Medicare Other | Admitting: Internal Medicine

## 2017-09-02 DIAGNOSIS — F02818 Dementia in other diseases classified elsewhere, unspecified severity, with other behavioral disturbance: Secondary | ICD-10-CM

## 2017-09-02 DIAGNOSIS — I5033 Acute on chronic diastolic (congestive) heart failure: Secondary | ICD-10-CM

## 2017-09-02 DIAGNOSIS — E782 Mixed hyperlipidemia: Secondary | ICD-10-CM

## 2017-09-02 DIAGNOSIS — F418 Other specified anxiety disorders: Secondary | ICD-10-CM | POA: Diagnosis not present

## 2017-09-02 DIAGNOSIS — T796XXD Traumatic ischemia of muscle, subsequent encounter: Secondary | ICD-10-CM

## 2017-09-02 DIAGNOSIS — F319 Bipolar disorder, unspecified: Secondary | ICD-10-CM | POA: Diagnosis not present

## 2017-09-02 DIAGNOSIS — G9340 Encephalopathy, unspecified: Secondary | ICD-10-CM | POA: Diagnosis not present

## 2017-09-02 DIAGNOSIS — D649 Anemia, unspecified: Secondary | ICD-10-CM

## 2017-09-02 DIAGNOSIS — G300 Alzheimer's disease with early onset: Secondary | ICD-10-CM

## 2017-09-02 DIAGNOSIS — I480 Paroxysmal atrial fibrillation: Secondary | ICD-10-CM

## 2017-09-02 DIAGNOSIS — F0281 Dementia in other diseases classified elsewhere with behavioral disturbance: Secondary | ICD-10-CM

## 2017-09-02 DIAGNOSIS — T424X1D Poisoning by benzodiazepines, accidental (unintentional), subsequent encounter: Secondary | ICD-10-CM

## 2017-09-02 DIAGNOSIS — E876 Hypokalemia: Secondary | ICD-10-CM | POA: Diagnosis not present

## 2017-09-02 LAB — BASIC METABOLIC PANEL
BUN: 22 — AB (ref 4–21)
Creatinine: 0.9 (ref 0.5–1.1)
Glucose: 127
Potassium: 4.9 (ref 3.4–5.3)
Sodium: 142 (ref 137–147)

## 2017-09-02 LAB — CBC AND DIFFERENTIAL
HCT: 32 — AB (ref 36–46)
HEMOGLOBIN: 10.4 — AB (ref 12.0–16.0)
PLATELETS: 266 (ref 150–399)
WBC: 6.9

## 2017-09-02 NOTE — Progress Notes (Signed)
: Provider:  Noah Delaine. Sheppard Coil, MD Location:  [place] Room Number: 606T Place of Service:  SNF (31)  PCP: Unk Pinto, MD Patient Care Team: Unk Pinto, MD as PCP - General (Internal Medicine) Nahser, Wonda Cheng, MD as PCP - Cardiology (Cardiology) Richmond Campbell, MD as Consulting Physician (Gastroenterology) Chucky May, MD as Consulting Physician (Psychiatry) Dorna Leitz, MD as Consulting Physician (Orthopedic Surgery)  Extended Emergency Contact Information Primary Emergency Contact: [name],[name] Address: [address]           [place], [place] [address] [place] Phone: [phone] Relation: Niece     Allergies: Lipitor [atorvastatin] and Prednisone  Chief Complaint  Patient presents with  . New Admit To SNF    Admit to Facility    HPI: Patient is 72 y.o. female with hypertension, hyperlipidemia, prediabetes, TIA, and bipolar disorder, who was found to be confused and on the floor of her apartment by her niece on the afternoon of presentation to the emergency department.  According to the knees 4 days ago patient was fine.  On presentation patient was bruised all over.  CT head and CT spine did not show anything acute except for a clearing of former subdural hematoma.Patient's hemoglobin had dropped by 3 g in the last patient's hemoglobin had dropped by 3 g over the prior 10 days.  Labs reveal rhabdomyolysis EKG show prolonged QT interval.  Patient was admitted to [facility] from 4/8[facility]16 she was felt to have acute encephalopathy from suspected benzodiazepine overdose.  Hospital course was complicated by rhabdomyolysis, new onset atrial fibrillation and acute on chronic diastolic congestive heart failure, all of which have been resolved.  Patient is admitted to skilled nursing facility for OT/PT.  While at skilled nursing facility patient will be followed for bipolar disorder treated with Lamictal, and  Neurontin, depression treated with Wellbutrin and sertraline, and hyperlipidemia treated with Crestor.  Past Medical History:  Diagnosis Date  . Alcoholism ([place])   . Anxiety   . Depression   . Elevated hemoglobin A1c   . Fracture of right wrist   . GERD (gastroesophageal reflux disease)   . Heart murmur   . Hyperlipidemia   . Hypertension   . IBS (irritable bowel syndrome)     Past Surgical History:  Procedure Laterality Date  . ABDOMINAL HYSTERECTOMY  1991  . APPENDECTOMY    . BREAST SURGERY Left 1989   [facility]  . CARPAL TUNNEL RELEASE Right 10/18/2014   Procedure: CARPAL TUNNEL RELEASE;  Surgeon: Dorna Leitz, MD;  Location: [place];  Service: Orthopedics;  Laterality: Right;  . [place]  . NECK SURGERY  Remote    Ruptured disc, per patient. Got infected.   . ORIF WRIST FRACTURE Right 10/18/2014   Procedure: OPEN REDUCTION INTERNAL FIXATION (ORIF) RIGHT WRIST FRACTURE;  Surgeon: Dorna Leitz, MD;  Location: [place];  Service: Orthopedics;  Laterality: Right;  . TONSILLECTOMY  1965  . TUBAL LIGATION      Allergies as of 09/02/2017      Reactions   Lipitor [atorvastatin]    Fatigue   Prednisone Other (See Comments)   Change in mental status      Medication List        Accurate as of 09/02/17  8:51 AM. Always use your most recent med list.          aspirin 325 MG EC tablet Take 1 tablet (325 mg total) by  mouth daily.   buPROPion 300 MG 24 hr tablet Commonly known as:  WELLBUTRIN XL Take 1 tablet (300 mg total) by mouth daily.   clonazePAM 0.25 MG disintegrating tablet Commonly known as:  KLONOPIN Take 1 tablet (0.25 mg total) by mouth at bedtime.   gabapentin 300 MG capsule Commonly known as:  NEURONTIN Take 300 mg by mouth at bedtime.   lamoTRIgine 200 MG tablet Commonly known as:  LAMICTAL Take 400 mg by mouth daily.   potassium chloride 10 MEQ tablet Commonly known as:  K-DUR Take 1 tablet (10 mEq total) by mouth daily.   rosuvastatin 40  MG tablet Commonly known as:  CRESTOR take 1/2 to 1 tablet by mouth once daily   sertraline 100 MG tablet Commonly known as:  ZOLOFT Take 200 mg by mouth daily.   vitamin B-1 250 MG tablet Take 250 mg by mouth 2 (two) times daily.   Vitamin D3 5000 units Caps Take 5,000 Units by mouth daily. Only take 5 days  week       No orders of the defined types were placed in this encounter.   Immunization History  Administered Date(s) Administered  . DT 07/27/2015  . Influenza Split 02/22/2017  . Influenza, High Dose Seasonal PF 01/28/2014, 02/22/2017  . Influenza-Unspecified 03/15/2011, 01/04/2015, 02/17/2016  . Pneumococcal Conjugate-13 06/09/2014, 07/12/2016  . Pneumococcal-Unspecified 05/14/2008  . Td 05/14/2005  . Zoster 01/23/2012  . Zoster Recombinat (Shingrix) 02/22/2017    Social History   Tobacco Use  . Smoking status: Former Smoker    Types: Cigarettes    Last attempt to quit: 05/14/2010    Years since quitting: 7.3  . Smokeless tobacco: Never Used  . Tobacco comment: second hand smoke exposure also  Substance Use Topics  . Alcohol use: Not Currently    Comment: Quit 2002, denies she ever abused it.    Family history is   Family History  Problem Relation Age of Onset  . Heart disease Mother   . Diabetes Mother   . Leukemia Father   . Heart disease Brother   . Cancer Brother   . Parkinson's disease Brother       Review of Systems  DATA OBTAINED: from patient, nurse GENERAL:  no fevers, fatigue, appetite changes SKIN: No itching, or rash EYES: No eye pain, redness, discharge EARS: No earache, tinnitus, change in hearing NOSE: No congestion, drainage or bleeding  MOUTH/THROAT: No mouth or tooth pain, No sore throat RESPIRATORY: No cough, wheezing, SOB CARDIAC: No chest pain, palpitations, lower extremity edema  GI: No abdominal pain, No N/V/D or constipation, No heartburn or reflux  GU: No dysuria, frequency or urgency, or incontinence    MUSCULOSKELETAL: No unrelieved bone/joint pain NEUROLOGIC: No headache, dizziness or focal weakness PSYCHIATRIC: No c/o anxiety or sadness   Vitals:   09/02/17 0839  BP: 132/66  Pulse: 68  Resp: 18  Temp: 97.8 F (36.6 C)  SpO2: 95%    SpO2 Readings from Last 1 Encounters:  09/02/17 95%   Body mass index is 21.39 kg/m.     Physical Exam  GENERAL APPEARANCE: Alert, conversant,  No acute distress.  SKIN: No diaphoresis rash HEAD: Normocephalic, atraumatic  EYES: Conjunctiva/lids clear. Pupils round, reactive. EOMs intact.  EARS: External exam WNL, canals clear. Hearing grossly normal.  NOSE: No deformity or discharge.  MOUTH/THROAT: Lips w/o lesions  RESPIRATORY: Breathing is even, unlabored. Lung sounds are clear   CARDIOVASCULAR: Heart RRR 4/6 systolic murmur, no rubs or gallops.  No peripheral edema.   GASTROINTESTINAL: Abdomen is soft, non-tender, not distended w/ normal bowel sounds. GENITOURINARY: Bladder non tender, not distended  MUSCULOSKELETAL: No abnormal joints or musculature NEUROLOGIC:  Cranial nerves 2-12 grossly intact. Moves all extremities  PSYCHIATRIC: Mood and affect appropriate to situation, no behavioral issues  Patient Active Problem List   Diagnosis Date Noted  . Acute encephalopathy 08/19/2017  . ARF (acute renal failure) ([place]) 08/19/2017  . Hypokalemia 08/19/2017  . Rhabdomyolysis 08/19/2017  . AKI (acute kidney injury) ([place])   . TIA (transient ischemic attack) 06/18/2017  . Depression with anxiety 05/09/2017  . Fall 05/09/2017  . Slurred speech 05/09/2017  . Acute renal failure superimposed on stage 2 chronic kidney disease ([place]) 05/09/2017  . Chronic kidney disease, stage 3 ([place]) 04/23/2017  . OAB (overactive bladder) 06/09/2014  . Vitamin D deficiency 10/27/2013  . Medication management 10/27/2013  . Hypertension   . Hyperlipidemia, mixed   . GERD (gastroesophageal reflux disease)   . Bipolar depression ([place])   . Alcoholism ([place])    . IBS (irritable bowel syndrome)   . Other abnormal glucose       Labs reviewed: Basic Metabolic Panel:    Component Value Date/Time   NA 141 08/27/2017 0330   K 4.4 08/27/2017 0330   CL 103 08/27/2017 0330   CO2 26 08/27/2017 0330   GLUCOSE 83 08/27/2017 0330   BUN 19 08/27/2017 0330   CREATININE 0.89 08/27/2017 0330   CREATININE 1.00 (H) 08/07/2017 1528   CALCIUM 8.4 (L) 08/27/2017 0330   PROT 6.0 (L) 08/19/2017 1807   ALBUMIN 3.6 08/19/2017 1807   AST 121 (H) 08/19/2017 1807   ALT 43 08/19/2017 1807   ALKPHOS 61 08/19/2017 1807   BILITOT 1.0 08/19/2017 1807   GFRNONAA >60 08/27/2017 0330   GFRNONAA 55 (L) 08/07/2017 1528   GFRAA >60 08/27/2017 0330   GFRAA 64 08/07/2017 1528    Recent Labs    08/19/17 1837  08/21/17 0344 08/22/17 0100  08/25/17 0500 08/26/17 0343 08/27/17 0330  NA  --    < > 146* 144   < > 143 143 141  K  --    < > 2.5* 2.7*   < > 3.5 3.6 4.4  CL  --    < > 111 110   < > 111 103 103  CO2  --    < > 19* 21*   < > 24 29 26   GLUCOSE  --    < > 99 118*   < > 90 93 83  BUN  --    < > 22* 19   < > 16 12 19   CREATININE  --    < > 0.97 0.85   < > 0.69 0.77 0.89  CALCIUM  --    < > 8.5* 8.4*   < > 8.2* 8.4* 8.4*  MG 2.7*  --  2.4 2.3  --   --   --   --    < > = values in this interval not displayed.   Liver Function Tests: Recent Labs    05/09/17 2116  07/01/17 1824 08/07/17 1528 08/19/17 1807  AST 48*   < > 19 15 121*  ALT 17   < > 9* 9 43  ALKPHOS 57  --  54  --  61  BILITOT 1.3*   < > 0.7 0.4 1.0  PROT 6.4*   < > 6.0* 6.6 6.0*  ALBUMIN 3.7  --  4.0  --  3.6   < > = values in this interval not displayed.   No results for input(s): LIPASE, AMYLASE in the last 8760 hours. Recent Labs    08/19/17 2128  AMMONIA 16   CBC: Recent Labs    07/01/17 1824 08/07/17 1528 08/19/17 1807  08/24/17 0520 08/25/17 0500 08/26/17 0343  WBC 5.4 7.2 14.3*   < > 8.6 6.7 7.2  NEUTROABS 3.8 5,148 12.7*  --   --   --   --   HGB 11.8* 12.4 9.7*    < > 9.3* 8.9* 9.3*  HCT 37.0 38.3 30.6*   < > 29.5* 28.1* 29.3*  MCV 93.0 87.0 87.9   < > 88.6 90.6 90.4  PLT 200 200 192   < > 223 207 236   < > = values in this interval not displayed.   Lipid Recent Labs    01/15/17 1649 05/10/17 0442 08/07/17 1528  CHOL 176 125 174  HDL 63 50 70  LDLCALC 90 50 83  TRIG 124 124 113    Cardiac Enzymes: Recent Labs    08/20/17 0357  08/21/17 0344 08/22/17 0754 08/23/17 0200 08/23/17 0622 08/23/17 1222  CKTOTAL 6,984*  --  3,967* 1,535*  --   --   --   TROPONINI  --    < >  --   --  1.14* 0.70* 0.58*   < > = values in this interval not displayed.   BNP: No results for input(s): BNP in the last 8760 hours. Lab Results  Component Value Date   MICROALBUR 0.6 08/07/2017   Lab Results  Component Value Date   HGBA1C 5.4 08/07/2017   Lab Results  Component Value Date   TSH 1.617 08/19/2017   Lab Results  Component Value Date   VITAMINB12 1,042 (H) 08/19/2017   Lab Results  Component Value Date   FOLATE 11.0 08/19/2017   Lab Results  Component Value Date   IRON 31 08/19/2017   TIBC 291 08/19/2017   FERRITIN 119 08/19/2017    Imaging and Procedures obtained prior to SNF admission: Ct Abdomen Pelvis Wo Contrast  Result Date: 08/20/2017 CLINICAL DATA:  Anemia.  Recent fall. EXAM: CT ABDOMEN AND PELVIS WITHOUT CONTRAST TECHNIQUE: Multidetector CT imaging of the abdomen and pelvis was performed following the standard protocol without IV contrast. COMPARISON:  Lumbar spine MRI 09/29/2013. FINDINGS: Lower chest: Patchy ground-glass opacities in both lung bases with interlobular septal thickening and small pleural effusions. Decreased attenuation of the blood pool compatible with anemia. Coronary artery atherosclerosis. Hepatobiliary: No focal liver abnormality is seen. There is moderate distention of the gallbladder without gross wall thickening or acute inflammatory changes. There is no biliary dilatation. Pancreas: Unremarkable.  Spleen: Unremarkable. Adrenals/Urinary Tract: Mild bilateral adrenal gland thickening. Punctate right and possibly left renal calculi. No hydronephrosis. Unremarkable bladder. Stomach/Bowel: The stomach is within normal limits. A moderately large amount of stool is present in the colon and rectum. There is no evidence of bowel obstruction. Vascular/Lymphatic: Abdominal aortic atherosclerosis without aneurysm. No enlarged lymph nodes. Reproductive: Status post hysterectomy. No adnexal masses. Other: No intraperitoneal free fluid.  No retroperitoneal hematoma. Musculoskeletal: Multiple old left rib fractures. Chronic L3 and L4 compression fractures. Bilateral L4 pars defects with 9 mm anterolisthesis of L4 on L5. 6 mm anterolisthesis of L3 on L4. IMPRESSION: 1. No acute abnormality identified in the abdomen or pelvis. 2. Ground-glass opacities in both lung bases. Potential etiologies include acute causes such as edema,  infection, and pulmonary hemorrhage as well as chronic processes including interstitial lung disease. 3. Small pleural effusions. 4. Nonobstructing nephrolithiasis. 5. Moderately large amount of stool in the colon and rectum. No bowel obstruction. Electronically Signed   By: Logan Bores M.D.   On: 08/20/2017 17:54   Ct Head Wo Contrast  Result Date: 08/19/2017 CLINICAL DATA:  72 year old female found on floor by family. Last seen 3 days ago. Initial encounter. EXAM: CT HEAD WITHOUT CONTRAST CT CERVICAL SPINE WITHOUT CONTRAST TECHNIQUE: Multidetector CT imaging of the head and cervical spine was performed following the standard protocol without intravenous contrast. Multiplanar CT image reconstructions of the cervical spine were also generated. COMPARISON:  07/02/2017 MR. 07/01/2016 CT head and cervical spine FINDINGS: CT HEAD FINDINGS Brain: Exam is motion degraded. Resolution of previously noted left-sided chronic subdural hematoma. No new intracranial hemorrhage noted. Chronic microvascular  changes without CT evidence of large acute infarct. Global atrophy. No intracranial mass lesion noted on this unenhanced exam. Vascular: Vascular calcifications Skull: No skull fracture Sinuses/Orbits: No acute orbital abnormality. Visualized paranasal sinuses are clear. Mastoid air cells and middle ear cavities are clear. Temporomandibular joint degenerative changes. Other: Left parietal scalp hematoma. CT CERVICAL SPINE FINDINGS Alignment: Curvature cervical spine convex left. Skull base and vertebrae: No cervical spine fracture noted. Soft tissues and spinal canal: No abnormal prevertebral soft tissue swelling. Disc levels: Multilevel cervical spondylotic changes most prominent C5-6 level. Upper chest: Scarring lung apices. Other: Bilateral carotid bifurcation calcifications. IMPRESSION: Exams are motion degraded. Interval clearing of previously noted left-sided chronic subdural hematoma. No new intracranial hemorrhage noted. Chronic microvascular changes without CT evidence of large acute infarct. Atrophy. No cervical spine fracture detected. Curvature cervical spine convex left. No abnormal prevertebral soft tissue swelling. Cervical spondylotic changes similar to prior exam. Electronically Signed   By: Genia Del M.D.   On: 08/19/2017 19:47   Ct Cervical Spine Wo Contrast  Result Date: 08/19/2017 CLINICAL DATA:  72 year old female found on floor by family. Last seen 3 days ago. Initial encounter. EXAM: CT HEAD WITHOUT CONTRAST CT CERVICAL SPINE WITHOUT CONTRAST TECHNIQUE: Multidetector CT imaging of the head and cervical spine was performed following the standard protocol without intravenous contrast. Multiplanar CT image reconstructions of the cervical spine were also generated. COMPARISON:  07/02/2017 MR. 07/01/2016 CT head and cervical spine FINDINGS: CT HEAD FINDINGS Brain: Exam is motion degraded. Resolution of previously noted left-sided chronic subdural hematoma. No new intracranial hemorrhage  noted. Chronic microvascular changes without CT evidence of large acute infarct. Global atrophy. No intracranial mass lesion noted on this unenhanced exam. Vascular: Vascular calcifications Skull: No skull fracture Sinuses/Orbits: No acute orbital abnormality. Visualized paranasal sinuses are clear. Mastoid air cells and middle ear cavities are clear. Temporomandibular joint degenerative changes. Other: Left parietal scalp hematoma. CT CERVICAL SPINE FINDINGS Alignment: Curvature cervical spine convex left. Skull base and vertebrae: No cervical spine fracture noted. Soft tissues and spinal canal: No abnormal prevertebral soft tissue swelling. Disc levels: Multilevel cervical spondylotic changes most prominent C5-6 level. Upper chest: Scarring lung apices. Other: Bilateral carotid bifurcation calcifications. IMPRESSION: Exams are motion degraded. Interval clearing of previously noted left-sided chronic subdural hematoma. No new intracranial hemorrhage noted. Chronic microvascular changes without CT evidence of large acute infarct. Atrophy. No cervical spine fracture detected. Curvature cervical spine convex left. No abnormal prevertebral soft tissue swelling. Cervical spondylotic changes similar to prior exam. Electronically Signed   By: Genia Del M.D.   On: 08/19/2017 19:47   Dg Chest [facility]  1 View  Result Date: 08/19/2017 CLINICAL DATA:  Found down today. EXAM: PORTABLE CHEST 1 VIEW COMPARISON:  Chest radiograph July 01, 2017 FINDINGS: Cardiac silhouette is mildly enlarged and unchanged. Calcified aortic knob. Chronic interstitial changes without pleural effusion or focal consolidation. No pneumothorax. Stable apical pleural thickening. Osteopenia. Severe LEFT shoulder osteoarthrosis, incompletely imaged. IMPRESSION: Stable cardiomegaly and chronic interstitial changes. Aortic Atherosclerosis (ICD10-I70.0). Electronically Signed   By: Elon Alas M.D.   On: 08/19/2017 18:26     Not all labs,  radiology exams or other studies done during hospitalization come through on my EPIC note; however they are reviewed by me.    Assessment and Plan  Acute encephalopathy/benzodiazepine overdose/progressive dementia- bacterial and HSV meningeal encephalitis ruled out by LP without WBCs and negative for HSV PCR.  LP showed increased RBCs consistent with resolving known SDH; symptoms resolved to her baseline of mild cognitive impairment; Xanax was stopped and replaced with clonazepam with plans to taper it over the next 1 to 2 months SNF -admitted for OT/PT; continue clonazepam 0.25 mg p.o. nightly with plans to taper  New onset atrial fibrillation/hypokalemia- felt to be precipitated largely while low potassium, 2.7 patient not a candidate for anticoagulation at present due to her inability to care for herself at home and unwillingness to allow supervision; her home bisoprolol was held and she maintained sinus rhythm after correction of hypokalemia SNF -ASA 325 daily as prophylaxis; patient in normal sinus rhythm; continue K-Dur 10 mEq p.o. Daily; BMP 1 week  Acute on chronic diastolic congestive heart failure-developed pulmonary edema and hypoxia with atrial fibrillation; echo showed normal LV EF, grade 2 diastolic dysfunction; patient started on Spironolactone and Lasix both of which had to be DC'd due to low blood pressures SNF -patient on no diuretics; will monitor weight gain  Rhabdomyolysis- trended down, and resolved  Anemia- elevated by GI and no source of blood found; etiology of anemia unclear SNF -repeat CBC  Bipolar disorder SNF -no status uncontrolled; continue Lamictal 100 mg daily and Neurontin 300 mg nightly  Depression SNF -not stated as uncontrolled; continue Wellbutrin XL 300 mg daily and Zoloft 200 mg daily  Hyperlipidemia SNF -not stated as uncontrolled; continue Crestor 20 mg daily    Time spent greater than 45 minutes;> 50% of time with patient was spent reviewing  records, labs, tests and studies, counseling and developing plan of care  Webb Silversmith D. Sheppard Coil, MD

## 2017-09-07 ENCOUNTER — Encounter: Payer: Self-pay | Admitting: Internal Medicine

## 2017-09-07 DIAGNOSIS — D509 Iron deficiency anemia, unspecified: Secondary | ICD-10-CM | POA: Insufficient documentation

## 2017-09-07 DIAGNOSIS — I4891 Unspecified atrial fibrillation: Secondary | ICD-10-CM

## 2017-09-07 DIAGNOSIS — I480 Paroxysmal atrial fibrillation: Secondary | ICD-10-CM | POA: Insufficient documentation

## 2017-09-07 DIAGNOSIS — D649 Anemia, unspecified: Secondary | ICD-10-CM | POA: Insufficient documentation

## 2017-09-07 DIAGNOSIS — F0390 Unspecified dementia without behavioral disturbance: Secondary | ICD-10-CM | POA: Insufficient documentation

## 2017-09-07 DIAGNOSIS — I5032 Chronic diastolic (congestive) heart failure: Secondary | ICD-10-CM | POA: Insufficient documentation

## 2017-09-07 DIAGNOSIS — T424X1A Poisoning by benzodiazepines, accidental (unintentional), initial encounter: Secondary | ICD-10-CM | POA: Insufficient documentation

## 2017-09-07 HISTORY — DX: Unspecified atrial fibrillation: I48.91

## 2017-09-16 ENCOUNTER — Non-Acute Institutional Stay (SKILLED_NURSING_FACILITY): Payer: Medicare Other | Admitting: Internal Medicine

## 2017-09-16 ENCOUNTER — Encounter: Payer: Self-pay | Admitting: Internal Medicine

## 2017-09-16 DIAGNOSIS — T796XXD Traumatic ischemia of muscle, subsequent encounter: Secondary | ICD-10-CM

## 2017-09-16 DIAGNOSIS — G300 Alzheimer's disease with early onset: Secondary | ICD-10-CM

## 2017-09-16 DIAGNOSIS — G9340 Encephalopathy, unspecified: Secondary | ICD-10-CM

## 2017-09-16 DIAGNOSIS — E876 Hypokalemia: Secondary | ICD-10-CM | POA: Diagnosis not present

## 2017-09-16 DIAGNOSIS — F02818 Dementia in other diseases classified elsewhere, unspecified severity, with other behavioral disturbance: Secondary | ICD-10-CM

## 2017-09-16 DIAGNOSIS — I480 Paroxysmal atrial fibrillation: Secondary | ICD-10-CM | POA: Diagnosis not present

## 2017-09-16 DIAGNOSIS — F0281 Dementia in other diseases classified elsewhere with behavioral disturbance: Secondary | ICD-10-CM

## 2017-09-16 DIAGNOSIS — I5033 Acute on chronic diastolic (congestive) heart failure: Secondary | ICD-10-CM | POA: Diagnosis not present

## 2017-09-16 DIAGNOSIS — T424X1D Poisoning by benzodiazepines, accidental (unintentional), subsequent encounter: Secondary | ICD-10-CM

## 2017-09-16 DIAGNOSIS — F319 Bipolar disorder, unspecified: Secondary | ICD-10-CM | POA: Diagnosis not present

## 2017-09-16 DIAGNOSIS — E782 Mixed hyperlipidemia: Secondary | ICD-10-CM

## 2017-09-16 DIAGNOSIS — D649 Anemia, unspecified: Secondary | ICD-10-CM

## 2017-09-16 DIAGNOSIS — F418 Other specified anxiety disorders: Secondary | ICD-10-CM

## 2017-09-16 NOTE — Progress Notes (Signed)
Location:  [facility] Room Number: 240X Place of Service:  SNF [phone]) Angelica Kelley. Angelica Coil, MD  PCP: Angelica Pinto, MD Patient Care Team: Angelica Pinto, MD as PCP - General (Internal Medicine) Nahser, Wonda Cheng, MD as PCP - Cardiology (Cardiology) Angelica Campbell, MD as Consulting Physician (Gastroenterology) Angelica May, MD as Consulting Physician (Psychiatry) Angelica Leitz, MD as Consulting Physician (Orthopedic Surgery)  Extended Emergency Contact Information Primary Emergency Contact: AngelicaLibby Address: [address]           [place],  [address] [place] Phone: [phone] Relation: Niece  Allergies  Allergen Reactions  . Lipitor [Atorvastatin]     Fatigue  . Prednisone Other (See Comments)    Change in mental status    Chief Complaint  Patient presents with  . Discharge Note    Discharged from SNF    HPI:  72 y.o. female with hypertension, hyperlipidemia, prediabetes, TIA, and bipolar disorder, who was found to be confused and on the floor of her apartment by her niece on the afternoon of presentation to the emergency department.  Patient was admitted to [facility] from 4/8-16 where she was felt to have an acute encephalopathy from suspected benzodiazepine overdose.  Hospital course was complicated by rhabdomyolysis, new onset atrial fibrillation, and acute on chronic diastolic congestive failure, all of which resolved.  Patient was admitted to skilled nursing facility for OT/PT.  Patient is now ready to be discharged to home.    Past Medical History:  Diagnosis Date  . Alcoholism ([place])   . Anxiety   . Depression   . Elevated hemoglobin A1c   . Fracture of right wrist   . GERD (gastroesophageal reflux disease)   . Heart murmur   . Hyperlipidemia   . Hypertension   . IBS (irritable bowel syndrome)     Past Surgical History:  Procedure Laterality Date  . ABDOMINAL HYSTERECTOMY   1991  . APPENDECTOMY    . BREAST SURGERY Left 1989   [facility]  . CARPAL TUNNEL RELEASE Right 10/18/2014   Procedure: CARPAL TUNNEL RELEASE;  Surgeon: Angelica Leitz, MD;  Location: [facility];  Service: Orthopedics;  Laterality: Right;  . [place]  . NECK SURGERY  Remote    Ruptured disc, per patient. Got infected.   . ORIF WRIST FRACTURE Right 10/18/2014   Procedure: OPEN REDUCTION INTERNAL FIXATION (ORIF) RIGHT WRIST FRACTURE;  Surgeon: Angelica Leitz, MD;  Location: [facility];  Service: Orthopedics;  Laterality: Right;  . TONSILLECTOMY  1965  . TUBAL LIGATION       reports that she quit smoking about 7 years ago. Her smoking use included cigarettes. She has never used smokeless tobacco. She reports that she drank alcohol. She reports that she does not use drugs. Social History   Socioeconomic History  . Marital status: Divorced    Spouse name: Not on file  . Number of children: Not on file  . Years of education: Not on file  . Highest education level: Not on file  Occupational History  . Occupation: Retired  Scientific laboratory technician  . Financial resource strain: Not on file  . Food insecurity:    Worry: Not on file    Inability: Not on file  . Transportation needs:    Medical: Not on file    Non-medical: Not on file  Tobacco Use  . Smoking status: Former Smoker    Types: Cigarettes    Last attempt  to quit: 05/14/2010    Years since quitting: 7.3  . Smokeless tobacco: Never Used  . Tobacco comment: second hand smoke exposure also  Substance and Sexual Activity  . Alcohol use: Not Currently    Comment: Quit 2002, denies she ever abused it.  . Drug use: No  . Sexual activity: Never  Lifestyle  . Physical activity:    Days per week: Not on file    Minutes per session: Not on file  . Stress: Not on file  Relationships  . Social connections:    Talks on phone: Not on file    Gets together: Not on file    Attends religious service: Not on file    Active member of club or  organization: Not on file    Attends meetings of clubs or organizations: Not on file    Relationship status: Not on file  . Intimate partner violence:    Fear of current or ex partner: Not on file    Emotionally abused: Not on file    Physically abused: Not on file    Forced sexual activity: Not on file  Other Topics Concern  . Not on file  Social History Narrative   07/05/17 Pt's niece, phone number [phone]. Pt lives alone at home, and doesn't use a cane or walker, is still pretty active. Never smoker.    1 daughter- deceased   Education- 58   Retired from [employer]   Caffeine- coffee,1 daily, soda 1 daily    Pertinent  Health Maintenance Due  Topic Date Due  . COLONOSCOPY  03/27/2017  . INFLUENZA VACCINE  12/12/2017  . MAMMOGRAM  05/18/2018  . DEXA SCAN  Completed  . PNA vac Low Risk Adult  Completed    Medications: Allergies as of 09/16/2017      Reactions   Lipitor [atorvastatin]    Fatigue   Prednisone Other (See Comments)   Change in mental status      Medication List        Accurate as of 09/16/17  1:41 PM. Always use your most recent med list.          aspirin 325 MG tablet Take 325 mg by mouth daily.   buPROPion 300 MG 24 hr tablet Commonly known as:  WELLBUTRIN XL Take 1 tablet (300 mg total) by mouth daily.   clonazePAM 0.25 MG disintegrating tablet Commonly known as:  KLONOPIN Take 1 (0.25 mg) tablet by mouth at bedtime x 1 month then taper to 0.125mg  x 1 month then stop   gabapentin 300 MG capsule Commonly known as:  NEURONTIN Take 300 mg by mouth at bedtime.   lamoTRIgine 200 MG tablet Commonly known as:  LAMICTAL Take 400 mg by mouth daily.   midodrine 5 MG tablet Commonly known as:  PROAMATINE Take 5 mg by mouth 3 (three) times daily with meals.   potassium chloride 10 MEQ tablet Commonly known as:  K-DUR Take 1 tablet (10 mEq total) by mouth daily.   rosuvastatin 20 MG tablet Commonly known as:  CRESTOR Take 20 mg by  mouth daily.   sertraline 100 MG tablet Commonly known as:  ZOLOFT Take 200 mg by mouth daily.   vitamin B-1 250 MG tablet Take 250 mg by mouth 2 (two) times daily.   Vitamin D3 5000 units Caps Take 5,000 Units by mouth daily. Only take 5 days  week        Vitals:   09/16/17 1102  BP: 124/72  Pulse: 64  Resp: 18  Temp: (!) 97.4 F (36.3 C)  TempSrc: Oral  SpO2: 98%  Weight: 130 lb 6.4 oz (59.1 kg)  Height: 5\' 4"  (1.626 m)   Body mass index is 22.38 kg/m.  Physical Exam  GENERAL APPEARANCE: Alert, conversant. No acute distress.  HEENT: Unremarkable. RESPIRATORY: Breathing is even, unlabored. Lung sounds are clear   CARDIOVASCULAR: Heart RRR 4/6 murmur no rubs or gallops. No peripheral edema.  GASTROINTESTINAL: Abdomen is soft, non-tender, not distended w/ normal bowel sounds.  NEUROLOGIC: Cranial nerves 2-12 grossly intact. Moves all extremities   Labs reviewed: Basic Metabolic Panel: Recent Labs    08/19/17 1837  08/21/17 0344 08/22/17 0100  08/25/17 0500 08/26/17 0343 08/27/17 0330 09/02/17  NA  --    < > 146* 144   < > 143 143 141 142  K  --    < > 2.5* 2.7*   < > 3.5 3.6 4.4 4.9  CL  --    < > 111 110   < > 111 103 103  --   CO2  --    < > 19* 21*   < > 24 29 26   --   GLUCOSE  --    < > 99 118*   < > 90 93 83  --   BUN  --    < > 22* 19   < > 16 12 19  22*  CREATININE  --    < > 0.97 0.85   < > 0.69 0.77 0.89 0.9  CALCIUM  --    < > 8.5* 8.4*   < > 8.2* 8.4* 8.4*  --   MG 2.7*  --  2.4 2.3  --   --   --   --   --    < > = values in this interval not displayed.   Lab Results  Component Value Date   MICROALBUR 0.6 08/07/2017   Liver Function Tests: Recent Labs    05/09/17 2116  07/01/17 1824 08/07/17 1528 08/19/17 1807  AST 48*   < > 19 15 121*  ALT 17   < > 9* 9 43  ALKPHOS 57  --  54  --  61  BILITOT 1.3*   < > 0.7 0.4 1.0  PROT 6.4*   < > 6.0* 6.6 6.0*  ALBUMIN 3.7  --  4.0  --  3.6   < > = values in this interval not displayed.   No  results for input(s): LIPASE, AMYLASE in the last 8760 hours. Recent Labs    08/19/17 2128  AMMONIA 16   CBC: Recent Labs    07/01/17 1824 08/07/17 1528 08/19/17 1807  08/24/17 0520 08/25/17 0500 08/26/17 0343 09/02/17  WBC 5.4 7.2 14.3*   < > 8.6 6.7 7.2 6.9  NEUTROABS 3.8 5,148 12.7*  --   --   --   --   --   HGB 11.8* 12.4 9.7*   < > 9.3* 8.9* 9.3* 10.4*  HCT 37.0 38.3 30.6*   < > 29.5* 28.1* 29.3* 32*  MCV 93.0 87.0 87.9   < > 88.6 90.6 90.4  --   PLT 200 200 192   < > 223 207 236 266   < > = values in this interval not displayed.   Lipid Recent Labs    01/15/17 1649 05/10/17 0442 08/07/17 1528  CHOL 176 125 174  HDL 63 50 70  LDLCALC 90 50 83  TRIG  124 124 113   Cardiac Enzymes: Recent Labs    08/20/17 0357  08/21/17 0344 08/22/17 0754 08/23/17 0200 08/23/17 0622 08/23/17 1222  CKTOTAL 6,984*  --  3,967* 1,535*  --   --   --   TROPONINI  --    < >  --   --  1.14* 0.70* 0.58*   < > = values in this interval not displayed.   BNP: No results for input(s): BNP in the last 8760 hours. CBG: Recent Labs    08/27/17 0317 08/27/17 0805 08/27/17 1130  GLUCAP 80 89 109*    Procedures and Imaging Studies During Stay: Ct Abdomen Pelvis Wo Contrast  Result Date: 08/20/2017 CLINICAL DATA:  Anemia.  Recent fall. EXAM: CT ABDOMEN AND PELVIS WITHOUT CONTRAST TECHNIQUE: Multidetector CT imaging of the abdomen and pelvis was performed following the standard protocol without IV contrast. COMPARISON:  Lumbar spine MRI 09/29/2013. FINDINGS: Lower chest: Patchy ground-glass opacities in both lung bases with interlobular septal thickening and small pleural effusions. Decreased attenuation of the blood pool compatible with anemia. Coronary artery atherosclerosis. Hepatobiliary: No focal liver abnormality is seen. There is moderate distention of the gallbladder without gross wall thickening or acute inflammatory changes. There is no biliary dilatation. Pancreas: Unremarkable.  Spleen: Unremarkable. Adrenals/Urinary Tract: Mild bilateral adrenal gland thickening. Punctate right and possibly left renal calculi. No hydronephrosis. Unremarkable bladder. Stomach/Bowel: The stomach is within normal limits. A moderately large amount of stool is present in the colon and rectum. There is no evidence of bowel obstruction. Vascular/Lymphatic: Abdominal aortic atherosclerosis without aneurysm. No enlarged lymph nodes. Reproductive: Status post hysterectomy. No adnexal masses. Other: No intraperitoneal free fluid.  No retroperitoneal hematoma. Musculoskeletal: Multiple old left rib fractures. Chronic L3 and L4 compression fractures. Bilateral L4 pars defects with 9 mm anterolisthesis of L4 on L5. 6 mm anterolisthesis of L3 on L4. IMPRESSION: 1. No acute abnormality identified in the abdomen or pelvis. 2. Ground-glass opacities in both lung bases. Potential etiologies include acute causes such as edema, infection, and pulmonary hemorrhage as well as chronic processes including interstitial lung disease. 3. Small pleural effusions. 4. Nonobstructing nephrolithiasis. 5. Moderately large amount of stool in the colon and rectum. No bowel obstruction. Electronically Signed   By: Logan Bores M.D.   On: 08/20/2017 17:54   Ct Head Wo Contrast  Result Date: 08/19/2017 CLINICAL DATA:  72 year old female found on floor by family. Last seen 3 days ago. Initial encounter. EXAM: CT HEAD WITHOUT CONTRAST CT CERVICAL SPINE WITHOUT CONTRAST TECHNIQUE: Multidetector CT imaging of the head and cervical spine was performed following the standard protocol without intravenous contrast. Multiplanar CT image reconstructions of the cervical spine were also generated. COMPARISON:  07/02/2017 MR. 07/01/2016 CT head and cervical spine FINDINGS: CT HEAD FINDINGS Brain: Exam is motion degraded. Resolution of previously noted left-sided chronic subdural hematoma. No new intracranial hemorrhage noted. Chronic microvascular  changes without CT evidence of large acute infarct. Global atrophy. No intracranial mass lesion noted on this unenhanced exam. Vascular: Vascular calcifications Skull: No skull fracture Sinuses/Orbits: No acute orbital abnormality. Visualized paranasal sinuses are clear. Mastoid air cells and middle ear cavities are clear. Temporomandibular joint degenerative changes. Other: Left parietal scalp hematoma. CT CERVICAL SPINE FINDINGS Alignment: Curvature cervical spine convex left. Skull base and vertebrae: No cervical spine fracture noted. Soft tissues and spinal canal: No abnormal prevertebral soft tissue swelling. Disc levels: Multilevel cervical spondylotic changes most prominent C5-6 level. Upper chest: Scarring lung apices. Other: Bilateral carotid bifurcation  calcifications. IMPRESSION: Exams are motion degraded. Interval clearing of previously noted left-sided chronic subdural hematoma. No new intracranial hemorrhage noted. Chronic microvascular changes without CT evidence of large acute infarct. Atrophy. No cervical spine fracture detected. Curvature cervical spine convex left. No abnormal prevertebral soft tissue swelling. Cervical spondylotic changes similar to prior exam. Electronically Signed   By: Genia Del M.D.   On: 08/19/2017 19:47   Ct Cervical Spine Wo Contrast  Result Date: 08/19/2017 CLINICAL DATA:  72 year old female found on floor by family. Last seen 3 days ago. Initial encounter. EXAM: CT HEAD WITHOUT CONTRAST CT CERVICAL SPINE WITHOUT CONTRAST TECHNIQUE: Multidetector CT imaging of the head and cervical spine was performed following the standard protocol without intravenous contrast. Multiplanar CT image reconstructions of the cervical spine were also generated. COMPARISON:  07/02/2017 MR. 07/01/2016 CT head and cervical spine FINDINGS: CT HEAD FINDINGS Brain: Exam is motion degraded. Resolution of previously noted left-sided chronic subdural hematoma. No new intracranial hemorrhage  noted. Chronic microvascular changes without CT evidence of large acute infarct. Global atrophy. No intracranial mass lesion noted on this unenhanced exam. Vascular: Vascular calcifications Skull: No skull fracture Sinuses/Orbits: No acute orbital abnormality. Visualized paranasal sinuses are clear. Mastoid air cells and middle ear cavities are clear. Temporomandibular joint degenerative changes. Other: Left parietal scalp hematoma. CT CERVICAL SPINE FINDINGS Alignment: Curvature cervical spine convex left. Skull base and vertebrae: No cervical spine fracture noted. Soft tissues and spinal canal: No abnormal prevertebral soft tissue swelling. Disc levels: Multilevel cervical spondylotic changes most prominent C5-6 level. Upper chest: Scarring lung apices. Other: Bilateral carotid bifurcation calcifications. IMPRESSION: Exams are motion degraded. Interval clearing of previously noted left-sided chronic subdural hematoma. No new intracranial hemorrhage noted. Chronic microvascular changes without CT evidence of large acute infarct. Atrophy. No cervical spine fracture detected. Curvature cervical spine convex left. No abnormal prevertebral soft tissue swelling. Cervical spondylotic changes similar to prior exam. Electronically Signed   By: Genia Del M.D.   On: 08/19/2017 19:47   Dg Chest Port 1 View  Result Date: 08/22/2017 CLINICAL DATA:  Wheezing and tachypnea. EXAM: PORTABLE CHEST 1 VIEW COMPARISON:  Radiograph 08/19/2017 FINDINGS: Right upper extremity PICC with tip at the atrial caval junction. Unchanged cardiomegaly and mediastinal contours. Again seen aortic atherosclerosis. Increased interstitial thickening from prior exam with possible Kerley B-lines. Scattered atelectasis at the left lung base and right mid lung. No confluent consolidation. No large pleural effusion, left costophrenic angle excluded from the field of view. No pneumothorax. IMPRESSION: 1. Unchanged cardiomegaly and aortic  atherosclerosis. Suggestion of new mild pulmonary edema superimposed on chronic interstitial thickening. 2. Scattered atelectasis. Electronically Signed   By: Jeb Levering M.D.   On: 08/22/2017 04:14   Dg Chest Port 1 View  Result Date: 08/19/2017 CLINICAL DATA:  Found down today. EXAM: PORTABLE CHEST 1 VIEW COMPARISON:  Chest radiograph July 01, 2017 FINDINGS: Cardiac silhouette is mildly enlarged and unchanged. Calcified aortic knob. Chronic interstitial changes without pleural effusion or focal consolidation. No pneumothorax. Stable apical pleural thickening. Osteopenia. Severe LEFT shoulder osteoarthrosis, incompletely imaged. IMPRESSION: Stable cardiomegaly and chronic interstitial changes. Aortic Atherosclerosis (ICD10-I70.0). Electronically Signed   By: Elon Alas M.D.   On: 08/19/2017 18:26   [place] Ekg Site Rite  Result Date: 08/21/2017 If Site Rite image not attached, placement could not be confirmed due to current cardiac rhythm.  Dg Fluoro Guide Lumbar Puncture  Result Date: 08/22/2017 CLINICAL DATA:  Encephalopathy, possible meningitis EXAM: DIAGNOSTIC LUMBAR PUNCTURE UNDER FLUOROSCOPIC GUIDANCE FLUOROSCOPY  TIME:  Fluoroscopy Time:  0 minutes, 30 seconds Radiation Exposure Index (if provided by the fluoroscopic device): 3.4 mGy Number of Acquired Spot Images: 0 PROCEDURE: I discussed the risks (including hemorrhage, infection, headache, and nerve damage, among others), benefits, and alternatives to fluoroscopically guided lumbar puncture with the patient's niece by telephone. We specifically discussed the high technical likelihood of success of the procedure. The patient's niece understood understood and elected for the patient to undergo the procedure. Telephone consent policies were followed. Standard time-out was employed. Following sterile skin prep and local anesthetic administration consisting of 1 percent lidocaine, a 22 gauge spinal needle was advanced without difficulty  into the thecal sac at the at the L2-3 level. Clear CSF was returned. Opening pressure was not obtained. 12 cc of clear CSF was collected. The needle was subsequently removed and the skin cleansed and bandaged. No immediate complications were observed. IMPRESSION: 1. Fluoroscopically guided lumbar puncture yielding 12 cc of clear CSF. No complications were observed. Electronically Signed   By: Van Clines M.D.   On: 08/22/2017 14:20    Assessment/Plan:   Benzodiazepine overdose, accidental or unintentional, subsequent encounter  Acute encephalopathy  Early onset Alzheimer's disease with behavioral disturbance  Paroxysmal atrial fibrillation (HCC)  Acute on chronic diastolic (congestive) heart failure (HCC)  Hypokalemia  Bipolar depression ([place])  Traumatic rhabdomyolysis, subsequent encounter  Depression with anxiety  Hyperlipidemia, mixed  Chronic anemia   Patient is being discharged with the following home health services: OT/PT  Patient is being discharged with the following durable medical equipment: None  Patient has been advised to f/u with their PCP in 1-2 weeks to bring them up to date on their rehab stay.  Social services at facility was responsible for arranging this appointment.  Pt was provided with a 30 day supply of prescriptions for medications and refills must be obtained from their PCP.  For controlled substances, a more limited supply Kelley be provided adequate until PCP appointment only.  Medications have been reconciled.  Time spent greater than 30 minutes;> 50% of time with patient was spent reviewing records, labs, tests and studies, counseling and developing plan of care  Anne. Sheral Apley, MD

## 2017-09-19 ENCOUNTER — Encounter: Payer: Self-pay | Admitting: Adult Health

## 2017-09-19 NOTE — Progress Notes (Deleted)
Hospital follow up  Assessment and Plan: Hospital visit follow up for: Acute encephalopathy secondary to suspected benzo overdose Hospital discharge meds were reviewed, and reconciled with the patient.   Acute encephalopathy Resolved; back to baseline  Paroxysmal atrial fibrillation (HCC) Rate/rhythm controlled today CHADS2Vasc of 4  Patient is not a candidate for anticoagulation at present primarily due to her inability to care for self at home and unwillingness to allow supervision (an ongong issue, APS has been involved).  If this were to change (I.e. She were to enter assisted living or have live in aide), considerations may change, and anticoagulation would be recommended.    She is alternately on ASA 325 mg   Acute on chronic diastolic (congestive) heart failure (HCC) Off of diuretics secondary to hypotension and AKI Limit sodium intake  Weigh daily and restart Lasix for weight gain >5 lbs or leg swelling  Traumatic rhabdomyolysis, subsequent encounter Resolved  Acute renal failure superimposed on stage 2 chronic kidney disease, unspecified acute renal failure type (HCC) Check CMP/GFR, push fluids; back to baseline of CKD 2 on discharge  Hypokalemia Resolved; check CMP/GFR  Benzodiazepine overdose, accidental or unintentional, subsequent encounter ****  Bipolar depression (HCC) *** Her bupropion was lowered in setting of tapering benzodiazepine, given higher risk of seizure with this medicine.  Would plan to taper further and replace with alternative, Abilify, mirtazapine.       There are no discontinued medications.  Over 40 minutes of exam, counseling, chart review, and complex, high/moderate level critical decision making was performed this visit.   Future Appointments  Date Time Provider [place]  09/20/2017  3:45 PM Daune Perch, NP [facility]  09/23/2017 10:00 AM Liane Comber, NP [facility] None  10/02/2017 11:15 AM Venancio Poisson, NP [facility] None  11/19/2017  2:00 PM Liane Comber, NP [facility] None  02/19/2018  2:30 PM Unk Pinto, MD [facility] None  09/17/2018  3:00 PM Unk Pinto, MD [facility] None     HPI 72 y.o.female  presents for follow up for transition from recent hospitalization or SNIF stay. Admit date to the hospital was 08/19/17, patient was discharged from the hospital on 08/27/17 and our clinical staff contacted the office the day after discharge to set up a follow up appointment. The discharge summary, medications, and diagnostic test results were reviewed before meeting with the patient. The patient was admitted for:   1. Taper clonazepam to 0.125 mg at night in 1 month, then stop 2. Further reduce Bupropion and replace with alternative anti-depressant   Hospital course:  Mirage Pfefferkorn Finchis a72 y.o.femalewith a historybipolar disorder and extensive family hx of substance abuse and death r/t overdoser, TIA, presented to the ED on 08/19/2017 by EMS, after being found on the ground by her niece with widespread bruising, grossly altered from baseline with difficulty breathing. On evaluation she was afebrile, hypoxic and encephalopathic.CT headrevealed interval clearing of her SDH (this had been noted also as an incidental finding on CT recently), andC-spine showed no fracture. CXR unremarkable. Hgb down from 12.4 in March to 9.7 on arrival and has continued to trend downward to 8.2 with negative FOBT, INR 1.1, no features of hemolysis. Creatinine up from 1 to 1.8 with CK elevated to 6,984. Due to ongoing agitation, ativan was given and soft mittens placed though LP is unobtainable. MRI and EEG are ordered and empiric coverage for meningitis/encephalitis started per ID phone recommendations. GI were consulted due to an initial concern for blood around her mouth.  Acute encephalopathy  Acute encephalopathy was suspected to be secondary to benzo overdose superimposed on progressing  dementia.Bacterial and HSV meningoencephalitis were ruled out by LP without WBCs and negative HSV PCR.  LP showed increased RBCs, consistent with resolving known SDH. Nothing focal to suggest stroke. Symptoms mostly resolved to her baseline mild cognitive impairment.  Xanax was stopped and replaced with clonazepam 0.25 nightly, with plans to taper to 0.125 in 1 month then d/c 1 month after, and to avoid benzodiazepines in the future for this at risk patient.  Her lamictal and gabapentin were continued.    New onset atrial fibrillation This was a new finding after admission.  It's contribution to her presentation is not clear, but likely contributed.  It was precipitated largely by low potassium (2.7 mmol/L on admission).   -CHADS2Vasc would be 4 (history of TIA, age, gender).  -Patient is not a candidate for anticoagulation at present primarily due to her inability to care for self at home and unwillingness to allow supervision (an ongong issue, APS has been involved).  -If this were to change (I.e. She were to enter assisted living or have live in aide), considerations may change, and anticoagulation would be recommended.   -Her home bisoprolol was held and she maintained sinus rhythm after correction of hypokalemia.  -She is alternately on ASA 325 mg   Acute on chronic diastolic CHF Developed pulmonary edema and hypoxia with A fib.  Echo shows normal LVEF, grade 2 diastolic dysfunction, elevated filling pressures, no RWMA.  Started on spironolactone, and Lasix.  Both d/c'd on discharge due to lower blood pressures, orthostasis.   Hypokalemia Magnesium normal.  K low and difficult to increase.  HCTZ stopped. K supplement continued.  Repeat K in 1 week.    Anemia Evaluated by GI.  No signs of blood in stool. FOBT negative. GI bleeding low likelihood. Unclear etiology of anemia.  Rhabdomyolysis Nontraumatic, found down. Resolved.  Bipolar disorder Her lamotrigine, gabapentin,  and sertraline were continued.  Her bupropion was lowered in setting of tapering benzodiazepine, given higher risk of seizure with this medicine.  Would plan to taper further and replace with alternative, Abilify, mirtazapine.      SNF course:   72 y.o. female with hypertension, hyperlipidemia, prediabetes, TIA, and bipolar disorder, who was found to be confused and on the floor of her apartment by her niece on the afternoon of presentation to the emergency department.  Patient was admitted to [facility] from 4/8-16 where she was felt to have an acute encephalopathy from suspected benzodiazepine overdose.  Hospital course was complicated by rhabdomyolysis, new onset atrial fibrillation, and acute on chronic diastolic congestive failure, all of which resolved.  Patient was admitted to skilled nursing facility for OT/PT.  Patient is now ready to be discharged to home.    Home health {ACTION; IS/IS [id number]} involved.   Images while in the hospital: Ct Abdomen Pelvis Wo Contrast  Result Date: 08/20/2017 CLINICAL DATA:  Anemia.  Recent fall. EXAM: CT ABDOMEN AND PELVIS WITHOUT CONTRAST TECHNIQUE: Multidetector CT imaging of the abdomen and pelvis was performed following the standard protocol without IV contrast. COMPARISON:  Lumbar spine MRI 09/29/2013. FINDINGS: Lower chest: Patchy ground-glass opacities in both lung bases with interlobular septal thickening and small pleural effusions. Decreased attenuation of the blood pool compatible with anemia. Coronary artery atherosclerosis. Hepatobiliary: No focal liver abnormality is seen. There is moderate distention of the gallbladder without gross wall thickening or acute inflammatory changes. There is no biliary dilatation. Pancreas:  Unremarkable. Spleen: Unremarkable. Adrenals/Urinary Tract: Mild bilateral adrenal gland thickening. Punctate right and possibly left renal calculi. No hydronephrosis. Unremarkable bladder. Stomach/Bowel: The stomach  is within normal limits. A moderately large amount of stool is present in the colon and rectum. There is no evidence of bowel obstruction. Vascular/Lymphatic: Abdominal aortic atherosclerosis without aneurysm. No enlarged lymph nodes. Reproductive: Status post hysterectomy. No adnexal masses. Other: No intraperitoneal free fluid.  No retroperitoneal hematoma. Musculoskeletal: Multiple old left rib fractures. Chronic L3 and L4 compression fractures. Bilateral L4 pars defects with 9 mm anterolisthesis of L4 on L5. 6 mm anterolisthesis of L3 on L4. IMPRESSION: 1. No acute abnormality identified in the abdomen or pelvis. 2. Ground-glass opacities in both lung bases. Potential etiologies include acute causes such as edema, infection, and pulmonary hemorrhage as well as chronic processes including interstitial lung disease. 3. Small pleural effusions. 4. Nonobstructing nephrolithiasis. 5. Moderately large amount of stool in the colon and rectum. No bowel obstruction. Electronically Signed   By: Logan Bores M.D.   On: 08/20/2017 17:54   Ct Head Wo Contrast  Result Date: 08/19/2017 CLINICAL DATA:  72 year old female found on floor by family. Last seen 3 days ago. Initial encounter. EXAM: CT HEAD WITHOUT CONTRAST CT CERVICAL SPINE WITHOUT CONTRAST TECHNIQUE: Multidetector CT imaging of the head and cervical spine was performed following the standard protocol without intravenous contrast. Multiplanar CT image reconstructions of the cervical spine were also generated. COMPARISON:  07/02/2017 MR. 07/01/2016 CT head and cervical spine FINDINGS: CT HEAD FINDINGS Brain: Exam is motion degraded. Resolution of previously noted left-sided chronic subdural hematoma. No new intracranial hemorrhage noted. Chronic microvascular changes without CT evidence of large acute infarct. Global atrophy. No intracranial mass lesion noted on this unenhanced exam. Vascular: Vascular calcifications Skull: No skull fracture Sinuses/Orbits: No  acute orbital abnormality. Visualized paranasal sinuses are clear. Mastoid air cells and middle ear cavities are clear. Temporomandibular joint degenerative changes. Other: Left parietal scalp hematoma. CT CERVICAL SPINE FINDINGS Alignment: Curvature cervical spine convex left. Skull base and vertebrae: No cervical spine fracture noted. Soft tissues and spinal canal: No abnormal prevertebral soft tissue swelling. Disc levels: Multilevel cervical spondylotic changes most prominent C5-6 level. Upper chest: Scarring lung apices. Other: Bilateral carotid bifurcation calcifications. IMPRESSION: Exams are motion degraded. Interval clearing of previously noted left-sided chronic subdural hematoma. No new intracranial hemorrhage noted. Chronic microvascular changes without CT evidence of large acute infarct. Atrophy. No cervical spine fracture detected. Curvature cervical spine convex left. No abnormal prevertebral soft tissue swelling. Cervical spondylotic changes similar to prior exam. Electronically Signed   By: Genia Del M.D.   On: 08/19/2017 19:47   Ct Cervical Spine Wo Contrast  Result Date: 08/19/2017 CLINICAL DATA:  72 year old female found on floor by family. Last seen 3 days ago. Initial encounter. EXAM: CT HEAD WITHOUT CONTRAST CT CERVICAL SPINE WITHOUT CONTRAST TECHNIQUE: Multidetector CT imaging of the head and cervical spine was performed following the standard protocol without intravenous contrast. Multiplanar CT image reconstructions of the cervical spine were also generated. COMPARISON:  07/02/2017 MR. 07/01/2016 CT head and cervical spine FINDINGS: CT HEAD FINDINGS Brain: Exam is motion degraded. Resolution of previously noted left-sided chronic subdural hematoma. No new intracranial hemorrhage noted. Chronic microvascular changes without CT evidence of large acute infarct. Global atrophy. No intracranial mass lesion noted on this unenhanced exam. Vascular: Vascular calcifications Skull: No skull  fracture Sinuses/Orbits: No acute orbital abnormality. Visualized paranasal sinuses are clear. Mastoid air cells and middle ear cavities are  clear. Temporomandibular joint degenerative changes. Other: Left parietal scalp hematoma. CT CERVICAL SPINE FINDINGS Alignment: Curvature cervical spine convex left. Skull base and vertebrae: No cervical spine fracture noted. Soft tissues and spinal canal: No abnormal prevertebral soft tissue swelling. Disc levels: Multilevel cervical spondylotic changes most prominent C5-6 level. Upper chest: Scarring lung apices. Other: Bilateral carotid bifurcation calcifications. IMPRESSION: Exams are motion degraded. Interval clearing of previously noted left-sided chronic subdural hematoma. No new intracranial hemorrhage noted. Chronic microvascular changes without CT evidence of large acute infarct. Atrophy. No cervical spine fracture detected. Curvature cervical spine convex left. No abnormal prevertebral soft tissue swelling. Cervical spondylotic changes similar to prior exam. Electronically Signed   By: Genia Del M.D.   On: 08/19/2017 19:47   Dg Chest Port 1 View  Result Date: 08/19/2017 CLINICAL DATA:  Found down today. EXAM: PORTABLE CHEST 1 VIEW COMPARISON:  Chest radiograph July 01, 2017 FINDINGS: Cardiac silhouette is mildly enlarged and unchanged. Calcified aortic knob. Chronic interstitial changes without pleural effusion or focal consolidation. No pneumothorax. Stable apical pleural thickening. Osteopenia. Severe LEFT shoulder osteoarthrosis, incompletely imaged. IMPRESSION: Stable cardiomegaly and chronic interstitial changes. Aortic Atherosclerosis (ICD10-I70.0). Electronically Signed   By: Elon Alas M.D.   On: 08/19/2017 18:26    Past Medical History:  Diagnosis Date  . Alcoholism ([place])   . Anxiety   . Depression   . Elevated hemoglobin A1c   . Fracture of right wrist   . GERD (gastroesophageal reflux disease)   . Heart murmur   .  Hyperlipidemia   . Hypertension   . IBS (irritable bowel syndrome)      Allergies  Allergen Reactions  . Lipitor [Atorvastatin]     Fatigue  . Prednisone Other (See Comments)    Change in mental status      Current Outpatient Medications on File Prior to Visit  Medication Sig Dispense Refill  . aspirin 325 MG tablet Take 325 mg by mouth daily.    Marland Kitchen buPROPion (WELLBUTRIN XL) 300 MG 24 hr tablet Take 1 tablet (300 mg total) by mouth daily. 30 tablet 0  . Cholecalciferol (VITAMIN D3) 5000 UNITS CAPS Take 5,000 Units by mouth daily. Only take 5 days  week    . clonazePAM (KLONOPIN) 0.25 MG disintegrating tablet Take 1 (0.25 mg) tablet by mouth at bedtime x 1 month then taper to 0.125mg  x 1 month then stop    . gabapentin (NEURONTIN) 300 MG capsule Take 300 mg by mouth at bedtime.   0  . lamoTRIgine (LAMICTAL) 200 MG tablet Take 400 mg by mouth daily.   0  . midodrine (PROAMATINE) 5 MG tablet Take 5 mg by mouth 3 (three) times daily with meals.    . potassium chloride (K-DUR) 10 MEQ tablet Take 1 tablet (10 mEq total) by mouth daily. 30 tablet 0  . rosuvastatin (CRESTOR) 20 MG tablet Take 20 mg by mouth daily.    . sertraline (ZOLOFT) 100 MG tablet Take 200 mg by mouth daily.   0  . Thiamine HCl (VITAMIN B-1) 250 MG tablet Take 250 mg by mouth 2 (two) times daily.     No current facility-administered medications on file prior to visit.     ROS: all negative except above.   Physical Exam: There were no vitals filed for this visit. There were no vitals taken for this visit. General Appearance: Well nourished, in no apparent distress. Eyes: PERRLA, EOMs, conjunctiva no swelling or erythema Sinuses: No Frontal/maxillary tenderness ENT/Mouth: Ext aud  canals clear, TMs without erythema, bulging. No erythema, swelling, or exudate on post pharynx.  Tonsils not swollen or erythematous. Hearing normal.  Neck: Supple, thyroid normal.  Respiratory: Respiratory effort normal, BS equal  bilaterally without rales, rhonchi, wheezing or stridor.  Cardio: RRR with no MRGs. Brisk peripheral pulses without edema.  Abdomen: Soft, + BS.  Non tender, no guarding, rebound, hernias, masses. Lymphatics: Non tender without lymphadenopathy.  Musculoskeletal: Full ROM, 5/5 strength, normal gait.  Skin: Warm, dry without rashes, lesions, ecchymosis.  Neuro: Cranial nerves intact. Normal muscle tone, no cerebellar symptoms. Sensation intact.  Psych: Awake and oriented X 3, normal affect, Insight and Judgment appropriate.     Izora Ribas, NP 1:30 PM [facility]

## 2017-09-20 ENCOUNTER — Encounter: Payer: Self-pay | Admitting: Cardiology

## 2017-09-20 ENCOUNTER — Ambulatory Visit: Payer: Medicare Other | Admitting: Cardiology

## 2017-09-20 VITALS — BP 128/72 | HR 59 | Ht 64.0 in | Wt 129.8 lb

## 2017-09-20 DIAGNOSIS — I480 Paroxysmal atrial fibrillation: Secondary | ICD-10-CM

## 2017-09-20 DIAGNOSIS — E559 Vitamin D deficiency, unspecified: Secondary | ICD-10-CM | POA: Diagnosis not present

## 2017-09-20 DIAGNOSIS — G300 Alzheimer's disease with early onset: Secondary | ICD-10-CM

## 2017-09-20 DIAGNOSIS — I10 Essential (primary) hypertension: Secondary | ICD-10-CM

## 2017-09-20 DIAGNOSIS — E7849 Other hyperlipidemia: Secondary | ICD-10-CM | POA: Diagnosis not present

## 2017-09-20 DIAGNOSIS — F0281 Dementia in other diseases classified elsewhere with behavioral disturbance: Secondary | ICD-10-CM | POA: Diagnosis not present

## 2017-09-20 DIAGNOSIS — R413 Other amnesia: Secondary | ICD-10-CM | POA: Diagnosis not present

## 2017-09-20 DIAGNOSIS — E876 Hypokalemia: Secondary | ICD-10-CM | POA: Diagnosis not present

## 2017-09-20 DIAGNOSIS — D518 Other vitamin B12 deficiency anemias: Secondary | ICD-10-CM | POA: Diagnosis not present
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
## 2017-09-20 NOTE — Patient Instructions (Addendum)
Medication Instructions:   Your physician recommends that you continue on your current medications as directed. Please refer to the Current Medication list given to you today.  If you need a refill on your cardiac medications before your next appointment, please call your pharmacy.  Labwork: NONE ORDERED  TODAY     Testing/Procedures: NONE ORDERED  TODAY    Follow-Up: Your physician wants you to follow-up in:  IN 6  MONTHS WITH DR NAHSER  You will receive a reminder letter in the mail two months in advance. If you don't receive a letter, please call our office to schedule the follow-up appointment.       Any Other Special Instructions Will Be Listed Below (If Applicable).                                                                                                                                                   

## 2017-09-23 ENCOUNTER — Ambulatory Visit: Payer: Self-pay | Admitting: Adult Health

## 2017-09-24 DIAGNOSIS — Z79899 Other long term (current) drug therapy: Secondary | ICD-10-CM | POA: Diagnosis not present

## 2017-09-27 ENCOUNTER — Other Ambulatory Visit: Payer: Self-pay | Admitting: Internal Medicine

## 2017-09-27 DIAGNOSIS — N63 Unspecified lump in unspecified breast: Secondary | ICD-10-CM

## 2017-10-01 DIAGNOSIS — D649 Anemia, unspecified: Secondary | ICD-10-CM | POA: Diagnosis not present

## 2017-10-01 DIAGNOSIS — D519 Vitamin B12 deficiency anemia, unspecified: Secondary | ICD-10-CM | POA: Diagnosis not present

## 2017-10-02 ENCOUNTER — Ambulatory Visit: Payer: Medicare Other | Admitting: Adult Health

## 2017-10-02 DIAGNOSIS — R269 Unspecified abnormalities of gait and mobility: Secondary | ICD-10-CM | POA: Diagnosis not present

## 2017-10-02 DIAGNOSIS — D508 Other iron deficiency anemias: Secondary | ICD-10-CM | POA: Diagnosis not present

## 2017-10-02 DIAGNOSIS — I10 Essential (primary) hypertension: Secondary | ICD-10-CM | POA: Diagnosis not present

## 2017-10-08 DIAGNOSIS — D519 Vitamin B12 deficiency anemia, unspecified: Secondary | ICD-10-CM | POA: Diagnosis not present

## 2017-10-08 DIAGNOSIS — R269 Unspecified abnormalities of gait and mobility: Secondary | ICD-10-CM | POA: Diagnosis not present

## 2017-10-08 DIAGNOSIS — D508 Other iron deficiency anemias: Secondary | ICD-10-CM | POA: Diagnosis not present

## 2017-10-08 DIAGNOSIS — I10 Essential (primary) hypertension: Secondary | ICD-10-CM | POA: Diagnosis not present

## 2017-10-10 ENCOUNTER — Ambulatory Visit
Admission: RE | Admit: 2017-10-10 | Discharge: 2017-10-10 | Disposition: A | Discharge status: Home / Self Care | Payer: Medicare Other | Source: Ambulatory Visit | Attending: Internal Medicine | Admitting: Internal Medicine

## 2017-10-10 DIAGNOSIS — N63 Unspecified lump in unspecified breast: Secondary | ICD-10-CM

## 2017-10-10 DIAGNOSIS — R928 Other abnormal and inconclusive findings on diagnostic imaging of breast: Secondary | ICD-10-CM | POA: Diagnosis not present

## 2017-10-12 ENCOUNTER — Emergency Department (HOSPITAL_COMMUNITY): Payer: Medicare Other

## 2017-10-12 ENCOUNTER — Observation Stay (HOSPITAL_COMMUNITY)
Admission: EM | Admit: 2017-10-12 | Discharge: 2017-10-16 | Disposition: A | Discharge status: Home / Self Care | Payer: Medicare Other | Attending: Internal Medicine | Admitting: Internal Medicine

## 2017-10-12 ENCOUNTER — Encounter (HOSPITAL_COMMUNITY): Payer: Self-pay | Admitting: Emergency Medicine

## 2017-10-12 DIAGNOSIS — Z7982 Long term (current) use of aspirin: Secondary | ICD-10-CM | POA: Insufficient documentation

## 2017-10-12 DIAGNOSIS — Z79899 Other long term (current) drug therapy: Secondary | ICD-10-CM | POA: Insufficient documentation

## 2017-10-12 DIAGNOSIS — Z8673 Personal history of transient ischemic attack (TIA), and cerebral infarction without residual deficits: Secondary | ICD-10-CM | POA: Insufficient documentation

## 2017-10-12 DIAGNOSIS — G300 Alzheimer's disease with early onset: Secondary | ICD-10-CM | POA: Diagnosis not present

## 2017-10-12 DIAGNOSIS — S299XXA Unspecified injury of thorax, initial encounter: Secondary | ICD-10-CM | POA: Diagnosis not present

## 2017-10-12 DIAGNOSIS — I959 Hypotension, unspecified: Secondary | ICD-10-CM | POA: Diagnosis not present

## 2017-10-12 DIAGNOSIS — J439 Emphysema, unspecified: Secondary | ICD-10-CM | POA: Diagnosis not present

## 2017-10-12 DIAGNOSIS — S0101XA Laceration without foreign body of scalp, initial encounter: Secondary | ICD-10-CM | POA: Diagnosis not present

## 2017-10-12 DIAGNOSIS — R079 Chest pain, unspecified: Secondary | ICD-10-CM | POA: Diagnosis not present

## 2017-10-12 DIAGNOSIS — E876 Hypokalemia: Secondary | ICD-10-CM | POA: Diagnosis not present

## 2017-10-12 DIAGNOSIS — F419 Anxiety disorder, unspecified: Secondary | ICD-10-CM | POA: Diagnosis not present

## 2017-10-12 DIAGNOSIS — T148XXA Other injury of unspecified body region, initial encounter: Secondary | ICD-10-CM | POA: Diagnosis not present

## 2017-10-12 DIAGNOSIS — E782 Mixed hyperlipidemia: Secondary | ICD-10-CM | POA: Diagnosis not present

## 2017-10-12 DIAGNOSIS — I700 Atherosclerosis of aorta: Secondary | ICD-10-CM | POA: Diagnosis not present

## 2017-10-12 DIAGNOSIS — D649 Anemia, unspecified: Secondary | ICD-10-CM | POA: Diagnosis not present

## 2017-10-12 DIAGNOSIS — S199XXA Unspecified injury of neck, initial encounter: Secondary | ICD-10-CM | POA: Diagnosis not present

## 2017-10-12 DIAGNOSIS — S0003XA Contusion of scalp, initial encounter: Secondary | ICD-10-CM | POA: Diagnosis not present

## 2017-10-12 DIAGNOSIS — W01198A Fall on same level from slipping, tripping and stumbling with subsequent striking against other object, initial encounter: Secondary | ICD-10-CM | POA: Diagnosis not present

## 2017-10-12 DIAGNOSIS — I480 Paroxysmal atrial fibrillation: Secondary | ICD-10-CM | POA: Insufficient documentation

## 2017-10-12 DIAGNOSIS — F0390 Unspecified dementia without behavioral disturbance: Secondary | ICD-10-CM | POA: Diagnosis present

## 2017-10-12 DIAGNOSIS — F319 Bipolar disorder, unspecified: Secondary | ICD-10-CM | POA: Diagnosis present

## 2017-10-12 DIAGNOSIS — N182 Chronic kidney disease, stage 2 (mild): Secondary | ICD-10-CM | POA: Diagnosis not present

## 2017-10-12 DIAGNOSIS — R55 Syncope and collapse: Principal | ICD-10-CM | POA: Diagnosis present

## 2017-10-12 DIAGNOSIS — Z87891 Personal history of nicotine dependence: Secondary | ICD-10-CM | POA: Insufficient documentation

## 2017-10-12 DIAGNOSIS — I130 Hypertensive heart and chronic kidney disease with heart failure and stage 1 through stage 4 chronic kidney disease, or unspecified chronic kidney disease: Secondary | ICD-10-CM | POA: Insufficient documentation

## 2017-10-12 DIAGNOSIS — I5032 Chronic diastolic (congestive) heart failure: Secondary | ICD-10-CM | POA: Diagnosis not present

## 2017-10-12 DIAGNOSIS — K219 Gastro-esophageal reflux disease without esophagitis: Secondary | ICD-10-CM | POA: Diagnosis not present

## 2017-10-12 DIAGNOSIS — S060X1A Concussion with loss of consciousness of 30 minutes or less, initial encounter: Secondary | ICD-10-CM

## 2017-10-12 DIAGNOSIS — D696 Thrombocytopenia, unspecified: Secondary | ICD-10-CM | POA: Diagnosis not present

## 2017-10-12 DIAGNOSIS — D509 Iron deficiency anemia, unspecified: Secondary | ICD-10-CM | POA: Diagnosis present

## 2017-10-12 DIAGNOSIS — S79911A Unspecified injury of right hip, initial encounter: Secondary | ICD-10-CM | POA: Diagnosis not present

## 2017-10-12 LAB — CBC WITH DIFFERENTIAL/PLATELET
ABS IMMATURE GRANULOCYTES: 0.2 10*3/uL — AB (ref 0.0–0.1)
BASOS PCT: 1 %
Basophils Absolute: 0.1 10*3/uL (ref 0.0–0.1)
Eosinophils Absolute: 0.2 10*3/uL (ref 0.0–0.7)
Eosinophils Relative: 2 %
HEMATOCRIT: 30.2 % — AB (ref 36.0–46.0)
Hemoglobin: 9.1 g/dL — ABNORMAL LOW (ref 12.0–15.0)
Immature Granulocytes: 2 %
LYMPHS ABS: 2.5 10*3/uL (ref 0.7–4.0)
Lymphocytes Relative: 21 %
MCH: 27.8 pg (ref 26.0–34.0)
MCHC: 30.1 g/dL (ref 30.0–36.0)
MCV: 92.4 fL (ref 78.0–100.0)
MONO ABS: 0.8 10*3/uL (ref 0.1–1.0)
MONOS PCT: 6 %
NEUTROS ABS: 8.4 10*3/uL — AB (ref 1.7–7.7)
Neutrophils Relative %: 68 %
PLATELETS: 192 10*3/uL (ref 150–400)
RBC: 3.27 MIL/uL — ABNORMAL LOW (ref 3.87–5.11)
RDW: 14.6 % (ref 11.5–15.5)
WBC: 12.2 10*3/uL — ABNORMAL HIGH (ref 4.0–10.5)

## 2017-10-12 LAB — ETHANOL

## 2017-10-12 LAB — I-STAT CHEM 8, ED
BUN: 30 mg/dL — AB (ref 6–20)
CALCIUM ION: 1.11 mmol/L — AB (ref 1.15–1.40)
CHLORIDE: 104 mmol/L (ref 101–111)
CREATININE: 1 mg/dL (ref 0.44–1.00)
GLUCOSE: 148 mg/dL — AB (ref 65–99)
HCT: 28 % — ABNORMAL LOW (ref 36.0–46.0)
Hemoglobin: 9.5 g/dL — ABNORMAL LOW (ref 12.0–15.0)
POTASSIUM: 5.2 mmol/L — AB (ref 3.5–5.1)
Sodium: 140 mmol/L (ref 135–145)
TCO2: 27 mmol/L (ref 22–32)

## 2017-10-12 LAB — I-STAT TROPONIN, ED: TROPONIN I, POC: 0 ng/mL (ref 0.00–0.08)

## 2017-10-12 MED ORDER — ACETAMINOPHEN 325 MG PO TABS
650.0000 mg | ORAL_TABLET | Freq: Four times a day (QID) | ORAL | Status: DC | PRN
  Administered 2017-10-14 – 2017-10-16 (×4): 650 mg via ORAL
  Filled 2017-10-12 (×4): qty 2

## 2017-10-12 MED ORDER — SODIUM CHLORIDE 0.9% FLUSH
3.0000 mL | Freq: Two times a day (BID) | INTRAVENOUS | Status: DC
  Administered 2017-10-13 – 2017-10-16 (×4): 3 mL via INTRAVENOUS

## 2017-10-12 MED ORDER — POTASSIUM CHLORIDE CRYS ER 10 MEQ PO TBCR
10.0000 meq | EXTENDED_RELEASE_TABLET | Freq: Every day | ORAL | Status: DC
  Administered 2017-10-13 – 2017-10-16 (×4): 10 meq via ORAL
  Filled 2017-10-12 (×4): qty 1

## 2017-10-12 MED ORDER — GABAPENTIN 300 MG PO CAPS
300.0000 mg | ORAL_CAPSULE | Freq: Every day | ORAL | Status: DC
  Administered 2017-10-12 – 2017-10-15 (×4): 300 mg via ORAL
  Filled 2017-10-12 (×4): qty 1

## 2017-10-12 MED ORDER — SODIUM CHLORIDE 0.9 % IV BOLUS
1000.0000 mL | Freq: Once | INTRAVENOUS | Status: AC
  Administered 2017-10-12: 1000 mL via INTRAVENOUS

## 2017-10-12 MED ORDER — LAMOTRIGINE 150 MG PO TABS
400.0000 mg | ORAL_TABLET | Freq: Every day | ORAL | Status: DC
  Administered 2017-10-13 – 2017-10-16 (×4): 400 mg via ORAL
  Filled 2017-10-12 (×4): qty 1

## 2017-10-12 MED ORDER — CLONAZEPAM 0.125 MG PO TBDP: 0.1250 mg | ORAL_TABLET | Freq: Every evening | ORAL | Status: DC | PRN

## 2017-10-12 MED ORDER — MIDODRINE HCL 5 MG PO TABS
5.0000 mg | ORAL_TABLET | Freq: Three times a day (TID) | ORAL | Status: DC
  Administered 2017-10-13 – 2017-10-14 (×5): 5 mg via ORAL
  Filled 2017-10-12 (×6): qty 1

## 2017-10-12 MED ORDER — BUPROPION HCL ER (XL) 150 MG PO TB24
300.0000 mg | ORAL_TABLET | Freq: Every day | ORAL | Status: DC
  Administered 2017-10-12 – 2017-10-16 (×5): 300 mg via ORAL
  Filled 2017-10-12 (×5): qty 2

## 2017-10-12 MED ORDER — VITAMIN B-1 100 MG PO TABS
250.0000 mg | ORAL_TABLET | Freq: Two times a day (BID) | ORAL | Status: DC
  Administered 2017-10-12 – 2017-10-16 (×8): 250 mg via ORAL
  Filled 2017-10-12 (×8): qty 3

## 2017-10-12 MED ORDER — SERTRALINE HCL 100 MG PO TABS
200.0000 mg | ORAL_TABLET | Freq: Every day | ORAL | Status: DC
  Administered 2017-10-13 – 2017-10-16 (×4): 200 mg via ORAL
  Filled 2017-10-12 (×4): qty 2

## 2017-10-12 MED ORDER — ONDANSETRON HCL 4 MG/2ML IJ SOLN
4.0000 mg | Freq: Once | INTRAMUSCULAR | Status: AC
Start: 2017-10-12 — End: 2017-10-12
  Administered 2017-10-12: 4 mg via INTRAVENOUS
  Filled 2017-10-12: qty 2

## 2017-10-12 MED ORDER — SODIUM CHLORIDE 0.9 % IV SOLN
INTRAVENOUS | Status: DC
  Administered 2017-10-12: 22:00:00 via INTRAVENOUS
  Administered 2017-10-13: 1 mL via INTRAVENOUS
  Administered 2017-10-13 – 2017-10-14 (×2): via INTRAVENOUS

## 2017-10-12 MED ORDER — ACETAMINOPHEN 650 MG RE SUPP: 650.0000 mg | Freq: Four times a day (QID) | RECTAL | Status: DC | PRN

## 2017-10-12 MED ORDER — ONDANSETRON HCL 4 MG/2ML IJ SOLN
4.0000 mg | Freq: Four times a day (QID) | INTRAMUSCULAR | Status: DC | PRN
Start: 2017-10-12 — End: 2017-10-16

## 2017-10-12 MED ORDER — ROSUVASTATIN CALCIUM 20 MG PO TABS
20.0000 mg | ORAL_TABLET | Freq: Every day | ORAL | Status: DC
  Administered 2017-10-14 – 2017-10-16 (×3): 20 mg via ORAL
  Filled 2017-10-12 (×3): qty 1

## 2017-10-12 MED ORDER — ONDANSETRON HCL 4 MG PO TABS: 4.0000 mg | ORAL_TABLET | Freq: Four times a day (QID) | ORAL | Status: DC | PRN

## 2017-10-12 NOTE — ED Provider Notes (Addendum)
[facility] Provider Note   CSN: [id number] Arrival date & time: 10/12/17  1617     History   Chief Complaint Chief Complaint  Patient presents with  . Fall    HPI Angelica Kelley is a 72 y.o. female.  Patient is a 72 year old female with a history of alcoholism, atrial fibrillation not on anticoagulation, CHF, hypokalemia and hypertension who is presenting today after falling in her bathroom and hitting her head.  Patient reported she took a shower when she got out the floor was wet and she slipped hitting her head on the floor.  She denies loss of consciousness but states she was bleeding a lot.  When EMS arrived patient was awake and alert but having significant bleeding from the head wound.  Patient denies any neck pain or numbness or weakness of her arms and legs.  Patient denies any nausea or vomiting.  However upon arrival here patient became hypotensive and lost consciousness.  She was slightly dusky around her lips at this point but there was no sign of generalized tonic-clonic movements.  This lasted approximately 2 to 3 minutes and she had resolution of her symptoms.  She is now back to baseline and coloring is improved.  Patient is currently denying chest pain, shortness of breath, palpitations, abdominal pain.  The history is provided by the patient.  Fall     Past Medical History:  Diagnosis Date  . Alcoholism ([place])   . Anxiety   . Atrial fibrillation ([place]) 09/07/2017   CHADS2Vasc of 4  Patient is not a candidate for anticoagulation at present primarily due to her inability to care for self at home and unwillingness to allow supervision (an ongong issue, APS has been involved).  If this were to change (I.e. She were to enter assisted living or have live in aide), considerations may change, and anticoagulation would be recommended.    She is alternately on ASA 32  . Depression   . Elevated hemoglobin A1c   . Fracture of right wrist     . GERD (gastroesophageal reflux disease)   . Heart murmur   . Hyperlipidemia   . Hypertension   . IBS (irritable bowel syndrome)   . TIA (transient ischemic attack) 06/18/2017    Patient Active Problem List   Diagnosis Date Noted  . Benzodiazepine overdose 09/07/2017  . Dementia 09/07/2017  . Chronic diastolic heart failure ([place]) 09/07/2017  . Chronic anemia 09/07/2017  . Hypokalemia 08/19/2017  . Depression with anxiety 05/09/2017  . Fall 05/09/2017  . Slurred speech 05/09/2017  . CKD (chronic kidney disease) stage 2, GFR 60-89 ml/min 04/23/2017  . OAB (overactive bladder) 06/09/2014  . Vitamin D deficiency 10/27/2013  . Medication management 10/27/2013  . Hypertension   . Hyperlipidemia, mixed   . GERD (gastroesophageal reflux disease)   . Bipolar depression ([place])   . Alcoholism ([place])   . IBS (irritable bowel syndrome)   . Other abnormal glucose     Past Surgical History:  Procedure Laterality Date  . ABDOMINAL HYSTERECTOMY  1991  . APPENDECTOMY    . BREAST BIOPSY Right 05/2016   fat necrosis  . BREAST SURGERY Left 1989   [facility]  . CARPAL TUNNEL RELEASE Right 10/18/2014   Procedure: CARPAL TUNNEL RELEASE;  Surgeon: Dorna Leitz, MD;  Location: [facility];  Service: Orthopedics;  Laterality: Right;  . [place]  . NECK SURGERY  Remote    Ruptured disc, per patient. Got infected.   Marland Kitchen  ORIF WRIST FRACTURE Right 10/18/2014   Procedure: OPEN REDUCTION INTERNAL FIXATION (ORIF) RIGHT WRIST FRACTURE;  Surgeon: Dorna Leitz, MD;  Location: [facility];  Service: Orthopedics;  Laterality: Right;  . TONSILLECTOMY  1965  . TUBAL LIGATION       OB History   None      Home Medications    Prior to Admission medications   Medication Sig Start Date End Date Taking? Authorizing Provider  aspirin 325 MG tablet Take 325 mg by mouth daily.    [provider]  buPROPion (WELLBUTRIN XL) 300 MG 24 hr tablet Take 1 tablet (300 mg total) by mouth daily. 08/27/17   Danford,  Suann Larry, MD  Cholecalciferol (VITAMIN D3) 5000 UNITS CAPS Take 5,000 Units by mouth daily. Only take 5 days  week    [provider]  gabapentin (NEURONTIN) 300 MG capsule Take 300 mg by mouth at bedtime.  10/26/16   [provider]  lamoTRIgine (LAMICTAL) 200 MG tablet Take 400 mg by mouth daily.  04/21/15   [provider]  midodrine (PROAMATINE) 5 MG tablet Take 5 mg by mouth 3 (three) times daily with meals.    [provider]  potassium chloride (K-DUR) 10 MEQ tablet Take 1 tablet (10 mEq total) by mouth daily. 08/27/17   Danford, Suann Larry, MD  rosuvastatin (CRESTOR) 20 MG tablet Take 20 mg by mouth daily.    [provider]  sertraline (ZOLOFT) 100 MG tablet Take 200 mg by mouth daily.  04/11/15   [provider]  Thiamine HCl (VITAMIN B-1) 250 MG tablet Take 250 mg by mouth 2 (two) times daily.    [provider]    Family History Family History  Problem Relation Age of Onset  . Heart disease Mother   . Diabetes Mother   . Leukemia Father   . Heart disease Brother   . Cancer Brother   . Parkinson's disease Brother     Social History Social History   Tobacco Use  . Smoking status: Former Smoker    Types: Cigarettes    Last attempt to quit: 05/14/2010    Years since quitting: 7.4  . Smokeless tobacco: Never Used  . Tobacco comment: second hand smoke exposure also  Substance Use Topics  . Alcohol use: Not Currently    Comment: Quit 2002, denies she ever abused it.  . Drug use: No     Allergies   Lipitor [atorvastatin] and Prednisone   Review of Systems Review of Systems  All other systems reviewed and are negative.    Physical Exam Updated Vital Signs BP (!) 115/58   Pulse 80   Temp 98.2 F (36.8 C) (Oral)   Resp 13   Ht 5\' 5"  (1.651 m)   Wt 58.5 kg (129 lb)   SpO2 99%   BMI 21.47 kg/m   Physical Exam  Constitutional: She appears well-developed and well-nourished. No distress.    HENT:  Head: Normocephalic and atraumatic.    Mouth/Throat: Oropharynx is clear and moist.  Pale conjunctive a  Eyes: Pupils are equal, round, and reactive to light. Conjunctivae and EOM are normal.  Neck: Normal range of motion. Neck supple. No spinous process tenderness and no muscular tenderness present. Normal range of motion present.  Cardiovascular: Normal rate, regular rhythm and intact distal pulses.  No murmur heard. Pulmonary/Chest: Effort normal and breath sounds normal. No respiratory distress. She has no wheezes. She has no rales.  Abdominal: Soft. She exhibits no  distension. There is no tenderness. There is no rebound and no guarding.  Musculoskeletal: Normal range of motion. She exhibits no edema or tenderness.  Neurological:  Initially was unconscious but regained consciousness and at that point was alert and oriented to person place and time.  Patient is able to move all 4 extremities with 5 out of 5 strength.  Sensation is intact in all extremities.  No gaze deficit.  Skin: Skin is warm. Capillary refill takes 2 to 3 seconds. No rash noted. She is diaphoretic. No erythema. There is pallor.  Psychiatric: She has a normal mood and affect. Her behavior is normal.  Nursing note and vitals reviewed.    ED Treatments / Results  Labs (all labs ordered are listed, but only abnormal results are displayed) Labs Reviewed  CBC WITH DIFFERENTIAL/PLATELET - Abnormal; Notable for the following components:      Result Value   WBC 12.2 (*)    RBC 3.27 (*)    Hemoglobin 9.1 (*)    HCT 30.2 (*)    Neutro Abs 8.4 (*)    Abs Immature Granulocytes 0.2 (*)    All other components within normal limits  I-STAT CHEM 8, ED - Abnormal; Notable for the following components:   Potassium 5.2 (*)    BUN 30 (*)    Glucose, Bld 148 (*)    Calcium, Ion 1.11 (*)    Hemoglobin 9.5 (*)    HCT 28.0 (*)    All other components within normal limits  ETHANOL  I-STAT TROPONIN, ED  TYPE AND  SCREEN    EKG EKG Interpretation  Date/Time:  Saturday October 12 2017 16:50:35 EDT Ventricular Rate:  78 PR Interval:    QRS Duration: 101 QT Interval:  413 QTC Calculation: 471 R Axis:   51 Text Interpretation:  Sinus rhythm Baseline wander in lead(s) V2 Normal ECG Confirmed by Blanchie Dessert [phone]) on 10/12/2017 4:59:38 PM   Radiology Ct Head Wo Contrast  Result Date: 10/12/2017 CLINICAL DATA:  72 year old who fell while getting out of the shower today, striking the back of the head. Laceration and scalp hematoma involving the RIGHT occipital region on clinical evaluation. Initial encounter. EXAM: CT HEAD WITHOUT CONTRAST CT CERVICAL SPINE WITHOUT CONTRAST TECHNIQUE: Multidetector CT imaging of the head and cervical spine was performed following the standard protocol without intravenous contrast. Multiplanar CT image reconstructions of the cervical spine were also generated. COMPARISON:  Numerous prior CT head and cervical spine examinations, most recently 08/19/2017. FINDINGS: CT HEAD FINDINGS Brain: Head tilt in the gantry accounts for apparent asymmetry in the cerebral hemispheres. Severe cerebellar atrophy, progressive since 2015. Moderate age related cortical and deep atrophy, unchanged. Mild changes of small vessel disease of the white matter, unchanged. No mass lesion. No midline shift. No acute hemorrhage or hematoma. No extra-axial fluid collections. No evidence of acute infarction. Vascular: Mild to moderate BILATERAL carotid siphon atherosclerosis and mild RIGHT vertebral artery atherosclerosis. Atretic LEFT vertebral artery. No hyperdense vessel. Skull: No skull fracture or other focal osseous abnormality involving the skull. Sinuses/Orbits: Visualized paranasal sinuses, bilateral mastoid air cells and bilateral middle ear cavities well-aerated. Visualized orbits and globes normal in appearance. Other: Large RIGHT POSTERIOR parietal scalp hematoma. CT CERVICAL SPINE FINDINGS  Alignment: Anatomic POSTERIOR alignment. Straightening of the usual cervical lordosis. Skull base and vertebrae: No fractures identified involving the cervical spine. Facet joints intact throughout with diffuse degenerative changes. Coronal reformatted images demonstrate an intact craniocervical junction, intact dens and intact lateral masses throughout.  Soft tissues and spinal canal: No evidence of paraspinous or spinal canal hematoma. No evidence of spinal stenosis. Disc levels: Mild-to-moderate disc space narrowing and endplate hypertrophic changes at C3-4 and C4-5. Severe disc space narrowing and endplate hypertrophic changes at C5-6 and C7-T1. Moderate disc space narrowing and endplate hypertrophic changes at C6-7. Combination of uncinate and facet hypertrophy account for multilevel foraminal stenoses including severe RIGHT C3-4, mild BILATERAL C5-6, mild BILATERAL C6-7. Upper chest: Emphysematous changes involving the visualized lung apices. Severe atherosclerosis involving the aortic arch. Other: BILATERAL cervical carotid atherosclerosis. IMPRESSION: CT Head: 1. No acute intracranial abnormality. 2. Large RIGHT POSTERIOR parietal scalp hematoma without evidence of underlying skull fracture. 3. Severe cerebellar atrophy. Moderate cortical and deep atrophy. Mild chronic microvascular ischemic changes of the white matter. CT Cervical Spine: 1. No fractures identified involving the cervical spine. 2. Multilevel degenerative disc disease, spondylosis and facet degenerative changes with multilevel foraminal stenoses as detailed above. 3. Aortic Atherosclerosis (ICD10-I70.0) and Emphysema (ICD10-J43.9). Electronically Signed   By: Evangeline Dakin M.D.   On: 10/12/2017 17:48   Ct Cervical Spine Wo Contrast  Result Date: 10/12/2017 CLINICAL DATA:  72 year old who fell while getting out of the shower today, striking the back of the head. Laceration and scalp hematoma involving the RIGHT occipital region on  clinical evaluation. Initial encounter. EXAM: CT HEAD WITHOUT CONTRAST CT CERVICAL SPINE WITHOUT CONTRAST TECHNIQUE: Multidetector CT imaging of the head and cervical spine was performed following the standard protocol without intravenous contrast. Multiplanar CT image reconstructions of the cervical spine were also generated. COMPARISON:  Numerous prior CT head and cervical spine examinations, most recently 08/19/2017. FINDINGS: CT HEAD FINDINGS Brain: Head tilt in the gantry accounts for apparent asymmetry in the cerebral hemispheres. Severe cerebellar atrophy, progressive since 2015. Moderate age related cortical and deep atrophy, unchanged. Mild changes of small vessel disease of the white matter, unchanged. No mass lesion. No midline shift. No acute hemorrhage or hematoma. No extra-axial fluid collections. No evidence of acute infarction. Vascular: Mild to moderate BILATERAL carotid siphon atherosclerosis and mild RIGHT vertebral artery atherosclerosis. Atretic LEFT vertebral artery. No hyperdense vessel. Skull: No skull fracture or other focal osseous abnormality involving the skull. Sinuses/Orbits: Visualized paranasal sinuses, bilateral mastoid air cells and bilateral middle ear cavities well-aerated. Visualized orbits and globes normal in appearance. Other: Large RIGHT POSTERIOR parietal scalp hematoma. CT CERVICAL SPINE FINDINGS Alignment: Anatomic POSTERIOR alignment. Straightening of the usual cervical lordosis. Skull base and vertebrae: No fractures identified involving the cervical spine. Facet joints intact throughout with diffuse degenerative changes. Coronal reformatted images demonstrate an intact craniocervical junction, intact dens and intact lateral masses throughout. Soft tissues and spinal canal: No evidence of paraspinous or spinal canal hematoma. No evidence of spinal stenosis. Disc levels: Mild-to-moderate disc space narrowing and endplate hypertrophic changes at C3-4 and C4-5. Severe disc  space narrowing and endplate hypertrophic changes at C5-6 and C7-T1. Moderate disc space narrowing and endplate hypertrophic changes at C6-7. Combination of uncinate and facet hypertrophy account for multilevel foraminal stenoses including severe RIGHT C3-4, mild BILATERAL C5-6, mild BILATERAL C6-7. Upper chest: Emphysematous changes involving the visualized lung apices. Severe atherosclerosis involving the aortic arch. Other: BILATERAL cervical carotid atherosclerosis. IMPRESSION: CT Head: 1. No acute intracranial abnormality. 2. Large RIGHT POSTERIOR parietal scalp hematoma without evidence of underlying skull fracture. 3. Severe cerebellar atrophy. Moderate cortical and deep atrophy. Mild chronic microvascular ischemic changes of the white matter. CT Cervical Spine: 1. No fractures identified involving the cervical spine. 2.  Multilevel degenerative disc disease, spondylosis and facet degenerative changes with multilevel foraminal stenoses as detailed above. 3. Aortic Atherosclerosis (ICD10-I70.0) and Emphysema (ICD10-J43.9). Electronically Signed   By: Evangeline Dakin M.D.   On: 10/12/2017 17:48   Dg Chest Port 1 View  Result Date: 10/12/2017 CLINICAL DATA:  Pain after fall EXAM: PORTABLE CHEST 1 VIEW COMPARISON:  August 22, 2017 FINDINGS: The heart size and mediastinal contours are within normal limits. Both lungs are clear. The visualized skeletal structures are unremarkable. IMPRESSION: No active disease. Electronically Signed   By: Dorise Bullion III M.D   On: 10/12/2017 17:26    Procedures Procedures (including critical care time)  Medications Ordered in ED Medications  sodium chloride 0.9 % bolus 1,000 mL (1,000 mLs Intravenous New Bag/Given 10/12/17 1647)     Initial Impression / Assessment and Plan / ED Course  I have reviewed the triage vital signs and the nursing notes.  Pertinent labs & imaging results that were available during my care of the patient were reviewed by me and considered  in my medical decision making (see chart for details).     Elderly female with multiple medical problems with a mechanical fall at home today and injury to her head.  Injury with excessive bleeding but patient denies any other injury.  Upon arrival here patient appeared to have a syncopal event.  She became hypotensive, depressed respiratory drive with some mild cyanosis and unconsciousness.  This lasted approximately 2 to 3 minutes with resolution of symptoms and return to her baseline.  Did not appear to be seizure activity.  Patient is currently in no acute distress.  Laceration on the scalp with active bleeding and 4 staples were placed for bleeding management.  Patient is not anticoagulated despite having atrial fibrillation due to having alcoholism and no help at home.  Will check labs CBC, CMP, type and screen, troponin, head CT, cervical CT and chest x-ray pending.  Patient's EKG without acute findings.  After patient woke up blood pressure improved.  She was given 1 L of fluid.  7:49 PM Patient CT is negative for intracranial injury or cervical injury.  Patient's labs are relatively stable with a hemoglobin of 9.  Patient's white count is mildly elevated but most likely reactive.  EKG x2 without acute findings and troponin is negative.  On reevaluation when attempting to sit the patient up she becomes lightheaded hot and starts vomiting with hypotension most likely from vagal laying.  Think patient most likely has a concussion from hitting her head so hard.  Feel she would benefit from admission, IV fluids and symptomatic control until she is more stable to go home.  Currently she is unable to stand due to her symptoms.  CRITICAL CARE Performed by: Meiya Wisler Total critical care time: 30 minutes Critical care time was exclusive of separately billable procedures and treating other patients. Critical care was necessary to treat or prevent imminent or life-threatening  deterioration. Critical care was time spent personally by me on the following activities: development of treatment plan with patient and/or surrogate as well as nursing, discussions with consultants, evaluation of patient's response to treatment, examination of patient, obtaining history from patient or surrogate, ordering and performing treatments and interventions, ordering and review of laboratory studies, ordering and review of radiographic studies, pulse oximetry and re-evaluation of patient's condition.   Final Clinical Impressions(s) / ED Diagnoses   Final diagnoses:  Laceration of scalp, initial encounter  Vasovagal syncope  Concussion with loss of  consciousness of 30 minutes or less, initial encounter    ED Discharge Orders    None       Blanchie Dessert, MD 10/12/17 [facility]    Blanchie Dessert, MD 10/28/17 [id number]

## 2017-10-12 NOTE — ED Notes (Addendum)
Pt stated she started feeling hot. Pt's Bps dropped into the 28O systolic. Attempted to sit pt up to drink some gingerale. Pt's Bps dropped into the 41Z systolic. Pt then started vomiting and had a syncopal episode in bed. Another fluid bolus started per MD.

## 2017-10-12 NOTE — ED Notes (Signed)
Patient transported to CT with RN 
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
## 2017-10-12 NOTE — ED Notes (Addendum)
During triage pt's Bps lowered into 80s/40s. Pt became increasingly pale and then started posturing. Pt had loss of bowel and bladder and was not responding. After about one minute pt became alert and oriented back to self. Bps increased and some of pt's color returned. MD at bedside.

## 2017-10-12 NOTE — ED Notes (Signed)
Pt called out c/o little bit of generalized cp. EKG shot. EDP aware.

## 2017-10-12 NOTE — ED Triage Notes (Signed)
Pt from [facility] assisted [facility]. Per EMS, pt had a fall when getting out of the shower. Pt unsure what she hit her head on. Pt denies neck or head pain. Pt reports rt hip pain, deformity noted. Pt takes aspirin. No LOC. Pt A&Ox4. VSS. CBG 124.

## 2017-10-12 NOTE — ED Notes (Signed)
Admitting MDGardner at beside.

## 2017-10-12 NOTE — ED Notes (Signed)
X-ray at bedside

## 2017-10-13 ENCOUNTER — Observation Stay (HOSPITAL_COMMUNITY): Payer: Medicare Other

## 2017-10-13 ENCOUNTER — Other Ambulatory Visit (HOSPITAL_COMMUNITY): Payer: Medicare Other

## 2017-10-13 ENCOUNTER — Other Ambulatory Visit: Payer: Self-pay

## 2017-10-13 DIAGNOSIS — D649 Anemia, unspecified: Secondary | ICD-10-CM

## 2017-10-13 DIAGNOSIS — R55 Syncope and collapse: Secondary | ICD-10-CM | POA: Diagnosis not present

## 2017-10-13 DIAGNOSIS — F319 Bipolar disorder, unspecified: Secondary | ICD-10-CM | POA: Diagnosis not present

## 2017-10-13 DIAGNOSIS — M25551 Pain in right hip: Secondary | ICD-10-CM | POA: Diagnosis not present

## 2017-10-13 DIAGNOSIS — S79911A Unspecified injury of right hip, initial encounter: Secondary | ICD-10-CM | POA: Diagnosis not present

## 2017-10-13 LAB — CBC
HEMATOCRIT: 21.4 % — AB (ref 36.0–46.0)
Hemoglobin: 6.7 g/dL — CL (ref 12.0–15.0)
MCH: 28.5 pg (ref 26.0–34.0)
MCHC: 31.3 g/dL (ref 30.0–36.0)
MCV: 91.1 fL (ref 78.0–100.0)
PLATELETS: 140 10*3/uL — AB (ref 150–400)
RBC: 2.35 MIL/uL — AB (ref 3.87–5.11)
RDW: 14.6 % (ref 11.5–15.5)
WBC: 8.8 10*3/uL (ref 4.0–10.5)

## 2017-10-13 LAB — CBC WITH DIFFERENTIAL/PLATELET
ABS IMMATURE GRANULOCYTES: 0 10*3/uL (ref 0.0–0.1)
BASOS ABS: 0 10*3/uL (ref 0.0–0.1)
Basophils Relative: 0 %
Eosinophils Absolute: 0.1 10*3/uL (ref 0.0–0.7)
Eosinophils Relative: 1 %
HCT: 22.6 % — ABNORMAL LOW (ref 36.0–46.0)
HEMOGLOBIN: 6.9 g/dL — AB (ref 12.0–15.0)
Immature Granulocytes: 1 %
Lymphocytes Relative: 15 %
Lymphs Abs: 1.2 10*3/uL (ref 0.7–4.0)
MCH: 28.2 pg (ref 26.0–34.0)
MCHC: 30.5 g/dL (ref 30.0–36.0)
MCV: 92.2 fL (ref 78.0–100.0)
MONO ABS: 0.6 10*3/uL (ref 0.1–1.0)
Monocytes Relative: 7 %
NEUTROS ABS: 6.5 10*3/uL (ref 1.7–7.7)
Neutrophils Relative %: 76 %
PLATELETS: 143 10*3/uL — AB (ref 150–400)
RBC: 2.45 MIL/uL — ABNORMAL LOW (ref 3.87–5.11)
RDW: 14.5 % (ref 11.5–15.5)
WBC: 8.4 10*3/uL (ref 4.0–10.5)

## 2017-10-13 LAB — BASIC METABOLIC PANEL
Anion gap: 5 (ref 5–15)
BUN: 17 mg/dL (ref 6–20)
CHLORIDE: 111 mmol/L (ref 101–111)
CO2: 26 mmol/L (ref 22–32)
CREATININE: 0.84 mg/dL (ref 0.44–1.00)
Calcium: 8.1 mg/dL — ABNORMAL LOW (ref 8.9–10.3)
GFR calc non Af Amer: >60 mL/min (ref >60)
Glucose, Bld: 110 mg/dL — ABNORMAL HIGH (ref 65–99)
POTASSIUM: 4.7 mmol/L (ref 3.5–5.1)
SODIUM: 142 mmol/L (ref 135–145)

## 2017-10-13 LAB — HEMOGLOBIN AND HEMATOCRIT, BLOOD
HEMATOCRIT: 28.6 % — AB (ref 36.0–46.0)
Hemoglobin: 9.1 g/dL — ABNORMAL LOW (ref 12.0–15.0)

## 2017-10-13 LAB — PREPARE RBC (CROSSMATCH)

## 2017-10-13 MED ORDER — SODIUM CHLORIDE 0.9 % IV SOLN
Freq: Once | INTRAVENOUS | Status: AC
Start: 2017-10-13 — End: 2017-10-13
  Administered 2017-10-13: 09:00:00 via INTRAVENOUS

## 2017-10-13 NOTE — ED Notes (Signed)
Breakfast at bedside.

## 2017-10-13 NOTE — ED Notes (Signed)
Patient complaint of some R sided hip pain. Patient does have silver dollar sized bruise to lateral and posterior aspect of R hip/buttocks/thigh. Paged Dr. Jonelle Sidle, verbal order for portable hip x-ray.

## 2017-10-13 NOTE — ED Notes (Signed)
MD at bedside. To order MRI. Awaiting CBC redraw result. Per MD, if CBC comes back low, start transfusing blood product. Becky,RN aware.

## 2017-10-13 NOTE — ED Notes (Signed)
Paged attending MD regarding MRI and blood transfusion timing. Per MD ok to proceed with 2 units of blood prior to going to MRI. Will notify MRI of plan.

## 2017-10-13 NOTE — ED Notes (Signed)
Breakfast tray ordered 

## 2017-10-13 NOTE — Progress Notes (Signed)
PT Cancellation Note  Patient Details Name: Angelica Kelley MRN: [id number] DOB: 05/06/1946   Cancelled Treatment:    Reason Eval/Treat Not Completed: Patient at procedure or test/unavailable. Orders received for PT eval. Pt with hgb 6.9 this AM with plan for transfusion. Pt now in MRI. PT to re-attempt eval tomorrow.    Lorriane Shire 10/13/2017, 2:00 PM   Lorrin Goodell, PT  Office # [phone] Pager [phone]

## 2017-10-13 NOTE — ED Notes (Signed)
Patient denies pain and is resting comfortably.  

## 2017-10-13 NOTE — Progress Notes (Signed)
Patient admitted to the unit via stretcher from the ED. Telemetry applied. Skin assessment completed. Orders reviewed. Patient oriented to the room. Call bell in place and bed alarm on. Will monitor.  Sheliah Plane RN

## 2017-10-13 NOTE — ED Notes (Signed)
Paged md for critical lab result.

## 2017-10-13 NOTE — Progress Notes (Signed)
Patient ID: Angelica Kelley, female   DOB: [birth year], 72 y.o.   MRN: [id number]  PROGRESS NOTE    Angelica Kelley  WUJ:[id number] DOB: 09-24-1945 DOA: 10/12/2017   PCP: Unk Pinto, MD   Outpatient Specialists: Dr Tish Frederickson, Neurology   Brief Narrative:   Angelica Kelley is a 72 y.o. female with medical history significant of BPD, prior EtOH abuse in past, PAF, HTN, dementia. Patient slipped, fell, hit head in shower.  Not believed to have had syncope at that time but not sure.  No N/V at that time. Patient brought in to ED.  In triage patient had syncope associated with BP drop, became pale, hypotension, started "posturing" had loss of bowel and bladder, bit tongue.   Then had another episode of syncope an hour or two later: Pt stated she started feeling hot. Pt's Bps dropped into the 95A systolic. Attempted to sit pt up to drink some gingerale. Pt's Bps dropped into the 21H systolic. Pt then started vomiting and had a syncopal episode in bed. Another fluid bolus started per MD.  Patient became bradycardic during 2nd episode (s.brady) according to EDP. She was later found to have significant drop in her hemoglobin with no obvious source.      Assessment & Plan:   Principal Problem:   Syncope Active Problems:   Bipolar depression (HCC)   Dementia anemia  #1 syncope: Probably secondary to dehydration with possible blood loss. Patient's hemoglobin dropped to 6.7. No obvious source of bleed. She has pain in the left upper thigh and mild bruising. We will x-ray the area and possibly CT scan to see if she has deep hematoma but less likely. Blood pressure in the 80 systolic when she came in which will explain possible hypotension as a cause. She will be hydrated aggressively. Check MRI of the brain.  #2 anemia: No source of bleeding. We will transfuse 2 units of packed red blood cells and follow H&H in the morning  #3 dementia: Stable at her baseline.  #4 GERD: Continue his PPIs  #5  hyperlipidemia: Continue statin  #6 bipolar disorder: Continue with Zoloft and Wellbutrin   DVT prophylaxis: SCD Code Status: Full Family Communication: None available. Disposition Plan: back to SNF   Consultants:   None  Procedures:   MRI,x-ray of the hip is pending   Antimicrobials:    None    Subjective: Patient is doing much better now. She has recovered from her previous episode. No fever or chills no nausea vomiting or diarrhea.  Objective: Vitals:   10/13/17 0500 10/13/17 0515 10/13/17 0530 10/13/17 0630  BP: (!) 106/50 (!) 109/57 103/63   Pulse: 73 76 78 74  Resp: 18 18 18 16   Temp:      TempSrc:      SpO2: 92% 92% 93% 98%  Weight:      Height:        Intake/Output Summary (Last 24 hours) at 10/13/2017 0727 Last data filed at 10/13/2017 0615 Gross per 24 hour  Intake 1800 ml  Output -  Net 1800 ml   Filed Weights   10/12/17 1641  Weight: 58.5 kg (129 lb)    Examination:  General exam: Appears calm and comfortable  Respiratory system: Clear to auscultation. Respiratory effort normal. Cardiovascular system: S1 & S2 heard, RRR. No JVD, murmurs, rubs, gallops or clicks. No pedal edema. Gastrointestinal system: Abdomen is nondistended, soft and nontender. No organomegaly or masses felt. Normal bowel sounds heard. Central nervous system: Alert  and oriented. No focal neurological deficits. Extremities: Symmetric 5 x 5 power. Skin: No rashes, lesions or ulcers Psychiatry: Judgement and insight appear normal. Mood & affect appropriate.     Data Reviewed: I have personally reviewed following labs and imaging studies  CBC: Recent Labs  Lab 10/12/17 1643 10/12/17 1657 10/13/17 0540  WBC 12.2*  --  8.8  NEUTROABS 8.4*  --   --   HGB 9.1* 9.5* 6.7*  HCT 30.2* 28.0* 21.4*  MCV 92.4  --  91.1  PLT 192  --  035*   Basic Metabolic Panel: Recent Labs  Lab 10/12/17 1657 10/13/17 0540  NA 140 142  K 5.2* 4.7  CL 104 111  CO2  --  26  GLUCOSE  148* 110*  BUN 30* 17  CREATININE 1.00 0.84  CALCIUM  --  8.1*   GFR: Estimated Creatinine Clearance: 52.9 mL/min (by C-G formula based on SCr of 0.84 mg/dL). Liver Function Tests: No results for input(s): AST, ALT, ALKPHOS, BILITOT, PROT, ALBUMIN in the last 168 hours. No results for input(s): LIPASE, AMYLASE in the last 168 hours. No results for input(s): AMMONIA in the last 168 hours. Coagulation Profile: No results for input(s): INR, PROTIME in the last 168 hours. Cardiac Enzymes: No results for input(s): CKTOTAL, CKMB, CKMBINDEX, TROPONINI in the last 168 hours. BNP (last 3 results) No results for input(s): PROBNP in the last 8760 hours. HbA1C: No results for input(s): HGBA1C in the last 72 hours. CBG: No results for input(s): GLUCAP in the last 168 hours. Lipid Profile: No results for input(s): CHOL, HDL, LDLCALC, TRIG, CHOLHDL, LDLDIRECT in the last 72 hours. Thyroid Function Tests: No results for input(s): TSH, T4TOTAL, FREET4, T3FREE, THYROIDAB in the last 72 hours. Anemia Panel: No results for input(s): VITAMINB12, FOLATE, FERRITIN, TIBC, IRON, RETICCTPCT in the last 72 hours. Urine analysis:    Component Value Date/Time   COLORURINE AMBER (A) 08/19/2017 1835   APPEARANCEUR CLOUDY (A) 08/19/2017 1835   LABSPEC 1.023 08/19/2017 1835   PHURINE 5.0 08/19/2017 1835   GLUCOSEU NEGATIVE 08/19/2017 1835   HGBUR LARGE (A) 08/19/2017 [place] NEGATIVE 08/19/2017 [place] 08/19/2017 1835   PROTEINUR 30 (A) 08/19/2017 1835   UROBILINOGEN 1 06/09/2014 1531   NITRITE NEGATIVE 08/19/2017 [place] 08/19/2017 1835   Sepsis Labs: @LABRCNTIP (procalcitonin:4,lacticidven:4)  )No results found for this or any previous visit (from the past 240 hour(s)).       Radiology Studies: Ct Head Wo Contrast  Result Date: 10/12/2017 CLINICAL DATA:  72 year old who fell while getting out of the shower today, striking the back of the head.  Laceration and scalp hematoma involving the RIGHT occipital region on clinical evaluation. Initial encounter. EXAM: CT HEAD WITHOUT CONTRAST CT CERVICAL SPINE WITHOUT CONTRAST TECHNIQUE: Multidetector CT imaging of the head and cervical spine was performed following the standard protocol without intravenous contrast. Multiplanar CT image reconstructions of the cervical spine were also generated. COMPARISON:  Numerous prior CT head and cervical spine examinations, most recently 08/19/2017. FINDINGS: CT HEAD FINDINGS Brain: Head tilt in the gantry accounts for apparent asymmetry in the cerebral hemispheres. Severe cerebellar atrophy, progressive since 2015. Moderate age related cortical and deep atrophy, unchanged. Mild changes of small vessel disease of the white matter, unchanged. No mass lesion. No midline shift. No acute hemorrhage or hematoma. No extra-axial fluid collections. No evidence of acute infarction. Vascular: Mild to moderate BILATERAL carotid siphon atherosclerosis and mild RIGHT vertebral artery atherosclerosis.  Atretic LEFT vertebral artery. No hyperdense vessel. Skull: No skull fracture or other focal osseous abnormality involving the skull. Sinuses/Orbits: Visualized paranasal sinuses, bilateral mastoid air cells and bilateral middle ear cavities well-aerated. Visualized orbits and globes normal in appearance. Other: Large RIGHT POSTERIOR parietal scalp hematoma. CT CERVICAL SPINE FINDINGS Alignment: Anatomic POSTERIOR alignment. Straightening of the usual cervical lordosis. Skull base and vertebrae: No fractures identified involving the cervical spine. Facet joints intact throughout with diffuse degenerative changes. Coronal reformatted images demonstrate an intact craniocervical junction, intact dens and intact lateral masses throughout. Soft tissues and spinal canal: No evidence of paraspinous or spinal canal hematoma. No evidence of spinal stenosis. Disc levels: Mild-to-moderate disc space  narrowing and endplate hypertrophic changes at C3-4 and C4-5. Severe disc space narrowing and endplate hypertrophic changes at C5-6 and C7-T1. Moderate disc space narrowing and endplate hypertrophic changes at C6-7. Combination of uncinate and facet hypertrophy account for multilevel foraminal stenoses including severe RIGHT C3-4, mild BILATERAL C5-6, mild BILATERAL C6-7. Upper chest: Emphysematous changes involving the visualized lung apices. Severe atherosclerosis involving the aortic arch. Other: BILATERAL cervical carotid atherosclerosis. IMPRESSION: CT Head: 1. No acute intracranial abnormality. 2. Large RIGHT POSTERIOR parietal scalp hematoma without evidence of underlying skull fracture. 3. Severe cerebellar atrophy. Moderate cortical and deep atrophy. Mild chronic microvascular ischemic changes of the white matter. CT Cervical Spine: 1. No fractures identified involving the cervical spine. 2. Multilevel degenerative disc disease, spondylosis and facet degenerative changes with multilevel foraminal stenoses as detailed above. 3. Aortic Atherosclerosis (ICD10-I70.0) and Emphysema (ICD10-J43.9). Electronically Signed   By: Evangeline Dakin M.D.   On: 10/12/2017 17:48   Ct Cervical Spine Wo Contrast  Result Date: 10/12/2017 CLINICAL DATA:  72 year old who fell while getting out of the shower today, striking the back of the head. Laceration and scalp hematoma involving the RIGHT occipital region on clinical evaluation. Initial encounter. EXAM: CT HEAD WITHOUT CONTRAST CT CERVICAL SPINE WITHOUT CONTRAST TECHNIQUE: Multidetector CT imaging of the head and cervical spine was performed following the standard protocol without intravenous contrast. Multiplanar CT image reconstructions of the cervical spine were also generated. COMPARISON:  Numerous prior CT head and cervical spine examinations, most recently 08/19/2017. FINDINGS: CT HEAD FINDINGS Brain: Head tilt in the gantry accounts for apparent asymmetry in the  cerebral hemispheres. Severe cerebellar atrophy, progressive since 2015. Moderate age related cortical and deep atrophy, unchanged. Mild changes of small vessel disease of the white matter, unchanged. No mass lesion. No midline shift. No acute hemorrhage or hematoma. No extra-axial fluid collections. No evidence of acute infarction. Vascular: Mild to moderate BILATERAL carotid siphon atherosclerosis and mild RIGHT vertebral artery atherosclerosis. Atretic LEFT vertebral artery. No hyperdense vessel. Skull: No skull fracture or other focal osseous abnormality involving the skull. Sinuses/Orbits: Visualized paranasal sinuses, bilateral mastoid air cells and bilateral middle ear cavities well-aerated. Visualized orbits and globes normal in appearance. Other: Large RIGHT POSTERIOR parietal scalp hematoma. CT CERVICAL SPINE FINDINGS Alignment: Anatomic POSTERIOR alignment. Straightening of the usual cervical lordosis. Skull base and vertebrae: No fractures identified involving the cervical spine. Facet joints intact throughout with diffuse degenerative changes. Coronal reformatted images demonstrate an intact craniocervical junction, intact dens and intact lateral masses throughout. Soft tissues and spinal canal: No evidence of paraspinous or spinal canal hematoma. No evidence of spinal stenosis. Disc levels: Mild-to-moderate disc space narrowing and endplate hypertrophic changes at C3-4 and C4-5. Severe disc space narrowing and endplate hypertrophic changes at C5-6 and C7-T1. Moderate disc space narrowing and endplate hypertrophic  changes at C6-7. Combination of uncinate and facet hypertrophy account for multilevel foraminal stenoses including severe RIGHT C3-4, mild BILATERAL C5-6, mild BILATERAL C6-7. Upper chest: Emphysematous changes involving the visualized lung apices. Severe atherosclerosis involving the aortic arch. Other: BILATERAL cervical carotid atherosclerosis. IMPRESSION: CT Head: 1. No acute intracranial  abnormality. 2. Large RIGHT POSTERIOR parietal scalp hematoma without evidence of underlying skull fracture. 3. Severe cerebellar atrophy. Moderate cortical and deep atrophy. Mild chronic microvascular ischemic changes of the white matter. CT Cervical Spine: 1. No fractures identified involving the cervical spine. 2. Multilevel degenerative disc disease, spondylosis and facet degenerative changes with multilevel foraminal stenoses as detailed above. 3. Aortic Atherosclerosis (ICD10-I70.0) and Emphysema (ICD10-J43.9). Electronically Signed   By: Evangeline Dakin M.D.   On: 10/12/2017 17:48   Dg Chest Port 1 View  Result Date: 10/12/2017 CLINICAL DATA:  Pain after fall EXAM: PORTABLE CHEST 1 VIEW COMPARISON:  August 22, 2017 FINDINGS: The heart size and mediastinal contours are within normal limits. Both lungs are clear. The visualized skeletal structures are unremarkable. IMPRESSION: No active disease. Electronically Signed   By: Dorise Bullion III M.D   On: 10/12/2017 17:26        Scheduled Meds: . buPROPion  300 mg Oral Daily  . gabapentin  300 mg Oral QHS  . lamoTRIgine  400 mg Oral Daily  . midodrine  5 mg Oral TID WC  . potassium chloride  10 mEq Oral Daily  . rosuvastatin  20 mg Oral Daily  . sertraline  200 mg Oral Daily  . sodium chloride flush  3 mL Intravenous Q12H  . vitamin B-1  250 mg Oral BID   Continuous Infusions: . sodium chloride 1 mL (10/13/17 0615)  . sodium chloride       LOS: 0 days    Time spent: 36 minutes    Koraima Albertsen,LAWAL, MD [facility] Pager [phone]  If 7PM-7AM, please contact night-coverage [email or web address] Password [facility] 10/13/2017, 7:27 AM

## 2017-10-13 NOTE — ED Notes (Signed)
MRI called and ready for patient. Ask if we can wait until second cbc result is back. Will call MRI when pt is able to come over.

## 2017-10-13 NOTE — ED Notes (Addendum)
Spoke with Dr. Alcario Drought about critical lab of hgb 6.7. MD wants a redraw cbc. Pt has no complaints. VSS.

## 2017-10-13 NOTE — ED Notes (Signed)
Attempted to draw CBC but was unsuccessful.

## 2017-10-14 ENCOUNTER — Observation Stay (HOSPITAL_BASED_OUTPATIENT_CLINIC_OR_DEPARTMENT_OTHER): Payer: Medicare Other

## 2017-10-14 DIAGNOSIS — R55 Syncope and collapse: Secondary | ICD-10-CM

## 2017-10-14 DIAGNOSIS — I509 Heart failure, unspecified: Secondary | ICD-10-CM | POA: Diagnosis not present

## 2017-10-14 DIAGNOSIS — G300 Alzheimer's disease with early onset: Secondary | ICD-10-CM

## 2017-10-14 DIAGNOSIS — S060X1A Concussion with loss of consciousness of 30 minutes or less, initial encounter: Secondary | ICD-10-CM

## 2017-10-14 DIAGNOSIS — F0281 Dementia in other diseases classified elsewhere with behavioral disturbance: Secondary | ICD-10-CM

## 2017-10-14 DIAGNOSIS — S0101XA Laceration without foreign body of scalp, initial encounter: Secondary | ICD-10-CM | POA: Diagnosis not present

## 2017-10-14 LAB — BPAM RBC
BLOOD PRODUCT EXPIRATION DATE: 201906112359
BLOOD PRODUCT EXPIRATION DATE: 201906132359
ISSUE DATE / TIME: 201906021119
ISSUE DATE / TIME: 201906020838
UNIT TYPE AND RH: 6200
Unit Type and Rh: 6200

## 2017-10-14 LAB — CBC WITH DIFFERENTIAL/PLATELET
ABS IMMATURE GRANULOCYTES: 0.1 10*3/uL (ref 0.0–0.1)
BASOS ABS: 0.1 10*3/uL (ref 0.0–0.1)
Basophils Relative: 1 %
EOS PCT: 4 %
Eosinophils Absolute: 0.3 10*3/uL (ref 0.0–0.7)
HEMATOCRIT: 26.6 % — AB (ref 36.0–46.0)
HEMOGLOBIN: 8.6 g/dL — AB (ref 12.0–15.0)
IMMATURE GRANULOCYTES: 1 %
LYMPHS ABS: 1.2 10*3/uL (ref 0.7–4.0)
LYMPHS PCT: 18 %
MCH: 28.5 pg (ref 26.0–34.0)
MCHC: 32.3 g/dL (ref 30.0–36.0)
MCV: 88.1 fL (ref 78.0–100.0)
Monocytes Absolute: 0.5 10*3/uL (ref 0.1–1.0)
Monocytes Relative: 8 %
NEUTROS ABS: 4.6 10*3/uL (ref 1.7–7.7)
Neutrophils Relative %: 68 %
Platelets: 132 10*3/uL — ABNORMAL LOW (ref 150–400)
RBC: 3.02 MIL/uL — AB (ref 3.87–5.11)
RDW: 14.7 % (ref 11.5–15.5)
WBC: 6.7 10*3/uL (ref 4.0–10.5)

## 2017-10-14 LAB — ECHOCARDIOGRAM COMPLETE
Height: 65 in
Weight: 2179.91 oz

## 2017-10-14 LAB — TYPE AND SCREEN
ABO/RH(D): A POS
ANTIBODY SCREEN: NEGATIVE
UNIT DIVISION: 0
Unit division: 0

## 2017-10-14 LAB — BASIC METABOLIC PANEL
ANION GAP: 4 — AB (ref 5–15)
BUN: 8 mg/dL (ref 6–20)
CHLORIDE: 114 mmol/L — AB (ref 101–111)
CO2: 26 mmol/L (ref 22–32)
CREATININE: 0.76 mg/dL (ref 0.44–1.00)
Calcium: 8.3 mg/dL — ABNORMAL LOW (ref 8.9–10.3)
GFR calc non Af Amer: >60 mL/min (ref >60)
Glucose, Bld: 102 mg/dL — ABNORMAL HIGH (ref 65–99)
POTASSIUM: 3.6 mmol/L (ref 3.5–5.1)
SODIUM: 144 mmol/L (ref 135–145)

## 2017-10-14 LAB — MAGNESIUM: MAGNESIUM: 1.7 mg/dL (ref 1.7–2.4)

## 2017-10-14 LAB — GLUCOSE, CAPILLARY: GLUCOSE-CAPILLARY: 93 mg/dL (ref 65–99)

## 2017-10-14 MED ORDER — MIDODRINE HCL 5 MG PO TABS
5.0000 mg | ORAL_TABLET | Freq: Three times a day (TID) | ORAL | Status: DC
  Administered 2017-10-14 – 2017-10-16 (×7): 5 mg via ORAL
  Filled 2017-10-14 (×7): qty 1

## 2017-10-14 NOTE — Care Management Obs Status (Signed)
[place] NOTIFICATION   Patient Details  Name: Angelica Kelley MRN: [id number] Date of Birth: 02-23-1946   Medicare Observation Status Notification Given:  Yes    Erenest Rasher, RN 10/14/2017, 8:53 AM

## 2017-10-14 NOTE — Progress Notes (Signed)
Progress Note    Angelica Kelley  YWV:[id number] DOB: 1945/09/06  DOA: 10/12/2017 PCP: Unk Pinto, MD    Brief Narrative:     Medical records reviewed and are as summarized below:  Angelica Kelley is an 72 y.o. female with medical history significant ofBPD, prior EtOH abuse in past, PAF, HTN, dementia. Patient slipped, fell, hit head in shower. Not believed to have had syncope at that time but not sure. No N/V at that time. Patient brought in to ED. In triage patient had syncope associated with BP drop, became pale, hypotension, started "posturing" had loss of bowel and bladder, bit tongue.   Then had another episode of syncope an hour or two later:Pt stated she started feeling hot. Pt's Bps dropped into the 85I systolic. Attempted to sit pt up to drink some gingerale. Pt's Bps dropped into the 62V systolic. Pt then started vomiting and had a syncopal episode in bed. Another fluid bolus started per MD.  Patient became bradycardic during 2nd episode (s.brady) according to EDP. She was later found to have significant drop in her hemoglobin with no obvious source.    Assessment/Plan:   Principal Problem:   Syncope Active Problems:   Bipolar depression (HCC)   Dementia   Chronic anemia  Syncope vs fall -patient reports slipping and falling -subsequently in the ER, had 2 syncopal episodes -s/p IVF and blood -recheck orthostatics -echo pending -will need event monitor as well-- other episodes in the past  Concussion -not sure how progressive patient's dementia is-- here her short term memory is nonexistent -chart review from 3/27 shows A+Ox3 in PCPs office  Anemia:  -s/p 2 units PRBC -?ABLA-- had posterior head lac  Dementia: -? baseline.  GERD:  - PPIs  hyperlipidemia: Continue statin  bipolar disorder: Continue with Zoloft and Wellbutrin -monitor Qtc   Body mass index is 22.67 kg/m.   Family Communication/Anticipated D/C date and plan/Code  Status   DVT prophylaxis: scd Code Status: Full Code.  Family Communication: none at bedside Disposition Plan: SNF   Medical Consultants:    None.   Subjective:   Thinks she slipped getting out of the shower  Objective:    Vitals:   10/13/17 1527 10/13/17 2101 10/14/17 0456 10/14/17 0729  BP: 128/68 93/75 116/62 132/70  Pulse: 74 73 75 75  Resp: 16 14 16 18   Temp: 99.1 F (37.3 C) 98.8 F (37.1 C) 98.3 F (36.8 C) 99.1 F (37.3 C)  TempSrc: Oral Oral  Oral  SpO2: 98% 98% 98% 94%  Weight: 61.8 kg (136 lb 3.9 oz) 61.8 kg (136 lb 3.9 oz)    Height:        Intake/Output Summary (Last 24 hours) at 10/14/2017 1438 Last data filed at 10/14/2017 0601 Gross per 24 hour  Intake 2200 ml  Output 725 ml  Net 1475 ml   Filed Weights   10/12/17 1641 10/13/17 1527 10/13/17 2101  Weight: 58.5 kg (129 lb) 61.8 kg (136 lb 3.9 oz) 61.8 kg (136 lb 3.9 oz)    Exam: Nad, in chair eating breakfast Patient's short term memory impaired-- unable to recall name given to her after 2 minutes rrr No increased work of breathing Few areas of bruising   Data Reviewed:   I have personally reviewed following labs and imaging studies:  Labs: Labs show the following:   Basic Metabolic Panel: Recent Labs  Lab 10/12/17 1657 10/13/17 0540 10/14/17 0639  NA 140 142 144  K 5.2*  4.7 3.6  CL 104 111 114*  CO2  --  26 26  GLUCOSE 148* 110* 102*  BUN 30* 17 8  CREATININE 1.00 0.84 0.76  CALCIUM  --  8.1* 8.3*   GFR Estimated Creatinine Clearance: 55.5 mL/min (by C-G formula based on SCr of 0.76 mg/dL). Liver Function Tests: No results for input(s): AST, ALT, ALKPHOS, BILITOT, PROT, ALBUMIN in the last 168 hours. No results for input(s): LIPASE, AMYLASE in the last 168 hours. No results for input(s): AMMONIA in the last 168 hours. Coagulation profile No results for input(s): INR, PROTIME in the last 168 hours.  CBC: Recent Labs  Lab 10/12/17 1643 10/12/17 1657 10/13/17 0540  10/13/17 0716 10/13/17 1542 10/14/17 0639  WBC 12.2*  --  8.8 8.4  --  6.7  NEUTROABS 8.4*  --   --  6.5  --  4.6  HGB 9.1* 9.5* 6.7* 6.9* 9.1* 8.6*  HCT 30.2* 28.0* 21.4* 22.6* 28.6* 26.6*  MCV 92.4  --  91.1 92.2  --  88.1  PLT 192  --  140* 143*  --  132*   Cardiac Enzymes: No results for input(s): CKTOTAL, CKMB, CKMBINDEX, TROPONINI in the last 168 hours. BNP (last 3 results) No results for input(s): PROBNP in the last 8760 hours. CBG: Recent Labs  Lab 10/14/17 0516  GLUCAP 93   D-Dimer: No results for input(s): DDIMER in the last 72 hours. Hgb A1c: No results for input(s): HGBA1C in the last 72 hours. Lipid Profile: No results for input(s): CHOL, HDL, LDLCALC, TRIG, CHOLHDL, LDLDIRECT in the last 72 hours. Thyroid function studies: No results for input(s): TSH, T4TOTAL, T3FREE, THYROIDAB in the last 72 hours.  Invalid input(s): FREET3 Anemia work up: No results for input(s): VITAMINB12, FOLATE, FERRITIN, TIBC, IRON, RETICCTPCT in the last 72 hours. Sepsis Labs: Recent Labs  Lab 10/12/17 1643 10/13/17 0540 10/13/17 0716 10/14/17 0639  WBC 12.2* 8.8 8.4 6.7    Microbiology No results found for this or any previous visit (from the past 240 hour(s)).  Procedures and diagnostic studies:  Ct Head Wo Contrast  Result Date: 10/12/2017 CLINICAL DATA:  72 year old who fell while getting out of the shower today, striking the back of the head. Laceration and scalp hematoma involving the RIGHT occipital region on clinical evaluation. Initial encounter. EXAM: CT HEAD WITHOUT CONTRAST CT CERVICAL SPINE WITHOUT CONTRAST TECHNIQUE: Multidetector CT imaging of the head and cervical spine was performed following the standard protocol without intravenous contrast. Multiplanar CT image reconstructions of the cervical spine were also generated. COMPARISON:  Numerous prior CT head and cervical spine examinations, most recently 08/19/2017. FINDINGS: CT HEAD FINDINGS Brain: Head tilt in  the gantry accounts for apparent asymmetry in the cerebral hemispheres. Severe cerebellar atrophy, progressive since 2015. Moderate age related cortical and deep atrophy, unchanged. Mild changes of small vessel disease of the white matter, unchanged. No mass lesion. No midline shift. No acute hemorrhage or hematoma. No extra-axial fluid collections. No evidence of acute infarction. Vascular: Mild to moderate BILATERAL carotid siphon atherosclerosis and mild RIGHT vertebral artery atherosclerosis. Atretic LEFT vertebral artery. No hyperdense vessel. Skull: No skull fracture or other focal osseous abnormality involving the skull. Sinuses/Orbits: Visualized paranasal sinuses, bilateral mastoid air cells and bilateral middle ear cavities well-aerated. Visualized orbits and globes normal in appearance. Other: Large RIGHT POSTERIOR parietal scalp hematoma. CT CERVICAL SPINE FINDINGS Alignment: Anatomic POSTERIOR alignment. Straightening of the usual cervical lordosis. Skull base and vertebrae: No fractures identified involving the cervical spine. Facet  joints intact throughout with diffuse degenerative changes. Coronal reformatted images demonstrate an intact craniocervical junction, intact dens and intact lateral masses throughout. Soft tissues and spinal canal: No evidence of paraspinous or spinal canal hematoma. No evidence of spinal stenosis. Disc levels: Mild-to-moderate disc space narrowing and endplate hypertrophic changes at C3-4 and C4-5. Severe disc space narrowing and endplate hypertrophic changes at C5-6 and C7-T1. Moderate disc space narrowing and endplate hypertrophic changes at C6-7. Combination of uncinate and facet hypertrophy account for multilevel foraminal stenoses including severe RIGHT C3-4, mild BILATERAL C5-6, mild BILATERAL C6-7. Upper chest: Emphysematous changes involving the visualized lung apices. Severe atherosclerosis involving the aortic arch. Other: BILATERAL cervical carotid  atherosclerosis. IMPRESSION: CT Head: 1. No acute intracranial abnormality. 2. Large RIGHT POSTERIOR parietal scalp hematoma without evidence of underlying skull fracture. 3. Severe cerebellar atrophy. Moderate cortical and deep atrophy. Mild chronic microvascular ischemic changes of the white matter. CT Cervical Spine: 1. No fractures identified involving the cervical spine. 2. Multilevel degenerative disc disease, spondylosis and facet degenerative changes with multilevel foraminal stenoses as detailed above. 3. Aortic Atherosclerosis (ICD10-I70.0) and Emphysema (ICD10-J43.9). Electronically Signed   By: Evangeline Dakin M.D.   On: 10/12/2017 17:48   Ct Cervical Spine Wo Contrast  Result Date: 10/12/2017 CLINICAL DATA:  72 year old who fell while getting out of the shower today, striking the back of the head. Laceration and scalp hematoma involving the RIGHT occipital region on clinical evaluation. Initial encounter. EXAM: CT HEAD WITHOUT CONTRAST CT CERVICAL SPINE WITHOUT CONTRAST TECHNIQUE: Multidetector CT imaging of the head and cervical spine was performed following the standard protocol without intravenous contrast. Multiplanar CT image reconstructions of the cervical spine were also generated. COMPARISON:  Numerous prior CT head and cervical spine examinations, most recently 08/19/2017. FINDINGS: CT HEAD FINDINGS Brain: Head tilt in the gantry accounts for apparent asymmetry in the cerebral hemispheres. Severe cerebellar atrophy, progressive since 2015. Moderate age related cortical and deep atrophy, unchanged. Mild changes of small vessel disease of the white matter, unchanged. No mass lesion. No midline shift. No acute hemorrhage or hematoma. No extra-axial fluid collections. No evidence of acute infarction. Vascular: Mild to moderate BILATERAL carotid siphon atherosclerosis and mild RIGHT vertebral artery atherosclerosis. Atretic LEFT vertebral artery. No hyperdense vessel. Skull: No skull fracture or  other focal osseous abnormality involving the skull. Sinuses/Orbits: Visualized paranasal sinuses, bilateral mastoid air cells and bilateral middle ear cavities well-aerated. Visualized orbits and globes normal in appearance. Other: Large RIGHT POSTERIOR parietal scalp hematoma. CT CERVICAL SPINE FINDINGS Alignment: Anatomic POSTERIOR alignment. Straightening of the usual cervical lordosis. Skull base and vertebrae: No fractures identified involving the cervical spine. Facet joints intact throughout with diffuse degenerative changes. Coronal reformatted images demonstrate an intact craniocervical junction, intact dens and intact lateral masses throughout. Soft tissues and spinal canal: No evidence of paraspinous or spinal canal hematoma. No evidence of spinal stenosis. Disc levels: Mild-to-moderate disc space narrowing and endplate hypertrophic changes at C3-4 and C4-5. Severe disc space narrowing and endplate hypertrophic changes at C5-6 and C7-T1. Moderate disc space narrowing and endplate hypertrophic changes at C6-7. Combination of uncinate and facet hypertrophy account for multilevel foraminal stenoses including severe RIGHT C3-4, mild BILATERAL C5-6, mild BILATERAL C6-7. Upper chest: Emphysematous changes involving the visualized lung apices. Severe atherosclerosis involving the aortic arch. Other: BILATERAL cervical carotid atherosclerosis. IMPRESSION: CT Head: 1. No acute intracranial abnormality. 2. Large RIGHT POSTERIOR parietal scalp hematoma without evidence of underlying skull fracture. 3. Severe cerebellar atrophy. Moderate cortical and deep  atrophy. Mild chronic microvascular ischemic changes of the white matter. CT Cervical Spine: 1. No fractures identified involving the cervical spine. 2. Multilevel degenerative disc disease, spondylosis and facet degenerative changes with multilevel foraminal stenoses as detailed above. 3. Aortic Atherosclerosis (ICD10-I70.0) and Emphysema (ICD10-J43.9).  Electronically Signed   By: Evangeline Dakin M.D.   On: 10/12/2017 17:48   Mr Brain Wo Contrast  Result Date: 10/13/2017 CLINICAL DATA:  Syncope. EXAM: MRI HEAD WITHOUT CONTRAST TECHNIQUE: Multiplanar, multiecho pulse sequences of the brain and surrounding structures were obtained without intravenous contrast. COMPARISON:  Head CT 10/12/2017 and MRI 07/02/2017 FINDINGS: Brain: There is no evidence of acute infarct, intracranial hemorrhage, mass, midline shift, or extra-axial fluid collection. There is mild cerebral atrophy. Mildly prominent, symmetric extra-axial CSF lateral to both cerebellar hemispheres is unchanged from the prior MRI though larger than on a 05/10/2017 study. There is no significant posterior fossa mass effect. Subdural fluid collection over the left cerebral convexity on the prior MRI has resolved in the interim. There is no significant cerebral white matter disease for age. Vascular: Major intracranial vascular flow voids are preserved. Skull and upper cervical spine: Unremarkable bone marrow signal. Sinuses/Orbits: Unremarkable orbits. Paranasal sinuses and mastoid air cells are clear. Other: Right parietal scalp hematoma, smaller than on yesterday's CT. Skin staples in place. IMPRESSION: 1. No acute intracranial abnormality. 2. 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 Electronically Signed   By: Logan Bores M.D.   On: 10/13/2017 15:23   Dg Chest Port 1 View  Result Date: 10/12/2017 CLINICAL DATA:  Pain after fall EXAM: PORTABLE CHEST 1 VIEW COMPARISON:  August 22, 2017 FINDINGS: The heart size and mediastinal contours are within normal limits. Both lungs are clear. The visualized skeletal structures are unremarkable. IMPRESSION: No active disease. Electronically Signed   By: Dorise Bullion III M.D   On: 10/12/2017 17:26   Dg Hip Port Unilat W Or Wo Pelvis 1 View Right  Result Date: 10/13/2017 CLINICAL DATA:  Fall last night.  RIGHT hip  pain. EXAM: DG HIP (WITH OR WITHOUT PELVIS) 1V PORT RIGHT COMPARISON:  None. FINDINGS: Single view of the pelvis and single view of the RIGHT hip. Osseous alignment is normal. No fracture line or displaced fracture fragment seen. No significant degenerative change at either hip joint. Soft tissues about the RIGHT hip are unremarkable. IMPRESSION: No acute findings.  No osseous fracture or dislocation seen. Electronically Signed   By: Franki Cabot M.D.   On: 10/13/2017 11:54    Medications:   . buPROPion  300 mg Oral Daily  . gabapentin  300 mg Oral QHS  . lamoTRIgine  400 mg Oral Daily  . midodrine  5 mg Oral TID WC  . potassium chloride  10 mEq Oral Daily  . rosuvastatin  20 mg Oral Daily  . sertraline  200 mg Oral Daily  . sodium chloride flush  3 mL Intravenous Q12H  . vitamin B-1  250 mg Oral BID   Continuous Infusions: . sodium chloride 100 mL/hr at 10/14/17 0553     LOS: 0 days   Geradine Girt  [facility]   *Please refer to [email or web address], password [email or web address] to get updated schedule on who will round on this patient, as hospitalists switch teams weekly. If 7PM-7AM, please contact night-coverage at [email or web address], password [email or web address] for any overnight needs.  10/14/2017, 2:38 PM

## 2017-10-14 NOTE — Care Management Note (Signed)
Case Management Note  Patient Details  Name: Angelica Kelley MRN: [id number] Date of Birth: 1945-12-23  Subjective/Objective:  Fall, hematoma                  Action/Plan: NCM spoke to pt at bedside. States she recently moved into [facility] on [address] around 3 weeks ago. States Medical illustrator for niece, Jilda Panda # [phone]. CSW referral for return back to ALF. PT/OT recommended SNF. Pt is refusing SNF. States she will need a RW with seat, will defer to ALF. Pt will need PTAR transport. Niece is out of town.   Expected Discharge Date:                  Expected Discharge Plan:  Assisted Living / Rest Home  In-House Referral:  Clinical Social Work  Discharge planning Services  CM Consult  Post Acute Care Choice:  Home Health Choice offered to:  Patient  DME Arranged:  N/A DME Agency:  NA  HH Arranged:  PT, refused SNF [place] Agency:  Other - See comment  Status of Service:  Completed, signed off  If discussed at [place] of Stay Meetings, dates discussed:    Additional Comments:  Erenest Rasher, RN 10/14/2017, 5:56 PM

## 2017-10-14 NOTE — Progress Notes (Signed)
  Echocardiogram 2D Echocardiogram has been performed.  Darlina Sicilian M 10/14/2017, 11:34 AM

## 2017-10-14 NOTE — Evaluation (Signed)
Physical Therapy Evaluation Patient Details Name: [name] MRN: [id number] DOB: 1945/07/20 Today's Date: 10/14/2017   History of Present Illness  Angelica Kelley a 72 y.o.femalewith medical history significant ofBPD, prior EtOH abuse in past, PAF, HTN, dementia. Came to ED after falling in shower; syncopal in ED with BP drop, pallor, hypotension, loss of bowel and bladder, bradycardia; Hgb drop as well, transfused; MRI head with no acute abnormality.  Clinical Impression   Pt admitted with above diagnosis. Pt currently with functional limitations due to the deficits listed below (see PT Problem List). Presents with decr independence and safety with mobiltiy; increased fall risk; Recommend return to SNF for completion of rehab course; Pt will benefit from skilled PT to increase their independence and safety with mobility to allow discharge to the venue listed below.       Follow Up Recommendations SNF(return to SNF and rehab course)    Equipment Recommendations  Rolling walker with 5" wheels;3in1 (PT)    Recommendations for Other Services       Precautions / Restrictions Precautions Precautions: Fall Restrictions Weight Bearing Restrictions: No      Mobility  Bed Mobility Overal bed mobility: Needs Assistance Bed Mobility: Supine to Sit     Supine to sit: Supervision     General bed mobility comments: Supervision for safety and line management  Transfers Overall transfer level: Needs assistance Equipment used: Rolling walker (2 wheeled) Transfers: Sit to/from Stand Sit to Stand: Min assist         General transfer comment: Cues for hand placement; inititated with hands on RW, and unable to get to fully standing; better when pushing up from bed  Ambulation/Gait Ambulation/Gait assistance: Min guard Ambulation Distance (Feet): (Hallway ambulation) Assistive device: Rolling walker (2 wheeled) Gait Pattern/deviations: Step-through pattern     General Gait  Details: Cues for RW use, at times picking up RW; dependent on UE support  Stairs            Wheelchair Mobility    Modified Rankin (Stroke Patients Only)       Balance Overall balance assessment: Needs assistance;History of Falls           Standing balance-Leahy Scale: Fair                               Pertinent Vitals/Pain Pain Assessment: No/denies pain    Home Living Family/patient expects to be discharged to:: Skilled nursing facility                 Additional Comments: My understanding from chart review is that she was living alone until last admission with [facility]; Niece was assisting with meds, not driving as family had confiscated keys    Prior Function Level of Independence: Needs assistance   Gait / Transfers Assistance Needed: has been at SNF, working on gait and decreasing fall risk     Comments: Pt was able to tell me she has been at rehab; Unable to tell me if they were using an assistive device with gait training     Hand Dominance   Dominant Hand: Right    Extremity/Trunk Assessment   Upper Extremity Assessment Upper Extremity Assessment: Generalized weakness    Lower Extremity Assessment Lower Extremity Assessment: Generalized weakness       Communication   Communication: No difficulties  Cognition Arousal/Alertness: Awake/alert Behavior During Therapy: WFL for tasks assessed/performed;Impulsive Overall Cognitive Status: No family/caregiver present  to determine baseline cognitive functioning Area of Impairment: Orientation;Safety/judgement;Awareness                 Orientation Level: Disoriented to;Time;Place("I fell")       Safety/Judgement: Decreased awareness of safety;Decreased awareness of deficits Awareness: Intellectual   General Comments: Indicated she 'can't remember anything" and feels "confused"; at times tearful during session      General Comments      Exercises     Assessment/Plan     PT Assessment Patient needs continued PT services  PT Problem List Decreased strength;Decreased activity tolerance;Decreased balance;Decreased mobility;Decreased coordination;Decreased knowledge of use of DME;Decreased cognition;Decreased safety awareness;Decreased knowledge of precautions       PT Treatment Interventions DME instruction;Gait training;Stair training;Functional mobility training;Therapeutic activities;Therapeutic exercise;Balance training;Neuromuscular re-education;Cognitive remediation;Patient/family education    PT Goals (Current goals can be found in the Care Plan section)  Acute Rehab PT Goals Patient Stated Goal: did not state PT Goal Formulation: With patient Time For Goal Achievement: 10/28/17 Potential to Achieve Goals: Good    Frequency Min 2X/week   Barriers to discharge        Co-evaluation               AM-PAC PT "6 Clicks" Daily Activity  Outcome Measure Difficulty turning over in bed (including adjusting bedclothes, sheets and blankets)?: A Little Difficulty moving from lying on back to sitting on the side of the bed? : A Little Difficulty sitting down on and standing up from a chair with arms (e.g., wheelchair, bedside commode, etc,.)?: Unable Help needed moving to and from a bed to chair (including a wheelchair)?: A Little Help needed walking in hospital room?: A Little Help needed climbing 3-5 steps with a railing? : A Lot 6 Click Score: 15    End of Session Equipment Utilized During Treatment: Gait belt Activity Tolerance: Patient tolerated treatment well Patient left: in chair;with call bell/phone within reach;with chair alarm set Nurse Communication: Mobility status PT Visit Diagnosis: Unsteadiness on feet (R26.81);Other abnormalities of gait and mobility (R26.89);Repeated falls (R29.6)    Time: 2993-7169 PT Time Calculation (min) (ACUTE ONLY): 20 min   Charges:   PT Evaluation $PT Eval Moderate Complexity: 1 Mod     PT G  Codes:        Roney Marion, PT  Acute Rehabilitation Services Pager [phone] Office [phone]   [place] 10/14/2017, 9:58 AM

## 2017-10-15 ENCOUNTER — Ambulatory Visit: Payer: Medicare Other | Admitting: Adult Health

## 2017-10-15 DIAGNOSIS — R55 Syncope and collapse: Secondary | ICD-10-CM | POA: Diagnosis not present

## 2017-10-15 DIAGNOSIS — S0101XA Laceration without foreign body of scalp, initial encounter: Secondary | ICD-10-CM

## 2017-10-15 DIAGNOSIS — S060X1A Concussion with loss of consciousness of 30 minutes or less, initial encounter: Secondary | ICD-10-CM | POA: Diagnosis not present

## 2017-10-15 DIAGNOSIS — G300 Alzheimer's disease with early onset: Secondary | ICD-10-CM | POA: Diagnosis not present

## 2017-10-15 LAB — CBC
HEMATOCRIT: 26 % — AB (ref 36.0–46.0)
Hemoglobin: 8.4 g/dL — ABNORMAL LOW (ref 12.0–15.0)
MCH: 28.9 pg (ref 26.0–34.0)
MCHC: 32.3 g/dL (ref 30.0–36.0)
MCV: 89.3 fL (ref 78.0–100.0)
PLATELETS: 122 10*3/uL — AB (ref 150–400)
RBC: 2.91 MIL/uL — ABNORMAL LOW (ref 3.87–5.11)
RDW: 14.8 % (ref 11.5–15.5)
WBC: 5.1 10*3/uL (ref 4.0–10.5)

## 2017-10-15 LAB — BASIC METABOLIC PANEL
ANION GAP: 9 (ref 5–15)
BUN: 11 mg/dL (ref 6–20)
CALCIUM: 8.7 mg/dL — AB (ref 8.9–10.3)
CO2: 25 mmol/L (ref 22–32)
CREATININE: 0.82 mg/dL (ref 0.44–1.00)
Chloride: 111 mmol/L (ref 101–111)
Glucose, Bld: 91 mg/dL (ref 65–99)
Potassium: 3.4 mmol/L — ABNORMAL LOW (ref 3.5–5.1)
SODIUM: 145 mmol/L (ref 135–145)

## 2017-10-15 LAB — GLUCOSE, CAPILLARY: GLUCOSE-CAPILLARY: 93 mg/dL (ref 65–99)

## 2017-10-15 MED ORDER — MAGNESIUM SULFATE 2 GM/50ML IV SOLN
2.0000 g | Freq: Once | INTRAVENOUS | Status: AC
  Administered 2017-10-15: 2 g via INTRAVENOUS
  Filled 2017-10-15: qty 50

## 2017-10-15 MED ORDER — POTASSIUM CHLORIDE CRYS ER 20 MEQ PO TBCR
40.0000 meq | EXTENDED_RELEASE_TABLET | Freq: Once | ORAL | Status: AC
  Administered 2017-10-15: 40 meq via ORAL
  Filled 2017-10-15: qty 2

## 2017-10-15 NOTE — Progress Notes (Signed)
Physical Therapy Treatment Patient Details Name: Angelica Kelley MRN: [id number] DOB: May 31, 1945 Today's Date: 10/15/2017    History of Present Illness Angelica Kelley a 72 y.o.femalewith medical history significant ofBPD, prior EtOH abuse in past, PAF, HTN, dementia. Came to ED after falling in shower; syncopal in ED with BP drop, pallor, hypotension, loss of bowel and bladder, bradycardia; Hgb drop as well, transfused; MRI head with no acute abnormality.    PT Comments    Continuing work on functional mobility and activity tolerance;  Noting modest improvements in bed mobility and transfers; Still, she has significantly impaired short-term memory, and poor insight into safety and fall risk; If her ALF can increase services, and she can have consistent help with bathing/ADLs, and mobility, we can consider dc back to ALF (is it a memory care unit?); still, noted OT recommending SNF, and if services can't be increased, we must consider  Follow Up Recommendations  SNF;Supervision/Assistance - 24 hour;Other (comment)(will consider ALF if they can ramp up services)     Equipment Recommendations  Rolling walker with 5" wheels;3in1 (PT)    Recommendations for Other Services       Precautions / Restrictions Precautions Precautions: Fall Restrictions Weight Bearing Restrictions: No    Mobility  Bed Mobility Overal bed mobility: Needs Assistance Bed Mobility: Supine to Sit     Supine to sit: Supervision     General bed mobility comments: Cues to complete task by scooting to EOB to get feet to floor  Transfers Overall transfer level: Needs assistance Equipment used: Rolling walker (2 wheeled) Transfers: Sit to/from Stand Sit to Stand: Min guard         General transfer comment: Good hand placement, min guard for safety. Pt attempts to walk away from RW without using  Ambulation/Gait Ambulation/Gait assistance: Min guard Ambulation Distance (Feet): (Hallway  ambulation) Assistive device: Rolling walker (2 wheeled) Gait Pattern/deviations: Step-through pattern     General Gait Details: Cues for RW use, at times picking up RW; dependent on UE support   Stairs             Wheelchair Mobility    Modified Rankin (Stroke Patients Only)       Balance Overall balance assessment: Needs assistance;History of Falls Sitting-balance support: Feet supported;No upper extremity supported Sitting balance-Leahy Scale: Good     Standing balance support: No upper extremity supported;During functional activity Standing balance-Leahy Scale: Fair                              Cognition Arousal/Alertness: Awake/alert Behavior During Therapy: Impulsive Overall Cognitive Status: No family/caregiver present to determine baseline cognitive functioning Area of Impairment: Memory;Safety/judgement;Awareness                 Orientation Level: Disoriented to;Time   Memory: Decreased short-term memory   Safety/Judgement: Decreased awareness of safety;Decreased awareness of deficits Awareness: Intellectual   General Comments: Pt very impulsive with transfers; leaves RW in bathroom and walks away even though we just discussed how unsteady she is on her feet.      Exercises      General Comments        Pertinent Vitals/Pain Pain Assessment: No/denies pain    Home Living Family/patient expects to be discharged to:: Assisted living             Home Equipment: Shower seat Additional Comments: Pt reports she has recently moved into [facility].  Prior Function Level of Independence: Independent      Comments: Pt reporting she does not use AD for mobility, bathes and dresses on her own without supervision. Takes meals in the dining room at ALF.   PT Goals (current goals can now be found in the care plan section) Acute Rehab PT Goals Patient Stated Goal: return to ALF PT Goal Formulation: With patient Time For  Goal Achievement: 10/28/17 Potential to Achieve Goals: Good Progress towards PT goals: Progressing toward goals    Frequency    Min 3X/week      PT Plan Current plan remains appropriate;Other (comment);Frequency needs to be updated(will consider ALF if they can incr services)    Co-evaluation              AM-PAC PT "6 Clicks" Daily Activity  Outcome Measure  Difficulty turning over in bed (including adjusting bedclothes, sheets and blankets)?: A Little Difficulty moving from lying on back to sitting on the side of the bed? : None Difficulty sitting down on and standing up from a chair with arms (e.g., wheelchair, bedside commode, etc,.)?: A Little Help needed moving to and from a bed to chair (including a wheelchair)?: A Little Help needed walking in hospital room?: A Little Help needed climbing 3-5 steps with a railing? : A Lot 6 Click Score: 18    End of Session Equipment Utilized During Treatment: Gait belt Activity Tolerance: Patient tolerated treatment well Patient left: in chair;with call bell/phone within reach;with chair alarm set Nurse Communication: Mobility status PT Visit Diagnosis: Unsteadiness on feet (R26.81);Other abnormalities of gait and mobility (R26.89);Repeated falls (R29.6)     Time: 8768-1157 PT Time Calculation (min) (ACUTE ONLY): 23 min  Charges:  $Gait Training: 8-22 mins $Therapeutic Activity: 8-22 mins                    G Codes:       Roney Marion, PT  Acute Rehabilitation Services Pager [phone] Office [place] 10/15/2017, 4:46 PM

## 2017-10-15 NOTE — Progress Notes (Signed)
Progress Note    Angelica Kelley  XIP:[id number] DOB: January 03, 1946  DOA: 10/12/2017 PCP: Unk Pinto, MD    Brief Narrative:     Medical records reviewed and are as summarized below:  Angelica Kelley is an 72 y.o. female with medical history significant ofBPD, prior EtOH abuse in past, PAF, HTN, dementia. Patient slipped, fell, hit head in shower. Not believed to have had syncope at that time but not sure. No N/V at that time. Patient brought in to ED. In triage patient had syncope associated with BP drop, became pale, hypotension, started "posturing" had loss of bowel and bladder, bit tongue.   Then had another episode of syncope an hour or two later:Pt stated she started feeling hot. Pt's Bps dropped into the 67H systolic. Attempted to sit pt up to drink some gingerale. Pt's Bps dropped into the 41P systolic. Pt then started vomiting and had a syncopal episode in bed. Another fluid bolus started per MD.  Patient became bradycardic during 2nd episode (s.brady) according to EDP. She was later found to have significant drop in her hemoglobin with no obvious source.    Assessment/Plan:   Principal Problem:   Syncope Active Problems:   Bipolar depression (HCC)   Dementia   Chronic anemia  Syncope vs fall -patient reports slipping and falling -subsequently in the ER, had 2 syncopal episodes -s/p IVF and blood -recheck orthostatics with TED hose -echo: - Left ventricle: The cavity size was normal. Wall thickness was   increased in a pattern of mild LVH. Systolic function was   vigorous. The estimated ejection fraction was in the range of 65%   to 70%. Wall motion was normal; there were no regional wall   motion abnormalities. Doppler parameters are consistent with   abnormal left ventricular relaxation (grade 1 diastolic   dysfunction). -will need event monitor as well as an outpatient-- other episodes in the past  Hypokalemia  -replete along with  Mg  Concussion -not sure how progressive patient's dementia is-- here her short term memory is extremely impaired -chart review from 3/27 shows A+Ox3 in PCPs office, but recent April admission shows memory impairment  Anemia:  -s/p 2 units PRBC -?ABLA-- had posterior head lac -still trending down-- check CBC in AM  Thrombocytopenia -trending down -reheck in AM  Dementia: -? baseline.  GERD:  - PPIs  hyperlipidemia: Continue statin  bipolar disorder: Continue with Zoloft and Wellbutrin -monitor Qtc  Patient is refusing SNF but I am not sure the ALF can meet her needs-- defer to care management a safe d/c plan    Family Communication/Anticipated D/C date and plan/Code Status   DVT prophylaxis: scd Code Status: Full Code.  Family Communication: none at bedside Disposition Plan: SNF vs back to ALF   Medical Consultants:    None.   Subjective:   Asking to take a shower  Objective:    Vitals:   10/14/17 1624 10/14/17 2038 10/15/17 0430 10/15/17 1003  BP: (!) 96/55 118/62 124/65 128/63  Pulse: 72 66 63 72  Resp: 18 18 18 20   Temp: 98.7 F (37.1 C) 98.5 F (36.9 C) 97.9 F (36.6 C) 98.6 F (37 C)  TempSrc: Oral Oral Oral Oral  SpO2: 99% 99% 99% 97%  Weight:   64 kg (141 lb 1.5 oz)   Height:        Intake/Output Summary (Last 24 hours) at 10/15/2017 1545 Last data filed at 10/15/2017 0932 Gross per 24 hour  Intake  900 ml  Output -  Net 900 ml   Filed Weights   10/13/17 1527 10/13/17 2101 10/15/17 0430  Weight: 61.8 kg (136 lb 3.9 oz) 61.8 kg (136 lb 3.9 oz) 64 kg (141 lb 1.5 oz)    Exam: In bed, NAD Remembers me from prior day along with nurse Impulsive rrr Matted blood on back of head +BS, soft  Data Reviewed:   I have personally reviewed following labs and imaging studies:  Labs: Labs show the following:   Basic Metabolic Panel: Recent Labs  Lab 10/12/17 1657 10/13/17 0540 10/14/17 0639 10/15/17 0427  NA 140 142 144 145  K  5.2* 4.7 3.6 3.4*  CL 104 111 114* 111  CO2  --  26 26 25   GLUCOSE 148* 110* 102* 91  BUN 30* 17 8 11   CREATININE 1.00 0.84 0.76 0.82  CALCIUM  --  8.1* 8.3* 8.7*  MG  --   --  1.7  --    GFR Estimated Creatinine Clearance: 54.2 mL/min (by C-G formula based on SCr of 0.82 mg/dL). Liver Function Tests: No results for input(s): AST, ALT, ALKPHOS, BILITOT, PROT, ALBUMIN in the last 168 hours. No results for input(s): LIPASE, AMYLASE in the last 168 hours. No results for input(s): AMMONIA in the last 168 hours. Coagulation profile No results for input(s): INR, PROTIME in the last 168 hours.  CBC: Recent Labs  Lab 10/12/17 1643  10/13/17 0540 10/13/17 0716 10/13/17 1542 10/14/17 0639 10/15/17 0427  WBC 12.2*  --  8.8 8.4  --  6.7 5.1  NEUTROABS 8.4*  --   --  6.5  --  4.6  --   HGB 9.1*   < > 6.7* 6.9* 9.1* 8.6* 8.4*  HCT 30.2*   < > 21.4* 22.6* 28.6* 26.6* 26.0*  MCV 92.4  --  91.1 92.2  --  88.1 89.3  PLT 192  --  140* 143*  --  132* 122*   < > = values in this interval not displayed.   Cardiac Enzymes: No results for input(s): CKTOTAL, CKMB, CKMBINDEX, TROPONINI in the last 168 hours. BNP (last 3 results) No results for input(s): PROBNP in the last 8760 hours. CBG: Recent Labs  Lab 10/14/17 0516 10/15/17 0728  GLUCAP 93 93   D-Dimer: No results for input(s): DDIMER in the last 72 hours. Hgb A1c: No results for input(s): HGBA1C in the last 72 hours. Lipid Profile: No results for input(s): CHOL, HDL, LDLCALC, TRIG, CHOLHDL, LDLDIRECT in the last 72 hours. Thyroid function studies: No results for input(s): TSH, T4TOTAL, T3FREE, THYROIDAB in the last 72 hours.  Invalid input(s): FREET3 Anemia work up: No results for input(s): VITAMINB12, FOLATE, FERRITIN, TIBC, IRON, RETICCTPCT in the last 72 hours. Sepsis Labs: Recent Labs  Lab 10/13/17 0540 10/13/17 0716 10/14/17 0639 10/15/17 0427  WBC 8.8 8.4 6.7 5.1    Microbiology No results found for this or any  previous visit (from the past 240 hour(s)).  Procedures and diagnostic studies:  No results found.  Medications:   . buPROPion  300 mg Oral Daily  . gabapentin  300 mg Oral QHS  . lamoTRIgine  400 mg Oral Daily  . midodrine  5 mg Oral TID WC  . potassium chloride  10 mEq Oral Daily  . rosuvastatin  20 mg Oral Daily  . sertraline  200 mg Oral Daily  . sodium chloride flush  3 mL Intravenous Q12H  . vitamin B-1  250 mg Oral BID  Continuous Infusions:    LOS: 0 days   Geradine Girt  [facility]   *Please refer to [email or web address], password [email or web address] to get updated schedule on who will round on this patient, as hospitalists switch teams weekly. If 7PM-7AM, please contact night-coverage at [email or web address], password [email or web address] for any overnight needs.  10/15/2017, 3:45 PM

## 2017-10-15 NOTE — Evaluation (Signed)
Occupational Therapy Evaluation Patient Details Name: Angelica Kelley MRN: [id number] DOB: 04-27-1946 Today's Date: 10/15/2017    History of Present Illness Angelica Kelley a 72 y.o.femalewith medical history significant ofBPD, prior EtOH abuse in past, PAF, HTN, dementia. Came to ED after falling in shower; syncopal in ED with BP drop, pallor, hypotension, loss of bowel and bladder, bradycardia; Hgb drop as well, transfused; MRI head with no acute abnormality.   Clinical Impression   Pt reports she was independent with ADL PTA but with hx of falls during ADL. Currently pt min guard assist for functional mobility and min assist for standing ADL due to balance deficits. Pt with poor safety awareness and despite discussion of deficits and safety education pt continues to demonstrate poor insight. Recommending SNF for follow up to maximize independence and safety with ADL and functional mobility. Pt would benefit from continued skilled OT to address established goals.    Follow Up Recommendations  SNF;Supervision/Assistance - 24 hour    Equipment Recommendations  None recommended by OT    Recommendations for Other Services       Precautions / Restrictions Precautions Precautions: Fall Restrictions Weight Bearing Restrictions: No      Mobility Bed Mobility               General bed mobility comments: Pt OOB in chair upon arrival  Transfers Overall transfer level: Needs assistance Equipment used: Rolling walker (2 wheeled) Transfers: Sit to/from Stand Sit to Stand: Min guard         General transfer comment: Good hand placement, min guard for safety. Pt attempts to walk away from RW without using    Balance Overall balance assessment: Needs assistance;History of Falls Sitting-balance support: Feet supported;No upper extremity supported Sitting balance-Leahy Scale: Good     Standing balance support: No upper extremity supported;During functional activity Standing  balance-Leahy Scale: Fair                             ADL either performed or assessed with clinical judgement   ADL Overall ADL's : Needs assistance/impaired Eating/Feeding: Set up;Sitting   Grooming: Min guard;Standing;Wash/dry hands   Upper Body Bathing: Set up;Supervision/ safety;Sitting   Lower Body Bathing: Minimal assistance;Sit to/from stand Lower Body Bathing Details (indicate cue type and reason): min assist for balance Upper Body Dressing : Set up;Supervision/safety   Lower Body Dressing: Minimal assistance;Sit to/from stand Lower Body Dressing Details (indicate cue type and reason): assist to start underwear over feet. Pt able to don/doff socks Toilet Transfer: Min guard;Ambulation;RW;Regular Toilet   Toileting- Clothing Manipulation and Hygiene: Minimal assistance;Sit to/from stand Toileting - Clothing Manipulation Details (indicate cue type and reason): for balance during peri care     Functional mobility during ADLs: Min guard;Rolling walker       Vision         Perception     Praxis      Pertinent Vitals/Pain Pain Assessment: No/denies pain     Hand Dominance Right   Extremity/Trunk Assessment Upper Extremity Assessment Upper Extremity Assessment: Generalized weakness   Lower Extremity Assessment Lower Extremity Assessment: Defer to PT evaluation       Communication Communication Communication: No difficulties   Cognition Arousal/Alertness: Awake/alert Behavior During Therapy: Impulsive Overall Cognitive Status: No family/caregiver present to determine baseline cognitive functioning Area of Impairment: Memory;Safety/judgement;Awareness                 Orientation Level: Disoriented  to;Time   Memory: Decreased short-term memory   Safety/Judgement: Decreased awareness of safety;Decreased awareness of deficits Awareness: Intellectual   General Comments: Pt very impulsive with transfers; leaves RW in bathroom and walks  away even though we just discussed how unsteady she is on her feet.   General Comments       Exercises     Shoulder Instructions      Home Living Family/patient expects to be discharged to:: Assisted living                             Home Equipment: Shower seat   Additional Comments: Pt reports she has recently moved into [facility].       Prior Functioning/Environment Level of Independence: Independent        Comments: Pt reporting she does not use AD for mobility, bathes and dresses on her own without supervision. Takes meals in the dining room at ALF.        OT Problem List: Decreased strength;Impaired balance (sitting and/or standing);Decreased cognition;Decreased safety awareness;Decreased knowledge of use of DME or AE      OT Treatment/Interventions: Self-care/ADL training;DME and/or AE instruction;Patient/family education;Balance training;Therapeutic activities;Cognitive remediation/compensation    OT Goals(Current goals can be found in the care plan section) Acute Rehab OT Goals Patient Stated Goal: return to ALF OT Goal Formulation: With patient Time For Goal Achievement: 10/29/17 Potential to Achieve Goals: Good  OT Frequency: Min 2X/week   Barriers to D/C:            Co-evaluation              AM-PAC PT "6 Clicks" Daily Activity     Outcome Measure Help from another person eating meals?: None Help from another person taking care of personal grooming?: A Little Help from another person toileting, which includes using toliet, bedpan, or urinal?: A Little Help from another person bathing (including washing, rinsing, drying)?: A Little Help from another person to put on and taking off regular upper body clothing?: A Little Help from another person to put on and taking off regular lower body clothing?: A Little 6 Click Score: 19   End of Session Equipment Utilized During Treatment: Rolling walker Nurse Communication: Mobility  status  Activity Tolerance: Patient tolerated treatment well Patient left: in chair;with call bell/phone within reach;with chair alarm set  OT Visit Diagnosis: Unsteadiness on feet (R26.81);Repeated falls (R29.6);Muscle weakness (generalized) (M62.81)                Time: 6812-7517 OT Time Calculation (min): 15 min Charges:  OT General Charges $OT Visit: 1 Visit OT Evaluation $OT Eval Moderate Complexity: 1 Mod G-Codes:     Angelica Kelley A. Ulice Brilliant, M.S., OTR/L Acute Rehab Department: [phone]  Angelica Kelley 10/15/2017, 3:12 PM

## 2017-10-16 DIAGNOSIS — R279 Unspecified lack of coordination: Secondary | ICD-10-CM | POA: Diagnosis not present

## 2017-10-16 DIAGNOSIS — R55 Syncope and collapse: Secondary | ICD-10-CM | POA: Diagnosis not present

## 2017-10-16 DIAGNOSIS — S060X1A Concussion with loss of consciousness of 30 minutes or less, initial encounter: Secondary | ICD-10-CM | POA: Diagnosis not present

## 2017-10-16 DIAGNOSIS — Z743 Need for continuous supervision: Secondary | ICD-10-CM | POA: Diagnosis not present

## 2017-10-16 DIAGNOSIS — G300 Alzheimer's disease with early onset: Secondary | ICD-10-CM | POA: Diagnosis not present

## 2017-10-16 DIAGNOSIS — S0101XA Laceration without foreign body of scalp, initial encounter: Secondary | ICD-10-CM | POA: Diagnosis not present

## 2017-10-16 DIAGNOSIS — F0281 Dementia in other diseases classified elsewhere with behavioral disturbance: Secondary | ICD-10-CM | POA: Diagnosis not present

## 2017-10-16 LAB — BASIC METABOLIC PANEL
Anion gap: 5 (ref 5–15)
BUN: 10 mg/dL (ref 6–20)
CALCIUM: 8.3 mg/dL — AB (ref 8.9–10.3)
CO2: 26 mmol/L (ref 22–32)
Chloride: 112 mmol/L — ABNORMAL HIGH (ref 101–111)
Creatinine, Ser: 0.79 mg/dL (ref 0.44–1.00)
GFR calc Af Amer: >60 mL/min (ref >60)
GLUCOSE: 91 mg/dL (ref 65–99)
Potassium: 3.8 mmol/L (ref 3.5–5.1)
SODIUM: 143 mmol/L (ref 135–145)

## 2017-10-16 LAB — CBC
HCT: 27.4 % — ABNORMAL LOW (ref 36.0–46.0)
Hemoglobin: 8.8 g/dL — ABNORMAL LOW (ref 12.0–15.0)
MCH: 28.7 pg (ref 26.0–34.0)
MCHC: 32.1 g/dL (ref 30.0–36.0)
MCV: 89.3 fL (ref 78.0–100.0)
PLATELETS: 148 10*3/uL — AB (ref 150–400)
RBC: 3.07 MIL/uL — ABNORMAL LOW (ref 3.87–5.11)
RDW: 14.6 % (ref 11.5–15.5)
WBC: 5.9 10*3/uL (ref 4.0–10.5)

## 2017-10-16 LAB — GLUCOSE, CAPILLARY: Glucose-Capillary: 97 mg/dL (ref 65–99)

## 2017-10-16 NOTE — Clinical Social Work Note (Signed)
Clinical Social Work Assessment  Patient Details  Name: Angelica Kelley MRN: [id number] Date of Birth: 09-19-1945  Date of referral:  10/16/17               Reason for consult:  Discharge Planning                Permission sought to share information with:  Family Supports Permission granted to share information::  Yes, Verbal Permission Granted  Name::     Angelica Kelley  Agency::     Relationship::  Niece  Contact Information:  [phone]  Housing/Transportation Living arrangements for the past 2 months:  Assisted Living Facility(Angelica Kelley) Source of Information:  Patient, Other (Comment Required)(Staff person from [facility] in call to [place]) Patient Interpreter Needed:  None Criminal Activity/Legal Involvement Pertinent to Current Situation/Hospitalization:  No - Comment as needed Significant Relationships:  Other Family Members(Niece and brother) Lives with:  Facility Resident(Angelica Kelley) Do you feel safe going back to the place where you live?  Yes Need for family participation in patient care:  Yes (Comment)  Care giving concerns:  Patient expressed no concerns regarding her care at ALF.  Social Worker assessment / plan:  CSW talked with patient at the bedside regarding her discharge. [name] was sitting up eating her lunch and engaged easily in conversation with CSW. Patient confirmed that she came to hospital from ALF and has been there 3/4 weeks. Patient responded that she likes facility and "that's gonna be my home". [name] also shared with CSW regarding her roommate. Patient reported that she previously lived in a trailer and her niece Angelica Kelley cleaned her trailer out and sold it and she thinks that her niece will also sell her car.  Employment status:  Retired Research officer, political party) PT Recommendations:  [place] / Referral to community resources:  Other (Comment Required)(None needed or requested  as patient returning to ALF)  Patient/Family's Response to care:  [name] reported no concerns regarding her care during hospitalization.  Patient/Family's Understanding of and Emotional Response to Diagnosis, Current Treatment, and Prognosis:  Not discussed.  Emotional Assessment Appearance:  Appears stated age Attitude/Demeanor/Rapport:  Engaged Affect (typically observed):  Pleasant, Appropriate Orientation:  Oriented to Self, Oriented to Situation, Oriented to Place, Oriented to  Time Alcohol / Substance use:  Tobacco Use, Alcohol Use, Illicit Drugs(Patient reported that she quit smoking, does not consume alchol or use illicict drugs) Psych involvement (Current and /or in the community):  No (Comment)  Discharge Needs  Concerns to be addressed:  Discharge Planning Concerns Readmission within the last 30 days:  No Current discharge risk:  None Barriers to Discharge:  No Barriers Identified   Sable Feil, LCSW 10/16/2017, 2:06 PM

## 2017-10-16 NOTE — Progress Notes (Signed)
Patient Discharge: Disposition: Patient discharged to [facility]. Gave report to Rushita, (nurse at ALF) and answered all her questions.   IV: Discontinue IV before discharge. Telemetry: Discontinued Tele before discharge, CCMD notified. Transportation: Patient transported via [facility], and patient took all her belongings with her.

## 2017-10-16 NOTE — Progress Notes (Addendum)
Occupational Therapy Treatment Patient Details Name: Angelica Kelley MRN: [id number] DOB: 1946/01/07 Today's Date: 10/16/2017    History of present illness Angelica Kelley a 72 y.o.femalewith medical history significant ofBPD, prior EtOH abuse in past, PAF, HTN, dementia. Came to ED after falling in shower; syncopal in ED with BP drop, pallor, hypotension, loss of bowel and bladder, bradycardia; Hgb drop as well, transfused; MRI head with no acute abnormality.   OT comments  Pt progressing towards OT goals, presents sitting EOB pleasant and willing to participate in therapy session. Pt completing room level functional mobility and standing grooming ADLs using RW with overall minguard assist, intermittently requiring minA and verbal cues to ensure safe RW use. Currently requires MinA for LB ADLs. Pt continues to demonstrate decreased safety awareness and requires cues during session due to decreased insight of current deficits and need for assist. Unsure of assist available at pt's current ALF. Recommend pt initially have 24hr supervision at time of discharge due to current unsteadiness and decreased safety awareness - if ALF is not able to provide this level of assist continue to recommend SNF level therapy services to maximize her safety and independence with ADLs and mobility. Will continue to follow acutely to progress pt towards established OT goals.   Follow Up Recommendations  SNF;Supervision/Assistance - 24 hour    Equipment Recommendations  None recommended by OT          Precautions / Restrictions Precautions Precautions: Fall Restrictions Weight Bearing Restrictions: No       Mobility Bed Mobility               General bed mobility comments: sitting EOB upon entering room   Transfers Overall transfer level: Needs assistance Equipment used: Rolling walker (2 wheeled) Transfers: Sit to/from Stand Sit to Stand: Min assist         General transfer comment: light  minA to rise and steady at RW, demonstrates safe hand placement     Balance Overall balance assessment: Needs assistance;History of Falls Sitting-balance support: Feet supported;No upper extremity supported Sitting balance-Leahy Scale: Good     Standing balance support: No upper extremity supported;During functional activity Standing balance-Leahy Scale: Fair Standing balance comment: maintains static standing without UE support and close minguard for safety                            ADL either performed or assessed with clinical judgement   ADL Overall ADL's : Needs assistance/impaired Eating/Feeding: Set up;Sitting Eating/Feeding Details (indicate cue type and reason): to open containers  Grooming: Min guard;Standing;Wash/dry hands;Wash/dry face;Oral care Grooming Details (indicate cue type and reason): close min guard for safety during static standing              Lower Body Dressing: Minimal assistance;Sit to/from stand Lower Body Dressing Details (indicate cue type and reason): pt donning socks sitting EOB with setup assist              Functional mobility during ADLs: Min guard;Rolling walker General ADL Comments: requires min safety cues during this session for safe RW use                       Cognition Arousal/Alertness: Awake/alert Behavior During Therapy: Impulsive Overall Cognitive Status: No family/caregiver present to determine baseline cognitive functioning Area of Impairment: Memory;Safety/judgement;Awareness  Memory: Decreased short-term memory   Safety/Judgement: Decreased awareness of safety;Decreased awareness of deficits Awareness: Intellectual   General Comments: per chart review pt with baseline dementia. requires verbal safety cues for RW use during session. When confirming that pt knows how to call for nurse using call bell pt is able to state correctly, however pt also states she just needs to  sit on the side of the bed (referring to bed alarm) though pt in chair at this time and not in bed, stating she will get up on her own, reinforced calling for assist and to have staff present for safety when needing to get up                           Pertinent Vitals/ Pain       Pain Assessment: Faces Faces Pain Scale: Hurts a little bit Pain Location: back of head  Pain Descriptors / Indicators: Sore Pain Intervention(s): Monitored during session                                                          Frequency  Min 2X/week        Progress Toward Goals  OT Goals(current goals can now be found in the care plan section)  Progress towards OT goals: Progressing toward goals  Acute Rehab OT Goals Patient Stated Goal: return to ALF OT Goal Formulation: With patient Time For Goal Achievement: 10/29/17 Potential to Achieve Goals: Good  Plan Discharge plan remains appropriate                     AM-PAC PT "6 Clicks" Daily Activity     Outcome Measure   Help from another person eating meals?: None Help from another person taking care of personal grooming?: A Little Help from another person toileting, which includes using toliet, bedpan, or urinal?: A Little Help from another person bathing (including washing, rinsing, drying)?: A Little Help from another person to put on and taking off regular upper body clothing?: A Little Help from another person to put on and taking off regular lower body clothing?: A Little 6 Click Score: 19    End of Session Equipment Utilized During Treatment: Rolling walker;Gait belt  OT Visit Diagnosis: Unsteadiness on feet (R26.81);Repeated falls (R29.6);Muscle weakness (generalized) (M62.81)   Activity Tolerance Patient tolerated treatment well   Patient Left in chair;with call bell/phone within reach;with chair alarm set   Nurse Communication Mobility status        Time: 0623-7628 OT Time  Calculation (min): 21 min  Charges: OT General Charges $OT Visit: 1 Visit OT Treatments $Self Care/Home Management : 8-22 mins  Angelica Kelley, OT Pager [phone] 10/16/2017   Angelica Kelley 10/16/2017, 9:43 AM

## 2017-10-16 NOTE — Clinical Social Work Note (Signed)
Patient medically stable for discharge and will return to [facility], transported by ambulance. Clair Gulling at [facility] contacted CSW and advised that patient can return to facility. Discharge clinicals transmitted to facility and niece Jilda Panda contacted and advised of discharge. CSW signing off as no other SW intervention services needed.  Marielle Mantione Givens, MSW, LCSW Licensed Clinical Social Worker [place] [phone]

## 2017-10-16 NOTE — Discharge Summary (Signed)
Physician Discharge Summary  Angelica Kelley NTZ:[id number] DOB: 11-11-1945 DOA: 10/12/2017  PCP: Unk Pinto, MD  Admit date: 10/12/2017 Discharge date: 10/16/2017  Admitted From: ALF Discharge disposition: ALF with increased supervision   Recommendations for Outpatient Follow-Up:   recs per OT: Recommend pt initially have 24hr supervision at time of discharge due to current unsteadiness and decreased safety awareness recs per PT: she has significantly impaired short-term memory, and poor insight into safety and fall risk; If her ALF can increase services, and she can have consistent help with bathing/ADLs, and mobility, we can consider dc back to ALF DME: rolling walker with 5' wheels and 3:1 Cbc/bmp 1 week TED hose Supervision when ambulating Staple removal (check wound at 7 days to see if healed)-- keep staples clean and dry Event monitor per cards  Discharge Diagnosis:   Principal Problem:   Syncope Active Problems:   Bipolar depression ([place])   Dementia   Chronic anemia    Discharge Condition: Improved.  Diet recommendation: Regular.  Wound care: None.  Code status: Full.   History of Present Illness:   Angelica Kelley is an 72 y.o. female with medical history significant ofBPD, prior EtOH abuse in past, PAF, HTN, dementia. Patient slipped, fell, hit head in shower. Not believed to have had syncope at that time but not sure. No N/V at that time. Patient brought in to ED. In triage patient had syncope associated with BP drop, became pale, hypotension, started "posturing" had loss of bowel and bladder, bit tongue.   Then had another episode of syncope an hour or two later:Pt stated she started feeling hot. Pt's Bps dropped into the 67R systolic. Attempted to sit pt up to drink some gingerale. Pt's Bps dropped into the 91M systolic. Pt then started vomiting and had a syncopal episode in bed. Another fluid bolus started per MD.  Patient became bradycardic  during 2nd episode (s.brady) according to EDP.She was later found to have significant drop in her hemoglobin.    Hospital Course by Problem:   Syncope vs fall with head lac and staples -patient reports slipping and falling in showed -subsequently in the ER, had 2 syncopal episodes-- Hgb low -s/p IVF and blood - orthostatics improved with TED hose/IVF/PRBC-- already on midodine -echo: - Left ventricle: The cavity size was normal. Wall thickness was increased in a pattern of mild LVH. Systolic function was vigorous. The estimated ejection fraction was in the range of 65% to 70%. Wall motion was normal; there were no regional wall motion abnormalities. Doppler parameters are consistent with abnormal left ventricular relaxation (grade 1 diastolic dysfunction). -will need event monitor as well as an outpatient-- other episodes in the past -staples placed in ER-- recheck wound on 6/8 and consider removing if healed  Hypokalemia  -repleted along with Mg  Concussion -per friend at bedside, back to baseline  Anemia: -s/p 2 units PRBC -?ABLA-- had posterior head lac -stable  Thrombocytopenia -stabilized  Dementia: -per friend at bedside, back to baseline  GERD: - PPIs  hyperlipidemia:Continue statin  bipolar disorder:Continue with Zoloft and Wellbutrin      Medical Consultants:      Discharge Exam:   Vitals:   10/16/17 0520 10/16/17 0924  BP: 140/60 (!) 105/41  Pulse: 71 67  Resp: 16 20  Temp: 98 F (36.7 C) 98.6 F (37 C)  SpO2: 97% 99%   Vitals:   10/15/17 1708 10/15/17 2053 10/16/17 0520 10/16/17 0924  BP: 130/61 107/71 140/60 Marland Kitchen)  105/41  Pulse: 67 65 71 67  Resp: 18 14 16 20   Temp: 98.2 F (36.8 C) 98.5 F (36.9 C) 98 F (36.7 C) 98.6 F (37 C)  TempSrc: Oral Oral Oral Oral  SpO2: 100% 100% 97% 99%  Weight:  64 kg (141 lb)    Height:        General exam: Appears calm and comfortable. -- visiting with friend at  bedside   The results of significant diagnostics from this hospitalization (including imaging, microbiology, ancillary and laboratory) are listed below for reference.     Procedures and Diagnostic Studies:   Ct Head Wo Contrast  Result Date: 10/12/2017 CLINICAL DATA:  72 year old who fell while getting out of the shower today, striking the back of the head. Laceration and scalp hematoma involving the RIGHT occipital region on clinical evaluation. Initial encounter. EXAM: CT HEAD WITHOUT CONTRAST CT CERVICAL SPINE WITHOUT CONTRAST TECHNIQUE: Multidetector CT imaging of the head and cervical spine was performed following the standard protocol without intravenous contrast. Multiplanar CT image reconstructions of the cervical spine were also generated. COMPARISON:  Numerous prior CT head and cervical spine examinations, most recently 08/19/2017. FINDINGS: CT HEAD FINDINGS Brain: Head tilt in the gantry accounts for apparent asymmetry in the cerebral hemispheres. Severe cerebellar atrophy, progressive since 2015. Moderate age related cortical and deep atrophy, unchanged. Mild changes of small vessel disease of the white matter, unchanged. No mass lesion. No midline shift. No acute hemorrhage or hematoma. No extra-axial fluid collections. No evidence of acute infarction. Vascular: Mild to moderate BILATERAL carotid siphon atherosclerosis and mild RIGHT vertebral artery atherosclerosis. Atretic LEFT vertebral artery. No hyperdense vessel. Skull: No skull fracture or other focal osseous abnormality involving the skull. Sinuses/Orbits: Visualized paranasal sinuses, bilateral mastoid air cells and bilateral middle ear cavities well-aerated. Visualized orbits and globes normal in appearance. Other: Large RIGHT POSTERIOR parietal scalp hematoma. CT CERVICAL SPINE FINDINGS Alignment: Anatomic POSTERIOR alignment. Straightening of the usual cervical lordosis. Skull base and vertebrae: No fractures identified involving  the cervical spine. Facet joints intact throughout with diffuse degenerative changes. Coronal reformatted images demonstrate an intact craniocervical junction, intact dens and intact lateral masses throughout. Soft tissues and spinal canal: No evidence of paraspinous or spinal canal hematoma. No evidence of spinal stenosis. Disc levels: Mild-to-moderate disc space narrowing and endplate hypertrophic changes at C3-4 and C4-5. Severe disc space narrowing and endplate hypertrophic changes at C5-6 and C7-T1. Moderate disc space narrowing and endplate hypertrophic changes at C6-7. Combination of uncinate and facet hypertrophy account for multilevel foraminal stenoses including severe RIGHT C3-4, mild BILATERAL C5-6, mild BILATERAL C6-7. Upper chest: Emphysematous changes involving the visualized lung apices. Severe atherosclerosis involving the aortic arch. Other: BILATERAL cervical carotid atherosclerosis. IMPRESSION: CT Head: 1. No acute intracranial abnormality. 2. Large RIGHT POSTERIOR parietal scalp hematoma without evidence of underlying skull fracture. 3. Severe cerebellar atrophy. Moderate cortical and deep atrophy. Mild chronic microvascular ischemic changes of the white matter. CT Cervical Spine: 1. No fractures identified involving the cervical spine. 2. Multilevel degenerative disc disease, spondylosis and facet degenerative changes with multilevel foraminal stenoses as detailed above. 3. Aortic Atherosclerosis (ICD10-I70.0) and Emphysema (ICD10-J43.9). Electronically Signed   By: Evangeline Dakin M.D.   On: 10/12/2017 17:48   Ct Cervical Spine Wo Contrast  Result Date: 10/12/2017 CLINICAL DATA:  72 year old who fell while getting out of the shower today, striking the back of the head. Laceration and scalp hematoma involving the RIGHT occipital region on clinical evaluation. Initial encounter. EXAM:  CT HEAD WITHOUT CONTRAST CT CERVICAL SPINE WITHOUT CONTRAST TECHNIQUE: Multidetector CT imaging of the head  and cervical spine was performed following the standard protocol without intravenous contrast. Multiplanar CT image reconstructions of the cervical spine were also generated. COMPARISON:  Numerous prior CT head and cervical spine examinations, most recently 08/19/2017. FINDINGS: CT HEAD FINDINGS Brain: Head tilt in the gantry accounts for apparent asymmetry in the cerebral hemispheres. Severe cerebellar atrophy, progressive since 2015. Moderate age related cortical and deep atrophy, unchanged. Mild changes of small vessel disease of the white matter, unchanged. No mass lesion. No midline shift. No acute hemorrhage or hematoma. No extra-axial fluid collections. No evidence of acute infarction. Vascular: Mild to moderate BILATERAL carotid siphon atherosclerosis and mild RIGHT vertebral artery atherosclerosis. Atretic LEFT vertebral artery. No hyperdense vessel. Skull: No skull fracture or other focal osseous abnormality involving the skull. Sinuses/Orbits: Visualized paranasal sinuses, bilateral mastoid air cells and bilateral middle ear cavities well-aerated. Visualized orbits and globes normal in appearance. Other: Large RIGHT POSTERIOR parietal scalp hematoma. CT CERVICAL SPINE FINDINGS Alignment: Anatomic POSTERIOR alignment. Straightening of the usual cervical lordosis. Skull base and vertebrae: No fractures identified involving the cervical spine. Facet joints intact throughout with diffuse degenerative changes. Coronal reformatted images demonstrate an intact craniocervical junction, intact dens and intact lateral masses throughout. Soft tissues and spinal canal: No evidence of paraspinous or spinal canal hematoma. No evidence of spinal stenosis. Disc levels: Mild-to-moderate disc space narrowing and endplate hypertrophic changes at C3-4 and C4-5. Severe disc space narrowing and endplate hypertrophic changes at C5-6 and C7-T1. Moderate disc space narrowing and endplate hypertrophic changes at C6-7. Combination  of uncinate and facet hypertrophy account for multilevel foraminal stenoses including severe RIGHT C3-4, mild BILATERAL C5-6, mild BILATERAL C6-7. Upper chest: Emphysematous changes involving the visualized lung apices. Severe atherosclerosis involving the aortic arch. Other: BILATERAL cervical carotid atherosclerosis. IMPRESSION: CT Head: 1. No acute intracranial abnormality. 2. Large RIGHT POSTERIOR parietal scalp hematoma without evidence of underlying skull fracture. 3. Severe cerebellar atrophy. Moderate cortical and deep atrophy. Mild chronic microvascular ischemic changes of the white matter. CT Cervical Spine: 1. No fractures identified involving the cervical spine. 2. Multilevel degenerative disc disease, spondylosis and facet degenerative changes with multilevel foraminal stenoses as detailed above. 3. Aortic Atherosclerosis (ICD10-I70.0) and Emphysema (ICD10-J43.9). Electronically Signed   By: Evangeline Dakin M.D.   On: 10/12/2017 17:48   Mr Brain Wo Contrast  Result Date: 10/13/2017 CLINICAL DATA:  Syncope. EXAM: MRI HEAD WITHOUT CONTRAST TECHNIQUE: Multiplanar, multiecho pulse sequences of the brain and surrounding structures were obtained without intravenous contrast. COMPARISON:  Head CT 10/12/2017 and MRI 07/02/2017 FINDINGS: Brain: There is no evidence of acute infarct, intracranial hemorrhage, mass, midline shift, or extra-axial fluid collection. There is mild cerebral atrophy. Mildly prominent, symmetric extra-axial CSF lateral to both cerebellar hemispheres is unchanged from the prior MRI though larger than on a 05/10/2017 study. There is no significant posterior fossa mass effect. Subdural fluid collection over the left cerebral convexity on the prior MRI has resolved in the interim. There is no significant cerebral white matter disease for age. Vascular: Major intracranial vascular flow voids are preserved. Skull and upper cervical spine: Unremarkable bone marrow signal. Sinuses/Orbits:  Unremarkable orbits. Paranasal sinuses and mastoid air cells are clear. Other: Right parietal scalp hematoma, smaller than on yesterday's CT. Skin staples in place. IMPRESSION: 1. No acute intracranial abnormality. 2. 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 Electronically Signed   By:  Logan Bores M.D.   On: 10/13/2017 15:23   Dg Chest Port 1 View  Result Date: 10/12/2017 CLINICAL DATA:  Pain after fall EXAM: PORTABLE CHEST 1 VIEW COMPARISON:  August 22, 2017 FINDINGS: The heart size and mediastinal contours are within normal limits. Both lungs are clear. The visualized skeletal structures are unremarkable. IMPRESSION: No active disease. Electronically Signed   By: Dorise Bullion III M.D   On: 10/12/2017 17:26   Dg Hip Port Unilat W Or Wo Pelvis 1 View Right  Result Date: 10/13/2017 CLINICAL DATA:  Fall last night.  RIGHT hip pain. EXAM: DG HIP (WITH OR WITHOUT PELVIS) 1V PORT RIGHT COMPARISON:  None. FINDINGS: Single view of the pelvis and single view of the RIGHT hip. Osseous alignment is normal. No fracture line or displaced fracture fragment seen. No significant degenerative change at either hip joint. Soft tissues about the RIGHT hip are unremarkable. IMPRESSION: No acute findings.  No osseous fracture or dislocation seen. Electronically Signed   By: Franki Cabot M.D.   On: 10/13/2017 11:54     Labs:   Basic Metabolic Panel: Recent Labs  Lab 10/12/17 1657 10/13/17 0540 10/14/17 0639 10/15/17 0427 10/16/17 0346  NA 140 142 144 145 143  K 5.2* 4.7 3.6 3.4* 3.8  CL 104 111 114* 111 112*  CO2  --  26 26 25 26   GLUCOSE 148* 110* 102* 91 91  BUN 30* 17 8 11 10   CREATININE 1.00 0.84 0.76 0.82 0.79  CALCIUM  --  8.1* 8.3* 8.7* 8.3*  MG  --   --  1.7  --   --    GFR Estimated Creatinine Clearance: 55.5 mL/min (by C-G formula based on SCr of 0.79 mg/dL). Liver Function Tests: No results for input(s): AST, ALT, ALKPHOS, BILITOT, PROT,  ALBUMIN in the last 168 hours. No results for input(s): LIPASE, AMYLASE in the last 168 hours. No results for input(s): AMMONIA in the last 168 hours. Coagulation profile No results for input(s): INR, PROTIME in the last 168 hours.  CBC: Recent Labs  Lab 10/12/17 1643  10/13/17 0540 10/13/17 0716 10/13/17 1542 10/14/17 0639 10/15/17 0427 10/16/17 0346  WBC 12.2*  --  8.8 8.4  --  6.7 5.1 5.9  NEUTROABS 8.4*  --   --  6.5  --  4.6  --   --   HGB 9.1*   < > 6.7* 6.9* 9.1* 8.6* 8.4* 8.8*  HCT 30.2*   < > 21.4* 22.6* 28.6* 26.6* 26.0* 27.4*  MCV 92.4  --  91.1 92.2  --  88.1 89.3 89.3  PLT 192  --  140* 143*  --  132* 122* 148*   < > = values in this interval not displayed.   Cardiac Enzymes: No results for input(s): CKTOTAL, CKMB, CKMBINDEX, TROPONINI in the last 168 hours. BNP: Invalid input(s): POCBNP CBG: Recent Labs  Lab 10/14/17 0516 10/15/17 0728 10/16/17 0837  GLUCAP 93 93 97   D-Dimer No results for input(s): DDIMER in the last 72 hours. Hgb A1c No results for input(s): HGBA1C in the last 72 hours. Lipid Profile No results for input(s): CHOL, HDL, LDLCALC, TRIG, CHOLHDL, LDLDIRECT in the last 72 hours. Thyroid function studies No results for input(s): TSH, T4TOTAL, T3FREE, THYROIDAB in the last 72 hours.  Invalid input(s): FREET3 Anemia work up No results for input(s): VITAMINB12, FOLATE, FERRITIN, TIBC, IRON, RETICCTPCT in the last 72 hours. Microbiology No results found for this or any previous visit (from the  past 240 hour(s)).   Discharge Instructions:   Discharge Instructions    Diet general   Complete by:  As directed    Discharge instructions   Complete by:  As directed    maxed out assistance as patient refused SNF TED hose Discussed at length moving slowly and changing positions slowly Patient is a HIGH fall risk   Increase activity slowly   Complete by:  As directed      Allergies as of 10/16/2017      Reactions   Lipitor  [atorvastatin]    Fatigue   Prednisone Other (See Comments)   Change in mental status      Medication List    TAKE these medications   aspirin 325 MG tablet Take 325 mg by mouth daily.   buPROPion 300 MG 24 hr tablet Commonly known as:  WELLBUTRIN XL Take 1 tablet (300 mg total) by mouth daily.   clonazepam 0.125 MG disintegrating tablet Commonly known as:  KLONOPIN Take 0.125 mg by mouth at bedtime as needed for anxiety.   gabapentin 300 MG capsule Commonly known as:  NEURONTIN Take 300 mg by mouth at bedtime.   lamoTRIgine 200 MG tablet Commonly known as:  LAMICTAL Take 400 mg by mouth daily.   midodrine 5 MG tablet Commonly known as:  PROAMATINE Take 5 mg by mouth 3 (three) times daily with meals.   potassium chloride 10 MEQ tablet Commonly known as:  K-DUR Take 1 tablet (10 mEq total) by mouth daily.   rosuvastatin 20 MG tablet Commonly known as:  CRESTOR Take 20 mg by mouth daily.   sertraline 100 MG tablet Commonly known as:  ZOLOFT Take 200 mg by mouth daily.   vitamin B-1 250 MG tablet Take 250 mg by mouth 2 (two) times daily.   Vitamin D3 5000 units Caps Take 5,000 Units by mouth daily. Only take 5 days  week      Follow-up Information    Unk Pinto, MD Follow up in 1 week(s).   Specialty:  Internal Medicine Contact information: [address] [place] [address] [phone]        Nahser, Wonda Cheng, MD Follow up.   Specialty:  Cardiology Why:  event monitor Contact information: [place] [address] [phone]            Time coordinating discharge: 35 min  Signed:  Geradine Girt  [facility] 10/16/2017, 11:36 AM

## 2017-10-16 NOTE — NC FL2 (Signed)
[place] LEVEL OF CARE SCREENING TOOL     IDENTIFICATION  Patient Name: Angelica Kelley Birthdate: 1946/02/29 Sex: female Admission Date (Current Location): 10/12/2017  [facility] and [place] Number:  Herbalist and Address:  [facility], [place] [address] [place], [place] [address]      Provider Number: [id number]  Attending Physician Name and Address:  Geradine Girt, DO  Relative Name and Phone Number:  Jilda Panda - niece - [phone]; Corena Herter - brother - [phone]    Current Level of Care: Hospital Recommended Level of Care: Assisted Living Facility(From [facility] ) Prior Approval Number:    Date Approved/Denied:   PASRR Number:    Discharge Plan: Other (Comment)([facility])    Current Diagnoses: Patient Active Problem List   Diagnosis Date Noted  . Syncope 10/12/2017  . Benzodiazepine overdose 09/07/2017  . Dementia 09/07/2017  . Chronic diastolic heart failure ([place]) 09/07/2017  . Chronic anemia 09/07/2017  . Hypokalemia 08/19/2017  . Depression with anxiety 05/09/2017  . Fall 05/09/2017  . Slurred speech 05/09/2017  . CKD (chronic kidney disease) stage 2, GFR 60-89 ml/min 04/23/2017  . OAB (overactive bladder) 06/09/2014  . Vitamin D deficiency 10/27/2013  . Medication management 10/27/2013  . Hypertension   . Hyperlipidemia, mixed   . GERD (gastroesophageal reflux disease)   . Bipolar depression ([place])   . Alcoholism ([place])   . IBS (irritable bowel syndrome)   . Other abnormal glucose     Orientation RESPIRATION BLADDER Height & Weight     Self, Time, Situation, Place  Normal Continent Weight: 141 lb (64 kg) Height:  5\' 5"  (165.1 cm)  BEHAVIORAL SYMPTOMS/MOOD NEUROLOGICAL BOWEL NUTRITION STATUS      Continent Diet(Regular)  AMBULATORY STATUS COMMUNICATION OF NEEDS Skin   Limited Assist(Min guard per PT) Verbally Other (Comment)(Ecchymosis right hip)                     Personal Care Assistance Level of Assistance  Bathing, Feeding, Dressing Bathing Assistance: Limited assistance Feeding assistance: Independent(Assistance with set-up) Dressing Assistance: Limited assistance     Functional Limitations Info  Sight, Hearing, Speech Sight Info: Adequate Hearing Info: Adequate Speech Info: Adequate    SPECIAL CARE FACTORS FREQUENCY  PT (By licensed PT), OT (By licensed OT)     PT Frequency: Evaluated 6/3 and a minimum of 2X per week recommended during acute inpatient stay OT Frequency: Evaluated 6/4 and a minimum of 2X per week recommended during acute inpatient stay            Contractures Contractures Info: Not present    Additional Factors Info  Code Status, Allergies Code Status Info: Full Allergies Info: Lipitor, Prednisone           Current Medications (10/16/2017):  This is the current hospital active medication list Current Facility-Administered Medications  Medication Dose Route Frequency Provider Last Rate Last Dose  . acetaminophen (TYLENOL) tablet 650 mg  650 mg Oral Q6H PRN Etta Quill, DO   650 mg at 10/16/17 1148   Or  . acetaminophen (TYLENOL) suppository 650 mg  650 mg Rectal Q6H PRN Etta Quill, DO      . buPROPion (WELLBUTRIN XL) 24 hr tablet 300 mg  300 mg Oral Daily Jennette Kettle M, DO   300 mg at 10/16/17 6294  . clonazepam (KLONOPIN) disintegrating tablet 0.125 mg  0.125 mg Oral QHS PRN Jennette Kettle  M, DO      . gabapentin (NEURONTIN) capsule 300 mg  300 mg Oral QHS Jennette Kettle M, DO   300 mg at 10/15/17 2127  . lamoTRIgine (LAMICTAL) tablet 400 mg  400 mg Oral Daily Jennette Kettle M, DO   400 mg at 10/16/17 1443  . midodrine (PROAMATINE) tablet 5 mg  5 mg Oral TID WC Vann, Jessica U, DO   5 mg at 10/16/17 1147  . ondansetron (ZOFRAN) tablet 4 mg  4 mg Oral Q6H PRN Etta Quill, DO       Or  . ondansetron [facility]) injection 4 mg  4 mg Intravenous Q6H PRN Etta Quill, DO      .  potassium chloride (K-DUR,KLOR-CON) CR tablet 10 mEq  10 mEq Oral Daily Jennette Kettle M, DO   10 mEq at 10/16/17 0926  . rosuvastatin (CRESTOR) tablet 20 mg  20 mg Oral Daily Jennette Kettle M, DO   20 mg at 10/16/17 1540  . sertraline (ZOLOFT) tablet 200 mg  200 mg Oral Daily Jennette Kettle M, DO   200 mg at 10/16/17 0867  . sodium chloride flush (NS) 0.9 % injection 3 mL  3 mL Intravenous Q12H Jennette Kettle M, DO   3 mL at 10/16/17 1049  . thiamine (VITAMIN B-1) tablet 250 mg  250 mg Oral BID Jennette Kettle M, DO   250 mg at 10/16/17 6195     Discharge Medications: Please see discharge summary for a list of discharge medications.  Relevant Imaging Results:  Relevant Lab Results:   Additional Information Recs per OT: Recommend pt initially have 24hr supervision at time of discharge due to current. unsteadiness and decreased safety awareness. Recs per PT: she has significantly impaired short-term memory, and poor insight into safety and fall risk; If her ALF can increase services, and she can have consistent help with bathing/ADLs, and mobility, we can consider dc back to ALF. DME: rolling walker with 5' wheels and 3:1. DISCHARGE MEDICATIONS: TAKE these medications   aspirin 325 MG tablet Take 325 mg by mouth daily.   buPROPion 300 MG 24 hr tablet Commonly known as:  WELLBUTRIN XL Take 1 tablet (300 mg total) by mouth daily.   clonazepam 0.125 MG disintegrating tablet Commonly known as:  KLONOPIN Take 0.125 mg by mouth at bedtime as needed for anxiety.   gabapentin 300 MG capsule Commonly known as:  NEURONTIN Take 300 mg by mouth at bedtime.   lamoTRIgine 200 MG tablet Commonly known as:  LAMICTAL Take 400 mg by mouth daily.   midodrine 5 MG tablet Commonly known as:  PROAMATINE Take 5 mg by mouth 3 (three) times daily with meals.   potassium chloride 10 MEQ tablet Commonly known as:  K-DUR Take 1 tablet (10 mEq total) by mouth daily.   rosuvastatin 20 MG  tablet Commonly known as:  CRESTOR Take 20 mg by mouth daily.   sertraline 100 MG tablet Commonly known as:  ZOLOFT Take 200 mg by mouth daily.   vitamin B-1 250 MG tablet Take 250 mg by mouth 2 (two) times daily.   Vitamin D3 5000 units Caps Take 5,000 Units by mouth daily. Only take 5 days  week        Sharlet Salina Mila Homer, LCSW

## 2017-10-18 DIAGNOSIS — I5032 Chronic diastolic (congestive) heart failure: Secondary | ICD-10-CM | POA: Diagnosis not present

## 2017-10-18 DIAGNOSIS — Z87891 Personal history of nicotine dependence: Secondary | ICD-10-CM | POA: Diagnosis not present

## 2017-10-18 DIAGNOSIS — Z7982 Long term (current) use of aspirin: Secondary | ICD-10-CM | POA: Diagnosis not present

## 2017-10-18 DIAGNOSIS — Z9181 History of falling: Secondary | ICD-10-CM | POA: Diagnosis not present

## 2017-10-18 DIAGNOSIS — I130 Hypertensive heart and chronic kidney disease with heart failure and stage 1 through stage 4 chronic kidney disease, or unspecified chronic kidney disease: Secondary | ICD-10-CM | POA: Diagnosis not present

## 2017-10-18 DIAGNOSIS — R55 Syncope and collapse: Secondary | ICD-10-CM | POA: Diagnosis not present

## 2017-10-18 DIAGNOSIS — D631 Anemia in chronic kidney disease: Secondary | ICD-10-CM | POA: Diagnosis not present

## 2017-10-18 DIAGNOSIS — I480 Paroxysmal atrial fibrillation: Secondary | ICD-10-CM | POA: Diagnosis not present

## 2017-10-18 DIAGNOSIS — Z4802 Encounter for removal of sutures: Secondary | ICD-10-CM | POA: Diagnosis not present

## 2017-10-18 DIAGNOSIS — N182 Chronic kidney disease, stage 2 (mild): Secondary | ICD-10-CM | POA: Diagnosis not present

## 2017-10-18 DIAGNOSIS — S0101XD Laceration without foreign body of scalp, subsequent encounter: Secondary | ICD-10-CM | POA: Diagnosis not present

## 2017-10-18 DIAGNOSIS — Z8673 Personal history of transient ischemic attack (TIA), and cerebral infarction without residual deficits: Secondary | ICD-10-CM | POA: Diagnosis not present

## 2017-10-19 DIAGNOSIS — Z8673 Personal history of transient ischemic attack (TIA), and cerebral infarction without residual deficits: Secondary | ICD-10-CM | POA: Diagnosis not present

## 2017-10-19 DIAGNOSIS — I480 Paroxysmal atrial fibrillation: Secondary | ICD-10-CM | POA: Diagnosis not present

## 2017-10-19 DIAGNOSIS — S0101XD Laceration without foreign body of scalp, subsequent encounter: Secondary | ICD-10-CM | POA: Diagnosis not present

## 2017-10-19 DIAGNOSIS — I130 Hypertensive heart and chronic kidney disease with heart failure and stage 1 through stage 4 chronic kidney disease, or unspecified chronic kidney disease: Secondary | ICD-10-CM | POA: Diagnosis not present

## 2017-10-19 DIAGNOSIS — I5032 Chronic diastolic (congestive) heart failure: Secondary | ICD-10-CM | POA: Diagnosis not present

## 2017-10-19 DIAGNOSIS — Z4802 Encounter for removal of sutures: Secondary | ICD-10-CM | POA: Diagnosis not present

## 2017-10-19 DIAGNOSIS — R55 Syncope and collapse: Secondary | ICD-10-CM | POA: Diagnosis not present

## 2017-10-19 DIAGNOSIS — Z87891 Personal history of nicotine dependence: Secondary | ICD-10-CM | POA: Diagnosis not present

## 2017-10-19 DIAGNOSIS — Z7982 Long term (current) use of aspirin: Secondary | ICD-10-CM | POA: Diagnosis not present

## 2017-10-19 DIAGNOSIS — N182 Chronic kidney disease, stage 2 (mild): Secondary | ICD-10-CM | POA: Diagnosis not present

## 2017-10-19 DIAGNOSIS — Z9181 History of falling: Secondary | ICD-10-CM | POA: Diagnosis not present

## 2017-10-19 DIAGNOSIS — D631 Anemia in chronic kidney disease: Secondary | ICD-10-CM | POA: Diagnosis not present

## 2017-10-21 DIAGNOSIS — D631 Anemia in chronic kidney disease: Secondary | ICD-10-CM | POA: Diagnosis not present

## 2017-10-21 DIAGNOSIS — S0101XD Laceration without foreign body of scalp, subsequent encounter: Secondary | ICD-10-CM | POA: Diagnosis not present

## 2017-10-21 DIAGNOSIS — Z9181 History of falling: Secondary | ICD-10-CM | POA: Diagnosis not present

## 2017-10-21 DIAGNOSIS — I5032 Chronic diastolic (congestive) heart failure: Secondary | ICD-10-CM | POA: Diagnosis not present

## 2017-10-21 DIAGNOSIS — R55 Syncope and collapse: Secondary | ICD-10-CM | POA: Diagnosis not present

## 2017-10-21 DIAGNOSIS — Z7982 Long term (current) use of aspirin: Secondary | ICD-10-CM | POA: Diagnosis not present

## 2017-10-21 DIAGNOSIS — Z87891 Personal history of nicotine dependence: Secondary | ICD-10-CM | POA: Diagnosis not present

## 2017-10-21 DIAGNOSIS — Z4802 Encounter for removal of sutures: Secondary | ICD-10-CM | POA: Diagnosis not present

## 2017-10-21 DIAGNOSIS — Z8673 Personal history of transient ischemic attack (TIA), and cerebral infarction without residual deficits: Secondary | ICD-10-CM | POA: Diagnosis not present

## 2017-10-21 DIAGNOSIS — I130 Hypertensive heart and chronic kidney disease with heart failure and stage 1 through stage 4 chronic kidney disease, or unspecified chronic kidney disease: Secondary | ICD-10-CM | POA: Diagnosis not present

## 2017-10-21 DIAGNOSIS — N182 Chronic kidney disease, stage 2 (mild): Secondary | ICD-10-CM | POA: Diagnosis not present

## 2017-10-21 DIAGNOSIS — I480 Paroxysmal atrial fibrillation: Secondary | ICD-10-CM | POA: Diagnosis not present

## 2017-10-23 DIAGNOSIS — D631 Anemia in chronic kidney disease: Secondary | ICD-10-CM | POA: Diagnosis not present

## 2017-10-23 DIAGNOSIS — N182 Chronic kidney disease, stage 2 (mild): Secondary | ICD-10-CM | POA: Diagnosis not present

## 2017-10-23 DIAGNOSIS — I130 Hypertensive heart and chronic kidney disease with heart failure and stage 1 through stage 4 chronic kidney disease, or unspecified chronic kidney disease: Secondary | ICD-10-CM | POA: Diagnosis not present

## 2017-10-23 DIAGNOSIS — K5909 Other constipation: Secondary | ICD-10-CM | POA: Diagnosis not present

## 2017-10-23 DIAGNOSIS — Z8673 Personal history of transient ischemic attack (TIA), and cerebral infarction without residual deficits: Secondary | ICD-10-CM | POA: Diagnosis not present

## 2017-10-23 DIAGNOSIS — R2689 Other abnormalities of gait and mobility: Secondary | ICD-10-CM | POA: Diagnosis not present

## 2017-10-23 DIAGNOSIS — S0101XD Laceration without foreign body of scalp, subsequent encounter: Secondary | ICD-10-CM | POA: Diagnosis not present

## 2017-10-23 DIAGNOSIS — R55 Syncope and collapse: Secondary | ICD-10-CM | POA: Diagnosis not present

## 2017-10-23 DIAGNOSIS — I480 Paroxysmal atrial fibrillation: Secondary | ICD-10-CM | POA: Diagnosis not present

## 2017-10-23 DIAGNOSIS — Z4802 Encounter for removal of sutures: Secondary | ICD-10-CM | POA: Diagnosis not present

## 2017-10-23 DIAGNOSIS — Z9181 History of falling: Secondary | ICD-10-CM | POA: Diagnosis not present

## 2017-10-23 DIAGNOSIS — G8929 Other chronic pain: Secondary | ICD-10-CM | POA: Diagnosis not present

## 2017-10-23 DIAGNOSIS — Z87891 Personal history of nicotine dependence: Secondary | ICD-10-CM | POA: Diagnosis not present

## 2017-10-23 DIAGNOSIS — I5032 Chronic diastolic (congestive) heart failure: Secondary | ICD-10-CM | POA: Diagnosis not present

## 2017-10-23 DIAGNOSIS — Z7982 Long term (current) use of aspirin: Secondary | ICD-10-CM | POA: Diagnosis not present

## 2017-10-25 DIAGNOSIS — Z4802 Encounter for removal of sutures: Secondary | ICD-10-CM | POA: Diagnosis not present

## 2017-10-25 DIAGNOSIS — I5032 Chronic diastolic (congestive) heart failure: Secondary | ICD-10-CM | POA: Diagnosis not present

## 2017-10-25 DIAGNOSIS — I130 Hypertensive heart and chronic kidney disease with heart failure and stage 1 through stage 4 chronic kidney disease, or unspecified chronic kidney disease: Secondary | ICD-10-CM | POA: Diagnosis not present

## 2017-10-25 DIAGNOSIS — S0101XD Laceration without foreign body of scalp, subsequent encounter: Secondary | ICD-10-CM | POA: Diagnosis not present

## 2017-10-25 DIAGNOSIS — R55 Syncope and collapse: Secondary | ICD-10-CM | POA: Diagnosis not present

## 2017-10-25 DIAGNOSIS — Z8673 Personal history of transient ischemic attack (TIA), and cerebral infarction without residual deficits: Secondary | ICD-10-CM | POA: Diagnosis not present

## 2017-10-25 DIAGNOSIS — N182 Chronic kidney disease, stage 2 (mild): Secondary | ICD-10-CM | POA: Diagnosis not present

## 2017-10-25 DIAGNOSIS — D631 Anemia in chronic kidney disease: Secondary | ICD-10-CM | POA: Diagnosis not present

## 2017-10-25 DIAGNOSIS — I480 Paroxysmal atrial fibrillation: Secondary | ICD-10-CM | POA: Diagnosis not present

## 2017-10-25 DIAGNOSIS — Z87891 Personal history of nicotine dependence: Secondary | ICD-10-CM | POA: Diagnosis not present

## 2017-10-25 DIAGNOSIS — Z9181 History of falling: Secondary | ICD-10-CM | POA: Diagnosis not present

## 2017-10-25 DIAGNOSIS — Z7982 Long term (current) use of aspirin: Secondary | ICD-10-CM | POA: Diagnosis not present

## 2017-10-28 DIAGNOSIS — I480 Paroxysmal atrial fibrillation: Secondary | ICD-10-CM | POA: Diagnosis not present

## 2017-10-28 DIAGNOSIS — D631 Anemia in chronic kidney disease: Secondary | ICD-10-CM | POA: Diagnosis not present

## 2017-10-28 DIAGNOSIS — Z9181 History of falling: Secondary | ICD-10-CM | POA: Diagnosis not present

## 2017-10-28 DIAGNOSIS — Z4802 Encounter for removal of sutures: Secondary | ICD-10-CM | POA: Diagnosis not present

## 2017-10-28 DIAGNOSIS — Z8673 Personal history of transient ischemic attack (TIA), and cerebral infarction without residual deficits: Secondary | ICD-10-CM | POA: Diagnosis not present

## 2017-10-28 DIAGNOSIS — Z7982 Long term (current) use of aspirin: Secondary | ICD-10-CM | POA: Diagnosis not present

## 2017-10-28 DIAGNOSIS — I130 Hypertensive heart and chronic kidney disease with heart failure and stage 1 through stage 4 chronic kidney disease, or unspecified chronic kidney disease: Secondary | ICD-10-CM | POA: Diagnosis not present

## 2017-10-28 DIAGNOSIS — N182 Chronic kidney disease, stage 2 (mild): Secondary | ICD-10-CM | POA: Diagnosis not present

## 2017-10-28 DIAGNOSIS — R55 Syncope and collapse: Secondary | ICD-10-CM | POA: Diagnosis not present

## 2017-10-28 DIAGNOSIS — Z87891 Personal history of nicotine dependence: Secondary | ICD-10-CM | POA: Diagnosis not present

## 2017-10-28 DIAGNOSIS — S0101XD Laceration without foreign body of scalp, subsequent encounter: Secondary | ICD-10-CM | POA: Diagnosis not present

## 2017-10-28 DIAGNOSIS — I5032 Chronic diastolic (congestive) heart failure: Secondary | ICD-10-CM | POA: Diagnosis not present

## 2017-10-29 DIAGNOSIS — Z87891 Personal history of nicotine dependence: Secondary | ICD-10-CM | POA: Diagnosis not present

## 2017-10-29 DIAGNOSIS — Z8673 Personal history of transient ischemic attack (TIA), and cerebral infarction without residual deficits: Secondary | ICD-10-CM | POA: Diagnosis not present

## 2017-10-29 DIAGNOSIS — Z4802 Encounter for removal of sutures: Secondary | ICD-10-CM | POA: Diagnosis not present

## 2017-10-29 DIAGNOSIS — I5032 Chronic diastolic (congestive) heart failure: Secondary | ICD-10-CM | POA: Diagnosis not present

## 2017-10-29 DIAGNOSIS — Z9181 History of falling: Secondary | ICD-10-CM | POA: Diagnosis not present

## 2017-10-29 DIAGNOSIS — Z7982 Long term (current) use of aspirin: Secondary | ICD-10-CM | POA: Diagnosis not present

## 2017-10-29 DIAGNOSIS — D631 Anemia in chronic kidney disease: Secondary | ICD-10-CM | POA: Diagnosis not present

## 2017-10-29 DIAGNOSIS — I130 Hypertensive heart and chronic kidney disease with heart failure and stage 1 through stage 4 chronic kidney disease, or unspecified chronic kidney disease: Secondary | ICD-10-CM | POA: Diagnosis not present

## 2017-10-29 DIAGNOSIS — N182 Chronic kidney disease, stage 2 (mild): Secondary | ICD-10-CM | POA: Diagnosis not present

## 2017-10-29 DIAGNOSIS — R55 Syncope and collapse: Secondary | ICD-10-CM | POA: Diagnosis not present

## 2017-10-29 DIAGNOSIS — S0101XD Laceration without foreign body of scalp, subsequent encounter: Secondary | ICD-10-CM | POA: Diagnosis not present

## 2017-10-29 DIAGNOSIS — I480 Paroxysmal atrial fibrillation: Secondary | ICD-10-CM | POA: Diagnosis not present

## 2017-10-30 DIAGNOSIS — Z4802 Encounter for removal of sutures: Secondary | ICD-10-CM | POA: Diagnosis not present

## 2017-10-30 DIAGNOSIS — N182 Chronic kidney disease, stage 2 (mild): Secondary | ICD-10-CM | POA: Diagnosis not present

## 2017-10-30 DIAGNOSIS — S0101XD Laceration without foreign body of scalp, subsequent encounter: Secondary | ICD-10-CM | POA: Diagnosis not present

## 2017-10-30 DIAGNOSIS — Z7982 Long term (current) use of aspirin: Secondary | ICD-10-CM | POA: Diagnosis not present

## 2017-10-30 DIAGNOSIS — I130 Hypertensive heart and chronic kidney disease with heart failure and stage 1 through stage 4 chronic kidney disease, or unspecified chronic kidney disease: Secondary | ICD-10-CM | POA: Diagnosis not present

## 2017-10-30 DIAGNOSIS — Z9181 History of falling: Secondary | ICD-10-CM | POA: Diagnosis not present

## 2017-10-30 DIAGNOSIS — D631 Anemia in chronic kidney disease: Secondary | ICD-10-CM | POA: Diagnosis not present

## 2017-10-30 DIAGNOSIS — R55 Syncope and collapse: Secondary | ICD-10-CM | POA: Diagnosis not present

## 2017-10-30 DIAGNOSIS — Z8673 Personal history of transient ischemic attack (TIA), and cerebral infarction without residual deficits: Secondary | ICD-10-CM | POA: Diagnosis not present

## 2017-10-30 DIAGNOSIS — Z87891 Personal history of nicotine dependence: Secondary | ICD-10-CM | POA: Diagnosis not present

## 2017-10-30 DIAGNOSIS — I480 Paroxysmal atrial fibrillation: Secondary | ICD-10-CM | POA: Diagnosis not present

## 2017-10-30 DIAGNOSIS — I5032 Chronic diastolic (congestive) heart failure: Secondary | ICD-10-CM | POA: Diagnosis not present

## 2017-11-04 DIAGNOSIS — Z7982 Long term (current) use of aspirin: Secondary | ICD-10-CM | POA: Diagnosis not present

## 2017-11-04 DIAGNOSIS — N182 Chronic kidney disease, stage 2 (mild): Secondary | ICD-10-CM | POA: Diagnosis not present

## 2017-11-04 DIAGNOSIS — R55 Syncope and collapse: Secondary | ICD-10-CM | POA: Diagnosis not present

## 2017-11-04 DIAGNOSIS — I480 Paroxysmal atrial fibrillation: Secondary | ICD-10-CM | POA: Diagnosis not present

## 2017-11-04 DIAGNOSIS — Z87891 Personal history of nicotine dependence: Secondary | ICD-10-CM | POA: Diagnosis not present

## 2017-11-04 DIAGNOSIS — Z8673 Personal history of transient ischemic attack (TIA), and cerebral infarction without residual deficits: Secondary | ICD-10-CM | POA: Diagnosis not present

## 2017-11-04 DIAGNOSIS — D631 Anemia in chronic kidney disease: Secondary | ICD-10-CM | POA: Diagnosis not present

## 2017-11-04 DIAGNOSIS — Z9181 History of falling: Secondary | ICD-10-CM | POA: Diagnosis not present

## 2017-11-04 DIAGNOSIS — Z4802 Encounter for removal of sutures: Secondary | ICD-10-CM | POA: Diagnosis not present

## 2017-11-04 DIAGNOSIS — S0101XD Laceration without foreign body of scalp, subsequent encounter: Secondary | ICD-10-CM | POA: Diagnosis not present

## 2017-11-04 DIAGNOSIS — I5032 Chronic diastolic (congestive) heart failure: Secondary | ICD-10-CM | POA: Diagnosis not present

## 2017-11-04 DIAGNOSIS — I130 Hypertensive heart and chronic kidney disease with heart failure and stage 1 through stage 4 chronic kidney disease, or unspecified chronic kidney disease: Secondary | ICD-10-CM | POA: Diagnosis not present

## 2017-11-06 DIAGNOSIS — N182 Chronic kidney disease, stage 2 (mild): Secondary | ICD-10-CM | POA: Diagnosis not present

## 2017-11-06 DIAGNOSIS — I5032 Chronic diastolic (congestive) heart failure: Secondary | ICD-10-CM | POA: Diagnosis not present

## 2017-11-06 DIAGNOSIS — Z8673 Personal history of transient ischemic attack (TIA), and cerebral infarction without residual deficits: Secondary | ICD-10-CM | POA: Diagnosis not present

## 2017-11-06 DIAGNOSIS — I130 Hypertensive heart and chronic kidney disease with heart failure and stage 1 through stage 4 chronic kidney disease, or unspecified chronic kidney disease: Secondary | ICD-10-CM | POA: Diagnosis not present

## 2017-11-06 DIAGNOSIS — I480 Paroxysmal atrial fibrillation: Secondary | ICD-10-CM | POA: Diagnosis not present

## 2017-11-06 DIAGNOSIS — S0101XD Laceration without foreign body of scalp, subsequent encounter: Secondary | ICD-10-CM | POA: Diagnosis not present

## 2017-11-06 DIAGNOSIS — D631 Anemia in chronic kidney disease: Secondary | ICD-10-CM | POA: Diagnosis not present

## 2017-11-06 DIAGNOSIS — R55 Syncope and collapse: Secondary | ICD-10-CM | POA: Diagnosis not present

## 2017-11-06 DIAGNOSIS — Z7982 Long term (current) use of aspirin: Secondary | ICD-10-CM | POA: Diagnosis not present

## 2017-11-06 DIAGNOSIS — Z87891 Personal history of nicotine dependence: Secondary | ICD-10-CM | POA: Diagnosis not present

## 2017-11-06 DIAGNOSIS — Z9181 History of falling: Secondary | ICD-10-CM | POA: Diagnosis not present

## 2017-11-06 DIAGNOSIS — Z4802 Encounter for removal of sutures: Secondary | ICD-10-CM | POA: Diagnosis not present

## 2017-11-07 DIAGNOSIS — Z86010 Personal history of colonic polyps: Secondary | ICD-10-CM | POA: Diagnosis not present

## 2017-11-07 DIAGNOSIS — Z1211 Encounter for screening for malignant neoplasm of colon: Secondary | ICD-10-CM | POA: Diagnosis not present

## 2017-11-07 DIAGNOSIS — Z5309 Procedure and treatment not carried out because of other contraindication: Secondary | ICD-10-CM | POA: Diagnosis not present

## 2017-11-12 DIAGNOSIS — Z4802 Encounter for removal of sutures: Secondary | ICD-10-CM | POA: Diagnosis not present

## 2017-11-12 DIAGNOSIS — Z8673 Personal history of transient ischemic attack (TIA), and cerebral infarction without residual deficits: Secondary | ICD-10-CM | POA: Diagnosis not present

## 2017-11-12 DIAGNOSIS — Z9181 History of falling: Secondary | ICD-10-CM | POA: Diagnosis not present

## 2017-11-12 DIAGNOSIS — Z7982 Long term (current) use of aspirin: Secondary | ICD-10-CM | POA: Diagnosis not present

## 2017-11-12 DIAGNOSIS — Z87891 Personal history of nicotine dependence: Secondary | ICD-10-CM | POA: Diagnosis not present

## 2017-11-12 DIAGNOSIS — S0101XD Laceration without foreign body of scalp, subsequent encounter: Secondary | ICD-10-CM | POA: Diagnosis not present

## 2017-11-12 DIAGNOSIS — D631 Anemia in chronic kidney disease: Secondary | ICD-10-CM | POA: Diagnosis not present

## 2017-11-12 DIAGNOSIS — I480 Paroxysmal atrial fibrillation: Secondary | ICD-10-CM | POA: Diagnosis not present

## 2017-11-12 DIAGNOSIS — R55 Syncope and collapse: Secondary | ICD-10-CM | POA: Diagnosis not present

## 2017-11-12 DIAGNOSIS — N182 Chronic kidney disease, stage 2 (mild): Secondary | ICD-10-CM | POA: Diagnosis not present

## 2017-11-12 DIAGNOSIS — I130 Hypertensive heart and chronic kidney disease with heart failure and stage 1 through stage 4 chronic kidney disease, or unspecified chronic kidney disease: Secondary | ICD-10-CM | POA: Diagnosis not present

## 2017-11-12 DIAGNOSIS — I5032 Chronic diastolic (congestive) heart failure: Secondary | ICD-10-CM | POA: Diagnosis not present

## 2017-11-13 DIAGNOSIS — I10 Essential (primary) hypertension: Secondary | ICD-10-CM | POA: Diagnosis not present

## 2017-11-13 DIAGNOSIS — R1030 Lower abdominal pain, unspecified: Secondary | ICD-10-CM | POA: Diagnosis not present

## 2017-11-13 DIAGNOSIS — K5909 Other constipation: Secondary | ICD-10-CM | POA: Diagnosis not present

## 2017-11-13 DIAGNOSIS — Z79899 Other long term (current) drug therapy: Secondary | ICD-10-CM | POA: Diagnosis not present

## 2017-11-13 DIAGNOSIS — I4891 Unspecified atrial fibrillation: Secondary | ICD-10-CM | POA: Diagnosis not present

## 2017-11-13 DIAGNOSIS — K567 Ileus, unspecified: Secondary | ICD-10-CM | POA: Diagnosis not present

## 2017-11-14 DIAGNOSIS — D631 Anemia in chronic kidney disease: Secondary | ICD-10-CM | POA: Diagnosis not present

## 2017-11-14 DIAGNOSIS — Z8673 Personal history of transient ischemic attack (TIA), and cerebral infarction without residual deficits: Secondary | ICD-10-CM | POA: Diagnosis not present

## 2017-11-14 DIAGNOSIS — S0101XD Laceration without foreign body of scalp, subsequent encounter: Secondary | ICD-10-CM | POA: Diagnosis not present

## 2017-11-14 DIAGNOSIS — I130 Hypertensive heart and chronic kidney disease with heart failure and stage 1 through stage 4 chronic kidney disease, or unspecified chronic kidney disease: Secondary | ICD-10-CM | POA: Diagnosis not present

## 2017-11-14 DIAGNOSIS — N182 Chronic kidney disease, stage 2 (mild): Secondary | ICD-10-CM | POA: Diagnosis not present

## 2017-11-14 DIAGNOSIS — I480 Paroxysmal atrial fibrillation: Secondary | ICD-10-CM | POA: Diagnosis not present

## 2017-11-14 DIAGNOSIS — Z7982 Long term (current) use of aspirin: Secondary | ICD-10-CM | POA: Diagnosis not present

## 2017-11-14 DIAGNOSIS — I5032 Chronic diastolic (congestive) heart failure: Secondary | ICD-10-CM | POA: Diagnosis not present

## 2017-11-14 DIAGNOSIS — Z4802 Encounter for removal of sutures: Secondary | ICD-10-CM | POA: Diagnosis not present

## 2017-11-14 DIAGNOSIS — Z87891 Personal history of nicotine dependence: Secondary | ICD-10-CM | POA: Diagnosis not present

## 2017-11-14 DIAGNOSIS — Z9181 History of falling: Secondary | ICD-10-CM | POA: Diagnosis not present

## 2017-11-14 DIAGNOSIS — R55 Syncope and collapse: Secondary | ICD-10-CM | POA: Diagnosis not present

## 2017-11-18 ENCOUNTER — Emergency Department (HOSPITAL_COMMUNITY)
Admission: EM | Admit: 2017-11-18 | Discharge: 2017-11-18 | Disposition: A | Discharge status: Home / Self Care | Payer: Medicare Other | Attending: Emergency Medicine | Admitting: Emergency Medicine

## 2017-11-18 ENCOUNTER — Other Ambulatory Visit: Payer: Self-pay

## 2017-11-18 ENCOUNTER — Encounter (HOSPITAL_COMMUNITY): Payer: Self-pay

## 2017-11-18 DIAGNOSIS — Z87891 Personal history of nicotine dependence: Secondary | ICD-10-CM | POA: Diagnosis not present

## 2017-11-18 DIAGNOSIS — W010XXA Fall on same level from slipping, tripping and stumbling without subsequent striking against object, initial encounter: Secondary | ICD-10-CM | POA: Diagnosis not present

## 2017-11-18 DIAGNOSIS — S3991XA Unspecified injury of abdomen, initial encounter: Secondary | ICD-10-CM | POA: Diagnosis present

## 2017-11-18 DIAGNOSIS — I959 Hypotension, unspecified: Secondary | ICD-10-CM | POA: Diagnosis not present

## 2017-11-18 DIAGNOSIS — Z8673 Personal history of transient ischemic attack (TIA), and cerebral infarction without residual deficits: Secondary | ICD-10-CM | POA: Diagnosis not present

## 2017-11-18 DIAGNOSIS — Y9301 Activity, walking, marching and hiking: Secondary | ICD-10-CM | POA: Diagnosis not present

## 2017-11-18 DIAGNOSIS — R011 Cardiac murmur, unspecified: Secondary | ICD-10-CM | POA: Diagnosis not present

## 2017-11-18 DIAGNOSIS — F0390 Unspecified dementia without behavioral disturbance: Secondary | ICD-10-CM | POA: Insufficient documentation

## 2017-11-18 DIAGNOSIS — I130 Hypertensive heart and chronic kidney disease with heart failure and stage 1 through stage 4 chronic kidney disease, or unspecified chronic kidney disease: Secondary | ICD-10-CM | POA: Insufficient documentation

## 2017-11-18 DIAGNOSIS — I4891 Unspecified atrial fibrillation: Secondary | ICD-10-CM | POA: Diagnosis not present

## 2017-11-18 DIAGNOSIS — I5032 Chronic diastolic (congestive) heart failure: Secondary | ICD-10-CM | POA: Diagnosis not present

## 2017-11-18 DIAGNOSIS — Y92129 Unspecified place in nursing home as the place of occurrence of the external cause: Secondary | ICD-10-CM | POA: Insufficient documentation

## 2017-11-18 DIAGNOSIS — Y998 Other external cause status: Secondary | ICD-10-CM | POA: Diagnosis not present

## 2017-11-18 DIAGNOSIS — K5900 Constipation, unspecified: Secondary | ICD-10-CM | POA: Diagnosis not present

## 2017-11-18 DIAGNOSIS — Z7982 Long term (current) use of aspirin: Secondary | ICD-10-CM | POA: Diagnosis not present

## 2017-11-18 DIAGNOSIS — R1084 Generalized abdominal pain: Secondary | ICD-10-CM | POA: Diagnosis not present

## 2017-11-18 DIAGNOSIS — N182 Chronic kidney disease, stage 2 (mild): Secondary | ICD-10-CM | POA: Diagnosis not present

## 2017-11-18 DIAGNOSIS — M542 Cervicalgia: Secondary | ICD-10-CM | POA: Diagnosis not present

## 2017-11-18 DIAGNOSIS — R52 Pain, unspecified: Secondary | ICD-10-CM | POA: Diagnosis not present

## 2017-11-18 DIAGNOSIS — S301XXA Contusion of abdominal wall, initial encounter: Secondary | ICD-10-CM | POA: Diagnosis not present

## 2017-11-18 DIAGNOSIS — I10 Essential (primary) hypertension: Secondary | ICD-10-CM | POA: Diagnosis not present

## 2017-11-18 DIAGNOSIS — W19XXXA Unspecified fall, initial encounter: Secondary | ICD-10-CM | POA: Diagnosis not present

## 2017-11-18 LAB — COMPREHENSIVE METABOLIC PANEL
ALT: 14 U/L (ref 0–44)
AST: 17 U/L (ref 15–41)
Albumin: 4.1 g/dL (ref 3.5–5.0)
Alkaline Phosphatase: 64 U/L (ref 38–126)
Anion gap: 10 (ref 5–15)
BILIRUBIN TOTAL: 0.4 mg/dL (ref 0.3–1.2)
BUN: 23 mg/dL (ref 8–23)
CHLORIDE: 104 mmol/L (ref 98–111)
CO2: 28 mmol/L (ref 22–32)
CREATININE: 1.18 mg/dL — AB (ref 0.44–1.00)
Calcium: 9.5 mg/dL (ref 8.9–10.3)
GFR, EST AFRICAN AMERICAN: 51 mL/min — AB (ref >60)
GFR, EST NON AFRICAN AMERICAN: 44 mL/min — AB (ref >60)
Glucose, Bld: 107 mg/dL — ABNORMAL HIGH (ref 70–99)
Potassium: 4.3 mmol/L (ref 3.5–5.1)
Sodium: 142 mmol/L (ref 135–145)
TOTAL PROTEIN: 6.7 g/dL (ref 6.5–8.1)

## 2017-11-18 LAB — URINALYSIS, ROUTINE W REFLEX MICROSCOPIC
BILIRUBIN URINE: NEGATIVE
GLUCOSE, UA: NEGATIVE mg/dL
HGB URINE DIPSTICK: NEGATIVE
Ketones, ur: NEGATIVE mg/dL
Leukocytes, UA: NEGATIVE
NITRITE: NEGATIVE
Protein, ur: NEGATIVE mg/dL
Specific Gravity, Urine: 1.006 (ref 1.005–1.030)
pH: 7 (ref 5.0–8.0)

## 2017-11-18 LAB — CBC WITH DIFFERENTIAL/PLATELET
Basophils Absolute: 0 10*3/uL (ref 0.0–0.1)
Basophils Relative: 1 %
EOS PCT: 2 %
Eosinophils Absolute: 0.1 10*3/uL (ref 0.0–0.7)
HEMATOCRIT: 33.9 % — AB (ref 36.0–46.0)
Hemoglobin: 10.8 g/dL — ABNORMAL LOW (ref 12.0–15.0)
LYMPHS ABS: 1.1 10*3/uL (ref 0.7–4.0)
Lymphocytes Relative: 18 %
MCH: 28.9 pg (ref 26.0–34.0)
MCHC: 31.9 g/dL (ref 30.0–36.0)
MCV: 90.6 fL (ref 78.0–100.0)
MONO ABS: 0.5 10*3/uL (ref 0.1–1.0)
Monocytes Relative: 9 %
Neutro Abs: 4.1 10*3/uL (ref 1.7–7.7)
Neutrophils Relative %: 70 %
PLATELETS: 197 10*3/uL (ref 150–400)
RBC: 3.74 MIL/uL — ABNORMAL LOW (ref 3.87–5.11)
RDW: 14 % (ref 11.5–15.5)
WBC: 5.9 10*3/uL (ref 4.0–10.5)

## 2017-11-18 MED ORDER — SODIUM CHLORIDE 0.9 % IV BOLUS
500.0000 mL | Freq: Once | INTRAVENOUS | Status: AC
  Administered 2017-11-18: 500 mL via INTRAVENOUS

## 2017-11-18 NOTE — ED Notes (Signed)
Pt ambulatory with steady gait using walker to restroom and back to treatment room

## 2017-11-18 NOTE — ED Notes (Signed)
Pt niece libby en route to ED to pick up patinent
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
## 2017-11-18 NOTE — ED Notes (Signed)
Per [facility] triage RN, hematoma on pt's right lateral side has increased in size since her arrival to our ED.

## 2017-11-18 NOTE — ED Notes (Signed)
Bed: VM99 Expected date:  Expected time:  Means of arrival:  Comments: Hold for triage 1

## 2017-11-18 NOTE — ED Triage Notes (Signed)
Per EMS- Patient is a resident of [facility]. Patient was walking with her walker and fell hitting her head. EMS placed a c-collar on. Patient c/o neck pain and has a large hematoma to the right side. Patient is on blood thinners.

## 2017-11-19 ENCOUNTER — Ambulatory Visit: Payer: Self-pay | Admitting: Adult Health

## 2017-11-20 DIAGNOSIS — S0101XD Laceration without foreign body of scalp, subsequent encounter: Secondary | ICD-10-CM | POA: Diagnosis not present

## 2017-11-20 DIAGNOSIS — I130 Hypertensive heart and chronic kidney disease with heart failure and stage 1 through stage 4 chronic kidney disease, or unspecified chronic kidney disease: Secondary | ICD-10-CM | POA: Diagnosis not present

## 2017-11-20 DIAGNOSIS — S51001S Unspecified open wound of right elbow, sequela: Secondary | ICD-10-CM | POA: Diagnosis not present

## 2017-11-20 DIAGNOSIS — Z7982 Long term (current) use of aspirin: Secondary | ICD-10-CM | POA: Diagnosis not present

## 2017-11-20 DIAGNOSIS — Z8673 Personal history of transient ischemic attack (TIA), and cerebral infarction without residual deficits: Secondary | ICD-10-CM | POA: Diagnosis not present

## 2017-11-20 DIAGNOSIS — M7981 Nontraumatic hematoma of soft tissue: Secondary | ICD-10-CM | POA: Diagnosis not present

## 2017-11-20 DIAGNOSIS — K567 Ileus, unspecified: Secondary | ICD-10-CM | POA: Diagnosis not present

## 2017-11-20 DIAGNOSIS — Z4802 Encounter for removal of sutures: Secondary | ICD-10-CM | POA: Diagnosis not present

## 2017-11-20 DIAGNOSIS — Z79899 Other long term (current) drug therapy: Secondary | ICD-10-CM | POA: Diagnosis not present

## 2017-11-20 DIAGNOSIS — R55 Syncope and collapse: Secondary | ICD-10-CM | POA: Diagnosis not present

## 2017-11-20 DIAGNOSIS — Z87891 Personal history of nicotine dependence: Secondary | ICD-10-CM | POA: Diagnosis not present

## 2017-11-20 DIAGNOSIS — I5032 Chronic diastolic (congestive) heart failure: Secondary | ICD-10-CM | POA: Diagnosis not present

## 2017-11-20 DIAGNOSIS — N390 Urinary tract infection, site not specified: Secondary | ICD-10-CM | POA: Diagnosis not present

## 2017-11-20 DIAGNOSIS — Z9181 History of falling: Secondary | ICD-10-CM | POA: Diagnosis not present

## 2017-11-20 DIAGNOSIS — D631 Anemia in chronic kidney disease: Secondary | ICD-10-CM | POA: Diagnosis not present

## 2017-11-20 DIAGNOSIS — I480 Paroxysmal atrial fibrillation: Secondary | ICD-10-CM | POA: Diagnosis not present

## 2017-11-20 DIAGNOSIS — N182 Chronic kidney disease, stage 2 (mild): Secondary | ICD-10-CM | POA: Diagnosis not present

## 2017-11-25 DIAGNOSIS — I10 Essential (primary) hypertension: Secondary | ICD-10-CM | POA: Diagnosis not present

## 2017-11-25 DIAGNOSIS — I4891 Unspecified atrial fibrillation: Secondary | ICD-10-CM | POA: Diagnosis not present

## 2017-11-27 DIAGNOSIS — E7849 Other hyperlipidemia: Secondary | ICD-10-CM | POA: Diagnosis not present

## 2017-11-27 DIAGNOSIS — D509 Iron deficiency anemia, unspecified: Secondary | ICD-10-CM | POA: Diagnosis not present

## 2017-11-27 DIAGNOSIS — I10 Essential (primary) hypertension: Secondary | ICD-10-CM | POA: Diagnosis not present

## 2017-12-19 ENCOUNTER — Encounter: Payer: Self-pay | Admitting: Internal Medicine

## 2017-12-25 DIAGNOSIS — H6120 Impacted cerumen, unspecified ear: Secondary | ICD-10-CM | POA: Diagnosis not present

## 2017-12-25 DIAGNOSIS — M25512 Pain in left shoulder: Secondary | ICD-10-CM | POA: Diagnosis not present

## 2017-12-25 DIAGNOSIS — R2689 Other abnormalities of gait and mobility: Secondary | ICD-10-CM | POA: Diagnosis not present

## 2017-12-25 DIAGNOSIS — I10 Essential (primary) hypertension: Secondary | ICD-10-CM | POA: Diagnosis not present

## 2017-12-27 DIAGNOSIS — I10 Essential (primary) hypertension: Secondary | ICD-10-CM | POA: Diagnosis not present

## 2017-12-27 DIAGNOSIS — R2681 Unsteadiness on feet: Secondary | ICD-10-CM | POA: Diagnosis not present

## 2017-12-27 DIAGNOSIS — E7849 Other hyperlipidemia: Secondary | ICD-10-CM | POA: Diagnosis not present

## 2018-01-01 DIAGNOSIS — M19012 Primary osteoarthritis, left shoulder: Secondary | ICD-10-CM | POA: Diagnosis not present

## 2018-01-01 DIAGNOSIS — E7849 Other hyperlipidemia: Secondary | ICD-10-CM | POA: Diagnosis not present

## 2018-01-01 DIAGNOSIS — E559 Vitamin D deficiency, unspecified: Secondary | ICD-10-CM | POA: Diagnosis not present

## 2018-01-01 DIAGNOSIS — I10 Essential (primary) hypertension: Secondary | ICD-10-CM | POA: Diagnosis not present

## 2018-01-03 DIAGNOSIS — M25512 Pain in left shoulder: Secondary | ICD-10-CM | POA: Diagnosis not present

## 2018-01-03 DIAGNOSIS — N182 Chronic kidney disease, stage 2 (mild): Secondary | ICD-10-CM | POA: Diagnosis not present

## 2018-01-03 DIAGNOSIS — I130 Hypertensive heart and chronic kidney disease with heart failure and stage 1 through stage 4 chronic kidney disease, or unspecified chronic kidney disease: Secondary | ICD-10-CM | POA: Diagnosis not present

## 2018-01-03 DIAGNOSIS — D631 Anemia in chronic kidney disease: Secondary | ICD-10-CM | POA: Diagnosis not present

## 2018-01-03 DIAGNOSIS — Z7982 Long term (current) use of aspirin: Secondary | ICD-10-CM | POA: Diagnosis not present

## 2018-01-03 DIAGNOSIS — Z9181 History of falling: Secondary | ICD-10-CM | POA: Diagnosis not present

## 2018-01-03 DIAGNOSIS — I5032 Chronic diastolic (congestive) heart failure: Secondary | ICD-10-CM | POA: Diagnosis not present

## 2018-01-03 DIAGNOSIS — R2681 Unsteadiness on feet: Secondary | ICD-10-CM | POA: Diagnosis not present

## 2018-01-06 DIAGNOSIS — M25519 Pain in unspecified shoulder: Secondary | ICD-10-CM | POA: Diagnosis not present

## 2018-01-06 DIAGNOSIS — I10 Essential (primary) hypertension: Secondary | ICD-10-CM | POA: Diagnosis not present

## 2018-01-06 DIAGNOSIS — E7849 Other hyperlipidemia: Secondary | ICD-10-CM | POA: Diagnosis not present

## 2018-01-06 DIAGNOSIS — E559 Vitamin D deficiency, unspecified: Secondary | ICD-10-CM | POA: Diagnosis not present

## 2018-01-07 DIAGNOSIS — R2681 Unsteadiness on feet: Secondary | ICD-10-CM | POA: Diagnosis not present

## 2018-01-07 DIAGNOSIS — N182 Chronic kidney disease, stage 2 (mild): Secondary | ICD-10-CM | POA: Diagnosis not present

## 2018-01-07 DIAGNOSIS — Z7982 Long term (current) use of aspirin: Secondary | ICD-10-CM | POA: Diagnosis not present

## 2018-01-07 DIAGNOSIS — Z9181 History of falling: Secondary | ICD-10-CM | POA: Diagnosis not present

## 2018-01-07 DIAGNOSIS — I5032 Chronic diastolic (congestive) heart failure: Secondary | ICD-10-CM | POA: Diagnosis not present

## 2018-01-07 DIAGNOSIS — M25512 Pain in left shoulder: Secondary | ICD-10-CM | POA: Diagnosis not present

## 2018-01-07 DIAGNOSIS — I130 Hypertensive heart and chronic kidney disease with heart failure and stage 1 through stage 4 chronic kidney disease, or unspecified chronic kidney disease: Secondary | ICD-10-CM | POA: Diagnosis not present

## 2018-01-07 DIAGNOSIS — D631 Anemia in chronic kidney disease: Secondary | ICD-10-CM | POA: Diagnosis not present

## 2018-01-08 DIAGNOSIS — G629 Polyneuropathy, unspecified: Secondary | ICD-10-CM | POA: Diagnosis not present

## 2018-01-08 DIAGNOSIS — R2689 Other abnormalities of gait and mobility: Secondary | ICD-10-CM | POA: Diagnosis not present

## 2018-01-08 DIAGNOSIS — E876 Hypokalemia: Secondary | ICD-10-CM | POA: Diagnosis not present

## 2018-01-09 DIAGNOSIS — H6121 Impacted cerumen, right ear: Secondary | ICD-10-CM | POA: Diagnosis not present

## 2018-01-10 DIAGNOSIS — I5032 Chronic diastolic (congestive) heart failure: Secondary | ICD-10-CM | POA: Diagnosis not present

## 2018-01-10 DIAGNOSIS — N182 Chronic kidney disease, stage 2 (mild): Secondary | ICD-10-CM | POA: Diagnosis not present

## 2018-01-10 DIAGNOSIS — M25512 Pain in left shoulder: Secondary | ICD-10-CM | POA: Diagnosis not present

## 2018-01-10 DIAGNOSIS — Z9181 History of falling: Secondary | ICD-10-CM | POA: Diagnosis not present

## 2018-01-10 DIAGNOSIS — R2681 Unsteadiness on feet: Secondary | ICD-10-CM | POA: Diagnosis not present

## 2018-01-10 DIAGNOSIS — I130 Hypertensive heart and chronic kidney disease with heart failure and stage 1 through stage 4 chronic kidney disease, or unspecified chronic kidney disease: Secondary | ICD-10-CM | POA: Diagnosis not present

## 2018-01-10 DIAGNOSIS — Z7982 Long term (current) use of aspirin: Secondary | ICD-10-CM | POA: Diagnosis not present

## 2018-01-10 DIAGNOSIS — D631 Anemia in chronic kidney disease: Secondary | ICD-10-CM | POA: Diagnosis not present

## 2018-01-14 DIAGNOSIS — M25512 Pain in left shoulder: Secondary | ICD-10-CM | POA: Diagnosis not present

## 2018-01-14 DIAGNOSIS — Z7982 Long term (current) use of aspirin: Secondary | ICD-10-CM | POA: Diagnosis not present

## 2018-01-14 DIAGNOSIS — R2681 Unsteadiness on feet: Secondary | ICD-10-CM | POA: Diagnosis not present

## 2018-01-14 DIAGNOSIS — I5032 Chronic diastolic (congestive) heart failure: Secondary | ICD-10-CM | POA: Diagnosis not present

## 2018-01-14 DIAGNOSIS — D631 Anemia in chronic kidney disease: Secondary | ICD-10-CM | POA: Diagnosis not present

## 2018-01-14 DIAGNOSIS — E559 Vitamin D deficiency, unspecified: Secondary | ICD-10-CM | POA: Diagnosis not present

## 2018-01-14 DIAGNOSIS — I130 Hypertensive heart and chronic kidney disease with heart failure and stage 1 through stage 4 chronic kidney disease, or unspecified chronic kidney disease: Secondary | ICD-10-CM | POA: Diagnosis not present

## 2018-01-14 DIAGNOSIS — N182 Chronic kidney disease, stage 2 (mild): Secondary | ICD-10-CM | POA: Diagnosis not present

## 2018-01-14 DIAGNOSIS — Z9181 History of falling: Secondary | ICD-10-CM | POA: Diagnosis not present

## 2018-01-14 DIAGNOSIS — Z79899 Other long term (current) drug therapy: Secondary | ICD-10-CM | POA: Diagnosis not present

## 2018-01-15 DIAGNOSIS — J3489 Other specified disorders of nose and nasal sinuses: Secondary | ICD-10-CM | POA: Diagnosis not present

## 2018-01-15 DIAGNOSIS — E559 Vitamin D deficiency, unspecified: Secondary | ICD-10-CM | POA: Diagnosis not present

## 2018-01-15 DIAGNOSIS — I10 Essential (primary) hypertension: Secondary | ICD-10-CM | POA: Diagnosis not present

## 2018-01-16 DIAGNOSIS — M25512 Pain in left shoulder: Secondary | ICD-10-CM | POA: Diagnosis not present

## 2018-01-16 DIAGNOSIS — D631 Anemia in chronic kidney disease: Secondary | ICD-10-CM | POA: Diagnosis not present

## 2018-01-16 DIAGNOSIS — I5032 Chronic diastolic (congestive) heart failure: Secondary | ICD-10-CM | POA: Diagnosis not present

## 2018-01-16 DIAGNOSIS — Z9181 History of falling: Secondary | ICD-10-CM | POA: Diagnosis not present

## 2018-01-16 DIAGNOSIS — Z7982 Long term (current) use of aspirin: Secondary | ICD-10-CM | POA: Diagnosis not present

## 2018-01-16 DIAGNOSIS — R2681 Unsteadiness on feet: Secondary | ICD-10-CM | POA: Diagnosis not present

## 2018-01-16 DIAGNOSIS — I130 Hypertensive heart and chronic kidney disease with heart failure and stage 1 through stage 4 chronic kidney disease, or unspecified chronic kidney disease: Secondary | ICD-10-CM | POA: Diagnosis not present

## 2018-01-16 DIAGNOSIS — N182 Chronic kidney disease, stage 2 (mild): Secondary | ICD-10-CM | POA: Diagnosis not present

## 2018-01-20 DIAGNOSIS — I130 Hypertensive heart and chronic kidney disease with heart failure and stage 1 through stage 4 chronic kidney disease, or unspecified chronic kidney disease: Secondary | ICD-10-CM | POA: Diagnosis not present

## 2018-01-20 DIAGNOSIS — N182 Chronic kidney disease, stage 2 (mild): Secondary | ICD-10-CM | POA: Diagnosis not present

## 2018-01-20 DIAGNOSIS — I5032 Chronic diastolic (congestive) heart failure: Secondary | ICD-10-CM | POA: Diagnosis not present

## 2018-01-20 DIAGNOSIS — M25512 Pain in left shoulder: Secondary | ICD-10-CM | POA: Diagnosis not present

## 2018-01-20 DIAGNOSIS — Z9181 History of falling: Secondary | ICD-10-CM | POA: Diagnosis not present

## 2018-01-20 DIAGNOSIS — Z7982 Long term (current) use of aspirin: Secondary | ICD-10-CM | POA: Diagnosis not present

## 2018-01-20 DIAGNOSIS — R2681 Unsteadiness on feet: Secondary | ICD-10-CM | POA: Diagnosis not present

## 2018-01-20 DIAGNOSIS — D631 Anemia in chronic kidney disease: Secondary | ICD-10-CM | POA: Diagnosis not present

## 2018-01-22 DIAGNOSIS — Z79899 Other long term (current) drug therapy: Secondary | ICD-10-CM | POA: Diagnosis not present

## 2018-01-22 DIAGNOSIS — E7849 Other hyperlipidemia: Secondary | ICD-10-CM | POA: Diagnosis not present

## 2018-01-22 DIAGNOSIS — R350 Frequency of micturition: Secondary | ICD-10-CM | POA: Diagnosis not present

## 2018-01-22 DIAGNOSIS — I10 Essential (primary) hypertension: Secondary | ICD-10-CM | POA: Diagnosis not present

## 2018-01-23 DIAGNOSIS — N390 Urinary tract infection, site not specified: Secondary | ICD-10-CM | POA: Diagnosis not present

## 2018-01-24 DIAGNOSIS — D631 Anemia in chronic kidney disease: Secondary | ICD-10-CM | POA: Diagnosis not present

## 2018-01-24 DIAGNOSIS — I130 Hypertensive heart and chronic kidney disease with heart failure and stage 1 through stage 4 chronic kidney disease, or unspecified chronic kidney disease: Secondary | ICD-10-CM | POA: Diagnosis not present

## 2018-01-24 DIAGNOSIS — Z7982 Long term (current) use of aspirin: Secondary | ICD-10-CM | POA: Diagnosis not present

## 2018-01-24 DIAGNOSIS — R2681 Unsteadiness on feet: Secondary | ICD-10-CM | POA: Diagnosis not present

## 2018-01-24 DIAGNOSIS — N182 Chronic kidney disease, stage 2 (mild): Secondary | ICD-10-CM | POA: Diagnosis not present

## 2018-01-24 DIAGNOSIS — R55 Syncope and collapse: Secondary | ICD-10-CM | POA: Diagnosis not present

## 2018-01-24 DIAGNOSIS — W19XXXD Unspecified fall, subsequent encounter: Secondary | ICD-10-CM | POA: Diagnosis not present

## 2018-01-24 DIAGNOSIS — Z9181 History of falling: Secondary | ICD-10-CM | POA: Diagnosis not present

## 2018-01-24 DIAGNOSIS — I5032 Chronic diastolic (congestive) heart failure: Secondary | ICD-10-CM | POA: Diagnosis not present

## 2018-01-24 DIAGNOSIS — M25512 Pain in left shoulder: Secondary | ICD-10-CM | POA: Diagnosis not present

## 2018-01-27 DIAGNOSIS — R2681 Unsteadiness on feet: Secondary | ICD-10-CM | POA: Diagnosis not present

## 2018-01-27 DIAGNOSIS — I130 Hypertensive heart and chronic kidney disease with heart failure and stage 1 through stage 4 chronic kidney disease, or unspecified chronic kidney disease: Secondary | ICD-10-CM | POA: Diagnosis not present

## 2018-01-27 DIAGNOSIS — Z7982 Long term (current) use of aspirin: Secondary | ICD-10-CM | POA: Diagnosis not present

## 2018-01-27 DIAGNOSIS — N182 Chronic kidney disease, stage 2 (mild): Secondary | ICD-10-CM | POA: Diagnosis not present

## 2018-01-27 DIAGNOSIS — M25512 Pain in left shoulder: Secondary | ICD-10-CM | POA: Diagnosis not present

## 2018-01-27 DIAGNOSIS — Z9181 History of falling: Secondary | ICD-10-CM | POA: Diagnosis not present

## 2018-01-27 DIAGNOSIS — D631 Anemia in chronic kidney disease: Secondary | ICD-10-CM | POA: Diagnosis not present

## 2018-01-27 DIAGNOSIS — I5032 Chronic diastolic (congestive) heart failure: Secondary | ICD-10-CM | POA: Diagnosis not present

## 2018-01-29 DIAGNOSIS — I951 Orthostatic hypotension: Secondary | ICD-10-CM | POA: Diagnosis not present

## 2018-01-29 DIAGNOSIS — M25512 Pain in left shoulder: Secondary | ICD-10-CM | POA: Diagnosis not present

## 2018-01-29 DIAGNOSIS — N390 Urinary tract infection, site not specified: Secondary | ICD-10-CM | POA: Diagnosis not present

## 2018-01-29 DIAGNOSIS — Z79899 Other long term (current) drug therapy: Secondary | ICD-10-CM | POA: Diagnosis not present

## 2018-01-31 DIAGNOSIS — Z7982 Long term (current) use of aspirin: Secondary | ICD-10-CM | POA: Diagnosis not present

## 2018-01-31 DIAGNOSIS — N182 Chronic kidney disease, stage 2 (mild): Secondary | ICD-10-CM | POA: Diagnosis not present

## 2018-01-31 DIAGNOSIS — I130 Hypertensive heart and chronic kidney disease with heart failure and stage 1 through stage 4 chronic kidney disease, or unspecified chronic kidney disease: Secondary | ICD-10-CM | POA: Diagnosis not present

## 2018-01-31 DIAGNOSIS — R2681 Unsteadiness on feet: Secondary | ICD-10-CM | POA: Diagnosis not present

## 2018-01-31 DIAGNOSIS — Z9181 History of falling: Secondary | ICD-10-CM | POA: Diagnosis not present

## 2018-01-31 DIAGNOSIS — M25512 Pain in left shoulder: Secondary | ICD-10-CM | POA: Diagnosis not present

## 2018-01-31 DIAGNOSIS — D631 Anemia in chronic kidney disease: Secondary | ICD-10-CM | POA: Diagnosis not present

## 2018-01-31 DIAGNOSIS — I5032 Chronic diastolic (congestive) heart failure: Secondary | ICD-10-CM | POA: Diagnosis not present

## 2018-02-04 DIAGNOSIS — N182 Chronic kidney disease, stage 2 (mild): Secondary | ICD-10-CM | POA: Diagnosis not present

## 2018-02-04 DIAGNOSIS — H40033 Anatomical narrow angle, bilateral: Secondary | ICD-10-CM | POA: Diagnosis not present

## 2018-02-04 DIAGNOSIS — I5032 Chronic diastolic (congestive) heart failure: Secondary | ICD-10-CM | POA: Diagnosis not present

## 2018-02-04 DIAGNOSIS — H25013 Cortical age-related cataract, bilateral: Secondary | ICD-10-CM | POA: Diagnosis not present

## 2018-02-04 DIAGNOSIS — H2513 Age-related nuclear cataract, bilateral: Secondary | ICD-10-CM | POA: Diagnosis not present

## 2018-02-04 DIAGNOSIS — Z7982 Long term (current) use of aspirin: Secondary | ICD-10-CM | POA: Diagnosis not present

## 2018-02-04 DIAGNOSIS — M25512 Pain in left shoulder: Secondary | ICD-10-CM | POA: Diagnosis not present

## 2018-02-04 DIAGNOSIS — D631 Anemia in chronic kidney disease: Secondary | ICD-10-CM | POA: Diagnosis not present

## 2018-02-04 DIAGNOSIS — I130 Hypertensive heart and chronic kidney disease with heart failure and stage 1 through stage 4 chronic kidney disease, or unspecified chronic kidney disease: Secondary | ICD-10-CM | POA: Diagnosis not present

## 2018-02-04 DIAGNOSIS — R2681 Unsteadiness on feet: Secondary | ICD-10-CM | POA: Diagnosis not present

## 2018-02-04 DIAGNOSIS — Z9181 History of falling: Secondary | ICD-10-CM | POA: Diagnosis not present

## 2018-02-04 DIAGNOSIS — H40013 Open angle with borderline findings, low risk, bilateral: Secondary | ICD-10-CM | POA: Diagnosis not present

## 2018-02-04 LAB — HM DIABETES EYE EXAM

## 2018-02-05 DIAGNOSIS — I10 Essential (primary) hypertension: Secondary | ICD-10-CM | POA: Diagnosis not present

## 2018-02-05 DIAGNOSIS — E876 Hypokalemia: Secondary | ICD-10-CM | POA: Diagnosis not present

## 2018-02-06 ENCOUNTER — Encounter: Payer: Self-pay | Admitting: *Deleted

## 2018-02-11 DIAGNOSIS — M25512 Pain in left shoulder: Secondary | ICD-10-CM | POA: Diagnosis not present

## 2018-02-19 ENCOUNTER — Ambulatory Visit: Payer: Self-pay | Admitting: Internal Medicine

## 2018-02-19 DIAGNOSIS — R300 Dysuria: Secondary | ICD-10-CM | POA: Diagnosis not present

## 2018-02-19 DIAGNOSIS — M19012 Primary osteoarthritis, left shoulder: Secondary | ICD-10-CM | POA: Diagnosis not present

## 2018-02-19 DIAGNOSIS — I10 Essential (primary) hypertension: Secondary | ICD-10-CM | POA: Diagnosis not present

## 2018-02-19 DIAGNOSIS — H04123 Dry eye syndrome of bilateral lacrimal glands: Secondary | ICD-10-CM | POA: Diagnosis not present

## 2018-02-21 DIAGNOSIS — R350 Frequency of micturition: Secondary | ICD-10-CM | POA: Diagnosis not present

## 2018-03-05 DIAGNOSIS — I509 Heart failure, unspecified: Secondary | ICD-10-CM | POA: Diagnosis not present

## 2018-03-05 DIAGNOSIS — I4891 Unspecified atrial fibrillation: Secondary | ICD-10-CM | POA: Diagnosis not present

## 2018-03-10 DIAGNOSIS — I509 Heart failure, unspecified: Secondary | ICD-10-CM | POA: Diagnosis not present

## 2018-03-10 DIAGNOSIS — I10 Essential (primary) hypertension: Secondary | ICD-10-CM | POA: Diagnosis not present

## 2018-03-10 DIAGNOSIS — G308 Other Alzheimer's disease: Secondary | ICD-10-CM | POA: Diagnosis not present

## 2018-03-18 DIAGNOSIS — E7849 Other hyperlipidemia: Secondary | ICD-10-CM | POA: Diagnosis not present

## 2018-03-19 DIAGNOSIS — I10 Essential (primary) hypertension: Secondary | ICD-10-CM | POA: Diagnosis not present

## 2018-03-19 DIAGNOSIS — E7849 Other hyperlipidemia: Secondary | ICD-10-CM | POA: Diagnosis not present

## 2018-03-19 DIAGNOSIS — G308 Other Alzheimer's disease: Secondary | ICD-10-CM | POA: Diagnosis not present

## 2018-03-19 DIAGNOSIS — E876 Hypokalemia: Secondary | ICD-10-CM | POA: Diagnosis not present

## 2018-03-20 DIAGNOSIS — G308 Other Alzheimer's disease: Secondary | ICD-10-CM | POA: Diagnosis not present

## 2018-03-21 DIAGNOSIS — Z7982 Long term (current) use of aspirin: Secondary | ICD-10-CM | POA: Diagnosis not present

## 2018-03-21 DIAGNOSIS — G629 Polyneuropathy, unspecified: Secondary | ICD-10-CM | POA: Diagnosis not present

## 2018-03-21 DIAGNOSIS — I5032 Chronic diastolic (congestive) heart failure: Secondary | ICD-10-CM | POA: Diagnosis not present

## 2018-03-21 DIAGNOSIS — G308 Other Alzheimer's disease: Secondary | ICD-10-CM | POA: Diagnosis not present

## 2018-03-21 DIAGNOSIS — I130 Hypertensive heart and chronic kidney disease with heart failure and stage 1 through stage 4 chronic kidney disease, or unspecified chronic kidney disease: Secondary | ICD-10-CM | POA: Diagnosis not present

## 2018-03-21 DIAGNOSIS — Z9181 History of falling: Secondary | ICD-10-CM | POA: Diagnosis not present

## 2018-03-21 DIAGNOSIS — D631 Anemia in chronic kidney disease: Secondary | ICD-10-CM | POA: Diagnosis not present

## 2018-03-21 DIAGNOSIS — N182 Chronic kidney disease, stage 2 (mild): Secondary | ICD-10-CM | POA: Diagnosis not present

## 2018-03-25 DIAGNOSIS — Z9181 History of falling: Secondary | ICD-10-CM | POA: Diagnosis not present

## 2018-03-25 DIAGNOSIS — G308 Other Alzheimer's disease: Secondary | ICD-10-CM | POA: Diagnosis not present

## 2018-03-25 DIAGNOSIS — G629 Polyneuropathy, unspecified: Secondary | ICD-10-CM | POA: Diagnosis not present

## 2018-03-25 DIAGNOSIS — I5032 Chronic diastolic (congestive) heart failure: Secondary | ICD-10-CM | POA: Diagnosis not present

## 2018-03-25 DIAGNOSIS — N182 Chronic kidney disease, stage 2 (mild): Secondary | ICD-10-CM | POA: Diagnosis not present

## 2018-03-25 DIAGNOSIS — D631 Anemia in chronic kidney disease: Secondary | ICD-10-CM | POA: Diagnosis not present

## 2018-03-25 DIAGNOSIS — Z7982 Long term (current) use of aspirin: Secondary | ICD-10-CM | POA: Diagnosis not present

## 2018-03-25 DIAGNOSIS — I130 Hypertensive heart and chronic kidney disease with heart failure and stage 1 through stage 4 chronic kidney disease, or unspecified chronic kidney disease: Secondary | ICD-10-CM | POA: Diagnosis not present

## 2018-03-26 DIAGNOSIS — K5909 Other constipation: Secondary | ICD-10-CM | POA: Diagnosis not present

## 2018-03-26 DIAGNOSIS — G629 Polyneuropathy, unspecified: Secondary | ICD-10-CM | POA: Diagnosis not present

## 2018-03-28 DIAGNOSIS — G308 Other Alzheimer's disease: Secondary | ICD-10-CM | POA: Diagnosis not present

## 2018-03-28 DIAGNOSIS — I5032 Chronic diastolic (congestive) heart failure: Secondary | ICD-10-CM | POA: Diagnosis not present

## 2018-03-28 DIAGNOSIS — I130 Hypertensive heart and chronic kidney disease with heart failure and stage 1 through stage 4 chronic kidney disease, or unspecified chronic kidney disease: Secondary | ICD-10-CM | POA: Diagnosis not present

## 2018-03-28 DIAGNOSIS — Z7982 Long term (current) use of aspirin: Secondary | ICD-10-CM | POA: Diagnosis not present

## 2018-03-28 DIAGNOSIS — N182 Chronic kidney disease, stage 2 (mild): Secondary | ICD-10-CM | POA: Diagnosis not present

## 2018-03-28 DIAGNOSIS — D631 Anemia in chronic kidney disease: Secondary | ICD-10-CM | POA: Diagnosis not present

## 2018-03-28 DIAGNOSIS — Z9181 History of falling: Secondary | ICD-10-CM | POA: Diagnosis not present

## 2018-03-28 DIAGNOSIS — G629 Polyneuropathy, unspecified: Secondary | ICD-10-CM | POA: Diagnosis not present

## 2018-04-01 DIAGNOSIS — Z5181 Encounter for therapeutic drug level monitoring: Secondary | ICD-10-CM | POA: Diagnosis not present

## 2018-04-02 DIAGNOSIS — I130 Hypertensive heart and chronic kidney disease with heart failure and stage 1 through stage 4 chronic kidney disease, or unspecified chronic kidney disease: Secondary | ICD-10-CM | POA: Diagnosis not present

## 2018-04-02 DIAGNOSIS — Z9181 History of falling: Secondary | ICD-10-CM | POA: Diagnosis not present

## 2018-04-02 DIAGNOSIS — H04129 Dry eye syndrome of unspecified lacrimal gland: Secondary | ICD-10-CM | POA: Diagnosis not present

## 2018-04-02 DIAGNOSIS — Z7982 Long term (current) use of aspirin: Secondary | ICD-10-CM | POA: Diagnosis not present

## 2018-04-02 DIAGNOSIS — I5032 Chronic diastolic (congestive) heart failure: Secondary | ICD-10-CM | POA: Diagnosis not present

## 2018-04-02 DIAGNOSIS — G308 Other Alzheimer's disease: Secondary | ICD-10-CM | POA: Diagnosis not present

## 2018-04-02 DIAGNOSIS — D631 Anemia in chronic kidney disease: Secondary | ICD-10-CM | POA: Diagnosis not present

## 2018-04-02 DIAGNOSIS — G629 Polyneuropathy, unspecified: Secondary | ICD-10-CM | POA: Diagnosis not present

## 2018-04-02 DIAGNOSIS — F5109 Other insomnia not due to a substance or known physiological condition: Secondary | ICD-10-CM | POA: Diagnosis not present

## 2018-04-02 DIAGNOSIS — I4891 Unspecified atrial fibrillation: Secondary | ICD-10-CM | POA: Diagnosis not present

## 2018-04-02 DIAGNOSIS — N182 Chronic kidney disease, stage 2 (mild): Secondary | ICD-10-CM | POA: Diagnosis not present

## 2018-04-02 DIAGNOSIS — E559 Vitamin D deficiency, unspecified: Secondary | ICD-10-CM | POA: Diagnosis not present

## 2018-04-04 DIAGNOSIS — G629 Polyneuropathy, unspecified: Secondary | ICD-10-CM | POA: Diagnosis not present

## 2018-04-04 DIAGNOSIS — I130 Hypertensive heart and chronic kidney disease with heart failure and stage 1 through stage 4 chronic kidney disease, or unspecified chronic kidney disease: Secondary | ICD-10-CM | POA: Diagnosis not present

## 2018-04-04 DIAGNOSIS — Z7982 Long term (current) use of aspirin: Secondary | ICD-10-CM | POA: Diagnosis not present

## 2018-04-04 DIAGNOSIS — N182 Chronic kidney disease, stage 2 (mild): Secondary | ICD-10-CM | POA: Diagnosis not present

## 2018-04-04 DIAGNOSIS — Z9181 History of falling: Secondary | ICD-10-CM | POA: Diagnosis not present

## 2018-04-04 DIAGNOSIS — I5032 Chronic diastolic (congestive) heart failure: Secondary | ICD-10-CM | POA: Diagnosis not present

## 2018-04-04 DIAGNOSIS — D631 Anemia in chronic kidney disease: Secondary | ICD-10-CM | POA: Diagnosis not present

## 2018-04-04 DIAGNOSIS — G308 Other Alzheimer's disease: Secondary | ICD-10-CM | POA: Diagnosis not present

## 2018-04-07 DIAGNOSIS — Z7982 Long term (current) use of aspirin: Secondary | ICD-10-CM | POA: Diagnosis not present

## 2018-04-07 DIAGNOSIS — I130 Hypertensive heart and chronic kidney disease with heart failure and stage 1 through stage 4 chronic kidney disease, or unspecified chronic kidney disease: Secondary | ICD-10-CM | POA: Diagnosis not present

## 2018-04-07 DIAGNOSIS — I10 Essential (primary) hypertension: Secondary | ICD-10-CM | POA: Diagnosis not present

## 2018-04-07 DIAGNOSIS — D631 Anemia in chronic kidney disease: Secondary | ICD-10-CM | POA: Diagnosis not present

## 2018-04-07 DIAGNOSIS — G308 Other Alzheimer's disease: Secondary | ICD-10-CM | POA: Diagnosis not present

## 2018-04-07 DIAGNOSIS — N182 Chronic kidney disease, stage 2 (mild): Secondary | ICD-10-CM | POA: Diagnosis not present

## 2018-04-07 DIAGNOSIS — G629 Polyneuropathy, unspecified: Secondary | ICD-10-CM | POA: Diagnosis not present

## 2018-04-07 DIAGNOSIS — I5032 Chronic diastolic (congestive) heart failure: Secondary | ICD-10-CM | POA: Diagnosis not present

## 2018-04-07 DIAGNOSIS — Z9181 History of falling: Secondary | ICD-10-CM | POA: Diagnosis not present

## 2018-04-09 DIAGNOSIS — Z9181 History of falling: Secondary | ICD-10-CM | POA: Diagnosis not present

## 2018-04-09 DIAGNOSIS — D631 Anemia in chronic kidney disease: Secondary | ICD-10-CM | POA: Diagnosis not present

## 2018-04-09 DIAGNOSIS — G629 Polyneuropathy, unspecified: Secondary | ICD-10-CM | POA: Diagnosis not present

## 2018-04-09 DIAGNOSIS — I130 Hypertensive heart and chronic kidney disease with heart failure and stage 1 through stage 4 chronic kidney disease, or unspecified chronic kidney disease: Secondary | ICD-10-CM | POA: Diagnosis not present

## 2018-04-09 DIAGNOSIS — I5032 Chronic diastolic (congestive) heart failure: Secondary | ICD-10-CM | POA: Diagnosis not present

## 2018-04-09 DIAGNOSIS — G308 Other Alzheimer's disease: Secondary | ICD-10-CM | POA: Diagnosis not present

## 2018-04-09 DIAGNOSIS — Z7982 Long term (current) use of aspirin: Secondary | ICD-10-CM | POA: Diagnosis not present

## 2018-04-09 DIAGNOSIS — N182 Chronic kidney disease, stage 2 (mild): Secondary | ICD-10-CM | POA: Diagnosis not present

## 2018-04-14 DIAGNOSIS — D631 Anemia in chronic kidney disease: Secondary | ICD-10-CM | POA: Diagnosis not present

## 2018-04-14 DIAGNOSIS — G308 Other Alzheimer's disease: Secondary | ICD-10-CM | POA: Diagnosis not present

## 2018-04-14 DIAGNOSIS — I130 Hypertensive heart and chronic kidney disease with heart failure and stage 1 through stage 4 chronic kidney disease, or unspecified chronic kidney disease: Secondary | ICD-10-CM | POA: Diagnosis not present

## 2018-04-14 DIAGNOSIS — Z7982 Long term (current) use of aspirin: Secondary | ICD-10-CM | POA: Diagnosis not present

## 2018-04-14 DIAGNOSIS — G629 Polyneuropathy, unspecified: Secondary | ICD-10-CM | POA: Diagnosis not present

## 2018-04-14 DIAGNOSIS — I5032 Chronic diastolic (congestive) heart failure: Secondary | ICD-10-CM | POA: Diagnosis not present

## 2018-04-14 DIAGNOSIS — N182 Chronic kidney disease, stage 2 (mild): Secondary | ICD-10-CM | POA: Diagnosis not present

## 2018-04-14 DIAGNOSIS — Z9181 History of falling: Secondary | ICD-10-CM | POA: Diagnosis not present

## 2018-04-16 DIAGNOSIS — I130 Hypertensive heart and chronic kidney disease with heart failure and stage 1 through stage 4 chronic kidney disease, or unspecified chronic kidney disease: Secondary | ICD-10-CM | POA: Diagnosis not present

## 2018-04-16 DIAGNOSIS — G629 Polyneuropathy, unspecified: Secondary | ICD-10-CM | POA: Diagnosis not present

## 2018-04-16 DIAGNOSIS — Z9181 History of falling: Secondary | ICD-10-CM | POA: Diagnosis not present

## 2018-04-16 DIAGNOSIS — D631 Anemia in chronic kidney disease: Secondary | ICD-10-CM | POA: Diagnosis not present

## 2018-04-16 DIAGNOSIS — N182 Chronic kidney disease, stage 2 (mild): Secondary | ICD-10-CM | POA: Diagnosis not present

## 2018-04-16 DIAGNOSIS — Z7982 Long term (current) use of aspirin: Secondary | ICD-10-CM | POA: Diagnosis not present

## 2018-04-16 DIAGNOSIS — G308 Other Alzheimer's disease: Secondary | ICD-10-CM | POA: Diagnosis not present

## 2018-04-16 DIAGNOSIS — I5032 Chronic diastolic (congestive) heart failure: Secondary | ICD-10-CM | POA: Diagnosis not present

## 2018-04-21 DIAGNOSIS — I5032 Chronic diastolic (congestive) heart failure: Secondary | ICD-10-CM | POA: Diagnosis not present

## 2018-04-21 DIAGNOSIS — Z7982 Long term (current) use of aspirin: Secondary | ICD-10-CM | POA: Diagnosis not present

## 2018-04-21 DIAGNOSIS — I130 Hypertensive heart and chronic kidney disease with heart failure and stage 1 through stage 4 chronic kidney disease, or unspecified chronic kidney disease: Secondary | ICD-10-CM | POA: Diagnosis not present

## 2018-04-21 DIAGNOSIS — D631 Anemia in chronic kidney disease: Secondary | ICD-10-CM | POA: Diagnosis not present

## 2018-04-21 DIAGNOSIS — Z9181 History of falling: Secondary | ICD-10-CM | POA: Diagnosis not present

## 2018-04-21 DIAGNOSIS — G308 Other Alzheimer's disease: Secondary | ICD-10-CM | POA: Diagnosis not present

## 2018-04-21 DIAGNOSIS — G629 Polyneuropathy, unspecified: Secondary | ICD-10-CM | POA: Diagnosis not present

## 2018-04-21 DIAGNOSIS — N182 Chronic kidney disease, stage 2 (mild): Secondary | ICD-10-CM | POA: Diagnosis not present

## 2018-04-23 DIAGNOSIS — I130 Hypertensive heart and chronic kidney disease with heart failure and stage 1 through stage 4 chronic kidney disease, or unspecified chronic kidney disease: Secondary | ICD-10-CM | POA: Diagnosis not present

## 2018-04-23 DIAGNOSIS — Z9181 History of falling: Secondary | ICD-10-CM | POA: Diagnosis not present

## 2018-04-23 DIAGNOSIS — D631 Anemia in chronic kidney disease: Secondary | ICD-10-CM | POA: Diagnosis not present

## 2018-04-23 DIAGNOSIS — I5032 Chronic diastolic (congestive) heart failure: Secondary | ICD-10-CM | POA: Diagnosis not present

## 2018-04-23 DIAGNOSIS — G308 Other Alzheimer's disease: Secondary | ICD-10-CM | POA: Diagnosis not present

## 2018-04-23 DIAGNOSIS — Z7982 Long term (current) use of aspirin: Secondary | ICD-10-CM | POA: Diagnosis not present

## 2018-04-23 DIAGNOSIS — N182 Chronic kidney disease, stage 2 (mild): Secondary | ICD-10-CM | POA: Diagnosis not present

## 2018-04-23 DIAGNOSIS — G629 Polyneuropathy, unspecified: Secondary | ICD-10-CM | POA: Diagnosis not present

## 2018-04-28 DIAGNOSIS — Z7982 Long term (current) use of aspirin: Secondary | ICD-10-CM | POA: Diagnosis not present

## 2018-04-28 DIAGNOSIS — G629 Polyneuropathy, unspecified: Secondary | ICD-10-CM | POA: Diagnosis not present

## 2018-04-28 DIAGNOSIS — D631 Anemia in chronic kidney disease: Secondary | ICD-10-CM | POA: Diagnosis not present

## 2018-04-28 DIAGNOSIS — I5032 Chronic diastolic (congestive) heart failure: Secondary | ICD-10-CM | POA: Diagnosis not present

## 2018-04-28 DIAGNOSIS — Z9181 History of falling: Secondary | ICD-10-CM | POA: Diagnosis not present

## 2018-04-28 DIAGNOSIS — I130 Hypertensive heart and chronic kidney disease with heart failure and stage 1 through stage 4 chronic kidney disease, or unspecified chronic kidney disease: Secondary | ICD-10-CM | POA: Diagnosis not present

## 2018-04-28 DIAGNOSIS — G308 Other Alzheimer's disease: Secondary | ICD-10-CM | POA: Diagnosis not present

## 2018-04-28 DIAGNOSIS — N182 Chronic kidney disease, stage 2 (mild): Secondary | ICD-10-CM | POA: Diagnosis not present

## 2018-04-30 DIAGNOSIS — Z9181 History of falling: Secondary | ICD-10-CM | POA: Diagnosis not present

## 2018-04-30 DIAGNOSIS — Z7982 Long term (current) use of aspirin: Secondary | ICD-10-CM | POA: Diagnosis not present

## 2018-04-30 DIAGNOSIS — G629 Polyneuropathy, unspecified: Secondary | ICD-10-CM | POA: Diagnosis not present

## 2018-04-30 DIAGNOSIS — I5032 Chronic diastolic (congestive) heart failure: Secondary | ICD-10-CM | POA: Diagnosis not present

## 2018-04-30 DIAGNOSIS — N182 Chronic kidney disease, stage 2 (mild): Secondary | ICD-10-CM | POA: Diagnosis not present

## 2018-04-30 DIAGNOSIS — I130 Hypertensive heart and chronic kidney disease with heart failure and stage 1 through stage 4 chronic kidney disease, or unspecified chronic kidney disease: Secondary | ICD-10-CM | POA: Diagnosis not present

## 2018-04-30 DIAGNOSIS — G308 Other Alzheimer's disease: Secondary | ICD-10-CM | POA: Diagnosis not present

## 2018-04-30 DIAGNOSIS — D631 Anemia in chronic kidney disease: Secondary | ICD-10-CM | POA: Diagnosis not present

## 2018-04-30 DIAGNOSIS — I10 Essential (primary) hypertension: Secondary | ICD-10-CM | POA: Diagnosis not present

## 2018-04-30 DIAGNOSIS — I509 Heart failure, unspecified: Secondary | ICD-10-CM | POA: Diagnosis not present

## 2018-04-30 DIAGNOSIS — F5109 Other insomnia not due to a substance or known physiological condition: Secondary | ICD-10-CM | POA: Diagnosis not present

## 2018-05-05 DIAGNOSIS — G308 Other Alzheimer's disease: Secondary | ICD-10-CM | POA: Diagnosis not present

## 2018-05-05 DIAGNOSIS — I130 Hypertensive heart and chronic kidney disease with heart failure and stage 1 through stage 4 chronic kidney disease, or unspecified chronic kidney disease: Secondary | ICD-10-CM | POA: Diagnosis not present

## 2018-05-05 DIAGNOSIS — I5032 Chronic diastolic (congestive) heart failure: Secondary | ICD-10-CM | POA: Diagnosis not present

## 2018-05-05 DIAGNOSIS — K5909 Other constipation: Secondary | ICD-10-CM | POA: Diagnosis not present

## 2018-05-05 DIAGNOSIS — N182 Chronic kidney disease, stage 2 (mild): Secondary | ICD-10-CM | POA: Diagnosis not present

## 2018-05-05 DIAGNOSIS — Z9181 History of falling: Secondary | ICD-10-CM | POA: Diagnosis not present

## 2018-05-05 DIAGNOSIS — D631 Anemia in chronic kidney disease: Secondary | ICD-10-CM | POA: Diagnosis not present

## 2018-05-05 DIAGNOSIS — E7849 Other hyperlipidemia: Secondary | ICD-10-CM | POA: Diagnosis not present

## 2018-05-05 DIAGNOSIS — G629 Polyneuropathy, unspecified: Secondary | ICD-10-CM | POA: Diagnosis not present

## 2018-05-05 DIAGNOSIS — Z7982 Long term (current) use of aspirin: Secondary | ICD-10-CM | POA: Diagnosis not present

## 2018-05-06 DIAGNOSIS — Z7982 Long term (current) use of aspirin: Secondary | ICD-10-CM | POA: Diagnosis not present

## 2018-05-06 DIAGNOSIS — I130 Hypertensive heart and chronic kidney disease with heart failure and stage 1 through stage 4 chronic kidney disease, or unspecified chronic kidney disease: Secondary | ICD-10-CM | POA: Diagnosis not present

## 2018-05-06 DIAGNOSIS — I5032 Chronic diastolic (congestive) heart failure: Secondary | ICD-10-CM | POA: Diagnosis not present

## 2018-05-06 DIAGNOSIS — N182 Chronic kidney disease, stage 2 (mild): Secondary | ICD-10-CM | POA: Diagnosis not present

## 2018-05-06 DIAGNOSIS — G629 Polyneuropathy, unspecified: Secondary | ICD-10-CM | POA: Diagnosis not present

## 2018-05-06 DIAGNOSIS — Z9181 History of falling: Secondary | ICD-10-CM | POA: Diagnosis not present

## 2018-05-06 DIAGNOSIS — D631 Anemia in chronic kidney disease: Secondary | ICD-10-CM | POA: Diagnosis not present

## 2018-05-06 DIAGNOSIS — G308 Other Alzheimer's disease: Secondary | ICD-10-CM | POA: Diagnosis not present

## 2018-05-13 DIAGNOSIS — T50901A Poisoning by unspecified drugs, medicaments and biological substances, accidental (unintentional), initial encounter: Secondary | ICD-10-CM | POA: Diagnosis not present

## 2018-05-15 DIAGNOSIS — M79673 Pain in unspecified foot: Secondary | ICD-10-CM | POA: Diagnosis not present

## 2018-05-15 DIAGNOSIS — R2681 Unsteadiness on feet: Secondary | ICD-10-CM | POA: Diagnosis not present

## 2018-05-15 DIAGNOSIS — L603 Nail dystrophy: Secondary | ICD-10-CM | POA: Diagnosis not present

## 2018-05-15 DIAGNOSIS — L851 Acquired keratosis [keratoderma] palmaris et plantaris: Secondary | ICD-10-CM | POA: Diagnosis not present

## 2018-05-15 DIAGNOSIS — N182 Chronic kidney disease, stage 2 (mild): Secondary | ICD-10-CM | POA: Diagnosis not present

## 2018-05-21 DIAGNOSIS — E876 Hypokalemia: Secondary | ICD-10-CM | POA: Diagnosis not present

## 2018-05-21 DIAGNOSIS — R600 Localized edema: Secondary | ICD-10-CM | POA: Diagnosis not present

## 2018-05-23 DIAGNOSIS — R600 Localized edema: Secondary | ICD-10-CM | POA: Diagnosis not present

## 2018-05-28 DIAGNOSIS — F5109 Other insomnia not due to a substance or known physiological condition: Secondary | ICD-10-CM | POA: Diagnosis not present

## 2018-05-28 DIAGNOSIS — R600 Localized edema: Secondary | ICD-10-CM | POA: Diagnosis not present

## 2018-05-28 DIAGNOSIS — E559 Vitamin D deficiency, unspecified: Secondary | ICD-10-CM | POA: Diagnosis not present

## 2018-05-30 DIAGNOSIS — F5109 Other insomnia not due to a substance or known physiological condition: Secondary | ICD-10-CM | POA: Diagnosis not present

## 2018-05-30 DIAGNOSIS — I10 Essential (primary) hypertension: Secondary | ICD-10-CM | POA: Diagnosis not present

## 2018-05-30 DIAGNOSIS — G308 Other Alzheimer's disease: Secondary | ICD-10-CM | POA: Diagnosis not present

## 2018-05-30 DIAGNOSIS — I509 Heart failure, unspecified: Secondary | ICD-10-CM | POA: Diagnosis not present

## 2018-06-03 DIAGNOSIS — Z79899 Other long term (current) drug therapy: Secondary | ICD-10-CM | POA: Diagnosis not present

## 2018-06-05 DIAGNOSIS — M19012 Primary osteoarthritis, left shoulder: Secondary | ICD-10-CM | POA: Diagnosis not present

## 2018-06-05 DIAGNOSIS — M25512 Pain in left shoulder: Secondary | ICD-10-CM | POA: Diagnosis not present

## 2018-06-11 DIAGNOSIS — I509 Heart failure, unspecified: Secondary | ICD-10-CM | POA: Diagnosis not present

## 2018-06-11 DIAGNOSIS — K5909 Other constipation: Secondary | ICD-10-CM | POA: Diagnosis not present

## 2018-06-11 DIAGNOSIS — I10 Essential (primary) hypertension: Secondary | ICD-10-CM | POA: Diagnosis not present

## 2018-06-25 DIAGNOSIS — F5109 Other insomnia not due to a substance or known physiological condition: Secondary | ICD-10-CM | POA: Diagnosis not present

## 2018-06-25 DIAGNOSIS — E7849 Other hyperlipidemia: Secondary | ICD-10-CM | POA: Diagnosis not present

## 2018-06-25 DIAGNOSIS — I4891 Unspecified atrial fibrillation: Secondary | ICD-10-CM | POA: Diagnosis not present

## 2018-07-02 DIAGNOSIS — R350 Frequency of micturition: Secondary | ICD-10-CM | POA: Diagnosis not present

## 2018-07-02 DIAGNOSIS — E876 Hypokalemia: Secondary | ICD-10-CM | POA: Diagnosis not present

## 2018-07-02 DIAGNOSIS — E559 Vitamin D deficiency, unspecified: Secondary | ICD-10-CM | POA: Diagnosis not present

## 2018-07-03 DIAGNOSIS — Z79899 Other long term (current) drug therapy: Secondary | ICD-10-CM | POA: Diagnosis not present

## 2018-07-03 DIAGNOSIS — N390 Urinary tract infection, site not specified: Secondary | ICD-10-CM | POA: Diagnosis not present

## 2018-07-16 DIAGNOSIS — K5909 Other constipation: Secondary | ICD-10-CM | POA: Diagnosis not present

## 2018-07-16 DIAGNOSIS — J3489 Other specified disorders of nose and nasal sinuses: Secondary | ICD-10-CM | POA: Diagnosis not present

## 2018-07-23 DIAGNOSIS — H04129 Dry eye syndrome of unspecified lacrimal gland: Secondary | ICD-10-CM | POA: Diagnosis not present

## 2018-07-23 DIAGNOSIS — I4891 Unspecified atrial fibrillation: Secondary | ICD-10-CM | POA: Diagnosis not present

## 2018-07-24 DIAGNOSIS — L84 Corns and callosities: Secondary | ICD-10-CM | POA: Diagnosis not present

## 2018-07-24 DIAGNOSIS — L603 Nail dystrophy: Secondary | ICD-10-CM | POA: Diagnosis not present

## 2018-07-24 DIAGNOSIS — N182 Chronic kidney disease, stage 2 (mild): Secondary | ICD-10-CM | POA: Diagnosis not present

## 2018-07-24 DIAGNOSIS — M79673 Pain in unspecified foot: Secondary | ICD-10-CM | POA: Diagnosis not present

## 2018-07-24 DIAGNOSIS — B351 Tinea unguium: Secondary | ICD-10-CM | POA: Diagnosis not present

## 2018-07-30 DIAGNOSIS — F5109 Other insomnia not due to a substance or known physiological condition: Secondary | ICD-10-CM | POA: Diagnosis not present

## 2018-07-30 DIAGNOSIS — I509 Heart failure, unspecified: Secondary | ICD-10-CM | POA: Diagnosis not present

## 2018-08-02 DIAGNOSIS — I509 Heart failure, unspecified: Secondary | ICD-10-CM | POA: Diagnosis not present

## 2018-08-02 DIAGNOSIS — F5109 Other insomnia not due to a substance or known physiological condition: Secondary | ICD-10-CM | POA: Diagnosis not present

## 2018-08-13 DIAGNOSIS — E876 Hypokalemia: Secondary | ICD-10-CM | POA: Diagnosis not present

## 2018-08-13 DIAGNOSIS — I509 Heart failure, unspecified: Secondary | ICD-10-CM | POA: Diagnosis not present

## 2018-08-13 DIAGNOSIS — I10 Essential (primary) hypertension: Secondary | ICD-10-CM | POA: Diagnosis not present

## 2018-08-27 DIAGNOSIS — E785 Hyperlipidemia, unspecified: Secondary | ICD-10-CM | POA: Diagnosis not present

## 2018-08-27 DIAGNOSIS — I4891 Unspecified atrial fibrillation: Secondary | ICD-10-CM | POA: Diagnosis not present

## 2018-08-27 DIAGNOSIS — F5109 Other insomnia not due to a substance or known physiological condition: Secondary | ICD-10-CM | POA: Diagnosis not present

## 2018-08-27 DIAGNOSIS — E876 Hypokalemia: Secondary | ICD-10-CM | POA: Diagnosis not present

## 2018-09-03 DIAGNOSIS — E876 Hypokalemia: Secondary | ICD-10-CM | POA: Diagnosis not present

## 2018-09-03 DIAGNOSIS — N179 Acute kidney failure, unspecified: Secondary | ICD-10-CM | POA: Diagnosis not present

## 2018-09-07 DIAGNOSIS — F5109 Other insomnia not due to a substance or known physiological condition: Secondary | ICD-10-CM | POA: Diagnosis not present

## 2018-09-07 DIAGNOSIS — E7849 Other hyperlipidemia: Secondary | ICD-10-CM | POA: Diagnosis not present

## 2018-09-07 DIAGNOSIS — E876 Hypokalemia: Secondary | ICD-10-CM | POA: Diagnosis not present

## 2018-09-17 ENCOUNTER — Encounter: Payer: Self-pay | Admitting: Internal Medicine

## 2018-09-19 DIAGNOSIS — K5909 Other constipation: Secondary | ICD-10-CM | POA: Diagnosis not present

## 2018-09-19 DIAGNOSIS — I509 Heart failure, unspecified: Secondary | ICD-10-CM | POA: Diagnosis not present

## 2018-09-24 DIAGNOSIS — I10 Essential (primary) hypertension: Secondary | ICD-10-CM | POA: Diagnosis not present

## 2018-09-24 DIAGNOSIS — I4891 Unspecified atrial fibrillation: Secondary | ICD-10-CM | POA: Diagnosis not present

## 2018-09-24 DIAGNOSIS — F5109 Other insomnia not due to a substance or known physiological condition: Secondary | ICD-10-CM | POA: Diagnosis not present

## 2018-09-27 DIAGNOSIS — I509 Heart failure, unspecified: Secondary | ICD-10-CM | POA: Diagnosis not present

## 2018-09-27 DIAGNOSIS — F5109 Other insomnia not due to a substance or known physiological condition: Secondary | ICD-10-CM | POA: Diagnosis not present

## 2018-09-27 DIAGNOSIS — I10 Essential (primary) hypertension: Secondary | ICD-10-CM | POA: Diagnosis not present

## 2018-09-29 DIAGNOSIS — Z79899 Other long term (current) drug therapy: Secondary | ICD-10-CM | POA: Diagnosis not present

## 2018-09-30 DIAGNOSIS — Z79899 Other long term (current) drug therapy: Secondary | ICD-10-CM | POA: Diagnosis not present

## 2018-10-01 DIAGNOSIS — D6489 Other specified anemias: Secondary | ICD-10-CM | POA: Diagnosis not present

## 2018-10-01 DIAGNOSIS — E785 Hyperlipidemia, unspecified: Secondary | ICD-10-CM | POA: Diagnosis not present

## 2018-10-01 DIAGNOSIS — N179 Acute kidney failure, unspecified: Secondary | ICD-10-CM | POA: Diagnosis not present

## 2018-10-04 DIAGNOSIS — N179 Acute kidney failure, unspecified: Secondary | ICD-10-CM | POA: Diagnosis not present

## 2018-10-04 DIAGNOSIS — E7849 Other hyperlipidemia: Secondary | ICD-10-CM | POA: Diagnosis not present

## 2018-10-04 DIAGNOSIS — D6489 Other specified anemias: Secondary | ICD-10-CM | POA: Diagnosis not present

## 2018-10-10 ENCOUNTER — Encounter (HOSPITAL_COMMUNITY): Payer: Self-pay | Admitting: Emergency Medicine

## 2018-10-10 ENCOUNTER — Emergency Department (HOSPITAL_COMMUNITY)
Admission: EM | Admit: 2018-10-10 | Discharge: 2018-10-10 | Disposition: A | Discharge status: Home / Self Care | Payer: Medicare Other | Attending: Emergency Medicine | Admitting: Emergency Medicine

## 2018-10-10 ENCOUNTER — Emergency Department (HOSPITAL_COMMUNITY): Payer: Medicare Other

## 2018-10-10 DIAGNOSIS — I5032 Chronic diastolic (congestive) heart failure: Secondary | ICD-10-CM | POA: Diagnosis not present

## 2018-10-10 DIAGNOSIS — N182 Chronic kidney disease, stage 2 (mild): Secondary | ICD-10-CM | POA: Diagnosis not present

## 2018-10-10 DIAGNOSIS — Z03818 Encounter for observation for suspected exposure to other biological agents ruled out: Secondary | ICD-10-CM | POA: Diagnosis not present

## 2018-10-10 DIAGNOSIS — Z79899 Other long term (current) drug therapy: Secondary | ICD-10-CM | POA: Insufficient documentation

## 2018-10-10 DIAGNOSIS — K5900 Constipation, unspecified: Secondary | ICD-10-CM | POA: Diagnosis not present

## 2018-10-10 DIAGNOSIS — R279 Unspecified lack of coordination: Secondary | ICD-10-CM | POA: Diagnosis not present

## 2018-10-10 DIAGNOSIS — I10 Essential (primary) hypertension: Secondary | ICD-10-CM | POA: Diagnosis not present

## 2018-10-10 DIAGNOSIS — Z7982 Long term (current) use of aspirin: Secondary | ICD-10-CM | POA: Diagnosis not present

## 2018-10-10 DIAGNOSIS — Z87891 Personal history of nicotine dependence: Secondary | ICD-10-CM | POA: Diagnosis not present

## 2018-10-10 DIAGNOSIS — R1032 Left lower quadrant pain: Secondary | ICD-10-CM | POA: Diagnosis present

## 2018-10-10 DIAGNOSIS — R61 Generalized hyperhidrosis: Secondary | ICD-10-CM | POA: Diagnosis not present

## 2018-10-10 DIAGNOSIS — I130 Hypertensive heart and chronic kidney disease with heart failure and stage 1 through stage 4 chronic kidney disease, or unspecified chronic kidney disease: Secondary | ICD-10-CM | POA: Insufficient documentation

## 2018-10-10 DIAGNOSIS — Z8673 Personal history of transient ischemic attack (TIA), and cerebral infarction without residual deficits: Secondary | ICD-10-CM | POA: Insufficient documentation

## 2018-10-10 DIAGNOSIS — Z20828 Contact with and (suspected) exposure to other viral communicable diseases: Secondary | ICD-10-CM | POA: Insufficient documentation

## 2018-10-10 DIAGNOSIS — Z743 Need for continuous supervision: Secondary | ICD-10-CM | POA: Diagnosis not present

## 2018-10-10 DIAGNOSIS — R1084 Generalized abdominal pain: Secondary | ICD-10-CM | POA: Diagnosis not present

## 2018-10-10 HISTORY — DX: Heart failure, unspecified: I50.9

## 2018-10-10 LAB — URINALYSIS, ROUTINE W REFLEX MICROSCOPIC
Bilirubin Urine: NEGATIVE
Glucose, UA: NEGATIVE mg/dL
Hgb urine dipstick: NEGATIVE
Ketones, ur: NEGATIVE mg/dL
Leukocytes,Ua: NEGATIVE
Nitrite: NEGATIVE
Protein, ur: NEGATIVE mg/dL
Specific Gravity, Urine: 1.012 (ref 1.005–1.030)
pH: 6 (ref 5.0–8.0)

## 2018-10-10 LAB — COMPREHENSIVE METABOLIC PANEL
ALT: 10 U/L (ref 0–44)
AST: 13 U/L — ABNORMAL LOW (ref 15–41)
Albumin: 3.6 g/dL (ref 3.5–5.0)
Alkaline Phosphatase: 81 U/L (ref 38–126)
Anion gap: 8 (ref 5–15)
BUN: 17 mg/dL (ref 8–23)
CO2: 24 mmol/L (ref 22–32)
Calcium: 8.9 mg/dL (ref 8.9–10.3)
Chloride: 107 mmol/L (ref 98–111)
Creatinine, Ser: 0.75 mg/dL (ref 0.44–1.00)
GFR calc Af Amer: >60 mL/min (ref >60)
GFR calc non Af Amer: >60 mL/min (ref >60)
Glucose, Bld: 101 mg/dL — ABNORMAL HIGH (ref 70–99)
Potassium: 3.8 mmol/L (ref 3.5–5.1)
Sodium: 139 mmol/L (ref 135–145)
Total Bilirubin: <0.1 mg/dL — ABNORMAL LOW (ref 0.3–1.2)
Total Protein: 6.4 g/dL — ABNORMAL LOW (ref 6.5–8.1)

## 2018-10-10 LAB — CBC WITH DIFFERENTIAL/PLATELET
Abs Immature Granulocytes: 0.02 10*3/uL (ref 0.00–0.07)
Basophils Absolute: 0.1 10*3/uL (ref 0.0–0.1)
Basophils Relative: 1 %
Eosinophils Absolute: 0.3 10*3/uL (ref 0.0–0.5)
Eosinophils Relative: 4 %
HCT: 32.7 % — ABNORMAL LOW (ref 36.0–46.0)
Hemoglobin: 10 g/dL — ABNORMAL LOW (ref 12.0–15.0)
Immature Granulocytes: 0 %
Lymphocytes Relative: 14 %
Lymphs Abs: 1 10*3/uL (ref 0.7–4.0)
MCH: 24.8 pg — ABNORMAL LOW (ref 26.0–34.0)
MCHC: 30.6 g/dL (ref 30.0–36.0)
MCV: 80.9 fL (ref 80.0–100.0)
Monocytes Absolute: 0.5 10*3/uL (ref 0.1–1.0)
Monocytes Relative: 7 %
Neutro Abs: 5.1 10*3/uL (ref 1.7–7.7)
Neutrophils Relative %: 74 %
Platelets: 205 10*3/uL (ref 150–400)
RBC: 4.04 MIL/uL (ref 3.87–5.11)
RDW: 14.5 % (ref 11.5–15.5)
WBC: 7 10*3/uL (ref 4.0–10.5)
nRBC: 0 % (ref 0.0–0.2)

## 2018-10-10 LAB — SARS CORONAVIRUS 2 BY RT PCR (HOSPITAL ORDER, PERFORMED IN ~~LOC~~ HOSPITAL LAB): SARS Coronavirus 2: NEGATIVE

## 2018-10-10 LAB — LIPASE, BLOOD: Lipase: 32 U/L (ref 11–51)

## 2018-10-10 MED ORDER — IOHEXOL 300 MG/ML  SOLN
100.0000 mL | Freq: Once | INTRAMUSCULAR | Status: AC | PRN
  Administered 2018-10-10: 12:00:00 100 mL via INTRAVENOUS

## 2018-10-10 MED ORDER — SODIUM CHLORIDE (PF) 0.9 % IJ SOLN
INTRAMUSCULAR | Status: AC
  Filled 2018-10-10: qty 50

## 2018-10-10 MED ORDER — POLYETHYLENE GLYCOL 3350 17 G PO PACK: 17.0000 g | PACK | Freq: Every day | ORAL | 0 refills | Status: DC

## 2018-10-10 MED ORDER — ACETAMINOPHEN 325 MG PO TABS
650.0000 mg | ORAL_TABLET | Freq: Once | ORAL | Status: AC
  Administered 2018-10-10: 14:00:00 650 mg via ORAL
  Filled 2018-10-10: qty 2

## 2018-10-10 NOTE — ED Notes (Signed)
[facility] called for transport back to facility 

## 2018-10-10 NOTE — ED Notes (Signed)
Patient transported to CT 

## 2018-10-10 NOTE — ED Notes (Signed)
Bed: WA09 Expected date:  Expected time:  Means of arrival:  Comments: EMS-constipation

## 2018-10-10 NOTE — ED Notes (Signed)
ED Provider at bedside. 

## 2018-10-10 NOTE — Discharge Instructions (Signed)
You have been evaluated for your symptoms.  There are evidence of mild constipation.  You should take MiraLAX daily as it will help regulate your bowel regimen.  Your COVID-19 test today is negative.

## 2018-10-10 NOTE — ED Provider Notes (Signed)
[facility] DEPT Provider Note   CSN: [id number] Arrival date & time: 10/10/18  0906    History   Chief Complaint Chief Complaint  Patient presents with  . Abdominal Pain  . Constipation    HPI AZALIE HARBECK is a 73 y.o. female.     The history is provided by the patient, the EMS personnel and medical records. No language interpreter was used.  Abdominal Pain  Associated symptoms: constipation   Constipation  Associated symptoms: abdominal pain      73 year old female with history of irritable bowel syndrome, hypertension, GERD, dementia presenting for evaluation of abdominal pain.  Patient lives at a skilled nursing facility.  She mentioned to the staff that she was sweating last night and attributed to the room being hot since her roommate likes to keep the room hot.  She also report having trouble with constipation for several weeks.  She attributed that to her hemorrhoid however it is difficult to obtain a full history from patient.  She endorses mild lower abdominal discomfort, described as sharp and non radiating.  EMS did note that the room was very hot upon arrival.  The facility did request patient to reevaluate for covid-19 since there has been previous positive covid cases at the facility.  Patient however denies having fever, chills, productive cough, shortness of breath, chest pain, dysuria.  She has been eating and drinking fine.    Past Medical History:  Diagnosis Date  . Alcoholism ([place])   . Anxiety   . Atrial fibrillation ([place]) 09/07/2017   CHADS2Vasc of 4  Patient is not a candidate for anticoagulation at present primarily due to her inability to care for self at home and unwillingness to allow supervision (an ongong issue, APS has been involved).  If this were to change (I.e. She were to enter assisted living or have live in aide), considerations may change, and anticoagulation would be recommended.    She is alternately on ASA 32   . Depression   . Elevated hemoglobin A1c   . Fracture of right wrist   . GERD (gastroesophageal reflux disease)   . Heart murmur   . Hyperlipidemia   . Hypertension   . IBS (irritable bowel syndrome)   . TIA (transient ischemic attack) 06/18/2017    Patient Active Problem List   Diagnosis Date Noted  . Syncope 10/12/2017  . Benzodiazepine overdose 09/07/2017  . Dementia ([place]) 09/07/2017  . Chronic diastolic heart failure ([place]) 09/07/2017  . Chronic anemia 09/07/2017  . Hypokalemia 08/19/2017  . Depression with anxiety 05/09/2017  . Fall 05/09/2017  . Slurred speech 05/09/2017  . CKD (chronic kidney disease) stage 2, GFR 60-89 ml/min 04/23/2017  . OAB (overactive bladder) 06/09/2014  . Vitamin D deficiency 10/27/2013  . Medication management 10/27/2013  . Hypertension   . Hyperlipidemia, mixed   . GERD (gastroesophageal reflux disease)   . Bipolar depression ([place])   . Alcoholism ([place])   . IBS (irritable bowel syndrome)   . Other abnormal glucose     Past Surgical History:  Procedure Laterality Date  . ABDOMINAL HYSTERECTOMY  1991  . APPENDECTOMY    . BREAST BIOPSY Right 05/2016   fat necrosis  . BREAST SURGERY Left 1989   [facility]  . CARPAL TUNNEL RELEASE Right 10/18/2014   Procedure: CARPAL TUNNEL RELEASE;  Surgeon: Dorna Leitz, MD;  Location: [facility];  Service: Orthopedics;  Laterality: Right;  . [place]  . NECK SURGERY  Remote  Ruptured disc, per patient. Got infected.   . ORIF WRIST FRACTURE Right 10/18/2014   Procedure: OPEN REDUCTION INTERNAL FIXATION (ORIF) RIGHT WRIST FRACTURE;  Surgeon: Dorna Leitz, MD;  Location: [facility];  Service: Orthopedics;  Laterality: Right;  . TONSILLECTOMY  1965  . TUBAL LIGATION       OB History   No obstetric history on file.      Home Medications    Prior to Admission medications   Medication Sig Start Date End Date Taking? Authorizing Provider  acetaminophen (TYLENOL) 325 MG tablet Take 650 mg by mouth  every 6 (six) hours as needed for moderate pain.    [provider]  aspirin 325 MG tablet Take 325 mg by mouth daily.    [provider]  buPROPion (WELLBUTRIN XL) 300 MG 24 hr tablet Take 1 tablet (300 mg total) by mouth daily. 08/27/17   Danford, Suann Larry, MD  Cholecalciferol (VITAMIN D3) 5000 UNITS CAPS Take 5,000 Units by mouth daily. Take on Mon-Fri    [provider]  clonazepam (KLONOPIN) 0.125 MG disintegrating tablet Take 0.125 mg by mouth at bedtime as needed for anxiety.    [provider]  ferrous sulfate 325 (65 FE) MG EC tablet Take 325 mg by mouth daily with breakfast.    [provider]  gabapentin (NEURONTIN) 300 MG capsule Take 300 mg by mouth at bedtime.  10/26/16   [provider]  lamoTRIgine (LAMICTAL) 200 MG tablet Take 400 mg by mouth daily.  04/21/15   [provider]  midodrine (PROAMATINE) 5 MG tablet Take 5 mg by mouth 3 (three) times daily with meals.    [provider]  polyethylene glycol (MIRALAX / GLYCOLAX) packet Take 17 g by mouth daily.    [provider]  potassium chloride (K-DUR) 10 MEQ tablet Take 1 tablet (10 mEq total) by mouth daily. 08/27/17   Danford, Suann Larry, MD  Probiotic CAPS Take 1 capsule by mouth daily.    [provider]  rosuvastatin (CRESTOR) 20 MG tablet Take 20 mg by mouth daily.    [provider]  sertraline (ZOLOFT) 100 MG tablet Take 200 mg by mouth daily.  04/11/15   [provider]  sulfamethoxazole-trimethoprim (BACTRIM DS,SEPTRA DS) 800-160 MG tablet Take 1 tablet by mouth 2 (two) times daily.    [provider]  Thiamine HCl (VITAMIN B-1) 250 MG tablet Take 250 mg by mouth 2 (two) times daily.    [provider]    Family History Family History  Problem Relation Age of Onset  . Heart disease Mother   . Diabetes Mother   . Leukemia Father   . Heart disease Brother   . Cancer Brother   .  Parkinson's disease Brother     Social History Social History   Tobacco Use  . Smoking status: Former Smoker    Types: Cigarettes    Last attempt to quit: 05/14/2010    Years since quitting: 8.4  . Smokeless tobacco: Never Used  . Tobacco comment: second hand smoke exposure also  Substance Use Topics  . Alcohol use: Not Currently    Comment: Quit 2002, denies she ever abused it.  . Drug use: No     Allergies   Lipitor [atorvastatin] and Prednisone   Review of Systems Review of Systems  Gastrointestinal: Positive for abdominal pain and constipation.  All other systems reviewed and are negative.    Physical Exam Updated Vital Signs BP (!) 160/74  Pulse 72   Temp 98.1 F (36.7 C)   Resp 18   SpO2 94%   Physical Exam Vitals signs and nursing note reviewed. Exam conducted with a chaperone present.  Constitutional:      General: She is not in acute distress.    Appearance: She is well-developed.     Comments: Elderly female appears to be in no acute discomfort.  HENT:     Head: Atraumatic.  Eyes:     Conjunctiva/sclera: Conjunctivae normal.  Neck:     Musculoskeletal: Neck supple.  Cardiovascular:     Rate and Rhythm: Normal rate and regular rhythm.  Pulmonary:     Effort: Pulmonary effort is normal.     Breath sounds: Normal breath sounds.  Abdominal:     General: Abdomen is flat. Bowel sounds are normal.     Tenderness: There is abdominal tenderness (Very mild left lower quadrant tenderness without guarding or rebound tenderness.  Abdomen is soft).  Genitourinary:    Comments: Chaperone present during exam.  Nonthrombosed hemorrhoid noted.  Normal rectal tone, no obvious mass, soft stool in rectal vault.  No blood on glove Skin:    Findings: No rash.  Neurological:     Mental Status: She is alert.      ED Treatments / Results  Labs (all labs ordered are listed, but only abnormal results are displayed) Labs Reviewed  CBC WITH DIFFERENTIAL/PLATELET -  Abnormal; Notable for the following components:      Result Value   Hemoglobin 10.0 (*)    HCT 32.7 (*)    MCH 24.8 (*)    All other components within normal limits  COMPREHENSIVE METABOLIC PANEL - Abnormal; Notable for the following components:   Glucose, Bld 101 (*)    Total Protein 6.4 (*)    AST 13 (*)    Total Bilirubin <0.1 (*)    All other components within normal limits  URINALYSIS, ROUTINE W REFLEX MICROSCOPIC - Abnormal; Notable for the following components:   Color, Urine STRAW (*)    All other components within normal limits  SARS CORONAVIRUS 2 (HOSPITAL ORDER, [facility])  LIPASE, BLOOD    EKG None  Radiology Ct Abdomen Pelvis W Contrast  Result Date: 10/10/2018 CLINICAL DATA:  73 year old with constipation and sweating. Abdominal pain. EXAM: CT ABDOMEN AND PELVIS WITH CONTRAST TECHNIQUE: Multidetector CT imaging of the abdomen and pelvis was performed using the standard protocol following bolus administration of intravenous contrast. CONTRAST:  12mL OMNIPAQUE IOHEXOL 300 MG/ML  SOLN COMPARISON:  08/20/2017 FINDINGS: Lower chest: Compared to the prior CT, there is markedly improved aeration at the lung bases. Slightly prominent peripheral densities probably represent a component of scarring or mild fibrosis. No large pleural effusions. Few calcifications at the aortic root. Hepatobiliary: Subcentimeter hypodensity in the lateral left hepatic lobe is too small to definitively characterize. Otherwise, normal appearance of the liver and gallbladder. No biliary dilatation. Portal venous system is patent. Pancreas: Unremarkable. No pancreatic ductal dilatation or surrounding inflammatory changes. Spleen: Normal in size without focal abnormality. Adrenals/Urinary Tract: Normal adrenal glands. Normal appearance of both kidneys and the urinary bladder. No hydronephrosis. Stomach/Bowel: Stable calcification along the posterior aspect of the GE junction. Wall  thickening in the region of the distal stomach and proximal duodenum but no definite inflammatory changes in this area. Moderate amount of stool throughout the colon. No evidence for bowel dilatation or obstruction. Vascular/Lymphatic: Atherosclerotic calcifications in the abdominal aorta without aneurysm. No abdominopelvic  lymphadenopathy. Reproductive: Status post hysterectomy. No adnexal masses. Other: No ascites.  Negative for free air. Musculoskeletal: Chronic bilateral pars defects at L4 with anterolisthesis at L4-L5. Marked disc space narrowing at L4-L5. Evidence for chronic compression deformity along the superior aspect of L3 and L4. L4 compression deformity may be associated with a large Schmorl's node and disc disease. No acute bone abnormality. IMPRESSION: 1. Mild wall thickening in the distal stomach and proximal duodenum. Findings are nonspecific. Cannot exclude distal gastritis or duodenitis. 2. Moderate amount of stool throughout the colon. No evidence for a bowel obstruction. 3.  Aortic atheroscleros3is. 4. Chronic degenerative disease in the lower lumbar spine. Bilateral pars defects at L4. Electronically Signed   By: Markus Daft M.D.   On: 10/10/2018 12:14    Procedures Procedures (including critical care time)  Medications Ordered in ED Medications  sodium chloride (PF) 0.9 % injection (has no administration in time range)  iohexol (OMNIPAQUE) 300 MG/ML solution 100 mL (100 mLs Intravenous Contrast Given 10/10/18 1135)     Initial Impression / Assessment and Plan / ED Course  I have reviewed the triage vital signs and the nursing notes.  Pertinent labs & imaging results that were available during my care of the patient were reviewed by me and considered in my medical decision making (see chart for details).        BP (!) 160/74   Pulse 72   Temp 98.1 F (36.7 C)   Resp 18   SpO2 94%    Final Clinical Impressions(s) / ED Diagnoses   Final diagnoses:  Constipation,  unspecified constipation type    ED Discharge Orders         Ordered    polyethylene glycol (MIRALAX / GLYCOLAX) 17 g packet  Daily     10/10/18 1359         9:40 AM Patient initially complains of her room being too hot, causing her to sweat.  This is related likely to the temperature of the room as her roommate likes to keep the room quiet.  EMS did confirm that the room was very hot.  She does report complaints of constipation for an unknown amount of time but likely for the past couple of weeks.  She does not have any nausea or vomiting to suggest bowel obstruction.  Rectal examination without evidence of stool impaction.  She does have some mild left lower quadrant tenderness and given her age, will obtain abdominal pelvis CT scan for further evaluation.  She does not have any symptoms to suggest COVID-19 however given her current nursing home status, will obtain COVID-19 testing.  1:57 PM Labs are reassuring.  Abdominal pelvis CT scan shows mild wall thickening in the distal stomach and proximal duodenum.  Findings are nonspecific but cannot exclude distal gastritis or duodenitis.  However patient does not have any significant pain to her upper abdomen to suggest that this is the source of her symptom.  She does complain of some constipation.  There is moderate amount of stool noted in the colon in this CT scan but no evidence of bowel obstruction.  Plan to discharge patient home with MiraLAX to help regulate her bowel regimen.  As noted earlier, rectal exam without evidence of stool impaction.  Care discussed with Dr. Zenia Resides.    [name], PA-C 10/10/18 1402    Lacretia Leigh, MD 10/12/18 [phone]

## 2018-10-10 NOTE — ED Triage Notes (Addendum)
EMS brings pt in after [facility] said they wanted her evaluated for COVID after she was sweating. Upon ems arrival pt's room was very hot and pt was symptom negative. C/O abdominal pain and constipation. Alert and oriented despite dementia.

## 2018-10-15 DIAGNOSIS — K5909 Other constipation: Secondary | ICD-10-CM | POA: Diagnosis not present

## 2018-10-15 DIAGNOSIS — E7439 Other disorders of intestinal carbohydrate absorption: Secondary | ICD-10-CM | POA: Diagnosis not present

## 2018-10-15 DIAGNOSIS — D6489 Other specified anemias: Secondary | ICD-10-CM | POA: Diagnosis not present

## 2018-10-16 DIAGNOSIS — D6489 Other specified anemias: Secondary | ICD-10-CM | POA: Diagnosis not present

## 2018-10-17 DIAGNOSIS — D6489 Other specified anemias: Secondary | ICD-10-CM | POA: Diagnosis not present

## 2018-10-17 DIAGNOSIS — K5909 Other constipation: Secondary | ICD-10-CM | POA: Diagnosis not present

## 2018-10-17 DIAGNOSIS — E7439 Other disorders of intestinal carbohydrate absorption: Secondary | ICD-10-CM | POA: Diagnosis not present

## 2018-10-22 DIAGNOSIS — I4891 Unspecified atrial fibrillation: Secondary | ICD-10-CM | POA: Diagnosis not present

## 2018-10-22 DIAGNOSIS — S301XXD Contusion of abdominal wall, subsequent encounter: Secondary | ICD-10-CM | POA: Diagnosis not present

## 2018-10-22 DIAGNOSIS — R2681 Unsteadiness on feet: Secondary | ICD-10-CM | POA: Diagnosis not present

## 2018-10-22 DIAGNOSIS — F5109 Other insomnia not due to a substance or known physiological condition: Secondary | ICD-10-CM | POA: Diagnosis not present

## 2018-10-26 DIAGNOSIS — R2689 Other abnormalities of gait and mobility: Secondary | ICD-10-CM | POA: Diagnosis not present

## 2018-10-26 DIAGNOSIS — S7002XA Contusion of left hip, initial encounter: Secondary | ICD-10-CM | POA: Diagnosis not present

## 2018-10-26 DIAGNOSIS — F5109 Other insomnia not due to a substance or known physiological condition: Secondary | ICD-10-CM | POA: Diagnosis not present

## 2018-10-26 DIAGNOSIS — I4891 Unspecified atrial fibrillation: Secondary | ICD-10-CM | POA: Diagnosis not present

## 2018-10-29 DIAGNOSIS — D509 Iron deficiency anemia, unspecified: Secondary | ICD-10-CM | POA: Diagnosis not present

## 2018-10-29 DIAGNOSIS — F5109 Other insomnia not due to a substance or known physiological condition: Secondary | ICD-10-CM | POA: Diagnosis not present

## 2018-10-29 DIAGNOSIS — I509 Heart failure, unspecified: Secondary | ICD-10-CM | POA: Diagnosis not present

## 2018-11-02 DIAGNOSIS — F5109 Other insomnia not due to a substance or known physiological condition: Secondary | ICD-10-CM | POA: Diagnosis not present

## 2018-11-02 DIAGNOSIS — D509 Iron deficiency anemia, unspecified: Secondary | ICD-10-CM | POA: Diagnosis not present

## 2018-11-02 DIAGNOSIS — I509 Heart failure, unspecified: Secondary | ICD-10-CM | POA: Diagnosis not present

## 2018-11-26 DIAGNOSIS — I10 Essential (primary) hypertension: Secondary | ICD-10-CM | POA: Diagnosis not present

## 2018-11-26 DIAGNOSIS — E7849 Other hyperlipidemia: Secondary | ICD-10-CM | POA: Diagnosis not present

## 2018-11-26 DIAGNOSIS — F5109 Other insomnia not due to a substance or known physiological condition: Secondary | ICD-10-CM | POA: Diagnosis not present

## 2018-12-03 DIAGNOSIS — M25512 Pain in left shoulder: Secondary | ICD-10-CM | POA: Diagnosis not present

## 2018-12-03 DIAGNOSIS — I4891 Unspecified atrial fibrillation: Secondary | ICD-10-CM | POA: Diagnosis not present

## 2018-12-17 DIAGNOSIS — M25512 Pain in left shoulder: Secondary | ICD-10-CM | POA: Diagnosis not present

## 2018-12-17 DIAGNOSIS — F5109 Other insomnia not due to a substance or known physiological condition: Secondary | ICD-10-CM | POA: Diagnosis not present

## 2018-12-17 DIAGNOSIS — K5909 Other constipation: Secondary | ICD-10-CM | POA: Diagnosis not present

## 2018-12-20 DIAGNOSIS — Z79899 Other long term (current) drug therapy: Secondary | ICD-10-CM | POA: Diagnosis not present

## 2018-12-23 DIAGNOSIS — Z79899 Other long term (current) drug therapy: Secondary | ICD-10-CM | POA: Diagnosis not present

## 2018-12-24 DIAGNOSIS — M25512 Pain in left shoulder: Secondary | ICD-10-CM | POA: Diagnosis not present

## 2018-12-24 DIAGNOSIS — F5109 Other insomnia not due to a substance or known physiological condition: Secondary | ICD-10-CM | POA: Diagnosis not present

## 2018-12-24 DIAGNOSIS — M19012 Primary osteoarthritis, left shoulder: Secondary | ICD-10-CM | POA: Diagnosis not present

## 2018-12-24 DIAGNOSIS — D509 Iron deficiency anemia, unspecified: Secondary | ICD-10-CM | POA: Diagnosis not present

## 2018-12-29 ENCOUNTER — Other Ambulatory Visit: Payer: Self-pay | Admitting: Internal Medicine

## 2018-12-29 DIAGNOSIS — Z1231 Encounter for screening mammogram for malignant neoplasm of breast: Secondary | ICD-10-CM

## 2018-12-31 DIAGNOSIS — D509 Iron deficiency anemia, unspecified: Secondary | ICD-10-CM | POA: Diagnosis not present

## 2018-12-31 DIAGNOSIS — N179 Acute kidney failure, unspecified: Secondary | ICD-10-CM | POA: Diagnosis not present

## 2018-12-31 DIAGNOSIS — K5909 Other constipation: Secondary | ICD-10-CM | POA: Diagnosis not present

## 2019-01-05 DIAGNOSIS — E611 Iron deficiency: Secondary | ICD-10-CM | POA: Diagnosis not present

## 2019-01-05 DIAGNOSIS — Z79899 Other long term (current) drug therapy: Secondary | ICD-10-CM | POA: Diagnosis not present

## 2019-01-05 DIAGNOSIS — K5903 Drug induced constipation: Secondary | ICD-10-CM | POA: Diagnosis not present

## 2019-01-05 DIAGNOSIS — T454X5A Adverse effect of iron and its compounds, initial encounter: Secondary | ICD-10-CM | POA: Diagnosis not present

## 2019-01-11 ENCOUNTER — Emergency Department (HOSPITAL_COMMUNITY): Payer: Medicare Other

## 2019-01-11 ENCOUNTER — Other Ambulatory Visit: Payer: Self-pay

## 2019-01-11 ENCOUNTER — Inpatient Hospital Stay (HOSPITAL_COMMUNITY)
Admission: EM | Admit: 2019-01-11 | Discharge: 2019-01-21 | DRG: 356 | Disposition: A | Discharge status: Home / Self Care | Payer: Medicare Other | Attending: Internal Medicine | Admitting: Internal Medicine

## 2019-01-11 DIAGNOSIS — R0902 Hypoxemia: Secondary | ICD-10-CM | POA: Diagnosis not present

## 2019-01-11 DIAGNOSIS — R112 Nausea with vomiting, unspecified: Secondary | ICD-10-CM | POA: Diagnosis not present

## 2019-01-11 DIAGNOSIS — I4891 Unspecified atrial fibrillation: Secondary | ICD-10-CM | POA: Diagnosis present

## 2019-01-11 DIAGNOSIS — E876 Hypokalemia: Secondary | ICD-10-CM | POA: Diagnosis not present

## 2019-01-11 DIAGNOSIS — K921 Melena: Secondary | ICD-10-CM

## 2019-01-11 DIAGNOSIS — K264 Chronic or unspecified duodenal ulcer with hemorrhage: Secondary | ICD-10-CM | POA: Diagnosis not present

## 2019-01-11 DIAGNOSIS — F1020 Alcohol dependence, uncomplicated: Secondary | ICD-10-CM | POA: Diagnosis present

## 2019-01-11 DIAGNOSIS — N3281 Overactive bladder: Secondary | ICD-10-CM | POA: Diagnosis not present

## 2019-01-11 DIAGNOSIS — E861 Hypovolemia: Secondary | ICD-10-CM | POA: Diagnosis not present

## 2019-01-11 DIAGNOSIS — Z20828 Contact with and (suspected) exposure to other viral communicable diseases: Secondary | ICD-10-CM | POA: Diagnosis present

## 2019-01-11 DIAGNOSIS — I110 Hypertensive heart disease with heart failure: Secondary | ICD-10-CM | POA: Diagnosis present

## 2019-01-11 DIAGNOSIS — I130 Hypertensive heart and chronic kidney disease with heart failure and stage 1 through stage 4 chronic kidney disease, or unspecified chronic kidney disease: Secondary | ICD-10-CM | POA: Diagnosis not present

## 2019-01-11 DIAGNOSIS — K257 Chronic gastric ulcer without hemorrhage or perforation: Secondary | ICD-10-CM | POA: Diagnosis not present

## 2019-01-11 DIAGNOSIS — R197 Diarrhea, unspecified: Secondary | ICD-10-CM | POA: Diagnosis not present

## 2019-01-11 DIAGNOSIS — K580 Irritable bowel syndrome with diarrhea: Secondary | ICD-10-CM | POA: Diagnosis present

## 2019-01-11 DIAGNOSIS — I10 Essential (primary) hypertension: Secondary | ICD-10-CM | POA: Diagnosis not present

## 2019-01-11 DIAGNOSIS — K269 Duodenal ulcer, unspecified as acute or chronic, without hemorrhage or perforation: Secondary | ICD-10-CM

## 2019-01-11 DIAGNOSIS — K2980 Duodenitis without bleeding: Secondary | ICD-10-CM | POA: Diagnosis present

## 2019-01-11 DIAGNOSIS — Z9851 Tubal ligation status: Secondary | ICD-10-CM | POA: Diagnosis not present

## 2019-01-11 DIAGNOSIS — Z7982 Long term (current) use of aspirin: Secondary | ICD-10-CM | POA: Diagnosis not present

## 2019-01-11 DIAGNOSIS — D649 Anemia, unspecified: Secondary | ICD-10-CM | POA: Diagnosis not present

## 2019-01-11 DIAGNOSIS — Z86010 Personal history of colonic polyps: Secondary | ICD-10-CM

## 2019-01-11 DIAGNOSIS — K449 Diaphragmatic hernia without obstruction or gangrene: Secondary | ICD-10-CM | POA: Diagnosis present

## 2019-01-11 DIAGNOSIS — Z791 Long term (current) use of non-steroidal anti-inflammatories (NSAID): Secondary | ICD-10-CM

## 2019-01-11 DIAGNOSIS — K3189 Other diseases of stomach and duodenum: Secondary | ICD-10-CM | POA: Diagnosis not present

## 2019-01-11 DIAGNOSIS — F319 Bipolar disorder, unspecified: Secondary | ICD-10-CM | POA: Diagnosis not present

## 2019-01-11 DIAGNOSIS — Z888 Allergy status to other drugs, medicaments and biological substances status: Secondary | ICD-10-CM

## 2019-01-11 DIAGNOSIS — K922 Gastrointestinal hemorrhage, unspecified: Secondary | ICD-10-CM

## 2019-01-11 DIAGNOSIS — F0390 Unspecified dementia without behavioral disturbance: Secondary | ICD-10-CM | POA: Diagnosis present

## 2019-01-11 DIAGNOSIS — K920 Hematemesis: Secondary | ICD-10-CM

## 2019-01-11 DIAGNOSIS — R42 Dizziness and giddiness: Secondary | ICD-10-CM | POA: Diagnosis not present

## 2019-01-11 DIAGNOSIS — K219 Gastro-esophageal reflux disease without esophagitis: Secondary | ICD-10-CM | POA: Diagnosis not present

## 2019-01-11 DIAGNOSIS — E8809 Other disorders of plasma-protein metabolism, not elsewhere classified: Secondary | ICD-10-CM | POA: Diagnosis not present

## 2019-01-11 DIAGNOSIS — Z8673 Personal history of transient ischemic attack (TIA), and cerebral infarction without residual deficits: Secondary | ICD-10-CM

## 2019-01-11 DIAGNOSIS — I959 Hypotension, unspecified: Secondary | ICD-10-CM | POA: Diagnosis not present

## 2019-01-11 DIAGNOSIS — J189 Pneumonia, unspecified organism: Secondary | ICD-10-CM | POA: Diagnosis not present

## 2019-01-11 DIAGNOSIS — I5032 Chronic diastolic (congestive) heart failure: Secondary | ICD-10-CM | POA: Diagnosis not present

## 2019-01-11 DIAGNOSIS — K260 Acute duodenal ulcer with hemorrhage: Secondary | ICD-10-CM | POA: Diagnosis not present

## 2019-01-11 DIAGNOSIS — F419 Anxiety disorder, unspecified: Secondary | ICD-10-CM | POA: Diagnosis present

## 2019-01-11 DIAGNOSIS — K2970 Gastritis, unspecified, without bleeding: Secondary | ICD-10-CM | POA: Diagnosis present

## 2019-01-11 DIAGNOSIS — Z87891 Personal history of nicotine dependence: Secondary | ICD-10-CM

## 2019-01-11 DIAGNOSIS — D62 Acute posthemorrhagic anemia: Secondary | ICD-10-CM | POA: Diagnosis present

## 2019-01-11 DIAGNOSIS — I482 Chronic atrial fibrillation, unspecified: Secondary | ICD-10-CM | POA: Diagnosis not present

## 2019-01-11 DIAGNOSIS — I9589 Other hypotension: Secondary | ICD-10-CM | POA: Diagnosis not present

## 2019-01-11 DIAGNOSIS — K259 Gastric ulcer, unspecified as acute or chronic, without hemorrhage or perforation: Secondary | ICD-10-CM | POA: Diagnosis not present

## 2019-01-11 DIAGNOSIS — E782 Mixed hyperlipidemia: Secondary | ICD-10-CM | POA: Diagnosis not present

## 2019-01-11 DIAGNOSIS — Z90710 Acquired absence of both cervix and uterus: Secondary | ICD-10-CM

## 2019-01-11 DIAGNOSIS — Z820 Family history of epilepsy and other diseases of the nervous system: Secondary | ICD-10-CM

## 2019-01-11 DIAGNOSIS — D509 Iron deficiency anemia, unspecified: Secondary | ICD-10-CM

## 2019-01-11 DIAGNOSIS — K5641 Fecal impaction: Secondary | ICD-10-CM | POA: Diagnosis not present

## 2019-01-11 DIAGNOSIS — Z79899 Other long term (current) drug therapy: Secondary | ICD-10-CM

## 2019-01-11 DIAGNOSIS — K279 Peptic ulcer, site unspecified, unspecified as acute or chronic, without hemorrhage or perforation: Secondary | ICD-10-CM | POA: Diagnosis not present

## 2019-01-11 DIAGNOSIS — Z806 Family history of leukemia: Secondary | ICD-10-CM

## 2019-01-11 DIAGNOSIS — Z8249 Family history of ischemic heart disease and other diseases of the circulatory system: Secondary | ICD-10-CM

## 2019-01-11 DIAGNOSIS — R933 Abnormal findings on diagnostic imaging of other parts of digestive tract: Secondary | ICD-10-CM | POA: Diagnosis not present

## 2019-01-11 LAB — CBC WITH DIFFERENTIAL/PLATELET
Abs Immature Granulocytes: 0.16 10*3/uL — ABNORMAL HIGH (ref 0.00–0.07)
Basophils Absolute: 0 10*3/uL (ref 0.0–0.1)
Basophils Relative: 0 %
Eosinophils Absolute: 0.1 10*3/uL (ref 0.0–0.5)
Eosinophils Relative: 1 %
HCT: 13.4 % — ABNORMAL LOW (ref 36.0–46.0)
Hemoglobin: 3.8 g/dL — CL (ref 12.0–15.0)
Immature Granulocytes: 1 %
Lymphocytes Relative: 7 %
Lymphs Abs: 0.9 10*3/uL (ref 0.7–4.0)
MCH: 24.1 pg — ABNORMAL LOW (ref 26.0–34.0)
MCHC: 28.4 g/dL — ABNORMAL LOW (ref 30.0–36.0)
MCV: 84.8 fL (ref 80.0–100.0)
Monocytes Absolute: 0.5 10*3/uL (ref 0.1–1.0)
Monocytes Relative: 4 %
Neutro Abs: 10.5 10*3/uL — ABNORMAL HIGH (ref 1.7–7.7)
Neutrophils Relative %: 87 %
Platelets: 347 10*3/uL (ref 150–400)
RBC: 1.58 MIL/uL — ABNORMAL LOW (ref 3.87–5.11)
RDW: 17.2 % — ABNORMAL HIGH (ref 11.5–15.5)
WBC: 12.2 10*3/uL — ABNORMAL HIGH (ref 4.0–10.5)
nRBC: 0 % (ref 0.0–0.2)

## 2019-01-11 LAB — COMPREHENSIVE METABOLIC PANEL
ALT: 8 U/L (ref 0–44)
AST: 11 U/L — ABNORMAL LOW (ref 15–41)
Albumin: 1.9 g/dL — ABNORMAL LOW (ref 3.5–5.0)
Alkaline Phosphatase: 51 U/L (ref 38–126)
Anion gap: 12 (ref 5–15)
BUN: 25 mg/dL — ABNORMAL HIGH (ref 8–23)
CO2: 19 mmol/L — ABNORMAL LOW (ref 22–32)
Calcium: 7.2 mg/dL — ABNORMAL LOW (ref 8.9–10.3)
Chloride: 109 mmol/L (ref 98–111)
Creatinine, Ser: 1.5 mg/dL — ABNORMAL HIGH (ref 0.44–1.00)
GFR calc Af Amer: 39 mL/min — ABNORMAL LOW (ref >60)
GFR calc non Af Amer: 34 mL/min — ABNORMAL LOW (ref >60)
Glucose, Bld: 178 mg/dL — ABNORMAL HIGH (ref 70–99)
Potassium: 4 mmol/L (ref 3.5–5.1)
Sodium: 140 mmol/L (ref 135–145)
Total Bilirubin: 0.4 mg/dL (ref 0.3–1.2)
Total Protein: 4.2 g/dL — ABNORMAL LOW (ref 6.5–8.1)

## 2019-01-11 LAB — IRON AND TIBC
Iron: <5 ug/dL — ABNORMAL LOW (ref 28–170)
TIBC: 237 ug/dL — ABNORMAL LOW (ref 250–450)

## 2019-01-11 LAB — RETICULOCYTES
Immature Retic Fract: 28.3 % — ABNORMAL HIGH (ref 2.3–15.9)
RBC.: 1.62 MIL/uL — ABNORMAL LOW (ref 3.87–5.11)
Retic Count, Absolute: 58.8 10*3/uL (ref 19.0–186.0)
Retic Ct Pct: 3.6 % — ABNORMAL HIGH (ref 0.4–3.1)

## 2019-01-11 LAB — TROPONIN I (HIGH SENSITIVITY): Troponin I (High Sensitivity): 7 ng/L (ref <18)

## 2019-01-11 LAB — PREPARE RBC (CROSSMATCH)

## 2019-01-11 LAB — PROTIME-INR
INR: 1.2 (ref 0.8–1.2)
Prothrombin Time: 15.2 seconds (ref 11.4–15.2)

## 2019-01-11 LAB — VITAMIN B12: Vitamin B-12: 220 pg/mL (ref 180–914)

## 2019-01-11 LAB — FERRITIN: Ferritin: 9 ng/mL — ABNORMAL LOW (ref 11–307)

## 2019-01-11 LAB — FOLATE: Folate: 6.8 ng/mL (ref >5.9)

## 2019-01-11 LAB — LIPASE, BLOOD: Lipase: 29 U/L (ref 11–51)

## 2019-01-11 LAB — SARS CORONAVIRUS 2 BY RT PCR (HOSPITAL ORDER, PERFORMED IN ~~LOC~~ HOSPITAL LAB): SARS Coronavirus 2: NEGATIVE

## 2019-01-11 LAB — POC OCCULT BLOOD, ED: Fecal Occult Bld: POSITIVE — AB

## 2019-01-11 MED ORDER — SODIUM CHLORIDE 0.9 % IV SOLN
1.0000 g | Freq: Once | INTRAVENOUS | Status: AC
  Administered 2019-01-11: 22:00:00 1 g via INTRAVENOUS
  Filled 2019-01-11: qty 10

## 2019-01-11 MED ORDER — SODIUM CHLORIDE 0.9 % IV BOLUS
500.0000 mL | Freq: Once | INTRAVENOUS | Status: AC
  Administered 2019-01-11: 500 mL via INTRAVENOUS

## 2019-01-11 MED ORDER — SODIUM CHLORIDE 0.9 % IV SOLN
10.0000 mL/h | Freq: Once | INTRAVENOUS | Status: AC
  Administered 2019-01-12: 22:00:00 10 mL/h via INTRAVENOUS

## 2019-01-11 MED ORDER — IOHEXOL 300 MG/ML  SOLN
100.0000 mL | Freq: Once | INTRAMUSCULAR | Status: AC | PRN
  Administered 2019-01-11: 23:00:00 100 mL via INTRAVENOUS

## 2019-01-11 MED ORDER — SODIUM CHLORIDE 0.9 % IV SOLN
80.0000 mg | Freq: Once | INTRAVENOUS | Status: AC
  Administered 2019-01-11: 80 mg via INTRAVENOUS
  Filled 2019-01-11: qty 80

## 2019-01-11 NOTE — ED Provider Notes (Signed)
Emergency Department Provider Note   I have reviewed the triage vital signs and the nursing notes.   HISTORY  Chief Complaint Hypotension, Emesis, Diarrhea, and Abdominal Pain   HPI Angelica Kelley is a 73 y.o. female presents to the emergency department for evaluation of nausea, vomiting, diarrhea with black material reported.  Patient is a resident at [facility] or [facility].  She takes aspirin but is not anticoagulated.  She tells me that today she ate some bologna and then began having vomiting shortly afterwards.  She denies abdominal pain during this.  No chest pain or shortness of breath.  She denies feeling lightheaded.  She states she had multiple episodes of vomiting as well as diarrhea, both of which were black type material.  She denies fevers. She reports occasional drinking EtOH.  EMS arrived on scene to find the patient hypotensive with blood pressure of 80/30.  They gave 800 cc IV fluid and 4 mg Zofran en route.   Past Medical History:  Diagnosis Date  . Alcoholism ([place])   . Anxiety   . Atrial fibrillation ([place]) 09/07/2017   CHADS2Vasc of 4  Patient is not a candidate for anticoagulation at present primarily due to her inability to care for self at home and unwillingness to allow supervision (an ongong issue, APS has been involved).  If this were to change (I.e. She were to enter assisted living or have live in aide), considerations may change, and anticoagulation would be recommended.    She is alternately on ASA 32  . CHF (congestive heart failure) ([place])   . Depression   . Elevated hemoglobin A1c   . Fracture of right wrist   . GERD (gastroesophageal reflux disease)   . Heart murmur   . Hyperlipidemia   . Hypertension   . IBS (irritable bowel syndrome)   . TIA (transient ischemic attack) 06/18/2017    Patient Active Problem List   Diagnosis Date Noted  . Acute blood loss anemia 01/11/2019  . Syncope 10/12/2017  . Benzodiazepine overdose 09/07/2017  . Dementia  ([place]) 09/07/2017  . Chronic diastolic heart failure ([place]) 09/07/2017  . Chronic anemia 09/07/2017  . Hypokalemia 08/19/2017  . Depression with anxiety 05/09/2017  . Fall 05/09/2017  . Slurred speech 05/09/2017  . CKD (chronic kidney disease) stage 2, GFR 60-89 ml/min 04/23/2017  . OAB (overactive bladder) 06/09/2014  . Vitamin D deficiency 10/27/2013  . Medication management 10/27/2013  . Hypertension   . Hyperlipidemia, mixed   . GERD (gastroesophageal reflux disease)   . Bipolar depression ([place])   . Alcoholism ([place])   . IBS (irritable bowel syndrome)   . Other abnormal glucose     Past Surgical History:  Procedure Laterality Date  . ABDOMINAL HYSTERECTOMY  1991  . APPENDECTOMY    . BREAST BIOPSY Right 05/2016   fat necrosis  . BREAST SURGERY Left 1989   [facility]  . CARPAL TUNNEL RELEASE Right 10/18/2014   Procedure: CARPAL TUNNEL RELEASE;  Surgeon: Dorna Leitz, MD;  Location: [facility];  Service: Orthopedics;  Laterality: Right;  . [place]  . NECK SURGERY  Remote    Ruptured disc, per patient. Got infected.   . ORIF WRIST FRACTURE Right 10/18/2014   Procedure: OPEN REDUCTION INTERNAL FIXATION (ORIF) RIGHT WRIST FRACTURE;  Surgeon: Dorna Leitz, MD;  Location: [facility];  Service: Orthopedics;  Laterality: Right;  . TONSILLECTOMY  1965  . TUBAL LIGATION      Allergies Lipitor [atorvastatin] and Prednisone  Family History  Problem Relation Age of Onset  . Heart disease Mother   . Diabetes Mother   . Leukemia Father   . Heart disease Brother   . Cancer Brother   . Parkinson's disease Brother     Social History Social History   Tobacco Use  . Smoking status: Former Smoker    Types: Cigarettes    Quit date: 05/14/2010    Years since quitting: 8.6  . Smokeless tobacco: Never Used  . Tobacco comment: second hand smoke exposure also  Substance Use Topics  . Alcohol use: Not Currently    Comment: Quit 2002, denies she ever abused it.  . Drug use: No     Review of Systems  Constitutional: No fever/chills Eyes: No visual changes. ENT: No sore throat. Cardiovascular: Denies chest pain. Respiratory: Denies shortness of breath. Gastrointestinal: No abdominal pain. Positive nausea, vomiting, and diarrhea.  No constipation. Genitourinary: Negative for dysuria. Musculoskeletal: Negative for back pain. Skin: Negative for rash. Neurological: Negative for headaches, focal weakness or numbness.  10-point ROS otherwise negative.  ____________________________________________   PHYSICAL EXAM:  VITAL SIGNS: ED Triage Vitals  Enc Vitals Group     BP 01/11/19 2131 (!) 98/44     Pulse Rate 01/11/19 2140 74     Resp --      Temp 01/11/19 2140 97.8 F (36.6 C)     Temp Source 01/11/19 2140 Oral     SpO2 01/11/19 2133 98 %   Constitutional: Alert and oriented. Well appearing and in no acute distress. Eyes: Conjunctivae are normal. Head: Atraumatic. Nose: No congestion/rhinnorhea. Mouth/Throat: Mucous membranes are moist.  Neck: No stridor.  Cardiovascular: Normal rate, regular rhythm. Good peripheral circulation. Grossly normal heart sounds.   Respiratory: Normal respiratory effort.  No retractions. Lungs CTAB. Gastrointestinal: Soft and nontender. No distention.  Rectal exam performed with nurse chaperone.  There is melanotic stool. No BRB.  Musculoskeletal: No lower extremity tenderness nor edema. Neurologic:  Normal speech and language.  Skin:  Skin is warm, dry and intact. No rash noted.  ____________________________________________   LABS (all labs ordered are listed, but only abnormal results are displayed)  Labs Reviewed  COMPREHENSIVE METABOLIC PANEL - Abnormal; Notable for the following components:      Result Value   CO2 19 (*)    Glucose, Bld 178 (*)    BUN 25 (*)    Creatinine, Ser 1.50 (*)    Calcium 7.2 (*)    Total Protein 4.2 (*)    Albumin 1.9 (*)    AST 11 (*)    GFR calc non Af Amer 34 (*)    GFR calc  Af Amer 39 (*)    All other components within normal limits  CBC WITH DIFFERENTIAL/PLATELET - Abnormal; Notable for the following components:   WBC 12.2 (*)    RBC 1.58 (*)    Hemoglobin 3.8 (*)    HCT 13.4 (*)    MCH 24.1 (*)    MCHC 28.4 (*)    RDW 17.2 (*)    Neutro Abs 10.5 (*)    Abs Immature Granulocytes 0.16 (*)    All other components within normal limits  IRON AND TIBC - Abnormal; Notable for the following components:   Iron <5 (*)    TIBC 237 (*)    All other components within normal limits  FERRITIN - Abnormal; Notable for the following components:   Ferritin 9 (*)    All other components within normal limits  RETICULOCYTES - Abnormal; Notable for the following components:   Retic Ct Pct 3.6 (*)    RBC. 1.62 (*)    Immature Retic Fract 28.3 (*)    All other components within normal limits  POC OCCULT BLOOD, ED - Abnormal; Notable for the following components:   Fecal Occult Bld POSITIVE (*)    All other components within normal limits  SARS CORONAVIRUS 2 (HOSPITAL ORDER, [facility])  LIPASE, BLOOD  PROTIME-INR  VITAMIN B12  FOLATE  GLUCOSE, CAPILLARY  PHOSPHORUS  MAGNESIUM  BASIC METABOLIC PANEL  CBC  TYPE AND SCREEN  PREPARE RBC (CROSSMATCH)  TROPONIN I (HIGH SENSITIVITY)  TROPONIN I (HIGH SENSITIVITY)   ____________________________________________  EKG  EKG reviewed. Narrow QRS. Nonspecific ST changes. No STEMI  ____________________________________________  RADIOLOGY  Ct Abdomen Pelvis W Contrast  Result Date: 01/11/2019 CLINICAL DATA:  Nausea, vomiting and diarrhea. EXAM: CT ABDOMEN AND PELVIS WITH CONTRAST TECHNIQUE: Multidetector CT imaging of the abdomen and pelvis was performed using the standard protocol following bolus administration of intravenous contrast. CONTRAST:  113mL OMNIPAQUE IOHEXOL 300 MG/ML  SOLN COMPARISON:  CT abdomen pelvis Oct 10, 2018 FINDINGS: Lower chest: Multifocal areas of patchy ground-glass  opacity present throughout the included lung bases. Cardiac silhouette is enlarged. No pericardial effusion. Aortic leaflet calcifications and calcification of the coronary arteries is noted. Included portion of the descending thoracic aorta is heavily calcified as well. Hepatobiliary: No focal liver abnormality is seen. No gallstones, gallbladder wall thickening, or frank biliary dilatation. Inflammation in the region the gallbladder fossa is favored to be reactive/related to the adjacent duodenal inflammation. Pancreas: Unremarkable. No pancreatic ductal dilatation or surrounding inflammatory changes. Spleen: Normal in size without focal abnormality. Adrenals/Urinary Tract: Adrenal glands are unremarkable. Kidneys are normal, without renal calculi, focal lesion, or hydronephrosis. Bladder is unremarkable. Stomach/Bowel: There is redemonstration of circumferential wall thickening of the antrum of the stomach and throughout the first through third portions of the duodenal sweep, now with increasing periduodenal inflammatory changes and trace reactive fluid tracking in the retroperitoneum and into the gallbladder fossa. No extraluminal gas. No organized collection or abscess. Distal small bowel has a more normal caliber. Normal appendix in the right lower quadrant. Moderate volume of stool through the colon. No colonic dilatation or wall thickening. Vascular/Lymphatic: Atherosclerotic plaque within the normal caliber aorta. Reactive adenopathy in the upper abdomen. Reproductive: Uterus is surgically absent. No concerning adnexal lesions. Other: Trace free fluid adjacent to the inflamed duodenum in the gastric antrum. No extraluminal gas or organized collection. No bowel containing hernias. Musculoskeletal: Chronic bilateral pars defects L4. Grade 1 anterolisthesis of L3 on L4, L4 on L5. Stable compression deformities of L3, L4 and L5. Background of diffuse discogenic and facet degenerative change most pronounced at  these lower lumbar levels. No acute or suspicious osseous lesions. IMPRESSION: 1. Persistent circumferential wall thickening of the antrum of the stomach and throughout the first through third portions of the duodenal sweep, now with increasing periduodenal inflammatory changes and trace reactive fluid tracking in the retroperitoneum and into the gallbladder fossa. Findings are consistent with worsening duodenitis and possible. No evidence of frank perforation or abscess formation. 2. Multifocal areas of patchy ground-glass opacity throughout the included lung bases, concerning for multifocal pneumonia. 3. Unchanged appearance of the multilevel compression deformities, spondylolysis and spondylolisthesis in the lower lumbar spine. 4. Aortic Atherosclerosis (ICD10-I70.0). Electronically Signed   By: Lovena Le M.D.   On: 01/11/2019 23:24    ____________________________________________  PROCEDURES  Procedure(s) performed:   Procedures  CRITICAL CARE Performed by: Margette Fast Total critical care time: 35 minutes Critical care time was exclusive of separately billable procedures and treating other patients. Critical care was necessary to treat or prevent imminent or life-threatening deterioration. Critical care was time spent personally by me on the following activities: development of treatment plan with patient and/or surrogate as well as nursing, discussions with consultants, evaluation of patient's response to treatment, examination of patient, obtaining history from patient or surrogate, ordering and performing treatments and interventions, ordering and review of laboratory studies, ordering and review of radiographic studies, pulse oximetry and re-evaluation of patient's condition.  Nanda Quinton, MD Emergency Medicine  ____________________________________________   INITIAL IMPRESSION / ASSESSMENT AND PLAN / ED COURSE  Pertinent labs & imaging results that were available during my  care of the patient were reviewed by me and considered in my medical decision making (see chart for details).   Patient presents to the emergency department for evaluation of likely upper GI bleed with coffee-ground emesis and melena on exam.  Hypotensive on scene but improved with IV fluids and Zofran in route.  No abdominal pain or tenderness.  Patient takes aspirin but not anticoagulated.  She is awake and alert.  She does appear somewhat pale.  Listed history of alcoholism in the chart but no known cirrhosis.  Will cover with Rocephin and start with Protonix/IVF. Sending type and screen. Will monitor BP closely. No emergency release PRBC at this time. No indication for massive transfusion protocol at this time.   10:45 PM  Spoke with Dr. Bryan Lemma with [facility]. He will eval in the AM. Hanging blood now.   CT abdomen/pelvis reviewed. No PNA symptoms. No additional abx for now.   Discussed patient's case with [facility] to request admission. Patient and family (if present) updated with plan. Care transferred to [facility] service.  I reviewed all nursing notes, vitals, pertinent old records, EKGs, labs, imaging (as available).  ____________________________________________  FINAL CLINICAL IMPRESSION(S) / ED DIAGNOSES  Final diagnoses:  Hypotension due to hypovolemia  Hematemesis with nausea  Melena  Symptomatic anemia     MEDICATIONS GIVEN DURING THIS VISIT:  Medications  0.9 %  sodium chloride infusion (10 mL/hr Intravenous Not Given 01/12/19 0331)  0.9 %  sodium chloride infusion ( Intravenous Not Given 01/12/19 0332)  rosuvastatin (CRESTOR) tablet 40 mg (has no administration in time range)  ARIPiprazole (ABILIFY) tablet 5 mg (5 mg Oral Given 01/12/19 0957)  donepezil (ARICEPT) tablet 10 mg (10 mg Oral Given 01/12/19 0330)  sertraline (ZOLOFT) tablet 200 mg (200 mg Oral Given 01/12/19 0956)  clonazePAM (KLONOPIN) tablet 0.5 mg (has no administration in time range)  gabapentin (NEURONTIN) capsule  200 mg (200 mg Oral Given 01/12/19 0958)  lamoTRIgine (LAMICTAL) tablet 400 mg (400 mg Oral Given 01/12/19 0957)  cholecalciferol (VITAMIN D3) tablet 1,000 Units (has no administration in time range)  thiamine (VITAMIN B-1) tablet 250 mg (has no administration in time range)  sodium chloride flush (NS) 0.9 % injection 3 mL (3 mLs Intravenous Given 01/12/19 0332)  acetaminophen (TYLENOL) tablet 650 mg (650 mg Oral Given 01/12/19 0956)    Or  acetaminophen (TYLENOL) suppository 650 mg ( Rectal See Alternative 01/12/19 0956)  ondansetron (ZOFRAN) tablet 4 mg (has no administration in time range)    Or  ondansetron (ZOFRAN) injection 4 mg (has no administration in time range)  pantoprazole (PROTONIX) injection 40 mg (40 mg Intravenous Given 01/12/19 1006)  sodium  chloride 0.9 % bolus 500 mL (0 mLs Intravenous Stopped 01/11/19 2321)  pantoprazole (PROTONIX) 80 mg in sodium chloride 0.9 % 100 mL IVPB (0 mg Intravenous Stopped 01/11/19 2255)  cefTRIAXone (ROCEPHIN) 1 g in sodium chloride 0.9 % 100 mL IVPB (0 g Intravenous Stopped 01/11/19 2254)  iohexol (OMNIPAQUE) 300 MG/ML solution 100 mL (100 mLs Intravenous Contrast Given 01/11/19 2254)    Note:  This document was prepared using Dragon voice recognition software and may include unintentional dictation errors.  Nanda Quinton, MD Emergency Medicine    Roselyn Doby, [facility], MD 01/12/19 1016

## 2019-01-11 NOTE — ED Notes (Signed)
Dr. Laverta Baltimore made aware of pt's hgb of 3.8.

## 2019-01-11 NOTE — ED Notes (Addendum)
ED TO INPATIENT HANDOFF REPORT  ED Nurse Name and Phone #:  [phone]  S Name/Age/Gender Angelica Kelley 73 y.o. female Room/Bed: 032C/032C  Code Status   Code Status: Prior  Home/SNF/Other [facility] [place] Patient oriented to: self, place, time and situation Is this baseline? Yes   Triage Complete: Triage complete  Chief Complaint N/V/D, Hypotension  Triage Note Per [facility], pt from [facility] w/ a c/o N/V/D, abd pain, hypotension, and dizziness since dinner. Emesis noted to present as coffee grounds and stool is dark and tarry.. Pt experiences dizziness with changes in position, and fell after getting up from the toilet. She denies pain or injury. No head, neck, or back pain. No LOC.   Initial BP: 20/36 --> 90/40 after 800 cc NaCl HR 72 RR 18 98% RA 20 ga L AC 4 mg Zofran   Allergies Allergies  Allergen Reactions  . Lipitor [Atorvastatin]     Fatigue  . Prednisone Other (See Comments)    Change in mental status    Level of Care/Admitting Diagnosis ED Disposition    ED Disposition Condition [facility] Area: [place] [[id number]]  Level of Care: Progressive [102]  Covid Evaluation: Confirmed COVID Negative  Diagnosis: Acute blood loss anemia [id number]  Admitting Physician: Bennie Pierini [id number]  Attending Physician: Jonnie Finner, [place] [[id number]]  Estimated length of stay: past midnight tomorrow  Certification:: I certify this patient will need inpatient services for at least 2 midnights  PT Class (Do Not Modify): Inpatient [101]  PT Acc Code (Do Not Modify): Private [1]       B Medical/Surgery History Past Medical History:  Diagnosis Date  . Alcoholism ([place])   . Anxiety   . Atrial fibrillation ([place]) 09/07/2017   CHADS2Vasc of 4  Patient is not a candidate for anticoagulation at present primarily due to her inability to care for self at home and unwillingness to allow supervision (an ongong issue, APS  has been involved).  If this were to change (I.e. She were to enter assisted living or have live in aide), considerations may change, and anticoagulation would be recommended.    She is alternately on ASA 32  . CHF (congestive heart failure) ([place])   . Depression   . Elevated hemoglobin A1c   . Fracture of right wrist   . GERD (gastroesophageal reflux disease)   . Heart murmur   . Hyperlipidemia   . Hypertension   . IBS (irritable bowel syndrome)   . TIA (transient ischemic attack) 06/18/2017   Past Surgical History:  Procedure Laterality Date  . ABDOMINAL HYSTERECTOMY  1991  . APPENDECTOMY    . BREAST BIOPSY Right 05/2016   fat necrosis  . BREAST SURGERY Left 1989   [facility]  . CARPAL TUNNEL RELEASE Right 10/18/2014   Procedure: CARPAL TUNNEL RELEASE;  Surgeon: Dorna Leitz, MD;  Location: [facility];  Service: Orthopedics;  Laterality: Right;  . [place]  . NECK SURGERY  Remote    Ruptured disc, per patient. Got infected.   . ORIF WRIST FRACTURE Right 10/18/2014   Procedure: OPEN REDUCTION INTERNAL FIXATION (ORIF) RIGHT WRIST FRACTURE;  Surgeon: Dorna Leitz, MD;  Location: [facility];  Service: Orthopedics;  Laterality: Right;  . TONSILLECTOMY  1965  . TUBAL LIGATION       A IV Location/Drains/Wounds Patient Lines/Drains/Airways Status   Active Line/Drains/Airways    Name:   Placement date:   Placement time:  Site:   Days:   Peripheral IV 01/11/19 Left Antecubital   01/11/19    2133    Antecubital   less than 1   Peripheral IV 01/11/19 Right Antecubital   01/11/19    2215    Antecubital   less than 1   Incision (Closed) 10/13/17 Head Posterior   10/13/17    1638     455   Wound / Incision (Open or Dehisced) 08/20/17 Other (Comment) Arm Left Skin tear with edges not approximated.   08/20/17    1730    Arm   509          Intake/Output Last 24 hours  Intake/Output Summary (Last 24 hours) at 01/11/2019 2358 Last data filed at 01/11/2019 2321 Gross per 24 hour  Intake  1015 ml  Output -  Net 1015 ml    Labs/Imaging Results for orders placed or performed during the hospital encounter of 01/11/19 (from the past 48 hour(s))  Comprehensive metabolic panel     Status: Abnormal   Collection Time: 01/11/19  9:35 PM  Result Value Ref Range   Sodium 140 135 - 145 mmol/L   Potassium 4.0 3.5 - 5.1 mmol/L   Chloride 109 98 - 111 mmol/L   CO2 19 (L) 22 - 32 mmol/L   Glucose, Bld 178 (H) 70 - 99 mg/dL   BUN 25 (H) 8 - 23 mg/dL   Creatinine, Ser 1.50 (H) 0.44 - 1.00 mg/dL   Calcium 7.2 (L) 8.9 - 10.3 mg/dL   Total Protein 4.2 (L) 6.5 - 8.1 g/dL   Albumin 1.9 (L) 3.5 - 5.0 g/dL   AST 11 (L) 15 - 41 U/L   ALT 8 0 - 44 U/L   Alkaline Phosphatase 51 38 - 126 U/L   Total Bilirubin 0.4 0.3 - 1.2 mg/dL   GFR calc non Af Amer 34 (L) >60 mL/min   GFR calc Af Amer 39 (L) >60 mL/min   Anion gap 12 5 - 15    Comment: Performed at [facility], [address] [place], [place] [address]  Lipase, blood     Status: None   Collection Time: 01/11/19  9:35 PM  Result Value Ref Range   Lipase 29 11 - 51 U/L    Comment: Performed at [facility] [address], [place], [place] [address]  CBC with Differential     Status: Abnormal   Collection Time: 01/11/19  9:35 PM  Result Value Ref Range   WBC 12.2 (H) 4.0 - 10.5 K/uL   RBC 1.58 (L) 3.87 - 5.11 MIL/uL   Hemoglobin 3.8 (LL) 12.0 - 15.0 g/dL    Comment: REPEATED TO VERIFY THIS CRITICAL RESULT HAS VERIFIED AND BEEN CALLED TO P.Mava Suares,RN BY MELISSA BROGDON ON 08 30 2020 AT 2227, AND HAS BEEN READ BACK.     HCT 13.4 (L) 36.0 - 46.0 %   MCV 84.8 80.0 - 100.0 fL   MCH 24.1 (L) 26.0 - 34.0 pg   MCHC 28.4 (L) 30.0 - 36.0 g/dL   RDW 17.2 (H) 11.5 - 15.5 %   Platelets 347 150 - 400 K/uL   nRBC 0.0 0.0 - 0.2 %   Neutrophils Relative % 87 %   Neutro Abs 10.5 (H) 1.7 - 7.7 K/uL   Lymphocytes Relative 7 %   Lymphs Abs 0.9 0.7 - 4.0 K/uL   Monocytes Relative 4 %   Monocytes Absolute 0.5 0.1 - 1.0 K/uL  Eosinophils Relative 1 %   Eosinophils Absolute 0.1 0.0 - 0.5 K/uL   Basophils Relative 0 %   Basophils Absolute 0.0 0.0 - 0.1 K/uL   Immature Granulocytes 1 %   Abs Immature Granulocytes 0.16 (H) 0.00 - 0.07 K/uL    Comment: Performed at [facility] [address], [place], [place] [address]  Troponin I (High Sensitivity)     Status: None   Collection Time: 01/11/19  9:35 PM  Result Value Ref Range   Troponin I (High Sensitivity) 7 <18 ng/L    Comment: (NOTE) Elevated high sensitivity troponin I (hsTnI) values and significant  changes across serial measurements may suggest ACS but many other  chronic and acute conditions are known to elevate hsTnI results.  Refer to the "Links" section for chest pain algorithms and additional  guidance. Performed at [facility], [place] [address], [place], [place] [address]   Protime-INR     Status: None   Collection Time: 01/11/19  9:35 PM  Result Value Ref Range   Prothrombin Time 15.2 11.4 - 15.2 seconds   INR 1.2 0.8 - 1.2    Comment: (NOTE) INR goal varies based on device and disease states. Performed at [facility], [place] [address], [place], [place] [address]   Vitamin B12     Status: None   Collection Time: 01/11/19  9:35 PM  Result Value Ref Range   Vitamin B-12 220 180 - 914 pg/mL    Comment: Performed at [facility], [place] [address], [place], [place] [address]  Folate     Status: None   Collection Time: 01/11/19  9:35 PM  Result Value Ref Range   Folate 6.8 >5.9 ng/mL    Comment: Performed at [facility] [address] [place], [place] [address]  Iron and TIBC     Status: Abnormal   Collection Time: 01/11/19  9:35 PM  Result Value Ref Range   Iron <5 (L) 28 - 170 ug/dL   TIBC 237 (L) 250 - 450 ug/dL   Saturation Ratios NOT CALCULATED 10.4 - 31.8 %   UIBC NOT CALCULATED ug/dL    Comment: Performed at [facility] [address], [place], [place] [address]  Ferritin     Status: Abnormal    Collection Time: 01/11/19  9:35 PM  Result Value Ref Range   Ferritin 9 (L) 11 - 307 ng/mL    Comment: Performed at [facility], [place] [address] [place], [place] [address]  Reticulocytes     Status: Abnormal   Collection Time: 01/11/19  9:35 PM  Result Value Ref Range   Retic Ct Pct 3.6 (H) 0.4 - 3.1 %   RBC. 1.62 (L) 3.87 - 5.11 MIL/uL   Retic Count, Absolute 58.8 19.0 - 186.0 K/uL   Immature Retic Fract 28.3 (H) 2.3 - 15.9 %    Comment: Performed at [facility] [address], [place], [place] [address]  Type and screen [place]     Status: None (Preliminary result)   Collection Time: 01/11/19  9:40 PM  Result Value Ref Range   ABO/RH(D) A POS    Antibody Screen NEG    Sample Expiration 01/14/2019,2359    Unit Number [id number]    Blood Component Type RED CELLS,LR    Unit division 00    Status of Unit ISSUED    Transfusion Status OK TO TRANSFUSE  Crossmatch Result      Compatible Performed at [facility], [place] [address] [place], [place] [address]    Unit Number [id number]    Blood Component Type RED CELLS,LR    Unit division 00    Status of Unit ALLOCATED    Transfusion Status OK TO TRANSFUSE    Crossmatch Result Compatible    Unit Number [id number]    Blood Component Type RED CELLS,LR    Unit division 00    Status of Unit ALLOCATED    Transfusion Status OK TO TRANSFUSE    Crossmatch Result Compatible    Unit Number [id number]    Blood Component Type RED CELLS,LR    Unit division 00    Status of Unit ALLOCATED    Transfusion Status OK TO TRANSFUSE    Crossmatch Result Compatible   POC occult blood, ED Provider will collect     Status: Abnormal   Collection Time: 01/11/19  9:50 PM  Result Value Ref Range   Fecal Occult Bld POSITIVE (A) NEGATIVE  SARS Coronavirus 2 [facility] order, Performed in [facility] hospital lab) Nasopharyngeal Nasopharyngeal Swab     Status: None   Collection Time: 01/11/19 10:09 PM    Specimen: Nasopharyngeal Swab  Result Value Ref Range   SARS Coronavirus 2 NEGATIVE NEGATIVE    Comment: (NOTE) If result is NEGATIVE SARS-CoV-2 target nucleic acids are NOT DETECTED. The SARS-CoV-2 RNA is generally detectable in upper and lower  respiratory specimens during the acute phase of infection. The lowest  concentration of SARS-CoV-2 viral copies this assay can detect is 250  copies / mL. A negative result does not preclude SARS-CoV-2 infection  and should not be used as the sole basis for treatment or other  patient management decisions.  A negative result may occur with  improper specimen collection / handling, submission of specimen other  than nasopharyngeal swab, presence of viral mutation(s) within the  areas targeted by this assay, and inadequate number of viral copies  (<250 copies / mL). A negative result must be combined with clinical  observations, patient history, and epidemiological information. If result is POSITIVE SARS-CoV-2 target nucleic acids are DETECTED. The SARS-CoV-2 RNA is generally detectable in upper and lower  respiratory specimens dur ing the acute phase of infection.  Positive  results are indicative of active infection with SARS-CoV-2.  Clinical  correlation with patient history and other diagnostic information is  necessary to determine patient infection status.  Positive results do  not rule out bacterial infection or co-infection with other viruses. If result is PRESUMPTIVE POSTIVE SARS-CoV-2 nucleic acids MAY BE PRESENT.   A presumptive positive result was obtained on the submitted specimen  and confirmed on repeat testing.  While 2019 novel coronavirus  (SARS-CoV-2) nucleic acids may be present in the submitted sample  additional confirmatory testing may be necessary for epidemiological  and / or clinical management purposes  to differentiate between  SARS-CoV-2 and other Sarbecovirus currently known to infect humans.  If clinically  indicated additional testing with an alternate test  methodology [phone]) is advised. The SARS-CoV-2 RNA is generally  detectable in upper and lower respiratory sp ecimens during the acute  phase of infection. The expected result is Negative. Fact Sheet for Patients:  StrictlyIdeas.no Fact Sheet for Healthcare Providers: BankingDealers.co.za This test is not yet approved or cleared by the [place] FDA and has been authorized for detection and/or diagnosis of SARS-CoV-2 by FDA under an Emergency Use Authorization (EUA).  This EUA will remain in effect (meaning this test can be used) for the duration of the COVID-19 declaration under Section 564(b)(1) of the Act, 21 U.S.C. section 360bbb-3(b)(1), unless the authorization is terminated or revoked sooner. Performed at [facility], [place] [address], [place], [place] [address]   Prepare RBC     Status: None   Collection Time: 01/11/19 10:36 PM  Result Value Ref Range   Order Confirmation      ORDER PROCESSED BY BLOOD BANK Performed at [facility], [place] [address], [place], [place] [address]    Ct Abdomen Pelvis W Contrast  Result Date: 01/11/2019 CLINICAL DATA:  Nausea, vomiting and diarrhea. EXAM: CT ABDOMEN AND PELVIS WITH CONTRAST TECHNIQUE: Multidetector CT imaging of the abdomen and pelvis was performed using the standard protocol following bolus administration of intravenous contrast. CONTRAST:  170mL OMNIPAQUE IOHEXOL 300 MG/ML  SOLN COMPARISON:  CT abdomen pelvis Oct 10, 2018 FINDINGS: Lower chest: Multifocal areas of patchy ground-glass opacity present throughout the included lung bases. Cardiac silhouette is enlarged. No pericardial effusion. Aortic leaflet calcifications and calcification of the coronary arteries is noted. Included portion of the descending thoracic aorta is heavily calcified as well. Hepatobiliary: No focal liver abnormality is seen. No gallstones,  gallbladder wall thickening, or frank biliary dilatation. Inflammation in the region the gallbladder fossa is favored to be reactive/related to the adjacent duodenal inflammation. Pancreas: Unremarkable. No pancreatic ductal dilatation or surrounding inflammatory changes. Spleen: Normal in size without focal abnormality. Adrenals/Urinary Tract: Adrenal glands are unremarkable. Kidneys are normal, without renal calculi, focal lesion, or hydronephrosis. Bladder is unremarkable. Stomach/Bowel: There is redemonstration of circumferential wall thickening of the antrum of the stomach and throughout the first through third portions of the duodenal sweep, now with increasing periduodenal inflammatory changes and trace reactive fluid tracking in the retroperitoneum and into the gallbladder fossa. No extraluminal gas. No organized collection or abscess. Distal small bowel has a more normal caliber. Normal appendix in the right lower quadrant. Moderate volume of stool through the colon. No colonic dilatation or wall thickening. Vascular/Lymphatic: Atherosclerotic plaque within the normal caliber aorta. Reactive adenopathy in the upper abdomen. Reproductive: Uterus is surgically absent. No concerning adnexal lesions. Other: Trace free fluid adjacent to the inflamed duodenum in the gastric antrum. No extraluminal gas or organized collection. No bowel containing hernias. Musculoskeletal: Chronic bilateral pars defects L4. Grade 1 anterolisthesis of L3 on L4, L4 on L5. Stable compression deformities of L3, L4 and L5. Background of diffuse discogenic and facet degenerative change most pronounced at these lower lumbar levels. No acute or suspicious osseous lesions. IMPRESSION: 1. Persistent circumferential wall thickening of the antrum of the stomach and throughout the first through third portions of the duodenal sweep, now with increasing periduodenal inflammatory changes and trace reactive fluid tracking in the retroperitoneum and  into the gallbladder fossa. Findings are consistent with worsening duodenitis and possible. No evidence of frank perforation or abscess formation. 2. Multifocal areas of patchy ground-glass opacity throughout the included lung bases, concerning for multifocal pneumonia. 3. Unchanged appearance of the multilevel compression deformities, spondylolysis and spondylolisthesis in the lower lumbar spine. 4. Aortic Atherosclerosis (ICD10-I70.0). Electronically Signed   By: Lovena Le M.D.   On: 01/11/2019 23:24    Pending Labs [employer] (From admission, onward)    Start     Ordered   Signed and Held  Phosphorus  Add-on,   R     Signed and Held   Signed and  Held  Magnesium  Add-on,   R     Signed and Held   Signed and Held  Basic metabolic panel  Tomorrow morning,   R     Signed and Held   Signed and Held  CBC  Tomorrow morning,   R     Signed and Held          Vitals/Pain Today's Vitals   01/11/19 2321 01/11/19 2330 01/11/19 2331 01/11/19 2342  BP: (!) 101/43 (!) 113/50 (!) 101/43 (!) 109/40  Pulse:  76 74 76  Resp: 18 (!) 25 18 16   Temp: 97.7 F (36.5 C)   97.7 F (36.5 C)  TempSrc:   Oral Oral  SpO2:  100% 100%   PainSc:        Isolation Precautions No active isolations  Medications Medications  0.9 %  sodium chloride infusion (has no administration in time range)  sodium chloride 0.9 % bolus 500 mL (0 mLs Intravenous Stopped 01/11/19 2321)  pantoprazole (PROTONIX) 80 mg in sodium chloride 0.9 % 100 mL IVPB (0 mg Intravenous Stopped 01/11/19 2255)  cefTRIAXone (ROCEPHIN) 1 g in sodium chloride 0.9 % 100 mL IVPB (0 g Intravenous Stopped 01/11/19 2254)  iohexol (OMNIPAQUE) 300 MG/ML solution 100 mL (100 mLs Intravenous Contrast Given 01/11/19 2254)    Mobility walks with device Moderate fall risk   Focused Assessments    R Recommendations: See Admitting Provider Note  Report given to:   Additional Notes:

## 2019-01-11 NOTE — ED Triage Notes (Addendum)
Per [facility], pt from [facility] w/ a c/o N/V/D, abd pain, hypotension, and dizziness since dinner. Emesis noted to present as coffee grounds and stool is dark and tarry.. Pt experiences dizziness with changes in position, and fell after getting up from the toilet. She denies pain or injury. No head, neck, or back pain. No LOC.   Initial BP: 80/36 --> 90/40 after 800 cc NaCl HR 72 RR 18 98% RA 20 ga L AC 4 mg Zofran

## 2019-01-12 ENCOUNTER — Inpatient Hospital Stay (HOSPITAL_COMMUNITY): Payer: Medicare Other | Admitting: Certified Registered"

## 2019-01-12 ENCOUNTER — Encounter (HOSPITAL_COMMUNITY): Payer: Self-pay | Admitting: *Deleted

## 2019-01-12 ENCOUNTER — Encounter (HOSPITAL_COMMUNITY)
Admission: EM | Disposition: A | Discharge status: Home / Self Care | Payer: Self-pay | Source: Home / Self Care | Attending: Internal Medicine

## 2019-01-12 DIAGNOSIS — K259 Gastric ulcer, unspecified as acute or chronic, without hemorrhage or perforation: Secondary | ICD-10-CM

## 2019-01-12 DIAGNOSIS — K253 Acute gastric ulcer without hemorrhage or perforation: Secondary | ICD-10-CM

## 2019-01-12 DIAGNOSIS — D72829 Elevated white blood cell count, unspecified: Secondary | ICD-10-CM

## 2019-01-12 DIAGNOSIS — K264 Chronic or unspecified duodenal ulcer with hemorrhage: Secondary | ICD-10-CM

## 2019-01-12 DIAGNOSIS — D62 Acute posthemorrhagic anemia: Secondary | ICD-10-CM

## 2019-01-12 DIAGNOSIS — D649 Anemia, unspecified: Secondary | ICD-10-CM

## 2019-01-12 DIAGNOSIS — K262 Acute duodenal ulcer with both hemorrhage and perforation: Secondary | ICD-10-CM

## 2019-01-12 HISTORY — PX: ESOPHAGOGASTRODUODENOSCOPY: SHX5428

## 2019-01-12 HISTORY — DX: Anemia, unspecified: D64.9

## 2019-01-12 HISTORY — PX: ESOPHAGOGASTRODUODENOSCOPY: SHX1529

## 2019-01-12 HISTORY — PX: BIOPSY: SHX5522

## 2019-01-12 LAB — CBC
HCT: 27.5 % — ABNORMAL LOW (ref 36.0–46.0)
Hemoglobin: 9.2 g/dL — ABNORMAL LOW (ref 12.0–15.0)
MCH: 28.7 pg (ref 26.0–34.0)
MCHC: 33.5 g/dL (ref 30.0–36.0)
MCV: 85.7 fL (ref 80.0–100.0)
Platelets: 270 10*3/uL (ref 150–400)
RBC: 3.21 MIL/uL — ABNORMAL LOW (ref 3.87–5.11)
RDW: 14.6 % (ref 11.5–15.5)
WBC: 21.2 10*3/uL — ABNORMAL HIGH (ref 4.0–10.5)
nRBC: 0 % (ref 0.0–0.2)

## 2019-01-12 LAB — BASIC METABOLIC PANEL
Anion gap: 10 (ref 5–15)
BUN: 37 mg/dL — ABNORMAL HIGH (ref 8–23)
CO2: 20 mmol/L — ABNORMAL LOW (ref 22–32)
Calcium: 6.9 mg/dL — ABNORMAL LOW (ref 8.9–10.3)
Chloride: 110 mmol/L (ref 98–111)
Creatinine, Ser: 1.23 mg/dL — ABNORMAL HIGH (ref 0.44–1.00)
GFR calc Af Amer: 50 mL/min — ABNORMAL LOW (ref >60)
GFR calc non Af Amer: 43 mL/min — ABNORMAL LOW (ref >60)
Glucose, Bld: 109 mg/dL — ABNORMAL HIGH (ref 70–99)
Potassium: 4.8 mmol/L (ref 3.5–5.1)
Sodium: 140 mmol/L (ref 135–145)

## 2019-01-12 LAB — GLUCOSE, CAPILLARY
Glucose-Capillary: 94 mg/dL (ref 70–99)
Glucose-Capillary: 104 mg/dL — ABNORMAL HIGH (ref 70–99)

## 2019-01-12 LAB — MAGNESIUM: Magnesium: 2.3 mg/dL (ref 1.7–2.4)

## 2019-01-12 LAB — TROPONIN I (HIGH SENSITIVITY): Troponin I (High Sensitivity): 7 ng/L (ref <18)

## 2019-01-12 LAB — PROCALCITONIN: Procalcitonin: 0.25 ng/mL

## 2019-01-12 LAB — HEMOGLOBIN AND HEMATOCRIT, BLOOD
HCT: 26.8 % — ABNORMAL LOW (ref 36.0–46.0)
Hemoglobin: 9.1 g/dL — ABNORMAL LOW (ref 12.0–15.0)

## 2019-01-12 LAB — PHOSPHORUS: Phosphorus: 4.1 mg/dL (ref 2.5–4.6)

## 2019-01-12 SURGERY — EGD (ESOPHAGOGASTRODUODENOSCOPY)
Anesthesia: Monitor Anesthesia Care | Laterality: Left

## 2019-01-12 MED ORDER — VANCOMYCIN HCL IN DEXTROSE 750-5 MG/150ML-% IV SOLN
750.0000 mg | INTRAVENOUS | Status: DC
  Administered 2019-01-13 – 2019-01-15 (×3): 750 mg via INTRAVENOUS
  Filled 2019-01-12 (×4): qty 150

## 2019-01-12 MED ORDER — SODIUM CHLORIDE 0.9 % IV SOLN
8.0000 mg/h | INTRAVENOUS | Status: AC
  Administered 2019-01-12 – 2019-01-15 (×6): 8 mg/h via INTRAVENOUS
  Filled 2019-01-12 (×7): qty 80

## 2019-01-12 MED ORDER — SODIUM CHLORIDE 0.9% FLUSH
3.0000 mL | Freq: Two times a day (BID) | INTRAVENOUS | Status: DC
  Administered 2019-01-12 – 2019-01-20 (×14): 3 mL via INTRAVENOUS

## 2019-01-12 MED ORDER — PROPOFOL 500 MG/50ML IV EMUL
INTRAVENOUS | Status: DC | PRN
  Administered 2019-01-12: 125 ug/kg/min via INTRAVENOUS

## 2019-01-12 MED ORDER — VITAMIN D 25 MCG (1000 UNIT) PO TABS
1000.0000 [IU] | ORAL_TABLET | Freq: Every day | ORAL | Status: DC
  Administered 2019-01-12 – 2019-01-21 (×9): 1000 [IU] via ORAL
  Filled 2019-01-12 (×9): qty 1

## 2019-01-12 MED ORDER — GABAPENTIN 100 MG PO CAPS
200.0000 mg | ORAL_CAPSULE | Freq: Two times a day (BID) | ORAL | Status: DC
  Administered 2019-01-12 – 2019-01-21 (×20): 200 mg via ORAL
  Filled 2019-01-12 (×20): qty 2

## 2019-01-12 MED ORDER — ACETAMINOPHEN 650 MG RE SUPP: 650.0000 mg | Freq: Four times a day (QID) | RECTAL | Status: DC | PRN

## 2019-01-12 MED ORDER — SODIUM CHLORIDE 0.9 % IV SOLN
1.0000 g | INTRAVENOUS | Status: DC
  Administered 2019-01-13 – 2019-01-16 (×5): 1 g via INTRAVENOUS
  Filled 2019-01-12 (×2): qty 1
  Filled 2019-01-12 (×2): qty 10
  Filled 2019-01-12 (×2): qty 1

## 2019-01-12 MED ORDER — ARIPIPRAZOLE 2 MG PO TABS
5.0000 mg | ORAL_TABLET | Freq: Every day | ORAL | Status: DC
  Administered 2019-01-12 – 2019-01-21 (×10): 5 mg via ORAL
  Filled 2019-01-12 (×10): qty 3

## 2019-01-12 MED ORDER — ONDANSETRON HCL 4 MG PO TABS: 4.0000 mg | ORAL_TABLET | Freq: Four times a day (QID) | ORAL | Status: DC | PRN

## 2019-01-12 MED ORDER — ROSUVASTATIN CALCIUM 20 MG PO TABS
40.0000 mg | ORAL_TABLET | Freq: Every day | ORAL | Status: DC
  Administered 2019-01-12 – 2019-01-21 (×9): 40 mg via ORAL
  Filled 2019-01-12 (×9): qty 2

## 2019-01-12 MED ORDER — PANTOPRAZOLE SODIUM 40 MG IV SOLR
40.0000 mg | Freq: Two times a day (BID) | INTRAVENOUS | Status: DC
  Administered 2019-01-12: 40 mg via INTRAVENOUS
  Filled 2019-01-12: qty 40

## 2019-01-12 MED ORDER — PHENYLEPHRINE HCL (PRESSORS) 10 MG/ML IV SOLN
INTRAVENOUS | Status: DC | PRN
  Administered 2019-01-12: 80 ug via INTRAVENOUS
  Administered 2019-01-12: 120 ug via INTRAVENOUS
  Administered 2019-01-12: 80 ug via INTRAVENOUS

## 2019-01-12 MED ORDER — CLONAZEPAM 0.5 MG PO TABS
0.5000 mg | ORAL_TABLET | Freq: Every day | ORAL | Status: DC | PRN
  Administered 2019-01-12 – 2019-01-20 (×8): 0.5 mg via ORAL
  Filled 2019-01-12 (×8): qty 1

## 2019-01-12 MED ORDER — DONEPEZIL HCL 5 MG PO TABS
10.0000 mg | ORAL_TABLET | Freq: Every day | ORAL | Status: DC
  Administered 2019-01-12 – 2019-01-20 (×10): 10 mg via ORAL
  Filled 2019-01-12 (×10): qty 2

## 2019-01-12 MED ORDER — SERTRALINE HCL 100 MG PO TABS
200.0000 mg | ORAL_TABLET | Freq: Every day | ORAL | Status: DC
  Administered 2019-01-12 – 2019-01-21 (×10): 200 mg via ORAL
  Filled 2019-01-12 (×12): qty 2

## 2019-01-12 MED ORDER — VITAMIN B-1 100 MG PO TABS
250.0000 mg | ORAL_TABLET | Freq: Two times a day (BID) | ORAL | Status: DC
  Administered 2019-01-12 – 2019-01-21 (×18): 250 mg via ORAL
  Filled 2019-01-12 (×19): qty 3

## 2019-01-12 MED ORDER — LACTATED RINGERS IV SOLN
INTRAVENOUS | Status: DC | PRN
  Administered 2019-01-12: 13:00:00 via INTRAVENOUS

## 2019-01-12 MED ORDER — VANCOMYCIN HCL IN DEXTROSE 1-5 GM/200ML-% IV SOLN
1000.0000 mg | Freq: Once | INTRAVENOUS | Status: AC
  Administered 2019-01-12: 1000 mg via INTRAVENOUS
  Filled 2019-01-12: qty 200

## 2019-01-12 MED ORDER — ONDANSETRON HCL 4 MG/2ML IJ SOLN
4.0000 mg | Freq: Four times a day (QID) | INTRAMUSCULAR | Status: DC | PRN
  Administered 2019-01-15 (×2): 4 mg via INTRAVENOUS
  Filled 2019-01-12 (×2): qty 2

## 2019-01-12 MED ORDER — LAMOTRIGINE 100 MG PO TABS
400.0000 mg | ORAL_TABLET | Freq: Every day | ORAL | Status: DC
  Administered 2019-01-12 – 2019-01-21 (×10): 400 mg via ORAL
  Filled 2019-01-12 (×10): qty 4

## 2019-01-12 MED ORDER — SODIUM CHLORIDE 0.9 % IV SOLN: INTRAVENOUS | Status: DC

## 2019-01-12 MED ORDER — ACETAMINOPHEN 325 MG PO TABS
650.0000 mg | ORAL_TABLET | Freq: Four times a day (QID) | ORAL | Status: DC | PRN
  Administered 2019-01-12: 650 mg via ORAL
  Filled 2019-01-12: qty 2
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
## 2019-01-12 NOTE — Transfer of Care (Signed)
Immediate Anesthesia Transfer of Care Note  Patient: Angelica Kelley  Procedure(s) Performed: ESOPHAGOGASTRODUODENOSCOPY (EGD) (Left ) BIOPSY  Patient Location: Endoscopy Unit  Anesthesia Type:MAC  Level of Consciousness: drowsy and patient cooperative  Airway & Oxygen Therapy: Patient Spontanous Breathing and Patient connected to face mask  Post-op Assessment: Report given to RN, Post -op Vital signs reviewed and stable and Patient moving all extremities X 4  Post vital signs: Reviewed and stable  Last Vitals:  Vitals Value Taken Time  BP    Temp    Pulse    Resp    SpO2      Last Pain:  Vitals:   01/12/19 1245  TempSrc: Oral  PainSc: 7       Patients Stated Pain Goal: 2 (70/48/88 9169)  Complications: No apparent anesthesia complications

## 2019-01-12 NOTE — Progress Notes (Signed)
PROGRESS NOTE    Angelica Kelley  [id number] DOB: 08/02/1945 DOA: 01/11/2019 PCP: Unk Pinto, MD  Outpatient Specialists:   Brief Narrative:  Patient is a 73 year old Caucasian female with past medical history significant for dementia, TIA, hypertension, hyperlipidemia, diastolic congestive heart failure, atrial fibrillation, alcoholism and iron deficiency anemia.  Patient was admitted following a fall and reported hematemesis and melena stools.  On presentation to the hospital, hemoglobin was documented as 3.5 g/dL.  Last hemoglobin was 10 g/dL.  Other pertinent labs include serum creatinine had gone up from 0.75-1.5, albumin of 1.8, iron of less than 5, ferritin of 9, folate of 6.8 with INR of 1.2.  EGD done earlier today revealed: "Multiple non-bleeding gastric ulcers with no stigmata of bleeding.  Non-bleeding duodenal ulcer with adherent clot. This is at very high risk for re-bleeding".  Assessment & Plan:   Active Problems:   Acute blood loss anemia  Acute blood loss anemia 2/2 upper GI bleed (see EGD report above): -GI input is appreciated. -Continue Protonix drip. -For repeat EGD in about a week as per GI team. -Continue to monitor H/H closely. -Continue to hold ASA, VTE ppx  Gastric ulcer and duodenal ulcer: Currently see above.  Bibasilar groundglass opacities -Rising leukocytosis  -Repeat chest x-ray in the morning  -Panculture patient  -Check procalcitonin  -Nasal swab for MRSA  -Start antibiotics (de-escalate antibiotics if above is nonrevealing)  Chronic medical conditions: - Bipolar disorder: continue home Zoloft, Lamictal and Aricept daily, Klonopin once daily as needed - Overactive bladder: continue home Myrbetriq - HLD: continue home statin - Afib: not on rate control or anticoagulation, continue to monitor   DVT prophylaxis: SCD Code Status: Full code Family Communication:  Disposition Plan: Depend on hospital course   Consultants:    Gastroenterology  Procedures:   EGD  Antimicrobials:   IV Rocephin   Subjective: Patient is a poor historian due to dementing illness No new complaints No further bleeding elicited  Objective: Vitals:   01/12/19 0730 01/12/19 0802 01/12/19 0817 01/12/19 0822  BP: (!) 103/50 (!) 130/50 (!) 99/37 (!) 99/37  Pulse:   71 71  Resp: 17  (!) 21 (!) 21  Temp: 98 F (36.7 C) 98 F (36.7 C) 97.6 F (36.4 C) 97.6 F (36.4 C)  TempSrc: Oral Oral  Oral  SpO2: 100%   99%  Weight:        Intake/Output Summary (Last 24 hours) at 01/12/2019 0901 Last data filed at 01/12/2019 0800 Gross per 24 hour  Intake 2051 ml  Output 100 ml  Net 1951 ml   Filed Weights   01/12/19 0142  Weight: 63.2 kg    Examination:  General exam: Appears calm and comfortable.  Dry buccal mucosa. Respiratory system: Clear to auscultation. Respiratory effort normal. Cardiovascular system: S1 & S2 with systolic murmur.   Gastrointestinal system: Abdomen is nondistended, soft and nontender. No organomegaly or masses felt. Normal bowel sounds heard. Central nervous system: Alert and oriented. No focal neurological deficits. Extremities: Symmetric 5 x 5 power.  Data Reviewed: I have personally reviewed following labs and imaging studies  CBC: Recent Labs  Lab 01/11/19 2135  WBC 12.2*  NEUTROABS 10.5*  HGB 3.8*  HCT 13.4*  MCV 84.8  PLT [id number]   Basic Metabolic Panel: Recent Labs  Lab 01/11/19 2135  NA 140  K 4.0  CL 109  CO2 19*  GLUCOSE 178*  BUN 25*  CREATININE 1.50*  CALCIUM 7.2*   GFR: CrCl cannot  be calculated (Unknown ideal weight.). Liver Function Tests: Recent Labs  Lab 01/11/19 2135  AST 11*  ALT 8  ALKPHOS 51  BILITOT 0.4  PROT 4.2*  ALBUMIN 1.9*   Recent Labs  Lab 01/11/19 2135  LIPASE 29   No results for input(s): AMMONIA in the last 168 hours. Coagulation Profile: Recent Labs  Lab 01/11/19 2135  INR 1.2   Cardiac Enzymes: No results for input(s):  CKTOTAL, CKMB, CKMBINDEX, TROPONINI in the last 168 hours. BNP (last 3 results) No results for input(s): PROBNP in the last 8760 hours. HbA1C: No results for input(s): HGBA1C in the last 72 hours. CBG: No results for input(s): GLUCAP in the last 168 hours. Lipid Profile: No results for input(s): CHOL, HDL, LDLCALC, TRIG, CHOLHDL, LDLDIRECT in the last 72 hours. Thyroid Function Tests: No results for input(s): TSH, T4TOTAL, FREET4, T3FREE, THYROIDAB in the last 72 hours. Anemia Panel: Recent Labs    01/11/19 2135  VITAMINB12 220  FOLATE 6.8  FERRITIN 9*  TIBC 237*  IRON <5*  RETICCTPCT 3.6*   Urine analysis:    Component Value Date/Time   COLORURINE STRAW (A) 10/10/2018 1201   APPEARANCEUR CLEAR 10/10/2018 1201   LABSPEC 1.012 10/10/2018 1201   PHURINE 6.0 10/10/2018 1201   [place] 10/10/2018 1201   [place] 10/10/2018 1201   [place] 10/10/2018 [place] 10/10/2018 [place] 10/10/2018 1201   UROBILINOGEN 1 06/09/2014 1531   NITRITE NEGATIVE 10/10/2018 1201   LEUKOCYTESUR NEGATIVE 10/10/2018 1201   Sepsis Labs: @LABRCNTIP (procalcitonin:4,lacticidven:4)  ) Recent Results (from the past 240 hour(s))  SARS Coronavirus 2 [facility] order, Performed in [facility] hospital lab) Nasopharyngeal Nasopharyngeal Swab     Status: None   Collection Time: 01/11/19 10:09 PM   Specimen: Nasopharyngeal Swab  Result Value Ref Range Status   SARS Coronavirus 2 NEGATIVE NEGATIVE Final    Comment: (NOTE) If result is NEGATIVE SARS-CoV-2 target nucleic acids are NOT DETECTED. The SARS-CoV-2 RNA is generally detectable in upper and lower  respiratory specimens during the acute phase of infection. The lowest  concentration of SARS-CoV-2 viral copies this assay can detect is 250  copies / mL. A negative result does not preclude SARS-CoV-2 infection  and should not be used as the sole basis for treatment or other  patient  management decisions.  A negative result may occur with  improper specimen collection / handling, submission of specimen other  than nasopharyngeal swab, presence of viral mutation(s) within the  areas targeted by this assay, and inadequate number of viral copies  (<250 copies / mL). A negative result must be combined with clinical  observations, patient history, and epidemiological information. If result is POSITIVE SARS-CoV-2 target nucleic acids are DETECTED. The SARS-CoV-2 RNA is generally detectable in upper and lower  respiratory specimens dur ing the acute phase of infection.  Positive  results are indicative of active infection with SARS-CoV-2.  Clinical  correlation with patient history and other diagnostic information is  necessary to determine patient infection status.  Positive results do  not rule out bacterial infection or co-infection with other viruses. If result is PRESUMPTIVE POSTIVE SARS-CoV-2 nucleic acids MAY BE PRESENT.   A presumptive positive result was obtained on the submitted specimen  and confirmed on repeat testing.  While 2019 novel coronavirus  (SARS-CoV-2) nucleic acids may be present in the submitted sample  additional confirmatory testing may be necessary for epidemiological  and / or clinical management purposes  to differentiate between  SARS-CoV-2 and other Sarbecovirus currently known to infect humans.  If clinically indicated additional testing with an alternate test  methodology [phone]) is advised. The SARS-CoV-2 RNA is generally  detectable in upper and lower respiratory sp ecimens during the acute  phase of infection. The expected result is Negative. Fact Sheet for Patients:  StrictlyIdeas.no Fact Sheet for Healthcare Providers: BankingDealers.co.za This test is not yet approved or cleared by the [place] FDA and has been authorized for detection and/or diagnosis of SARS-CoV-2 by FDA under  an Emergency Use Authorization (EUA).  This EUA will remain in effect (meaning this test can be used) for the duration of the COVID-19 declaration under Section 564(b)(1) of the Act, 21 U.S.C. section 360bbb-3(b)(1), unless the authorization is terminated or revoked sooner. Performed at [facility], [place] [address], [place], [place] [address]          Radiology Studies: Ct Abdomen Pelvis W Contrast  Result Date: 01/11/2019 CLINICAL DATA:  Nausea, vomiting and diarrhea. EXAM: CT ABDOMEN AND PELVIS WITH CONTRAST TECHNIQUE: Multidetector CT imaging of the abdomen and pelvis was performed using the standard protocol following bolus administration of intravenous contrast. CONTRAST:  152mL OMNIPAQUE IOHEXOL 300 MG/ML  SOLN COMPARISON:  CT abdomen pelvis Oct 10, 2018 FINDINGS: Lower chest: Multifocal areas of patchy ground-glass opacity present throughout the included lung bases. Cardiac silhouette is enlarged. No pericardial effusion. Aortic leaflet calcifications and calcification of the coronary arteries is noted. Included portion of the descending thoracic aorta is heavily calcified as well. Hepatobiliary: No focal liver abnormality is seen. No gallstones, gallbladder wall thickening, or frank biliary dilatation. Inflammation in the region the gallbladder fossa is favored to be reactive/related to the adjacent duodenal inflammation. Pancreas: Unremarkable. No pancreatic ductal dilatation or surrounding inflammatory changes. Spleen: Normal in size without focal abnormality. Adrenals/Urinary Tract: Adrenal glands are unremarkable. Kidneys are normal, without renal calculi, focal lesion, or hydronephrosis. Bladder is unremarkable. Stomach/Bowel: There is redemonstration of circumferential wall thickening of the antrum of the stomach and throughout the first through third portions of the duodenal sweep, now with increasing periduodenal inflammatory changes and trace reactive fluid tracking in the  retroperitoneum and into the gallbladder fossa. No extraluminal gas. No organized collection or abscess. Distal small bowel has a more normal caliber. Normal appendix in the right lower quadrant. Moderate volume of stool through the colon. No colonic dilatation or wall thickening. Vascular/Lymphatic: Atherosclerotic plaque within the normal caliber aorta. Reactive adenopathy in the upper abdomen. Reproductive: Uterus is surgically absent. No concerning adnexal lesions. Other: Trace free fluid adjacent to the inflamed duodenum in the gastric antrum. No extraluminal gas or organized collection. No bowel containing hernias. Musculoskeletal: Chronic bilateral pars defects L4. Grade 1 anterolisthesis of L3 on L4, L4 on L5. Stable compression deformities of L3, L4 and L5. Background of diffuse discogenic and facet degenerative change most pronounced at these lower lumbar levels. No acute or suspicious osseous lesions. IMPRESSION: 1. Persistent circumferential wall thickening of the antrum of the stomach and throughout the first through third portions of the duodenal sweep, now with increasing periduodenal inflammatory changes and trace reactive fluid tracking in the retroperitoneum and into the gallbladder fossa. Findings are consistent with worsening duodenitis and possible. No evidence of frank perforation or abscess formation. 2. Multifocal areas of patchy ground-glass opacity throughout the included lung bases, concerning for multifocal pneumonia. 3. Unchanged appearance of the multilevel compression deformities, spondylolysis and spondylolisthesis in the lower lumbar spine. 4. Aortic Atherosclerosis (  ICD10-I70.0). Electronically Signed   By: Lovena Le M.D.   On: 01/11/2019 23:24        Scheduled Meds: . ARIPiprazole  5 mg Oral Daily  . cholecalciferol  1,000 Units Oral Daily  . donepezil  10 mg Oral QHS  . gabapentin  200 mg Oral BID  . lamoTRIgine  400 mg Oral Daily  . pantoprazole (PROTONIX) IV  40  mg Intravenous Q12H  . rosuvastatin  40 mg Oral Daily  . sertraline  200 mg Oral Daily  . sodium chloride flush  3 mL Intravenous Q12H  . thiamine  250 mg Oral BID   Continuous Infusions: . sodium chloride    . sodium chloride       LOS: 1 day    Time spent: 35 minutes    Dana Allan, MD  [facility] Pager #: [phone] 7PM-7AM contact night coverage as above

## 2019-01-12 NOTE — Op Note (Signed)
[facility] Patient Name: Angelica Kelley Procedure Date : 01/12/2019 MRN: [id number] Attending MD: Thornton Park MD, MD Date of Birth: 03-30-1946 CSN: [id number] Age: 73 Admit Type: Inpatient Procedure:                Upper GI endoscopy Indications:              Suspected upper gastrointestinal bleeding, Abnormal                            CT of the GI tract Providers:                Thornton Park MD, MD, Baird Cancer, RN, Ladona Ridgel, Technician Referring MD:              Medicines:                See the Anesthesia note for documentation of the                            administered medications Complications:            No immediate complications. Estimated blood loss:                            Minimal. Estimated Blood Loss:     Estimated blood loss was minimal. Procedure:                Pre-Anesthesia Assessment:                           - Prior to the procedure, a History and Physical                            was performed, and patient medications and                            allergies were reviewed. The patient's tolerance of                            previous anesthesia was also reviewed. The risks                            and benefits of the procedure and the sedation                            options and risks were discussed with the patient.                            All questions were answered, and informed consent                            was obtained. Prior Anticoagulants: The patient has                            taken  no previous anticoagulant or antiplatelet                            agents. ASA Grade Assessment: III - A patient with                            severe systemic disease. After reviewing the risks                            and benefits, the patient was deemed in                            satisfactory condition to undergo the procedure.                           After obtaining informed  consent, the endoscope was                            passed under direct vision. Throughout the                            procedure, the patient's blood pressure, pulse, and                            oxygen saturations were monitored continuously. The                            [id number]) Olympus gastroscope was                            introduced through the mouth, and advanced to the                            second part of duodenum. The upper GI endoscopy was                            accomplished without difficulty. The patient                            tolerated the procedure well. Scope In: Scope Out: Findings:      The esophagus was normal.      At least 15 non-bleeding cratered gastric ulcers with no stigmata of       bleeding were found in the gastric body and in the gastric antrum. The       largest lesion was 6 mm in largest dimension. Biopsies were taken from       the antrum, body, and fundus, with a cold forceps for histology.       Estimated blood loss was minimal. No blood present in the stomach.      The duodenal bulb is abnormal. There is a mass like area in the bulb       adjacent to one non-bleeding cratered duodenal ulcer with adherent clot       was found in the duodenal bulb. I did not biopsy this area due to the  high risk for bleeding with the adherent clot. I did not advance beyond       the ulcer due to the adherent clot and the high risk for bleeding.      The cardia and gastric fundus were normal on retroflexion. Impression:               - Normal esophagus.                           - Multiple non-bleeding gastric ulcers with no                            stigmata of bleeding. Biopsied.                           - Non-bleeding duodenal ulcer with adherent clot.                            This is at very high risk for rebleeding. Recommendation:           - Clear liquid diet today. No red liquids, please.                           -  Continue present medications. Continuous IV                            infusion of Protonix given the high risk for                            rebleeding.                           - Await pathology results.                           - Repeat upper endoscopy later this week after                            additional proton pump inhibitor therapy to further                            evaluate the duodenal and obtain duodenal bulb                            biopsies.                           - Continue serial hemoglobin/hematocrit with                            transfusion as indicated. Procedure Code(s):        --- Professional ---                           [id number], Esophagogastroduodenoscopy, flexible,  transoral; with biopsy, single or multiple Diagnosis Code(s):        --- Professional ---                           K25.9, Gastric ulcer, unspecified as acute or                            chronic, without hemorrhage or perforation                           K26.4, Chronic or unspecified duodenal ulcer with                            hemorrhage                           R93.3, Abnormal findings on diagnostic imaging of                            other parts of digestive tract CPT copyright 2019 [employer]. All rights reserved. The codes documented in this report are preliminary and upon coder review may  be revised to meet current compliance requirements. Thornton Park MD, MD 01/12/2019 2:13:17 PM This report has been signed electronically. Number of Addenda: 0

## 2019-01-12 NOTE — Anesthesia Preprocedure Evaluation (Addendum)
Anesthesia Evaluation  Patient identified by MRN, date of birth, ID band Patient awake    Reviewed: Allergy & Precautions, H&P , NPO status , Patient's Chart, lab work & pertinent test results  Airway Mallampati: I  TM Distance: >3 FB Neck ROM: Full    Dental no notable dental hx. (+) Teeth Intact, Dental Advisory Given   Pulmonary neg pulmonary ROS, former smoker,    Pulmonary exam normal breath sounds clear to auscultation       Cardiovascular hypertension, +CHF  + dysrhythmias Atrial Fibrillation  Rhythm:Regular Rate:Normal     Neuro/Psych Anxiety Depression Bipolar Disorder Dementia TIAnegative psych ROS   GI/Hepatic Neg liver ROS, GERD  ,  Endo/Other  negative endocrine ROS  Renal/GU Renal InsufficiencyRenal disease  negative genitourinary   Musculoskeletal   Abdominal   Peds  Hematology  (+) Blood dyscrasia, anemia ,   Anesthesia Other Findings   Reproductive/Obstetrics negative OB ROS                            Anesthesia Physical Anesthesia Plan  ASA: III  Anesthesia Plan: MAC   Post-op Pain Management:    Induction: Intravenous  PONV Risk Score and Plan: 2 and Propofol infusion and Treatment may vary due to age or medical condition  Airway Management Planned: Nasal Cannula  Additional Equipment:   Intra-op Plan:   Post-operative Plan:   Informed Consent: I have reviewed the patients History and Physical, chart, labs and discussed the procedure including the risks, benefits and alternatives for the proposed anesthesia with the patient or authorized representative who has indicated his/her understanding and acceptance.     Dental advisory given  Plan Discussed with: CRNA  Anesthesia Plan Comments:         Anesthesia Quick Evaluation
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
## 2019-01-12 NOTE — Plan of Care (Signed)
POC initiated and progressing. 

## 2019-01-12 NOTE — Progress Notes (Signed)
Pharmacy Antibiotic Note  Angelica Kelley is a 73 y.o. female admitted on 01/11/2019 with concern for PNA. Pharmacy has been consulted for Vancomycin dosing along with Rocephin per MD.  The patient is noted to have AKI but this is resolving, BL SCr <1, SCr down to 1.23 << 1.5, CrCl~30-40 ml/min.   Plan: - Vancomycin 1g IV x 1 followed by 750 mg IV every 24 hours (est AUC 469, SCr 1.23) - Continue Rocephin per MD - Will continue to follow renal function, culture results, LOT, and antibiotic de-escalation plans   Height: 5\' 6"  (167.6 cm) Weight: 139 lb 5.3 oz (63.2 kg) IBW/kg (Calculated) : 59.3  Temp (24hrs), Avg:98 F (36.7 C), Min:97.5 F (36.4 C), Max:98.7 F (37.1 C)  Recent Labs  Lab 01/11/19 2135 01/12/19 1123  WBC 12.2* 21.2*  CREATININE 1.50* 1.23*    Estimated Creatinine Clearance: 37 mL/min (A) (by C-G formula based on SCr of 1.23 mg/dL (H)).    Allergies  Allergen Reactions  . Lipitor [Atorvastatin]     Fatigue  . Prednisone Other (See Comments)    Change in mental status    Antimicrobials this admission: CTX 8/30 >> Vanc 8/31 >>  Dose adjustments this admission: n/a  Microbiology results: 8/30 COVID >> neg 8/31 RCx >> 8/31 MRSA PCR >>  Thank you for allowing pharmacy to be a part of this patient's care.  Alycia Rossetti, PharmD, BCPS Clinical Pharmacist Clinical phone for 01/12/2019: [phone] 01/12/2019 7:20 PM   **Pharmacist phone directory can now be found on [email or web address] ([email or web address]).  Listed under [place].

## 2019-01-12 NOTE — Anesthesia Postprocedure Evaluation (Signed)
Anesthesia Post Note  Patient: Angelica Kelley  Procedure(s) Performed: ESOPHAGOGASTRODUODENOSCOPY (EGD) (Left ) BIOPSY     Patient location during evaluation: Endoscopy Anesthesia Type: MAC Level of consciousness: awake and alert Pain management: pain level controlled Vital Signs Assessment: post-procedure vital signs reviewed and stable Respiratory status: spontaneous breathing, nonlabored ventilation and respiratory function stable Cardiovascular status: stable and blood pressure returned to baseline Postop Assessment: no apparent nausea or vomiting Anesthetic complications: no    Last Vitals:  Vitals:   01/12/19 1357 01/12/19 1359  BP: (!) 127/36 (!) 117/58  Pulse: 96 72  Resp: 11 14  Temp:    SpO2: 91% 100%    Last Pain:  Vitals:   01/12/19 1359  TempSrc:   PainSc: 0-No pain                 Saide Lanuza,W. EDMOND

## 2019-01-13 ENCOUNTER — Inpatient Hospital Stay (HOSPITAL_COMMUNITY): Payer: Medicare Other

## 2019-01-13 ENCOUNTER — Encounter (HOSPITAL_COMMUNITY): Payer: Self-pay | Admitting: General Practice

## 2019-01-13 DIAGNOSIS — K257 Chronic gastric ulcer without hemorrhage or perforation: Secondary | ICD-10-CM

## 2019-01-13 DIAGNOSIS — K269 Duodenal ulcer, unspecified as acute or chronic, without hemorrhage or perforation: Secondary | ICD-10-CM

## 2019-01-13 LAB — URINALYSIS, ROUTINE W REFLEX MICROSCOPIC
Bilirubin Urine: NEGATIVE
Glucose, UA: NEGATIVE mg/dL
Ketones, ur: NEGATIVE mg/dL
Leukocytes,Ua: NEGATIVE
Nitrite: NEGATIVE
Protein, ur: NEGATIVE mg/dL
Specific Gravity, Urine: 1.016 (ref 1.005–1.030)
pH: 5 (ref 5.0–8.0)

## 2019-01-13 LAB — CBC
HCT: 21 % — ABNORMAL LOW (ref 36.0–46.0)
Hemoglobin: 7.1 g/dL — ABNORMAL LOW (ref 12.0–15.0)
MCH: 28.7 pg (ref 26.0–34.0)
MCHC: 33.8 g/dL (ref 30.0–36.0)
MCV: 85 fL (ref 80.0–100.0)
Platelets: 236 10*3/uL (ref 150–400)
RBC: 2.47 MIL/uL — ABNORMAL LOW (ref 3.87–5.11)
RDW: 15.8 % — ABNORMAL HIGH (ref 11.5–15.5)
WBC: 14 10*3/uL — ABNORMAL HIGH (ref 4.0–10.5)
nRBC: 0 % (ref 0.0–0.2)

## 2019-01-13 LAB — HEMOGLOBIN AND HEMATOCRIT, BLOOD
HCT: 22.1 % — ABNORMAL LOW (ref 36.0–46.0)
Hemoglobin: 7.2 g/dL — ABNORMAL LOW (ref 12.0–15.0)

## 2019-01-13 LAB — GLUCOSE, CAPILLARY: Glucose-Capillary: 103 mg/dL — ABNORMAL HIGH (ref 70–99)

## 2019-01-13 LAB — MRSA PCR SCREENING: MRSA by PCR: NEGATIVE

## 2019-01-13 NOTE — TOC Initial Note (Signed)
Transition of Care [facility]) - Initial/Assessment Note    Patient Details  Name: Angelica Kelley MRN: [id number] Date of Birth: 1945/08/04  [facility] CM/SW Contact:    Vinie Sill, [place] Phone Number: 01/13/2019, 11:45 AM  Clinical Narrative:                  CSW spoke with patient's niece, Libbyn and patient's POA. She reports the patient is from [phone] and plans on her returning once she is medically stable.    Thurmond Butts, MSW, [place] Clinical Social Worker [phone]     Barriers to Discharge: Continued Medical Work up   Patient Goals and CMS Choice        Expected Discharge Plan and Services                                                Prior Living Arrangements/Services   Lives with:: Facility Resident Patient language and need for interpreter reviewed:: Yes        Need for Family Participation in Patient Care: Yes (Comment) Care giver support system in place?: Yes (comment)   Criminal Activity/Legal Involvement Pertinent to Current Situation/Hospitalization: No - Comment as needed  Activities of Daily Living Home Assistive Devices/Equipment: None ADL Screening (condition at time of admission) Patient's cognitive ability adequate to safely complete daily activities?: Yes Is the patient deaf or have difficulty hearing?: No Does the patient have difficulty seeing, even when wearing glasses/contacts?: No Does the patient have difficulty concentrating, remembering, or making decisions?: No Patient able to express need for assistance with ADLs?: Yes Does the patient have difficulty dressing or bathing?: No Independently performs ADLs?: Yes (appropriate for developmental age) Does the patient have difficulty walking or climbing stairs?: Yes Weakness of Legs: Both Weakness of Arms/Hands: None  Permission Sought/Granted Permission sought to share information with : Family Supports Permission  granted to share information with : Yes, Verbal Permission Granted  Share Information with NAME: Jilda Panda (POA)  Permission granted to share info w AGENCY: [facility]  Permission granted to share info w Relationship: Niece(POA)  Permission granted to share info w Contact Information: [phone]  Emotional Assessment           Psych Involvement: No (comment)  Admission diagnosis:  Melena [K92.1] Patient Active Problem List   Diagnosis Date Noted  . Acute blood loss anemia 01/11/2019  . Syncope 10/12/2017  . Benzodiazepine overdose 09/07/2017  . Dementia ([place]) 09/07/2017  . Chronic diastolic heart failure ([place]) 09/07/2017  . Chronic anemia 09/07/2017  . Hypokalemia 08/19/2017  . Depression with anxiety 05/09/2017  . Fall 05/09/2017  . Slurred speech 05/09/2017  . CKD (chronic kidney disease) stage 2, GFR 60-89 ml/min 04/23/2017  . OAB (overactive bladder) 06/09/2014  . Vitamin D deficiency 10/27/2013  . Medication management 10/27/2013  . Hypertension   . Hyperlipidemia, mixed   . GERD (gastroesophageal reflux disease)   . Bipolar depression ([place])   . Alcoholism ([place])   . IBS (irritable bowel syndrome)   . Other abnormal glucose    PCP:  No primary care provider on file. Pharmacy:   [facility] RO:[id number] [place], [place] [address] [place] [address] Phone: [phone] Fax: [phone]     Social Determinants of Health (  SDOH) Interventions    Readmission Risk Interventions No flowsheet data found.

## 2019-01-13 NOTE — Progress Notes (Signed)
PROGRESS NOTE  Angelica Kelley [id number] DOB: 01/01/1946 DOA: 01/11/2019 PCP: No primary care provider on file.  Brief History   Patient is a 73 year old Caucasian female with past medical history significant for dementia, TIA, hypertension, hyperlipidemia, diastolic congestive heart failure, atrial fibrillation, alcoholism and iron deficiency anemia.  Patient was admitted following a fall and reported hematemesis and melena stools.  On presentation to the hospital, hemoglobin was documented as 3.5 g/dL.  Last hemoglobin was 10 g/dL.  Other pertinent labs include serum creatinine had gone up from 0.75-1.5, albumin of 1.8, iron of less than 5, ferritin of 9, folate of 6.8 with INR of 1.2.  EGD done earlier today revealed: "Multiple non-bleeding gastric ulcers with no stigmata of bleeding.  Non-bleeding duodenal ulcer with adherent clot. This is at very high risk for re-bleeding". The patient is currently completing 72 hours of IV Protonix. GI intends to follow up with repeat EGD to reasses and perform biopsies.   Consultants  . Gastroenterology  Procedures  . EGD  Antibiotics   Anti-infectives (From admission, onward)   Start     Dose/Rate Route Frequency Ordered Stop   01/13/19 2000  vancomycin (VANCOCIN) IVPB 750 mg/150 ml premix     750 mg 150 mL/hr over 60 Minutes Intravenous Every 24 hours 01/12/19 1925     01/12/19 1930  vancomycin (VANCOCIN) IVPB 1000 mg/200 mL premix     1,000 mg 200 mL/hr over 60 Minutes Intravenous  Once 01/12/19 1839 01/12/19 2317   01/12/19 1830  cefTRIAXone (ROCEPHIN) 1 g in sodium chloride 0.9 % 100 mL IVPB     1 g 200 mL/hr over 30 Minutes Intravenous Every 24 hours 01/12/19 1816     01/11/19 2200  cefTRIAXone (ROCEPHIN) 1 g in sodium chloride 0.9 % 100 mL IVPB     1 g 200 mL/hr over 30 Minutes Intravenous  Once 01/11/19 2155 01/11/19 2254    .  Subjective  The patient is resting comfortably. No new complaints.  Objective   Vitals:  Vitals:    01/13/19 1120 01/13/19 1628  BP: 105/60 (!) 117/50  Pulse: 68 71  Resp: 20 (!) 21  Temp: 98.1 F (36.7 C) 98 F (36.7 C)  SpO2: 93% 100%    Exam:  Constitutional:  . Appears calm and comfortable Respiratory:  . No increased work of breathing. . No wheezes, rales, or rhonchi. . No tactile fremitus. Cardiovascular:  . Regular rate and rhythm. . No murmurs, ectopy, or gallups. . No lateral PMI. No thrill. Abdomen:  . Abdomen is soft, non-tender, non-distended. . No hernias, masses, or organomegaly. . Normoactive bowel sounds.  Musculoskeletal:  . No cyanosis, clubbing, or edema. Skin:  . No rashes, lesions, ulcers . palpation of skin: no induration or nodules Neurologic:  . CN 2-12 intact . Sensation all 4 extremities intact Psychiatric:  . Mental status o Mood, affect appropriate o Orientation to person, place, time  . judgment and insight appear intact     I have personally reviewed the following:   Today's Data  . Vitals, CBC, hemoglobin/hematorcrit, procalcitonin  Imaging  . CXR - multifocal pulmonary opacities.   Scheduled Meds: . ARIPiprazole  5 mg Oral Daily  . cholecalciferol  1,000 Units Oral Daily  . donepezil  10 mg Oral QHS  . gabapentin  200 mg Oral BID  . lamoTRIgine  400 mg Oral Daily  . rosuvastatin  40 mg Oral Daily  . sertraline  200 mg Oral Daily  . sodium  chloride flush  3 mL Intravenous Q12H  . thiamine  250 mg Oral BID   Continuous Infusions: . cefTRIAXone (ROCEPHIN)  IV 1 g (01/13/19 1822)  . pantoprozole (PROTONIX) infusion 8 mg/hr (01/13/19 1531)  . vancomycin      Active Problems:   Acute blood loss anemia   LOS: 2 days   A & P   Acute blood loss anemia 2/2 upper GI bleed (see EGD report above): Continue Protonix drip x 72 hours. Repeat EGD as per GI. Monitor H&H closely. Transfuse as necessary. Hold ASA and VTE ppx. I appreciate GI's assistance.  Gastric ulcer and duodenal ulcer:Currently see above.  Bibasilar  groundglass opacities: Atypical pneumonia. Continue Rocephin and Vancomycin. Monitor. Procalcitonin is equivocal at 0.25.  Bipolar disorder: continue home Zoloft, Lamictaland Aricept daily, Klonopin once daily as needed  Overactive bladder: continue home Myrbetriq  HLD: continue home statin  Afib: not on rate control or anticoagulation, continue to monitor  I have seen and examined this patient myself. I have spent 35 minutes in her evaluation and care.  DVT prophylaxis: SCD Code Status: Full code Family Communication:  Disposition Plan: Depend on hospital course  Delshon Blanchfield, DO [facility] Direct contact: see [email or web address]  7PM-7AM contact night coverage as above 01/13/2019, 7:15 PM  LOS: 2 days

## 2019-01-13 NOTE — Progress Notes (Signed)
Lab notified that sputum specimen was unacceptable, mostly saliva. Need to recollect, due to Pt has dry, non productive cough. Will try again later when Pt is available.  Kennyth Lose, RN

## 2019-01-13 NOTE — Progress Notes (Addendum)
Daily Rounding Note  01/13/2019, 8:26 AM  LOS: 2 days   SUBJECTIVE:   Chief complaint: CGE.  Dark stools.    Patient feels pretty well.  Having a little bit of stomach burning and feels bloated.  No significant pain.  No nausea.  Stool was dark later in the day/evening yesterday  OBJECTIVE:         Vital signs in last 24 hours:    Temp:  [97.9 F (36.6 C)-98.7 F (37.1 C)] 98 F (36.7 C) (09/01 0741) Pulse Rate:  [67-96] 67 (09/01 0741) Resp:  [11-19] 18 (09/01 0741) BP: (94-218)/(36-132) 108/49 (09/01 0741) SpO2:  [90 %-100 %] 98 % (09/01 0741) Weight:  [63.2 kg-65.6 kg] 65.6 kg (09/01 0407) Last BM Date: 01/12/19 Filed Weights   01/12/19 0142 01/12/19 1245 01/13/19 0407  Weight: 63.2 kg 63.2 kg 65.6 kg   General: Pleasantly forgetful.  Alert.  Besides looking pale, does not look ill. Heart: RRR.  Harsh murmur. Chest: Clear bilaterally, excellent breath sounds.  No cough or dyspnea. Abdomen: Soft.  Not tender or distended.  Active bowel sounds. Extremities: No CCE. Neuro/Psych: Pleasantly confused but knows who she is and that she is at the hospital.  Intake/Output from previous day: 08/31 0701 - 09/01 0700 In: 1904.9 [P.O.:550; I.V.:325.6; Blood:729.3; IV Piggyback:300] Out: 350 [Urine:350]  Intake/Output this shift: No intake/output data recorded.  Lab Results: Recent Labs    01/11/19 2135 01/12/19 1123 01/12/19 1826 01/13/19 0238  WBC 12.2* 21.2*  --  14.0*  HGB 3.8* 9.2* 9.1* 7.1*  HCT 13.4* 27.5* 26.8* 21.0*  PLT 347 270  --  236   BMET Recent Labs    01/11/19 2135 01/12/19 1123  NA 140 140  K 4.0 4.8  CL 109 110  CO2 19* 20*  GLUCOSE 178* 109*  BUN 25* 37*  CREATININE 1.50* 1.23*  CALCIUM 7.2* 6.9*   LFT Recent Labs    01/11/19 2135  PROT 4.2*  ALBUMIN 1.9*  AST 11*  ALT 8  ALKPHOS 51  BILITOT 0.4   PT/INR Recent Labs    01/11/19 2135  LABPROT 15.2  INR 1.2    Hepatitis Panel No results for input(s): HEPBSAG, HCVAB, HEPAIGM, HEPBIGM in the last 72 hours.  Studies/Results: Ct Abdomen Pelvis W Contrast  Result Date: 01/11/2019 CLINICAL DATA:  Nausea, vomiting and diarrhea. EXAM: CT ABDOMEN AND PELVIS WITH CONTRAST TECHNIQUE: Multidetector CT imaging of the abdomen and pelvis was performed using the standard protocol following bolus administration of intravenous contrast. CONTRAST:  121mL OMNIPAQUE IOHEXOL 300 MG/ML  SOLN COMPARISON:  CT abdomen pelvis Oct 10, 2018 FINDINGS: Lower chest: Multifocal areas of patchy ground-glass opacity present throughout the included lung bases. Cardiac silhouette is enlarged. No pericardial effusion. Aortic leaflet calcifications and calcification of the coronary arteries is noted. Included portion of the descending thoracic aorta is heavily calcified as well. Hepatobiliary: No focal liver abnormality is seen. No gallstones, gallbladder wall thickening, or frank biliary dilatation. Inflammation in the region the gallbladder fossa is favored to be reactive/related to the adjacent duodenal inflammation. Pancreas: Unremarkable. No pancreatic ductal dilatation or surrounding inflammatory changes. Spleen: Normal in size without focal abnormality. Adrenals/Urinary Tract: Adrenal glands are unremarkable. Kidneys are normal, without renal calculi, focal lesion, or hydronephrosis. Bladder is unremarkable. Stomach/Bowel: There is redemonstration of circumferential wall thickening of the antrum of the stomach and throughout the first through third portions of the duodenal sweep, now with increasing periduodenal  inflammatory changes and trace reactive fluid tracking in the retroperitoneum and into the gallbladder fossa. No extraluminal gas. No organized collection or abscess. Distal small bowel has a more normal caliber. Normal appendix in the right lower quadrant. Moderate volume of stool through the colon. No colonic dilatation or wall  thickening. Vascular/Lymphatic: Atherosclerotic plaque within the normal caliber aorta. Reactive adenopathy in the upper abdomen. Reproductive: Uterus is surgically absent. No concerning adnexal lesions. Other: Trace free fluid adjacent to the inflamed duodenum in the gastric antrum. No extraluminal gas or organized collection. No bowel containing hernias. Musculoskeletal: Chronic bilateral pars defects L4. Grade 1 anterolisthesis of L3 on L4, L4 on L5. Stable compression deformities of L3, L4 and L5. Background of diffuse discogenic and facet degenerative change most pronounced at these lower lumbar levels. No acute or suspicious osseous lesions. IMPRESSION: 1. Persistent circumferential wall thickening of the antrum of the stomach and throughout the first through third portions of the duodenal sweep, now with increasing periduodenal inflammatory changes and trace reactive fluid tracking in the retroperitoneum and into the gallbladder fossa. Findings are consistent with worsening duodenitis and possible. No evidence of frank perforation or abscess formation. 2. Multifocal areas of patchy ground-glass opacity throughout the included lung bases, concerning for multifocal pneumonia. 3. Unchanged appearance of the multilevel compression deformities, spondylolysis and spondylolisthesis in the lower lumbar spine. 4. Aortic Atherosclerosis (ICD10-I70.0). Electronically Signed   By: Lovena Le M.D.   On: 01/11/2019 23:24   Scheduled Meds: . ARIPiprazole  5 mg Oral Daily  . cholecalciferol  1,000 Units Oral Daily  . donepezil  10 mg Oral QHS  . gabapentin  200 mg Oral BID  . lamoTRIgine  400 mg Oral Daily  . rosuvastatin  40 mg Oral Daily  . sertraline  200 mg Oral Daily  . sodium chloride flush  3 mL Intravenous Q12H  . thiamine  250 mg Oral BID   Continuous Infusions: . cefTRIAXone (ROCEPHIN)  IV 1 g (01/13/19 0002)  . pantoprozole (PROTONIX) infusion 8 mg/hr (01/13/19 0406)  . vancomycin     PRN  Meds:.acetaminophen **OR** acetaminophen, clonazePAM, ondansetron **OR** ondansetron (ZOFRAN) IV   ASSESMENT:   *    CGE.  Dark stools.  Taking Mobic, 325 ASA PTA 01/12/19 EGD: multiple, non-bleeding GUs. Bx'd.  Solitary DU with adherent clot is high risk to re bleed. These findings explain CT findings of gastric, prox duodenal thickening  Not yet 24 hours into 72 h Protonix drip but day 3 IV Protonix. Stool H Pylori Ag study ordered, not yet collected.       *    CT concerning for multifocal PNA.  On Rocephin, Vanc. Non-productive cough.   WBCs 21.2 >> 14.     *    Blood loss anemia.  Chronic iron for hx IDA.  Hgb 3.8 >> 4 PRBCs >> 9.1 >> 7.1.  The change from 3.8 to 7.1 is an appropriate response to 4 units.     PLAN   *  H & H q 8 hours.    *   Collect stool for H Pylori.  Await pahology from EGD biopsies.    *   Advance to full liquids.       Angelica Kelley  01/13/2019, 8:26 AM Phone [phone]

## 2019-01-14 ENCOUNTER — Encounter (HOSPITAL_COMMUNITY)
Admission: EM | Disposition: A | Discharge status: Home / Self Care | Payer: Self-pay | Source: Home / Self Care | Attending: Internal Medicine

## 2019-01-14 ENCOUNTER — Inpatient Hospital Stay (HOSPITAL_COMMUNITY): Payer: Medicare Other | Admitting: Certified Registered Nurse Anesthetist

## 2019-01-14 ENCOUNTER — Encounter (HOSPITAL_COMMUNITY): Payer: Self-pay | Admitting: Certified Registered Nurse Anesthetist

## 2019-01-14 HISTORY — PX: ESOPHAGOGASTRODUODENOSCOPY (EGD) WITH PROPOFOL: SHX5813

## 2019-01-14 LAB — BASIC METABOLIC PANEL
Anion gap: 6 (ref 5–15)
BUN: 31 mg/dL — ABNORMAL HIGH (ref 8–23)
CO2: 23 mmol/L (ref 22–32)
Calcium: 7.3 mg/dL — ABNORMAL LOW (ref 8.9–10.3)
Chloride: 113 mmol/L — ABNORMAL HIGH (ref 98–111)
Creatinine, Ser: 0.93 mg/dL (ref 0.44–1.00)
GFR calc Af Amer: >60 mL/min (ref >60)
GFR calc non Af Amer: >60 mL/min (ref >60)
Glucose, Bld: 91 mg/dL (ref 70–99)
Potassium: 3.6 mmol/L (ref 3.5–5.1)
Sodium: 142 mmol/L (ref 135–145)

## 2019-01-14 LAB — CBC WITH DIFFERENTIAL/PLATELET
Abs Immature Granulocytes: 0.2 10*3/uL — ABNORMAL HIGH (ref 0.00–0.07)
Basophils Absolute: 0 10*3/uL (ref 0.0–0.1)
Basophils Relative: 0 %
Eosinophils Absolute: 0.4 10*3/uL (ref 0.0–0.5)
Eosinophils Relative: 4 %
HCT: 19.2 % — ABNORMAL LOW (ref 36.0–46.0)
Hemoglobin: 6.2 g/dL — CL (ref 12.0–15.0)
Immature Granulocytes: 2 %
Lymphocytes Relative: 13 %
Lymphs Abs: 1.4 10*3/uL (ref 0.7–4.0)
MCH: 28.6 pg (ref 26.0–34.0)
MCHC: 32.3 g/dL (ref 30.0–36.0)
MCV: 88.5 fL (ref 80.0–100.0)
Monocytes Absolute: 0.6 10*3/uL (ref 0.1–1.0)
Monocytes Relative: 6 %
Neutro Abs: 7.9 10*3/uL — ABNORMAL HIGH (ref 1.7–7.7)
Neutrophils Relative %: 75 %
Platelets: 240 10*3/uL (ref 150–400)
RBC: 2.17 MIL/uL — ABNORMAL LOW (ref 3.87–5.11)
RDW: 16.8 % — ABNORMAL HIGH (ref 11.5–15.5)
WBC: 10.6 10*3/uL — ABNORMAL HIGH (ref 4.0–10.5)
nRBC: 0.2 % (ref 0.0–0.2)

## 2019-01-14 LAB — EXPECTORATED SPUTUM ASSESSMENT W GRAM STAIN, RFLX TO RESP C

## 2019-01-14 LAB — HEMOGLOBIN AND HEMATOCRIT, BLOOD
HCT: 22.6 % — ABNORMAL LOW (ref 36.0–46.0)
Hemoglobin: 7.4 g/dL — ABNORMAL LOW (ref 12.0–15.0)

## 2019-01-14 LAB — GLUCOSE, CAPILLARY: Glucose-Capillary: 101 mg/dL — ABNORMAL HIGH (ref 70–99)

## 2019-01-14 LAB — PREPARE RBC (CROSSMATCH)

## 2019-01-14 SURGERY — ESOPHAGOGASTRODUODENOSCOPY (EGD) WITH PROPOFOL
Anesthesia: Monitor Anesthesia Care

## 2019-01-14 MED ORDER — SODIUM CHLORIDE 0.9 % IV SOLN
510.0000 mg | Freq: Once | INTRAVENOUS | Status: AC
  Administered 2019-01-15: 11:00:00 510 mg via INTRAVENOUS
  Filled 2019-01-14 (×2): qty 17

## 2019-01-14 MED ORDER — PROPOFOL 500 MG/50ML IV EMUL
INTRAVENOUS | Status: DC | PRN
  Administered 2019-01-14: 100 ug/kg/min via INTRAVENOUS

## 2019-01-14 MED ORDER — SODIUM CHLORIDE 0.9% IV SOLUTION: Freq: Once | INTRAVENOUS | Status: DC

## 2019-01-14 MED ORDER — SODIUM CHLORIDE 0.9 % IV SOLN
510.0000 mg | Freq: Once | INTRAVENOUS | Status: DC
  Filled 2019-01-14: qty 17

## 2019-01-14 MED ORDER — LIDOCAINE HCL (CARDIAC) PF 100 MG/5ML IV SOSY
PREFILLED_SYRINGE | INTRAVENOUS | Status: DC | PRN
  Administered 2019-01-14: 100 mg via INTRAVENOUS

## 2019-01-14 MED ORDER — LACTATED RINGERS IV SOLN
INTRAVENOUS | Status: AC | PRN
  Administered 2019-01-14: 1000 mL via INTRAVENOUS

## 2019-01-14 MED ORDER — PROPOFOL 10 MG/ML IV BOLUS
INTRAVENOUS | Status: DC | PRN
  Administered 2019-01-14: 30 mg via INTRAVENOUS

## 2019-01-14 SURGICAL SUPPLY — 14 items

## 2019-01-14 NOTE — Transfer of Care (Signed)
Immediate Anesthesia Transfer of Care Note  Patient: Angelica Kelley  Procedure(s) Performed: ESOPHAGOGASTRODUODENOSCOPY (EGD) WITH PROPOFOL (N/A )  Patient Location: PACU and Endoscopy Unit  Anesthesia Type:MAC  Level of Consciousness: awake, alert  and oriented  Airway & Oxygen Therapy: Patient Spontanous Breathing and Patient connected to nasal cannula oxygen  Post-op Assessment: Report given to RN, Post -op Vital signs reviewed and stable and Patient moving all extremities  Post vital signs: Reviewed and stable  Last Vitals:  Vitals Value Taken Time  BP    Temp 37.2 C 01/14/19 1606  Pulse 67 01/14/19 1610  Resp 13 01/14/19 1610  SpO2 100 % 01/14/19 1610  Vitals shown include unvalidated device data.  Last Pain:  Vitals:   01/14/19 1606  TempSrc: Temporal  PainSc: 1       Patients Stated Pain Goal: 2 (24/40/10 2725)  Complications: No apparent anesthesia complications

## 2019-01-14 NOTE — Op Note (Addendum)
[facility] Patient Name: Angelica Kelley Procedure Date : 01/14/2019 MRN: [id number] Attending MD: Thornton Park MD, MD Date of Birth: 09/27/1945 CSN: [id number] Age: 73 Admit Type: Inpatient Procedure:                Upper GI endoscopy Indications:              Suspected upper gastrointestinal bleeding                           Known gastric ulcers and large DU with adherent                            clot on endoscopy 01/12/19                           Now with ongoing hematochezia and progressive                            anemia requiring PRBCs Providers:                Thornton Park MD, MD, Carlyn Reichert, RN, Lina Sar, Technician, Ladona Ridgel, Technician,                            Raphael Gibney, CRNA Referring MD:              Medicines:                See the Anesthesia note for documentation of the                            administered medications Complications:            No immediate complications. Estimated blood loss:                            Minimal. Estimated Blood Loss:     Estimated blood loss was minimal. Procedure:                Pre-Anesthesia Assessment:                           - Prior to the procedure, a History and Physical                            was performed, and patient medications and                            allergies were reviewed. The patient's tolerance of                            previous anesthesia was also reviewed. The risks                            and benefits of the procedure and the sedation  options and risks were discussed with the patient.                            All questions were answered, and informed consent                            was obtained. Prior Anticoagulants: The patient has                            taken no previous anticoagulant or antiplatelet                            agents. ASA Grade Assessment: III - A patient with                    severe systemic disease. After reviewing the risks                            and benefits, the patient was deemed in                            satisfactory condition to undergo the procedure.                           After obtaining informed consent, the endoscope was                            passed under direct vision. Throughout the                            procedure, the patient's blood pressure, pulse, and                            oxygen saturations were monitored continuously. The                            [id number] Olympus gastroscope was                            introduced through the mouth, and advanced to the                            third part of duodenum. The upper GI endoscopy was                            accomplished without difficulty. The patient                            tolerated the procedure well. The upper GI                            endoscopy was accomplished without difficulty. Scope In: Scope Out: Findings:      The esophagus was normal.      At least 15 non-bleeding cratered gastric ulcers with no  stigmata of       bleeding were found in the gastric body and in the gastric antrum. The       largest was 67mm. Biopsies were not obtained as biopsies earlier this       week were negative for H pylori or intestinal metaplasia.      One large non-obstructing non-bleeding cratered duodenal ulcer with       pigmented material consumed over half the circumference of the duodenal       bulb. The margin at the duodenal sweep appears slightly heaped and was       friable with the passage of the gastroscope. It is worrisome for       possible malignancy. I did not obtain biopsies as her risk for bleeding       is very high. I was able to advance beyond the bulb during the       examination today the the mucosa distal to the bulb appears normal. No       blood was present. No active bleeding seen.      A small hiatal hernia is  present. The cardia and gastric fundus were       normal on retroflexion. Impression:               - Normal esophagus.                           - Non-bleeding gastric ulcers with no stigmata of                            bleeding.                           - Non-obstructing non-bleeding duodenal ulcer with                            pigmented material. There is no evidence of                            perforation.                           - No specimens collected. Recommendation:           - Return patient to hospital ward for ongoing care.                           - Clear liquid diet today. No red liquids. Cautious                            diet advance only when hemoglobin stabilizes.                           - Continue present medications including to                            complete 72 hours of continuous infusion Protonix                            given the ongoing  high risk for rebleeding. Then                            convert to Protonix 40 mg IV BID.                           - Repeat upper endoscopy in 4-6 weeks to check                            healing and obtain duodenal biopsies.                           - Would consider IR for possible embolization if                            significant, overt GI blood loss with associated                            hemodynamic instability occurs. Given the size of                            the ulcer, endoscopic treatments for active                            bleeding are limited at this time.                           I reviewed these results and recommendations with                            the patient's brother, Angelica Kelley, by phone. Procedure Code(s):        --- Professional ---                           [phone], Esophagogastroduodenoscopy, flexible,                            transoral; diagnostic, including collection of                            specimen(s) by brushing or washing, when performed                             (separate procedure) Diagnosis Code(s):        --- Professional ---                           K25.9, Gastric ulcer, unspecified as acute or                            chronic, without hemorrhage or perforation                           K26.9, Duodenal ulcer, unspecified as acute or  chronic, without hemorrhage or perforation CPT copyright 2019 [employer]. All rights reserved. The codes documented in this report are preliminary and upon coder review may  be revised to meet current compliance requirements. Thornton Park MD, MD 01/14/2019 4:17:45 PM This report has been signed electronically. Number of Addenda: 0

## 2019-01-14 NOTE — Care Management Important Message (Signed)
Important Message  Patient Details  Name: ADYSYN DELACUEVA MRN: [id number] Date of Birth: 18-May-1945   Medicare Important Message Given:  Yes     Shelda Altes 01/14/2019, 12:33 PM

## 2019-01-14 NOTE — Progress Notes (Signed)
PROGRESS NOTE  Angelica Kelley [id number] DOB: 1946-02-05 DOA: 01/11/2019 PCP: No primary care provider on file.  Brief History   Patient is a 73 year old Caucasian female with past medical history significant for dementia, TIA, hypertension, hyperlipidemia, diastolic congestive heart failure, atrial fibrillation, alcoholism and iron deficiency anemia.  Patient was admitted following a fall and reported hematemesis and melena stools.  On presentation to the hospital, hemoglobin was documented as 3.5 g/dL.  Last hemoglobin was 10 g/dL.  Other pertinent labs include serum creatinine had gone up from 0.75-1.5, albumin of 1.8, iron of less than 5, ferritin of 9, folate of 6.8 with INR of 1.2.  EGD done earlier today revealed: "Multiple non-bleeding gastric ulcers with no stigmata of bleeding.  Non-bleeding duodenal ulcer with adherent clot. This is at very high risk for re-bleeding". The patient is currently completing 72 hours of IV Protonix. GI will follow up with repeat EGD to reasses and perform biopsies.   Consultants  . Gastroenterology  Procedures  . EGD  Antibiotics   Anti-infectives (From admission, onward)   Start     Dose/Rate Route Frequency Ordered Stop   01/13/19 2000  vancomycin (VANCOCIN) IVPB 750 mg/150 ml premix     750 mg 150 mL/hr over 60 Minutes Intravenous Every 24 hours 01/12/19 1925     01/12/19 1930  vancomycin (VANCOCIN) IVPB 1000 mg/200 mL premix     1,000 mg 200 mL/hr over 60 Minutes Intravenous  Once 01/12/19 1839 01/12/19 2317   01/12/19 1830  cefTRIAXone (ROCEPHIN) 1 g in sodium chloride 0.9 % 100 mL IVPB     1 g 200 mL/hr over 30 Minutes Intravenous Every 24 hours 01/12/19 1816     01/11/19 2200  cefTRIAXone (ROCEPHIN) 1 g in sodium chloride 0.9 % 100 mL IVPB     1 g 200 mL/hr over 30 Minutes Intravenous  Once 01/11/19 2155 01/11/19 2254     Subjective  The patient is resting comfortably. No new complaints.  Objective   Vitals:  Vitals:   01/14/19  1624 01/14/19 1646  BP: (!) 146/59 (!) 160/73  Pulse: 66 72  Resp: 19 18  Temp: 98.1 F (36.7 C) 98.4 F (36.9 C)  SpO2: 100% 100%    Exam:  Constitutional:  . Appears calm and comfortable Respiratory:  . No increased work of breathing. . No wheezes, rales, or rhonchi. . No tactile fremitus. Cardiovascular:  . Regular rate and rhythm. . No murmurs, ectopy, or gallups. . No lateral PMI. No thrill. Abdomen:  . Abdomen is soft, non-tender, non-distended. . No hernias, masses, or organomegaly. . Normoactive bowel sounds.  Musculoskeletal:  . No cyanosis, clubbing, or edema. Skin:  . No rashes, lesions, ulcers . palpation of skin: no induration or nodules Neurologic:  . CN 2-12 intact . Sensation all 4 extremities intact Psychiatric:  . Mental status o Mood, affect appropriate o Orientation to person, place, time  . judgment and insight appear intact     I have personally reviewed the following:   Today's Data  . Vitals, CBC, hemoglobin/hematorcrit, procalcitonin  Imaging  . CXR - multifocal pulmonary opacities.   Scheduled Meds: . sodium chloride   Intravenous Once  . ARIPiprazole  5 mg Oral Daily  . cholecalciferol  1,000 Units Oral Daily  . donepezil  10 mg Oral QHS  . gabapentin  200 mg Oral BID  . lamoTRIgine  400 mg Oral Daily  . rosuvastatin  40 mg Oral Daily  . sertraline  200  mg Oral Daily  . sodium chloride flush  3 mL Intravenous Q12H  . thiamine  250 mg Oral BID   Continuous Infusions: . cefTRIAXone (ROCEPHIN)  IV 1 g (01/14/19 1911)  . [START ON 01/15/2019] ferumoxytol    . pantoprozole (PROTONIX) infusion 8 mg/hr (01/14/19 1400)  . vancomycin 750 mg (01/13/19 1946)    Active Problems:   Acute blood loss anemia   LOS: 3 days   A & P   Acute blood loss anemia 2/2 upper GI bleed (see EGD report above): Continue Protonix drip x 72 hours. Repeat EGD as per GI. Monitor H&H closely. Transfuse as necessary. Hold ASA and VTE ppx. I appreciate  GI's assistance.  Gastric ulcer and duodenal ulcer:Currently see above.  Bibasilar groundglass opacities: Atypical pneumonia. Continue Rocephin and Vancomycin. Monitor. Procalcitonin is equivocal at 0.25.  Bipolar disorder: continue home Zoloft, Lamictaland Aricept daily, Klonopin once daily as needed  Overactive bladder: continue home Myrbetriq  HLD: continue home statin  Afib: not on rate control or anticoagulation, continue to monitor  I have seen and examined this patient myself. I have spent 30 minutes in her evaluation and care.  DVT prophylaxis: SCD Code Status: Full code Family Communication:  Disposition Plan: Depend on hospital course  Michaeljames Milnes, DO [facility] Direct contact: see [email or web address]  7PM-7AM contact night coverage as above 01/14/2019, 7:25 PM  LOS: 2 days

## 2019-01-14 NOTE — Progress Notes (Signed)
Date and time results received: 01/14/19 , I've just I got critical result from lab, blood drawn at 0242 am, phlebotomist sent specimen to central lab at 0600 am.  Test: CBC  Critical Value: Hb 6.2  Name of Provider Notified: paged  Dr. Genice Rouge, on-call provider  Orders Received: 2 units of PRBC  Or Actions Taken: awaiting for blood bank  Pt is asymptomatic, vital signs remain stable. Pt had one time minimal dark red bowel movement last night. No acute distress now. Will continue to monitor.  Kennyth Lose, RN

## 2019-01-14 NOTE — Progress Notes (Signed)
Call placed to [facility], niece.  Update given about pending Endo treatment today.  Will place note on the front of the chart for Dr. Tarri Glenn to contact her when done for updates.

## 2019-01-14 NOTE — Anesthesia Preprocedure Evaluation (Addendum)
Anesthesia Evaluation  Patient identified by MRN, date of birth, ID band Patient awake    Reviewed: Allergy & Precautions, H&P , NPO status , Patient's Chart, lab work & pertinent test results  Airway Mallampati: II   Neck ROM: full    Dental  (+) Dental Advisory Given   Pulmonary former smoker,    breath sounds clear to auscultation       Cardiovascular hypertension, +CHF  + dysrhythmias Atrial Fibrillation  Rhythm:regular Rate:Normal     Neuro/Psych PSYCHIATRIC DISORDERS Anxiety Depression Bipolar Disorder Dementia TIA   GI/Hepatic GERD  ,(+)     substance abuse  alcohol use, GI bleed   Endo/Other    Renal/GU      Musculoskeletal   Abdominal   Peds  Hematology  (+) Blood dyscrasia, anemia ,   Anesthesia Other Findings   Reproductive/Obstetrics                            Anesthesia Physical Anesthesia Plan  ASA: III  Anesthesia Plan: MAC   Post-op Pain Management:    Induction: Intravenous  PONV Risk Score and Plan: 2 and Propofol infusion and Treatment may vary due to age or medical condition  Airway Management Planned: Nasal Cannula  Additional Equipment:   Intra-op Plan:   Post-operative Plan:   Informed Consent: I have reviewed the patients History and Physical, chart, labs and discussed the procedure including the risks, benefits and alternatives for the proposed anesthesia with the patient or authorized representative who has indicated his/her understanding and acceptance.       Plan Discussed with: Anesthesiologist, Surgeon and CRNA  Anesthesia Plan Comments:         Anesthesia Quick Evaluation

## 2019-01-14 NOTE — Progress Notes (Addendum)
Daily Rounding Note  01/14/2019, 8:29 AM  LOS: 3 days   SUBJECTIVE:   Chief complaint: GIB.  Gastric and duodenal ulcers. Blood loss anemia.       Stool dark/burgundy with clots over night.  Pt feels well.  No nausea.  No tachycardia.  BPs tending soft though overall improved.  Has not been out of bed.    OBJECTIVE:         Vital signs in last 24 hours:    Temp:  [97.8 F (36.6 C)-98.6 F (37 C)] 97.8 F (36.6 C) (09/02 0755) Pulse Rate:  [57-74] 69 (09/02 0755) Resp:  [13-21] 13 (09/02 0755) BP: (105-120)/(48-62) 120/54 (09/02 0755) SpO2:  [93 %-100 %] 99 % (09/02 0755) Weight:  [64.9 kg] 64.9 kg (09/02 0338) Last BM Date: 01/13/19 Filed Weights   01/12/19 1245 01/13/19 0407 01/14/19 0338  Weight: 63.2 kg 65.6 kg 64.9 kg   General: pleasant.  Comfortable.  No distress.  Not ill looking   Heart: RRR, HR in 60s on monitor Chest: clear bil.  No dyspnea or cough Abdomen: soft, NT, ND, active BS  Extremities: no CCE Neuro/Psych:  Oriented to self, place, not date.  Appropriate.  Follows commands.    Intake/Output from previous day: 09/01 0701 - 09/02 0700 In: 1350 [P.O.:850; I.V.:250; IV Piggyback:250] Out: 1500 [Urine:1500]  Intake/Output this shift: No intake/output data recorded.  Lab Results: Recent Labs    01/12/19 1123  01/13/19 0238 01/13/19 0948 01/14/19 0242  WBC 21.2*  --  14.0*  --  10.6*  HGB 9.2*   < > 7.1* 7.2* 6.2*  HCT 27.5*   < > 21.0* 22.1* 19.2*  PLT 270  --  236  --  240   < > = values in this interval not displayed.   BMET Recent Labs    01/11/19 2135 01/12/19 1123 01/14/19 0242  NA 140 140 142  K 4.0 4.8 3.6  CL 109 110 113*  CO2 19* 20* 23  GLUCOSE 178* 109* 91  BUN 25* 37* 31*  CREATININE 1.50* 1.23* 0.93  CALCIUM 7.2* 6.9* 7.3*   LFT Recent Labs    01/11/19 2135  PROT 4.2*  ALBUMIN 1.9*  AST 11*  ALT 8  ALKPHOS 51  BILITOT 0.4   PT/INR Recent Labs   01/11/19 2135  LABPROT 15.2  INR 1.2   Hepatitis Panel No results for input(s): HEPBSAG, HCVAB, HEPAIGM, HEPBIGM in the last 72 hours.  Studies/Results: Dg Chest Port 1 View  Result Date: 01/13/2019 CLINICAL DATA:  Follow-up pneumonia EXAM: PORTABLE CHEST 1 VIEW COMPARISON:  Chest radiograph, 10/12/2017, CT abdomen pelvis, 01/11/2019 FINDINGS: Mild cardiomegaly. Aortic atherosclerosis. Subtle, diffuse, scattered heterogeneous airspace opacities bilaterally. The visualized skeletal structures are unremarkable. IMPRESSION: Subtle, diffuse, scattered heterogeneous airspace opacities bilaterally, better appreciated by previous CT although most in keeping with multifocal infection. Electronically Signed   By: Eddie Candle M.D.   On: 01/13/2019 08:57   Scheduled Meds: . sodium chloride   Intravenous Once  . ARIPiprazole  5 mg Oral Daily  . cholecalciferol  1,000 Units Oral Daily  . donepezil  10 mg Oral QHS  . gabapentin  200 mg Oral BID  . lamoTRIgine  400 mg Oral Daily  . rosuvastatin  40 mg Oral Daily  . sertraline  200 mg Oral Daily  . sodium chloride flush  3 mL Intravenous Q12H  . thiamine  250 mg Oral BID   Continuous  Infusions: . cefTRIAXone (ROCEPHIN)  IV 1 g (01/13/19 1822)  . pantoprozole (PROTONIX) infusion 8 mg/hr (01/13/19 1531)  . vancomycin 750 mg (01/13/19 1946)   PRN Meds:.acetaminophen **OR** acetaminophen, clonazePAM, ondansetron **OR** ondansetron (ZOFRAN) IV   ASSESMENT:   *    CGE.  Dark stools.  Taking Mobic, 325 ASA PTA.  May have recurrent bleeding 01/12/19 EGD: multiple, non-bleeding GUs. Bx'd.  Solitary DU with adherent clot is high risk to re bleed. These findings explain CT findings of gastric, prox duodenal thickening  Day 2.5 of 72 h Protonix drip but day 4 IV Protonix. Azotemia improved.   Stool H Pylori Ag study ordered, not yet collected.       *    Multifocal PNA.  On Rocephin, Vanc. Non-productive cough.   WBCs 21.2 >> 14 >> 10.6.Marland Kitchen     *     Blood loss anemia.  Chronic iron for hx IDA.  Hgb 3.8 >> 4 PRBCs >> 9.1 >> 7.2 >> 6.2.  The change from 3.8 to 7.1 is an appropriate response to 4 units.   Ferritin is 9, iron <5: IDA   *   Hypoalbuminemia.    PLAN   *    EGD 330 this afternoon.     *    Feraheme.    *   Transfuse 2 PRBC per hospitalist, Dr Benny Lennert today.   Hgb at noon, need this befor BB willl release unit #2.  CBC, prealbumin in AM.      [name]  01/14/2019, 8:29 AM Phone [phone]

## 2019-01-15 ENCOUNTER — Inpatient Hospital Stay (HOSPITAL_COMMUNITY): Payer: Medicare Other

## 2019-01-15 ENCOUNTER — Encounter (HOSPITAL_COMMUNITY): Payer: Self-pay | Admitting: Gastroenterology

## 2019-01-15 DIAGNOSIS — J189 Pneumonia, unspecified organism: Secondary | ICD-10-CM

## 2019-01-15 DIAGNOSIS — I482 Chronic atrial fibrillation, unspecified: Secondary | ICD-10-CM

## 2019-01-15 DIAGNOSIS — F319 Bipolar disorder, unspecified: Secondary | ICD-10-CM

## 2019-01-15 HISTORY — PX: IR ANGIOGRAM SELECTIVE EACH ADDITIONAL VESSEL: IMG667

## 2019-01-15 HISTORY — PX: IR ANGIOGRAM VISCERAL SELECTIVE: IMG657

## 2019-01-15 HISTORY — PX: IR US GUIDE VASC ACCESS RIGHT: IMG2390

## 2019-01-15 HISTORY — PX: IR EMBO ART  VEN HEMORR LYMPH EXTRAV  INC GUIDE ROADMAPPING: IMG5450

## 2019-01-15 LAB — TYPE AND SCREEN
ABO/RH(D): A POS
Antibody Screen: NEGATIVE
Unit division: 0
Unit division: 0
Unit division: 0
Unit division: 0
Unit division: 0
Unit division: 0

## 2019-01-15 LAB — BPAM RBC
Blood Product Expiration Date: 202009042359
Blood Product Expiration Date: 202009202359
Blood Product Expiration Date: 202009202359
Blood Product Expiration Date: 202009212359
Blood Product Expiration Date: 202009222359
Blood Product Expiration Date: 202009222359
ISSUE DATE / TIME: 202008302313
ISSUE DATE / TIME: 202008310201
ISSUE DATE / TIME: 202008310441
ISSUE DATE / TIME: 202008310749
ISSUE DATE / TIME: 202009020708
ISSUE DATE / TIME: 202009022139
Unit Type and Rh: 600
Unit Type and Rh: 6200
Unit Type and Rh: 6200
Unit Type and Rh: 6200
Unit Type and Rh: 6200
Unit Type and Rh: 6200

## 2019-01-15 LAB — CBC
HCT: 25.7 % — ABNORMAL LOW (ref 36.0–46.0)
Hemoglobin: 8.3 g/dL — ABNORMAL LOW (ref 12.0–15.0)
MCH: 28.3 pg (ref 26.0–34.0)
MCHC: 32.3 g/dL (ref 30.0–36.0)
MCV: 87.7 fL (ref 80.0–100.0)
Platelets: 226 10*3/uL (ref 150–400)
RBC: 2.93 MIL/uL — ABNORMAL LOW (ref 3.87–5.11)
RDW: 16 % — ABNORMAL HIGH (ref 11.5–15.5)
WBC: 8.4 10*3/uL (ref 4.0–10.5)
nRBC: 0 % (ref 0.0–0.2)

## 2019-01-15 LAB — PREALBUMIN: Prealbumin: 13.6 mg/dL — ABNORMAL LOW (ref 18–38)

## 2019-01-15 LAB — HEMOGLOBIN AND HEMATOCRIT, BLOOD
HCT: 21.9 % — ABNORMAL LOW (ref 36.0–46.0)
HCT: 19.7 % — ABNORMAL LOW (ref 36.0–46.0)
Hemoglobin: 7.3 g/dL — ABNORMAL LOW (ref 12.0–15.0)
Hemoglobin: 6.4 g/dL — CL (ref 12.0–15.0)

## 2019-01-15 LAB — PREPARE RBC (CROSSMATCH)

## 2019-01-15 LAB — GLUCOSE, CAPILLARY: Glucose-Capillary: 91 mg/dL (ref 70–99)

## 2019-01-15 MED ORDER — SODIUM CHLORIDE 0.9 % IV SOLN
8.0000 mg/h | INTRAVENOUS | Status: AC
  Administered 2019-01-15 – 2019-01-18 (×6): 8 mg/h via INTRAVENOUS
  Filled 2019-01-15 (×8): qty 80

## 2019-01-15 MED ORDER — SODIUM CHLORIDE 0.9% IV SOLUTION: Freq: Once | INTRAVENOUS | Status: DC

## 2019-01-15 MED ORDER — PANTOPRAZOLE SODIUM 40 MG IV SOLR: 40.0000 mg | Freq: Two times a day (BID) | INTRAVENOUS | Status: DC

## 2019-01-15 MED ORDER — FENTANYL CITRATE (PF) 100 MCG/2ML IJ SOLN
INTRAMUSCULAR | Status: AC
  Filled 2019-01-15: qty 2

## 2019-01-15 MED ORDER — MIDAZOLAM HCL 2 MG/2ML IJ SOLN
INTRAMUSCULAR | Status: AC | PRN
  Administered 2019-01-15: 0.5 mg via INTRAVENOUS

## 2019-01-15 MED ORDER — FENTANYL CITRATE (PF) 100 MCG/2ML IJ SOLN
INTRAMUSCULAR | Status: AC | PRN
  Administered 2019-01-15: 25 ug via INTRAVENOUS

## 2019-01-15 MED ORDER — LIDOCAINE HCL 1 % IJ SOLN
INTRAMUSCULAR | Status: AC
  Filled 2019-01-15: qty 20

## 2019-01-15 MED ORDER — IOHEXOL 300 MG/ML  SOLN
75.0000 mL | Freq: Once | INTRAMUSCULAR | Status: AC | PRN
  Administered 2019-01-15: 75 mL via INTRA_ARTERIAL

## 2019-01-15 MED ORDER — MIDAZOLAM HCL 2 MG/2ML IJ SOLN
INTRAMUSCULAR | Status: AC
  Filled 2019-01-15: qty 2

## 2019-01-15 NOTE — Progress Notes (Signed)
Pharmacy Antibiotic Note  Angelica Kelley is a 73 y.o. female admitted on 01/11/2019 with concern for PNA. Pharmacy has been consulted for Vancomycin dosing along with Rocephin per MD.  Day # 3 of antibiotics - > afebrile, WBC WNL  MRSA PCR negative  Plan: Vancomycin 750 mg iv Q 24 hours - Consider stopping Ceftriaxone 1 gram iv Q 24 hours   Height: 5\' 6"  (167.6 cm) Weight: 141 lb 3.2 oz (64 kg) IBW/kg (Calculated) : 59.3  Temp (24hrs), Avg:98.2 F (36.8 C), Min:97.7 F (36.5 C), Max:99 F (37.2 C)  Recent Labs  Lab 01/11/19 2135 01/12/19 1123 01/13/19 0238 01/14/19 0242 01/15/19 0306  WBC 12.2* 21.2* 14.0* 10.6* 8.4  CREATININE 1.50* 1.23*  --  0.93  --     Estimated Creatinine Clearance: 48.9 mL/min (by C-G formula based on SCr of 0.93 mg/dL).    Allergies  Allergen Reactions  . Lipitor [Atorvastatin]     Fatigue  . Prednisone Other (See Comments)    Change in mental status    Antimicrobials this admission: CTX 8/30 >> Vanc 8/31 >>  Dose adjustments this admission: n/a  Microbiology results: 8/30 COVID >> neg 8/31 RCx >>recollect 8/31 MRSA PCR >> negative  Thank you for allowing pharmacy to be a part of this patient's care. Anette Guarneri, PharmD [phone] 01/15/2019 9:01 AM   **Pharmacist phone directory can now be found on [email or web address] ([email or web address]).  Listed under [place].

## 2019-01-15 NOTE — Progress Notes (Signed)
          Daily Rounding Note  01/15/2019, 8:45 AM  LOS: 4 days   SUBJECTIVE:   Chief complaint:  GI bleed, gastric ulcers, duodenal ulcer.     Patient feels good.  Has not had any more stools or bleeding since prior to her EGD yesterday.  Previous abdominal discomfort is resolved.     OBJECTIVE:         Vital signs in last 24 hours:    Temp:  [97.7 F (36.5 C)-99 F (37.2 C)] 98.3 F (36.8 C) (09/03 0811) Pulse Rate:  [63-72] 66 (09/03 0811) Resp:  [11-23] 18 (09/03 0811) BP: (120-160)/(43-77) 131/60 (09/03 0811) SpO2:  [96 %-100 %] 100 % (09/03 0811) Weight:  [64 kg] 64 kg (09/03 0425) Last BM Date: 01/14/19 Filed Weights   01/14/19 0338 01/14/19 1504 01/15/19 0425  Weight: 64.9 kg 64 kg 64 kg   General: Looks well.  Comfortable.  Does not look ill. Heart: RRR with murmur. Chest: Clear bilaterally.  Good breath sounds.  No dyspnea, no cough. Abdomen: Nontender, nondistended.  Active bowel sounds. Extremities: No CCE. Neuro/Psych: Alert, follows commands.  Appropriate.  No gross deficits.  Oriented to self and place.  Intake/Output from previous day: 09/02 0701 - 09/03 0700 In: 3097.8 [P.O.:950; I.V.:982.8; Blood:915; IV Piggyback:250] Out: [id number] [Urine:3500; Stool:1]  Intake/Output this shift: No intake/output data recorded.  Lab Results: Recent Labs    01/13/19 0238  01/14/19 0242 01/14/19 1250 01/15/19 0306  WBC 14.0*  --  10.6*  --  8.4  HGB 7.1*   < > 6.2* 7.4* 8.3*  HCT 21.0*   < > 19.2* 22.6* 25.7*  PLT 236  --  240  --  226   < > = values in this interval not displayed.   BMET Recent Labs    01/12/19 1123 01/14/19 0242  NA 140 142  K 4.8 3.6  CL 110 113*  CO2 20* 23  GLUCOSE 109* 91  BUN 37* 31*  CREATININE 1.23* 0.93  CALCIUM 6.9* 7.3*     ASSESMENT:   *   CGE, dark and burgundy BM's.   01/12/19 EGD: multiple, non-bleeding GUs. Bx'd. Solitary DU w adherent clot, high risk to re bleed.   Explain CT findings of gastric, prox duodenal thickening. rebled in setting of day 2.5 PPI gtt 06/15/18 EGD: non-bleeding GUs, non-bleeding DU w pigmented material.  No evidence rebleeding 72 hours PPI gtt finishes today 9/3 at 1430.    Was taking Mobic, full dose aspirin and no PPI or H2 blocker PTA.  *   IDA anemia, ferritin 9.  S/p 6 PRBCs, latest completed 0100 today, and feraheme.   Hgb 3.8 >> 9.2 >> 6.2 >> 8.3.  Was taking oral iron and having dark stools PTA.    *   PNA.  Vanc, rocephin in place.  No cough or resp distress.        PLAN   *  Stool H Pylori Ag in process. If positive, add abx to PPI PPI BID for 6 weeks, then daily there after.    Ok to resume low dose ASA in 2 weeks, avoid Mobic.    *   CBC in AM.    *    Advance to [place] diet  *      Outpt GI follow-up with her primary GI physician, Dr Richmond Campbell.    [name]  01/15/2019, 8:45 AM Phone [phone]

## 2019-01-15 NOTE — Evaluation (Signed)
Physical Therapy Evaluation Patient Details Name: Angelica Kelley MRN: [id number] DOB: 02/25/1946 Today's Date: 01/15/2019   History of Present Illness  73 year old Caucasian female with past medical history significant for dementia, TIA, hypertension, hyperlipidemia, diastolic congestive heart failure, atrial fibrillation, alcoholism and iron deficiency anemia.  Patient was admitted following a fall and reported hematemesis and melena stools.   Clinical Impression  Patient received in bed, pleasant, agrees to PT evaluation. Patient performs bed mobility and transfers with supervision. No reports of dizziness. Patient then ambulated 250 feet without AD and supervision to min guard. No lob, normal walking pace. Patient appears to be at baseline level of function and does not require PT follow up at this time.       Follow Up Recommendations No PT follow up    Equipment Recommendations  None recommended by PT    Recommendations for Other Services       Precautions / Restrictions Precautions Precautions: Fall Restrictions Weight Bearing Restrictions: No      Mobility  Bed Mobility Overal bed mobility: Needs Assistance Bed Mobility: Supine to Sit     Supine to sit: Supervision     General bed mobility comments: supervision for safety  Transfers Overall transfer level: Needs assistance Equipment used: None Transfers: Sit to/from Stand Sit to Stand: Supervision            Ambulation/Gait Ambulation/Gait assistance: Supervision Gait Distance (Feet): 250 Feet Assistive device: None Gait Pattern/deviations: WFL(Within Functional Limits) Gait velocity: WNL Gait velocity interpretation: >2.62 ft/sec, indicative of community ambulatory General Gait Details: normal pace, no LOB or difficulty noted.  Stairs            Wheelchair Mobility    Modified Rankin (Stroke Patients Only)       Balance Overall balance assessment: Mild deficits observed, not formally  tested         Standing balance support: No upper extremity supported Standing balance-Leahy Scale: Good Standing balance comment: no LOB during ambulation                             Pertinent Vitals/Pain Pain Assessment: No/denies pain Faces Pain Scale: Hurts a little bit Pain Location: abdomen Pain Descriptors / Indicators: Sore Pain Intervention(s): Monitored during session;Repositioned    Home Living Family/patient expects to be discharged to:: Assisted living               Home Equipment: Shower seat      Prior Function Level of Independence: Independent         Comments: from ALF. unclear if assistance needed with grooming, UB/LB ADLs. Pt reports she uses no AD for mobility.      Hand Dominance   Dominant Hand: Right    Extremity/Trunk Assessment   Upper Extremity Assessment Upper Extremity Assessment: Defer to OT evaluation    Lower Extremity Assessment Lower Extremity Assessment: Overall WFL for tasks assessed    Cervical / Trunk Assessment Cervical / Trunk Assessment: Normal  Communication   Communication: No difficulties  Cognition Arousal/Alertness: Awake/alert Behavior During Therapy: WFL for tasks assessed/performed Overall Cognitive Status: History of cognitive impairments - at baseline                                        General Comments      Exercises     Assessment/Plan  PT Assessment Patent does not need any further PT services  PT Problem List Decreased mobility;Decreased safety awareness       PT Treatment Interventions      PT Goals (Current goals can be found in the Care Plan section)  Acute Rehab PT Goals Patient Stated Goal: feel better/stronger PT Goal Formulation: With patient Time For Goal Achievement: 01/20/19 Potential to Achieve Goals: Good    Frequency     Barriers to discharge        Co-evaluation               AM-PAC PT "6 Clicks" Mobility  Outcome  Measure Help needed turning from your back to your side while in a flat bed without using bedrails?: None Help needed moving from lying on your back to sitting on the side of a flat bed without using bedrails?: None Help needed moving to and from a bed to a chair (including a wheelchair)?: None Help needed standing up from a chair using your arms (e.g., wheelchair or bedside chair)?: None Help needed to walk in hospital room?: None Help needed climbing 3-5 steps with a railing? : None 6 Click Score: 24    End of Session Equipment Utilized During Treatment: Gait belt Activity Tolerance: Patient tolerated treatment well Patient left: in bed;with call bell/phone within reach Nurse Communication: Mobility status PT Visit Diagnosis: Other abnormalities of gait and mobility (R26.89);History of falling (Z91.81)    Time: [id number] PT Time Calculation (min) (ACUTE ONLY): 25 min   Charges:   PT Evaluation $PT Eval Moderate Complexity: 1 Mod PT Treatments $Gait Training: 8-22 mins        Rosetta Rupnow, PT, GCS 01/15/19,2:34 PM

## 2019-01-15 NOTE — Evaluation (Signed)
Occupational Therapy Evaluation Patient Details Name: Angelica Kelley MRN: [id number] DOB: [birth year] Today's Date: 01/15/2019    History of Present Illness 73 year old Caucasian female with past medical history significant for dementia, TIA, hypertension, hyperlipidemia, diastolic congestive heart failure, atrial fibrillation, alcoholism and iron deficiency anemia.  Patient was admitted following a fall and reported hematemesis and melena stools.    Clinical Impression   Pt admitted with the above diagnoses and presents with below problem list. Pt will benefit from continued acute OT to address the below listed deficits and maximize independence with basic ADLs prior to d/c back to her ALF. PTA pt reports she used no AD for ambulating, unclear if she needed assist with ADLs but suspect she was at least setup/supervision level. Pt presents with decreased activity tolerance and some generalized weakness. Pt currently setup to min guard with ADLs and functional transfers/mobility. Will follow acutely.      Follow Up Recommendations  No OT follow up;Supervision/Assistance - 24 hour(back to ALF)    Equipment Recommendations  None recommended by OT    Recommendations for Other Services       Precautions / Restrictions Precautions Precautions: Fall Restrictions Weight Bearing Restrictions: No      Mobility Bed Mobility Overal bed mobility: Needs Assistance Bed Mobility: Supine to Sit     Supine to sit: Supervision     General bed mobility comments: supervision for safety  Transfers Overall transfer level: Needs assistance Equipment used: None                  Balance Overall balance assessment: Needs assistance         Standing balance support: No upper extremity supported;During functional activity Standing balance-Leahy Scale: Fair Standing balance comment: occasional single extremity external support for dynamic tasks                           ADL  either performed or assessed with clinical judgement   ADL Overall ADL's : Needs assistance/impaired Eating/Feeding: Set up;Sitting   Grooming: Set up;Min guard;Sitting;Standing;Supervision/safety   Upper Body Bathing: Set up;Supervision/ safety;Sitting   Lower Body Bathing: Min guard;Sit to/from stand   Upper Body Dressing : Set up;Supervision/safety;Sitting   Lower Body Dressing: Min guard;Sit to/from stand   Toilet Transfer: Min guard;Ambulation   Toileting- Clothing Manipulation and Hygiene: Min guard;Sit to/from stand   Tub/ Shower Transfer: Walk-in shower;Min guard;Ambulation;Shower seat;Grab bars   Functional mobility during ADLs: Min guard General ADL Comments: Pt completed in room functional mobility, toilet transfer, pericare, and 1 grooming task standing at sink.      Vision         Perception     Praxis      Pertinent Vitals/Pain Pain Assessment: Faces Faces Pain Scale: Hurts a little bit Pain Location: abdomen Pain Descriptors / Indicators: Sore Pain Intervention(s): Monitored during session;Repositioned     Hand Dominance Right   Extremity/Trunk Assessment Upper Extremity Assessment Upper Extremity Assessment: Generalized weakness;Overall [facility] for tasks assessed   Lower Extremity Assessment Lower Extremity Assessment: Defer to PT evaluation       Communication Communication Communication: No difficulties   Cognition Arousal/Alertness: Awake/alert Behavior During Therapy: WFL for tasks assessed/performed(plesantly confused) Overall Cognitive Status: History of cognitive impairments - at baseline  General Comments       Exercises     Shoulder Instructions      Home Living Family/patient expects to be discharged to:: Assisted living                                        Prior Functioning/Environment Level of Independence: Independent;Needs assistance         Comments: from ALF. unclear if assistance needed with grooming, UB/LB ADLs. Pt reports she uses no AD for mobility.         OT Problem List: Decreased activity tolerance;Impaired balance (sitting and/or standing);Decreased knowledge of use of DME or AE;Decreased knowledge of precautions;Pain      OT Treatment/Interventions: Self-care/ADL training;Therapeutic exercise;Energy conservation;DME and/or AE instruction;Therapeutic activities;Balance training;Patient/family education    OT Goals(Current goals can be found in the care plan section) Acute Rehab OT Goals Patient Stated Goal: feel better/stronger OT Goal Formulation: With patient Time For Goal Achievement: 01/29/19 Potential to Achieve Goals: Good ADL Goals Pt Will Perform Lower Body Bathing: with supervision;sit to/from stand;with set-up Pt Will Perform Lower Body Dressing: sit to/from stand;with set-up;with supervision Pt Will Transfer to Toilet: with supervision;ambulating;with modified independence Pt Will Perform Toileting - Clothing Manipulation and hygiene: with modified independence;with supervision;sit to/from stand  OT Frequency: Min 2X/week   Barriers to D/C:            Co-evaluation              AM-PAC OT "6 Clicks" Daily Activity     Outcome Measure Help from another person eating meals?: None Help from another person taking care of personal grooming?: None Help from another person toileting, which includes using toliet, bedpan, or urinal?: A Little Help from another person bathing (including washing, rinsing, drying)?: A Little Help from another person to put on and taking off regular upper body clothing?: None Help from another person to put on and taking off regular lower body clothing?: A Little 6 Click Score: 21   End of Session    Activity Tolerance: Patient tolerated treatment well;Patient limited by fatigue Patient left: in chair;with call bell/phone within reach;with chair alarm set  OT  Visit Diagnosis: Unsteadiness on feet (R26.81);Muscle weakness (generalized) (M62.81);Pain                Time: [id number] OT Time Calculation (min): 22 min Charges:  OT General Charges $OT Visit: 1 Visit OT Evaluation $OT Eval Low Complexity: [place], OT Acute Rehabilitation Services Pager: [phone] Office: [phone]  Hortencia Pilar 01/15/2019, 1:37 PM

## 2019-01-15 NOTE — Procedures (Signed)
Interventional Radiology Procedure Note  Procedure: Visceral selective angiogram and coil/AVP embolization of the GDA.   Access: Right CFA, AngioSeal  Complications: None  Estimated Blood Loss: None  Recommendations: - Bedrest with leg straight x 3 hrs - Trend H&H, transfuse as needed  Signed,  Criselda Peaches, MD

## 2019-01-15 NOTE — Sedation Documentation (Signed)
Pt arrived to IR suite. Consent signed. Pt is alert and oriented, understands procedure. Pt vitals are stable, pt has no complaints at this time. Report given to E Ofori, rn

## 2019-01-15 NOTE — Progress Notes (Signed)
Pt vomited a large amount of bright red blood.  MD made aware.  Will continue to monitor.

## 2019-01-15 NOTE — Anesthesia Postprocedure Evaluation (Signed)
Anesthesia Post Note  Patient: Angelica Kelley  Procedure(s) Performed: ESOPHAGOGASTRODUODENOSCOPY (EGD) WITH PROPOFOL (N/A )     Patient location during evaluation: Endoscopy Anesthesia Type: MAC Level of consciousness: awake and alert Pain management: pain level controlled Vital Signs Assessment: post-procedure vital signs reviewed and stable Respiratory status: spontaneous breathing, nonlabored ventilation, respiratory function stable and patient connected to nasal cannula oxygen Cardiovascular status: blood pressure returned to baseline and stable Postop Assessment: no apparent nausea or vomiting Anesthetic complications: no    Last Vitals:  Vitals:   01/15/19 1200 01/15/19 1734  BP: (!) 146/63 (!) 104/37  Pulse: 66 77  Resp: 18 18  Temp: 36.6 C 36.7 C  SpO2: 100% 100%    Last Pain:  Vitals:   01/15/19 1734  TempSrc: Oral  PainSc: 0-No pain   Pain Goal: Patients Stated Pain Goal: 2 (01/12/19 0800)                 Darthy Manganelli S
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
## 2019-01-15 NOTE — Progress Notes (Addendum)
PROGRESS NOTE  Angelica Kelley [id number] DOB: [birth year] DOA: 01/11/2019 PCP: No primary care provider on file.  Brief History   Patient is a 73 year old Caucasian female with past medical history significant for dementia, TIA, hypertension, hyperlipidemia, diastolic congestive heart failure, atrial fibrillation, alcoholism and iron deficiency anemia.  Patient was admitted following a fall and reported hematemesis and melena stools.  On presentation to the hospital, hemoglobin was documented as 3.5 g/dL.  Last hemoglobin was 10 g/dL.  Other pertinent labs include serum creatinine had gone up from 0.75-1.5, albumin of 1.8, iron of less than 5, ferritin of 9, folate of 6.8 with INR of 1.2.  EGD done earlier today revealed: "Multiple non-bleeding gastric ulcers with no stigmata of bleeding".  Non-bleeding duodenal ulcer with adherent clot. This is at very high risk for re-bleeding". The patient is currently completing 72 hours of IV Protonix. GI will follow up with repeat EGD to reasses and perform biopsies.   The patient has received a total of 6 units of blood with 2 units having been transfused on 01/14/2019. Her hemoglobin this morning following these 2 units is 8.3 after being 6.2 yesterday before transfusion.   Consultants  . Gastroenterology  Procedures  . EGD  Antibiotics   Anti-infectives (From admission, onward)   Start     Dose/Rate Route Frequency Ordered Stop   01/13/19 2000  vancomycin (VANCOCIN) IVPB 750 mg/150 ml premix     750 mg 150 mL/hr over 60 Minutes Intravenous Every 24 hours 01/12/19 1925     01/12/19 1930  vancomycin (VANCOCIN) IVPB 1000 mg/200 mL premix     1,000 mg 200 mL/hr over 60 Minutes Intravenous  Once 01/12/19 1839 01/12/19 2317   01/12/19 1830  cefTRIAXone (ROCEPHIN) 1 g in sodium chloride 0.9 % 100 mL IVPB     1 g 200 mL/hr over 30 Minutes Intravenous Every 24 hours 01/12/19 1816     01/11/19 2200  cefTRIAXone (ROCEPHIN) 1 g in sodium chloride 0.9 % 100  mL IVPB     1 g 200 mL/hr over 30 Minutes Intravenous  Once 01/11/19 2155 01/11/19 2254     Subjective  The patient is resting comfortably. No new complaints.  Objective   Vitals:  Vitals:   01/15/19 0811 01/15/19 1200  BP: 131/60 (!) 146/63  Pulse: 66 66  Resp: 18 18  Temp: 98.3 F (36.8 C) 97.8 F (36.6 C)  SpO2: 100% 100%    Exam:  Constitutional:  . Appears calm and comfortable Respiratory:  . No increased work of breathing. . No wheezes, rales, or rhonchi. . No tactile fremitus. Cardiovascular:  . Regular rate and rhythm. . No murmurs, ectopy, or gallups. . No lateral PMI. No thrill. Abdomen:  . Abdomen is soft, non-tender, non-distended. . No hernias, masses, or organomegaly. . Normoactive bowel sounds.  Musculoskeletal:  . No cyanosis, clubbing, or edema. Skin:  . No rashes, lesions, ulcers . palpation of skin: no induration or nodules Neurologic:  . CN 2-12 intact . Sensation all 4 extremities intact Psychiatric:  . Mental status o Mood, affect appropriate o Orientation to person, place, time  . judgment and insight appear intact     I have personally reviewed the following:   Today's Data  . Vitals, CBC, hemoglobin/hematorcrit, procalcitonin  Imaging  . CXR - multifocal pulmonary opacities.   Scheduled Meds: . sodium chloride   Intravenous Once  . ARIPiprazole  5 mg Oral Daily  . cholecalciferol  1,000 Units Oral Daily  .  donepezil  10 mg Oral QHS  . gabapentin  200 mg Oral BID  . lamoTRIgine  400 mg Oral Daily  . pantoprazole (PROTONIX) IV  40 mg Intravenous Q12H  . rosuvastatin  40 mg Oral Daily  . sertraline  200 mg Oral Daily  . sodium chloride flush  3 mL Intravenous Q12H  . thiamine  250 mg Oral BID   Continuous Infusions: . cefTRIAXone (ROCEPHIN)  IV Stopped (01/14/19 1948)  . vancomycin Stopped (01/14/19 2231)    Active Problems:   Acute blood loss anemia   LOS: 4 days   A & P   Acute blood loss anemia 2/2 upper  GI bleed (see EGD report above): Continue Protonix drip x 72 hours. Repeat EGD as per demonstrated gastric ulcer and duodenal ulcer. The patient has received a total of 6 units of blood with 2 units having been transfused on 01/14/2019. Her hemoglobin this morning following these 2 units is 8.3 after being 6.2 yesterday before transfusion.  Hold ASA and VTE ppx. I appreciate GI's assistance. They have recommended that we continue to monitor her hemoglobin.  Gastric ulcer and duodenal ulcer: Currently see above.  Bibasilar groundglass opacities: Atypical pneumonia. Continue Rocephin and Vancomycin. Monitor. Procalcitonin is equivocal at 0.25.  Bipolar disorder: continue home Zoloft, Lamictaland Aricept daily, Klonopin once daily as needed  Overactive bladder: continue home Myrbetriq  HLD: continue home statin  Afib: not on rate control or anticoagulation, continue to monitor  I have seen and examined this patient myself. I have spent 35 minutes in her evaluation and care.  DVT prophylaxis: SCD Code Status: Full code Family Communication:  Disposition Plan: Depend on hospital course  Frederico Gerling, DO [facility] Direct contact: see [email or web address]  7PM-7AM contact night coverage as above 01/15/2019, 4:01 PM  LOS: 2 days   ADDENDUM: At roughly an hour ago I was contacted by nursing and advised that the patient had vomited up a large quantity of bright red blood. I have ordered and stat H&H. I have attempted to reach Dr. Tarri Glenn, but have had no success. I have just put into a page for [name], and am awaiting her call back. I will put the patient back on IV protonix, check coags, and platelets.

## 2019-01-16 ENCOUNTER — Encounter (HOSPITAL_COMMUNITY): Payer: Self-pay | Admitting: Interventional Radiology

## 2019-01-16 LAB — BASIC METABOLIC PANEL
Anion gap: 6 (ref 5–15)
BUN: 22 mg/dL (ref 8–23)
CO2: 23 mmol/L (ref 22–32)
Calcium: 7.2 mg/dL — ABNORMAL LOW (ref 8.9–10.3)
Chloride: 110 mmol/L (ref 98–111)
Creatinine, Ser: 0.83 mg/dL (ref 0.44–1.00)
GFR calc Af Amer: >60 mL/min (ref >60)
GFR calc non Af Amer: >60 mL/min (ref >60)
Glucose, Bld: 99 mg/dL (ref 70–99)
Potassium: 3.8 mmol/L (ref 3.5–5.1)
Sodium: 139 mmol/L (ref 135–145)

## 2019-01-16 LAB — CBC
HCT: 22.5 % — ABNORMAL LOW (ref 36.0–46.0)
Hemoglobin: 7.3 g/dL — ABNORMAL LOW (ref 12.0–15.0)
MCH: 29.1 pg (ref 26.0–34.0)
MCHC: 32.4 g/dL (ref 30.0–36.0)
MCV: 89.6 fL (ref 80.0–100.0)
Platelets: 213 10*3/uL (ref 150–400)
RBC: 2.51 MIL/uL — ABNORMAL LOW (ref 3.87–5.11)
RDW: 15.7 % — ABNORMAL HIGH (ref 11.5–15.5)
WBC: 8.2 10*3/uL (ref 4.0–10.5)
nRBC: 0 % (ref 0.0–0.2)

## 2019-01-16 LAB — HEMOGLOBIN AND HEMATOCRIT, BLOOD
HCT: 23.6 % — ABNORMAL LOW (ref 36.0–46.0)
HCT: 22 % — ABNORMAL LOW (ref 36.0–46.0)
Hemoglobin: 7.7 g/dL — ABNORMAL LOW (ref 12.0–15.0)
Hemoglobin: 7.1 g/dL — ABNORMAL LOW (ref 12.0–15.0)

## 2019-01-16 LAB — H. PYLORI ANTIGEN, STOOL: H. Pylori Stool Ag, Eia: NEGATIVE

## 2019-01-16 LAB — GLUCOSE, CAPILLARY: Glucose-Capillary: 95 mg/dL (ref 70–99)

## 2019-01-16 NOTE — Progress Notes (Signed)
Daily Rounding Note  01/16/2019, 9:27 AM  LOS: 5 days   SUBJECTIVE:   Chief complaint:     No overnight bleeding or HD instability.    OBJECTIVE:         Vital signs in last 24 hours:    Temp:  [97.7 F (36.5 C)-98.4 F (36.9 C)] 97.7 F (36.5 C) (09/04 0753) Pulse Rate:  [62-78] 70 (09/04 0753) Resp:  [14-24] 18 (09/04 0753) BP: (89-171)/(37-63) 90/47 (09/04 0753) SpO2:  [96 %-100 %] 100 % (09/04 0753) Weight:  [64.1 kg] 64.1 kg (09/04 0422) Last BM Date: 01/14/19 Filed Weights   01/14/19 1504 01/15/19 0425 01/16/19 0422  Weight: 64 kg 64 kg 64.1 kg   General: sleeping, did not awaken for exam   Heart: RRR.  NSR on monitor HR in 60s Chest: non-labored breathing   Intake/Output from previous day: 09/03 0701 - 09/04 0700 In: 2231.9 [P.O.:960; I.V.:706.9; Blood:315; IV Piggyback:250] Out: 1200 [Urine:1200]  Intake/Output this shift: No intake/output data recorded.  Lab Results: Recent Labs    01/14/19 0242  01/15/19 0306 01/15/19 1641 01/15/19 2126 01/16/19 0317  WBC 10.6*  --  8.4  --   --  8.2  HGB 6.2*   < > 8.3* 7.3* 6.4* 7.3*  HCT 19.2*   < > 25.7* 21.9* 19.7* 22.5*  PLT 240  --  226  --   --  213   < > = values in this interval not displayed.   BMET Recent Labs    01/14/19 0242 01/16/19 0317  NA 142 139  K 3.6 3.8  CL 113* 110  CO2 23 23  GLUCOSE 91 99  BUN 31* 22  CREATININE 0.93 0.83  CALCIUM 7.3* 7.2*   LFT No results for input(s): PROT, ALBUMIN, AST, ALT, ALKPHOS, BILITOT, BILIDIR, IBILI in the last 72 hours. PT/INR No results for input(s): LABPROT, INR in the last 72 hours. Hepatitis Panel No results for input(s): HEPBSAG, HCVAB, HEPAIGM, HEPBIGM in the last 72 hours.  Studies/Results: Ir Angiogram Visceral Selective  Result Date: 01/16/2019 INDICATION: 73 year old female with severe peptic ulcer disease resulting in large gastric and duodenal ulcers and subsequent upper GI  bleed requiring 6 unit transfusion thus far. Endoscopic therapy is limited given the size of the ulcerations. Therefore, she presents for angiogram and embolization. EXAM: IR ULTRASOUND GUIDANCE VASC ACCESS RIGHT; ADDITIONAL ARTERIOGRAPHY; SELECTIVE VISCERAL ARTERIOGRAPHY; IR EMBO ART VEN HEMORR LYMPH EXTRAV INC GUIDE ROADMAPPING 1. Ultrasound-guided vascular access right common femoral artery 2. Catheterization of the splenic artery with arteriogram 3. Catheterization of the superior mesenteric artery with arteriogram 4. Catheterization of the common hepatic artery (arising directly from the aorta) with arteriogram 5. Catheterization of the gastroduodenal artery with arteriogram 6. Catheterization of the gastroepiploic artery with arteriogram 7. Coil and vascular plug embolization of the gastroduodenal artery MEDICATIONS: None ANESTHESIA/SEDATION: Moderate (conscious) sedation was employed during this procedure. A total of Versed 0.5 mg and Fentanyl 25 mcg was administered intravenously. Moderate Sedation Time: 23 minutes. The patient's level of consciousness and vital signs were monitored continuously by radiology nursing throughout the procedure under my direct supervision. CONTRAST:  32mL OMNIPAQUE IOHEXOL 300 MG/ML  SOLN FLUOROSCOPY TIME:  Fluoroscopy Time: 6 minutes 54 seconds (266 mGy). COMPLICATIONS: None immediate. PROCEDURE: Informed consent was obtained from the patient following explanation of the procedure, risks, benefits and alternatives. The patient understands, agrees and consents for the procedure. All questions were addressed. A time out was  performed prior to the initiation of the procedure. Maximal barrier sterile technique utilized including caps, mask, sterile gowns, sterile gloves, large sterile drape, hand hygiene, and Betadine prep. The right common femoral artery was interrogated with ultrasound and found to be widely patent. An image was obtained and stored for the medical record. Local  anesthesia was attained by infiltration with 1% lidocaine. A small dermatotomy was made. Under real-time sonographic guidance, the vessel was punctured with a 21 gauge micropuncture needle. Using standard technique, the initial micro needle was exchanged over a 0.018 micro wire for a transitional 4 [place] micro sheath. The micro sheath was then exchanged over a 0.035 wire for a 5 French vascular sheath. A C2 cobra catheter was advanced over a Bentson wire into the abdominal aorta. The catheter was used to select the first artery from the aorta. Arteriography was performed. This is a splenic artery arising independently from the thoracic aorta. The artery is highly tortuous. There is no collateral flow into the hepatic artery. No evidence of aneurysm or bleeding. C2 cobra catheter was used to select another artery arising from the aorta. Arteriography was performed. This is the superior mesenteric artery. Additionally, faint opacification of the right renal artery and a common hepatic artery arising directly from the aorta is notified on reflux. No evidence of replaced right hepatic artery or significant pancreaticoduodenal collaterals. The catheter was next used to select the common hepatic artery which arises directly from the aorta. Arteriography was performed. A large dorsal pancreatic artery is the first branch. The gastroduodenal artery is also visualized and appears slightly abnormal. Utilizing the Bentson wire, the C2 cobra catheter was used to select the gastroduodenal artery. Arteriography was performed. The mid segment of the gastroduodenal artery is highly abnormal with a beaded and irregular appearance. There is no evidence of active extravasation at this time, however the appearance of the artery suggests arterial injury and arterial spasm, likely from the overlying ulceration. This segment of artery is damaged an at high risk for continued bleeding. Therefore, a renegade ST microcatheter and Fathom  wire were introduced through the 5 French catheter. The injured segment of artery was carefully crossed with the Fathom 16 wire and the renegade STC advanced into the right gastroepiploic artery. Arteriography was performed confirming that the microcatheter is indeed beyond the injured segment of the artery and in the gastroepiploic artery. Coil embolization was then performed first using a 2 x 5 x 5.8 cm vortex interlock detachable coil. Next, a 5 x 15 cm helical interlock coil and a 6 x 20 cm helical interlock coil were deployed across the injured segment of the artery. Next, a 6 x 6.7 cm vortex push below coil was deployed. This brings is back into the more proximal and larger segment of the gastroduodenal artery. The microcatheter was removed. Final embolization was performed using a 6 x 11 mm Amplatzer vascular plug 4. The 5 French catheter was subsequently brought back into the common hepatic artery and arteriography was performed. There is no further flow within the injured segment of the gastroduodenal artery. Embolization is successful. The catheter was removed. Hemostasis was attained with the assistance of an Angio-Seal device. IMPRESSION: 1. Abnormal and injured segment of the gastroduodenal artery presumably in the region of duodenal ulceration. 2. Successful coil and a VP embolization of the injured gastroduodenal artery. 3. Variant anatomy, the common hepatic artery arises directly from the aorta. Signed, Criselda Peaches, MD, [place] [facility] Radiology Electronically Signed  By: Jacqulynn Cadet M.D.   On: 01/16/2019 08:49   Ir Angiogram Selective Each Additional Vessel  Result Date: 01/16/2019 INDICATION: 73 year old female with severe peptic ulcer disease resulting in large gastric and duodenal ulcers and subsequent upper GI bleed requiring 6 unit transfusion thus far. Endoscopic therapy is limited given the size of the ulcerations.  Therefore, she presents for angiogram and embolization. EXAM: IR ULTRASOUND GUIDANCE VASC ACCESS RIGHT; ADDITIONAL ARTERIOGRAPHY; SELECTIVE VISCERAL ARTERIOGRAPHY; IR EMBO ART VEN HEMORR LYMPH EXTRAV INC GUIDE ROADMAPPING 1. Ultrasound-guided vascular access right common femoral artery 2. Catheterization of the splenic artery with arteriogram 3. Catheterization of the superior mesenteric artery with arteriogram 4. Catheterization of the common hepatic artery (arising directly from the aorta) with arteriogram 5. Catheterization of the gastroduodenal artery with arteriogram 6. Catheterization of the gastroepiploic artery with arteriogram 7. Coil and vascular plug embolization of the gastroduodenal artery MEDICATIONS: None ANESTHESIA/SEDATION: Moderate (conscious) sedation was employed during this procedure. A total of Versed 0.5 mg and Fentanyl 25 mcg was administered intravenously. Moderate Sedation Time: 23 minutes. The patient's level of consciousness and vital signs were monitored continuously by radiology nursing throughout the procedure under my direct supervision. CONTRAST:  65mL OMNIPAQUE IOHEXOL 300 MG/ML  SOLN FLUOROSCOPY TIME:  Fluoroscopy Time: 6 minutes 54 seconds (266 mGy). COMPLICATIONS: None immediate. PROCEDURE: Informed consent was obtained from the patient following explanation of the procedure, risks, benefits and alternatives. The patient understands, agrees and consents for the procedure. All questions were addressed. A time out was performed prior to the initiation of the procedure. Maximal barrier sterile technique utilized including caps, mask, sterile gowns, sterile gloves, large sterile drape, hand hygiene, and Betadine prep. The right common femoral artery was interrogated with ultrasound and found to be widely patent. An image was obtained and stored for the medical record. Local anesthesia was attained by infiltration with 1% lidocaine. A small dermatotomy was made. Under real-time  sonographic guidance, the vessel was punctured with a 21 gauge micropuncture needle. Using standard technique, the initial micro needle was exchanged over a 0.018 micro wire for a transitional 4 [place] micro sheath. The micro sheath was then exchanged over a 0.035 wire for a 5 French vascular sheath. A C2 cobra catheter was advanced over a Bentson wire into the abdominal aorta. The catheter was used to select the first artery from the aorta. Arteriography was performed. This is a splenic artery arising independently from the thoracic aorta. The artery is highly tortuous. There is no collateral flow into the hepatic artery. No evidence of aneurysm or bleeding. C2 cobra catheter was used to select another artery arising from the aorta. Arteriography was performed. This is the superior mesenteric artery. Additionally, faint opacification of the right renal artery and a common hepatic artery arising directly from the aorta is notified on reflux. No evidence of replaced right hepatic artery or significant pancreaticoduodenal collaterals. The catheter was next used to select the common hepatic artery which arises directly from the aorta. Arteriography was performed. A large dorsal pancreatic artery is the first branch. The gastroduodenal artery is also visualized and appears slightly abnormal. Utilizing the Bentson wire, the C2 cobra catheter was used to select the gastroduodenal artery. Arteriography was performed. The mid segment of the gastroduodenal artery is highly abnormal with a beaded and irregular appearance. There is no evidence of active extravasation at this time, however the appearance of the artery suggests arterial injury and arterial spasm, likely from the overlying ulceration. This segment of artery is damaged  an at high risk for continued bleeding. Therefore, a renegade ST microcatheter and Fathom wire were introduced through the 5 French catheter. The injured segment of artery was carefully crossed with  the Fathom 16 wire and the renegade STC advanced into the right gastroepiploic artery. Arteriography was performed confirming that the microcatheter is indeed beyond the injured segment of the artery and in the gastroepiploic artery. Coil embolization was then performed first using a 2 x 5 x 5.8 cm vortex interlock detachable coil. Next, a 5 x 15 cm helical interlock coil and a 6 x 20 cm helical interlock coil were deployed across the injured segment of the artery. Next, a 6 x 6.7 cm vortex push below coil was deployed. This brings is back into the more proximal and larger segment of the gastroduodenal artery. The microcatheter was removed. Final embolization was performed using a 6 x 11 mm Amplatzer vascular plug 4. The 5 French catheter was subsequently brought back into the common hepatic artery and arteriography was performed. There is no further flow within the injured segment of the gastroduodenal artery. Embolization is successful. The catheter was removed. Hemostasis was attained with the assistance of an Angio-Seal device. IMPRESSION: 1. Abnormal and injured segment of the gastroduodenal artery presumably in the region of duodenal ulceration. 2. Successful coil and a VP embolization of the injured gastroduodenal artery. 3. Variant anatomy, the common hepatic artery arises directly from the aorta. Signed, Criselda Peaches, MD, [place] [facility] Radiology Electronically Signed   By: Jacqulynn Cadet M.D.   On: 01/16/2019 08:49   Ir Angiogram Selective Each Additional Vessel  Result Date: 01/16/2019 INDICATION: 73 year old female with severe peptic ulcer disease resulting in large gastric and duodenal ulcers and subsequent upper GI bleed requiring 6 unit transfusion thus far. Endoscopic therapy is limited given the size of the ulcerations. Therefore, she presents for angiogram and embolization. EXAM: IR ULTRASOUND GUIDANCE VASC ACCESS RIGHT; ADDITIONAL  ARTERIOGRAPHY; SELECTIVE VISCERAL ARTERIOGRAPHY; IR EMBO ART VEN HEMORR LYMPH EXTRAV INC GUIDE ROADMAPPING 1. Ultrasound-guided vascular access right common femoral artery 2. Catheterization of the splenic artery with arteriogram 3. Catheterization of the superior mesenteric artery with arteriogram 4. Catheterization of the common hepatic artery (arising directly from the aorta) with arteriogram 5. Catheterization of the gastroduodenal artery with arteriogram 6. Catheterization of the gastroepiploic artery with arteriogram 7. Coil and vascular plug embolization of the gastroduodenal artery MEDICATIONS: None ANESTHESIA/SEDATION: Moderate (conscious) sedation was employed during this procedure. A total of Versed 0.5 mg and Fentanyl 25 mcg was administered intravenously. Moderate Sedation Time: 23 minutes. The patient's level of consciousness and vital signs were monitored continuously by radiology nursing throughout the procedure under my direct supervision. CONTRAST:  11mL OMNIPAQUE IOHEXOL 300 MG/ML  SOLN FLUOROSCOPY TIME:  Fluoroscopy Time: 6 minutes 54 seconds (266 mGy). COMPLICATIONS: None immediate. PROCEDURE: Informed consent was obtained from the patient following explanation of the procedure, risks, benefits and alternatives. The patient understands, agrees and consents for the procedure. All questions were addressed. A time out was performed prior to the initiation of the procedure. Maximal barrier sterile technique utilized including caps, mask, sterile gowns, sterile gloves, large sterile drape, hand hygiene, and Betadine prep. The right common femoral artery was interrogated with ultrasound and found to be widely patent. An image was obtained and stored for the medical record. Local anesthesia was attained by infiltration with 1% lidocaine. A small dermatotomy was made. Under real-time sonographic guidance, the vessel was punctured with a 21  gauge micropuncture needle. Using standard technique, the  initial micro needle was exchanged over a 0.018 micro wire for a transitional 4 [place] micro sheath. The micro sheath was then exchanged over a 0.035 wire for a 5 French vascular sheath. A C2 cobra catheter was advanced over a Bentson wire into the abdominal aorta. The catheter was used to select the first artery from the aorta. Arteriography was performed. This is a splenic artery arising independently from the thoracic aorta. The artery is highly tortuous. There is no collateral flow into the hepatic artery. No evidence of aneurysm or bleeding. C2 cobra catheter was used to select another artery arising from the aorta. Arteriography was performed. This is the superior mesenteric artery. Additionally, faint opacification of the right renal artery and a common hepatic artery arising directly from the aorta is notified on reflux. No evidence of replaced right hepatic artery or significant pancreaticoduodenal collaterals. The catheter was next used to select the common hepatic artery which arises directly from the aorta. Arteriography was performed. A large dorsal pancreatic artery is the first branch. The gastroduodenal artery is also visualized and appears slightly abnormal. Utilizing the Bentson wire, the C2 cobra catheter was used to select the gastroduodenal artery. Arteriography was performed. The mid segment of the gastroduodenal artery is highly abnormal with a beaded and irregular appearance. There is no evidence of active extravasation at this time, however the appearance of the artery suggests arterial injury and arterial spasm, likely from the overlying ulceration. This segment of artery is damaged an at high risk for continued bleeding. Therefore, a renegade ST microcatheter and Fathom wire were introduced through the 5 French catheter. The injured segment of artery was carefully crossed with the Fathom 16 wire and the renegade STC advanced into the right gastroepiploic artery. Arteriography was performed  confirming that the microcatheter is indeed beyond the injured segment of the artery and in the gastroepiploic artery. Coil embolization was then performed first using a 2 x 5 x 5.8 cm vortex interlock detachable coil. Next, a 5 x 15 cm helical interlock coil and a 6 x 20 cm helical interlock coil were deployed across the injured segment of the artery. Next, a 6 x 6.7 cm vortex push below coil was deployed. This brings is back into the more proximal and larger segment of the gastroduodenal artery. The microcatheter was removed. Final embolization was performed using a 6 x 11 mm Amplatzer vascular plug 4. The 5 French catheter was subsequently brought back into the common hepatic artery and arteriography was performed. There is no further flow within the injured segment of the gastroduodenal artery. Embolization is successful. The catheter was removed. Hemostasis was attained with the assistance of an Angio-Seal device. IMPRESSION: 1. Abnormal and injured segment of the gastroduodenal artery presumably in the region of duodenal ulceration. 2. Successful coil and a VP embolization of the injured gastroduodenal artery. 3. Variant anatomy, the common hepatic artery arises directly from the aorta. Signed, Criselda Peaches, MD, [place] [facility] Radiology Electronically Signed   By: Jacqulynn Cadet M.D.   On: 01/16/2019 08:49   Ir Angiogram Selective Each Additional Vessel  Result Date: 01/16/2019 INDICATION: 73 year old female with severe peptic ulcer disease resulting in large gastric and duodenal ulcers and subsequent upper GI bleed requiring 6 unit transfusion thus far. Endoscopic therapy is limited given the size of the ulcerations. Therefore, she presents for angiogram and embolization. EXAM: IR ULTRASOUND GUIDANCE VASC ACCESS RIGHT; ADDITIONAL ARTERIOGRAPHY; SELECTIVE VISCERAL  ARTERIOGRAPHY; IR EMBO ART VEN HEMORR LYMPH EXTRAV INC GUIDE ROADMAPPING 1.  Ultrasound-guided vascular access right common femoral artery 2. Catheterization of the splenic artery with arteriogram 3. Catheterization of the superior mesenteric artery with arteriogram 4. Catheterization of the common hepatic artery (arising directly from the aorta) with arteriogram 5. Catheterization of the gastroduodenal artery with arteriogram 6. Catheterization of the gastroepiploic artery with arteriogram 7. Coil and vascular plug embolization of the gastroduodenal artery MEDICATIONS: None ANESTHESIA/SEDATION: Moderate (conscious) sedation was employed during this procedure. A total of Versed 0.5 mg and Fentanyl 25 mcg was administered intravenously. Moderate Sedation Time: 23 minutes. The patient's level of consciousness and vital signs were monitored continuously by radiology nursing throughout the procedure under my direct supervision. CONTRAST:  45mL OMNIPAQUE IOHEXOL 300 MG/ML  SOLN FLUOROSCOPY TIME:  Fluoroscopy Time: 6 minutes 54 seconds (266 mGy). COMPLICATIONS: None immediate. PROCEDURE: Informed consent was obtained from the patient following explanation of the procedure, risks, benefits and alternatives. The patient understands, agrees and consents for the procedure. All questions were addressed. A time out was performed prior to the initiation of the procedure. Maximal barrier sterile technique utilized including caps, mask, sterile gowns, sterile gloves, large sterile drape, hand hygiene, and Betadine prep. The right common femoral artery was interrogated with ultrasound and found to be widely patent. An image was obtained and stored for the medical record. Local anesthesia was attained by infiltration with 1% lidocaine. A small dermatotomy was made. Under real-time sonographic guidance, the vessel was punctured with a 21 gauge micropuncture needle. Using standard technique, the initial micro needle was exchanged over a 0.018 micro wire for a transitional 4 [place] micro sheath. The micro  sheath was then exchanged over a 0.035 wire for a 5 French vascular sheath. A C2 cobra catheter was advanced over a Bentson wire into the abdominal aorta. The catheter was used to select the first artery from the aorta. Arteriography was performed. This is a splenic artery arising independently from the thoracic aorta. The artery is highly tortuous. There is no collateral flow into the hepatic artery. No evidence of aneurysm or bleeding. C2 cobra catheter was used to select another artery arising from the aorta. Arteriography was performed. This is the superior mesenteric artery. Additionally, faint opacification of the right renal artery and a common hepatic artery arising directly from the aorta is notified on reflux. No evidence of replaced right hepatic artery or significant pancreaticoduodenal collaterals. The catheter was next used to select the common hepatic artery which arises directly from the aorta. Arteriography was performed. A large dorsal pancreatic artery is the first branch. The gastroduodenal artery is also visualized and appears slightly abnormal. Utilizing the Bentson wire, the C2 cobra catheter was used to select the gastroduodenal artery. Arteriography was performed. The mid segment of the gastroduodenal artery is highly abnormal with a beaded and irregular appearance. There is no evidence of active extravasation at this time, however the appearance of the artery suggests arterial injury and arterial spasm, likely from the overlying ulceration. This segment of artery is damaged an at high risk for continued bleeding. Therefore, a renegade ST microcatheter and Fathom wire were introduced through the 5 French catheter. The injured segment of artery was carefully crossed with the Fathom 16 wire and the renegade STC advanced into the right gastroepiploic artery. Arteriography was performed confirming that the microcatheter is indeed beyond the injured segment of the artery and in the gastroepiploic  artery. Coil embolization was then performed first using a 2  x 5 x 5.8 cm vortex interlock detachable coil. Next, a 5 x 15 cm helical interlock coil and a 6 x 20 cm helical interlock coil were deployed across the injured segment of the artery. Next, a 6 x 6.7 cm vortex push below coil was deployed. This brings is back into the more proximal and larger segment of the gastroduodenal artery. The microcatheter was removed. Final embolization was performed using a 6 x 11 mm Amplatzer vascular plug 4. The 5 French catheter was subsequently brought back into the common hepatic artery and arteriography was performed. There is no further flow within the injured segment of the gastroduodenal artery. Embolization is successful. The catheter was removed. Hemostasis was attained with the assistance of an Angio-Seal device. IMPRESSION: 1. Abnormal and injured segment of the gastroduodenal artery presumably in the region of duodenal ulceration. 2. Successful coil and a VP embolization of the injured gastroduodenal artery. 3. Variant anatomy, the common hepatic artery arises directly from the aorta. Signed, Criselda Peaches, MD, [facility] Radiology Electronically Signed   By: Jacqulynn Cadet M.D.   On: 01/16/2019 08:49   Ir US Guide Vasc Access Right  Result Date: 01/16/2019 INDICATION: 73 year old female with severe peptic ulcer disease resulting in large gastric and duodenal ulcers and subsequent upper GI bleed requiring 6 unit transfusion thus far. Endoscopic therapy is limited given the size of the ulcerations. Therefore, she presents for angiogram and embolization. EXAM: IR ULTRASOUND GUIDANCE VASC ACCESS RIGHT; ADDITIONAL ARTERIOGRAPHY; SELECTIVE VISCERAL ARTERIOGRAPHY; IR EMBO ART VEN HEMORR LYMPH EXTRAV INC GUIDE ROADMAPPING 1. Ultrasound-guided vascular access right common femoral artery 2. Catheterization of the splenic artery with arteriogram 3.  Catheterization of the superior mesenteric artery with arteriogram 4. Catheterization of the common hepatic artery (arising directly from the aorta) with arteriogram 5. Catheterization of the gastroduodenal artery with arteriogram 6. Catheterization of the gastroepiploic artery with arteriogram 7. Coil and vascular plug embolization of the gastroduodenal artery MEDICATIONS: None ANESTHESIA/SEDATION: Moderate (conscious) sedation was employed during this procedure. A total of Versed 0.5 mg and Fentanyl 25 mcg was administered intravenously. Moderate Sedation Time: 23 minutes. The patient's level of consciousness and vital signs were monitored continuously by radiology nursing throughout the procedure under my direct supervision. CONTRAST:  44mL OMNIPAQUE IOHEXOL 300 MG/ML  SOLN FLUOROSCOPY TIME:  Fluoroscopy Time: 6 minutes 54 seconds (266 mGy). COMPLICATIONS: None immediate. PROCEDURE: Informed consent was obtained from the patient following explanation of the procedure, risks, benefits and alternatives. The patient understands, agrees and consents for the procedure. All questions were addressed. A time out was performed prior to the initiation of the procedure. Maximal barrier sterile technique utilized including caps, mask, sterile gowns, sterile gloves, large sterile drape, hand hygiene, and Betadine prep. The right common femoral artery was interrogated with ultrasound and found to be widely patent. An image was obtained and stored for the medical record. Local anesthesia was attained by infiltration with 1% lidocaine. A small dermatotomy was made. Under real-time sonographic guidance, the vessel was punctured with a 21 gauge micropuncture needle. Using standard technique, the initial micro needle was exchanged over a 0.018 micro wire for a transitional 4 [place] micro sheath. The micro sheath was then exchanged over a 0.035 wire for a 5 French vascular sheath. A C2 cobra catheter was advanced over a Bentson wire  into the abdominal aorta. The catheter was used to select the first artery from the aorta. Arteriography was performed. This is a splenic artery arising  independently from the thoracic aorta. The artery is highly tortuous. There is no collateral flow into the hepatic artery. No evidence of aneurysm or bleeding. C2 cobra catheter was used to select another artery arising from the aorta. Arteriography was performed. This is the superior mesenteric artery. Additionally, faint opacification of the right renal artery and a common hepatic artery arising directly from the aorta is notified on reflux. No evidence of replaced right hepatic artery or significant pancreaticoduodenal collaterals. The catheter was next used to select the common hepatic artery which arises directly from the aorta. Arteriography was performed. A large dorsal pancreatic artery is the first branch. The gastroduodenal artery is also visualized and appears slightly abnormal. Utilizing the Bentson wire, the C2 cobra catheter was used to select the gastroduodenal artery. Arteriography was performed. The mid segment of the gastroduodenal artery is highly abnormal with a beaded and irregular appearance. There is no evidence of active extravasation at this time, however the appearance of the artery suggests arterial injury and arterial spasm, likely from the overlying ulceration. This segment of artery is damaged an at high risk for continued bleeding. Therefore, a renegade ST microcatheter and Fathom wire were introduced through the 5 French catheter. The injured segment of artery was carefully crossed with the Fathom 16 wire and the renegade STC advanced into the right gastroepiploic artery. Arteriography was performed confirming that the microcatheter is indeed beyond the injured segment of the artery and in the gastroepiploic artery. Coil embolization was then performed first using a 2 x 5 x 5.8 cm vortex interlock detachable coil. Next, a 5 x 15 cm  helical interlock coil and a 6 x 20 cm helical interlock coil were deployed across the injured segment of the artery. Next, a 6 x 6.7 cm vortex push below coil was deployed. This brings is back into the more proximal and larger segment of the gastroduodenal artery. The microcatheter was removed. Final embolization was performed using a 6 x 11 mm Amplatzer vascular plug 4. The 5 French catheter was subsequently brought back into the common hepatic artery and arteriography was performed. There is no further flow within the injured segment of the gastroduodenal artery. Embolization is successful. The catheter was removed. Hemostasis was attained with the assistance of an Angio-Seal device. IMPRESSION: 1. Abnormal and injured segment of the gastroduodenal artery presumably in the region of duodenal ulceration. 2. Successful coil and a VP embolization of the injured gastroduodenal artery. 3. Variant anatomy, the common hepatic artery arises directly from the aorta. Signed, Criselda Peaches, MD, [place] [facility] Radiology Electronically Signed   By: Jacqulynn Cadet M.D.   On: 01/16/2019 08:49   Ir Embo Art  [place] Guide Roadmapping  Result Date: 01/16/2019 INDICATION: 73 year old female with severe peptic ulcer disease resulting in large gastric and duodenal ulcers and subsequent upper GI bleed requiring 6 unit transfusion thus far. Endoscopic therapy is limited given the size of the ulcerations. Therefore, she presents for angiogram and embolization. EXAM: IR ULTRASOUND GUIDANCE VASC ACCESS RIGHT; ADDITIONAL ARTERIOGRAPHY; SELECTIVE VISCERAL ARTERIOGRAPHY; IR EMBO ART VEN HEMORR LYMPH EXTRAV INC GUIDE ROADMAPPING 1. Ultrasound-guided vascular access right common femoral artery 2. Catheterization of the splenic artery with arteriogram 3. Catheterization of the superior mesenteric artery with arteriogram 4. Catheterization of the common hepatic  artery (arising directly from the aorta) with arteriogram 5. Catheterization of the gastroduodenal artery with arteriogram 6. Catheterization of the gastroepiploic artery with arteriogram 7. Coil and  vascular plug embolization of the gastroduodenal artery MEDICATIONS: None ANESTHESIA/SEDATION: Moderate (conscious) sedation was employed during this procedure. A total of Versed 0.5 mg and Fentanyl 25 mcg was administered intravenously. Moderate Sedation Time: 23 minutes. The patient's level of consciousness and vital signs were monitored continuously by radiology nursing throughout the procedure under my direct supervision. CONTRAST:  1mL OMNIPAQUE IOHEXOL 300 MG/ML  SOLN FLUOROSCOPY TIME:  Fluoroscopy Time: 6 minutes 54 seconds (266 mGy). COMPLICATIONS: None immediate. PROCEDURE: Informed consent was obtained from the patient following explanation of the procedure, risks, benefits and alternatives. The patient understands, agrees and consents for the procedure. All questions were addressed. A time out was performed prior to the initiation of the procedure. Maximal barrier sterile technique utilized including caps, mask, sterile gowns, sterile gloves, large sterile drape, hand hygiene, and Betadine prep. The right common femoral artery was interrogated with ultrasound and found to be widely patent. An image was obtained and stored for the medical record. Local anesthesia was attained by infiltration with 1% lidocaine. A small dermatotomy was made. Under real-time sonographic guidance, the vessel was punctured with a 21 gauge micropuncture needle. Using standard technique, the initial micro needle was exchanged over a 0.018 micro wire for a transitional 4 [place] micro sheath. The micro sheath was then exchanged over a 0.035 wire for a 5 French vascular sheath. A C2 cobra catheter was advanced over a Bentson wire into the abdominal aorta. The catheter was used to select the first artery from the aorta. Arteriography  was performed. This is a splenic artery arising independently from the thoracic aorta. The artery is highly tortuous. There is no collateral flow into the hepatic artery. No evidence of aneurysm or bleeding. C2 cobra catheter was used to select another artery arising from the aorta. Arteriography was performed. This is the superior mesenteric artery. Additionally, faint opacification of the right renal artery and a common hepatic artery arising directly from the aorta is notified on reflux. No evidence of replaced right hepatic artery or significant pancreaticoduodenal collaterals. The catheter was next used to select the common hepatic artery which arises directly from the aorta. Arteriography was performed. A large dorsal pancreatic artery is the first branch. The gastroduodenal artery is also visualized and appears slightly abnormal. Utilizing the Bentson wire, the C2 cobra catheter was used to select the gastroduodenal artery. Arteriography was performed. The mid segment of the gastroduodenal artery is highly abnormal with a beaded and irregular appearance. There is no evidence of active extravasation at this time, however the appearance of the artery suggests arterial injury and arterial spasm, likely from the overlying ulceration. This segment of artery is damaged an at high risk for continued bleeding. Therefore, a renegade ST microcatheter and Fathom wire were introduced through the 5 French catheter. The injured segment of artery was carefully crossed with the Fathom 16 wire and the renegade STC advanced into the right gastroepiploic artery. Arteriography was performed confirming that the microcatheter is indeed beyond the injured segment of the artery and in the gastroepiploic artery. Coil embolization was then performed first using a 2 x 5 x 5.8 cm vortex interlock detachable coil. Next, a 5 x 15 cm helical interlock coil and a 6 x 20 cm helical interlock coil were deployed across the injured segment of  the artery. Next, a 6 x 6.7 cm vortex push below coil was deployed. This brings is back into the more proximal and larger segment of the gastroduodenal artery. The microcatheter was removed. Final embolization was  performed using a 6 x 11 mm Amplatzer vascular plug 4. The 5 French catheter was subsequently brought back into the common hepatic artery and arteriography was performed. There is no further flow within the injured segment of the gastroduodenal artery. Embolization is successful. The catheter was removed. Hemostasis was attained with the assistance of an Angio-Seal device. IMPRESSION: 1. Abnormal and injured segment of the gastroduodenal artery presumably in the region of duodenal ulceration. 2. Successful coil and a VP embolization of the injured gastroduodenal artery. 3. Variant anatomy, the common hepatic artery arises directly from the aorta. Signed, Criselda Peaches, MD, [place] [facility] Radiology Electronically Signed   By: Jacqulynn Cadet M.D.   On: 01/16/2019 08:49    Scheduled Meds: . sodium chloride   Intravenous Once  . ARIPiprazole  5 mg Oral Daily  . cholecalciferol  1,000 Units Oral Daily  . donepezil  10 mg Oral QHS  . gabapentin  200 mg Oral BID  . lamoTRIgine  400 mg Oral Daily  . rosuvastatin  40 mg Oral Daily  . sertraline  200 mg Oral Daily  . sodium chloride flush  3 mL Intravenous Q12H  . thiamine  250 mg Oral BID   Continuous Infusions: . cefTRIAXone (ROCEPHIN)  IV Stopped (01/15/19 1826)  . pantoprozole (PROTONIX) infusion 8 mg/hr (01/15/19 2247)  . vancomycin 750 mg (01/15/19 2113)   PRN Meds:.acetaminophen **OR** acetaminophen, clonazePAM, ondansetron **OR** ondansetron (ZOFRAN) IV   ASSESMENT:   *   CGE, dark and burgundy BM's.   01/12/19 EGD: multiple, non-bleeding GUs. Bx'd. Solitary DU w adherent clot, high risk to re bleed.  Explain CT findings of gastric, prox duodenal thickening. rebled in  setting of day 2.5 PPI gtt 06/15/18 EGD: non-bleeding GUs, non-bleeding DU w pigmented material.  No evidence rebleeding 72 hours PPI gtt finished 9/3 at 1430.  Restarted after recurrent hematemesis afternoon 9/3. 01/15/2019 angiography, coil/AVP embolization of GDA.  Dr. Jacqulynn Cadet  Was taking Mobic, full dose aspirin and no PPI or H2 blocker PTA.  *   IDA anemia, ferritin 9.  S/p 7 PRBCs, latest completed 0120 this AM. Feraheme on 9/3.    On oral iron PTA. Hgb 8.4 >> 6.4 >> 1 PRBC>> 7.3.    *   PNA.  Vanc, rocephin in place.  No cough or resp distress.      PLAN   *   Continue PPI drip. ? For another 72 hours?  *   Needs fup EGD with biopsy (Monday 9/7 ?) of duodenal ulcer area to r/o neoplasia.  No previous bx due to wishing to avoid exacerbation of bleeding.    *   Clear liquid diet, advance per rec of Dr Tarri Glenn.    *  H & H  q 5 AM/5PM.      [name]  01/16/2019, 9:27 AM Phone [phone]

## 2019-01-16 NOTE — Care Management Important Message (Signed)
Important Message  Patient Details  Name: Angelica Kelley MRN: [id number] Date of Birth: 01-27-1946   Medicare Important Message Given:  Yes     Memory Argue 01/16/2019, 2:56 PM

## 2019-01-16 NOTE — Progress Notes (Signed)
PROGRESS NOTE  Angelica Kelley [id number] DOB: 1945/05/21 DOA: 01/11/2019 PCP: No primary care provider on file.  Brief History   Patient is a 73 year old Caucasian female with past medical history significant for dementia, TIA, hypertension, hyperlipidemia, diastolic congestive heart failure, atrial fibrillation, alcoholism and iron deficiency anemia.  Patient was admitted following a fall and reported hematemesis and melena stools.  On presentation to the hospital, hemoglobin was documented as 3.5 g/dL.  Last hemoglobin was 10 g/dL.  Other pertinent labs include serum creatinine had gone up from 0.75-1.5, albumin of 1.8, iron of less than 5, ferritin of 9, folate of 6.8 with INR of 1.2.  EGD done earlier today revealed: "Multiple non-bleeding gastric ulcers with no stigmata of bleeding".  Non-bleeding duodenal ulcer with adherent clot. This is at very high risk for re-bleeding". The patient is currently completing 72 hours of IV Protonix. GI will follow up with repeat EGD to reasses and perform biopsies.   The patient has received a total of 6 units of blood with 2 units having been transfused on 01/14/2019. Her hemoglobin this morning following these 2 units is 8.3 after being 6.2 yesterday before transfusion. She received a 7th unit in transfusion on 01/15/2019.   On the afternoon of 01/15/2019 the patient vomited a large quantity of bright red blood. l discussed the patient with Dr. Modena Nunnery who stated that she felt that the duodenal ulcer was likely the source of the bleed and that it was too large for her to successfully intervene endoscopically. She recommended that I consult interventional radiology for embolization. I discussed the patient with Dr. Laurence Ferrari. He took the patient for angiography and embolization. He took the patient for visceral selective angiogram and coil/AVP embolization of the GDA. She has tolerated the procedure well. Hemoglobin is being monitored and may be require further  transfusion.  Consultants  . Gastroenterology . Interventional Radiology  Procedures  . EGD . Angiography and embolization of GDA to stop GI hemorrhage.  Antibiotics   Anti-infectives (From admission, onward)   Start     Dose/Rate Route Frequency Ordered Stop   01/13/19 2000  vancomycin (VANCOCIN) IVPB 750 mg/150 ml premix     750 mg 150 mL/hr over 60 Minutes Intravenous Every 24 hours 01/12/19 1925     01/12/19 1930  vancomycin (VANCOCIN) IVPB 1000 mg/200 mL premix     1,000 mg 200 mL/hr over 60 Minutes Intravenous  Once 01/12/19 1839 01/12/19 2317   01/12/19 1830  cefTRIAXone (ROCEPHIN) 1 g in sodium chloride 0.9 % 100 mL IVPB     1 g 200 mL/hr over 30 Minutes Intravenous Every 24 hours 01/12/19 1816     01/11/19 2200  cefTRIAXone (ROCEPHIN) 1 g in sodium chloride 0.9 % 100 mL IVPB     1 g 200 mL/hr over 30 Minutes Intravenous  Once 01/11/19 2155 01/11/19 2254     Subjective  The patient is resting comfortably. No new complaints.  Objective   Vitals:  Vitals:   01/16/19 0753 01/16/19 1310  BP: (!) 90/47 (!) 113/53  Pulse: 70 68  Resp: 18 18  Temp: 97.7 F (36.5 C) 98.2 F (36.8 C)  SpO2: 100% 100%    Exam:  Constitutional:  . The patient is awake, alert, and oriented x 3. No acute distress. Respiratory:  . No increased work of breathing. . No wheezes, rales, or rhonchi. . No tactile fremitus. Cardiovascular:  . Regular rate and rhythm. . No murmurs, ectopy, or gallups. . No lateral  PMI. No thrill. Abdomen:  . Abdomen is soft, non-tender, non-distended. . No hernias, masses, or organomegaly. . Normoactive bowel sounds.  Musculoskeletal:  . No cyanosis, clubbing, or edema. Skin:  . No rashes, lesions, ulcers . palpation of skin: no induration or nodules Neurologic:  . CN 2-12 intact . Sensation all 4 extremities intact Psychiatric:  . Mental status o Mood, affect appropriate o Orientation to person, place, time  . judgment and insight appear  intact     I have personally reviewed the following:   Today's Data  . Vitals, CBC, hemoglobin/hematorcrit  Imaging  . CXR - multifocal pulmonary opacities.   Scheduled Meds: . sodium chloride   Intravenous Once  . ARIPiprazole  5 mg Oral Daily  . cholecalciferol  1,000 Units Oral Daily  . donepezil  10 mg Oral QHS  . gabapentin  200 mg Oral BID  . lamoTRIgine  400 mg Oral Daily  . rosuvastatin  40 mg Oral Daily  . sertraline  200 mg Oral Daily  . sodium chloride flush  3 mL Intravenous Q12H  . thiamine  250 mg Oral BID   Continuous Infusions: . cefTRIAXone (ROCEPHIN)  IV Stopped (01/15/19 1826)  . pantoprozole (PROTONIX) infusion 8 mg/hr (01/16/19 1203)  . vancomycin 750 mg (01/15/19 2113)    Active Problems:   Acute blood loss anemia   LOS: 5 days   A & P   Acute blood loss anemia 2/2 upper GI bleed (see EGD report above): Continue Protonix drip x 72 hours. Repeat EGD as per demonstrated gastric ulcer and duodenal ulcer. The patient has received a total of 6 units of blood with 2 units having been transfused on 01/14/2019. Her hemoglobin this morning following these 2 units is 8.3 after being 6.2 yesterday before transfusion.  Hold ASA and VTE ppx. I appreciate GI's assistance. They have recommended that we continue to monitor her hemoglobin.  Gastric ulcer and duodenal ulcer: On the afternoon of 01/15/2019 the patient vomited a large quantity of bright red blood. l discussed the patient with Dr. Modena Nunnery who stated that she felt that the duodenal ulcer was likely the source of the bleed and that it was too large for her to successfully intervene endoscopically. She recommended that I consult interventional radiology for embolization. I discussed the patient with Dr. Laurence Ferrari. He took the patient for angiography and embolization. He took the patient for visceral selective angiogram and coil/AVP embolization of the GDA. She has tolerated the procedure well. Hemoglobin is being  monitored and may be require further transfusion. Dr. Modena Nunnery has stated that she intends to take the patient for EGD on Monday to perform biopsies.  Bibasilar groundglass opacities: Atypical pneumonia. Continue Rocephin and Vancomycin. Monitor. Procalcitonin is equivocal at 0.25.  Bipolar disorder: continue home Zoloft, Lamictaland Aricept daily, Klonopin once daily as needed  Overactive bladder: continue home Myrbetriq  HLD: continue home statin  Afib: not on rate control or anticoagulation, continue to monitor  I have seen and examined this patient myself. I have spent 32 minutes in her evaluation and care.  DVT prophylaxis: SCD Code Status: Full code Family Communication:  Disposition Plan: Depend on hospital course  Kalil Woessner, DO [facility] Direct contact: see [email or web address]  7PM-7AM contact night coverage as above 01/16/2019, 3:13 PM  LOS: 2 days

## 2019-01-17 LAB — CBC WITH DIFFERENTIAL/PLATELET
Abs Immature Granulocytes: 0.23 10*3/uL — ABNORMAL HIGH (ref 0.00–0.07)
Basophils Absolute: 0 10*3/uL (ref 0.0–0.1)
Basophils Relative: 1 %
Eosinophils Absolute: 0.3 10*3/uL (ref 0.0–0.5)
Eosinophils Relative: 4 %
HCT: 20.8 % — ABNORMAL LOW (ref 36.0–46.0)
Hemoglobin: 6.6 g/dL — CL (ref 12.0–15.0)
Immature Granulocytes: 3 %
Lymphocytes Relative: 13 %
Lymphs Abs: 0.9 10*3/uL (ref 0.7–4.0)
MCH: 28.3 pg (ref 26.0–34.0)
MCHC: 31.7 g/dL (ref 30.0–36.0)
MCV: 89.3 fL (ref 80.0–100.0)
Monocytes Absolute: 0.5 10*3/uL (ref 0.1–1.0)
Monocytes Relative: 7 %
Neutro Abs: 5.1 10*3/uL (ref 1.7–7.7)
Neutrophils Relative %: 72 %
Platelets: 205 10*3/uL (ref 150–400)
RBC: 2.33 MIL/uL — ABNORMAL LOW (ref 3.87–5.11)
RDW: 15.9 % — ABNORMAL HIGH (ref 11.5–15.5)
WBC: 7.1 10*3/uL (ref 4.0–10.5)
nRBC: 0 % (ref 0.0–0.2)

## 2019-01-17 LAB — BASIC METABOLIC PANEL
Anion gap: 8 (ref 5–15)
BUN: 15 mg/dL (ref 8–23)
CO2: 23 mmol/L (ref 22–32)
Calcium: 7.6 mg/dL — ABNORMAL LOW (ref 8.9–10.3)
Chloride: 110 mmol/L (ref 98–111)
Creatinine, Ser: 0.86 mg/dL (ref 0.44–1.00)
GFR calc Af Amer: >60 mL/min (ref >60)
GFR calc non Af Amer: >60 mL/min (ref >60)
Glucose, Bld: 100 mg/dL — ABNORMAL HIGH (ref 70–99)
Potassium: 3.2 mmol/L — ABNORMAL LOW (ref 3.5–5.1)
Sodium: 141 mmol/L (ref 135–145)

## 2019-01-17 LAB — PREPARE RBC (CROSSMATCH)

## 2019-01-17 LAB — GLUCOSE, CAPILLARY: Glucose-Capillary: 87 mg/dL (ref 70–99)

## 2019-01-17 LAB — HEMOGLOBIN AND HEMATOCRIT, BLOOD
HCT: 27.1 % — ABNORMAL LOW (ref 36.0–46.0)
Hemoglobin: 8.8 g/dL — ABNORMAL LOW (ref 12.0–15.0)

## 2019-01-17 MED ORDER — SODIUM CHLORIDE 0.9% IV SOLUTION
Freq: Once | INTRAVENOUS | Status: AC
  Administered 2019-01-17: 11:00:00 via INTRAVENOUS

## 2019-01-17 NOTE — Progress Notes (Signed)
Critical Lab Value: Hemoglobin 6.6  Ava Swayze DO notified of critical lab value Hemoglobin 6.6

## 2019-01-17 NOTE — Progress Notes (Signed)
PROGRESS NOTE  Angelica Kelley [id number] DOB: [birth year] DOA: 01/11/2019 PCP: No primary care provider on file.  Brief History   Patient is a 73 year old Caucasian female with past medical history significant for dementia, TIA, hypertension, hyperlipidemia, diastolic congestive heart failure, atrial fibrillation, alcoholism and iron deficiency anemia.  Patient was admitted following a fall and reported hematemesis and melena stools.  On presentation to the hospital, hemoglobin was documented as 3.5 g/dL.  Last hemoglobin was 10 g/dL.  Other pertinent labs include serum creatinine had gone up from 0.75-1.5, albumin of 1.8, iron of less than 5, ferritin of 9, folate of 6.8 with INR of 1.2.  EGD done earlier today revealed: "Multiple non-bleeding gastric ulcers with no stigmata of bleeding".  Non-bleeding duodenal ulcer with adherent clot. This is at very high risk for re-bleeding". The patient is currently completing 72 hours of IV Protonix. GI will follow up with repeat EGD to reasses and perform biopsies.   The patient has received a total of 6 units of blood with 2 units having been transfused on 01/14/2019. Her hemoglobin this morning following these 2 units is 8.3 after being 6.2 yesterday before transfusion. She received a 7th unit in transfusion on 01/15/2019.   On the afternoon of 01/15/2019 the patient vomited a large quantity of bright red blood. l discussed the patient with Dr. Modena Nunnery who stated that she felt that the duodenal ulcer was likely the source of the bleed and that it was too large for her to successfully intervene endoscopically. She recommended that I consult interventional radiology for embolization. I discussed the patient with Dr. Laurence Ferrari. He took the patient for angiography and embolization. He took the patient for visceral selective angiogram and coil/AVP embolization of the GDA. She has tolerated the procedure well. Hemoglobin is being monitored and may be require further  transfusion.  This morning the patient's hemoglobin was found to be 6.5 down from 8.3 yesterday. She will remain on the protonix drip, and she is being transfused with her eighth unit of blood. Plan is for repeat EGD on 01/19/2019.  Consultants  . Gastroenterology . Interventional Radiology  Procedures  . EGD . Angiography and embolization of GDA to stop GI hemorrhage. . Transfusion of 8 units of Hemoglobin  Antibiotics   Anti-infectives (From admission, onward)   Start     Dose/Rate Route Frequency Ordered Stop   01/13/19 2000  vancomycin (VANCOCIN) IVPB 750 mg/150 ml premix  Status:  Discontinued     750 mg 150 mL/hr over 60 Minutes Intravenous Every 24 hours 01/12/19 1925 01/16/19 1604   01/12/19 1930  vancomycin (VANCOCIN) IVPB 1000 mg/200 mL premix     1,000 mg 200 mL/hr over 60 Minutes Intravenous  Once 01/12/19 1839 01/12/19 2317   01/12/19 1830  cefTRIAXone (ROCEPHIN) 1 g in sodium chloride 0.9 % 100 mL IVPB  Status:  Discontinued     1 g 200 mL/hr over 30 Minutes Intravenous Every 24 hours 01/12/19 1816 01/17/19 1038   01/11/19 2200  cefTRIAXone (ROCEPHIN) 1 g in sodium chloride 0.9 % 100 mL IVPB     1 g 200 mL/hr over 30 Minutes Intravenous  Once 01/11/19 2155 01/11/19 2254     Subjective  The patient is resting comfortably. No new complaints.  Objective   Vitals:  Vitals:   01/17/19 1230 01/17/19 1400  BP: (!) 97/50 (!) 131/49  Pulse: 65 68  Resp: 15 15  Temp:  98 F (36.7 C)  SpO2: 97% 100%  Exam:  Constitutional:  . The patient is awake, alert, and oriented x 3. No acute distress. Respiratory:  . No increased work of breathing. . No wheezes, rales, or rhonchi. . No tactile fremitus. Cardiovascular:  . Regular rate and rhythm. . No murmurs, ectopy, or gallups. . No lateral PMI. No thrill. Abdomen:  . Abdomen is soft, non-tender, non-distended. . No hernias, masses, or organomegaly. . Normoactive bowel sounds.  Musculoskeletal:  . No  cyanosis, clubbing, or edema. Skin:  . No rashes, lesions, ulcers . palpation of skin: no induration or nodules Neurologic:  . CN 2-12 intact . Sensation all 4 extremities intact Psychiatric:  . Mental status o Mood, affect appropriate o Orientation to person, place, time  . judgment and insight appear intact     I have personally reviewed the following:   Today's Data  . Vitals, CBC, hemoglobin/hematorcrit  Imaging  . CXR - multifocal pulmonary opacities.   Scheduled Meds: . sodium chloride   Intravenous Once  . ARIPiprazole  5 mg Oral Daily  . cholecalciferol  1,000 Units Oral Daily  . donepezil  10 mg Oral QHS  . gabapentin  200 mg Oral BID  . lamoTRIgine  400 mg Oral Daily  . rosuvastatin  40 mg Oral Daily  . sertraline  200 mg Oral Daily  . sodium chloride flush  3 mL Intravenous Q12H  . thiamine  250 mg Oral BID   Continuous Infusions: . pantoprozole (PROTONIX) infusion 8 mg/hr (01/17/19 1041)    Active Problems:   Acute blood loss anemia   LOS: 6 days   A & P   Acute blood loss anemia 2/2 upper GI bleed (see EGD report above): Continue Protonix drip x 72 hours. Repeat EGD as per demonstrated gastric ulcer and duodenal ulcer. The patient has received a total of 6 units of blood with 2 units having been transfused on 01/14/2019. Her hemoglobin this morning following these 2 units is 8.3 after being 6.2 yesterday before transfusion.  Hold ASA and VTE ppx. I appreciate GI's assistance. This morning the patient's hemoglobin was found to be 6.5 down from 8.3 yesterday. She will remain on the protonix drip, and she is being transfused with her eighth unit of blood. Plan is for repeat EGD on 01/19/2019.  Gastric ulcer and duodenal ulcer: On the afternoon of 01/15/2019 the patient vomited a large quantity of bright red blood. l discussed the patient with Dr. Modena Nunnery who stated that she felt that the duodenal ulcer was likely the source of the bleed and that it was too large for  her to successfully intervene endoscopically. She recommended that I consult interventional radiology for embolization. I discussed the patient with Dr. Laurence Ferrari. He took the patient for angiography and embolization. He took the patient for visceral selective angiogram and coil/AVP embolization of the GDA. She has tolerated the procedure well. Hemoglobin is being monitored and may be require further transfusion. Dr. Modena Nunnery has stated that she intends to take the patient for EGD on Monday to perform biopsies.  Bibasilar groundglass opacities: Atypical pneumonia. Continue Rocephin and Vancomycin. Monitor. Procalcitonin is equivocal at 0.25.  Bipolar disorder: continue home Zoloft, Lamictaland Aricept daily, Klonopin once daily as needed.  Overactive bladder: continue home Myrbetriq  HLD: continue home statin  Afib: not on rate control or anticoagulation, continue to monitor  I have seen and examined this patient myself. I have spent 35 minutes in her evaluation and care.  DVT prophylaxis: SCD Code Status: Full code Family  Communication:  Disposition Plan: Depend on hospital course  Maripaz Mullan, DO [facility] Direct contact: see [email or web address]  7PM-7AM contact night coverage as above 01/17/2019, 3:54 PM  LOS: 2 days

## 2019-01-17 NOTE — Progress Notes (Signed)
    Progress Note   Subjective  No GI complaints this morning. No appetite.  Nurse present at the bedside and reports no bowel movements overnight.    Objective  Vital signs in last 24 hours: Temp:  [97.6 F (36.4 C)-98.2 F (36.8 C)] 97.7 F (36.5 C) (09/05 1030) Pulse Rate:  [65-68] 66 (09/05 0700) Resp:  [11-31] 16 (09/05 1030) BP: (100-129)/(42-68) 119/50 (09/05 1030) SpO2:  [99 %-100 %] 100 % (09/05 1030) Weight:  [63.8 kg] 63.8 kg (09/05 0423) Last BM Date: 01/14/19  General: Alert, well-developed, in NAD, slightly pale this morning Heart:  Regular rate and rhythm; no murmurs Chest: Clear to ascultation bilaterally Abdomen:  Soft, nontender and nondistended. Normal bowel sounds, without guarding, and without rebound.   Extremities:  Without edema. Neurologic:  Alert and  oriented x4; grossly normal neurologically. Psych:  Alert and cooperative. Normal mood and affect.  Intake/Output from previous day: 09/04 0701 - 09/05 0700 In: 1353.5 [P.O.:600; I.V.:653.5; IV Piggyback:100] Out: 200 [Urine:200] Intake/Output this shift: No intake/output data recorded.  Lab Results: Recent Labs    01/15/19 0306  01/16/19 0317 01/16/19 1049 01/16/19 1652 01/17/19 0641  WBC 8.4  --  8.2  --   --  7.1  HGB 8.3*   < > 7.3* 7.7* 7.1* 6.6*  HCT 25.7*   < > 22.5* 23.6* 22.0* 20.8*  PLT 226  --  213  --   --  205   < > = values in this interval not displayed.   BMET Recent Labs    01/16/19 0317 01/17/19 0641  NA 139 141  K 3.8 3.2*  CL 110 110  CO2 23 23  GLUCOSE 99 100*  BUN 22 15  CREATININE 0.83 0.86  CALCIUM 7.2* 7.6*       Assessment & Recommendations  Peptic ulcer disease with multiple small gastric ulcers and recurrent bleeding from a large DU s/p GDA embolization 01/15/19. No overt bleeding following embolization. No ongoing GI symptoms.  Hgb 7.7->7.1->6.6 BUN improving from 22->12   Progressive anemia may be re-equilibration from the large bleed prior to  embolization. Continue IV PPI. Clear liquid diet today (no reds). Continue serial hgb/hct with transfusion as necessary. Will plan EGD 01/17/19 to reassess the duodenum given the irregular margins of the duodenal ulcer and concern for possible neoplasm.      LOS: 6 days   Thornton Park  01/17/2019, 11:53 AM

## 2019-01-18 LAB — TYPE AND SCREEN
ABO/RH(D): A POS
Antibody Screen: NEGATIVE
Unit division: 0
Unit division: 0

## 2019-01-18 LAB — HEMOGLOBIN AND HEMATOCRIT, BLOOD
HCT: 26.8 % — ABNORMAL LOW (ref 36.0–46.0)
Hemoglobin: 8.5 g/dL — ABNORMAL LOW (ref 12.0–15.0)

## 2019-01-18 LAB — GLUCOSE, CAPILLARY
Glucose-Capillary: 89 mg/dL (ref 70–99)
Glucose-Capillary: 90 mg/dL (ref 70–99)

## 2019-01-18 LAB — CBC WITH DIFFERENTIAL/PLATELET
Abs Immature Granulocytes: 0.2 10*3/uL — ABNORMAL HIGH (ref 0.00–0.07)
Basophils Absolute: 0.1 10*3/uL (ref 0.0–0.1)
Basophils Relative: 1 %
Eosinophils Absolute: 0.3 10*3/uL (ref 0.0–0.5)
Eosinophils Relative: 5 %
HCT: 24.7 % — ABNORMAL LOW (ref 36.0–46.0)
Hemoglobin: 8.1 g/dL — ABNORMAL LOW (ref 12.0–15.0)
Immature Granulocytes: 3 %
Lymphocytes Relative: 15 %
Lymphs Abs: 1.1 10*3/uL (ref 0.7–4.0)
MCH: 29.3 pg (ref 26.0–34.0)
MCHC: 32.8 g/dL (ref 30.0–36.0)
MCV: 89.5 fL (ref 80.0–100.0)
Monocytes Absolute: 0.7 10*3/uL (ref 0.1–1.0)
Monocytes Relative: 9 %
Neutro Abs: 4.8 10*3/uL (ref 1.7–7.7)
Neutrophils Relative %: 67 %
Platelets: 216 10*3/uL (ref 150–400)
RBC: 2.76 MIL/uL — ABNORMAL LOW (ref 3.87–5.11)
RDW: 15.5 % (ref 11.5–15.5)
WBC: 7.2 10*3/uL (ref 4.0–10.5)
nRBC: 0 % (ref 0.0–0.2)

## 2019-01-18 LAB — BPAM RBC
Blood Product Expiration Date: 202009302359
Blood Product Expiration Date: 202009302359
ISSUE DATE / TIME: 202009032333
ISSUE DATE / TIME: 202009051012
Unit Type and Rh: 6200
Unit Type and Rh: 6200

## 2019-01-18 MED ORDER — SODIUM CHLORIDE 0.9 % IV SOLN: INTRAVENOUS | Status: DC

## 2019-01-18 NOTE — H&P (View-Only) (Signed)
    Progress Note   Subjective  Somewhat confused this morning. Denies any GI complaints this morning. No family present.    Objective  Vital signs in last 24 hours: Temp:  [97.6 F (36.4 C)-98.1 F (36.7 C)] 98 F (36.7 C) (09/06 0803) Pulse Rate:  [42-130] 65 (09/06 0803) Resp:  [15-22] 15 (09/06 0803) BP: (97-139)/(46-125) 130/47 (09/06 0803) SpO2:  [49 %-100 %] 100 % (09/06 0803) Weight:  [64.1 kg] 64.1 kg (09/06 0426) Last BM Date: 01/14/19  General: Alert, well-developed, in NAD, slightly pale this morning Heart:  Regular rate and rhythm; no murmurs Chest: Clear to ascultation bilaterally Abdomen:  Soft, nontender and nondistended. Normal bowel sounds. No guarding and no rebound.   Extremities:  Without edema. Psych:  Alert and cooperative. Normal mood and affect.  Intake/Output from previous day: 09/05 0701 - 09/06 0700 In: 1234.1 [P.O.:320; I.V.:368.9; Blood:545.3] Out: 250 [Urine:250] Intake/Output this shift: No intake/output data recorded.  Lab Results: Recent Labs    01/16/19 0317  01/17/19 0641 01/17/19 1628 01/18/19 0435  WBC 8.2  --  7.1  --  7.2  HGB 7.3*   < > 6.6* 8.8* 8.1*  HCT 22.5*   < > 20.8* 27.1* 24.7*  PLT 213  --  205  --  216   < > = values in this interval not displayed.   BMET Recent Labs    01/16/19 0317 01/17/19 0641  NA 139 141  K 3.8 3.2*  CL 110 110  CO2 23 23  GLUCOSE 99 100*  BUN 22 15  CREATININE 0.83 0.86  CALCIUM 7.2* 7.6*       Assessment & Recommendations  Peptic ulcer disease with multiple small gastric ulcers and recurrent bleeding from a large DU s/p GDA embolization 01/15/19. She has required a total of 8 units of PRBCs.  No overt bleeding following embolization. No ongoing GI symptoms.  Hgb 6.6->1 units of PRNCs->8.8->8.1 BUN improving from 22->15  Continue IV PPI. Clear liquid diet today (no reds). Continue serial hgb/hct with transfusion as necessary. EGD 01/19/19 to reassess the duodenum given the  irregular margins of the duodenal ulcer and concern for possible neoplasm.      LOS: 7 days   Thornton Park  01/18/2019, 8:41 AM

## 2019-01-18 NOTE — Progress Notes (Signed)
Pt woke confused and rushed to the bedside commode, pulled IV and hear monitor leads out. RN reorient pt and assist her back to bed. We'll continue to monitor.

## 2019-01-18 NOTE — Progress Notes (Addendum)
    Progress Note   Subjective  Somewhat confused this morning. Denies any GI complaints this morning. No family present.    Objective  Vital signs in last 24 hours: Temp:  [97.6 F (36.4 C)-98.1 F (36.7 C)] 98 F (36.7 C) (09/06 0803) Pulse Rate:  [42-130] 65 (09/06 0803) Resp:  [15-22] 15 (09/06 0803) BP: (97-139)/(46-125) 130/47 (09/06 0803) SpO2:  [49 %-100 %] 100 % (09/06 0803) Weight:  [64.1 kg] 64.1 kg (09/06 0426) Last BM Date: 01/14/19  General: Alert, well-developed, in NAD, slightly pale this morning Heart:  Regular rate and rhythm; no murmurs Chest: Clear to ascultation bilaterally Abdomen:  Soft, nontender and nondistended. Normal bowel sounds. No guarding and no rebound.   Extremities:  Without edema. Psych:  Alert and cooperative. Normal mood and affect.  Intake/Output from previous day: 09/05 0701 - 09/06 0700 In: 1234.1 [P.O.:320; I.V.:368.9; Blood:545.3] Out: 250 [Urine:250] Intake/Output this shift: No intake/output data recorded.  Lab Results: Recent Labs    01/16/19 0317  01/17/19 0641 01/17/19 1628 01/18/19 0435  WBC 8.2  --  7.1  --  7.2  HGB 7.3*   < > 6.6* 8.8* 8.1*  HCT 22.5*   < > 20.8* 27.1* 24.7*  PLT 213  --  205  --  216   < > = values in this interval not displayed.   BMET Recent Labs    01/16/19 0317 01/17/19 0641  NA 139 141  K 3.8 3.2*  CL 110 110  CO2 23 23  GLUCOSE 99 100*  BUN 22 15  CREATININE 0.83 0.86  CALCIUM 7.2* 7.6*       Assessment & Recommendations  Peptic ulcer disease with multiple small gastric ulcers and recurrent bleeding from a large DU s/p GDA embolization 01/15/19. She has required a total of 8 units of PRBCs.  No overt bleeding following embolization. No ongoing GI symptoms. Gastric biopsies were negative for H pylori.    Hgb 6.6->1 units of PRNCs->8.8->8.1 BUN improving from 22->15  Continue IV PPI. Clear liquid diet today (no reds). Continue serial hgb/hct with transfusion as necessary. EGD  01/19/19 to reassess the duodenum given the irregular margins of the duodenal ulcer and concern for possible neoplasm. Avoid all NSAIDs. Mrs. Cardile was using full dose ASA and Mobic prior to admission.       LOS: 7 days   Thornton Park  01/18/2019, 8:41 AM

## 2019-01-18 NOTE — Progress Notes (Signed)
PROGRESS NOTE  Angelica Kelley [id number] DOB: 12/21/1945 DOA: 01/11/2019 PCP: No primary care provider on file.  Brief History   Patient is a 73 year old Caucasian female with past medical history significant for dementia, TIA, hypertension, hyperlipidemia, diastolic congestive heart failure, atrial fibrillation, alcoholism and iron deficiency anemia.  Patient was admitted following a fall and reported hematemesis and melena stools.  On presentation to the hospital, hemoglobin was documented as 3.5 g/dL.  Last hemoglobin was 10 g/dL.  Other pertinent labs include serum creatinine had gone up from 0.75-1.5, albumin of 1.8, iron of less than 5, ferritin of 9, folate of 6.8 with INR of 1.2.  EGD done earlier today revealed: "Multiple non-bleeding gastric ulcers with no stigmata of bleeding".  Non-bleeding duodenal ulcer with adherent clot. This is at very high risk for re-bleeding". The patient is currently completing 72 hours of IV Protonix. GI will follow up with repeat EGD to reasses and perform biopsies.   The patient has received a total of 6 units of blood with 2 units having been transfused on 01/14/2019. Her hemoglobin this morning following these 2 units is 8.3 after being 6.2 yesterday before transfusion. She received a 7th unit in transfusion on 01/15/2019.   On the afternoon of 01/15/2019 the patient vomited a large quantity of bright red blood. l discussed the patient with Dr. Modena Nunnery who stated that she felt that the duodenal ulcer was likely the source of the bleed and that it was too large for her to successfully intervene endoscopically. She recommended that I consult interventional radiology for embolization. I discussed the patient with Dr. Laurence Ferrari. He took the patient for angiography and embolization. He took the patient for visceral selective angiogram and coil/AVP embolization of the GDA. She has tolerated the procedure well. Hemoglobin is being monitored and may be require further  transfusion.  This morning the patient's hemoglobin was found to be 6.5 down from 8.3 yesterday. The patient has required 8 units of blood intransfusion since admission. After the 01/17/2019 transfusion, he hemoglobin increased to 8.8, then dropped to 8.1 this morning. She will remain on the protonix drip. Plan is for repeat EGD on 01/19/2019.  Consultants  . Gastroenterology . Interventional Radiology  Procedures  . EGD . Angiography and embolization of GDA to stop GI hemorrhage. . Transfusion of 8 units of Hemoglobin  Antibiotics   Anti-infectives (From admission, onward)   Start     Dose/Rate Route Frequency Ordered Stop   01/13/19 2000  vancomycin (VANCOCIN) IVPB 750 mg/150 ml premix  Status:  Discontinued     750 mg 150 mL/hr over 60 Minutes Intravenous Every 24 hours 01/12/19 1925 01/16/19 1604   01/12/19 1930  vancomycin (VANCOCIN) IVPB 1000 mg/200 mL premix     1,000 mg 200 mL/hr over 60 Minutes Intravenous  Once 01/12/19 1839 01/12/19 2317   01/12/19 1830  cefTRIAXone (ROCEPHIN) 1 g in sodium chloride 0.9 % 100 mL IVPB  Status:  Discontinued     1 g 200 mL/hr over 30 Minutes Intravenous Every 24 hours 01/12/19 1816 01/17/19 1038   01/11/19 2200  cefTRIAXone (ROCEPHIN) 1 g in sodium chloride 0.9 % 100 mL IVPB     1 g 200 mL/hr over 30 Minutes Intravenous  Once 01/11/19 2155 01/11/19 2254     Subjective  The patient is resting comfortably. No new complaints.  Objective   Vitals:  Vitals:   01/18/19 0426 01/18/19 0803  BP: (!) 125/56 (!) 130/47  Pulse: 64 65  Resp:  16 15  Temp: 97.6 F (36.4 C) 98 F (36.7 C)  SpO2: 99% 100%    Exam:  Constitutional:  . The patient is awake, alert, and oriented x 3. No acute distress. Respiratory:  . No increased work of breathing. . No wheezes, rales, or rhonchi. . No tactile fremitus. Cardiovascular:  . Regular rate and rhythm. . No murmurs, ectopy, or gallups. . No lateral PMI. No thrill. Abdomen:  . Abdomen is soft,  non-tender, non-distended. . No hernias, masses, or organomegaly. . Normoactive bowel sounds.  Musculoskeletal:  . No cyanosis, clubbing, or edema. Skin:  . No rashes, lesions, ulcers . palpation of skin: no induration or nodules Neurologic:  . CN 2-12 intact . Sensation all 4 extremities intact Psychiatric:  . Mental status o Mood, affect appropriate o Orientation to person, place, time  . judgment and insight appear intact     I have personally reviewed the following:   Today's Data  . Vitals, CBC, hemoglobin/hematorcrit  Imaging  . CXR - multifocal pulmonary opacities.   Scheduled Meds: . sodium chloride   Intravenous Once  . ARIPiprazole  5 mg Oral Daily  . cholecalciferol  1,000 Units Oral Daily  . donepezil  10 mg Oral QHS  . gabapentin  200 mg Oral BID  . lamoTRIgine  400 mg Oral Daily  . rosuvastatin  40 mg Oral Daily  . sertraline  200 mg Oral Daily  . sodium chloride flush  3 mL Intravenous Q12H  . thiamine  250 mg Oral BID   Continuous Infusions: . pantoprozole (PROTONIX) infusion 8 mg/hr (01/18/19 1232)    Active Problems:   Acute blood loss anemia   LOS: 7 days   A & P   Acute blood loss anemia 2/2 upper GI bleed (see EGD report above): Continue Protonix drip x 72 hours. Repeat EGD as per demonstrated gastric ulcer and duodenal ulcer. The patient has received a total of 6 units of blood with 2 units having been transfused on 01/14/2019. Her hemoglobin this morning following these 2 units is 8.3 after being 6.2 yesterday before transfusion.  Hold ASA and VTE ppx. I appreciate GI's assistance. This morning the patient's hemoglobin was found to be 6.5 down from 8.3 yesterday. She will remain on the protonix drip, and she is being transfused with her eighth unit of blood. However her hemoglobin proceeded to drop from 8.8 on 01/17/2019 to 8.1 this morning. Will continue to monitor. Plan is for repeat EGD on 01/19/2019.  Gastric ulcer and duodenal ulcer: On the  afternoon of 01/15/2019 the patient vomited a large quantity of bright red blood. l discussed the patient with Dr. Modena Nunnery who stated that she felt that the duodenal ulcer was likely the source of the bleed and that it was too large for her to successfully intervene endoscopically. She recommended that I consult interventional radiology for embolization. I discussed the patient with Dr. Laurence Ferrari. He took the patient for angiography and embolization. He took the patient for visceral selective angiogram and coil/AVP embolization of the GDA. She has tolerated the procedure well. Hemoglobin is being monitored and may be require further transfusion. Dr. Modena Nunnery has stated that she intends to take the patient for EGD on Monday to perform biopsies.  Bibasilar groundglass opacities: Atypical pneumonia. Continue Rocephin and Vancomycin. Monitor. Procalcitonin is equivocal at 0.25.  Bipolar disorder: continue home Zoloft, Lamictaland Aricept daily, Klonopin once daily as needed.  Overactive bladder: continue home Myrbetriq  HLD: continue home statin  Afib:  not on rate control or anticoagulation, continue to monitor  I have seen and examined this patient myself. I have spent 30 minutes in her evaluation and care.  DVT prophylaxis: SCD Code Status: Full code Family Communication:  Disposition Plan: Depend on hospital course  Deashia Soule, DO [facility] Direct contact: see [email or web address]  7PM-7AM contact night coverage as above 01/18/2019, 2:28 PM  LOS: 2 days

## 2019-01-19 ENCOUNTER — Inpatient Hospital Stay (HOSPITAL_COMMUNITY): Payer: Medicare Other | Admitting: Certified Registered Nurse Anesthetist

## 2019-01-19 ENCOUNTER — Encounter (HOSPITAL_COMMUNITY)
Admission: EM | Disposition: A | Discharge status: Home / Self Care | Payer: Self-pay | Source: Home / Self Care | Attending: Internal Medicine

## 2019-01-19 ENCOUNTER — Encounter (HOSPITAL_COMMUNITY): Payer: Self-pay | Admitting: Certified Registered Nurse Anesthetist

## 2019-01-19 DIAGNOSIS — K269 Duodenal ulcer, unspecified as acute or chronic, without hemorrhage or perforation: Secondary | ICD-10-CM

## 2019-01-19 HISTORY — PX: ESOPHAGOGASTRODUODENOSCOPY (EGD) WITH PROPOFOL: SHX5813

## 2019-01-19 HISTORY — PX: BIOPSY: SHX5522

## 2019-01-19 LAB — CBC WITH DIFFERENTIAL/PLATELET
Abs Immature Granulocytes: 0.09 10*3/uL — ABNORMAL HIGH (ref 0.00–0.07)
Basophils Absolute: 0 10*3/uL (ref 0.0–0.1)
Basophils Relative: 1 %
Eosinophils Absolute: 0.3 10*3/uL (ref 0.0–0.5)
Eosinophils Relative: 4 %
HCT: 25.1 % — ABNORMAL LOW (ref 36.0–46.0)
Hemoglobin: 8.1 g/dL — ABNORMAL LOW (ref 12.0–15.0)
Immature Granulocytes: 2 %
Lymphocytes Relative: 18 %
Lymphs Abs: 1.1 10*3/uL (ref 0.7–4.0)
MCH: 29 pg (ref 26.0–34.0)
MCHC: 32.3 g/dL (ref 30.0–36.0)
MCV: 90 fL (ref 80.0–100.0)
Monocytes Absolute: 0.5 10*3/uL (ref 0.1–1.0)
Monocytes Relative: 9 %
Neutro Abs: 4 10*3/uL (ref 1.7–7.7)
Neutrophils Relative %: 66 %
Platelets: 227 10*3/uL (ref 150–400)
RBC: 2.79 MIL/uL — ABNORMAL LOW (ref 3.87–5.11)
RDW: 15.5 % (ref 11.5–15.5)
WBC: 6 10*3/uL (ref 4.0–10.5)
nRBC: 0 % (ref 0.0–0.2)

## 2019-01-19 LAB — HEMOGLOBIN AND HEMATOCRIT, BLOOD
HCT: 27.9 % — ABNORMAL LOW (ref 36.0–46.0)
HCT: 24.9 % — ABNORMAL LOW (ref 36.0–46.0)
Hemoglobin: 8.8 g/dL — ABNORMAL LOW (ref 12.0–15.0)
Hemoglobin: 8.2 g/dL — ABNORMAL LOW (ref 12.0–15.0)

## 2019-01-19 LAB — GLUCOSE, CAPILLARY: Glucose-Capillary: 92 mg/dL (ref 70–99)

## 2019-01-19 SURGERY — ESOPHAGOGASTRODUODENOSCOPY (EGD) WITH PROPOFOL
Anesthesia: Monitor Anesthesia Care

## 2019-01-19 MED ORDER — PROPOFOL 500 MG/50ML IV EMUL
INTRAVENOUS | Status: DC | PRN
  Administered 2019-01-19: 100 ug/kg/min via INTRAVENOUS

## 2019-01-19 MED ORDER — LIDOCAINE 2% (20 MG/ML) 5 ML SYRINGE
INTRAMUSCULAR | Status: DC | PRN
  Administered 2019-01-19: 50 mg via INTRAVENOUS

## 2019-01-19 MED ORDER — LACTATED RINGERS IV SOLN
INTRAVENOUS | Status: DC
  Administered 2019-01-19: 08:00:00 via INTRAVENOUS

## 2019-01-19 MED ORDER — PANTOPRAZOLE SODIUM 40 MG IV SOLR
40.0000 mg | Freq: Two times a day (BID) | INTRAVENOUS | Status: DC
  Administered 2019-01-19 – 2019-01-20 (×3): 40 mg via INTRAVENOUS
  Filled 2019-01-19 (×3): qty 40

## 2019-01-19 SURGICAL SUPPLY — 15 items

## 2019-01-19 NOTE — Transfer of Care (Signed)
Immediate Anesthesia Transfer of Care Note  Patient: Angelica Kelley  Procedure(s) Performed: ESOPHAGOGASTRODUODENOSCOPY (EGD) WITH PROPOFOL (N/A )  Patient Location: PACU and Endoscopy Unit  Anesthesia Type:MAC  Level of Consciousness: awake, patient cooperative and responds to stimulation  Airway & Oxygen Therapy: Patient Spontanous Breathing  Post-op Assessment: Report given to RN and Post -op Vital signs reviewed and stable  Post vital signs: Reviewed and stable  Last Vitals:  Vitals Value Taken Time  BP 143/104 01/19/19 0757  Temp    Pulse    Resp 19 01/19/19 0757  SpO2    Vitals shown include unvalidated device data.  Last Pain:  Vitals:   01/19/19 0708  TempSrc: Oral  PainSc: 0-No pain      Patients Stated Pain Goal: 2 (03/88/82 8003)  Complications: No apparent anesthesia complications

## 2019-01-19 NOTE — Op Note (Signed)
[facility] Patient Name: Angelica Kelley Procedure Date : 01/19/2019 MRN: [id number] Attending MD: Thornton Park MD, MD Date of Birth: 03/18/1946 CSN: [id number] Age: 73 Admit Type: Inpatient Procedure:                Upper GI endoscopy Indications:              Follow-up of acute duodenal ulcer, Follow-up of                            acute duodenal ulcer with hemorrhage                           Recent GI bleed due to large NSAID-related DU s/p                            GDA embolization 01/15/19. At least 15 gastric ulcers                            also seen, but without stigmata of bleeding.                           Has required 8 units of PRBCs.                           Gastric biopsies were previously negative for H                            pylori.                           Using full dose ASA and daily Mobic prior to                            admission.                           Follow-up EGD recommended today to reevaluate the                            duodenal ulcer margins, which appeared heaped on                            prior endoscopy. Providers:                Thornton Park MD, MD, Angus Seller, Laverda Sorenson, Technician, Judeth Cornfield, CRNA Referring MD:              Medicines:                See the Anesthesia note for documentation of the                            administered medications Complications:            No immediate complications. Estimated blood loss:  Minimal. Estimated Blood Loss:     Estimated blood loss was minimal. Procedure:                Pre-Anesthesia Assessment:                           - Prior to the procedure, a History and Physical                            was performed, and patient medications and                            allergies were reviewed. The patient's tolerance of                            previous anesthesia was also reviewed. The risks                      and benefits of the procedure and the sedation                            options and risks were discussed with the patient.                            All questions were answered, and informed consent                            was obtained. Prior Anticoagulants: The patient has                            taken no previous anticoagulant or antiplatelet                            agents. ASA Grade Assessment: III - A patient with                            severe systemic disease. After reviewing the risks                            and benefits, the patient was deemed in                            satisfactory condition to undergo the procedure.                           After obtaining informed consent, the endoscope was                            passed under direct vision. Throughout the                            procedure, the patient's blood pressure, pulse, and                            oxygen saturations were monitored  continuously. The                            [id number] Olympus gastroscope was                            introduced through the mouth, and advanced to the                            second part of duodenum. The upper GI endoscopy was                            accomplished without difficulty. The patient                            tolerated the procedure well. Scope In: Scope Out: Findings:      The esophagus was normal.      Many (>10) non-bleeding cratered gastric ulcers with no stigmata of       bleeding were found in the gastric body and in the gastric antrum. The       largest lesion was 4 mm in largest dimension. Scattered erosions were       also present. No blood present.      One non-obstructing non-bleeding cratered duodenal ulcer with pigmented       material was found consuming almost the entire duodenal bulb. The       margins of the ulcer appeared heaped and were biopsied with cold forceps       for histology. The  surrounding mucosa was very friable. Oozing occurred       with scope trauma as well as with each biopsy.      Small hiatal hernia was present. The exam was otherwise without       abnormality. Impression:               - Normal esophagus.                           - Non-bleeding gastric ulcers with no stigmata of                            bleeding.                           - Non-obstructing non-bleeding duodenal ulcer with                            pigmented material. Ulcer margins were biopsied                            today to evaluate for neoplasm.                           - Small hiatal hernia.                           - The examination was otherwise normal. Recommendation:           - Return patient to hospital  ward for ongoing care.                           - Advance diet as tolerated today.                           - Convert continue Protonix drip to IV BID.                            Complete at least 8 weeks of BID PPI on discharge.                           - Continue serial hgb/hct with transfusion as                            indicated.                           - Avoid all NSAIDs.                           - Continue present medications.                           - Await pathology results.                           - Obtain fasting gastrin level on discharge.                           - Repeat EGD in 8-10 weeks to document healing.                           - Results and recommendations reviewed with Dr.                            Benny Lennert.                           - GI will move to stand-by. Please call the on-call                            gastroenterologist with any additional questions or                            concerns during this hospitalization. Procedure Code(s):        --- Professional ---                           [phone], Esophagogastroduodenoscopy, flexible,                            transoral; with biopsy, single or multiple Diagnosis Code(s):         --- Professional ---  K25.9, Gastric ulcer, unspecified as acute or                            chronic, without hemorrhage or perforation                           K26.9, Duodenal ulcer, unspecified as acute or                            chronic, without hemorrhage or perforation                           K26.3, Acute duodenal ulcer without hemorrhage or                            perforation                           K26.0, Acute duodenal ulcer with hemorrhage CPT copyright 2019 American Medical Association. All rights reserved. The codes documented in this report are preliminary and upon coder review may  be revised to meet current compliance requirements. Thornton Park MD, MD 01/19/2019 8:22:52 AM This report has been signed electronically. Number of Addenda: 0

## 2019-01-19 NOTE — Interval H&P Note (Signed)
History and Physical Interval Note:  01/19/2019 7:53 AM  Angelica Kelley  has presented today for surgery, with the diagnosis of Duodenal ulcer.  The various methods of treatment have been discussed with the patient and family. After consideration of risks, benefits and other options for treatment, the patient has consented to  Procedure(s): ESOPHAGOGASTRODUODENOSCOPY (EGD) WITH PROPOFOL (N/A) as a surgical intervention.  The patient's history has been reviewed, patient examined, no change in status, stable for surgery.  I have reviewed the patient's chart and labs.  Questions were answered to the patient's satisfaction.     Thornton Park

## 2019-01-19 NOTE — Anesthesia Preprocedure Evaluation (Addendum)
Anesthesia Evaluation  Patient identified by MRN, date of birth, ID band Patient awake    Reviewed: Allergy & Precautions, NPO status , Patient's Chart, lab work & pertinent test results  Airway Mallampati: II  TM Distance: >3 FB Neck ROM: Full    Dental  (+) Missing   Pulmonary former smoker,    Pulmonary exam normal breath sounds clear to auscultation       Cardiovascular hypertension, +CHF  Normal cardiovascular exam+ Valvular Problems/Murmurs  Rhythm:Regular Rate:Normal  ECG: SR, rate 71   Neuro/Psych PSYCHIATRIC DISORDERS Anxiety Depression Bipolar Disorder Dementia TIA   GI/Hepatic GERD  Medicated,(+)     substance abuse  , IBS (irritable bowel syndrome)   Endo/Other  negative endocrine ROS  Renal/GU negative Renal ROS     Musculoskeletal negative musculoskeletal ROS (+)   Abdominal   Peds  Hematology  (+) anemia , HLD   Anesthesia Other Findings Duodenal ulcer  Reproductive/Obstetrics                            Anesthesia Physical Anesthesia Plan  ASA: III  Anesthesia Plan: MAC   Post-op Pain Management:    Induction: Intravenous  PONV Risk Score and Plan: 2 and Propofol infusion and Treatment may vary due to age or medical condition  Airway Management Planned: Nasal Cannula  Additional Equipment:   Intra-op Plan:   Post-operative Plan:   Informed Consent: I have reviewed the patients History and Physical, chart, labs and discussed the procedure including the risks, benefits and alternatives for the proposed anesthesia with the patient or authorized representative who has indicated his/her understanding and acceptance.     Dental advisory given  Plan Discussed with: CRNA  Anesthesia Plan Comments:        Anesthesia Quick Evaluation

## 2019-01-19 NOTE — NC FL2 (Addendum)
[place] MEDICAID FL2 LEVEL OF CARE SCREENING TOOL     IDENTIFICATION  Patient Name: Angelica Kelley Birthdate: 06-08-1945 Sex: female Admission Date (Current Location): 01/11/2019  [facility] [place] Number:  Herbalist and Address:  [facility] [place] [address], [place], [place] [address]      Provider Number: [id number]  Attending Physician Name and Address:  Karie Kirks, DO  Relative Name and Phone Number:       Current Level of Care: Hospital Recommended Level of Care: Lane Prior Approval Number:    Date Approved/Denied:   PASRR Number:    Discharge Plan: Other (Comment)(ALF)    Current Diagnoses: Patient Active Problem List   Diagnosis Date Noted  . Duodenal ulcer   . Acute blood loss anemia 01/11/2019  . Syncope 10/12/2017  . Benzodiazepine overdose 09/07/2017  . Dementia ([place]) 09/07/2017  . Chronic diastolic heart failure ([place]) 09/07/2017  . Chronic anemia 09/07/2017  . Hypokalemia 08/19/2017  . Depression with anxiety 05/09/2017  . Fall 05/09/2017  . Slurred speech 05/09/2017  . CKD (chronic kidney disease) stage 2, GFR 60-89 ml/min 04/23/2017  . OAB (overactive bladder) 06/09/2014  . Vitamin D deficiency 10/27/2013  . Medication management 10/27/2013  . Hypertension   . Hyperlipidemia, mixed   . GERD (gastroesophageal reflux disease)   . Bipolar depression ([place])   . Alcoholism ([place])   . IBS (irritable bowel syndrome)   . Other abnormal glucose     Orientation RESPIRATION BLADDER Height & Weight     Self, Place, Time  Normal Continent Weight: 137 lb (62.1 kg) Height:  5\' 6"  (167.6 cm)  BEHAVIORAL SYMPTOMS/MOOD NEUROLOGICAL BOWEL NUTRITION STATUS      Continent Regular diet  AMBULATORY STATUS COMMUNICATION OF NEEDS Skin   Independent Verbally Surgical wounds(Closed incision, right groin, Tape dressing;Gauze)                       Personal Care Assistance Level of Assistance  Feeding,  Bathing, Dressing Bathing Assistance: Independent Feeding assistance: Independent Dressing Assistance: Independent     Functional Limitations Info  Sight, Speech, Hearing Sight Info: Adequate Hearing Info: Adequate      SPECIAL CARE FACTORS FREQUENCY                       Contractures Contractures Info: Not present    Additional Factors Info  Code Status, Allergies Code Status Info: Full Code Allergies Info: Lipitor (Atorvastatin), Prednisone           Current Medications (01/19/2019):  This is the current hospital active medication list Current Facility-Administered Medications  Medication Dose Route Frequency Provider Last Rate Last Dose  . acetaminophen (TYLENOL) tablet 650 mg  650 mg Oral Q6H PRN Thornton Park, MD   650 mg at 01/12/19 [id number]   Or  . acetaminophen (TYLENOL) suppository 650 mg  650 mg Rectal Q6H PRN Thornton Park, MD      . ARIPiprazole (ABILIFY) tablet 5 mg  5 mg Oral Daily Thornton Park, MD   5 mg at 01/19/19 0943  . cholecalciferol (VITAMIN D3) tablet 1,000 Units  1,000 Units Oral Daily Thornton Park, MD   1,000 Units at 01/19/19 0944  . clonazePAM (KLONOPIN) tablet 0.5 mg  0.5 mg Oral Daily PRN Thornton Park, MD   0.5 mg at 01/17/19 2152  . donepezil (ARICEPT) tablet 10 mg  10 mg Oral Jake Shark, MD  10 mg at 01/18/19 2211  . gabapentin (NEURONTIN) capsule 200 mg  200 mg Oral BID Thornton Park, MD   200 mg at 01/19/19 0933  . lamoTRIgine (LAMICTAL) tablet 400 mg  400 mg Oral Daily Thornton Park, MD   400 mg at 01/19/19 0935  . ondansetron (ZOFRAN) tablet 4 mg  4 mg Oral Q6H PRN Thornton Park, MD       Or  . ondansetron (ZOFRAN) injection 4 mg  4 mg Intravenous Q6H PRN Thornton Park, MD   4 mg at 01/15/19 2242  . pantoprazole (PROTONIX) injection 40 mg  40 mg Intravenous Antony Odea, MD   40 mg at 01/19/19 0947  . rosuvastatin (CRESTOR) tablet 40 mg  40 mg Oral Daily Thornton Park, MD    40 mg at 01/19/19 0946  . sertraline (ZOLOFT) tablet 200 mg  200 mg Oral Daily Thornton Park, MD   200 mg at 01/19/19 0933  . sodium chloride flush (NS) 0.9 % injection 3 mL  3 mL Intravenous Q12H Thornton Park, MD   3 mL at 01/19/19 0948  . thiamine (VITAMIN B-1) tablet 250 mg  250 mg Oral BID Thornton Park, MD   250 mg at 01/19/19 [id number]     Discharge Medications: Please see discharge summary for a list of discharge medications.  Relevant Imaging Results:  Relevant Lab Results:   Additional Information [phone]  Eileen Stanford, LCSW

## 2019-01-19 NOTE — Anesthesia Postprocedure Evaluation (Signed)
Anesthesia Post Note  Patient: Angelica Kelley  Procedure(s) Performed: ESOPHAGOGASTRODUODENOSCOPY (EGD) WITH PROPOFOL (N/A ) BIOPSY     Patient location during evaluation: Endoscopy Anesthesia Type: MAC Level of consciousness: awake and alert Pain management: pain level controlled Vital Signs Assessment: post-procedure vital signs reviewed and stable Respiratory status: spontaneous breathing, nonlabored ventilation, respiratory function stable and patient connected to nasal cannula oxygen Cardiovascular status: stable and blood pressure returned to baseline Postop Assessment: no apparent nausea or vomiting Anesthetic complications: no    Last Vitals:  Vitals:   01/19/19 1123 01/19/19 1630  BP: (!) 131/50 (!) 112/44  Pulse: 70 64  Resp: 16 15  Temp: 36.7 C 36.6 C  SpO2: 94% 99%    Last Pain:  Vitals:   01/19/19 1630  TempSrc: Oral  PainSc:                  Angelica Kelley

## 2019-01-19 NOTE — Progress Notes (Signed)
Occupational Therapy Treatment Patient Details Name: Angelica Kelley MRN: [id number] DOB: [birth year] Today's Date: 01/19/2019    History of present illness 73 year old Caucasian female with past medical history significant for dementia, TIA, hypertension, hyperlipidemia, diastolic congestive heart failure, atrial fibrillation, alcoholism and iron deficiency anemia.  Patient was admitted following a fall and reported hematemesis and melena stools.    OT comments  Pt supine in bed with c/o nausea but agreeable to OT intervention. RN notified. Pt declined OOB activities secondary to nausea. OT providing demonstrations and education for B UE strengthening exercises with use of level 1 resistive theraband. Pt with L shoulder pain and modifications made as needed. Pt performed 2 sets of 10 chest pulls, shoulder elevation, shoulder diagonals, and alternating punches with min cuing for proper technique. OT assisted pt with calling niece as she was unable to follow directions labeled on phone to complete call. Pt would continue to benefit from OT intervention until discharge.   Follow Up Recommendations  No OT follow up;Supervision/Assistance - 24 hour    Equipment Recommendations  None recommended by OT    Recommendations for Other Services      Precautions / Restrictions Precautions Precautions: Fall       Mobility Bed Mobility Overal bed mobility: Needs Assistance Bed Mobility: Rolling     Supine to sit: Supervision     General bed mobility comments: supervision for safety  Transfers        General transfer comment: declined secondary to nausea        ADL either performed or assessed with clinical judgement        Vision Baseline Vision/History: No visual deficits            Cognition Arousal/Alertness: Awake/alert Behavior During Therapy: WFL for tasks assessed/performed Overall Cognitive Status: History of cognitive impairments - at baseline                        Pertinent Vitals/ Pain       Pain Assessment: Faces Faces Pain Scale: No hurt         Frequency  Min 2X/week        Progress Toward Goals  OT Goals(current goals can now be found in the care plan section)  Progress towards OT goals: Progressing toward goals  Acute Rehab OT Goals Patient Stated Goal: feel better OT Goal Formulation: With patient Time For Goal Achievement: 02/02/19 Potential to Achieve Goals: Good  Plan Discharge plan remains appropriate       AM-PAC OT "6 Clicks" Daily Activity     Outcome Measure   Help from another person eating meals?: None Help from another person taking care of personal grooming?: None Help from another person toileting, which includes using toliet, bedpan, or urinal?: A Little Help from another person bathing (including washing, rinsing, drying)?: A Little Help from another person to put on and taking off regular upper body clothing?: None Help from another person to put on and taking off regular lower body clothing?: A Little 6 Click Score: 21    End of Session    OT Visit Diagnosis: Unsteadiness on feet (R26.81);Muscle weakness (generalized) (M62.81);Pain   Activity Tolerance Patient tolerated treatment well;Patient limited by fatigue;Other (comment)(nausea)   Patient Left with call bell/phone within reach;in bed;with bed alarm set   Nurse Communication Mobility status;Other (comment)(nausea)        Time: [id number] OT Time Calculation (min): 13 min  Charges: OT General Charges $OT Visit:  1 Visit OT Treatments $Therapeutic Exercise: 8-22 mins   Gypsy Decant MS, OTR/L 01/19/2019, 3:47 PM

## 2019-01-19 NOTE — TOC Initial Note (Signed)
Transition of Care [facility]) - Initial/Assessment Note    Patient Details  Name: ITALEE VELES MRN: [id number] Date of Birth: 1945-08-05  [facility] CM/SW Contact:    Eileen Stanford, LCSW Phone Number: 01/19/2019, 11:35 AM  Clinical Narrative:       Pt confirmed she is [facility] (ALF). Pt couldn't report which [facility] however, in past notes it was determined pt is from [facility].   Pt agreeable to return. CSW to follow up with facility.         Expected Discharge Plan: Home/Self Care Barriers to Discharge: Continued Medical Work up   Patient Goals and CMS Choice Patient states their goals for this hospitalization and ongoing recovery are:: "to get back home"      Expected Discharge Plan and Services Expected Discharge Plan: Home/Self Care In-house Referral: NA   Post Acute Care Choice: NA Living arrangements for the past 2 months: Assisted Living Facility                           HH Arranged: NA          Prior Living Arrangements/Services Living arrangements for the past 2 months: [place] Lives with:: Self Patient language and need for interpreter reviewed:: Yes Do you feel safe going back to the place where you live?: Yes      Need for Family Participation in Patient Care: Yes (Comment) Care giver support system in place?: Yes (comment)   Criminal Activity/Legal Involvement Pertinent to Current Situation/Hospitalization: No - Comment as needed  Activities of Daily Living Home Assistive Devices/Equipment: None ADL Screening (condition at time of admission) Patient's cognitive ability adequate to safely complete daily activities?: Yes Is the patient deaf or have difficulty hearing?: No Does the patient have difficulty seeing, even when wearing glasses/contacts?: No Does the patient have difficulty concentrating, remembering, or making decisions?: No Patient able to express need for assistance with ADLs?:  Yes Does the patient have difficulty dressing or bathing?: No Independently performs ADLs?: Yes (appropriate for developmental age) Does the patient have difficulty walking or climbing stairs?: No Weakness of Legs: Both Weakness of Arms/Hands: None  Permission Sought/Granted Permission sought to share information with : Family Supports, Chartered certified accountant granted to share information with : Yes, Verbal Permission Granted  Share Information with NAME: [name]  Permission granted to share info w AGENCY: [facility]  Permission granted to share info w Relationship: Neice  Permission granted to share info w Contact Information: [phone]  Emotional Assessment Appearance:: (unable to assess. CSW working remotly) Attitude/Demeanor/Rapport: Engaged Affect (typically observed): Appropriate, Calm Orientation: : Oriented to Self, Oriented to Place, Oriented to  Time, Oriented to Situation Alcohol / Substance Use: Not Applicable Psych Involvement: No (comment)  Admission diagnosis:  Melena [K92.1] Patient Active Problem List   Diagnosis Date Noted  . Duodenal ulcer   . Acute blood loss anemia 01/11/2019  . Syncope 10/12/2017  . Benzodiazepine overdose 09/07/2017  . Dementia ([place]) 09/07/2017  . Chronic diastolic heart failure ([place]) 09/07/2017  . Chronic anemia 09/07/2017  . Hypokalemia 08/19/2017  . Depression with anxiety 05/09/2017  . Fall 05/09/2017  . Slurred speech 05/09/2017  . CKD (chronic kidney disease) stage 2, GFR 60-89 ml/min 04/23/2017  . OAB (overactive bladder) 06/09/2014  . Vitamin D deficiency 10/27/2013  . Medication management 10/27/2013  . Hypertension   . Hyperlipidemia, mixed   . GERD (gastroesophageal reflux disease)   .  Bipolar depression ([place])   . Alcoholism ([place])   . IBS (irritable bowel syndrome)   . Other abnormal glucose    PCP:  No primary care provider on file. Pharmacy:   [facility] [phone] [place], [place] -  [place] [address] [place] [address] [place] [address] Phone: [phone] Fax: [phone]     Social Determinants of Health ([place]) Interventions    Readmission Risk Interventions No flowsheet data found.

## 2019-01-20 ENCOUNTER — Encounter: Payer: Self-pay | Admitting: *Deleted

## 2019-01-20 ENCOUNTER — Telehealth: Payer: Self-pay | Admitting: *Deleted

## 2019-01-20 ENCOUNTER — Other Ambulatory Visit: Payer: Self-pay | Admitting: *Deleted

## 2019-01-20 ENCOUNTER — Encounter (HOSPITAL_COMMUNITY): Payer: Self-pay | Admitting: Gastroenterology

## 2019-01-20 DIAGNOSIS — K219 Gastro-esophageal reflux disease without esophagitis: Secondary | ICD-10-CM

## 2019-01-20 DIAGNOSIS — D649 Anemia, unspecified: Secondary | ICD-10-CM

## 2019-01-20 LAB — BASIC METABOLIC PANEL
Anion gap: 5 (ref 5–15)
Anion gap: 7 (ref 5–15)
BUN: 11 mg/dL (ref 8–23)
BUN: 10 mg/dL (ref 8–23)
CO2: 27 mmol/L (ref 22–32)
CO2: 25 mmol/L (ref 22–32)
Calcium: 7.6 mg/dL — ABNORMAL LOW (ref 8.9–10.3)
Calcium: 7.8 mg/dL — ABNORMAL LOW (ref 8.9–10.3)
Chloride: 110 mmol/L (ref 98–111)
Chloride: 109 mmol/L (ref 98–111)
Creatinine, Ser: 0.86 mg/dL (ref 0.44–1.00)
Creatinine, Ser: 0.87 mg/dL (ref 0.44–1.00)
GFR calc Af Amer: >60 mL/min (ref >60)
GFR calc Af Amer: >60 mL/min (ref >60)
GFR calc non Af Amer: >60 mL/min (ref >60)
GFR calc non Af Amer: >60 mL/min (ref >60)
Glucose, Bld: 108 mg/dL — ABNORMAL HIGH (ref 70–99)
Glucose, Bld: 120 mg/dL — ABNORMAL HIGH (ref 70–99)
Potassium: 2.5 mmol/L — CL (ref 3.5–5.1)
Potassium: 3.1 mmol/L — ABNORMAL LOW (ref 3.5–5.1)
Sodium: 142 mmol/L (ref 135–145)
Sodium: 141 mmol/L (ref 135–145)

## 2019-01-20 LAB — CBC WITH DIFFERENTIAL/PLATELET
Abs Immature Granulocytes: 0.05 10*3/uL (ref 0.00–0.07)
Basophils Absolute: 0 10*3/uL (ref 0.0–0.1)
Basophils Relative: 1 %
Eosinophils Absolute: 0.1 10*3/uL (ref 0.0–0.5)
Eosinophils Relative: 2 %
HCT: 23.6 % — ABNORMAL LOW (ref 36.0–46.0)
Hemoglobin: 7.7 g/dL — ABNORMAL LOW (ref 12.0–15.0)
Immature Granulocytes: 1 %
Lymphocytes Relative: 21 %
Lymphs Abs: 1.1 10*3/uL (ref 0.7–4.0)
MCH: 29.4 pg (ref 26.0–34.0)
MCHC: 32.6 g/dL (ref 30.0–36.0)
MCV: 90.1 fL (ref 80.0–100.0)
Monocytes Absolute: 0.4 10*3/uL (ref 0.1–1.0)
Monocytes Relative: 8 %
Neutro Abs: 3.6 10*3/uL (ref 1.7–7.7)
Neutrophils Relative %: 67 %
Platelets: 230 10*3/uL (ref 150–400)
RBC: 2.62 MIL/uL — ABNORMAL LOW (ref 3.87–5.11)
RDW: 15.5 % (ref 11.5–15.5)
WBC: 5.4 10*3/uL (ref 4.0–10.5)
nRBC: 0 % (ref 0.0–0.2)

## 2019-01-20 LAB — HEMOGLOBIN AND HEMATOCRIT, BLOOD
HCT: 27.9 % — ABNORMAL LOW (ref 36.0–46.0)
Hemoglobin: 8.8 g/dL — ABNORMAL LOW (ref 12.0–15.0)

## 2019-01-20 LAB — GLUCOSE, CAPILLARY
Glucose-Capillary: 85 mg/dL (ref 70–99)
Glucose-Capillary: 93 mg/dL (ref 70–99)

## 2019-01-20 MED ORDER — PANTOPRAZOLE SODIUM 40 MG PO TBEC
40.0000 mg | DELAYED_RELEASE_TABLET | Freq: Two times a day (BID) | ORAL | Status: DC
  Administered 2019-01-20 – 2019-01-21 (×2): 40 mg via ORAL
  Filled 2019-01-20 (×2): qty 1

## 2019-01-20 MED ORDER — POTASSIUM CHLORIDE CRYS ER 20 MEQ PO TBCR
40.0000 meq | EXTENDED_RELEASE_TABLET | Freq: Once | ORAL | Status: AC
  Administered 2019-01-20: 02:00:00 40 meq via ORAL
  Filled 2019-01-20: qty 2

## 2019-01-20 MED ORDER — PANTOPRAZOLE SODIUM 40 MG PO TBEC: 40.0000 mg | DELAYED_RELEASE_TABLET | Freq: Two times a day (BID) | ORAL | 0 refills | Status: DC

## 2019-01-20 MED ORDER — POLYETHYLENE GLYCOL 3350 17 G PO PACK
17.0000 g | PACK | Freq: Every day | ORAL | Status: DC
  Administered 2019-01-20 – 2019-01-21 (×2): 17 g via ORAL
  Filled 2019-01-20 (×2): qty 1

## 2019-01-20 MED ORDER — BISACODYL 10 MG RE SUPP
10.0000 mg | Freq: Once | RECTAL | Status: AC
  Administered 2019-01-20: 11:00:00 10 mg via RECTAL
  Filled 2019-01-20: qty 1

## 2019-01-20 MED ORDER — POTASSIUM CHLORIDE 10 MEQ/100ML IV SOLN
10.0000 meq | INTRAVENOUS | Status: AC
  Administered 2019-01-20 (×4): 10 meq via INTRAVENOUS
  Filled 2019-01-20 (×4): qty 100

## 2019-01-20 NOTE — Telephone Encounter (Addendum)
Letter sent to patient, concerning the need for labs in 2 weeks (Fasting serum gastrin, CBC), 4 week f/u scheduled on 10/5 at 1:50 pm.   Reminder for EGD in 8-10 weeks sent as a staff message to myself.   Pantoprazole 40 mg BID x60 days orders per Dr. Tarri Glenn in [place].

## 2019-01-20 NOTE — Progress Notes (Signed)
PROGRESS NOTE  Angelica Kelley [id number] DOB: December 03, 1945 DOA: 01/11/2019 PCP: No primary care provider on file.  Brief History   Patient is a 73 year old Caucasian female with past medical history significant for dementia, TIA, hypertension, hyperlipidemia, diastolic congestive heart failure, atrial fibrillation, alcoholism and iron deficiency anemia.  Patient was admitted following a fall and reported hematemesis and melena stools.  On presentation to the hospital, hemoglobin was documented as 3.5 g/dL.  Last hemoglobin was 10 g/dL.  Other pertinent labs include serum creatinine had gone up from 0.75-1.5, albumin of 1.8, iron of less than 5, ferritin of 9, folate of 6.8 with INR of 1.2.  EGD done earlier today revealed: "Multiple non-bleeding gastric ulcers with no stigmata of bleeding".  Non-bleeding duodenal ulcer with adherent clot. This is at very high risk for re-bleeding". The patient is currently completing 72 hours of IV Protonix. GI will follow up with repeat EGD to reasses and perform biopsies.   The patient has received a total of 6 units of blood with 2 units having been transfused on 01/14/2019. Her hemoglobin this morning following these 2 units is 8.3 after being 6.2 yesterday before transfusion. She received a 7th unit in transfusion on 01/15/2019.   On the afternoon of 01/15/2019 the patient vomited a large quantity of bright red blood. l discussed the patient with Dr. Modena Nunnery who stated that she felt that the duodenal ulcer was likely the source of the bleed and that it was too large for her to successfully intervene endoscopically. She recommended that I consult interventional radiology for embolization. I discussed the patient with Dr. Laurence Ferrari. He took the patient for angiography and embolization. He took the patient for visceral selective angiogram and coil/AVP embolization of the GDA. She has tolerated the procedure well. Hemoglobin is being monitored and may be require further  transfusion.  This morning the patient's hemoglobin was found to be 6.5 down from 8.3 yesterday. The patient has required 8 units of blood intransfusion since admission. After the 01/17/2019 transfusion, he hemoglobin increased to 8.8, then dropped to 8.1 this morning. She will remain on the protonix drip. Repeat EGD on 01/19/2019 demonstrated: Normal esophagus. - Non-bleeding gastric ulcers with no stigmata of bleeding. - Non-obstructing non-bleeding duodenal ulcer with pigmented material. Ulcer margins were biopsied today to evaluate for neoplasm. - Small hiatal hernia. - The examination was otherwise normal. Hemoglobin has dropped from 8.1 to 7.7 today.  Consultants  . Gastroenterology . Interventional Radiology  Procedures  . EGD . Angiography and embolization of GDA to stop GI hemorrhage. . Transfusion of 8 units of Hemoglobin  Antibiotics   Anti-infectives (From admission, onward)   Start     Dose/Rate Route Frequency Ordered Stop   01/13/19 2000  vancomycin (VANCOCIN) IVPB 750 mg/150 ml premix  Status:  Discontinued     750 mg 150 mL/hr over 60 Minutes Intravenous Every 24 hours 01/12/19 1925 01/16/19 1604   01/12/19 1930  vancomycin (VANCOCIN) IVPB 1000 mg/200 mL premix     1,000 mg 200 mL/hr over 60 Minutes Intravenous  Once 01/12/19 1839 01/12/19 2317   01/12/19 1830  cefTRIAXone (ROCEPHIN) 1 g in sodium chloride 0.9 % 100 mL IVPB  Status:  Discontinued     1 g 200 mL/hr over 30 Minutes Intravenous Every 24 hours 01/12/19 1816 01/17/19 1038   01/11/19 2200  cefTRIAXone (ROCEPHIN) 1 g in sodium chloride 0.9 % 100 mL IVPB     1 g 200 mL/hr over 30 Minutes Intravenous  Once 01/11/19 2155 01/11/19 2254     Subjective  The patient is resting comfortably. No new complaints.  Objective   Vitals:  Vitals:   01/20/19 0423 01/20/19 0809  BP: (!) 111/49 (!) 118/56  Pulse: 64 67  Resp: 16 18  Temp: 98.3 F (36.8 C) 98.7 F (37.1 C)  SpO2: 96% 100%     Exam:  Constitutional:  . The patient is awake, alert, and oriented x 3. No acute distress. Respiratory:  . No increased work of breathing. . No wheezes, rales, or rhonchi. . No tactile fremitus. Cardiovascular:  . Regular rate and rhythm. . No murmurs, ectopy, or gallups. . No lateral PMI. No thrill. Abdomen:  . Abdomen is soft, non-tender, non-distended. . No hernias, masses, or organomegaly. . Normoactive bowel sounds.  Musculoskeletal:  . No cyanosis, clubbing, or edema. Skin:  . No rashes, lesions, ulcers . palpation of skin: no induration or nodules Neurologic:  . CN 2-12 intact . Sensation all 4 extremities intact Psychiatric:  . Mental status o Mood, affect appropriate o Orientation to person, place, time  . judgment and insight appear intact     I have personally reviewed the following:   Today's Data  . Vitals, CBC, hemoglobin/hematorcrit  Imaging  . CXR - multifocal pulmonary opacities.   Scheduled Meds: . ARIPiprazole  5 mg Oral Daily  . cholecalciferol  1,000 Units Oral Daily  . donepezil  10 mg Oral QHS  . gabapentin  200 mg Oral BID  . lamoTRIgine  400 mg Oral Daily  . pantoprazole  40 mg Oral BID  . polyethylene glycol  17 g Oral Daily  . rosuvastatin  40 mg Oral Daily  . sertraline  200 mg Oral Daily  . sodium chloride flush  3 mL Intravenous Q12H  . thiamine  250 mg Oral BID   Continuous Infusions:   Active Problems:   Symptomatic anemia   Acute blood loss anemia   Duodenal ulcer   LOS: 9 days   A & P   Acute blood loss anemia 2/2 upper GI bleed (see EGD report above): Continue Protonix drip x 72 hours. Repeat EGD as per demonstrated gastric ulcer and duodenal ulcer. The patient has received a total of 6 units of blood with 2 units having been transfused on 01/14/2019. Her hemoglobin this morning following these 2 units is 8.3 after being 6.2 yesterday before transfusion.  Hold ASA and VTE ppx. I appreciate GI's assistance. This  morning the patient's hemoglobin was found to be 6.5 down from 8.3 yesterday. She will remain on the protonix drip, and she is being transfused with her eighth unit of blood. However her hemoglobin proceeded to drop from 8.8 on 01/17/2019 to 8.1 this morning. Will continue to monitor. Repeat EGD on 01/19/2019 demonstrated: Normal esophagus. - Non-bleeding gastric ulcers with no stigmata of bleeding. - Non-obstructing non-bleeding duodenal ulcer with pigmented material. Ulcer margins were biopsied today to evaluate for neoplasm. - Small hiatal hernia. - The examination was otherwise normal. Hemoglobin has dropped from 8.1 to 7.7 today.  Gastric ulcer and duodenal ulcer: On the afternoon of 01/15/2019 the patient vomited a large quantity of bright red blood. l discussed the patient with Dr. Modena Nunnery who stated that she felt that the duodenal ulcer was likely the source of the bleed and that it was too large for her to successfully intervene endoscopically. She recommended that I consult interventional radiology for embolization. I discussed the patient with Dr. Laurence Ferrari. He took the  patient for angiography and embolization. He took the patient for visceral selective angiogram and coil/AVP embolization of the GDA. She has tolerated the procedure well. Hemoglobin is being monitored and may be require further transfusion. Repeat EGD performed today. See results above. Will change PPI to oral as per GI.  Bibasilar groundglass opacities: Atypical pneumonia. Continue Rocephin and Vancomycin. Monitor. Procalcitonin is equivocal at 0.25.  Bipolar disorder: continue home Zoloft, Lamictaland Aricept daily, Klonopin once daily as needed.  Overactive bladder: continue home Myrbetriq  HLD: continue home statin  Afib: not on rate control or anticoagulation, continue to monitor  I have seen and examined this patient myself. I have spent 30 minutes in her evaluation and care.  DVT prophylaxis: SCD Code Status:  Full code Family Communication:  Disposition Plan: Depend on hospital course  Cruzita Lipa, DO [facility] Direct contact: see [email or web address]  7PM-7AM contact night coverage as above 01/20/2019, 4:37 PM  LOS: 2 days

## 2019-01-20 NOTE — Telephone Encounter (Signed)
-----   Message from Thornton Park, MD sent at 01/20/2019  8:58 AM EDT ----- Regarding: RE: She should be a candidate for the [facility]. Thanks! ----- Message ----- From: Dalene Seltzer, RN Sent: 01/20/2019   8:43 AM EDT To: Thornton Park, MD  Dr. Tarri Glenn, do you want this patient to be scheduled in the [facility] or at [facility]? ----- Message ----- From: Thornton Park, MD Sent: 01/19/2019   8:16 AM EDT To: Dalene Seltzer, RN  Please help me arrange hospital follow-up for Angelica Kelley. She needs CBC in 2 weeks. Follow-up appointment with me in 4-6 weeks.  EGD in 8-10 weeks.  If a fasting serum gastrin is not obtained on hospital discharge, she should have a fasting serum gastrin when she has her CBC. She will need to be on pantoprazole 40 mg BID between now and her follow-up EGD. Thank you.

## 2019-01-20 NOTE — Progress Notes (Signed)
CRITICAL VALUE ALERT  Critical Value:  Potassium 2.5  Date & Time Notied: 01/20/2019 0139  Provider Notified: On Call Provider [facility]  Orders Received/Actions taken: 4 runs k+ 10 mEq and 40 mEq PO K+

## 2019-01-20 NOTE — Progress Notes (Signed)
Daily Rounding Note  01/20/2019, 9:26 AM  LOS: 9 days   SUBJECTIVE:   Chief complaint:   Bleeding DU.     Stools watery, yellow per pt report.  Enjoying HH diet.  No weakness, dizziness, abd pain, SOB  OBJECTIVE:         Vital signs in last 24 hours:    Temp:  [97.8 F (36.6 C)-98.7 F (37.1 C)] 98.7 F (37.1 C) (09/08 0809) Pulse Rate:  [64-70] 67 (09/08 0809) Resp:  [15-18] 18 (09/08 0809) BP: (111-131)/(44-56) 118/56 (09/08 0809) SpO2:  [94 %-100 %] 100 % (09/08 0809) Weight:  [62.7 kg] 62.7 kg (09/08 0423) Last BM Date: 01/14/19 Filed Weights   01/19/19 0321 01/19/19 0708 01/20/19 0423  Weight: 62.9 kg 62.1 kg 62.7 kg   General: pleasant and slightly confused as always.  Comfortable.  Pale.     Heart: RRR with murmer Chest: clear bil.  Excellent BS.  No Dyspnea or cough. Abdomen: soft, NT, ND.  BS hypoactive.    Extremities: no CCE Neuro/Psych:  Oriented to self, place.  Appropriate. Follows commands.  Does not ramble off subject during conversation. Trouble remembering occurrences of last few weeks.     Intake/Output from previous day: 09/07 0701 - 09/08 0700 In: 848.8 [P.O.:480; I.V.:10; IV Piggyback:358.8] Out: -   Intake/Output this shift: No intake/output data recorded.  Lab Results: Recent Labs    01/18/19 0435  01/19/19 0150 01/19/19 0919 01/19/19 1642 01/20/19 0049  WBC 7.2  --  6.0  --   --  5.4  HGB 8.1*   < > 8.1* 8.8* 8.2* 7.7*  HCT 24.7*   < > 25.1* 27.9* 24.9* 23.6*  PLT 216  --  227  --   --  230   < > = values in this interval not displayed.   BMET Recent Labs    01/20/19 0049  NA 142  K 2.5*  CL 110  CO2 27  GLUCOSE 108*  BUN 11  CREATININE 0.86  CALCIUM 7.6*   Scheduled Meds: . ARIPiprazole  5 mg Oral Daily  . cholecalciferol  1,000 Units Oral Daily  . donepezil  10 mg Oral QHS  . gabapentin  200 mg Oral BID  . lamoTRIgine  400 mg Oral Daily  . pantoprazole  (PROTONIX) IV  40 mg Intravenous Q12H  . rosuvastatin  40 mg Oral Daily  . sertraline  200 mg Oral Daily  . sodium chloride flush  3 mL Intravenous Q12H  . thiamine  250 mg Oral BID   Continuous Infusions: PRN Meds:.acetaminophen **OR** acetaminophen, clonazePAM, ondansetron **OR** ondansetron (ZOFRAN) IV   ASSESMENT:   *   Bleeding DU.  CGE and burgundy BM's.  Mobic, full dose ASA and no PPI/H2B PTA.   01/12/19 [id number], non-bleeding GUs. Bx'd. Solitary DU w adherent clot,high risk to re bleed. Explain CT findings of gastric, prox duodenal thickening. rebled in setting of day 2.5 PPI gtt 06/15/18 EGD: non-bleeding GUs, non-bleeding DU w pigmented material. No evidence rebleeding 72 hours PPI gtt finished 9/3 at 1430.Restarted after recurrent hematemesis afternoon 9/3. 01/15/2019 angiography, coil/AVP embolization of GDA.  Dr. Jacqulynn Cadet Was taking Mobic, full dose aspirin and no PPI or H2 blocker PTA. 01/19/19 EGD:  Non-bleeding GUs.  Non-bleeding, non-obstructing DU, margins biopsied. Small HH Stool H Pylori negative.     *   IDA anemia.  Hgb 3.8 at admission.  Ferritin 9.  S/p 7 PRBCs.  feraheme on 9/3.  Oral iron PTA.  Hgb diclined.       *   PNA.  Finished Vanco, Rocephin.    *    Hypokalemia.  4 runs K, 40 po K ordered.    *   Dementia.   Not profound.   Assisted Living PTA.      PLAN   *   Switch to BID PO Protonix.     *   Awaiting duodenal ulcer path.  Serum gastrin level ordered for AM.       [name]  01/20/2019, 9:26 AM Phone [phone]    ATTENDING ADDENDUM: Agree with assessment as outlined. Slight decrease in Hgb this AM however repeat later showed stable Hgb of 8s. She is now on oral protonix 40mg  BID and will await pathology from the biopsies taken yesterday, rule out malignancy. She feels well and tolerating a diet, no overt bleeding. Gastrin level pending. If stable today can go home tomorrow or later this afternoon. She will follow up  with her primary GI, Dr. Earlean Shawl.   Greer Cellar, MD [facility]

## 2019-01-20 NOTE — Progress Notes (Addendum)
PROGRESS NOTE  Angelica Kelley [id number] DOB: [birth year] DOA: 01/11/2019 PCP: No primary care provider on file.  Brief History   Patient is a 73 year old Caucasian female with past medical history significant for dementia, TIA, hypertension, hyperlipidemia, diastolic congestive heart failure, atrial fibrillation, alcoholism and iron deficiency anemia.  Patient was admitted following a fall and reported hematemesis and melena stools.  On presentation to the hospital, hemoglobin was documented as 3.5 g/dL.  Last hemoglobin was 10 g/dL.  Other pertinent labs include serum creatinine had gone up from 0.75-1.5, albumin of 1.8, iron of less than 5, ferritin of 9, folate of 6.8 with INR of 1.2.  EGD done earlier today revealed: "Multiple non-bleeding gastric ulcers with no stigmata of bleeding".  Non-bleeding duodenal ulcer with adherent clot. This is at very high risk for re-bleeding". The patient is currently completing 72 hours of IV Protonix. GI will follow up with repeat EGD to reasses and perform biopsies.   The patient has received a total of 6 units of blood with 2 units having been transfused on 01/14/2019. Her hemoglobin this morning following these 2 units is 8.3 after being 6.2 yesterday before transfusion. She received a 7th unit in transfusion on 01/15/2019.   On the afternoon of 01/15/2019 the patient vomited a large quantity of bright red blood. l discussed the patient with Dr. Modena Nunnery who stated that she felt that the duodenal ulcer was likely the source of the bleed and that it was too large for her to successfully intervene endoscopically. She recommended that I consult interventional radiology for embolization. I discussed the patient with Dr. Laurence Ferrari. He took the patient for angiography and embolization. He took the patient for visceral selective angiogram and coil/AVP embolization of the GDA. She has tolerated the procedure well. Hemoglobin is being monitored and may be require further  transfusion.  This morning the patient's hemoglobin was found to be 6.5 down from 8.3 yesterday. The patient has required 8 units of blood intransfusion since admission. After the 01/17/2019 transfusion, he hemoglobin increased to 8.8, then dropped to 8.1 this morning. She will remain on the protonix drip. Plan is for repeat EGD on today.  Consultants  . Gastroenterology . Interventional Radiology  Procedures  . EGD . Angiography and embolization of GDA to stop GI hemorrhage. . Transfusion of 8 units of Hemoglobin  Antibiotics   Anti-infectives (From admission, onward)   Start     Dose/Rate Route Frequency Ordered Stop   01/13/19 2000  vancomycin (VANCOCIN) IVPB 750 mg/150 ml premix  Status:  Discontinued     750 mg 150 mL/hr over 60 Minutes Intravenous Every 24 hours 01/12/19 1925 01/16/19 1604   01/12/19 1930  vancomycin (VANCOCIN) IVPB 1000 mg/200 mL premix     1,000 mg 200 mL/hr over 60 Minutes Intravenous  Once 01/12/19 1839 01/12/19 2317   01/12/19 1830  cefTRIAXone (ROCEPHIN) 1 g in sodium chloride 0.9 % 100 mL IVPB  Status:  Discontinued     1 g 200 mL/hr over 30 Minutes Intravenous Every 24 hours 01/12/19 1816 01/17/19 1038   01/11/19 2200  cefTRIAXone (ROCEPHIN) 1 g in sodium chloride 0.9 % 100 mL IVPB     1 g 200 mL/hr over 30 Minutes Intravenous  Once 01/11/19 2155 01/11/19 2254     Subjective  The patient is resting comfortably. No new complaints.  Objective   Vitals:  Vitals:   01/20/19 0423 01/20/19 0809  BP: (!) 111/49 (!) 118/56  Pulse: 64 67  Resp:  16 18  Temp: 98.3 F (36.8 C) 98.7 F (37.1 C)  SpO2: 96% 100%    Exam:  Constitutional:  . The patient is awake, alert, and oriented x 3. No acute distress. Respiratory:  . No increased work of breathing. . No wheezes, rales, or rhonchi. . No tactile fremitus. Cardiovascular:  . Regular rate and rhythm. . No murmurs, ectopy, or gallups. . No lateral PMI. No thrill. Abdomen:  . Abdomen is soft,  non-tender, non-distended. . No hernias, masses, or organomegaly. . Normoactive bowel sounds.  Musculoskeletal:  . No cyanosis, clubbing, or edema. Skin:  . No rashes, lesions, ulcers . palpation of skin: no induration or nodules Neurologic:  . CN 2-12 intact . Sensation all 4 extremities intact Psychiatric:  . Mental status o Mood, affect appropriate o Orientation to person, place, time  . judgment and insight appear intact     I have personally reviewed the following:   Today's Data  . Vitals, CBC, hemoglobin/hematorcrit  Imaging  . CXR - multifocal pulmonary opacities.   Scheduled Meds: . ARIPiprazole  5 mg Oral Daily  . cholecalciferol  1,000 Units Oral Daily  . donepezil  10 mg Oral QHS  . gabapentin  200 mg Oral BID  . lamoTRIgine  400 mg Oral Daily  . pantoprazole  40 mg Oral BID  . polyethylene glycol  17 g Oral Daily  . rosuvastatin  40 mg Oral Daily  . sertraline  200 mg Oral Daily  . sodium chloride flush  3 mL Intravenous Q12H  . thiamine  250 mg Oral BID   Continuous Infusions:   Active Problems:   Symptomatic anemia   Acute blood loss anemia   Duodenal ulcer   LOS: 9 days   A & P   Acute blood loss anemia 2/2 upper GI bleed (see EGD report above): Continue Protonix drip x 72 hours. Repeat EGD as per demonstrated gastric ulcer and duodenal ulcer. The patient has received a total of 6 units of blood with 2 units having been transfused on 01/14/2019. Her hemoglobin this morning following these 2 units is 8.3 after being 6.2 yesterday before transfusion.  Hold ASA and VTE ppx. I appreciate GI's assistance. This morning the patient's hemoglobin was found to be 6.5 down from 8.3 yesterday. She will remain on the protonix drip, and she is being transfused with her eighth unit of blood. However her hemoglobin proceeded to drop from 8.8 on 01/17/2019 to 8.1 this morning. Will continue to monitor. Plan is for repeat EGD today.  Gastric ulcer and duodenal ulcer:  On the afternoon of 01/15/2019 the patient vomited a large quantity of bright red blood. l discussed the patient with Dr. Modena Nunnery who stated that she felt that the duodenal ulcer was likely the source of the bleed and that it was too large for her to successfully intervene endoscopically. She recommended that I consult interventional radiology for embolization. I discussed the patient with Dr. Laurence Ferrari. He took the patient for angiography and embolization. He took the patient for visceral selective angiogram and coil/AVP embolization of the GDA. She has tolerated the procedure well. Hemoglobin is being monitored and may be require further transfusion. Dr. Modena Nunnery has stated that she intends to take the patient for EGD on Monday to perform biopsies.  Bibasilar groundglass opacities: Atypical pneumonia. Continue Rocephin and Vancomycin. Monitor. Procalcitonin is equivocal at 0.25.  Bipolar disorder: continue home Zoloft, Lamictaland Aricept daily, Klonopin once daily as needed.  Overactive bladder: continue home Myrbetriq  HLD: continue home statin  Afib: not on rate control or anticoagulation, continue to monitor  I have seen and examined this patient myself. I have spent 30 minutes in her evaluation and care.  DVT prophylaxis: SCD Code Status: Full code Family Communication:  Disposition Plan: Depend on hospital course  Jamaris Biernat, DO [facility] Direct contact: see [email or web address]  7PM-7AM contact night coverage as above 01/19/2019, 2:28 PM  LOS: 2 days

## 2019-01-21 ENCOUNTER — Encounter (HOSPITAL_COMMUNITY): Payer: Self-pay

## 2019-01-21 DIAGNOSIS — K920 Hematemesis: Secondary | ICD-10-CM

## 2019-01-21 LAB — CBC
HCT: 24.8 % — ABNORMAL LOW (ref 36.0–46.0)
Hemoglobin: 7.8 g/dL — ABNORMAL LOW (ref 12.0–15.0)
MCH: 28.8 pg (ref 26.0–34.0)
MCHC: 31.5 g/dL (ref 30.0–36.0)
MCV: 91.5 fL (ref 80.0–100.0)
Platelets: 239 10*3/uL (ref 150–400)
RBC: 2.71 MIL/uL — ABNORMAL LOW (ref 3.87–5.11)
RDW: 15.4 % (ref 11.5–15.5)
WBC: 5.7 10*3/uL (ref 4.0–10.5)
nRBC: 0 % (ref 0.0–0.2)

## 2019-01-21 LAB — GASTRIN: Gastrin: 196 pg/mL — ABNORMAL HIGH (ref 0–115)

## 2019-01-21 LAB — GLUCOSE, CAPILLARY: Glucose-Capillary: 105 mg/dL — ABNORMAL HIGH (ref 70–99)

## 2019-01-21 MED ORDER — PANTOPRAZOLE SODIUM 40 MG PO TBEC: 40.0000 mg | DELAYED_RELEASE_TABLET | Freq: Two times a day (BID) | ORAL | 0 refills | Status: AC

## 2019-01-21 NOTE — TOC Transition Note (Signed)
Transition of Care [facility]) - CM/SW Discharge Note   Patient Details  Name: Angelica Kelley MRN: [id number] Date of Birth: 26-Sep-1945  [facility]) CM/SW Contact:  Alberteen Sam, LCSW Phone Number: 01/21/2019, 4:17 PM   Clinical Narrative:     Patient will DC to: [facility] Anticipated DC date: 9/92020 Family notified:[name] [id number]  Per MD patient ready for DC to [facility] . RN, patient, patient's family, and facility notified of DC. Discharge Summary sent to facility. RN given number for report   [phone]. DC packet on chart. Ambulance transport requested for patient.  CSW signing off.  [place], [place]   Final next level of care: Assisted Living Barriers to Discharge: No Barriers Identified   Patient Goals and CMS Choice Patient states their goals for this hospitalization and ongoing recovery are:: to go back to [email or web address].gov Compare Post Acute Care list provided to:: Patient Choice offered to / list presented to : Patient  Discharge Placement PASRR number recieved: 01/19/19            Patient chooses bed at: Other - please specify in the comment section below:([facility]) Patient to be transferred to facility by: [facility] Name of family member notified: [name] Patient and family notified of of transfer: 01/21/19  Discharge Plan and Services In-house Referral: NA   Post Acute Care Choice: NA                    HH Arranged: NA          Social Determinants of Health (SDOH) Interventions     Readmission Risk Interventions No flowsheet data found.

## 2019-01-21 NOTE — Discharge Summary (Signed)
Physician Discharge Summary  JOSHUA AMBRIZ [id number] DOB: Sep 13, 1945 DOA: 01/11/2019  PCP: No primary care provider on file.  Admit date: 01/11/2019 Discharge date: 01/21/2019  Admitted From: ALF Disposition: ALF  Recommendations for Outpatient Follow-up:  1. Follow up with PCP in 1-2 weeks 2. Please obtain BMP/CBC in one week 3. Needs CBC within 48 hours  To follow hb and them in 2 weeks 4. Needs repeat endoscopy in 8-10 weeks.  5. Follow up appointment with GI in 4--6 weeks.   Home Health:none Equipment/Devices: none  Discharge Condition: Stable.  CODE STATUS: full code Diet recommendation: Heart Healthy  Brief/Interim Summary: Patient is a 73 year old Caucasian female with past medical history significant for dementia,TIA, hypertension, hyperlipidemia, diastolic congestive heart failure, atrial fibrillation, alcoholism and iron deficiency anemia. Patient was admitted following a fall and reported hematemesis and melena stools. On presentation to the hospital, hemoglobin was documented as 3.5 g/dL. Last hemoglobin was 10 g/dL. Other pertinent labs include serum creatinine had gone up from 0.75-1.5, albumin of 1.8, iron of less than 5, ferritin of 9, folate of 6.8 with INR of 1.2.  EGD done earlier today revealed:"Multiple non-bleeding gastric ulcers with no stigmata of bleeding". Non-bleeding duodenal ulcer with adherent clot. This is at very high risk for re-bleeding". The patient is currently completing 72 hours of IV Protonix. GI will follow up with repeat EGD to reasses and perform biopsies.   On the afternoon of 01/15/2019 the patient vomited a large quantity of bright red blood. Case discussed with Dr. Modena Nunnery who stated that she felt that the duodenal ulcer was likely the source of the bleed and that it was too large for her to successfully intervene endoscopically. She recommended that I consult interventional radiology for embolization. Case  discussed with Dr. Laurence Ferrari.  He took the patient for angiography and embolization. He took the patient for visceral selective angiogram and coil/AVP embolization of the GDA. She has tolerated the procedure well.   Patient has received a total of 8 units of PRBC. Her Hb has remain stable at 7.8. will discussed with GI today in regards discharge patient today.   1-Acute blood loss anemia secondary to upper GI bleed, multiples gastric ulcer.  Required total 8 unit of PRBC.  Had endoscopy demonstrated multiples gastric and duodenal ulcer.  Underwent angiography and embolization 9/03. initially on PPI drip, now on PPI 40 mg BID for at least 2 months.    Bibasilar groundglass opacities: Atypical pneumonia. Treated  Rocephin and Vancomycin. Monitor. Procalcitonin is equivocal at 0.25.  Bipolar disorder: continue home Zoloft, Lamictaland Aricept daily, Klonopin once daily as needed.  Overactive bladder: continue home Myrbetriq  HLD: continue home statin  Afib: not on rate control or anticoagulation, continue to monitor discontinue aspirin  for now.   Discharge Diagnoses:  Active Problems:   Symptomatic anemia   Acute blood loss anemia   Duodenal ulcer    Discharge Instructions  Discharge Instructions    Diet - low sodium heart healthy   Complete by: As directed    Increase activity slowly   Complete by: As directed      Allergies as of 01/21/2019      Reactions   Lipitor [atorvastatin]    Fatigue   Prednisone Other (See Comments)   Change in mental status      Medication List    STOP taking these medications   aspirin 325 MG EC tablet   ferrous sulfate 325 (65 FE) MG tablet   LORazepam 0.5  MG tablet Commonly known as: ATIVAN   meloxicam 15 MG tablet Commonly known as: MOBIC     TAKE these medications   acetaminophen 325 MG tablet Commonly known as: TYLENOL Take 650 mg by mouth every 6 (six) hours as needed for moderate pain.   ARIPiprazole 5 MG tablet Commonly known as:  ABILIFY Take 5 mg by mouth daily.   Artificial Tears 0.4 % Soln Generic drug: Hypromellose Apply 2 drops to eye 2 (two) times a day.   Biofreeze 4 % Gel Generic drug: Menthol (Topical Analgesic) Apply 1 application topically 2 (two) times a day. Left shoulder pain   cholecalciferol 25 MCG (1000 UT) tablet Commonly known as: VITAMIN D Take 1,000 Units by mouth daily.   clonazePAM 0.5 MG tablet Commonly known as: KLONOPIN Take 0.5 mg by mouth daily as needed for anxiety.   donepezil 10 MG tablet Commonly known as: ARICEPT Take 10 mg by mouth at bedtime.   EQ Magnesium Citrate Soln Generic drug: magnesium citrate Take 150 mLs by mouth daily as needed for mild constipation or severe constipation.   gabapentin 100 MG capsule Commonly known as: NEURONTIN Take 200 mg by mouth 2 (two) times daily.   lamoTRIgine 200 MG tablet Commonly known as: LAMICTAL Take 400 mg by mouth daily.   mirabegron ER 25 MG Tb24 tablet Commonly known as: MYRBETRIQ Take 25 mg by mouth daily.   pantoprazole 40 MG tablet Commonly known as: PROTONIX Take 1 tablet (40 mg total) by mouth 2 (two) times daily.   polyethylene glycol 17 g packet Commonly known as: MIRALAX / GLYCOLAX Take 17 g by mouth daily.   rosuvastatin 40 MG tablet Commonly known as: CRESTOR Take 40 mg by mouth daily.   sennosides-docusate sodium 8.6-50 MG tablet Commonly known as: SENOKOT-S Take 1 tablet by mouth daily.   sertraline 100 MG tablet Commonly known as: ZOLOFT Take 200 mg by mouth daily.   thiamine 100 MG tablet Commonly known as: VITAMIN B-1 Take 250 mg by mouth 2 (two) times a day.   zolpidem 5 MG tablet Commonly known as: AMBIEN Take 5 mg by mouth at bedtime.      Follow-up Information    Richmond Campbell, MD. Call.   Specialty: Gastroenterology Why: call office for appointment.  follow up of ulcers and bleeding Contact information: [address] [place] [address] [phone]         Nahser, Wonda Cheng, MD Follow up.   Specialty: Cardiology Contact information: [address] [place] [address] [phone]          Allergies  Allergen Reactions  . Lipitor [Atorvastatin]     Fatigue  . Prednisone Other (See Comments)    Change in mental status    Consultations:  GI  IR   Procedures/Studies: Ct Abdomen Pelvis W Contrast  Result Date: 01/11/2019 CLINICAL DATA:  Nausea, vomiting and diarrhea. EXAM: CT ABDOMEN AND PELVIS WITH CONTRAST TECHNIQUE: Multidetector CT imaging of the abdomen and pelvis was performed using the standard protocol following bolus administration of intravenous contrast. CONTRAST:  167mL OMNIPAQUE IOHEXOL 300 MG/ML  SOLN COMPARISON:  CT abdomen pelvis Oct 10, 2018 FINDINGS: Lower chest: Multifocal areas of patchy ground-glass opacity present throughout the included lung bases. Cardiac silhouette is enlarged. No pericardial effusion. Aortic leaflet calcifications and calcification of the coronary arteries is noted. Included portion of the descending thoracic aorta is heavily calcified as well. Hepatobiliary: No focal liver abnormality is seen. No gallstones, gallbladder  wall thickening, or frank biliary dilatation. Inflammation in the region the gallbladder fossa is favored to be reactive/related to the adjacent duodenal inflammation. Pancreas: Unremarkable. No pancreatic ductal dilatation or surrounding inflammatory changes. Spleen: Normal in size without focal abnormality. Adrenals/Urinary Tract: Adrenal glands are unremarkable. Kidneys are normal, without renal calculi, focal lesion, or hydronephrosis. Bladder is unremarkable. Stomach/Bowel: There is redemonstration of circumferential wall thickening of the antrum of the stomach and throughout the first through third portions of the duodenal sweep, now with increasing periduodenal inflammatory changes and trace reactive fluid tracking in the retroperitoneum and into the gallbladder  fossa. No extraluminal gas. No organized collection or abscess. Distal small bowel has a more normal caliber. Normal appendix in the right lower quadrant. Moderate volume of stool through the colon. No colonic dilatation or wall thickening. Vascular/Lymphatic: Atherosclerotic plaque within the normal caliber aorta. Reactive adenopathy in the upper abdomen. Reproductive: Uterus is surgically absent. No concerning adnexal lesions. Other: Trace free fluid adjacent to the inflamed duodenum in the gastric antrum. No extraluminal gas or organized collection. No bowel containing hernias. Musculoskeletal: Chronic bilateral pars defects L4. Grade 1 anterolisthesis of L3 on L4, L4 on L5. Stable compression deformities of L3, L4 and L5. Background of diffuse discogenic and facet degenerative change most pronounced at these lower lumbar levels. No acute or suspicious osseous lesions. IMPRESSION: 1. Persistent circumferential wall thickening of the antrum of the stomach and throughout the first through third portions of the duodenal sweep, now with increasing periduodenal inflammatory changes and trace reactive fluid tracking in the retroperitoneum and into the gallbladder fossa. Findings are consistent with worsening duodenitis and possible. No evidence of frank perforation or abscess formation. 2. Multifocal areas of patchy ground-glass opacity throughout the included lung bases, concerning for multifocal pneumonia. 3. Unchanged appearance of the multilevel compression deformities, spondylolysis and spondylolisthesis in the lower lumbar spine. 4. Aortic Atherosclerosis (ICD10-I70.0). Electronically Signed   By: Lovena Le M.D.   On: 01/11/2019 23:24   Ir Angiogram Visceral Selective  Result Date: 01/16/2019 INDICATION: 73 year old female with severe peptic ulcer disease resulting in large gastric and duodenal ulcers and subsequent upper GI bleed requiring 6 unit transfusion thus far. Endoscopic therapy is limited given  the size of the ulcerations. Therefore, she presents for angiogram and embolization. EXAM: IR ULTRASOUND GUIDANCE VASC ACCESS RIGHT; ADDITIONAL ARTERIOGRAPHY; SELECTIVE VISCERAL ARTERIOGRAPHY; IR EMBO ART VEN HEMORR LYMPH EXTRAV INC GUIDE ROADMAPPING 1. Ultrasound-guided vascular access right common femoral artery 2. Catheterization of the splenic artery with arteriogram 3. Catheterization of the superior mesenteric artery with arteriogram 4. Catheterization of the common hepatic artery (arising directly from the aorta) with arteriogram 5. Catheterization of the gastroduodenal artery with arteriogram 6. Catheterization of the gastroepiploic artery with arteriogram 7. Coil and vascular plug embolization of the gastroduodenal artery MEDICATIONS: None ANESTHESIA/SEDATION: Moderate (conscious) sedation was employed during this procedure. A total of Versed 0.5 mg and Fentanyl 25 mcg was administered intravenously. Moderate Sedation Time: 23 minutes. The patient's level of consciousness and vital signs were monitored continuously by radiology nursing throughout the procedure under my direct supervision. CONTRAST:  54mL OMNIPAQUE IOHEXOL 300 MG/ML  SOLN FLUOROSCOPY TIME:  Fluoroscopy Time: 6 minutes 54 seconds (266 mGy). COMPLICATIONS: None immediate. PROCEDURE: Informed consent was obtained from the patient following explanation of the procedure, risks, benefits and alternatives. The patient understands, agrees and consents for the procedure. All questions were addressed. A time out was performed prior to the initiation of the procedure. Maximal barrier sterile technique utilized  including caps, mask, sterile gowns, sterile gloves, large sterile drape, hand hygiene, and Betadine prep. The right common femoral artery was interrogated with ultrasound and found to be widely patent. An image was obtained and stored for the medical record. Local anesthesia was attained by infiltration with 1% lidocaine. A small dermatotomy was  made. Under real-time sonographic guidance, the vessel was punctured with a 21 gauge micropuncture needle. Using standard technique, the initial micro needle was exchanged over a 0.018 micro wire for a transitional 4 [place] micro sheath. The micro sheath was then exchanged over a 0.035 wire for a 5 French vascular sheath. A C2 cobra catheter was advanced over a Bentson wire into the abdominal aorta. The catheter was used to select the first artery from the aorta. Arteriography was performed. This is a splenic artery arising independently from the thoracic aorta. The artery is highly tortuous. There is no collateral flow into the hepatic artery. No evidence of aneurysm or bleeding. C2 cobra catheter was used to select another artery arising from the aorta. Arteriography was performed. This is the superior mesenteric artery. Additionally, faint opacification of the right renal artery and a common hepatic artery arising directly from the aorta is notified on reflux. No evidence of replaced right hepatic artery or significant pancreaticoduodenal collaterals. The catheter was next used to select the common hepatic artery which arises directly from the aorta. Arteriography was performed. A large dorsal pancreatic artery is the first branch. The gastroduodenal artery is also visualized and appears slightly abnormal. Utilizing the Bentson wire, the C2 cobra catheter was used to select the gastroduodenal artery. Arteriography was performed. The mid segment of the gastroduodenal artery is highly abnormal with a beaded and irregular appearance. There is no evidence of active extravasation at this time, however the appearance of the artery suggests arterial injury and arterial spasm, likely from the overlying ulceration. This segment of artery is damaged an at high risk for continued bleeding. Therefore, a renegade ST microcatheter and Fathom wire were introduced through the 5 French catheter. The injured segment of artery was  carefully crossed with the Fathom 16 wire and the renegade STC advanced into the right gastroepiploic artery. Arteriography was performed confirming that the microcatheter is indeed beyond the injured segment of the artery and in the gastroepiploic artery. Coil embolization was then performed first using a 2 x 5 x 5.8 cm vortex interlock detachable coil. Next, a 5 x 15 cm helical interlock coil and a 6 x 20 cm helical interlock coil were deployed across the injured segment of the artery. Next, a 6 x 6.7 cm vortex push below coil was deployed. This brings is back into the more proximal and larger segment of the gastroduodenal artery. The microcatheter was removed. Final embolization was performed using a 6 x 11 mm Amplatzer vascular plug 4. The 5 French catheter was subsequently brought back into the common hepatic artery and arteriography was performed. There is no further flow within the injured segment of the gastroduodenal artery. Embolization is successful. The catheter was removed. Hemostasis was attained with the assistance of an Angio-Seal device. IMPRESSION: 1. Abnormal and injured segment of the gastroduodenal artery presumably in the region of duodenal ulceration. 2. Successful coil and a VP embolization of the injured gastroduodenal artery. 3. Variant anatomy, the common hepatic artery arises directly from the aorta. Signed, Criselda Peaches, MD, [place] [facility] Radiology Electronically Signed   By: Jacqulynn Cadet M.D.   On: 01/16/2019 08:49  Ir Angiogram Selective Each Additional Vessel  Result Date: 01/16/2019 INDICATION: 73 year old female with severe peptic ulcer disease resulting in large gastric and duodenal ulcers and subsequent upper GI bleed requiring 6 unit transfusion thus far. Endoscopic therapy is limited given the size of the ulcerations. Therefore, she presents for angiogram and embolization. EXAM: IR ULTRASOUND GUIDANCE VASC  ACCESS RIGHT; ADDITIONAL ARTERIOGRAPHY; SELECTIVE VISCERAL ARTERIOGRAPHY; IR EMBO ART VEN HEMORR LYMPH EXTRAV INC GUIDE ROADMAPPING 1. Ultrasound-guided vascular access right common femoral artery 2. Catheterization of the splenic artery with arteriogram 3. Catheterization of the superior mesenteric artery with arteriogram 4. Catheterization of the common hepatic artery (arising directly from the aorta) with arteriogram 5. Catheterization of the gastroduodenal artery with arteriogram 6. Catheterization of the gastroepiploic artery with arteriogram 7. Coil and vascular plug embolization of the gastroduodenal artery MEDICATIONS: None ANESTHESIA/SEDATION: Moderate (conscious) sedation was employed during this procedure. A total of Versed 0.5 mg and Fentanyl 25 mcg was administered intravenously. Moderate Sedation Time: 23 minutes. The patient's level of consciousness and vital signs were monitored continuously by radiology nursing throughout the procedure under my direct supervision. CONTRAST:  70mL OMNIPAQUE IOHEXOL 300 MG/ML  SOLN FLUOROSCOPY TIME:  Fluoroscopy Time: 6 minutes 54 seconds (266 mGy). COMPLICATIONS: None immediate. PROCEDURE: Informed consent was obtained from the patient following explanation of the procedure, risks, benefits and alternatives. The patient understands, agrees and consents for the procedure. All questions were addressed. A time out was performed prior to the initiation of the procedure. Maximal barrier sterile technique utilized including caps, mask, sterile gowns, sterile gloves, large sterile drape, hand hygiene, and Betadine prep. The right common femoral artery was interrogated with ultrasound and found to be widely patent. An image was obtained and stored for the medical record. Local anesthesia was attained by infiltration with 1% lidocaine. A small dermatotomy was made. Under real-time sonographic guidance, the vessel was punctured with a 21 gauge micropuncture needle. Using  standard technique, the initial micro needle was exchanged over a 0.018 micro wire for a transitional 4 [place] micro sheath. The micro sheath was then exchanged over a 0.035 wire for a 5 French vascular sheath. A C2 cobra catheter was advanced over a Bentson wire into the abdominal aorta. The catheter was used to select the first artery from the aorta. Arteriography was performed. This is a splenic artery arising independently from the thoracic aorta. The artery is highly tortuous. There is no collateral flow into the hepatic artery. No evidence of aneurysm or bleeding. C2 cobra catheter was used to select another artery arising from the aorta. Arteriography was performed. This is the superior mesenteric artery. Additionally, faint opacification of the right renal artery and a common hepatic artery arising directly from the aorta is notified on reflux. No evidence of replaced right hepatic artery or significant pancreaticoduodenal collaterals. The catheter was next used to select the common hepatic artery which arises directly from the aorta. Arteriography was performed. A large dorsal pancreatic artery is the first branch. The gastroduodenal artery is also visualized and appears slightly abnormal. Utilizing the Bentson wire, the C2 cobra catheter was used to select the gastroduodenal artery. Arteriography was performed. The mid segment of the gastroduodenal artery is highly abnormal with a beaded and irregular appearance. There is no evidence of active extravasation at this time, however the appearance of the artery suggests arterial injury and arterial spasm, likely from the overlying ulceration. This segment of artery is damaged an at high risk for continued bleeding. Therefore, a renegade ST microcatheter  and Fathom wire were introduced through the 5 French catheter. The injured segment of artery was carefully crossed with the Fathom 16 wire and the renegade STC advanced into the right gastroepiploic artery.  Arteriography was performed confirming that the microcatheter is indeed beyond the injured segment of the artery and in the gastroepiploic artery. Coil embolization was then performed first using a 2 x 5 x 5.8 cm vortex interlock detachable coil. Next, a 5 x 15 cm helical interlock coil and a 6 x 20 cm helical interlock coil were deployed across the injured segment of the artery. Next, a 6 x 6.7 cm vortex push below coil was deployed. This brings is back into the more proximal and larger segment of the gastroduodenal artery. The microcatheter was removed. Final embolization was performed using a 6 x 11 mm Amplatzer vascular plug 4. The 5 French catheter was subsequently brought back into the common hepatic artery and arteriography was performed. There is no further flow within the injured segment of the gastroduodenal artery. Embolization is successful. The catheter was removed. Hemostasis was attained with the assistance of an Angio-Seal device. IMPRESSION: 1. Abnormal and injured segment of the gastroduodenal artery presumably in the region of duodenal ulceration. 2. Successful coil and a VP embolization of the injured gastroduodenal artery. 3. Variant anatomy, the common hepatic artery arises directly from the aorta. Signed, Criselda Peaches, MD, [place] [facility] Radiology Electronically Signed   By: Jacqulynn Cadet M.D.   On: 01/16/2019 08:49   Ir Angiogram Selective Each Additional Vessel  Result Date: 01/16/2019 INDICATION: 73 year old female with severe peptic ulcer disease resulting in large gastric and duodenal ulcers and subsequent upper GI bleed requiring 6 unit transfusion thus far. Endoscopic therapy is limited given the size of the ulcerations. Therefore, she presents for angiogram and embolization. EXAM: IR ULTRASOUND GUIDANCE VASC ACCESS RIGHT; ADDITIONAL ARTERIOGRAPHY; SELECTIVE VISCERAL ARTERIOGRAPHY; IR EMBO ART VEN HEMORR LYMPH EXTRAV INC  GUIDE ROADMAPPING 1. Ultrasound-guided vascular access right common femoral artery 2. Catheterization of the splenic artery with arteriogram 3. Catheterization of the superior mesenteric artery with arteriogram 4. Catheterization of the common hepatic artery (arising directly from the aorta) with arteriogram 5. Catheterization of the gastroduodenal artery with arteriogram 6. Catheterization of the gastroepiploic artery with arteriogram 7. Coil and vascular plug embolization of the gastroduodenal artery MEDICATIONS: None ANESTHESIA/SEDATION: Moderate (conscious) sedation was employed during this procedure. A total of Versed 0.5 mg and Fentanyl 25 mcg was administered intravenously. Moderate Sedation Time: 23 minutes. The patient's level of consciousness and vital signs were monitored continuously by radiology nursing throughout the procedure under my direct supervision. CONTRAST:  66mL OMNIPAQUE IOHEXOL 300 MG/ML  SOLN FLUOROSCOPY TIME:  Fluoroscopy Time: 6 minutes 54 seconds (266 mGy). COMPLICATIONS: None immediate. PROCEDURE: Informed consent was obtained from the patient following explanation of the procedure, risks, benefits and alternatives. The patient understands, agrees and consents for the procedure. All questions were addressed. A time out was performed prior to the initiation of the procedure. Maximal barrier sterile technique utilized including caps, mask, sterile gowns, sterile gloves, large sterile drape, hand hygiene, and Betadine prep. The right common femoral artery was interrogated with ultrasound and found to be widely patent. An image was obtained and stored for the medical record. Local anesthesia was attained by infiltration with 1% lidocaine. A small dermatotomy was made. Under real-time sonographic guidance, the vessel was punctured with a 21 gauge micropuncture needle. Using standard technique, the initial micro needle was exchanged  over a 0.018 micro wire for a transitional 4 [place] micro  sheath. The micro sheath was then exchanged over a 0.035 wire for a 5 French vascular sheath. A C2 cobra catheter was advanced over a Bentson wire into the abdominal aorta. The catheter was used to select the first artery from the aorta. Arteriography was performed. This is a splenic artery arising independently from the thoracic aorta. The artery is highly tortuous. There is no collateral flow into the hepatic artery. No evidence of aneurysm or bleeding. C2 cobra catheter was used to select another artery arising from the aorta. Arteriography was performed. This is the superior mesenteric artery. Additionally, faint opacification of the right renal artery and a common hepatic artery arising directly from the aorta is notified on reflux. No evidence of replaced right hepatic artery or significant pancreaticoduodenal collaterals. The catheter was next used to select the common hepatic artery which arises directly from the aorta. Arteriography was performed. A large dorsal pancreatic artery is the first branch. The gastroduodenal artery is also visualized and appears slightly abnormal. Utilizing the Bentson wire, the C2 cobra catheter was used to select the gastroduodenal artery. Arteriography was performed. The mid segment of the gastroduodenal artery is highly abnormal with a beaded and irregular appearance. There is no evidence of active extravasation at this time, however the appearance of the artery suggests arterial injury and arterial spasm, likely from the overlying ulceration. This segment of artery is damaged an at high risk for continued bleeding. Therefore, a renegade ST microcatheter and Fathom wire were introduced through the 5 French catheter. The injured segment of artery was carefully crossed with the Fathom 16 wire and the renegade STC advanced into the right gastroepiploic artery. Arteriography was performed confirming that the microcatheter is indeed beyond the injured segment of the artery and in  the gastroepiploic artery. Coil embolization was then performed first using a 2 x 5 x 5.8 cm vortex interlock detachable coil. Next, a 5 x 15 cm helical interlock coil and a 6 x 20 cm helical interlock coil were deployed across the injured segment of the artery. Next, a 6 x 6.7 cm vortex push below coil was deployed. This brings is back into the more proximal and larger segment of the gastroduodenal artery. The microcatheter was removed. Final embolization was performed using a 6 x 11 mm Amplatzer vascular plug 4. The 5 French catheter was subsequently brought back into the common hepatic artery and arteriography was performed. There is no further flow within the injured segment of the gastroduodenal artery. Embolization is successful. The catheter was removed. Hemostasis was attained with the assistance of an Angio-Seal device. IMPRESSION: 1. Abnormal and injured segment of the gastroduodenal artery presumably in the region of duodenal ulceration. 2. Successful coil and a VP embolization of the injured gastroduodenal artery. 3. Variant anatomy, the common hepatic artery arises directly from the aorta. Signed, Criselda Peaches, MD, [place] [facility] Radiology Electronically Signed   By: Jacqulynn Cadet M.D.   On: 01/16/2019 08:49   Ir Angiogram Selective Each Additional Vessel  Result Date: 01/16/2019 INDICATION: 73 year old female with severe peptic ulcer disease resulting in large gastric and duodenal ulcers and subsequent upper GI bleed requiring 6 unit transfusion thus far. Endoscopic therapy is limited given the size of the ulcerations. Therefore, she presents for angiogram and embolization. EXAM: IR ULTRASOUND GUIDANCE VASC ACCESS RIGHT; ADDITIONAL ARTERIOGRAPHY; SELECTIVE VISCERAL ARTERIOGRAPHY; IR EMBO ART VEN HEMORR LYMPH EXTRAV INC GUIDE ROADMAPPING 1.  Ultrasound-guided vascular access right common femoral artery 2. Catheterization of the splenic  artery with arteriogram 3. Catheterization of the superior mesenteric artery with arteriogram 4. Catheterization of the common hepatic artery (arising directly from the aorta) with arteriogram 5. Catheterization of the gastroduodenal artery with arteriogram 6. Catheterization of the gastroepiploic artery with arteriogram 7. Coil and vascular plug embolization of the gastroduodenal artery MEDICATIONS: None ANESTHESIA/SEDATION: Moderate (conscious) sedation was employed during this procedure. A total of Versed 0.5 mg and Fentanyl 25 mcg was administered intravenously. Moderate Sedation Time: 23 minutes. The patient's level of consciousness and vital signs were monitored continuously by radiology nursing throughout the procedure under my direct supervision. CONTRAST:  60mL OMNIPAQUE IOHEXOL 300 MG/ML  SOLN FLUOROSCOPY TIME:  Fluoroscopy Time: 6 minutes 54 seconds (266 mGy). COMPLICATIONS: None immediate. PROCEDURE: Informed consent was obtained from the patient following explanation of the procedure, risks, benefits and alternatives. The patient understands, agrees and consents for the procedure. All questions were addressed. A time out was performed prior to the initiation of the procedure. Maximal barrier sterile technique utilized including caps, mask, sterile gowns, sterile gloves, large sterile drape, hand hygiene, and Betadine prep. The right common femoral artery was interrogated with ultrasound and found to be widely patent. An image was obtained and stored for the medical record. Local anesthesia was attained by infiltration with 1% lidocaine. A small dermatotomy was made. Under real-time sonographic guidance, the vessel was punctured with a 21 gauge micropuncture needle. Using standard technique, the initial micro needle was exchanged over a 0.018 micro wire for a transitional 4 [place] micro sheath. The micro sheath was then exchanged over a 0.035 wire for a 5 French vascular sheath. A C2 cobra catheter was  advanced over a Bentson wire into the abdominal aorta. The catheter was used to select the first artery from the aorta. Arteriography was performed. This is a splenic artery arising independently from the thoracic aorta. The artery is highly tortuous. There is no collateral flow into the hepatic artery. No evidence of aneurysm or bleeding. C2 cobra catheter was used to select another artery arising from the aorta. Arteriography was performed. This is the superior mesenteric artery. Additionally, faint opacification of the right renal artery and a common hepatic artery arising directly from the aorta is notified on reflux. No evidence of replaced right hepatic artery or significant pancreaticoduodenal collaterals. The catheter was next used to select the common hepatic artery which arises directly from the aorta. Arteriography was performed. A large dorsal pancreatic artery is the first branch. The gastroduodenal artery is also visualized and appears slightly abnormal. Utilizing the Bentson wire, the C2 cobra catheter was used to select the gastroduodenal artery. Arteriography was performed. The mid segment of the gastroduodenal artery is highly abnormal with a beaded and irregular appearance. There is no evidence of active extravasation at this time, however the appearance of the artery suggests arterial injury and arterial spasm, likely from the overlying ulceration. This segment of artery is damaged an at high risk for continued bleeding. Therefore, a renegade ST microcatheter and Fathom wire were introduced through the 5 French catheter. The injured segment of artery was carefully crossed with the Fathom 16 wire and the renegade STC advanced into the right gastroepiploic artery. Arteriography was performed confirming that the microcatheter is indeed beyond the injured segment of the artery and in the gastroepiploic artery. Coil embolization was then performed first using a 2 x 5 x 5.8 cm vortex interlock  detachable coil. Next, a 5  x 15 cm helical interlock coil and a 6 x 20 cm helical interlock coil were deployed across the injured segment of the artery. Next, a 6 x 6.7 cm vortex push below coil was deployed. This brings is back into the more proximal and larger segment of the gastroduodenal artery. The microcatheter was removed. Final embolization was performed using a 6 x 11 mm Amplatzer vascular plug 4. The 5 French catheter was subsequently brought back into the common hepatic artery and arteriography was performed. There is no further flow within the injured segment of the gastroduodenal artery. Embolization is successful. The catheter was removed. Hemostasis was attained with the assistance of an Angio-Seal device. IMPRESSION: 1. Abnormal and injured segment of the gastroduodenal artery presumably in the region of duodenal ulceration. 2. Successful coil and a VP embolization of the injured gastroduodenal artery. 3. Variant anatomy, the common hepatic artery arises directly from the aorta. Signed, Criselda Peaches, MD, [place] [facility] Radiology Electronically Signed   By: Jacqulynn Cadet M.D.   On: 01/16/2019 08:49   Ir US Guide Vasc Access Right  Result Date: 01/16/2019 INDICATION: 73 year old female with severe peptic ulcer disease resulting in large gastric and duodenal ulcers and subsequent upper GI bleed requiring 6 unit transfusion thus far. Endoscopic therapy is limited given the size of the ulcerations. Therefore, she presents for angiogram and embolization. EXAM: IR ULTRASOUND GUIDANCE VASC ACCESS RIGHT; ADDITIONAL ARTERIOGRAPHY; SELECTIVE VISCERAL ARTERIOGRAPHY; IR EMBO ART VEN HEMORR LYMPH EXTRAV INC GUIDE ROADMAPPING 1. Ultrasound-guided vascular access right common femoral artery 2. Catheterization of the splenic artery with arteriogram 3. Catheterization of the superior mesenteric artery with arteriogram 4. Catheterization of the common  hepatic artery (arising directly from the aorta) with arteriogram 5. Catheterization of the gastroduodenal artery with arteriogram 6. Catheterization of the gastroepiploic artery with arteriogram 7. Coil and vascular plug embolization of the gastroduodenal artery MEDICATIONS: None ANESTHESIA/SEDATION: Moderate (conscious) sedation was employed during this procedure. A total of Versed 0.5 mg and Fentanyl 25 mcg was administered intravenously. Moderate Sedation Time: 23 minutes. The patient's level of consciousness and vital signs were monitored continuously by radiology nursing throughout the procedure under my direct supervision. CONTRAST:  55mL OMNIPAQUE IOHEXOL 300 MG/ML  SOLN FLUOROSCOPY TIME:  Fluoroscopy Time: 6 minutes 54 seconds (266 mGy). COMPLICATIONS: None immediate. PROCEDURE: Informed consent was obtained from the patient following explanation of the procedure, risks, benefits and alternatives. The patient understands, agrees and consents for the procedure. All questions were addressed. A time out was performed prior to the initiation of the procedure. Maximal barrier sterile technique utilized including caps, mask, sterile gowns, sterile gloves, large sterile drape, hand hygiene, and Betadine prep. The right common femoral artery was interrogated with ultrasound and found to be widely patent. An image was obtained and stored for the medical record. Local anesthesia was attained by infiltration with 1% lidocaine. A small dermatotomy was made. Under real-time sonographic guidance, the vessel was punctured with a 21 gauge micropuncture needle. Using standard technique, the initial micro needle was exchanged over a 0.018 micro wire for a transitional 4 [place] micro sheath. The micro sheath was then exchanged over a 0.035 wire for a 5 French vascular sheath. A C2 cobra catheter was advanced over a Bentson wire into the abdominal aorta. The catheter was used to select the first artery from the aorta.  Arteriography was performed. This is a splenic artery arising independently from the thoracic aorta. The artery is highly tortuous. There is  no collateral flow into the hepatic artery. No evidence of aneurysm or bleeding. C2 cobra catheter was used to select another artery arising from the aorta. Arteriography was performed. This is the superior mesenteric artery. Additionally, faint opacification of the right renal artery and a common hepatic artery arising directly from the aorta is notified on reflux. No evidence of replaced right hepatic artery or significant pancreaticoduodenal collaterals. The catheter was next used to select the common hepatic artery which arises directly from the aorta. Arteriography was performed. A large dorsal pancreatic artery is the first branch. The gastroduodenal artery is also visualized and appears slightly abnormal. Utilizing the Bentson wire, the C2 cobra catheter was used to select the gastroduodenal artery. Arteriography was performed. The mid segment of the gastroduodenal artery is highly abnormal with a beaded and irregular appearance. There is no evidence of active extravasation at this time, however the appearance of the artery suggests arterial injury and arterial spasm, likely from the overlying ulceration. This segment of artery is damaged an at high risk for continued bleeding. Therefore, a renegade ST microcatheter and Fathom wire were introduced through the 5 French catheter. The injured segment of artery was carefully crossed with the Fathom 16 wire and the renegade STC advanced into the right gastroepiploic artery. Arteriography was performed confirming that the microcatheter is indeed beyond the injured segment of the artery and in the gastroepiploic artery. Coil embolization was then performed first using a 2 x 5 x 5.8 cm vortex interlock detachable coil. Next, a 5 x 15 cm helical interlock coil and a 6 x 20 cm helical interlock coil were deployed across the  injured segment of the artery. Next, a 6 x 6.7 cm vortex push below coil was deployed. This brings is back into the more proximal and larger segment of the gastroduodenal artery. The microcatheter was removed. Final embolization was performed using a 6 x 11 mm Amplatzer vascular plug 4. The 5 French catheter was subsequently brought back into the common hepatic artery and arteriography was performed. There is no further flow within the injured segment of the gastroduodenal artery. Embolization is successful. The catheter was removed. Hemostasis was attained with the assistance of an Angio-Seal device. IMPRESSION: 1. Abnormal and injured segment of the gastroduodenal artery presumably in the region of duodenal ulceration. 2. Successful coil and a VP embolization of the injured gastroduodenal artery. 3. Variant anatomy, the common hepatic artery arises directly from the aorta. Signed, Criselda Peaches, MD, [place] [facility] Radiology Electronically Signed   By: Jacqulynn Cadet M.D.   On: 01/16/2019 08:49   Dg Chest Port 1 View  Result Date: 01/13/2019 CLINICAL DATA:  Follow-up pneumonia EXAM: PORTABLE CHEST 1 VIEW COMPARISON:  Chest radiograph, 10/12/2017, CT abdomen pelvis, 01/11/2019 FINDINGS: Mild cardiomegaly. Aortic atherosclerosis. Subtle, diffuse, scattered heterogeneous airspace opacities bilaterally. The visualized skeletal structures are unremarkable. IMPRESSION: Subtle, diffuse, scattered heterogeneous airspace opacities bilaterally, better appreciated by previous CT although most in keeping with multifocal infection. Electronically Signed   By: Eddie Candle M.D.   On: 01/13/2019 08:57   [place] Guide Roadmapping  Result Date: 01/16/2019 INDICATION: 73 year old female with severe peptic ulcer disease resulting in large gastric and duodenal ulcers and subsequent upper GI bleed requiring 6 unit transfusion thus far.  Endoscopic therapy is limited given the size of the ulcerations. Therefore, she presents for angiogram and embolization. EXAM: IR ULTRASOUND GUIDANCE VASC ACCESS RIGHT; ADDITIONAL ARTERIOGRAPHY; SELECTIVE  VISCERAL ARTERIOGRAPHY; IR EMBO ART VEN HEMORR LYMPH EXTRAV INC GUIDE ROADMAPPING 1. Ultrasound-guided vascular access right common femoral artery 2. Catheterization of the splenic artery with arteriogram 3. Catheterization of the superior mesenteric artery with arteriogram 4. Catheterization of the common hepatic artery (arising directly from the aorta) with arteriogram 5. Catheterization of the gastroduodenal artery with arteriogram 6. Catheterization of the gastroepiploic artery with arteriogram 7. Coil and vascular plug embolization of the gastroduodenal artery MEDICATIONS: None ANESTHESIA/SEDATION: Moderate (conscious) sedation was employed during this procedure. A total of Versed 0.5 mg and Fentanyl 25 mcg was administered intravenously. Moderate Sedation Time: 23 minutes. The patient's level of consciousness and vital signs were monitored continuously by radiology nursing throughout the procedure under my direct supervision. CONTRAST:  63mL OMNIPAQUE IOHEXOL 300 MG/ML  SOLN FLUOROSCOPY TIME:  Fluoroscopy Time: 6 minutes 54 seconds (266 mGy). COMPLICATIONS: None immediate. PROCEDURE: Informed consent was obtained from the patient following explanation of the procedure, risks, benefits and alternatives. The patient understands, agrees and consents for the procedure. All questions were addressed. A time out was performed prior to the initiation of the procedure. Maximal barrier sterile technique utilized including caps, mask, sterile gowns, sterile gloves, large sterile drape, hand hygiene, and Betadine prep. The right common femoral artery was interrogated with ultrasound and found to be widely patent. An image was obtained and stored for the medical record. Local anesthesia was attained by infiltration with  1% lidocaine. A small dermatotomy was made. Under real-time sonographic guidance, the vessel was punctured with a 21 gauge micropuncture needle. Using standard technique, the initial micro needle was exchanged over a 0.018 micro wire for a transitional 4 [place] micro sheath. The micro sheath was then exchanged over a 0.035 wire for a 5 French vascular sheath. A C2 cobra catheter was advanced over a Bentson wire into the abdominal aorta. The catheter was used to select the first artery from the aorta. Arteriography was performed. This is a splenic artery arising independently from the thoracic aorta. The artery is highly tortuous. There is no collateral flow into the hepatic artery. No evidence of aneurysm or bleeding. C2 cobra catheter was used to select another artery arising from the aorta. Arteriography was performed. This is the superior mesenteric artery. Additionally, faint opacification of the right renal artery and a common hepatic artery arising directly from the aorta is notified on reflux. No evidence of replaced right hepatic artery or significant pancreaticoduodenal collaterals. The catheter was next used to select the common hepatic artery which arises directly from the aorta. Arteriography was performed. A large dorsal pancreatic artery is the first branch. The gastroduodenal artery is also visualized and appears slightly abnormal. Utilizing the Bentson wire, the C2 cobra catheter was used to select the gastroduodenal artery. Arteriography was performed. The mid segment of the gastroduodenal artery is highly abnormal with a beaded and irregular appearance. There is no evidence of active extravasation at this time, however the appearance of the artery suggests arterial injury and arterial spasm, likely from the overlying ulceration. This segment of artery is damaged an at high risk for continued bleeding. Therefore, a renegade ST microcatheter and Fathom wire were introduced through the 5 French  catheter. The injured segment of artery was carefully crossed with the Fathom 16 wire and the renegade STC advanced into the right gastroepiploic artery. Arteriography was performed confirming that the microcatheter is indeed beyond the injured segment of the artery and in the gastroepiploic artery. Coil embolization was then performed first using a 2  x 5 x 5.8 cm vortex interlock detachable coil. Next, a 5 x 15 cm helical interlock coil and a 6 x 20 cm helical interlock coil were deployed across the injured segment of the artery. Next, a 6 x 6.7 cm vortex push below coil was deployed. This brings is back into the more proximal and larger segment of the gastroduodenal artery. The microcatheter was removed. Final embolization was performed using a 6 x 11 mm Amplatzer vascular plug 4. The 5 French catheter was subsequently brought back into the common hepatic artery and arteriography was performed. There is no further flow within the injured segment of the gastroduodenal artery. Embolization is successful. The catheter was removed. Hemostasis was attained with the assistance of an Angio-Seal device. IMPRESSION: 1. Abnormal and injured segment of the gastroduodenal artery presumably in the region of duodenal ulceration. 2. Successful coil and a VP embolization of the injured gastroduodenal artery. 3. Variant anatomy, the common hepatic artery arises directly from the aorta. Signed, Criselda Peaches, MD, [place] [facility] Radiology Electronically Signed   By: Jacqulynn Cadet M.D.   On: 01/16/2019 08:49       Subjective: Denies abdominal pain.  No blood in the stool. Has some liquid yellow content after passing gas, no real Bowel movement  Discharge Exam: Vitals:   01/21/19 0613 01/21/19 0829  BP: (!) 113/55 (!) 101/45  Pulse: 62 70  Resp: 18 17  Temp: 97.9 F (36.6 C) 98.2 F (36.8 C)  SpO2: 98% 96%     General: Pt is alert, awake, not in acute  distress Cardiovascular: RRR, S1/S2 +, no rubs, no gallops Respiratory: CTA bilaterally, no wheezing, no rhonchi Abdominal: Soft, NT, ND, bowel sounds + Extremities: no edema, no cyanosis    The results of significant diagnostics from this hospitalization (including imaging, microbiology, ancillary and laboratory) are listed below for reference.     Microbiology: Recent Results (from the past 240 hour(s))  SARS Coronavirus 2 [facility] order, Performed in [facility] hospital lab) Nasopharyngeal Nasopharyngeal Swab     Status: None   Collection Time: 01/11/19 10:09 PM   Specimen: Nasopharyngeal Swab  Result Value Ref Range Status   SARS Coronavirus 2 NEGATIVE NEGATIVE Final    Comment: (NOTE) If result is NEGATIVE SARS-CoV-2 target nucleic acids are NOT DETECTED. The SARS-CoV-2 RNA is generally detectable in upper and lower  respiratory specimens during the acute phase of infection. The lowest  concentration of SARS-CoV-2 viral copies this assay can detect is 250  copies / mL. A negative result does not preclude SARS-CoV-2 infection  and should not be used as the sole basis for treatment or other  patient management decisions.  A negative result may occur with  improper specimen collection / handling, submission of specimen other  than nasopharyngeal swab, presence of viral mutation(s) within the  areas targeted by this assay, and inadequate number of viral copies  (<250 copies / mL). A negative result must be combined with clinical  observations, patient history, and epidemiological information. If result is POSITIVE SARS-CoV-2 target nucleic acids are DETECTED. The SARS-CoV-2 RNA is generally detectable in upper and lower  respiratory specimens dur ing the acute phase of infection.  Positive  results are indicative of active infection with SARS-CoV-2.  Clinical  correlation with patient history and other diagnostic information is  necessary to determine patient infection  status.  Positive results do  not rule out bacterial infection or co-infection with other viruses. If result  is PRESUMPTIVE POSTIVE SARS-CoV-2 nucleic acids MAY BE PRESENT.   A presumptive positive result was obtained on the submitted specimen  and confirmed on repeat testing.  While 2019 novel coronavirus  (SARS-CoV-2) nucleic acids may be present in the submitted sample  additional confirmatory testing may be necessary for epidemiological  and / or clinical management purposes  to differentiate between  SARS-CoV-2 and other Sarbecovirus currently known to infect humans.  If clinically indicated additional testing with an alternate test  methodology [id number]) is advised. The SARS-CoV-2 RNA is generally  detectable in upper and lower respiratory sp ecimens during the acute  phase of infection. The expected result is Negative. Fact Sheet for Patients:  StrictlyIdeas.no Fact Sheet for Healthcare Providers: BankingDealers.co.za This test is not yet approved or cleared by the [place] FDA and has been authorized for detection and/or diagnosis of SARS-CoV-2 by FDA under an Emergency Use Authorization (EUA).  This EUA will remain in effect (meaning this test can be used) for the duration of the COVID-19 declaration under Section 564(b)(1) of the Act, 21 U.S.C. section 360bbb-3(b)(1), unless the authorization is terminated or revoked sooner. Performed at [facility], [place] [address] [place], [place] [address]   Expectorated sputum assessment w rflx to resp cult     Status: None   Collection Time: 01/12/19  9:50 PM   Specimen: Expectorated Sputum  Result Value Ref Range Status   Specimen Description EXPECTORATED SPUTUM  Final   Special Requests NONE  Final   Sputum evaluation   Final    Sputum specimen not acceptable for testing.  Please recollect.   RESULT CALLED TO, READ BACK BY AND VERIFIED WITH: ORP RN 01/13/19 0100  JDW Performed at [facility], [place] [address] [place], [place] [address]    Report Status 01/14/2019 FINAL  Final  MRSA PCR Screening     Status: None   Collection Time: 01/12/19 11:11 PM   Specimen: Nasopharyngeal  Result Value Ref Range Status   MRSA by PCR NEGATIVE NEGATIVE Final    Comment:        The GeneXpert MRSA Assay (FDA approved for NASAL specimens only), is one component of a comprehensive MRSA colonization surveillance program. It is not intended to diagnose MRSA infection nor to guide or monitor treatment for MRSA infections. Performed at [facility], [place] [address] [place], [place] [address]      Labs: BNP (last 3 results) No results for input(s): BNP in the last 8760 hours. Basic Metabolic Panel: Recent Labs  Lab 01/16/19 0317 01/17/19 0641 01/20/19 0049 01/20/19 1300  NA 139 141 142 141  K 3.8 3.2* 2.5* 3.1*  CL 110 110 110 109  CO2 23 23 27 25   GLUCOSE 99 100* 108* 120*  BUN 22 15 11 10   CREATININE 0.83 0.86 0.86 0.87  CALCIUM 7.2* 7.6* 7.6* 7.8*   Liver Function Tests: No results for input(s): AST, ALT, ALKPHOS, BILITOT, PROT, ALBUMIN in the last 168 hours. No results for input(s): LIPASE, AMYLASE in the last 168 hours. No results for input(s): AMMONIA in the last 168 hours. CBC: Recent Labs  Lab 01/17/19 0641  01/18/19 0435  01/19/19 0150 01/19/19 0919 01/19/19 1642 01/20/19 0049 01/20/19 0945 01/21/19 0228  WBC 7.1  --  7.2  --  6.0  --   --  5.4  --  5.7  NEUTROABS 5.1  --  4.8  --  4.0  --   --  3.6  --   --  HGB 6.6*   < > 8.1*   < > 8.1* 8.8* 8.2* 7.7* 8.8* 7.8*  HCT 20.8*   < > 24.7*   < > 25.1* 27.9* 24.9* 23.6* 27.9* 24.8*  MCV 89.3  --  89.5  --  90.0  --   --  90.1  --  91.5  PLT 205  --  216  --  227  --   --  230  --  239   < > = values in this interval not displayed.   Cardiac Enzymes: No results for input(s): CKTOTAL, CKMB, CKMBINDEX, TROPONINI in the last 168 hours. BNP: Invalid input(s):  POCBNP CBG: Recent Labs  Lab 01/18/19 1642 01/19/19 0610 01/19/19 0730 01/20/19 0633 01/21/19 0617  GLUCAP 90 92 85 93 105*   D-Dimer No results for input(s): DDIMER in the last 72 hours. Hgb A1c No results for input(s): HGBA1C in the last 72 hours. Lipid Profile No results for input(s): CHOL, HDL, LDLCALC, TRIG, CHOLHDL, LDLDIRECT in the last 72 hours. Thyroid function studies No results for input(s): TSH, T4TOTAL, T3FREE, THYROIDAB in the last 72 hours.  Invalid input(s): FREET3 Anemia work up No results for input(s): VITAMINB12, FOLATE, FERRITIN, TIBC, IRON, RETICCTPCT in the last 72 hours. Urinalysis    Component Value Date/Time   COLORURINE STRAW (A) 01/12/2019 0100   APPEARANCEUR CLEAR 01/12/2019 0100   LABSPEC 1.016 01/12/2019 0100   PHURINE 5.0 01/12/2019 0100   GLUCOSEU NEGATIVE 01/12/2019 0100   HGBUR SMALL (A) 01/12/2019 0100   BILIRUBINUR NEGATIVE 01/12/2019 0100   KETONESUR NEGATIVE 01/12/2019 0100   PROTEINUR NEGATIVE 01/12/2019 0100   UROBILINOGEN 1 06/09/2014 1531   NITRITE NEGATIVE 01/12/2019 0100   LEUKOCYTESUR NEGATIVE 01/12/2019 0100   Sepsis Labs Invalid input(s): PROCALCITONIN,  WBC,  LACTICIDVEN Microbiology Recent Results (from the past 240 hour(s))  SARS Coronavirus 2 [facility] order, Performed in [facility] hospital lab) Nasopharyngeal Nasopharyngeal Swab     Status: None   Collection Time: 01/11/19 10:09 PM   Specimen: Nasopharyngeal Swab  Result Value Ref Range Status   SARS Coronavirus 2 NEGATIVE NEGATIVE Final    Comment: (NOTE) If result is NEGATIVE SARS-CoV-2 target nucleic acids are NOT DETECTED. The SARS-CoV-2 RNA is generally detectable in upper and lower  respiratory specimens during the acute phase of infection. The lowest  concentration of SARS-CoV-2 viral copies this assay can detect is 250  copies / mL. A negative result does not preclude SARS-CoV-2 infection  and should not be used as the sole basis for treatment or  other  patient management decisions.  A negative result may occur with  improper specimen collection / handling, submission of specimen other  than nasopharyngeal swab, presence of viral mutation(s) within the  areas targeted by this assay, and inadequate number of viral copies  (<250 copies / mL). A negative result must be combined with clinical  observations, patient history, and epidemiological information. If result is POSITIVE SARS-CoV-2 target nucleic acids are DETECTED. The SARS-CoV-2 RNA is generally detectable in upper and lower  respiratory specimens dur ing the acute phase of infection.  Positive  results are indicative of active infection with SARS-CoV-2.  Clinical  correlation with patient history and other diagnostic information is  necessary to determine patient infection status.  Positive results do  not rule out bacterial infection or co-infection with other viruses. If result is PRESUMPTIVE POSTIVE SARS-CoV-2 nucleic acids MAY BE PRESENT.   A presumptive positive result was obtained on the submitted specimen  and  confirmed on repeat testing.  While 2019 novel coronavirus  (SARS-CoV-2) nucleic acids may be present in the submitted sample  additional confirmatory testing may be necessary for epidemiological  and / or clinical management purposes  to differentiate between  SARS-CoV-2 and other Sarbecovirus currently known to infect humans.  If clinically indicated additional testing with an alternate test  methodology [phone]) is advised. The SARS-CoV-2 RNA is generally  detectable in upper and lower respiratory sp ecimens during the acute  phase of infection. The expected result is Negative. Fact Sheet for Patients:  StrictlyIdeas.no Fact Sheet for Healthcare Providers: BankingDealers.co.za This test is not yet approved or cleared by the [place] FDA and has been authorized for detection and/or diagnosis of  SARS-CoV-2 by FDA under an Emergency Use Authorization (EUA).  This EUA will remain in effect (meaning this test can be used) for the duration of the COVID-19 declaration under Section 564(b)(1) of the Act, 21 U.S.C. section 360bbb-3(b)(1), unless the authorization is terminated or revoked sooner. Performed at [facility], [place] [address], [place], [place] [address]   Expectorated sputum assessment w rflx to resp cult     Status: None   Collection Time: 01/12/19  9:50 PM   Specimen: Expectorated Sputum  Result Value Ref Range Status   Specimen Description EXPECTORATED SPUTUM  Final   Special Requests NONE  Final   Sputum evaluation   Final    Sputum specimen not acceptable for testing.  Please recollect.   RESULT CALLED TO, READ BACK BY AND VERIFIED WITH: ORP RN 01/13/19 0100 JDW Performed at [facility], [place] [address], [place], [place] [address]    Report Status 01/14/2019 FINAL  Final  MRSA PCR Screening     Status: None   Collection Time: 01/12/19 11:11 PM   Specimen: Nasopharyngeal  Result Value Ref Range Status   MRSA by PCR NEGATIVE NEGATIVE Final    Comment:        The GeneXpert MRSA Assay (FDA approved for NASAL specimens only), is one component of a comprehensive MRSA colonization surveillance program. It is not intended to diagnose MRSA infection nor to guide or monitor treatment for MRSA infections. Performed at [facility], [place] [address], [place], [place] [address]      Time coordinating discharge: 40 minutes  SIGNED:   Elmarie Shiley, MD  [facility]

## 2019-01-21 NOTE — Progress Notes (Addendum)
Daily Rounding Note  01/21/2019, 11:53 AM  LOS: 10 days   SUBJECTIVE:   Chief complaint:  Bleeding DU   Feels well.  No BMs  OBJECTIVE:         Vital signs in last 24 hours:    Temp:  [97.9 F (36.6 C)-98.2 F (36.8 C)] 98.2 F (36.8 C) (09/09 0829) Pulse Rate:  [62-73] 73 (09/09 0900) Resp:  [17-20] 18 (09/09 0900) BP: (99-119)/(42-55) 99/52 (09/09 0900) SpO2:  [96 %-99 %] 96 % (09/09 0829) Weight:  [63.3 kg] 63.3 kg (09/09 0613) Last BM Date: 01/14/19 Filed Weights   01/19/19 0708 01/20/19 0423 01/21/19 [id number]  Weight: 62.1 kg 62.7 kg 63.3 kg   General: Looks well.  A bit pale. Heart: RRR. Chest: Clear bilaterally.  No labored breathing or cough. Abdomen: Not tender or distended.  Active bowel sounds. Extremities: No CCE. Neuro/Psych: Appropriate.  Follows commands.  Alert  Intake/Output from previous day: 09/08 0701 - 09/09 0700 In: 320 [P.O.:320] Out: -   Intake/Output this shift: Total I/O In: 240 [P.O.:240] Out: -   Lab Results: Recent Labs    01/19/19 0150  01/20/19 0049 01/20/19 0945 01/21/19 0228  WBC 6.0  --  5.4  --  5.7  HGB 8.1*   < > 7.7* 8.8* 7.8*  HCT 25.1*   < > 23.6* 27.9* 24.8*  PLT 227  --  230  --  239   < > = values in this interval not displayed.   BMET Recent Labs    01/20/19 0049 01/20/19 1300  NA 142 141  K 2.5* 3.1*  CL 110 109  CO2 27 25  GLUCOSE 108* 120*  BUN 11 10  CREATININE 0.86 0.87  CALCIUM 7.6* 7.8*   Scheduled Meds: . ARIPiprazole  5 mg Oral Daily  . cholecalciferol  1,000 Units Oral Daily  . donepezil  10 mg Oral QHS  . gabapentin  200 mg Oral BID  . lamoTRIgine  400 mg Oral Daily  . pantoprazole  40 mg Oral BID  . polyethylene glycol  17 g Oral Daily  . rosuvastatin  40 mg Oral Daily  . sertraline  200 mg Oral Daily  . sodium chloride flush  3 mL Intravenous Q12H  . thiamine  250 mg Oral BID   Continuous Infusions: PRN Meds:.acetaminophen  **OR** acetaminophen, clonazePAM, ondansetron **OR** ondansetron (ZOFRAN) IV   ASSESMENT:   *   Bleeding DU.  CGE and burgundy BM's.  Mobic, full dose ASA and no PPI/H2B PTA.    01/12/19 [id number], non-bleeding GUs. Bx'd. Solitary DU w adherent clot,high risk to re bleed. Explain CT findings of gastric, prox duodenal thickening.  Rebled day 2.5 of PPI gtt 01/14/19 EGD: non-bleeding GUs, non-bleeding DU w pigmented material. No evidence rebleeding.  Rebled.  01/15/2019 angiography,coil/AVP embolization of GDA. Dr. Jacqulynn Cadet Taking Mobic, full dose ASA and no PPI or H2 blocker PTA. 01/19/19 EGD:  Non-bleeding GUs.  Non-bleeding, non-obstructing DU, margins biopsied. Small HH Stool H Pylori negative.   72 hours PPI gtt 8/31-9/6.Now on Protonix 40 mg po BID.  Needs to stay on this until seen back at GI PMD office.   *   IDA anemia.  Hgb 3.8 at admission.  Ferritin 9.  S/p 7 PRBCs, last on 9/5.  feraheme on 9/3.  Oral iron PTA.  Hgb 7.7 >> 8.8 >> 7.8  *   PNA.  Finished Vanco, Rocephin.    *  Hypokalemia.    Improved but persists.  *   Dementia.   Not profound.   Assisted Living PTA.       PLAN   *   Still waiting on pathology report from DU biopsies of 2/2  *     Protonix 40 mg po BID.  Needs to stay on this until seen back at GI PMD (Dr Richmond Campbell) office.  Has appt there on Sep 15 at 1:30 PM.       *    CBC within next 7 to 10 days.       [name]  01/21/2019, 11:53 AM Phone [phone]

## 2019-01-21 NOTE — Progress Notes (Addendum)
Occupational Therapy Treatment and Discharge  Patient Details Name: Angelica Kelley MRN: [id number] DOB: 10/13/1945 Today's Date: 01/21/2019    History of present illness 73 year old Caucasian female with past medical history significant for dementia, TIA, hypertension, hyperlipidemia, diastolic congestive heart failure, atrial fibrillation, alcoholism and iron deficiency anemia.  Patient was admitted following a fall and reported hematemesis and melena stools.    OT comments  Patient progressing well.  Reports initial abdominal pain, improves with mobility. Completed grooming, LB dressing and transfers with supervision, as well as mobility in room/hallway with supervision.  Continue to recommend 24/7 support due to cognitive deficits, but this is baseline.  Met OT goals, and ready to dc back to ALF once medically appropriate per MD.  OT signing off.    Follow Up Recommendations  No OT follow up;Supervision/Assistance - 24 hour(back to ALF)    Equipment Recommendations  None recommended by OT    Recommendations for Other Services      Precautions / Restrictions Precautions Precautions: Fall Restrictions Weight Bearing Restrictions: No       Mobility Bed Mobility Overal bed mobility: Modified Independent             General bed mobility comments: no assist required  Transfers Overall transfer level: Needs assistance Equipment used: None Transfers: Sit to/from Stand Sit to Stand: Supervision         General transfer comment: for safety    Balance Overall balance assessment: Mild deficits observed, not formally tested                                         ADL either performed or assessed with clinical judgement   ADL Overall ADL's : Needs assistance/impaired     Grooming: Supervision/safety;Standing;Wash/dry hands;Oral care               Lower Body Dressing: Supervision/safety;Sit to/from stand Lower Body Dressing Details (indicate cue  type and reason): donning socks with supervision, sit to stand supervision  Toilet Transfer: Supervision/safety;Ambulation           Functional mobility during ADLs: Supervision/safety General ADL Comments: pt progressing well      Vision       Perception     Praxis      Cognition Arousal/Alertness: Awake/alert Behavior During Therapy: WFL for tasks assessed/performed Overall Cognitive Status: History of cognitive impairments - at baseline                                 General Comments: pleasant and cooperative, able to follow commands but noted poor recall         Exercises     Shoulder Instructions       General Comments BP soft 99/52 but pt asymptomatic    Pertinent Vitals/ Pain       Pain Assessment: Faces Faces Pain Scale: Hurts a little bit Pain Location: abdomen Pain Descriptors / Indicators: Discomfort Pain Intervention(s): Monitored during session;Repositioned  Home Living                                          Prior Functioning/Environment              Frequency  Min 2X/week  Progress Toward Goals  OT Goals(current goals can now be found in the care plan section)  Progress towards OT goals: Goals met/education completed, patient discharged from OT  Acute Rehab OT Goals Patient Stated Goal: back home  OT Goal Formulation: With patient  Plan All goals met and education completed, patient discharged from OT services;Discharge plan remains appropriate    Co-evaluation                 AM-PAC OT "6 Clicks" Daily Activity     Outcome Measure   Help from another person eating meals?: None Help from another person taking care of personal grooming?: None Help from another person toileting, which includes using toliet, bedpan, or urinal?: None Help from another person bathing (including washing, rinsing, drying)?: A Little Help from another person to put on and taking off regular upper body  clothing?: None Help from another person to put on and taking off regular lower body clothing?: A Little 6 Click Score: 22    End of Session    OT Visit Diagnosis: Unsteadiness on feet (R26.81);Muscle weakness (generalized) (M62.81);Pain Pain - part of body: (abdomen)   Activity Tolerance Patient tolerated treatment well   Patient Left in bed;with call bell/phone within reach   Nurse Communication Mobility status        Time: 9417-4081 OT Time Calculation (min): 19 min  Charges: OT General Charges $OT Visit: 1 Visit OT Treatments $Self Care/Home Management : 8-22 mins  Delight Stare, OT Acute Rehabilitation Services Pager [phone] Office [phone]    Delight Stare 01/21/2019, 10:02 AM

## 2019-01-21 NOTE — Care Management Important Message (Signed)
Important Message  Patient Details  Name: Angelica Kelley MRN: [id number] Date of Birth: 03-11-1946   Medicare Important Message Given:  Yes     Shelda Altes 01/21/2019, 12:09 PM

## 2019-01-26 ENCOUNTER — Telehealth: Payer: Self-pay | Admitting: Gastroenterology

## 2019-01-26 DIAGNOSIS — Z79899 Other long term (current) drug therapy: Secondary | ICD-10-CM | POA: Diagnosis not present

## 2019-01-26 DIAGNOSIS — D649 Anemia, unspecified: Secondary | ICD-10-CM | POA: Diagnosis not present

## 2019-01-26 DIAGNOSIS — K269 Duodenal ulcer, unspecified as acute or chronic, without hemorrhage or perforation: Secondary | ICD-10-CM | POA: Diagnosis not present

## 2019-01-26 DIAGNOSIS — D500 Iron deficiency anemia secondary to blood loss (chronic): Secondary | ICD-10-CM | POA: Diagnosis not present

## 2019-01-26 DIAGNOSIS — K257 Chronic gastric ulcer without hemorrhage or perforation: Secondary | ICD-10-CM | POA: Diagnosis not present

## 2019-01-26 NOTE — Telephone Encounter (Signed)
Spoke to nurse [place] at [facility] and clarified lab orders she will send over a form to get signed by the doctor.

## 2019-01-27 ENCOUNTER — Encounter: Payer: Self-pay | Admitting: Gastroenterology

## 2019-01-27 DIAGNOSIS — Z79899 Other long term (current) drug therapy: Secondary | ICD-10-CM | POA: Diagnosis not present

## 2019-01-27 DIAGNOSIS — E876 Hypokalemia: Secondary | ICD-10-CM | POA: Diagnosis not present

## 2019-01-28 ENCOUNTER — Encounter: Payer: Self-pay | Admitting: *Deleted

## 2019-01-28 ENCOUNTER — Telehealth: Payer: Self-pay | Admitting: *Deleted

## 2019-01-28 NOTE — Telephone Encounter (Signed)
Spoke with the patient's niece Jilda Panda. The patient is a resident at [facility] and has mild dementia. The patient has been scheduled for the following with both [facility] and Ms. [name].    02/19/2019 at 11:10 am 4 week f/u office visit with Dr. Tarri Glenn  02/26/2019 at 10:30 am pre-op visit, TELEPHONE (exeption made due to patient being in a nursing home and having mild dementia). Speak with patient's niece Jilda Panda, [place] in [place] [phone]).  03/12/2019 at 2:00 pm (nursing home requested Thursday, residents have transportation on Tuesday's and Thursday's. 8 week repeat EGD.   Ms. Tina Griffiths reported there are NP's that see patient's and do blood draws. She was going to check to see if they could do the blood draw for the fasting serum gastrin and CBC. If this could not be done, she was going to bring the patient to the [facility] lab.

## 2019-02-02 DIAGNOSIS — D649 Anemia, unspecified: Secondary | ICD-10-CM | POA: Diagnosis not present

## 2019-02-02 DIAGNOSIS — Z79899 Other long term (current) drug therapy: Secondary | ICD-10-CM | POA: Diagnosis not present

## 2019-02-02 DIAGNOSIS — D6489 Other specified anemias: Secondary | ICD-10-CM | POA: Diagnosis not present

## 2019-02-02 DIAGNOSIS — F5109 Other insomnia not due to a substance or known physiological condition: Secondary | ICD-10-CM | POA: Diagnosis not present

## 2019-02-05 ENCOUNTER — Other Ambulatory Visit: Payer: Self-pay

## 2019-02-05 ENCOUNTER — Encounter: Payer: Self-pay | Admitting: Cardiology

## 2019-02-05 ENCOUNTER — Ambulatory Visit (INDEPENDENT_AMBULATORY_CARE_PROVIDER_SITE_OTHER): Payer: Medicare Other | Admitting: Cardiology

## 2019-02-05 VITALS — BP 132/68 | HR 86 | Ht 66.0 in | Wt 133.4 lb

## 2019-02-05 DIAGNOSIS — G300 Alzheimer's disease with early onset: Secondary | ICD-10-CM

## 2019-02-05 DIAGNOSIS — I480 Paroxysmal atrial fibrillation: Secondary | ICD-10-CM

## 2019-02-05 DIAGNOSIS — E785 Hyperlipidemia, unspecified: Secondary | ICD-10-CM

## 2019-02-05 DIAGNOSIS — F02818 Dementia in other diseases classified elsewhere, unspecified severity, with other behavioral disturbance: Secondary | ICD-10-CM

## 2019-02-05 DIAGNOSIS — D649 Anemia, unspecified: Secondary | ICD-10-CM

## 2019-02-05 DIAGNOSIS — F0281 Dementia in other diseases classified elsewhere with behavioral disturbance: Secondary | ICD-10-CM

## 2019-02-05 DIAGNOSIS — I350 Nonrheumatic aortic (valve) stenosis: Secondary | ICD-10-CM | POA: Diagnosis not present

## 2019-02-05 NOTE — Progress Notes (Signed)
Cardiology Office Note:    Date:  02/05/2019   ID:  Angelica Kelley, DOB 03/16/1946, MRN [id number]  PCP:  Orvis Brill, [facility]  Cardiologist:  Mertie Moores, MD  Referring MD: No ref. provider found   Chief Complaint  Patient presents with  . Follow-up  . Atrial Fibrillation    History of Present Illness:    Angelica Kelley is a 73 y.o. female with a past medical history significant for hypertension, hyperlipidemia, GERD, bipolar, TIA, prediabetes, murmur, alcoholism, IBS, subdural hematoma 06/2016.  She was hospitalized in 08/2017 with altered mental status after being found on the floor for an unknown length of time and being positive for rhabdomyolysis.  She ended up having some diastolic heart failure after IV fluids were given.  She was also noted to have paroxysmal atrial fibrillation on telemetry with only bursts of A. fib lasting a few seconds to over an hour.  All spontaneously converted to sinus rhythm.  Troponins were mildly elevated felt to be related to brief episodes of A. fib and also rhabdomyolysis, demand ischemia.  She was also noted to have possible benzodiazepine overdose superimposed on progressing dementia.  I last saw her in the office on 09/20/2017 for hospital follow-up.  She was doing well at the time.  Over the past 2 months she has been having GI issues: acute duodenal ulcer with hemorrhage, recent GI bleed due to large NSAID-related DU s/p GDA embolization 01/15/19. At least 15 gastric ulcers also seen, but without stigmata of bleeding. She required 8 units of PRBCs. She was on full dose aspirin and daily mobic. These were stopped.   Her daughter is here with her and reports that she thinks the patient is a little short of breath, however, due to restrictions on visitors at the assisted living facility, she has not really been around her mother very much.  The patient has a very poor memory and is unable to provide much information at all.  She is pleasant and  denies any current chest discomfort or shortness of breath.  She is a little bit wobbly when walking across the exam room, but overall looks to be doing fairly well.   The patient lives at [facility] assisted living  She has a planned GI follow-up on 10/8.  The daughter thinks that labs are to be done at some point at the assisted living facility.   Cardiac studies    ECHO 08/20/17 Study Conclusions - Left ventricle: The cavity size was normal. Wall thickness was increased in a pattern of mild LVH. Systolic function was normal. The estimated ejection fraction was in the range of 60% to 65%. Doppler parameters are consistent with pseudonormal left ventricular relaxation (grade 2 diastolic dysfunction). The E/e&' ratio is >15, suggesting elevated LV filling pressure. - Aortic valve: Trileaflet; mildly calcified leaflets. Mild stenosis. Trivial regurgitation. Mean gradient (S): 16 mm Hg. Peak gradient (S): 34 mm Hg. Valve area (VTI): 1.75 cm^2. Valve area (Vmax): 1.43 cm^2. Valve area (Vmean): 1.54 cm^2. - Mitral valve: Mildly thickened leaflets . There was mild regurgitation. - Left atrium: Severely dilated. The atrium was normal in size. - Right ventricle: The cavity size was mildly dilated. Systolic function was normal. - Right atrium: Moderately dilated. - Tricuspid valve: There was moderate regurgitation. - Pulmonary arteries: PA peak pressure: 46 mm Hg (S). - Inferior vena cava: The vessel was normal in size. The respirophasic diameter changes were in the normal range (>= 50%), consistent with normal central venous  pressure.    Past Medical History:  Diagnosis Date  . Alcoholism ([place])   . Anemia 01/12/2019   ACUTE BLOOD LOSS   . Anxiety   . Atrial fibrillation ([place]) 09/07/2017   CHADS2Vasc of 4  Patient is not a candidate for anticoagulation at present primarily due to her inability to care for self at home and unwillingness to allow  supervision (an ongong issue, APS has been involved).  If this were to change (I.e. She were to enter assisted living or have live in aide), considerations may change, and anticoagulation would be recommended.    She is alternately on ASA 32  . CHF (congestive heart failure) ([place])   . Depression   . Elevated hemoglobin A1c   . Fracture of right wrist   . GERD (gastroesophageal reflux disease)   . Heart murmur   . Hyperlipidemia   . Hypertension   . IBS (irritable bowel syndrome)   . TIA (transient ischemic attack) 06/18/2017    Past Surgical History:  Procedure Laterality Date  . ABDOMINAL HYSTERECTOMY  1991  . APPENDECTOMY    . BIOPSY  01/12/2019   Procedure: BIOPSY;  Surgeon: Thornton Park, MD;  Location: [place];  Service: Gastroenterology;;  . BIOPSY  01/19/2019   Procedure: BIOPSY;  Surgeon: Thornton Park, MD;  Location: [place];  Service: Gastroenterology;;  . BREAST BIOPSY Right 05/2016   fat necrosis  . BREAST SURGERY Left 1989   [facility]  . CARPAL TUNNEL RELEASE Right 10/18/2014   Procedure: CARPAL TUNNEL RELEASE;  Surgeon: Dorna Leitz, MD;  Location: [place];  Service: Orthopedics;  Laterality: Right;  . [place]  . ESOPHAGOGASTRODUODENOSCOPY  01/12/2019  . ESOPHAGOGASTRODUODENOSCOPY Left 01/12/2019   Procedure: ESOPHAGOGASTRODUODENOSCOPY (EGD);  Surgeon: Thornton Park, MD;  Location: [place];  Service: Gastroenterology;  Laterality: Left;  . ESOPHAGOGASTRODUODENOSCOPY (EGD) WITH PROPOFOL N/A 01/14/2019   Procedure: ESOPHAGOGASTRODUODENOSCOPY (EGD) WITH PROPOFOL;  Surgeon: Thornton Park, MD;  Location: [place];  Service: Gastroenterology;  Laterality: N/A;  . ESOPHAGOGASTRODUODENOSCOPY (EGD) WITH PROPOFOL N/A 01/19/2019   Procedure: ESOPHAGOGASTRODUODENOSCOPY (EGD) WITH PROPOFOL;  Surgeon: Thornton Park, MD;  Location: [place];  Service: Gastroenterology;  Laterality: N/A;  . IR ANGIOGRAM SELECTIVE EACH ADDITIONAL VESSEL   01/15/2019  . IR ANGIOGRAM SELECTIVE EACH ADDITIONAL VESSEL  01/15/2019  . IR ANGIOGRAM VISCERAL SELECTIVE  01/15/2019  . IR ANGIOGRAM VISCERAL SELECTIVE  01/15/2019  . IR ANGIOGRAM VISCERAL SELECTIVE  01/15/2019  . IR EMBO ART  VEN HEMORR LYMPH EXTRAV  INC GUIDE ROADMAPPING  01/15/2019  . IR US GUIDE VASC ACCESS RIGHT  01/15/2019  . NECK SURGERY  Remote    Ruptured disc, per patient. Got infected.   . ORIF WRIST FRACTURE Right 10/18/2014   Procedure: OPEN REDUCTION INTERNAL FIXATION (ORIF) RIGHT WRIST FRACTURE;  Surgeon: Dorna Leitz, MD;  Location: [place];  Service: Orthopedics;  Laterality: Right;  . TONSILLECTOMY  1965  . TUBAL LIGATION      Current Medications: Current Meds  Medication Sig  . acetaminophen (TYLENOL) 325 MG tablet Take 650 mg by mouth every 6 (six) hours as needed for moderate pain.  . ARIPiprazole (ABILIFY) 5 MG tablet Take 5 mg by mouth daily.  . cholecalciferol (VITAMIN D) 25 MCG (1000 UT) tablet Take 1,000 Units by mouth daily.  . clonazePAM (KLONOPIN) 0.5 MG tablet Take 0.5 mg by mouth daily as needed for anxiety.  . donepezil (ARICEPT) 10 MG tablet Take 10 mg by mouth at bedtime.  . gabapentin (NEURONTIN)  100 MG capsule Take 200 mg by mouth 2 (two) times daily.  . Hypromellose (ARTIFICIAL TEARS) 0.4 % SOLN Apply 2 drops to eye 2 (two) times a day.  . lamoTRIgine (LAMICTAL) 200 MG tablet Take 400 mg by mouth daily.   . magnesium citrate (EQ MAGNESIUM CITRATE) SOLN Take 150 mLs by mouth daily as needed for mild constipation or severe constipation.  . Menthol, Topical Analgesic, (BIOFREEZE) 4 % GEL Apply 1 application topically 2 (two) times a day. Left shoulder pain  . mirabegron ER (MYRBETRIQ) 25 MG TB24 tablet Take 25 mg by mouth daily.  . pantoprazole (PROTONIX) 40 MG tablet Take 1 tablet (40 mg total) by mouth 2 (two) times daily.  . polyethylene glycol (MIRALAX / GLYCOLAX) 17 g packet Take 17 g by mouth daily.  . rosuvastatin (CRESTOR) 40 MG tablet Take 40 mg by mouth  daily.  . sennosides-docusate sodium (SENOKOT-S) 8.6-50 MG tablet Take 1 tablet by mouth daily.  . sertraline (ZOLOFT) 100 MG tablet Take 200 mg by mouth daily.   Marland Kitchen thiamine (VITAMIN B-1) 100 MG tablet Take 250 mg by mouth 2 (two) times a day.  . zolpidem (AMBIEN) 5 MG tablet Take 5 mg by mouth at bedtime.     Allergies:   Lipitor [atorvastatin] and Prednisone   Social History   Socioeconomic History  . Marital status: Divorced    Spouse name: Not on file  . Number of children: Not on file  . Years of education: Not on file  . Highest education level: Not on file  Occupational History  . Occupation: Retired  Scientific laboratory technician  . Financial resource strain: Not on file  . Food insecurity    Worry: Not on file    Inability: Not on file  . Transportation needs    Medical: Not on file    Non-medical: Not on file  Tobacco Use  . Smoking status: Former Smoker    Types: Cigarettes    Quit date: 05/14/2010    Years since quitting: 8.7  . Smokeless tobacco: Never Used  . Tobacco comment: second hand smoke exposure also  Substance and Sexual Activity  . Alcohol use: Not Currently    Comment: Quit 2002, denies she ever abused it.  . Drug use: No  . Sexual activity: Not Currently  Lifestyle  . Physical activity    Days per week: Not on file    Minutes per session: Not on file  . Stress: Not on file  Relationships  . Social Herbalist on phone: Not on file    Gets together: Not on file    Attends religious service: Not on file    Active member of club or organization: Not on file    Attends meetings of clubs or organizations: Not on file    Relationship status: Not on file  Other Topics Concern  . Not on file  Social History Narrative   07/05/17 Pt's niece, phone number [phone]. Pt lives alone at home, and doesn't use a cane or walker, is still pretty active. Never smoker.    1 daughter- deceased   Education- 80   Retired from [employer]   Caffeine- coffee,1  daily, soda 1 daily     Family History: The patient's  family history includes Cancer in her brother; Diabetes in her mother; Heart disease in her brother and mother; Leukemia in her father; Parkinson's disease in her brother. ROS:   Please see the history of present illness.  All other systems reviewed and are negative.   EKG:  EKG is not ordered today.    Recent Labs: 01/11/2019: ALT 8 01/12/2019: Magnesium 2.3 01/20/2019: BUN 10; Creatinine, Ser 0.87; Potassium 3.1; Sodium 141 01/21/2019: Hemoglobin 7.8; Platelets 239   Recent Lipid Panel    Component Value Date/Time   CHOL 174 08/07/2017 1528   TRIG 113 08/07/2017 1528   HDL 70 08/07/2017 1528   CHOLHDL 2.5 08/07/2017 1528   VLDL 25 05/10/2017 0442   LDLCALC 83 08/07/2017 1528    Physical Exam:    VS:  BP 132/68   Pulse 86   Ht 5\' 6"  (1.676 m)   Wt 133 lb 6.4 oz (60.5 kg)   SpO2 (!) 85%   BMI 21.53 kg/m     Wt Readings from Last 6 Encounters:  02/05/19 133 lb 6.4 oz (60.5 kg)  01/21/19 139 lb 8 oz (63.3 kg)  11/18/17 140 lb (63.5 kg)  10/15/17 141 lb (64 kg)  09/20/17 129 lb 12.8 oz (58.9 kg)  09/16/17 130 lb 6.4 oz (59.1 kg)     Physical Exam  Constitutional: She is oriented to person, place, and time. She appears well-developed and well-nourished. No distress.  HENT:  Head: Normocephalic and atraumatic.  Neck: Normal range of motion. Neck supple. No JVD present.  Cardiovascular: Normal rate, regular rhythm and intact distal pulses. Exam reveals no gallop and no friction rub.  Murmur heard.  Harsh midsystolic murmur is present with a grade of 3/6 at the upper right sternal border radiating to the neck. Pulmonary/Chest: Effort normal and breath sounds normal. No respiratory distress. She has no wheezes. She has no rales.  Abdominal: Soft. Bowel sounds are normal.  Musculoskeletal: Normal range of motion.        General: No edema.  Neurological: She is alert and oriented to person, place, and time.  Skin:  Skin is warm and dry.  Psychiatric: She has a normal mood and affect. Her behavior is normal. Judgment and thought content normal.  Vitals reviewed.    ASSESSMENT:    1. Paroxysmal atrial fibrillation (HCC)   2. Aortic valve stenosis, etiology of cardiac valve disease unspecified   3. Anemia, unspecified type   4. Hyperlipidemia, unspecified hyperlipidemia type   5. Early onset Alzheimer's disease with behavioral disturbance ([place])    PLAN:    In order of problems listed above:  Paroxysmal atrial fibrillation -Occurred 08/2017 in the setting of rhabdomyolysis and hypokalemia after a fall with prolonged downtime. -EKG from 01/11/2019 indicates sinus rhythm at 71 bpm. -Patient has significant fall history with multiple falls and subdural hematoma in 06/2017.  Patient also had severe GI bleed last month requiring transfusion of 8 units of blood.  Not a candidate for anticoagulation.  No longer on aspirin.  Aortic stenosis -Mild by echo in 10/2017.  Patient has questionable increasing shortness of breath per daughter, however this could be related to her recent severe anemia.  I will arrange for an echocardiogram to reevaluate her aortic valve and also check a CBC to make sure that her hemoglobin is continuing to be stable. -Patient would not be a good surgical candidate.  Probably not even for TAVR but knowing her valve status could help guide therapy.   Anemia -Patient with recent GI bleed with hemoglobin down to 3, requiring 8 units of blood transfused. -Patient with possible mild shortness of breath.  Post treatment hemoglobin was 7.8.  She has an appointment with the GI physician on  10/8.  I will check a CBC today make sure that her hemoglobin is remaining stable.  Hyperlipidemia -Rosuvastatin 40 mg daily.  Lipid panel in 07/2017 with LDL of 83.  Adequately controlled.  Advanced dementia -Very poor memory with history of frequent falls. -Lives at assisted living facility    Medication Adjustments/Labs and Tests Ordered: Current medicines are reviewed at length with the patient today.  Concerns regarding medicines are outlined above. Labs and tests ordered and medication changes are outlined in the patient instructions below:  Patient Instructions  Medication Instructions:  Your physician recommends that you continue on your current medications as directed. Please refer to the Current Medication list given to you today.' If you need a refill on your cardiac medications before your next appointment, please call your pharmacy.   Lab work: TODAY: CBC   If you have labs (blood work) drawn today and your tests are completely normal, you will receive your results only by: Marland Kitchen MyChart Message (if you have MyChart) OR . A paper copy in the mail If you have any lab test that is abnormal or we need to change your treatment, we will call you to review the results.  Testing/Procedures: Your physician has requested that you have an echocardiogram. Echocardiography is a painless test that uses sound waves to create images of your heart. It provides your doctor with information about the size and shape of your heart and how well your heart's chambers and valves are working. This procedure takes approximately one hour. There are no restrictions for this procedure.    Follow-Up: At [facility], you and your health needs are our priority.  As part of our continuing mission to provide you with exceptional heart care, we have created designated Provider Care Teams.  These Care Teams include your primary Cardiologist (physician) and Advanced Practice Providers (APPs -  Physician Assistants and Nurse Practitioners) who all work together to provide you with the care you need, when you need it. You will need a follow up appointment in:  6 months.  Please call our office 2 months in advance to schedule this appointment.  You may see Mertie Moores, MD or one of the following Advanced  Practice Providers on your designated Care Team: Richardson Dopp, PA-C [place], [place] . Daune Perch, NP  Any Other Special Instructions Will Be Listed Below (If Applicable).  Weigh at least 3 times a week. Call our office if your weight goes up 3 pounds in a day or 5 pounds in a week or if you have increased shortness of breath or swelling [phone]    Signed, Daune Perch, NP  02/05/2019 4:46 PM    [facility]

## 2019-02-05 NOTE — Patient Instructions (Addendum)
Medication Instructions:  Your physician recommends that you continue on your current medications as directed. Please refer to the Current Medication list given to you today.' If you need a refill on your cardiac medications before your next appointment, please call your pharmacy.   Lab work: TODAY: CBC   If you have labs (blood work) drawn today and your tests are completely normal, you will receive your results only by: Marland Kitchen MyChart Message (if you have MyChart) OR . A paper copy in the mail If you have any lab test that is abnormal or we need to change your treatment, we will call you to review the results.  Testing/Procedures: Your physician has requested that you have an echocardiogram. Echocardiography is a painless test that uses sound waves to create images of your heart. It provides your doctor with information about the size and shape of your heart and how well your heart's chambers and valves are working. This procedure takes approximately one hour. There are no restrictions for this procedure.    Follow-Up: At [facility], you and your health needs are our priority.  As part of our continuing mission to provide you with exceptional heart care, we have created designated Provider Care Teams.  These Care Teams include your primary Cardiologist (physician) and Advanced Practice Providers (APPs -  Physician Assistants and Nurse Practitioners) who all work together to provide you with the care you need, when you need it. You will need a follow up appointment in:  6 months.  Please call our office 2 months in advance to schedule this appointment.  You may see Mertie Moores, MD or one of the following Advanced Practice Providers on your designated Care Team: Richardson Dopp, PA-C [place], [place] . Daune Perch, NP  Any Other Special Instructions Will Be Listed Below (If Applicable).  Weigh at least 3 times a week. Call our office if your weight goes up 3 pounds in a day or 5 pounds in  a week or if you have increased shortness of breath or swelling [phone])

## 2019-02-06 LAB — CBC
Hematocrit: 31.5 % — ABNORMAL LOW (ref 34.0–46.6)
Hemoglobin: 9.8 g/dL — ABNORMAL LOW (ref 11.1–15.9)
MCH: 26.8 pg (ref 26.6–33.0)
MCHC: 31.1 g/dL — ABNORMAL LOW (ref 31.5–35.7)
MCV: 86 fL (ref 79–97)
Platelets: 229 10*3/uL (ref 150–450)
RBC: 3.65 x10E6/uL — ABNORMAL LOW (ref 3.77–5.28)
RDW: 14.2 % (ref 11.7–15.4)
WBC: 5.9 10*3/uL (ref 3.4–10.8)

## 2019-02-09 ENCOUNTER — Telehealth: Payer: Self-pay | Admitting: Gastroenterology

## 2019-02-09 NOTE — Telephone Encounter (Signed)
Thanks for the follow-up. Fasting gastrin level needs drawn. This is not an urgent study but it should still be performed. Thanks.

## 2019-02-09 NOTE — Telephone Encounter (Signed)
FYI- Spoke to Ms. [name] who reports the call came from a different physician, Dr. Isidore Moos, not Dr. Tarri Glenn. She did want to reports that cardiology did draw labs for a CBC which resulted but the serum gastrin that was requested, has not been drawn by the nursing staff and the residential care center.

## 2019-02-10 ENCOUNTER — Ambulatory Visit
Admission: RE | Admit: 2019-02-10 | Discharge: 2019-02-10 | Disposition: A | Discharge status: Home / Self Care | Payer: Medicare Other | Source: Ambulatory Visit | Attending: Internal Medicine | Admitting: Internal Medicine

## 2019-02-10 ENCOUNTER — Other Ambulatory Visit: Payer: Self-pay

## 2019-02-10 ENCOUNTER — Other Ambulatory Visit: Payer: Self-pay | Admitting: Gerontology

## 2019-02-10 DIAGNOSIS — Z1231 Encounter for screening mammogram for malignant neoplasm of breast: Secondary | ICD-10-CM

## 2019-02-10 DIAGNOSIS — H40013 Open angle with borderline findings, low risk, bilateral: Secondary | ICD-10-CM | POA: Diagnosis not present

## 2019-02-10 DIAGNOSIS — H40033 Anatomical narrow angle, bilateral: Secondary | ICD-10-CM | POA: Diagnosis not present

## 2019-02-10 DIAGNOSIS — H2513 Age-related nuclear cataract, bilateral: Secondary | ICD-10-CM | POA: Diagnosis not present

## 2019-02-10 DIAGNOSIS — H25013 Cortical age-related cataract, bilateral: Secondary | ICD-10-CM | POA: Diagnosis not present

## 2019-02-11 DIAGNOSIS — Z79899 Other long term (current) drug therapy: Secondary | ICD-10-CM | POA: Diagnosis not present

## 2019-02-16 ENCOUNTER — Ambulatory Visit: Payer: Medicare Other | Admitting: Gastroenterology

## 2019-02-17 DIAGNOSIS — D500 Iron deficiency anemia secondary to blood loss (chronic): Secondary | ICD-10-CM | POA: Diagnosis not present

## 2019-02-17 DIAGNOSIS — Z79899 Other long term (current) drug therapy: Secondary | ICD-10-CM | POA: Diagnosis not present

## 2019-02-19 ENCOUNTER — Encounter: Payer: Self-pay | Admitting: Gastroenterology

## 2019-02-19 ENCOUNTER — Ambulatory Visit: Payer: Medicare Other | Admitting: Gastroenterology

## 2019-02-19 ENCOUNTER — Other Ambulatory Visit (INDEPENDENT_AMBULATORY_CARE_PROVIDER_SITE_OTHER): Payer: Medicare Other

## 2019-02-19 VITALS — BP 96/54 | HR 65 | Temp 98.3°F | Ht 66.0 in | Wt 129.1 lb

## 2019-02-19 DIAGNOSIS — K269 Duodenal ulcer, unspecified as acute or chronic, without hemorrhage or perforation: Secondary | ICD-10-CM

## 2019-02-19 DIAGNOSIS — D500 Iron deficiency anemia secondary to blood loss (chronic): Secondary | ICD-10-CM | POA: Diagnosis not present

## 2019-02-19 LAB — CBC WITH DIFFERENTIAL/PLATELET
Basophils Absolute: 0 10*3/uL (ref 0.0–0.1)
Basophils Relative: 0.7 % (ref 0.0–3.0)
Eosinophils Absolute: 0.1 10*3/uL (ref 0.0–0.7)
Eosinophils Relative: 2.2 % (ref 0.0–5.0)
HCT: 33.4 % — ABNORMAL LOW (ref 36.0–46.0)
Hemoglobin: 10.9 g/dL — ABNORMAL LOW (ref 12.0–15.0)
Lymphocytes Relative: 17 % (ref 12.0–46.0)
Lymphs Abs: 1.1 10*3/uL (ref 0.7–4.0)
MCHC: 32.5 g/dL (ref 30.0–36.0)
MCV: 80.4 fl (ref 78.0–100.0)
Monocytes Absolute: 0.5 10*3/uL (ref 0.1–1.0)
Monocytes Relative: 7.6 % (ref 3.0–12.0)
Neutro Abs: 4.5 10*3/uL (ref 1.4–7.7)
Neutrophils Relative %: 72.5 % (ref 43.0–77.0)
Platelets: 174 10*3/uL (ref 150.0–400.0)
RBC: 4.16 Mil/uL (ref 3.87–5.11)
RDW: 17.4 % — ABNORMAL HIGH (ref 11.5–15.5)
WBC: 6.3 10*3/uL (ref 4.0–10.5)

## 2019-02-19 LAB — IRON: Iron: 33 ug/dL — ABNORMAL LOW (ref 42–145)

## 2019-02-19 LAB — FERRITIN: Ferritin: 12.9 ng/mL (ref 10.0–291.0)

## 2019-02-19 NOTE — Progress Notes (Signed)
Referring Provider: Merlene Laughter, MD Primary Care Physician:  [facility]call[facility], [facility]  Chief complaint:  Hospital follow-up   IMPRESSION:  NSAID-induced Bleeding gastric ulcers and large duodenal ulcer on EGD 01/12/19 and 01/14/19 when hospitalized with GI blood loss anemia. Gastric biopsies were negative for H pylori. Heaped margins on the duodenal ulcer had me concerned for possible malignancy. Biopsies were consistent with ulcer.   Hemoglobin appears to be stabilizing. Recent gastrin is elevated at 196, but as expected in the setting of PPI use. Will plan to follow-up with a fastin gastrin level after she is off PPI therapy.   PLAN: Resume Ferrousul Tablets 10 mg QHS Continue pantoprazole 40 mg BID Avoid all NSAIDs CBC, ferritin, iron EGD 03/12/19   Please see the "Patient Instructions" section for addition details about the plan.  HPI: Angelica Kelley is a 73 y.o. female . Her niece accompanies her to this appointment. She has dementia,TIA, hypertension, hyperlipidemia, diastolic congestive heart failure, atrial fibrillation, alcoholism and iron deficiency anemia. She was admitted in August 2020 following a fall and reported hematemesis and melena stools. On presentation to the hospital, hemoglobin was documented as 3.5 g/dL. Hemoglobin was 10 g/dL on discharge.   EGD 01/12/19:  At least 15 non-bleeding cratered gastric ulcers with no stigmata of bleeding were found in the gastric body and in the gastric antrum. The largest lesion was 6 mm in largest dimension. There is a mass like area in the duoenal bulb adjacent to one non-bleeding cratered duodenal ulcer with adherent clot was found in the duodenal bulb. Gastric biopsies were negative for H pylori.  EGD 01/14/19:  At least 15 non-bleeding cratered gastric ulcers with no stigmata of bleeding were found in the gastric body and in the gastric antrum. The largest was 6mm.  One large non-obstructing non-bleeding cratered  duodenal ulcer with pigmented material consumed over half the circumference of the duodenal bulb. The margin at the duodenal sweep appears slightly heaped and was friable with the passage of the gastroscope. Small hiatal hernia.  EGD 01/19/19: Many (>10) non-bleeding cratered gastric ulcers with no stigmata of bleeding were found in the gastric body and in the gastric antrum. The largest lesion was 4 mm in largest dimension. Scattered erosions were also present. No blood present. One non-obstructing non-bleeding cratered duodenal ulcer with pigmented material was found consuming almost the entire duodenal bulb. The margins of the ulcer appeared heaped and were biopsied with cold forceps. Biopsies were consistent with ulcer. No malignancy seen.   She notes that she had significant abdominal pain before her hospitalization.  Pain initially persisted after her hospitalization, but has since improved.  She continued on Mobic daily for left shoulder pain until it was discontinued September 10th. Not on any iron since then, either.  Continues on Protonix 40 mg twice daily.   Lifetime history of constipation that has not improved.  She is having a bowel movement every other day. Incomplete evacuation. Notes small sized stools like her pinky.   Labs 01/20/19: gastrin 196 Labs 01/21/19: hemoglobin 7.8, MCV 91.5, RDW 15.4 Labs 01/27/19: normal CMP with BUN 11, creatinine 0.6, WBC 6.5, hgb 10, platelets 270, MCV 102, RDW 15 Labs 02/05/19: hgb 9.8, MCV 86, RDW 14.2  No known family history of colon cancer or polyps. No family history of uterine/endometrial cancer, pancreatic cancer or gastric/stomach cancer.   Past Medical History:  Diagnosis Date   Alcoholism (Uncertain)    Anemia 01/12/2019   ACUTE BLOOD LOSS  Anxiety    Atrial fibrillation ([place]) 09/07/2017   CHADS2Vasc of 4  Patient is not a candidate for anticoagulation at present primarily due to her inability to care for self at home and unwillingness to  allow supervision (an ongong issue, APS has been involved).  If this were to change (I.e. She were to enter assisted living or have live in aide), considerations may change, and anticoagulation would be recommended.    She is alternately on ASA 32   CHF (congestive heart failure) (HCC)    Depression    Elevated hemoglobin A1c    Fracture of right wrist    GERD (gastroesophageal reflux disease)    Heart murmur    Hyperlipidemia    Hypertension    IBS (irritable bowel syndrome)    TIA (transient ischemic attack) 06/18/2017    Past Surgical History:  Procedure Laterality Date   ABDOMINAL HYSTERECTOMY  1991   APPENDECTOMY     BIOPSY  01/12/2019   Procedure: BIOPSY;  Surgeon: Thornton Park, MD;  Location: [facility];  Service: Gastroenterology;;   BIOPSY  01/19/2019   Procedure: BIOPSY;  Surgeon: Thornton Park, MD;  Location: [place];  Service: Gastroenterology;;   BREAST BIOPSY Right 05/2016   fat necrosis   BREAST SURGERY Left 1989   [facility]   CARPAL TUNNEL RELEASE Right 10/18/2014   Procedure: CARPAL TUNNEL RELEASE;  Surgeon: Dorna Leitz, MD;  Location: [facility];  Service: Orthopedics;  Laterality: Right;   CERVICAL LAMINECTOMY  1977   ESOPHAGOGASTRODUODENOSCOPY  01/12/2019   ESOPHAGOGASTRODUODENOSCOPY Left 01/12/2019   Procedure: ESOPHAGOGASTRODUODENOSCOPY (EGD);  Surgeon: Thornton Park, MD;  Location: [facility];  Service: Gastroenterology;  Laterality: Left;   ESOPHAGOGASTRODUODENOSCOPY (EGD) WITH PROPOFOL N/A 01/14/2019   Procedure: ESOPHAGOGASTRODUODENOSCOPY (EGD) WITH PROPOFOL;  Surgeon: Thornton Park, MD;  Location: [facility] [place];  Service: Gastroenterology;  Laterality: N/A;   ESOPHAGOGASTRODUODENOSCOPY (EGD) WITH PROPOFOL N/A 01/19/2019   Procedure: ESOPHAGOGASTRODUODENOSCOPY (EGD) WITH PROPOFOL;  Surgeon: Thornton Park, MD;  Location: [facility];  Service: Gastroenterology;  Laterality: N/A;   IR ANGIOGRAM SELECTIVE EACH ADDITIONAL  VESSEL  01/15/2019   IR ANGIOGRAM SELECTIVE EACH ADDITIONAL VESSEL  01/15/2019   IR ANGIOGRAM VISCERAL SELECTIVE  01/15/2019   IR ANGIOGRAM VISCERAL SELECTIVE  01/15/2019   IR ANGIOGRAM VISCERAL SELECTIVE  01/15/2019   IR EMBO ART  VEN HEMORR LYMPH EXTRAV  INC GUIDE ROADMAPPING  01/15/2019   IR US GUIDE VASC ACCESS RIGHT  01/15/2019   NECK SURGERY  Remote    Ruptured disc, per patient. Got infected.    ORIF WRIST FRACTURE Right 10/18/2014   Procedure: OPEN REDUCTION INTERNAL FIXATION (ORIF) RIGHT WRIST FRACTURE;  Surgeon: Dorna Leitz, MD;  Location: [facility];  Service: Orthopedics;  Laterality: Right;   TONSILLECTOMY  1965   TUBAL LIGATION      Current Outpatient Medications  Medication Sig Dispense Refill   acetaminophen (TYLENOL) 325 MG tablet Take 650 mg by mouth every 6 (six) hours as needed for moderate pain.     ARIPiprazole (ABILIFY) 5 MG tablet Take 5 mg by mouth daily.     cholecalciferol (VITAMIN D) 25 MCG (1000 UT) tablet Take 1,000 Units by mouth daily.     clonazePAM (KLONOPIN) 0.5 MG tablet Take 0.5 mg by mouth daily as needed for anxiety.     donepezil (ARICEPT) 10 MG tablet Take 10 mg by mouth at bedtime.     gabapentin (NEURONTIN) 100 MG capsule Take 200 mg by mouth 2 (two) times daily.  Hypromellose (ARTIFICIAL TEARS) 0.4 % SOLN Apply 2 drops to eye 2 (two) times a day.     lamoTRIgine (LAMICTAL) 200 MG tablet Take 400 mg by mouth daily.   0   magnesium citrate (EQ MAGNESIUM CITRATE) SOLN Take 150 mLs by mouth daily as needed for mild constipation or severe constipation.     Menthol, Topical Analgesic, (BIOFREEZE) 4 % GEL Apply 1 application topically 2 (two) times a day. Left shoulder pain     mirabegron ER (MYRBETRIQ) 25 MG TB24 tablet Take 25 mg by mouth daily.     pantoprazole (PROTONIX) 40 MG tablet Take 1 tablet (40 mg total) by mouth 2 (two) times daily. 120 tablet 0   polyethylene glycol (MIRALAX / GLYCOLAX) 17 g packet Take 17 g by mouth daily. 14 each 0    rosuvastatin (CRESTOR) 40 MG tablet Take 40 mg by mouth daily.     sennosides-docusate sodium (SENOKOT-S) 8.6-50 MG tablet Take 1 tablet by mouth daily.     sertraline (ZOLOFT) 100 MG tablet Take 200 mg by mouth daily.   0   thiamine (VITAMIN B-1) 100 MG tablet Take 250 mg by mouth 2 (two) times a day.     zolpidem (AMBIEN) 5 MG tablet Take 5 mg by mouth at bedtime.     No current facility-administered medications for this visit.     Allergies as of 02/19/2019 - Review Complete 02/05/2019  Allergen Reaction Noted   Lipitor [atorvastatin]  03/31/2013   Prednisone Other (See Comments) 05/12/2014    Family History  Problem Relation Age of Onset   Heart disease Mother    Diabetes Mother    Leukemia Father    Heart disease Brother    Cancer Brother    Parkinson's disease Brother     Social History   Socioeconomic History   Marital status: Divorced    Spouse name: Not on file   Number of children: Not on file   Years of education: Not on file   Highest education level: Not on file  Occupational History   Occupation: Retired  Scientist, product/process development strain: Not on file   Food insecurity    Worry: Not on file    Inability: Not on Lexicographer needs    Medical: Not on file    Non-medical: Not on file  Tobacco Use   Smoking status: Former Smoker    Types: Cigarettes    Quit date: 05/14/2010    Years since quitting: 8.7   Smokeless tobacco: Never Used   Tobacco comment: second hand smoke exposure also  Substance and Sexual Activity   Alcohol use: Not Currently    Comment: Quit 2002, denies she ever abused it.   Drug use: No   Sexual activity: Not Currently  Lifestyle   Physical activity    Days per week: Not on file    Minutes per session: Not on file   Stress: Not on file  Relationships   Social connections    Talks on phone: Not on file    Gets together: Not on file    Attends religious service: Not on file     Active member of club or organization: Not on file    Attends meetings of clubs or organizations: Not on file    Relationship status: Not on file   Intimate partner violence    Fear of current or ex partner: Not on file    Emotionally abused: Not on file  Physically abused: Not on file    Forced sexual activity: Not on file  Other Topics Concern   Not on file  Social History Narrative   07/05/17 Pt's niece, phone number [phone]. Pt lives alone at home, and doesn't use a cane or walker, is still pretty active. Never smoker.    1 daughter- deceased   Education- 68   Retired from [employer]   Caffeine- coffee,1 daily, soda 1 daily    Review of Systems: 12 system ROS is negative except as noted above.   Physical Exam: General:   Alert,  well-nourished, pleasant and cooperative in NAD Head:  Normocephalic and atraumatic. Eyes:  Sclera clear, no icterus.   Conjunctiva pink. Ears:  Normal auditory acuity. Nose:  No deformity, discharge,  or lesions. Mouth:  No deformity or lesions.   Neck:  Supple; no masses or thyromegaly. Lungs:  Clear throughout to auscultation.   No wheezes. Heart:  Regular rate and rhythm; no murmurs. Abdomen:  Soft,nontender, nondistended, normal bowel sounds, no rebound or guarding. No hepatosplenomegaly.   Rectal:  Deferred  Msk:  Symmetrical. No boney deformities LAD: No inguinal or umbilical LAD Extremities:  No clubbing or edema. Neurologic:  Alert and  oriented x4;  grossly nonfocal Skin:  Intact without significant lesions or rashes. Psych:  Alert and cooperative. Normal mood and affect.     Malayla Granberry L. Tarri Glenn, MD, MPH 02/19/2019, 11:11 AM

## 2019-02-19 NOTE — Patient Instructions (Addendum)
Resume Ferrosul Tablets 10 mg at bedtime.   Continue Pantoprazole 40 mg twice a day   You have been scheduled for an endoscopy. Please follow written instructions given to you at your visit today. If you use inhalers (even only as needed), please bring them with you on the day of your procedure.

## 2019-03-02 DIAGNOSIS — D500 Iron deficiency anemia secondary to blood loss (chronic): Secondary | ICD-10-CM | POA: Diagnosis not present

## 2019-03-02 DIAGNOSIS — F5101 Primary insomnia: Secondary | ICD-10-CM | POA: Diagnosis not present

## 2019-03-03 ENCOUNTER — Ambulatory Visit (HOSPITAL_COMMUNITY): Payer: Medicare Other | Attending: Cardiology

## 2019-03-03 ENCOUNTER — Other Ambulatory Visit: Payer: Self-pay

## 2019-03-03 DIAGNOSIS — I350 Nonrheumatic aortic (valve) stenosis: Secondary | ICD-10-CM | POA: Diagnosis not present

## 2019-03-04 ENCOUNTER — Encounter: Payer: Self-pay | Admitting: Gastroenterology

## 2019-03-06 ENCOUNTER — Telehealth: Payer: Self-pay

## 2019-03-06 DIAGNOSIS — I350 Nonrheumatic aortic (valve) stenosis: Secondary | ICD-10-CM

## 2019-03-06 NOTE — Telephone Encounter (Signed)
-----   Message from Daune Perch, NP sent at 03/04/2019  3:41 PM EDT ----- Echocardiogram shows some worsening aortic valve stenosis that is now moderate with a mean gradient of 21 mmHg which is increased from the prior mild stenosis with mean gradient of 18 mmHg.  Plan to repeat echocardiogram in 6 months to monitor valve. -I will route these results to Dr. Katharina Caper for his review.   Daune Perch, NP

## 2019-03-11 ENCOUNTER — Telehealth: Payer: Self-pay

## 2019-03-11 NOTE — Telephone Encounter (Signed)
Covid-19 screening questions   Do you now or have you had a fever in the last 14 days?  Do you have any respiratory symptoms of shortness of breath or cough now or in the last 14 days?  Do you have any family members or close contacts with diagnosed or suspected Covid-19 in the past 14 days?  Have you been tested for Covid-19 and found to be positive?       

## 2019-03-12 ENCOUNTER — Other Ambulatory Visit: Payer: Self-pay

## 2019-03-12 ENCOUNTER — Ambulatory Visit (AMBULATORY_SURGERY_CENTER): Payer: Medicare Other | Admitting: Gastroenterology

## 2019-03-12 ENCOUNTER — Other Ambulatory Visit: Payer: Self-pay | Admitting: Gastroenterology

## 2019-03-12 ENCOUNTER — Encounter: Payer: Self-pay | Admitting: Gastroenterology

## 2019-03-12 VITALS — BP 124/61 | HR 64 | Temp 98.0°F | Resp 11 | Ht 66.0 in | Wt 129.0 lb

## 2019-03-12 DIAGNOSIS — K449 Diaphragmatic hernia without obstruction or gangrene: Secondary | ICD-10-CM | POA: Diagnosis not present

## 2019-03-12 DIAGNOSIS — K3189 Other diseases of stomach and duodenum: Secondary | ICD-10-CM

## 2019-03-12 DIAGNOSIS — K269 Duodenal ulcer, unspecified as acute or chronic, without hemorrhage or perforation: Secondary | ICD-10-CM | POA: Diagnosis not present

## 2019-03-12 DIAGNOSIS — K279 Peptic ulcer, site unspecified, unspecified as acute or chronic, without hemorrhage or perforation: Secondary | ICD-10-CM

## 2019-03-12 DIAGNOSIS — K2980 Duodenitis without bleeding: Secondary | ICD-10-CM | POA: Diagnosis not present

## 2019-03-12 MED ORDER — SODIUM CHLORIDE 0.9 % IV SOLN: 500.0000 mL | INTRAVENOUS | Status: DC

## 2019-03-12 NOTE — Progress Notes (Signed)
Temp JB  V/S CW  I have reviewed the patient's medical history in detail and updated the computerized patient record. 

## 2019-03-12 NOTE — Patient Instructions (Addendum)
You continue to have findings in your stomach and duodenum. The ulcer in your duodenum that has worried me the most is still there, although it appears slightly smaller.  I took additional biopsies today to look for any unusual cells. I am out of the office next week, but, will let you know the results when I return.  Please take 8 weeks of pantoprazole 40 mg twice daily - 30 minutes before breakfast and dinner. Avoid all non-steroidal antiinflammatory medications that can irritate the stomach.  We will plan to do this again in 8-10 weeks to be sure that the duodenal biopsies has healed.     YOU HAD AN ENDOSCOPIC PROCEDURE TODAY AT [facility]:   Refer to the procedure report that was given to you for any specific questions about what was found during the examination.  If the procedure report does not answer your questions, please call your gastroenterologist to clarify.  If you requested that your care partner not be given the details of your procedure findings, then the procedure report has been included in a sealed envelope for you to review at your convenience later.  YOU SHOULD EXPECT: Some feelings of bloating in the abdomen. Passage of more gas than usual.  Walking can help get rid of the air that was put into your GI tract during the procedure and reduce the bloating. If you had a lower endoscopy (such as a colonoscopy or flexible sigmoidoscopy) you may notice spotting of blood in your stool or on the toilet paper. If you underwent a bowel prep for your procedure, you may not have a normal bowel movement for a few days.  Please Note:  You might notice some irritation and congestion in your nose or some drainage.  This is from the oxygen used during your procedure.  There is no need for concern and it should clear up in a day or so.  SYMPTOMS TO REPORT IMMEDIATELY:      Following upper endoscopy (EGD)  Vomiting of blood or coffee ground material  New chest pain or pain  under the shoulder blades  Painful or persistently difficult swallowing  New shortness of breath  Fever of 100F or higher  Black, tarry-looking stools  For urgent or emergent issues, a gastroenterologist can be reached at any hour by calling [phone].   DIET:  We do recommend a small meal at first, but then you may proceed to your regular diet.  Drink plenty of fluids but you should avoid alcoholic beverages for 24 hours.  ACTIVITY:  You should plan to take it easy for the rest of today and you should NOT DRIVE or use heavy machinery until tomorrow (because of the sedation medicines used during the test).    FOLLOW UP: Our staff will call the number listed on your records 48-72 hours following your procedure to check on you and address any questions or concerns that you may have regarding the information given to you following your procedure. If we do not reach you, we will leave a message.  We will attempt to reach you two times.  During this call, we will ask if you have developed any symptoms of COVID 19. If you develop any symptoms (ie: fever, flu-like symptoms, shortness of breath, cough etc.) before then, please call [phone].  If you test positive for Covid 19 in the 2 weeks post procedure, please call and report this information to [place].    If any biopsies were taken you  will be contacted by phone or by letter within the next 1-3 weeks.  Please call us at [phone] if you have not heard about the biopsies in 3 weeks.    SIGNATURES/CONFIDENTIALITY: You and/or your care partner have signed paperwork which will be entered into your electronic medical record.  These signatures attest to the fact that that the information above on your After Visit Summary has been reviewed and is understood.  Full responsibility of the confidentiality of this discharge information lies with you and/or your care-partner.

## 2019-03-12 NOTE — Progress Notes (Signed)
Called to room to assist during endoscopic procedure.  Patient ID and intended procedure confirmed with present staff. Received instructions for my participation in the procedure from the performing physician.  

## 2019-03-12 NOTE — Op Note (Signed)
[place] Patient Name: Angelica Kelley Procedure Date: 03/12/2019 1:58 PM MRN: [id number] Endoscopist: Thornton Park MD, MD Age: 73 Referring MD:  Date of Birth: [birth year] Gender: Female Account #: [id number] Procedure:                Upper GI endoscopy Indications:              Follow-up of chronic duodenal ulcer                           NSAID-induced Bleeding gastric ulcers and large                            duodenal ulcer on EGD 01/12/19 and 01/14/19 when                            hospitalized with GI blood loss anemia. Gastric                            biopsies were negative for H pylori. Heaped margins                            on the duodenal ulcer had me concerned for possible                            malignancy. Biopsies were consistent with ulcer.                            Repeat EGD recommended to follow-up on the duodenal                            ulcer. Medicines:                Monitored Anesthesia Care Procedure:                Pre-Anesthesia Assessment:                           - Prior to the procedure, a History and Physical                            was performed, and patient medications and                            allergies were reviewed. The patient's tolerance of                            previous anesthesia was also reviewed. The risks                            and benefits of the procedure and the sedation                            options and risks were discussed with the patient.  All questions were answered, and informed consent                            was obtained. Prior Anticoagulants: The patient has                            taken no previous anticoagulant or antiplatelet                            agents. ASA Grade Assessment: III - A patient with                            severe systemic disease. After reviewing the risks                            and benefits, the patient was deemed in                      satisfactory condition to undergo the procedure.                           After obtaining informed consent, the endoscope was                            passed under direct vision. Throughout the                            procedure, the patient's blood pressure, pulse, and                            oxygen saturations were monitored continuously. The                            Endoscope was introduced through the mouth, and                            advanced to the third part of duodenum. The upper                            GI endoscopy was accomplished without difficulty.                            The patient tolerated the procedure well. Scope In: Scope Out: Findings:                 The esophagus was normal.                           A small hiatal hernia was present.                           Multiple, large non-bleeding erosions were found in                            the gastric antrum. There were no stigmata of  recent bleeding. Biopsies were taken with a cold                            forceps for histology. Estimated blood loss was                            minimal.                           One non-bleeding cratered duodenal ulcer with no                            stigmata of bleeding was found in the duodenal                            bulb. The ulcer is still present but the ulcer                            margins are not as heaped as they were in                            September. The lesion was 8 mm in largest                            dimension. Biopsies were taken with a cold forceps                            for histology. Estimated blood loss: none. The                            ulcer is creating some deformity of the duodenal                            sweep but I was able to advance the gastroscope                            into the second portion of the duodenum with                            minimal  resistance.                           The cardia and gastric fundus were normal on                            retroflexion. Complications:            No immediate complications. Estimated blood loss:                            Minimal. Estimated Blood Loss:     Estimated blood loss was minimal. Impression:               - Normal esophagus.                           -  Small hiatal hernia.                           - Non-bleeding erosive gastropathy. Biopsied.                           - Non-bleeding duodenal ulcer with no stigmata of                            bleeding. Biopsied. Recommendation:           - Patient has a contact number available for                            emergencies. The signs and symptoms of potential                            delayed complications were discussed with the                            patient. Return to normal activities tomorrow.                            Written discharge instructions were provided to the                            patient.                           - Resume previous diet today.                           - No aspirin, ibuprofen, naproxen, or other                            non-steroidal anti-inflammatory drugs.                           - Continue present medications.                           - Take an additional 8 weeks of pantoprazole 40 mg                            BID.                           - Await pathology results.                           - Repeat upper endoscopy in 8 weeks to check                            healing. Thornton Park MD, MD 03/12/2019 2:18:51 PM This report has been signed electronically.

## 2019-03-12 NOTE — Progress Notes (Signed)
PT taken to PACU. Monitors in place. VSS. Report given to RN. 

## 2019-03-12 NOTE — Progress Notes (Signed)
Repeat EGD to check for ulcer healing.  05-06-19 10:00 am.  Previsit 04-29-19 10:00.  Dr. Tarri Glenn has spoken with pt's brother re findings.  Maw   No problems noted in the recovery room. maw

## 2019-03-16 ENCOUNTER — Telehealth: Payer: Self-pay | Admitting: *Deleted

## 2019-03-16 ENCOUNTER — Telehealth: Payer: Self-pay

## 2019-03-16 NOTE — Telephone Encounter (Signed)
  Follow up Call-  Call back number 03/12/2019  Post procedure Call Back phone  # [phone]  Permission to leave phone message Yes  Some recent data might be hidden     Patient questions:  Do you have a fever, pain , or abdominal swelling? No. Pain Score  0 *  Have you tolerated food without any problems? Yes.    Have you been able to return to your normal activities? Yes.    Do you have any questions about your discharge instructions: Diet   No. Medications  No. Follow up visit  No.  Do you have questions or concerns about your Care? No.  Actions: * If pain score is 4 or above: No action needed, pain <4.  1. Have you developed a fever since your procedure? no  2.   Have you had an respiratory symptoms (SOB or cough) since your procedure? no  3.   Have you tested positive for COVID 19 since your procedure no  4.   Have you had any family members/close contacts diagnosed with the COVID 19 since your procedure?  no   If yes to any of these questions please route to Joylene John, RN and Alphonsa Gin, Therapist, sports.

## 2019-03-16 NOTE — Telephone Encounter (Signed)
Left message on follow up call. 

## 2019-03-17 ENCOUNTER — Other Ambulatory Visit: Payer: Self-pay

## 2019-03-17 ENCOUNTER — Encounter: Payer: Self-pay | Admitting: Gastroenterology

## 2019-03-17 ENCOUNTER — Telehealth: Payer: Self-pay

## 2019-03-17 DIAGNOSIS — Z1159 Encounter for screening for other viral diseases: Secondary | ICD-10-CM

## 2019-03-17 NOTE — Telephone Encounter (Signed)
Called patient to schedule a COVID test for the Monday (05/04/2019) before her procedure on Wednesday. Patient says she needs to talk to her daughter first because that is who takes her. I gave her the number to recovery and she will call me back.

## 2019-03-23 ENCOUNTER — Telehealth: Payer: Self-pay | Admitting: Gastroenterology

## 2019-03-23 NOTE — Telephone Encounter (Signed)
Spoke to the patient's niece, Ms. Jilda Panda who requested confirmation times and dates of the patient's previsit, covid screen and EGD. Ms. Tina Griffiths stated that she may not be able to bring the patient for her covid screen and may need to post pone the procedure.

## 2019-03-30 DIAGNOSIS — Z79899 Other long term (current) drug therapy: Secondary | ICD-10-CM | POA: Diagnosis not present

## 2019-03-30 DIAGNOSIS — K5909 Other constipation: Secondary | ICD-10-CM | POA: Diagnosis not present

## 2019-03-30 DIAGNOSIS — D509 Iron deficiency anemia, unspecified: Secondary | ICD-10-CM | POA: Diagnosis not present

## 2019-03-30 DIAGNOSIS — I10 Essential (primary) hypertension: Secondary | ICD-10-CM | POA: Diagnosis not present

## 2019-04-06 DIAGNOSIS — K257 Chronic gastric ulcer without hemorrhage or perforation: Secondary | ICD-10-CM | POA: Diagnosis not present

## 2019-04-06 DIAGNOSIS — Z79899 Other long term (current) drug therapy: Secondary | ICD-10-CM | POA: Diagnosis not present

## 2019-04-06 DIAGNOSIS — D500 Iron deficiency anemia secondary to blood loss (chronic): Secondary | ICD-10-CM | POA: Diagnosis not present

## 2019-04-21 ENCOUNTER — Encounter: Payer: Self-pay | Admitting: Gastroenterology

## 2019-04-21 ENCOUNTER — Other Ambulatory Visit: Payer: Self-pay

## 2019-04-21 ENCOUNTER — Ambulatory Visit (AMBULATORY_SURGERY_CENTER): Payer: Self-pay

## 2019-04-21 VITALS — Temp 96.8°F | Ht 66.0 in | Wt 135.4 lb

## 2019-04-21 DIAGNOSIS — K269 Duodenal ulcer, unspecified as acute or chronic, without hemorrhage or perforation: Secondary | ICD-10-CM

## 2019-04-21 NOTE — Progress Notes (Signed)
Denies allergies to eggs or soy products. Denies complication of anesthesia or sedation. Denies use of weight loss medication. Denies use of O2.   Emmi instructions given for colonoscopy.  Covid screening is scheduled for 05/04/19 @ 10:00 Am.

## 2019-04-27 DIAGNOSIS — R350 Frequency of micturition: Secondary | ICD-10-CM | POA: Diagnosis not present

## 2019-04-27 DIAGNOSIS — I10 Essential (primary) hypertension: Secondary | ICD-10-CM | POA: Diagnosis not present

## 2019-04-29 DIAGNOSIS — B351 Tinea unguium: Secondary | ICD-10-CM | POA: Diagnosis not present

## 2019-04-29 DIAGNOSIS — N182 Chronic kidney disease, stage 2 (mild): Secondary | ICD-10-CM | POA: Diagnosis not present

## 2019-04-29 DIAGNOSIS — M79673 Pain in unspecified foot: Secondary | ICD-10-CM | POA: Diagnosis not present

## 2019-04-29 DIAGNOSIS — L851 Acquired keratosis [keratoderma] palmaris et plantaris: Secondary | ICD-10-CM | POA: Diagnosis not present

## 2019-04-29 DIAGNOSIS — L608 Other nail disorders: Secondary | ICD-10-CM | POA: Diagnosis not present

## 2019-05-04 ENCOUNTER — Ambulatory Visit (INDEPENDENT_AMBULATORY_CARE_PROVIDER_SITE_OTHER): Payer: Medicare Other

## 2019-05-04 ENCOUNTER — Other Ambulatory Visit: Payer: Self-pay | Admitting: Gastroenterology

## 2019-05-04 DIAGNOSIS — Z1159 Encounter for screening for other viral diseases: Secondary | ICD-10-CM

## 2019-05-04 DIAGNOSIS — N3000 Acute cystitis without hematuria: Secondary | ICD-10-CM | POA: Diagnosis not present

## 2019-05-05 LAB — SARS CORONAVIRUS 2 (TAT 6-24 HRS): SARS Coronavirus 2: NEGATIVE

## 2019-05-06 ENCOUNTER — Other Ambulatory Visit: Payer: Self-pay

## 2019-05-06 ENCOUNTER — Encounter: Payer: Self-pay | Admitting: Internal Medicine

## 2019-05-06 ENCOUNTER — Encounter: Payer: Medicare Other | Admitting: Gastroenterology

## 2019-05-06 ENCOUNTER — Ambulatory Visit (AMBULATORY_SURGERY_CENTER): Payer: Medicare Other | Admitting: Internal Medicine

## 2019-05-06 VITALS — BP 109/50 | HR 63 | Temp 98.4°F | Resp 10 | Ht 66.0 in | Wt 135.0 lb

## 2019-05-06 DIAGNOSIS — K259 Gastric ulcer, unspecified as acute or chronic, without hemorrhage or perforation: Secondary | ICD-10-CM | POA: Diagnosis not present

## 2019-05-06 DIAGNOSIS — K449 Diaphragmatic hernia without obstruction or gangrene: Secondary | ICD-10-CM | POA: Diagnosis not present

## 2019-05-06 DIAGNOSIS — K3189 Other diseases of stomach and duodenum: Secondary | ICD-10-CM

## 2019-05-06 DIAGNOSIS — K269 Duodenal ulcer, unspecified as acute or chronic, without hemorrhage or perforation: Secondary | ICD-10-CM

## 2019-05-06 MED ORDER — SODIUM CHLORIDE 0.9 % IV SOLN: 500.0000 mL | INTRAVENOUS | Status: DC

## 2019-05-06 NOTE — Op Note (Signed)
[facility] Patient Name: Angelica Kelley Procedure Date: 05/06/2019 11:16 AM MRN: [id number] Endoscopist: Jerene Bears , MD Age: 73 Referring MD:  Date of Birth: [birth year] Gender: Female Account #: [id number] Procedure:                Upper GI endoscopy Indications:              Follow-up of chronic duodenal ulcer, last EGD with                            Dr. Tarri Glenn Oct 2020 Medicines:                Monitored Anesthesia Care Procedure:                Pre-Anesthesia Assessment:                           - Prior to the procedure, a History and Physical                            was performed, and patient medications and                            allergies were reviewed. The patient's tolerance of                            previous anesthesia was also reviewed. The risks                            and benefits of the procedure and the sedation                            options and risks were discussed with the patient.                            All questions were answered, and informed consent                            was obtained. Prior Anticoagulants: The patient has                            taken no previous anticoagulant or antiplatelet                            agents. ASA Grade Assessment: III - A patient with                            severe systemic disease. After reviewing the risks                            and benefits, the patient was deemed in                            satisfactory condition to undergo the procedure.  After obtaining informed consent, the endoscope was                            passed under direct vision. Throughout the                            procedure, the patient's blood pressure, pulse, and                            oxygen saturations were monitored continuously. The                            Endoscope was introduced through the mouth, and                            advanced to the second part of  duodenum. The upper                            GI endoscopy was accomplished without difficulty.                            The patient tolerated the procedure well. Scope In: Scope Out: Findings:                 Normal mucosa was found in the entire esophagus.                           A small hiatal hernia was present.                           Mildly erythematous mucosa without bleeding was                            found in the gastric antrum. This has been                            previously biopsied showing benign reactive                            gastropathy without H. Pylori.                           A healed ulcer with scarring was found in the                            duodenal bulb. This healed ulcer has caused a                            deformity in the duodenal bulb this causes mild                            luminal narrowing but is non-obstructing (the adult  endoscope passes easily). 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                           The second portion of the duodenum was normal. Complications:            No immediate complications. Estimated Blood Loss:     Estimated blood loss: none. Impression:               - Normal mucosa was found in the entire esophagus.                           - Small hiatal hernia.                           - Erythematous mucosa in the antrum.                           - Duodenal scar with duodenal bulb deformity from                            prior ulcer disease. Previous duodenal ulcer has                            healed.                           - Normal second portion of the duodenum.                           - No specimens collected. Recommendation:           - Patient has a contact number available for                            emergencies. The signs and symptoms of potential                            delayed complications were discussed with the                             patient. Return to normal activities tomorrow.                            Written discharge instructions were provided to the                            patient.                           - Resume previous diet.                           - Continue present medications.                           -  Return to GI clinic in 2-3 months for follow-up                            with Dr. Tarri Glenn. Jerene Bears, MD 05/06/2019 11:37:51 AM This report has been signed electronically.

## 2019-05-06 NOTE — Patient Instructions (Signed)
Thank-you for choosing [place] for your healthcare needs today.  YOU HAD AN ENDOSCOPIC PROCEDURE TODAY AT [facility]:   Refer to the procedure report that was given to you for any specific questions about what was found during the examination.  If the procedure report does not answer your questions, please call your gastroenterologist to clarify.  If you requested that your care partner not be given the details of your procedure findings, then the procedure report has been included in a sealed envelope for you to review at your convenience later.  YOU SHOULD EXPECT: Some feelings of bloating in the abdomen. Passage of more gas than usual.  Walking can help get rid of the air that was put into your GI tract during the procedure and reduce the bloating.   Please Note:  You might notice some irritation and congestion in your nose or some drainage.  This is from the oxygen used during your procedure.  There is no need for concern and it should clear up in a day or so.  SYMPTOMS TO REPORT IMMEDIATELY:    Following upper endoscopy (EGD)  Vomiting of blood or coffee ground material  New chest pain or pain under the shoulder blades  Painful or persistently difficult swallowing  New shortness of breath  Fever of 100F or higher  Black, tarry-looking stools  For urgent or emergent issues, a gastroenterologist can be reached at any hour by calling [phone].   DIET:  We do recommend a small meal at first, but then you may proceed to your regular diet.  Drink plenty of fluids but you should avoid alcoholic beverages for 24 hours.  ACTIVITY:  You should plan to take it easy for the rest of today and you should NOT DRIVE or use heavy machinery until tomorrow (because of the sedation medicines used during the test).    FOLLOW UP: Our staff will call the number listed on your records 48-72 hours following your procedure to check on you and address any questions or concerns that you may  have regarding the information given to you following your procedure. If we do not reach you, we will leave a message.  We will attempt to reach you two times.  During this call, we will ask if you have developed any symptoms of COVID 19. If you develop any symptoms (ie: fever, flu-like symptoms, shortness of breath, cough etc.) before then, please call [phone].  If you test positive for Covid 19 in the 2 weeks post procedure, please call and report this information to [place].     SIGNATURES/CONFIDENTIALITY: You and/or your care partner have signed paperwork which will be entered into your electronic medical record.  These signatures attest to the fact that that the information above on your After Visit Summary has been reviewed and is understood.  Full responsibility of the confidentiality of this discharge information lies with you and/or your care-partner.

## 2019-05-06 NOTE — Progress Notes (Signed)
Temp LC V/S CW 

## 2019-05-06 NOTE — Progress Notes (Signed)
PT taken to PACU. Monitors in place. VSS. Report given to RN. 

## 2019-05-11 ENCOUNTER — Telehealth: Payer: Self-pay | Admitting: *Deleted

## 2019-05-11 ENCOUNTER — Telehealth: Payer: Self-pay

## 2019-05-11 NOTE — Telephone Encounter (Signed)
Attempted to reach patient for post-procedure f/u call. No answer. Left message for her to please not hesitate to call us if she has any questions/concerns regarding her care. 

## 2019-05-11 NOTE — Telephone Encounter (Signed)
Attempted follow-up phone call. No answer, left message.

## 2019-06-05 IMAGING — DX DG CHEST 1V PORT
1 series · 1 of 1 positions shown · non-contrast
Comparison: Chest radiograph July 01, 2017

CLINICAL DATA: Found down today.

EXAM:
PORTABLE CHEST 1 VIEW

[chest ap]
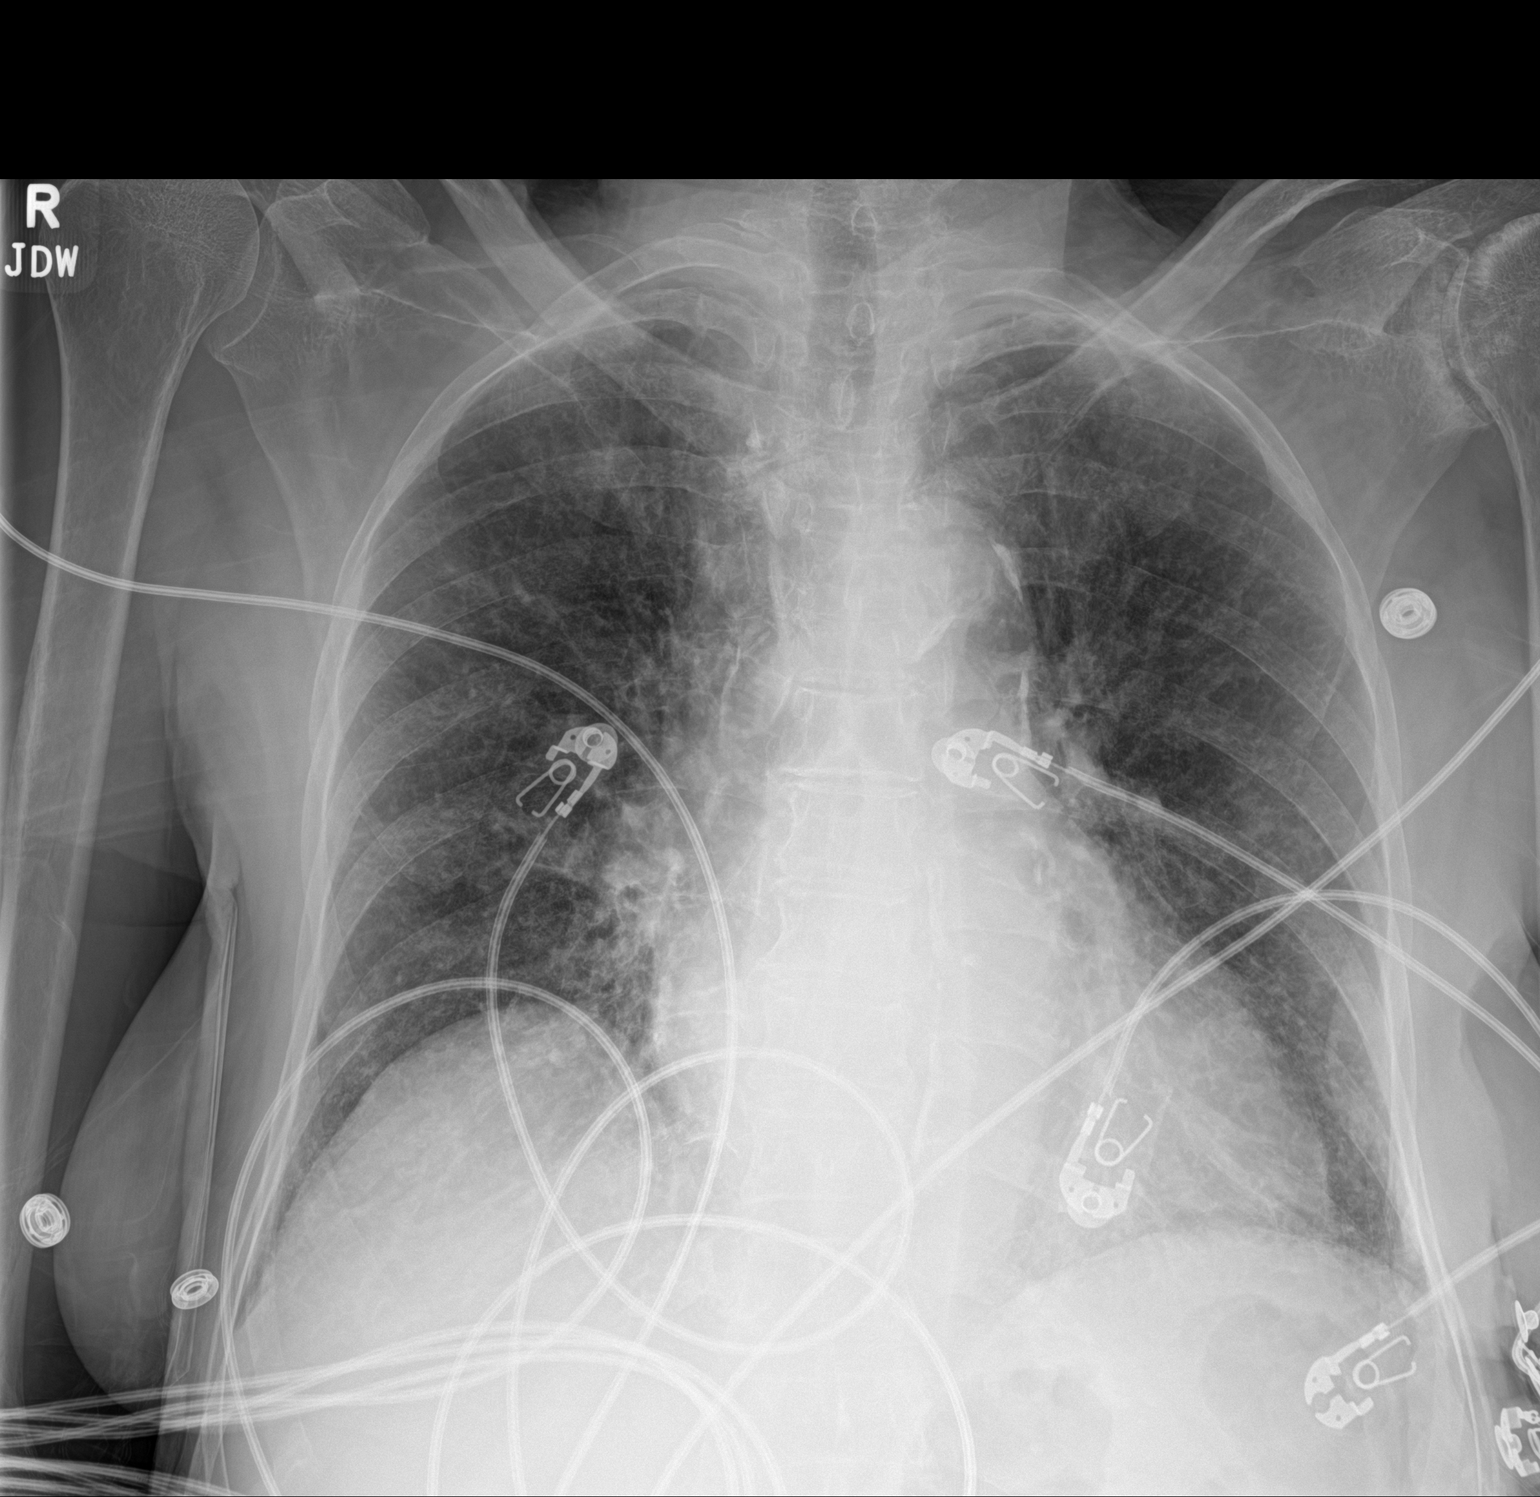

[1 of 1 positions shown; findings below may reference images not displayed]

FINDINGS: Cardiac silhouette is mildly enlarged and unchanged. Calcified
aortic knob. Chronic interstitial changes without pleural effusion
or focal consolidation. No pneumothorax. Stable apical pleural
thickening. Osteopenia. Severe LEFT shoulder osteoarthrosis,
incompletely imaged.
IMPRESSION: Stable cardiomegaly and chronic interstitial changes.

Aortic Atherosclerosis (GIASX-JPE.[email or web address]).

## 2019-06-05 IMAGING — CT CT HEAD W/O CM
4 of 7 series · 16 of 47 positions shown, 17 images · non-contrast
Comparison: 07/02/2017 MR. 07/01/2016 CT head and cervical spine

CLINICAL DATA: 72-year-old female found on floor by family. Last
seen 3 days ago. Initial encounter.

EXAM:
CT HEAD WITHOUT CONTRAST
CT CERVICAL SPINE WITHOUT CONTRAST
TECHNIQUE: Multidetector CT imaging of the head and cervical spine was
performed following the standard protocol without intravenous
contrast. Multiplanar CT image reconstructions of the cervical spine
were also generated.

[Series 3: head 5.0 h30s · axial · 0.45mm/px · z∈[-183,-78]mm · 4 of 35 slices shown, 5 images]
[im 7/35  brain]
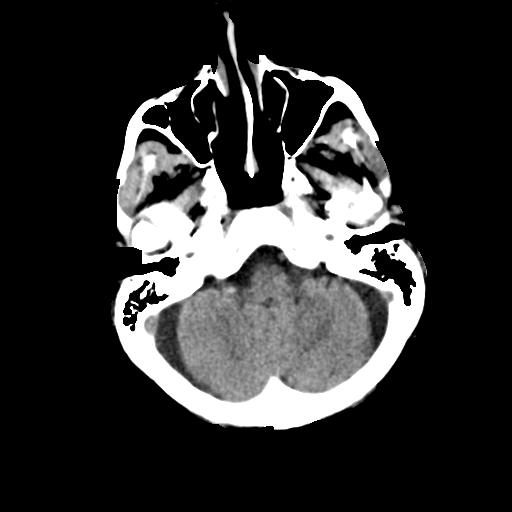
[im 7/35  bone]
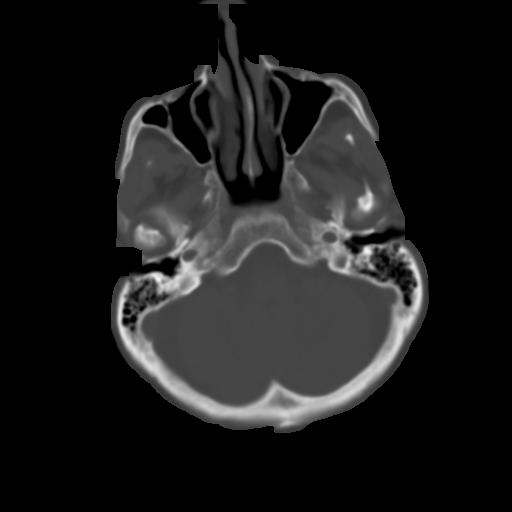
[im 14/35  brain]
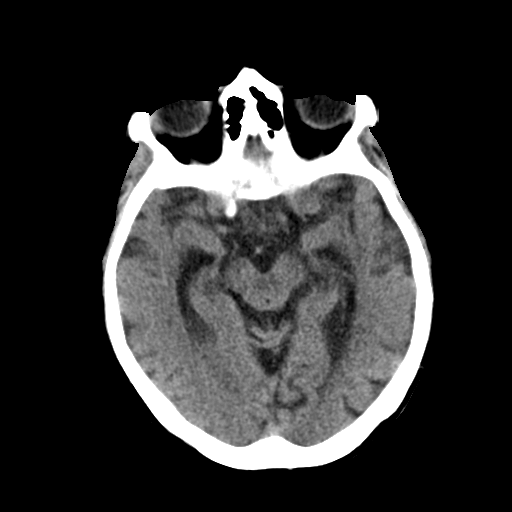
[im 21/35  brain]
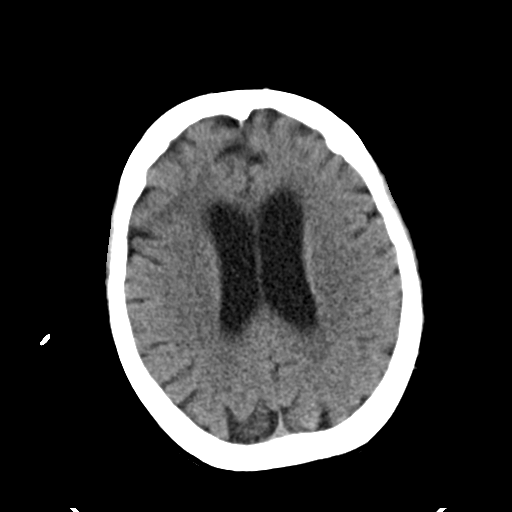
[im 28/35  brain]
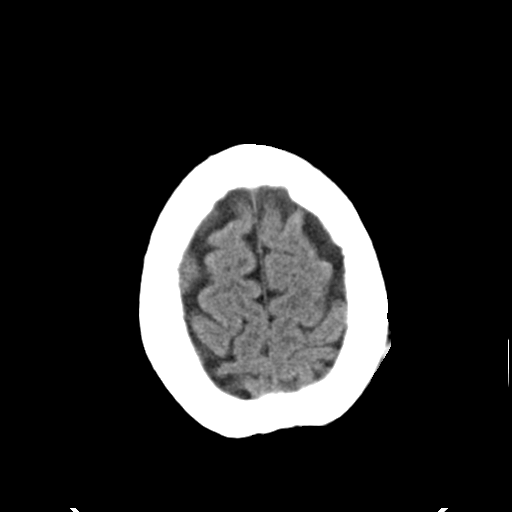

[Series 5: head 3.0 mpr cor · coronal · 0.34mm/px · 3 of 67 slices shown]
[im 17/67  brain]
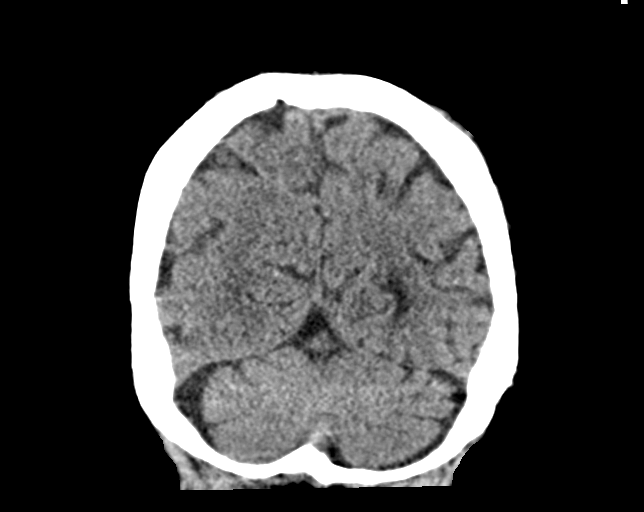
[im 34/67  brain]
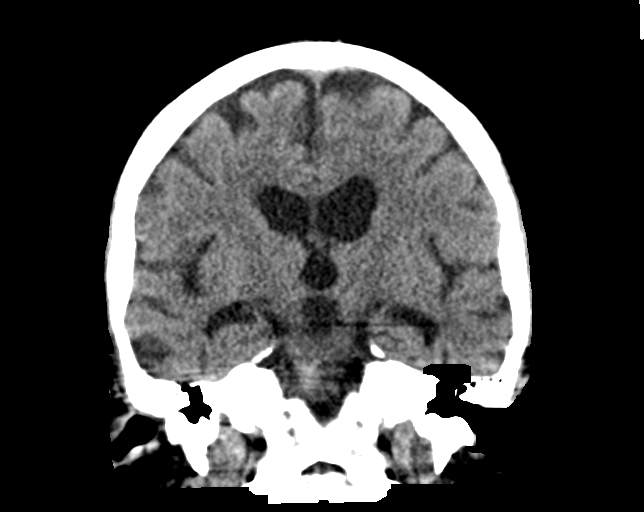
[im 50/67  brain]
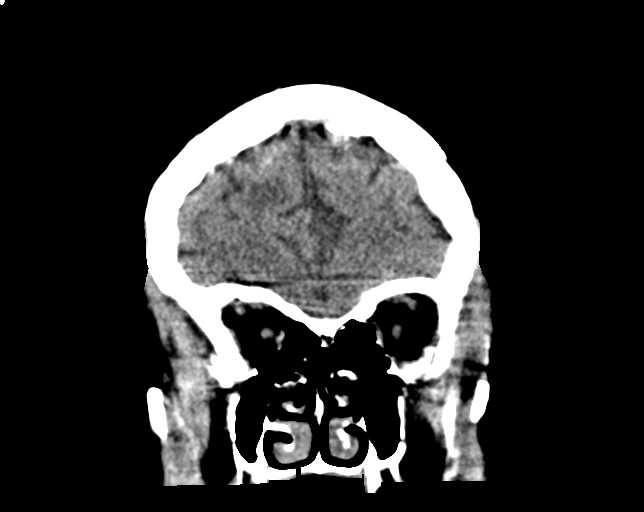

[Series 6: head 3.0 mpr sag · sagittal · 0.34mm/px · 2 of 64 slices shown]
[im 22/64  brain]
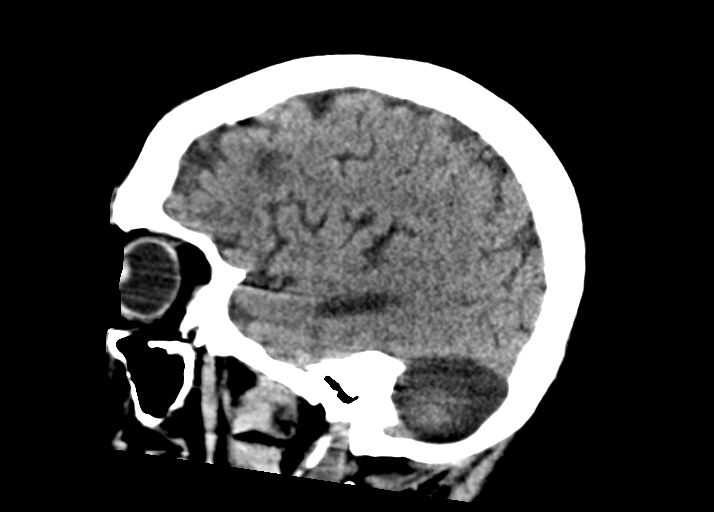
[im 43/64  brain]
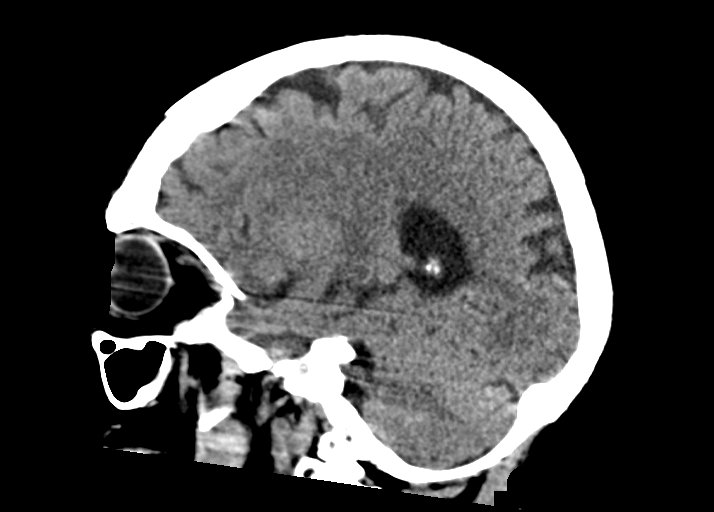

[Series 13: orthogonal axial st · axial · 0.21mm/px · z∈[-338,-250]mm · 7 of 81 slices shown]
[im 8/81  brain]
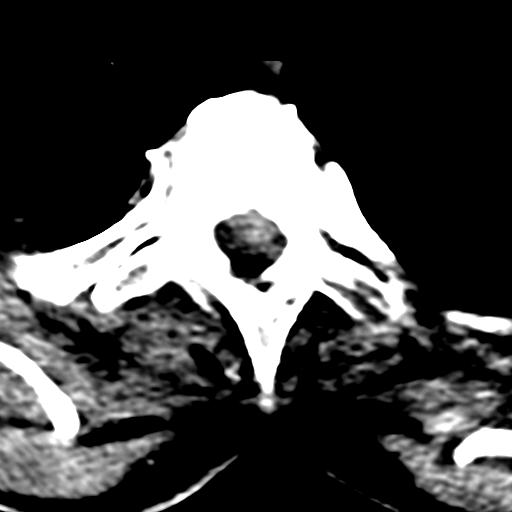
[im 15/81  brain]
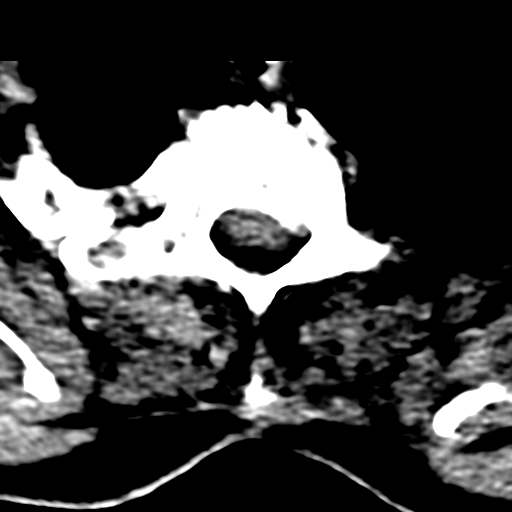
[im 30/81  brain]
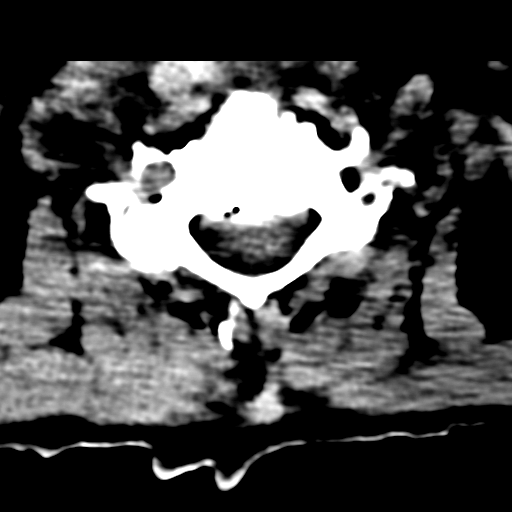
[im 37/81  brain]
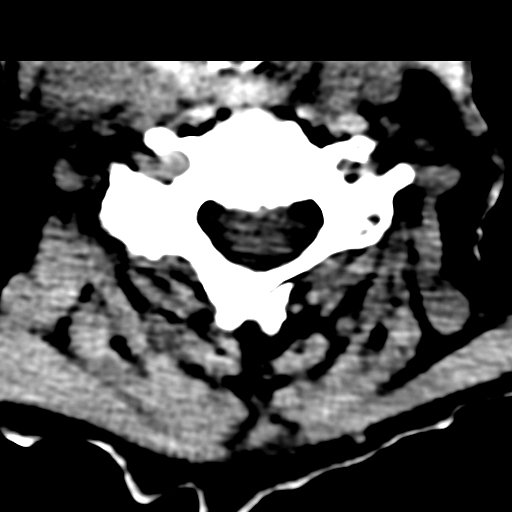
[im 44/81  brain]
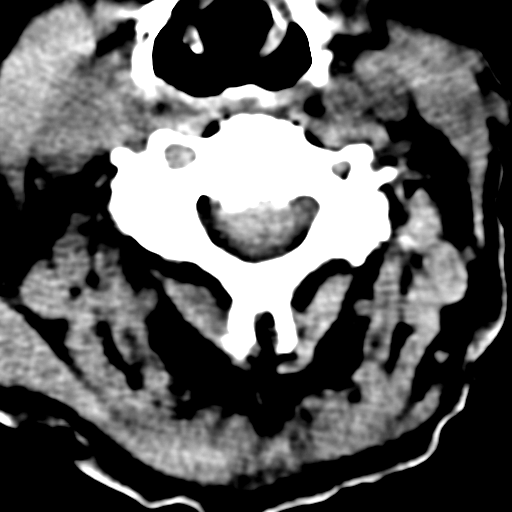
[im 51/81  brain]
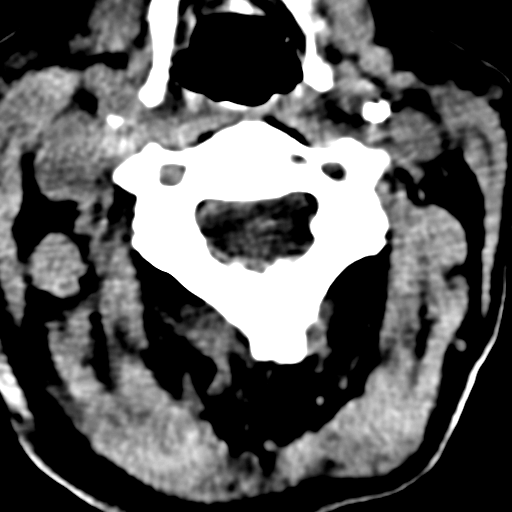
[im 66/81  brain]
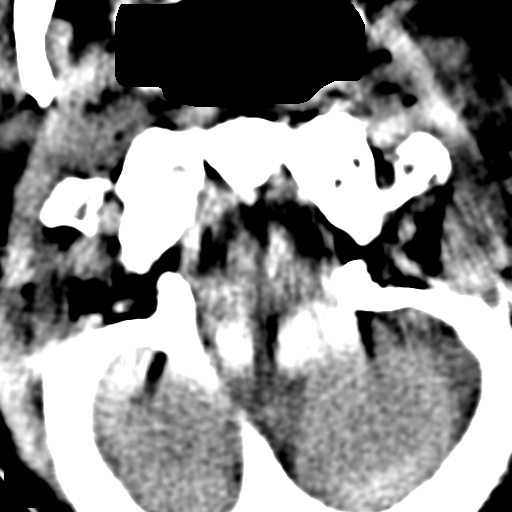

[16 of 47 positions shown; findings below may reference images not displayed]

FINDINGS: CT HEAD FINDINGS

Brain: Exam is motion degraded.

Resolution of previously noted left-sided chronic subdural hematoma.
No new intracranial hemorrhage noted.

Chronic microvascular changes without CT evidence of large acute
infarct.

Global atrophy.

No intracranial mass lesion noted on this unenhanced exam.

Vascular: Vascular calcifications

Skull: No skull fracture

Sinuses/Orbits: No acute orbital abnormality. Visualized paranasal
sinuses are clear. Mastoid air cells and middle ear cavities are
clear. Temporomandibular joint degenerative changes.

Other: Left parietal scalp hematoma.

CT CERVICAL SPINE FINDINGS

Alignment: Curvature cervical spine convex left.

Skull base and vertebrae: No cervical spine fracture noted.

Soft tissues and spinal canal: No abnormal prevertebral soft tissue
swelling.

Disc levels: Multilevel cervical spondylotic changes most prominent
C5-6 level.

Upper chest: Scarring lung apices.

Other: Bilateral carotid bifurcation calcifications.
IMPRESSION: Exams are motion degraded.

Interval clearing of previously noted left-sided chronic subdural
hematoma. No new intracranial hemorrhage noted.

Chronic microvascular changes without CT evidence of large acute
infarct. Atrophy.

No cervical spine fracture detected. Curvature cervical spine convex
left. No abnormal prevertebral soft tissue swelling.

Cervical spondylotic changes similar to prior exam.

## 2019-07-07 ENCOUNTER — Ambulatory Visit: Payer: Medicare Other | Admitting: Gastroenterology

## 2019-07-07 ENCOUNTER — Encounter: Payer: Self-pay | Admitting: Gastroenterology

## 2019-07-07 ENCOUNTER — Other Ambulatory Visit (INDEPENDENT_AMBULATORY_CARE_PROVIDER_SITE_OTHER): Payer: Medicare Other

## 2019-07-07 ENCOUNTER — Other Ambulatory Visit: Payer: Self-pay

## 2019-07-07 VITALS — BP 120/68 | HR 56 | Temp 98.3°F | Ht 66.0 in | Wt 140.3 lb

## 2019-07-07 DIAGNOSIS — D509 Iron deficiency anemia, unspecified: Secondary | ICD-10-CM

## 2019-07-07 DIAGNOSIS — R194 Change in bowel habit: Secondary | ICD-10-CM | POA: Diagnosis not present

## 2019-07-07 DIAGNOSIS — K269 Duodenal ulcer, unspecified as acute or chronic, without hemorrhage or perforation: Secondary | ICD-10-CM

## 2019-07-07 LAB — IRON: Iron: 20 ug/dL — ABNORMAL LOW (ref 42–145)

## 2019-07-07 LAB — CBC WITH DIFFERENTIAL/PLATELET
Basophils Absolute: 0.1 10*3/uL (ref 0.0–0.1)
Basophils Relative: 0.8 % (ref 0.0–3.0)
Eosinophils Absolute: 0.1 10*3/uL (ref 0.0–0.7)
Eosinophils Relative: 1.8 % (ref 0.0–5.0)
HCT: 34.4 % — ABNORMAL LOW (ref 36.0–46.0)
Hemoglobin: 11 g/dL — ABNORMAL LOW (ref 12.0–15.0)
Lymphocytes Relative: 17.9 % (ref 12.0–46.0)
Lymphs Abs: 1.4 10*3/uL (ref 0.7–4.0)
MCHC: 32.1 g/dL (ref 30.0–36.0)
MCV: 71.7 fl — ABNORMAL LOW (ref 78.0–100.0)
Monocytes Absolute: 0.5 10*3/uL (ref 0.1–1.0)
Monocytes Relative: 6.2 % (ref 3.0–12.0)
Neutro Abs: 5.9 10*3/uL (ref 1.4–7.7)
Neutrophils Relative %: 73.3 % (ref 43.0–77.0)
Platelets: 170 10*3/uL (ref 150.0–400.0)
RBC: 4.79 Mil/uL (ref 3.87–5.11)
RDW: 18.6 % — ABNORMAL HIGH (ref 11.5–15.5)
WBC: 8 10*3/uL (ref 4.0–10.5)

## 2019-07-07 LAB — TSH: TSH: 1.16 u[IU]/mL (ref 0.35–4.50)

## 2019-07-07 LAB — FERRITIN: Ferritin: 8.2 ng/mL — ABNORMAL LOW (ref 10.0–291.0)

## 2019-07-07 MED ORDER — NA SULFATE-K SULFATE-MG SULF 17.5-3.13-1.6 GM/177ML PO SOLN: 1.0000 | ORAL | 0 refills | Status: DC

## 2019-07-07 NOTE — Addendum Note (Signed)
Addended by: Donell Beers on: 07/07/2019 04:51 PM   Modules accepted: Level of Service

## 2019-07-07 NOTE — Patient Instructions (Signed)
Resume Ferrousol Tablets 10 mg at bedtime.   Continue Pantoprazole to 40 mg twice a day   Add Metamucil 1 tablespoon twice a day. Avoid all NSAID medications.   Your provider has requested that you go to the basement level for lab work before leaving today. Press "B" on the elevator. The lab is located at the first door on the left as you exit the elevator.  You have been scheduled for a colonoscopy. Please follow written instructions given to you at your visit today.  Please pick up your prep supplies at the pharmacy within the next 1-3 days. If you use inhalers (even only as needed), please bring them with you on the day of your procedure.

## 2019-07-07 NOTE — Progress Notes (Addendum)
Referring Provider: Housecalls, Doctors Mak* Primary Care Physician:  Housecalls, Doctors Making  Chief complaint:  Ulcers, constipation   IMPRESSION:  NSAID-induced PUD with associated GI blood loss anemia    - Bleeding gastric ulcers and large duodenal ulcer on EGD 12/2018    - Gastric biopsies were negative for H pylori.     - gastric level 196 on PPI therapy    - discontinued Mobic 01/2019 Iron deficiency anemia with recent acute GI bleeding related to PUD Acute on chronic constipation despite Miralax and Senna  PUD: EGD shows resolution of NSAID-related ulcers. No biopsies showed intestinal metaplasia or malignancy.  Angelica Kelley is no longer using NSAIDs. Continues on PPI BID. Gastrin level was 196 while on PPI use. Will plan to follow-up with a fastin gastrin level after Angelica Kelley is off PPI therapy. Will attempt to taper Angelica Kelley PPI over the summer if Angelica Kelley symptoms remain well controlled  Iron deficiency: Severe on last available labs. Repeat hemoglobin and iron studies today. If remains low, will proceed with IV iron in addition to resume oral iron today. Colonoscopy planned. Will consider capsule endoscopy if anemia persists despite treatment of PUD.   Acute on chronic constipation: Check TSH. Continue Miralax. Add Metamucil BID. Colonoscopy recommended.    PLAN: Resume Ferrousul Tablets 10 mg QHS Continue pantoprazole 40 mg BID Add Metamucil 1 tablespoon BID Avoid all NSAIDs CBC, ferritin, iron TSH Colonoscopy with 2 day bowel prep given Angelica Kelley baseline constipation  Please see the "Patient Instructions" section for addition details about the plan and the plan outlined on documentations for Angelica Kelley nursing home.  ADDENDUM: Labs today show severe iron deficiency anemia with iron 20, ferritin 8.2, hgb 11, MCV 71.7, RDW 18.6, platelets 170. We worked today to arrange for IV Feraheme 510 mg over 15 minutes at the infusion center; repeated 7 days later.  Will repeat labs one month later with iron and  ferritin.   I spent 42 minutes of time, including in depth chart review, independent review of results as outlined above, communicating results with the patient directly, face-to-face time with the patient, coordinating care, ordering studies and medications as appropriate, and documentation.  HPI: Angelica Kelley is a 74 y.o. female returns in scheduled follow-up after Angelica Kelley last EGD 05/06/19. Angelica Kelley niece accompanies Angelica Kelley to this appointment but was in the waiting room during my evaluation. Angelica Kelley has dementia,TIA, hypertension, hyperlipidemia, diastolic congestive heart failure, atrial fibrillation, alcoholism and iron deficiency anemia. Angelica Kelley was admitted in August 2020 following a fall and reported hematemesis and melena stools. On presentation to the hospital, hemoglobin was documented as 3.5 g/dL. Hemoglobin was 10 g/dL on discharge.   EGD 01/12/19:  At least 15 non-bleeding cratered gastric ulcers with no stigmata of bleeding were found in the gastric body and in the gastric antrum. The largest lesion was 6 mm in largest dimension. There is a mass like area in the duoenal bulb adjacent to one non-bleeding cratered duodenal ulcer with adherent clot was found in the duodenal bulb. Gastric biopsies were negative for H pylori.  EGD 01/14/19:  At least 15 non-bleeding cratered gastric ulcers with no stigmata of bleeding were found in the gastric body and in the gastric antrum. The largest was 69mm.  One large non-obstructing non-bleeding cratered duodenal ulcer with pigmented material consumed over half the circumference of the duodenal bulb. The margin at the duodenal sweep appears slightly heaped and was friable with the passage of the gastroscope. Small hiatal hernia.  EGD 01/19/19: Many (>  10) non-bleeding cratered gastric ulcers with no stigmata of bleeding were found in the gastric body and in the gastric antrum. The largest lesion was 4 mm in largest dimension. Scattered erosions were also present. No  blood present. One non-obstructing non-bleeding cratered duodenal ulcer with pigmented material was found consuming almost the entire duodenal bulb. The margins of the ulcer appeared heaped and were biopsied with cold forceps. Biopsies were consistent with ulcer. No malignancy seen. EGD 03/12/19:  Small hiatal hernia, multiple, large nonbleeding erosions in the gastric antrum, 1 nonbleeding cratered duodenal ulcer in the duodenal bulb measuring 8 mm. EGD 05/06/19: Small hiatal hernia, gastritis, duodenal scar with duodenal bulb deformity from prior ulcer disease.  Angelica Kelley had significant abdominal pain before Angelica Kelley hospitalization in August 2020.  Pain initially persisted after Angelica Kelley hospitalization, but had improved during Angelica Kelley office visit in October.   The pain has completely resolved since that time.  Angelica Kelley denies any GI symptoms today except for constipation. Continues on Protonix 40 mg twice daily.   Lifetime history of constipation with associated straining that has recently worsened.  Angelica Kelley is having a bowel movement every other day. Incomplete evacuation. Notes small sized stools like Angelica Kelley pinky. Uses Miralax 17 grams and Senna daily. Angelica Kelley will use magnesium citrate for progressive constipation. Angelica Kelley is unable to remember the last time that Angelica Kelley used it.  Angelica Kelley remembers having a colonoscopy but can't remember when. Records suggest a colonoscopy with Dr. Allyn Kenner in 2013, but, I do not see a full procedure note in [place].   Labs 08/19/17: iron 31, ferritin 119 Labs 01/11/19: iron <5, ferritin 9 Labs 01/20/19: gastrin 196 Labs 01/21/19: hemoglobin 7.8, MCV 91.5, RDW 15.4 Labs 01/27/19: normal CMP with BUN 11, creatinine 0.6, WBC 6.5, hgb 10, platelets 270, MCV 102, RDW 15 Labs 02/05/19: hgb 9.8, MCV 86, RDW 14.2 Labs 02/19/19: hgb 10.9, MCV 80.4, RDW 17.4, platelets 174, iron 33, ferritin 12.9  No known family history of colon cancer or polyps. No family history of uterine/endometrial cancer, pancreatic cancer  or gastric/stomach cancer.   Past Medical History:  Diagnosis Date  . Alcoholism ([place])   . Allergy   . Anemia 01/12/2019   ACUTE BLOOD LOSS   . Anxiety   . Arthritis   . Atrial fibrillation ([place]) 09/07/2017   CHADS2Vasc of 4  Patient is not a candidate for anticoagulation at present primarily due to Angelica Kelley inability to care for self at home and unwillingness to allow supervision (an ongong issue, APS has been involved).  If this were to change (I.e. Angelica Kelley were to enter assisted living or have live in aide), considerations may change, and anticoagulation would be recommended.    Angelica Kelley is alternately on ASA 32  . Blood transfusion without reported diagnosis   . Cataract   . CHF (congestive heart failure) ([place])   . Dementia ([place])   . Depression   . Elevated hemoglobin A1c   . Fracture of right wrist   . GERD (gastroesophageal reflux disease)   . Heart murmur   . Hyperlipidemia   . Hypertension   . IBS (irritable bowel syndrome)   . TIA (transient ischemic attack) 06/18/2017    Past Surgical History:  Procedure Laterality Date  . ABDOMINAL HYSTERECTOMY  1991  . APPENDECTOMY    . BIOPSY  01/12/2019   Procedure: BIOPSY;  Surgeon: Thornton Park, MD;  Location: [facility];  Service: Gastroenterology;;  . BIOPSY  01/19/2019   Procedure: BIOPSY;  Surgeon: Thornton Park, MD;  Location:  [facility];  Service: Gastroenterology;;  . BREAST BIOPSY Right 05/2016   fat necrosis  . BREAST SURGERY Left 1989   [facility]  . CARPAL TUNNEL RELEASE Right 10/18/2014   Procedure: CARPAL TUNNEL RELEASE;  Surgeon: Dorna Leitz, MD;  Location: [facility];  Service: Orthopedics;  Laterality: Right;  . [place]  . ESOPHAGOGASTRODUODENOSCOPY  01/12/2019  . ESOPHAGOGASTRODUODENOSCOPY Left 01/12/2019   Procedure: ESOPHAGOGASTRODUODENOSCOPY (EGD);  Surgeon: Thornton Park, MD;  Location: [facility];  Service: Gastroenterology;  Laterality: Left;  . ESOPHAGOGASTRODUODENOSCOPY (EGD) WITH PROPOFOL  N/A 01/14/2019   Procedure: ESOPHAGOGASTRODUODENOSCOPY (EGD) WITH PROPOFOL;  Surgeon: Thornton Park, MD;  Location: [facility];  Service: Gastroenterology;  Laterality: N/A;  . ESOPHAGOGASTRODUODENOSCOPY (EGD) WITH PROPOFOL N/A 01/19/2019   Procedure: ESOPHAGOGASTRODUODENOSCOPY (EGD) WITH PROPOFOL;  Surgeon: Thornton Park, MD;  Location: [facility];  Service: Gastroenterology;  Laterality: N/A;  . IR ANGIOGRAM SELECTIVE EACH ADDITIONAL VESSEL  01/15/2019  . IR ANGIOGRAM SELECTIVE EACH ADDITIONAL VESSEL  01/15/2019  . IR ANGIOGRAM VISCERAL SELECTIVE  01/15/2019  . IR ANGIOGRAM VISCERAL SELECTIVE  01/15/2019  . IR ANGIOGRAM VISCERAL SELECTIVE  01/15/2019  . IR EMBO ART  VEN HEMORR LYMPH EXTRAV  INC GUIDE ROADMAPPING  01/15/2019  . IR US GUIDE VASC ACCESS RIGHT  01/15/2019  . NECK SURGERY  Remote    Ruptured disc, per patient. Got infected.   . ORIF WRIST FRACTURE Right 10/18/2014   Procedure: OPEN REDUCTION INTERNAL FIXATION (ORIF) RIGHT WRIST FRACTURE;  Surgeon: Dorna Leitz, MD;  Location: [facility];  Service: Orthopedics;  Laterality: Right;  . TONSILLECTOMY  1965  . TUBAL LIGATION      Current Outpatient Medications  Medication Sig Dispense Refill  . acetaminophen (TYLENOL) 325 MG tablet Take 650 mg by mouth every 6 (six) hours as needed for moderate pain.    . ARIPiprazole (ABILIFY) 5 MG tablet Take 5 mg by mouth daily.    . cholecalciferol (VITAMIN D) 25 MCG (1000 UT) tablet Take 1,000 Units by mouth daily.    . clonazePAM (KLONOPIN) 0.5 MG tablet Take 0.5 mg by mouth daily as needed for anxiety.    . donepezil (ARICEPT) 10 MG tablet Take 10 mg by mouth at bedtime.    . gabapentin (NEURONTIN) 100 MG capsule Take 200 mg by mouth 2 (two) times daily.    . Hypromellose (ARTIFICIAL TEARS) 0.4 % SOLN Apply 2 drops to eye 2 (two) times a day.    . magnesium citrate (EQ MAGNESIUM CITRATE) SOLN Take 150 mLs by mouth daily as needed for mild constipation or severe constipation.    . Menthol, Topical  Analgesic, (BIOFREEZE) 4 % GEL Apply 1 application topically 2 (two) times a day. Left shoulder pain    . mirabegron ER (MYRBETRIQ) 25 MG TB24 tablet Take 25 mg by mouth daily.    . pantoprazole (PROTONIX) 40 MG tablet Take 1 tablet (40 mg total) by mouth 2 (two) times daily. 120 tablet 0  . polyethylene glycol (MIRALAX / GLYCOLAX) 17 g packet Take 17 g by mouth daily. 14 each 0  . rosuvastatin (CRESTOR) 40 MG tablet Take 40 mg by mouth daily.    . sennosides-docusate sodium (SENOKOT-S) 8.6-50 MG tablet Take 1 tablet by mouth daily.    . sertraline (ZOLOFT) 100 MG tablet Take 200 mg by mouth daily.   0  . thiamine (VITAMIN B-1) 100 MG tablet Take 250 mg by mouth 2 (two) times a day.    . zolpidem (AMBIEN) 5 MG tablet Take  5 mg by mouth at bedtime.     No current facility-administered medications for this visit.    Allergies as of 07/07/2019 - Review Complete 05/06/2019  Allergen Reaction Noted  . Lipitor [atorvastatin]  03/31/2013  . Prednisone Other (See Comments) 05/12/2014    Family History  Problem Relation Age of Onset  . Heart disease Mother   . Diabetes Mother   . Leukemia Father   . Heart disease Brother   . Cancer Brother   . Parkinson's disease Brother   . Colon cancer Neg Hx   . Esophageal cancer Neg Hx   . Rectal cancer Neg Hx   . Stomach cancer Neg Hx     Social History   Socioeconomic History  . Marital status: Divorced    Spouse name: Not on file  . Number of children: Not on file  . Years of education: Not on file  . Highest education level: Not on file  Occupational History  . Occupation: Retired  Tobacco Use  . Smoking status: Former Smoker    Types: Cigarettes    Quit date: 05/14/2010    Years since quitting: 9.1  . Smokeless tobacco: Never Used  . Tobacco comment: second hand smoke exposure also  Substance and Sexual Activity  . Alcohol use: Not Currently    Comment: Quit 2002, denies Angelica Kelley ever abused it.  . Drug use: No  . Sexual activity: Not  Currently  Other Topics Concern  . Not on file  Social History Narrative   07/05/17 Pt's niece, phone number [phone]. Pt lives alone at home, and doesn't use a cane or walker, is still pretty active. Never smoker.    1 daughter- deceased   Education- 24   Retired from [employer]   Caffeine- coffee,1 daily, soda 1 daily   Social Determinants of Radio broadcast assistant Strain:   . Difficulty of Paying Living Expenses: Not on file  Food Insecurity:   . Worried About Charity fundraiser in the Last Year: Not on file  . Ran Out of Food in the Last Year: Not on file  Transportation Needs:   . Lack of Transportation (Medical): Not on file  . Lack of Transportation (Non-Medical): Not on file  Physical Activity:   . Days of Exercise per Week: Not on file  . Minutes of Exercise per Session: Not on file  Stress:   . Feeling of Stress : Not on file  Social Connections:   . Frequency of Communication with Friends and Family: Not on file  . Frequency of Social Gatherings with Friends and Family: Not on file  . Attends Religious Services: Not on file  . Active Member of Clubs or Organizations: Not on file  . Attends Archivist Meetings: Not on file  . Marital Status: Not on file  Intimate Partner Violence:   . Fear of Current or Ex-Partner: Not on file  . Emotionally Abused: Not on file  . Physically Abused: Not on file  . Sexually Abused: Not on file   PHYSICAL EXAM: Gen: Awake, alert, and oriented, and well communicative. HEENT: EOMI, non-icteric sclera, NCAT, MMM  Neck: Normal movement of head and neck  Pulm: No labored breathing, speaking in full sentences without conversational dyspnea  Abd: Soft, NT, ND, normal bowel sounds without rebound or guarding Derm: No apparent lesions or bruising in visible field  MS: Moves all visible extremities without noticeable abnormality  Psych: Pleasant, cooperative, normal speech, thought processing seemingly intact  Roya Gieselman L. Tarri Glenn, MD, MPH 07/07/2019, 1:24 PM

## 2019-07-08 ENCOUNTER — Other Ambulatory Visit: Payer: Self-pay | Admitting: *Deleted

## 2019-07-08 ENCOUNTER — Telehealth: Payer: Self-pay | Admitting: *Deleted

## 2019-07-08 ENCOUNTER — Encounter: Payer: Self-pay | Admitting: Gastroenterology

## 2019-07-08 DIAGNOSIS — D509 Iron deficiency anemia, unspecified: Secondary | ICD-10-CM

## 2019-07-08 NOTE — Telephone Encounter (Signed)
Niece Jilda Panda called to request the short stay number to reschedule Feraheme infusion.

## 2019-07-14 NOTE — Discharge Instructions (Signed)

## 2019-07-15 ENCOUNTER — Ambulatory Visit (HOSPITAL_COMMUNITY)
Admission: RE | Admit: 2019-07-15 | Discharge: 2019-07-15 | Disposition: A | Discharge status: Home / Self Care | Payer: Medicare Other | Source: Ambulatory Visit | Attending: Gastroenterology | Admitting: Gastroenterology

## 2019-07-15 ENCOUNTER — Other Ambulatory Visit: Payer: Self-pay

## 2019-07-15 DIAGNOSIS — D509 Iron deficiency anemia, unspecified: Secondary | ICD-10-CM | POA: Insufficient documentation

## 2019-07-15 MED ORDER — SODIUM CHLORIDE 0.9 % IV SOLN
510.0000 mg | INTRAVENOUS | Status: DC
  Administered 2019-07-15: 510 mg via INTRAVENOUS
  Filled 2019-07-15: qty 17

## 2019-07-17 ENCOUNTER — Other Ambulatory Visit: Payer: Self-pay

## 2019-07-17 ENCOUNTER — Encounter: Payer: Self-pay | Admitting: Gastroenterology

## 2019-07-17 ENCOUNTER — Ambulatory Visit (AMBULATORY_SURGERY_CENTER): Payer: Medicare Other | Admitting: Gastroenterology

## 2019-07-17 VITALS — BP 125/54 | HR 60 | Temp 97.8°F | Resp 16 | Ht 66.0 in | Wt 140.0 lb

## 2019-07-17 DIAGNOSIS — R194 Change in bowel habit: Secondary | ICD-10-CM

## 2019-07-17 DIAGNOSIS — D125 Benign neoplasm of sigmoid colon: Secondary | ICD-10-CM

## 2019-07-17 DIAGNOSIS — D120 Benign neoplasm of cecum: Secondary | ICD-10-CM

## 2019-07-17 DIAGNOSIS — D123 Benign neoplasm of transverse colon: Secondary | ICD-10-CM

## 2019-07-17 DIAGNOSIS — K5730 Diverticulosis of large intestine without perforation or abscess without bleeding: Secondary | ICD-10-CM

## 2019-07-17 MED ORDER — SODIUM CHLORIDE 0.9 % IV SOLN: 500.0000 mL | Freq: Once | INTRAVENOUS | Status: DC

## 2019-07-17 NOTE — Progress Notes (Signed)
Called to room to assist during endoscopic procedure.  Patient ID and intended procedure confirmed with present staff. Received instructions for my participation in the procedure from the performing physician.  

## 2019-07-17 NOTE — Op Note (Signed)
[facility] Patient Name: Angelica Kelley Procedure Date: 07/17/2019 2:09 PM MRN: [id number] Endoscopist: Thornton Park MD, MD Age: 74 Referring MD:  Date of Birth: 02/21/1946 Gender: Female Account #: [id number] Procedure:                Colonoscopy Indications:              Unexplained iron deficiency anemia                           Neice reports history of polyps removed on prior                            colonoscopy Medicines:                Monitored Anesthesia Care Procedure:                Pre-Anesthesia Assessment:                           - Prior to the procedure, a History and Physical                            was performed, and patient medications and                            allergies were reviewed. The patient's tolerance of                            previous anesthesia was also reviewed. The risks                            and benefits of the procedure and the sedation                            options and risks were discussed with the patient.                            All questions were answered, and informed consent                            was obtained. Prior Anticoagulants: The patient has                            taken no previous anticoagulant or antiplatelet                            agents. ASA Grade Assessment: III - A patient with                            severe systemic disease. After reviewing the risks                            and benefits, the patient was deemed in  satisfactory condition to undergo the procedure.                           After obtaining informed consent, the colonoscope                            was passed under direct vision. Throughout the                            procedure, the patient's blood pressure, pulse, and                            oxygen saturations were monitored continuously. The                            Colonoscope was introduced through the anus and                advanced to the 4 cm into the ileum. A second                            forward view of the right colon was performed. The                            colonoscopy was performed without difficulty. The                            patient tolerated the procedure well. The quality                            of the bowel preparation was good. The terminal                            ileum, ileocecal valve, appendiceal orifice, and                            rectum were photographed. Scope In: 2:27:06 PM Scope Out: 2:50:20 PM Scope Withdrawal Time: 0 hours 11 minutes 40 seconds  Total Procedure Duration: 0 hours 23 minutes 14 seconds  Findings:                 The perianal and digital rectal examinations were                            normal.                           A 6 mm polyp was found in the splenic flexure. The                            polyp was sessile. The polyp was removed with a                            cold snare. Resection and retrieval were complete.  Estimated blood loss was minimal.                           A 4 mm polyp was found in the cecum. The polyp was                            sessile. The polyp was removed with a cold snare.                            Resection and retrieval were complete. Estimated                            blood loss was minimal.                           Two sessile polyps were found in the hepatic                            flexure. The polyps were 2 to 3 mm in size. These                            polyps were removed with a cold snare. Resection                            and retrieval were complete. Estimated blood loss                            was minimal.                           A 1 mm polyp was found in the sigmoid colon. The                            polyp was sessile. The polyp was removed with a                            cold snare. Resection and retrieval were complete.                             Estimated blood loss was minimal.                           A few small-mouthed diverticula were found in the                            sigmoid colon and descending colon.                           The exam was otherwise without abnormality on                            direct and retroflexion views. Complications:            No immediate complications. Estimated blood  loss:                            Minimal. Estimated Blood Loss:     Estimated blood loss was minimal. Impression:               - One 6 mm polyp at the splenic flexure, removed                            with a cold snare. Resected and retrieved.                           - One 4 mm polyp in the cecum, removed with a cold                            snare. Resected and retrieved.                           - Two 2 to 3 mm polyps at the hepatic flexure,                            removed with a cold snare. Resected and retrieved.                           - One 1 mm polyp in the sigmoid colon, removed with                            a cold snare. Resected and retrieved.                           - Diverticulosis in the sigmoid colon and in the                            descending colon.                           - The examination was otherwise normal on direct                            and retroflexion views. Recommendation:           - Patient has a contact number available for                            emergencies. The signs and symptoms of potential                            delayed complications were discussed with the                            patient. Return to normal activities tomorrow.                            Written discharge instructions were provided to the  patient.                           - High fiber diet.                           - Continue present medications.                           - Await pathology results.                           - Repeat colonoscopy date  to be determined after                            pending pathology results are reviewed for                            surveillance. Would proceed with surveillance                            colonoscopy if it is clinically appropriate at that                            time.                           - Follow-up in the clinic in 8-10 weeks. Thornton Park MD, MD 07/17/2019 3:03:18 PM This report has been signed electronically.

## 2019-07-17 NOTE — Patient Instructions (Signed)
Handouts on polyps, diverticulosis, and high fiber diet given to you today.  Await pathology results     YOU HAD AN ENDOSCOPIC PROCEDURE TODAY AT THE [facility]:   Refer to the procedure report that was given to you for any specific questions about what was found during the examination.  If the procedure report does not answer your questions, please call your gastroenterologist to clarify.  If you requested that your care partner not be given the details of your procedure findings, then the procedure report has been included in a sealed envelope for you to review at your convenience later.  YOU SHOULD EXPECT: Some feelings of bloating in the abdomen. Passage of more gas than usual.  Walking can help get rid of the air that was put into your GI tract during the procedure and reduce the bloating. If you had a lower endoscopy (such as a colonoscopy or flexible sigmoidoscopy) you may notice spotting of blood in your stool or on the toilet paper. If you underwent a bowel prep for your procedure, you may not have a normal bowel movement for a few days.  Please Note:  You might notice some irritation and congestion in your nose or some drainage.  This is from the oxygen used during your procedure.  There is no need for concern and it should clear up in a day or so.  SYMPTOMS TO REPORT IMMEDIATELY:   Following lower endoscopy (colonoscopy or flexible sigmoidoscopy):  Excessive amounts of blood in the stool  Significant tenderness or worsening of abdominal pains  Swelling of the abdomen that is new, acute  Fever of 100F or higher  For urgent or emergent issues, a gastroenterologist can be reached at any hour by calling [phone]. Do not use MyChart messaging for urgent concerns.    DIET:  We do recommend a small meal at first, but then you may proceed to your regular diet.  Drink plenty of fluids but you should avoid alcoholic beverages for 24 hours.  ACTIVITY:  You should plan  to take it easy for the rest of today and you should NOT DRIVE or use heavy machinery until tomorrow (because of the sedation medicines used during the test).    FOLLOW UP: Our staff will call the number listed on your records 48-72 hours following your procedure to check on you and address any questions or concerns that you may have regarding the information given to you following your procedure. If we do not reach you, we will leave a message.  We will attempt to reach you two times.  During this call, we will ask if you have developed any symptoms of COVID 19. If you develop any symptoms (ie: fever, flu-like symptoms, shortness of breath, cough etc.) before then, please call [phone].  If you test positive for Covid 19 in the 2 weeks post procedure, please call and report this information to us.    If any biopsies were taken you will be contacted by phone or by letter within the next 1-3 weeks.  Please call us at [phone] if you have not heard about the biopsies in 3 weeks.    SIGNATURES/CONFIDENTIALITY: You and/or your care partner have signed paperwork which will be entered into your electronic medical record.  These signatures attest to the fact that that the information above on your After Visit Summary has been reviewed and is understood.  Full responsibility of the confidentiality of this discharge information lies with you and/or your   care-partner.  

## 2019-07-17 NOTE — Progress Notes (Signed)
Temp-LC VS-DT 

## 2019-07-17 NOTE — Progress Notes (Signed)
PT taken to PACU. Monitors in place. VSS. Report given to RN. 

## 2019-07-20 ENCOUNTER — Other Ambulatory Visit: Payer: Self-pay

## 2019-07-21 ENCOUNTER — Telehealth: Payer: Self-pay | Admitting: *Deleted

## 2019-07-21 NOTE — Telephone Encounter (Signed)
  Follow up Call-  Call back number 07/17/2019 05/06/2019 03/12/2019  Post procedure Call Back phone  # [phone]  Permission to leave phone message Yes Yes Yes  Some recent data might be hidden     Patient questions:  Do you have a fever, pain , or abdominal swelling? No. Pain Score  0 *  Have you tolerated food without any problems? Yes.    Have you been able to return to your normal activities? Yes.    Do you have any questions about your discharge instructions: Diet   No. Medications  No. Follow up visit  No.  Do you have questions or concerns about your Care? No.  Actions: * If pain score is 4 or above: No action needed, pain <4.  1. Have you developed a fever since your procedure? no  2.   Have you had an respiratory symptoms (SOB or cough) since your procedure? no  3.   Have you tested positive for COVID 19 since your procedure no  4.   Have you had any family members/close contacts diagnosed with the COVID 19 since your procedure?  no   If yes to any of these questions please route to Joylene John, RN and Alphonsa Gin, Therapist, sports.

## 2019-07-22 ENCOUNTER — Encounter: Payer: Self-pay | Admitting: Gastroenterology

## 2019-07-22 ENCOUNTER — Other Ambulatory Visit: Payer: Self-pay

## 2019-07-22 ENCOUNTER — Ambulatory Visit (HOSPITAL_COMMUNITY)
Admission: RE | Admit: 2019-07-22 | Discharge: 2019-07-22 | Disposition: A | Discharge status: Home / Self Care | Payer: Medicare Other | Source: Ambulatory Visit | Attending: Gastroenterology | Admitting: Gastroenterology

## 2019-07-22 DIAGNOSIS — D509 Iron deficiency anemia, unspecified: Secondary | ICD-10-CM | POA: Insufficient documentation

## 2019-07-22 MED ORDER — SODIUM CHLORIDE 0.9 % IV SOLN
510.0000 mg | INTRAVENOUS | Status: DC
  Administered 2019-07-22: 510 mg via INTRAVENOUS
  Filled 2019-07-22: qty 17

## 2019-08-11 ENCOUNTER — Ambulatory Visit: Payer: Medicare Other | Admitting: Cardiology

## 2019-08-12 ENCOUNTER — Ambulatory Visit: Payer: Medicare Other | Admitting: Physician Assistant

## 2019-08-19 ENCOUNTER — Telehealth: Payer: Self-pay | Admitting: Physician Assistant

## 2019-08-19 NOTE — Telephone Encounter (Signed)
Patient's niece calling to request she come with the patient to her appointment tomorrow 08/20/2019, because she states the patient has dementia.

## 2019-08-19 NOTE — Telephone Encounter (Signed)
Returned call to Niece, [name].  She has been made aware that she can accompany pt to appt.  Pt has dimentia. She was thankful for the call back.

## 2019-08-20 ENCOUNTER — Ambulatory Visit (INDEPENDENT_AMBULATORY_CARE_PROVIDER_SITE_OTHER): Payer: Medicare Other | Admitting: Physician Assistant

## 2019-08-20 ENCOUNTER — Encounter: Payer: Self-pay | Admitting: Physician Assistant

## 2019-08-20 ENCOUNTER — Other Ambulatory Visit: Payer: Self-pay

## 2019-08-20 VITALS — BP 130/76 | HR 64 | Ht 66.0 in | Wt 144.2 lb

## 2019-08-20 DIAGNOSIS — I350 Nonrheumatic aortic (valve) stenosis: Secondary | ICD-10-CM

## 2019-08-20 DIAGNOSIS — E785 Hyperlipidemia, unspecified: Secondary | ICD-10-CM | POA: Diagnosis not present

## 2019-08-20 DIAGNOSIS — Z9181 History of falling: Secondary | ICD-10-CM

## 2019-08-20 DIAGNOSIS — I480 Paroxysmal atrial fibrillation: Secondary | ICD-10-CM | POA: Diagnosis not present

## 2019-08-20 NOTE — Progress Notes (Signed)
Cardiology Office Note    Date:  08/20/2019   ID:  Angelica Kelley, DOB [birth year], MRN [id number]  PCP:  [facility]  Cardiologist:  Dr. Acie Fredrickson  Chief Complaint: 6 Months follow up  History of Present Illness:   Angelica Kelley is a 74 y.o. female  with a past medical history significant for hypertension, hyperlipidemia, GERD, bipolar, TIA, prediabetes, murmur, alcoholism, IBS, subdural hematoma 06/2016, multiple falls,.GI bleed, moderate AS, and PAF seen for follow up.   The patient lives at [facility] assisted living.  Admitted 08/2017 for altered mental status and rhabdomyolysis after being found on the floor for long period of time.  New finding of A. fib with RVR. She was also noted to have possible benzodiazepine overdose superimposed on progressing dementia.  Also with history of GI bleed secondary to large NSAID-related DU s/p GDA embolization 01/15/19. Required multiple transfusion.   Last seen by APP 01/2019.  Here today for follow-up with niece who is power of attorney.  Facility managed patient's medication.  She denies chest pain, shortness of breath, orthopnea, PND, syncope, lower extremity edema or melena.  No falls recently.  No regular exercise.  Some walking.  Past Medical History:  Diagnosis Date  . Alcoholism ([place])   . Allergy   . Anemia 01/12/2019   ACUTE BLOOD LOSS   . Anxiety   . Arthritis   . Atrial fibrillation ([place]) 09/07/2017   CHADS2Vasc of 4  Patient is not a candidate for anticoagulation at present primarily due to her inability to care for self at home and unwillingness to allow supervision (an ongong issue, APS has been involved).  If this were to change (I.e. She were to enter assisted living or have live in aide), considerations may change, and anticoagulation would be recommended.    She is alternately on ASA 32  . Blood transfusion without reported diagnosis   . Cataract   . CHF (congestive heart failure) ([place])   . Dementia  ([place])   . Depression   . Elevated hemoglobin A1c   . Fracture of right wrist   . GERD (gastroesophageal reflux disease)   . Heart murmur   . Hyperlipidemia   . Hypertension   . IBS (irritable bowel syndrome)   . TIA (transient ischemic attack) 06/18/2017    Past Surgical History:  Procedure Laterality Date  . ABDOMINAL HYSTERECTOMY  1991  . APPENDECTOMY    . BIOPSY  01/12/2019   Procedure: BIOPSY;  Surgeon: Thornton Park, MD;  Location: [place];  Service: Gastroenterology;;  . BIOPSY  01/19/2019   Procedure: BIOPSY;  Surgeon: Thornton Park, MD;  Location: [place];  Service: Gastroenterology;;  . BREAST BIOPSY Right 05/2016   fat necrosis  . BREAST SURGERY Left 1989   [facility]  . CARPAL TUNNEL RELEASE Right 10/18/2014   Procedure: CARPAL TUNNEL RELEASE;  Surgeon: Dorna Leitz, MD;  Location: [place];  Service: Orthopedics;  Laterality: Right;  . [place]  . ESOPHAGOGASTRODUODENOSCOPY  01/12/2019  . ESOPHAGOGASTRODUODENOSCOPY Left 01/12/2019   Procedure: ESOPHAGOGASTRODUODENOSCOPY (EGD);  Surgeon: Thornton Park, MD;  Location: [place];  Service: Gastroenterology;  Laterality: Left;  . ESOPHAGOGASTRODUODENOSCOPY (EGD) WITH PROPOFOL N/A 01/14/2019   Procedure: ESOPHAGOGASTRODUODENOSCOPY (EGD) WITH PROPOFOL;  Surgeon: Thornton Park, MD;  Location: [place];  Service: Gastroenterology;  Laterality: N/A;  . ESOPHAGOGASTRODUODENOSCOPY (EGD) WITH PROPOFOL N/A 01/19/2019   Procedure: ESOPHAGOGASTRODUODENOSCOPY (EGD) WITH PROPOFOL;  Surgeon: Thornton Park, MD;  Location: [place];  Service: Gastroenterology;  Laterality: N/A;  . IR ANGIOGRAM SELECTIVE EACH ADDITIONAL VESSEL  01/15/2019  . IR ANGIOGRAM SELECTIVE EACH ADDITIONAL VESSEL  01/15/2019  . IR ANGIOGRAM VISCERAL SELECTIVE  01/15/2019  . IR ANGIOGRAM VISCERAL SELECTIVE  01/15/2019  . IR ANGIOGRAM VISCERAL SELECTIVE  01/15/2019  . IR EMBO ART  VEN HEMORR LYMPH EXTRAV  INC GUIDE ROADMAPPING  01/15/2019   . IR US GUIDE VASC ACCESS RIGHT  01/15/2019  . NECK SURGERY  Remote    Ruptured disc, per patient. Got infected.   . ORIF WRIST FRACTURE Right 10/18/2014   Procedure: OPEN REDUCTION INTERNAL FIXATION (ORIF) RIGHT WRIST FRACTURE;  Surgeon: Dorna Leitz, MD;  Location: [place];  Service: Orthopedics;  Laterality: Right;  . TONSILLECTOMY  1965  . TUBAL LIGATION      Current Medications:  Prior to Admission medications   Medication Sig Start Date End Date Taking? Authorizing Provider  acetaminophen (TYLENOL) 325 MG tablet Take 650 mg by mouth every 6 (six) hours as needed for moderate pain.   Yes [provider]  ARIPiprazole (ABILIFY) 5 MG tablet Take 5 mg by mouth daily.   Yes [provider]  cholecalciferol (VITAMIN D) 25 MCG (1000 UT) tablet Take 1,000 Units by mouth daily.   Yes [provider]  clonazePAM (KLONOPIN) 0.5 MG tablet Take 0.5 mg by mouth daily as needed for anxiety.   Yes [provider]  donepezil (ARICEPT) 10 MG tablet Take 10 mg by mouth at bedtime.   Yes [provider]  gabapentin (NEURONTIN) 100 MG capsule Take 200 mg by mouth 2 (two) times daily.   Yes [provider]  Hypromellose (ARTIFICIAL TEARS) 0.4 % SOLN Apply 2 drops to eye 2 (two) times a day.   Yes [provider]  levocetirizine (XYZAL) 5 MG tablet Take 5 mg by mouth every evening.   Yes [provider]  magnesium citrate (EQ MAGNESIUM CITRATE) SOLN Take 150 mLs by mouth daily as needed for mild constipation or severe constipation.   Yes [provider]  Menthol, Topical Analgesic, (BIOFREEZE) 4 % GEL Apply 1 application topically 2 (two) times a day. Left shoulder pain   Yes [provider]  mirabegron ER (MYRBETRIQ) 25 MG TB24 tablet Take 25 mg by mouth daily.   Yes [provider]  pantoprazole (PROTONIX) 40 MG tablet Take 1 tablet (40 mg total) by mouth 2 (two) times daily. 01/21/19 07/06/20 Yes Regalado, [name],  MD  polyethylene glycol (MIRALAX / GLYCOLAX) 17 g packet Take 17 g by mouth daily. 10/10/18  Yes [name], PA-C  rosuvastatin (CRESTOR) 40 MG tablet Take 40 mg by mouth daily.   Yes [provider]  sennosides-docusate sodium (SENOKOT-S) 8.6-50 MG tablet Take 1 tablet by mouth daily.   Yes [provider]  sertraline (ZOLOFT) 100 MG tablet Take 200 mg by mouth daily.  04/11/15  Yes [provider]  thiamine (VITAMIN B-1) 100 MG tablet Take 250 mg by mouth 2 (two) times a day.   Yes [provider]  zolpidem (AMBIEN) 5 MG tablet Take 5 mg by mouth at bedtime.   Yes [provider]     Allergies:   Lipitor [atorvastatin] and Prednisone   Social History   Socioeconomic History  . Marital status: Divorced    Spouse name: Not on file  . Number of children: Not on file  . Years of education: Not on file  . Highest education level: Not on file  Occupational History  .  Occupation: Retired  Tobacco Use  . Smoking status: Former Smoker    Types: Cigarettes    Quit date: 05/14/2010    Years since quitting: 9.2  . Smokeless tobacco: Never Used  . Tobacco comment: second hand smoke exposure also  Substance and Sexual Activity  . Alcohol use: Not Currently    Comment: Quit 2002, denies she ever abused it.  . Drug use: No  . Sexual activity: Not Currently  Other Topics Concern  . Not on file  Social History Narrative   07/05/17 Pt's niece, phone number [phone]. Pt lives alone at home, and doesn't use a cane or walker, is still pretty active. Never smoker.    1 daughter- deceased   Education- 1   Retired from [employer]   Caffeine- coffee,1 daily, soda 1 daily   Social Determinants of Radio broadcast assistant Strain:   . Difficulty of Paying Living Expenses:   Food Insecurity:   . Worried About Charity fundraiser in the Last Year:   . Arboriculturist in the Last Year:   Transportation Needs:   . Film/video editor  (Medical):   Marland Kitchen Lack of Transportation (Non-Medical):   Physical Activity:   . Days of Exercise per Week:   . Minutes of Exercise per Session:   Stress:   . Feeling of Stress :   Social Connections:   . Frequency of Communication with Friends and Family:   . Frequency of Social Gatherings with Friends and Family:   . Attends Religious Services:   . Active Member of Clubs or Organizations:   . Attends Archivist Meetings:   Marland Kitchen Marital Status:      Family History:  The patient's family history includes Cancer in her brother; Diabetes in her mother; Heart disease in her brother and mother; Leukemia in her father; Parkinson's disease in her brother.   ROS:   Please see the history of present illness.    ROS All other systems reviewed and are negative.   PHYSICAL EXAM:   VS:  BP 130/76   Pulse 64   Ht 5\' 6"  (1.676 m)   Wt 144 lb 3.2 oz (65.4 kg)   SpO2 97%   BMI 23.27 kg/m    GEN: Well nourished, well developed, in no acute distress  HEENT: normal  Neck: no JVD, carotid bruits, or masses Cardiac: [id number] systolic  murmurs, rubs, or gallops,no edema  Respiratory:  clear to auscultation bilaterally, normal work of breathing GI: soft, nontender, nondistended, + BS MS: no deformity or atrophy  Skin: warm and dry, no rash Neuro:  Alert and Oriented x 3, Strength and sensation are intact Psych: euthymic mood, full affect  Wt Readings from Last 3 Encounters:  08/20/19 144 lb 3.2 oz (65.4 kg)  07/17/19 140 lb (63.5 kg)  07/15/19 140 lb (63.5 kg)      Studies/Labs Reviewed:   EKG:  EKG is ordered today.  The ekg ordered today demonstrates normal sinus rhythm at rate of 64 bpm, PAC  Recent Labs: 01/11/2019: ALT 8 01/12/2019: Magnesium 2.3 01/20/2019: BUN 10; Creatinine, Ser 0.87; Potassium 3.1; Sodium 141 07/07/2019: Hemoglobin 11.0; Platelets 170.0; TSH 1.16   Lipid Panel    Component Value Date/Time   CHOL 174 08/07/2017 1528   TRIG 113 08/07/2017 1528   HDL 70  08/07/2017 1528   CHOLHDL 2.5 08/07/2017 1528   VLDL 25 05/10/2017 0442   LDLCALC 83 08/07/2017 1528    Additional  studies/ records that were reviewed today include:   Echocardiogram: 02/2019 1. The aortic valve is tricuspid. Moderate calcifications. Aortic valve  regurgitation was not visualized by color flow Doppler. Moderate aortic  valve stenosis (Vmax 3.1 m/s, MG 21, AVA 1.3, DI 0.38)  2. Left ventricular ejection fraction, by visual estimation, is 60 to  65%. The left ventricle has normal function. There is mildly increased  left ventricular hypertrophy.  3. The average left ventricular global longitudinal strain is -21.3 %.  4. Left ventricular diastolic Doppler parameters are consistent with  pseudonormalization pattern of LV diastolic filling.  5. Elevated mean left atrial pressure.  6. Global right ventricle has normal systolic function.The right  ventricular size is normal. No increase in right ventricular wall  thickness.  7. Left atrial size was severely dilated.  8. Right atrial size was normal.  9. The mitral valve is normal in structure. No evidence of mitral valve  regurgitation.  10. The inferior vena cava is normal in size with greater than 50%  respiratory variability, suggesting right atrial pressure of 3 mmHg.      ASSESSMENT & PLAN:    1. Paroxysmal atrial fibrillation Solitary episode 08/2017 in setting of rhabdomyolysis after being down for long period of time.  Not a anticoagulation candidate given multiple history of fall, subdural hematoma, dementia and GI bleed.  Maintaining sinus rhythm.  2.  Advanced dementia with frequent falls -Per niece no recent falls.  3.  Anemia secondary to GI bleed (NSAID induced peptic ulcer disease) and IDA - Previously required multiple transfusion - Followed by GI  4. Moderate AS - Last echo 02/2019 showed moderate aortic valve stenosis (Vmax 3.1 m/s, MG 21, AVA 1.3, DI 0.38)  -No symptoms concerning for  heart failure. -No syncope -Likely medical therapy going forward with symptomatic management.  Reviewed with niece.  May need to repeat echocardiogram if symptomatic to guide therapy  No change in her medication today.  Follow-up with Dr. Acie Fredrickson in 1 year.  Medication Adjustments/Labs and Tests Ordered: Current medicines are reviewed at length with the patient today.  Concerns regarding medicines are outlined above.  Medication changes, Labs and Tests ordered today are listed in the Patient Instructions below. Patient Instructions  Medication Instructions:  Your physician recommends that you continue on your current medications as directed. Please refer to the Current Medication list given to you today.  *If you need a refill on your cardiac medications before your next appointment, please call your pharmacy*   Lab Work: None ordered  If you have labs (blood work) drawn today and your tests are completely normal, you will receive your results only by: Marland Kitchen MyChart Message (if you have MyChart) OR . A paper copy in the mail If you have any lab test that is abnormal or we need to change your treatment, we will call you to review the results.   Testing/Procedures: None ordered   Follow-Up: At [facility], you and your health needs are our priority.  As part of our continuing mission to provide you with exceptional heart care, we have created designated Provider Care Teams.  These Care Teams include your primary Cardiologist (physician) and Advanced Practice Providers (APPs -  Physician Assistants and Nurse Practitioners) who all work together to provide you with the care you need, when you need it.  We recommend signing up for the patient portal called "MyChart".  Sign up information is provided on this After Visit Summary.  MyChart is used to connect  with patients for Virtual Visits (Telemedicine).  Patients are able to view lab/test results, encounter notes, upcoming appointments, etc.   Non-urgent messages can be sent to your provider as well.   To learn more about what you can do with MyChart, go to [email or web address].    Your next appointment:   12 month(s)  The format for your next appointment:   In Person  Provider:   You may see Mertie Moores, MD or one of the following Advanced Practice Providers on your designated Care Team:    Richardson Dopp, PA-C  [place], [place]  Daune Perch, NP    Other Instructions      Jarrett Soho, [place]  08/20/2019 2:41 PM    [address], [place], [place]  [id number] Phone: [phone]; Fax: [phone]

## 2019-08-20 NOTE — Patient Instructions (Signed)
Medication Instructions:  Your physician recommends that you continue on your current medications as directed. Please refer to the Current Medication list given to you today.  *If you need a refill on your cardiac medications before your next appointment, please call your pharmacy*   Lab Work: None ordered  If you have labs (blood work) drawn today and your tests are completely normal, you will receive your results only by: Marland Kitchen MyChart Message (if you have MyChart) OR . A paper copy in the mail If you have any lab test that is abnormal or we need to change your treatment, we will call you to review the results.   Testing/Procedures: None ordered   Follow-Up: At [facility], you and your health needs are our priority.  As part of our continuing mission to provide you with exceptional heart care, we have created designated Provider Care Teams.  These Care Teams include your primary Cardiologist (physician) and Advanced Practice Providers (APPs -  Physician Assistants and Nurse Practitioners) who all work together to provide you with the care you need, when you need it.  We recommend signing up for the patient portal called "MyChart".  Sign up information is provided on this After Visit Summary.  MyChart is used to connect with patients for Virtual Visits (Telemedicine).  Patients are able to view lab/test results, encounter notes, upcoming appointments, etc.  Non-urgent messages can be sent to your provider as well.   To learn more about what you can do with MyChart, go to [email or web address].    Your next appointment:   12 month(s)  The format for your next appointment:   In Person  Provider:   You may see Mertie Moores, MD or one of the following Advanced Practice Providers on your designated Care Team:    Richardson Dopp, PA-C  [place], [place]  Daune Perch, NP    Other Instructions

## 2019-09-08 ENCOUNTER — Ambulatory Visit: Payer: Medicare Other | Admitting: Gastroenterology

## 2019-09-10 ENCOUNTER — Ambulatory Visit: Payer: Medicare Other | Admitting: Gastroenterology

## 2019-11-19 ENCOUNTER — Ambulatory Visit: Payer: Medicare Other | Admitting: Gastroenterology

## 2019-12-07 ENCOUNTER — Other Ambulatory Visit: Payer: Self-pay

## 2019-12-07 ENCOUNTER — Other Ambulatory Visit: Payer: Self-pay | Admitting: Gerontology

## 2019-12-07 DIAGNOSIS — Z1231 Encounter for screening mammogram for malignant neoplasm of breast: Secondary | ICD-10-CM

## 2020-02-16 ENCOUNTER — Ambulatory Visit: Payer: Medicare Other

## 2020-03-03 ENCOUNTER — Ambulatory Visit
Admission: RE | Admit: 2020-03-03 | Discharge: 2020-03-03 | Disposition: A | Discharge status: Home / Self Care | Payer: Medicare Other | Source: Ambulatory Visit | Attending: Gerontology | Admitting: Gerontology

## 2020-03-03 ENCOUNTER — Other Ambulatory Visit: Payer: Self-pay

## 2020-03-03 DIAGNOSIS — Z1231 Encounter for screening mammogram for malignant neoplasm of breast: Secondary | ICD-10-CM

## 2020-09-06 ENCOUNTER — Encounter: Payer: Self-pay | Admitting: Physician Assistant

## 2020-09-06 ENCOUNTER — Ambulatory Visit: Payer: Medicare Other | Admitting: Physician Assistant

## 2020-09-06 ENCOUNTER — Ambulatory Visit (INDEPENDENT_AMBULATORY_CARE_PROVIDER_SITE_OTHER): Payer: Medicare Other | Admitting: Physician Assistant

## 2020-09-06 ENCOUNTER — Other Ambulatory Visit: Payer: Self-pay

## 2020-09-06 VITALS — BP 130/70 | HR 60 | Ht 66.0 in | Wt 150.8 lb

## 2020-09-06 DIAGNOSIS — E785 Hyperlipidemia, unspecified: Secondary | ICD-10-CM | POA: Diagnosis not present

## 2020-09-06 DIAGNOSIS — I480 Paroxysmal atrial fibrillation: Secondary | ICD-10-CM | POA: Diagnosis not present

## 2020-09-06 DIAGNOSIS — I350 Nonrheumatic aortic (valve) stenosis: Secondary | ICD-10-CM

## 2020-09-06 NOTE — Progress Notes (Signed)
Cardiology Office Note:    Date:  09/06/2020   ID:  Angelica Kelley, DOB 07-15-1945, MRN [id number]  PCP:  Orvis Brill, [facility] Cardiologist:  Mertie Moores, MD  [facility] Electrophysiologist:  None   Chief Complaint: Yearly follow up   History of Present Illness:    Angelica Kelley is a 75 y.o. female with a hx of hypertension, hyperlipidemia, GERD, bipolar, TIA, prediabetes, murmur, alcoholism, IBS, subdural hematoma 06/2016, multiple falls,.GI bleed, moderate AS, and PAF seen for follow up.   [facility]  Admitted 08/2017 for altered mental status and rhabdomyolysis after being found on the floor for long period of time. New finding of A. fib with RVR. She was also noted to have possible benzodiazepine overdose superimposed on progressing dementia.  Also with history of GI bleed secondary to large NSAID-related DU s/p GDA embolization 01/15/19. Required multiple transfusion.   Last seen by me 08/20/2019.  Patient is presented with niece who is power of attorney.  Patient denies chest pain, shortness of breath, orthopnea, PND, syncope, lower extremity edema or melena.  No longer on a blood pressure medication.  She walks around the facility without any issue.  Past Medical History:  Diagnosis Date  . Alcoholism ([place])   . Allergy   . Anemia 01/12/2019   ACUTE BLOOD LOSS   . Anxiety   . Arthritis   . Atrial fibrillation ([place]) 09/07/2017   CHADS2Vasc of 4  Patient is not a candidate for anticoagulation at present primarily due to her inability to care for self at home and unwillingness to allow supervision (an ongong issue, APS has been involved).  If this were to change (I.e. She were to enter assisted living or have live in aide), considerations may change, and anticoagulation would be recommended.    She is alternately on ASA 32  . Blood transfusion without reported diagnosis   . Cataract   . CHF (congestive heart  failure) ([place])   . Dementia ([place])   . Depression   . Elevated hemoglobin A1c   . Fracture of right wrist   . GERD (gastroesophageal reflux disease)   . Heart murmur   . Hyperlipidemia   . Hypertension   . IBS (irritable bowel syndrome)   . TIA (transient ischemic attack) 06/18/2017    Past Surgical History:  Procedure Laterality Date  . ABDOMINAL HYSTERECTOMY  1991  . APPENDECTOMY    . BIOPSY  01/12/2019   Procedure: BIOPSY;  Surgeon: Thornton Park, MD;  Location: [place];  Service: Gastroenterology;;  . BIOPSY  01/19/2019   Procedure: BIOPSY;  Surgeon: Thornton Park, MD;  Location: [place];  Service: Gastroenterology;;  . BREAST BIOPSY Right 05/2016   fat necrosis  . BREAST SURGERY Left 1989   [facility]  . CARPAL TUNNEL RELEASE Right 10/18/2014   Procedure: CARPAL TUNNEL RELEASE;  Surgeon: Dorna Leitz, MD;  Location: [place];  Service: Orthopedics;  Laterality: Right;  . [place]  . ESOPHAGOGASTRODUODENOSCOPY  01/12/2019  . ESOPHAGOGASTRODUODENOSCOPY Left 01/12/2019   Procedure: ESOPHAGOGASTRODUODENOSCOPY (EGD);  Surgeon: Thornton Park, MD;  Location: [place];  Service: Gastroenterology;  Laterality: Left;  . ESOPHAGOGASTRODUODENOSCOPY (EGD) WITH PROPOFOL N/A 01/14/2019   Procedure: ESOPHAGOGASTRODUODENOSCOPY (EGD) WITH PROPOFOL;  Surgeon: Thornton Park, MD;  Location: [place];  Service: Gastroenterology;  Laterality: N/A;  . ESOPHAGOGASTRODUODENOSCOPY (EGD) WITH PROPOFOL N/A 01/19/2019   Procedure: ESOPHAGOGASTRODUODENOSCOPY (EGD) WITH PROPOFOL;  Surgeon: Thornton Park, MD;  Location: [place];  Service: Gastroenterology;  Laterality: N/A;  . IR ANGIOGRAM SELECTIVE EACH ADDITIONAL VESSEL  01/15/2019  . IR ANGIOGRAM SELECTIVE EACH ADDITIONAL VESSEL  01/15/2019  . IR ANGIOGRAM VISCERAL SELECTIVE  01/15/2019  . IR ANGIOGRAM VISCERAL SELECTIVE  01/15/2019  . IR ANGIOGRAM VISCERAL SELECTIVE  01/15/2019  . IR EMBO ART  VEN HEMORR LYMPH EXTRAV   INC GUIDE ROADMAPPING  01/15/2019  . IR US GUIDE VASC ACCESS RIGHT  01/15/2019  . NECK SURGERY  Remote    Ruptured disc, per patient. Got infected.   . ORIF WRIST FRACTURE Right 10/18/2014   Procedure: OPEN REDUCTION INTERNAL FIXATION (ORIF) RIGHT WRIST FRACTURE;  Surgeon: Dorna Leitz, MD;  Location: [place];  Service: Orthopedics;  Laterality: Right;  . TONSILLECTOMY  1965  . TUBAL LIGATION      Current Medications: Current Meds  Medication Sig  . acetaminophen (TYLENOL) 325 MG tablet Take 650 mg by mouth every 6 (six) hours as needed for moderate pain.  . ARIPiprazole (ABILIFY) 5 MG tablet Take 5 mg by mouth daily.  . cholecalciferol (VITAMIN D) 25 MCG (1000 UT) tablet Take 1,000 Units by mouth daily.  . clonazePAM (KLONOPIN) 0.5 MG tablet Take 0.5 mg by mouth daily as needed for anxiety.  . donepezil (ARICEPT) 10 MG tablet Take 10 mg by mouth at bedtime.  . gabapentin (NEURONTIN) 100 MG capsule Take 200 mg by mouth 2 (two) times daily.  . Hypromellose 0.4 % SOLN Apply 2 drops to eye 2 (two) times a day.  . levocetirizine (XYZAL) 5 MG tablet Take 5 mg by mouth every evening.  . magnesium citrate SOLN Take 150 mLs by mouth daily as needed for mild constipation or severe constipation.  . Menthol, Topical Analgesic, 4 % GEL Apply 1 application topically 2 (two) times a day. Left shoulder pain  . mirabegron ER (MYRBETRIQ) 25 MG TB24 tablet Take 25 mg by mouth daily.  . pantoprazole (PROTONIX) 40 MG tablet Take 1 tablet (40 mg total) by mouth 2 (two) times daily.  . polyethylene glycol (MIRALAX / GLYCOLAX) 17 g packet Take 17 g by mouth daily.  . rosuvastatin (CRESTOR) 40 MG tablet Take 40 mg by mouth daily.  . sennosides-docusate sodium (SENOKOT-S) 8.6-50 MG tablet Take 1 tablet by mouth daily.  . sertraline (ZOLOFT) 100 MG tablet Take 200 mg by mouth daily.   Marland Kitchen thiamine (VITAMIN B-1) 100 MG tablet Take 250 mg by mouth 2 (two) times a day.  . zolpidem (AMBIEN) 5 MG tablet Take 5 mg by mouth at  bedtime.     Allergies:   Lipitor [atorvastatin] and Prednisone   Social History   Socioeconomic History  . Marital status: Divorced    Spouse name: Not on file  . Number of children: Not on file  . Years of education: Not on file  . Highest education level: Not on file  Occupational History  . Occupation: Retired  Tobacco Use  . Smoking status: Former Smoker    Types: Cigarettes    Quit date: 05/14/2010    Years since quitting: 10.3  . Smokeless tobacco: Never Used  . Tobacco comment: second hand smoke exposure also  Vaping Use  . Vaping Use: Never used  Substance and Sexual Activity  . Alcohol use: Not Currently    Comment: Quit 2002, denies she ever abused it.  . Drug use: No  . Sexual activity: Not Currently  Other Topics Concern  . Not on file  Social History Narrative   07/05/17 Pt's  niece, phone number [phone]. Pt lives alone at home, and doesn't use a cane or walker, is still pretty active. Never smoker.    1 daughter- deceased   Education- 61   Retired from [employer]   Caffeine- coffee,1 daily, soda 1 daily   Social Determinants of Radio broadcast assistant Strain: Not on file  Food Insecurity: Not on file  Transportation Needs: Not on file  Physical Activity: Not on file  Stress: Not on file  Social Connections: Not on file     Family History: The patient's family history includes Cancer in her brother; Diabetes in her mother; Heart disease in her brother and mother; Leukemia in her father; Parkinson's disease in her brother. There is no history of Colon cancer, Esophageal cancer, Rectal cancer, Stomach cancer, or Colon polyps.    ROS:   Please see the history of present illness.    All other systems reviewed and are negative.   EKGs/Labs/Other Studies Reviewed:    The following studies were reviewed today:  Echocardiogram: 02/2019 1. The aortic valve is tricuspid. Moderate calcifications. Aortic valve  regurgitation was not  visualized by color flow Doppler. Moderate aortic  valve stenosis (Vmax 3.1 m/s, MG 21, AVA 1.3, DI 0.38)  2. Left ventricular ejection fraction, by visual estimation, is 60 to  65%. The left ventricle has normal function. There is mildly increased  left ventricular hypertrophy.  3. The average left ventricular global longitudinal strain is -21.3 %.  4. Left ventricular diastolic Doppler parameters are consistent with  pseudonormalization pattern of LV diastolic filling.  5. Elevated mean left atrial pressure.  6. Global right ventricle has normal systolic function.The right  ventricular size is normal. No increase in right ventricular wall  thickness.  7. Left atrial size was severely dilated.  8. Right atrial size was normal.  9. The mitral valve is normal in structure. No evidence of mitral valve  regurgitation.  10. The inferior vena cava is normal in size with greater than 50%  respiratory variability, suggesting right atrial pressure of 3 mmHg.     EKG:  EKG is ordered today.  The ekg ordered today demonstrates sinus rhythm at rate of 60 bpm  Recent Labs: No results found for requested labs within last 8760 hours.  Recent Lipid Panel    Component Value Date/Time   CHOL 174 08/07/2017 1528   TRIG 113 08/07/2017 1528   HDL 70 08/07/2017 1528   CHOLHDL 2.5 08/07/2017 1528   VLDL 25 05/10/2017 0442   LDLCALC 83 08/07/2017 1528    Physical Exam:    VS:  BP 130/70   Pulse 60   Ht 5\' 6"  (1.676 m)   Wt 150 lb 12.8 oz (68.4 kg)   SpO2 98%   BMI 24.34 kg/m     Wt Readings from Last 3 Encounters:  09/06/20 150 lb 12.8 oz (68.4 kg)  08/20/19 144 lb 3.2 oz (65.4 kg)  07/17/19 140 lb (63.5 kg)     GEN: Well nourished, well developed in no acute distress HEENT: Normal NECK: No JVD; No carotid bruits LYMPHATICS: No lymphadenopathy CARDIAC: OFB,5/1 systolic murmurs, rubs, gallops RESPIRATORY:  Clear to auscultation without rales, wheezing or rhonchi  ABDOMEN:  Soft, non-tender, non-distended MUSCULOSKELETAL:  No edema; No deformity  SKIN: Warm and dry NEUROLOGIC:  Alert and oriented x 3 PSYCHIATRIC:  Normal affect   ASSESSMENT AND PLAN:   1. Paroxysmal atrial fibrillation Solitary episode 08/2017 in setting of rhabdomyolysis after being down  for long period of time.  Not a anticoagulation candidate given multiple history of fall, subdural hematoma, dementia and GI bleed.  Maintaining sinus rhythm.  2.  Advanced dementia with frequent falls -No recent falls.  3.  Anemia secondary to GI bleed (NSAID induced peptic ulcer disease) and IDA - Previously required multiple transfusion - Followed by GI  4. Moderate AS - Last echo 02/2019 showed moderate aortic valve stenosis (Vmax 3.1 m/s, MG 21, AVA 1.3, DI 0.38)  -No symptoms concerning for heart failure. -No syncope -Likely medical therapy going forward with symptomatic management.     Medication Adjustments/Labs and Tests Ordered: Current medicines are reviewed at length with the patient today.  Concerns regarding medicines are outlined above.  Orders Placed This Encounter  Procedures  . EKG 12-Lead   No orders of the defined types were placed in this encounter.   Patient Instructions  Medication Instructions:  Your physician recommends that you continue on your current medications as directed. Please refer to the Current Medication list given to you today.  *If you need a refill on your cardiac medications before your next appointment, please call your pharmacy*   Lab Work: None ordered  If you have labs (blood work) drawn today and your tests are completely normal, you will receive your results only by: Marland Kitchen MyChart Message (if you have MyChart) OR . A paper copy in the mail If you have any lab test that is abnormal or we need to change your treatment, we will call you to review the results.   Testing/Procedures: None ordered   Follow-Up: At [facility], you and your  health needs are our priority.  As part of our continuing mission to provide you with exceptional heart care, we have created designated Provider Care Teams.  These Care Teams include your primary Cardiologist (physician) and Advanced Practice Providers (APPs -  Physician Assistants and Nurse Practitioners) who all work together to provide you with the care you need, when you need it.  We recommend signing up for the patient portal called "MyChart".  Sign up information is provided on this After Visit Summary.  MyChart is used to connect with patients for Virtual Visits (Telemedicine).  Patients are able to view lab/test results, encounter notes, upcoming appointments, etc.  Non-urgent messages can be sent to your provider as well.   To learn more about what you can do with MyChart, go to [email or web address].    Your next appointment:   AS NEEDED   The format for your next appointment:     Provider:      Other Instructions      Signed, Leanor Kail, [place]  09/06/2020 4:01 PM    [facility]

## 2020-09-06 NOTE — Patient Instructions (Signed)
Medication Instructions:  °Your physician recommends that you continue on your current medications as directed. Please refer to the Current Medication list given to you today. ° °*If you need a refill on your cardiac medications before your next appointment, please call your pharmacy* ° ° °Lab Work: °None ordered ° °If you have labs (blood work) drawn today and your tests are completely normal, you will receive your results only by: °MyChart Message (if you have MyChart) OR °A paper copy in the mail °If you have any lab test that is abnormal or we need to change your treatment, we will call you to review the results. ° ° °Testing/Procedures: °None ordered ° ° °Follow-Up: °At [facility], you and your health needs are our priority.  As part of our continuing mission to provide you with exceptional heart care, we have created designated Provider Care Teams.  These Care Teams include your primary Cardiologist (physician) and Advanced Practice Providers (APPs -  Physician Assistants and Nurse Practitioners) who all work together to provide you with the care you need, when you need it. ° °We recommend signing up for the patient portal called "MyChart".  Sign up information is provided on this After Visit Summary.  MyChart is used to connect with patients for Virtual Visits (Telemedicine).  Patients are able to view lab/test results, encounter notes, upcoming appointments, etc.  Non-urgent messages can be sent to your provider as well.   °To learn more about what you can do with MyChart, go to [email or web address].   ° °Your next appointment:   °AS NEEDED ° °The format for your next appointment:   ° ° °Provider:   °  ° ° °Other Instructions °  °

## 2020-10-18 ENCOUNTER — Emergency Department (HOSPITAL_COMMUNITY): Payer: Medicare Other

## 2020-10-18 ENCOUNTER — Other Ambulatory Visit: Payer: Self-pay

## 2020-10-18 ENCOUNTER — Observation Stay (HOSPITAL_COMMUNITY)
Admission: EM | Admit: 2020-10-18 | Discharge: 2020-10-20 | Disposition: A | Discharge status: Home / Self Care | Payer: Medicare Other | Attending: Internal Medicine | Admitting: Internal Medicine

## 2020-10-18 ENCOUNTER — Encounter (HOSPITAL_COMMUNITY): Payer: Self-pay

## 2020-10-18 DIAGNOSIS — R197 Diarrhea, unspecified: Secondary | ICD-10-CM | POA: Diagnosis present

## 2020-10-18 DIAGNOSIS — I480 Paroxysmal atrial fibrillation: Secondary | ICD-10-CM | POA: Insufficient documentation

## 2020-10-18 DIAGNOSIS — Z20822 Contact with and (suspected) exposure to covid-19: Secondary | ICD-10-CM | POA: Insufficient documentation

## 2020-10-18 DIAGNOSIS — Z79899 Other long term (current) drug therapy: Secondary | ICD-10-CM | POA: Diagnosis not present

## 2020-10-18 DIAGNOSIS — R911 Solitary pulmonary nodule: Secondary | ICD-10-CM

## 2020-10-18 DIAGNOSIS — F0390 Unspecified dementia without behavioral disturbance: Secondary | ICD-10-CM | POA: Insufficient documentation

## 2020-10-18 DIAGNOSIS — K6289 Other specified diseases of anus and rectum: Secondary | ICD-10-CM

## 2020-10-18 DIAGNOSIS — N179 Acute kidney failure, unspecified: Secondary | ICD-10-CM

## 2020-10-18 DIAGNOSIS — I5032 Chronic diastolic (congestive) heart failure: Secondary | ICD-10-CM | POA: Diagnosis not present

## 2020-10-18 DIAGNOSIS — Z87891 Personal history of nicotine dependence: Secondary | ICD-10-CM | POA: Insufficient documentation

## 2020-10-18 DIAGNOSIS — N182 Chronic kidney disease, stage 2 (mild): Secondary | ICD-10-CM | POA: Diagnosis not present

## 2020-10-18 DIAGNOSIS — I130 Hypertensive heart and chronic kidney disease with heart failure and stage 1 through stage 4 chronic kidney disease, or unspecified chronic kidney disease: Secondary | ICD-10-CM | POA: Insufficient documentation

## 2020-10-18 DIAGNOSIS — D72829 Elevated white blood cell count, unspecified: Secondary | ICD-10-CM | POA: Diagnosis not present

## 2020-10-18 DIAGNOSIS — K581 Irritable bowel syndrome with constipation: Secondary | ICD-10-CM | POA: Diagnosis not present

## 2020-10-18 DIAGNOSIS — F552 Abuse of laxatives: Secondary | ICD-10-CM | POA: Diagnosis not present

## 2020-10-18 DIAGNOSIS — E860 Dehydration: Secondary | ICD-10-CM | POA: Diagnosis not present

## 2020-10-18 LAB — RESP PANEL BY RT-PCR (FLU A&B, COVID) ARPGX2
Influenza A by PCR: NEGATIVE
Influenza B by PCR: NEGATIVE
SARS Coronavirus 2 by RT PCR: NEGATIVE

## 2020-10-18 LAB — POC OCCULT BLOOD, ED
Fecal Occult Bld: POSITIVE — AB
Fecal Occult Bld: POSITIVE — AB

## 2020-10-18 LAB — URINALYSIS, ROUTINE W REFLEX MICROSCOPIC
Bilirubin Urine: NEGATIVE
Glucose, UA: NEGATIVE mg/dL
Hgb urine dipstick: NEGATIVE
Ketones, ur: 5 mg/dL — AB
Leukocytes,Ua: NEGATIVE
Nitrite: NEGATIVE
Protein, ur: NEGATIVE mg/dL
Specific Gravity, Urine: 1.023 (ref 1.005–1.030)
pH: 5 (ref 5.0–8.0)

## 2020-10-18 LAB — CBC
HCT: 42.2 % (ref 36.0–46.0)
Hemoglobin: 13.2 g/dL (ref 12.0–15.0)
MCH: 28.2 pg (ref 26.0–34.0)
MCHC: 31.3 g/dL (ref 30.0–36.0)
MCV: 90.2 fL (ref 80.0–100.0)
Platelets: 190 10*3/uL (ref 150–400)
RBC: 4.68 MIL/uL (ref 3.87–5.11)
RDW: 13.4 % (ref 11.5–15.5)
WBC: 34.4 10*3/uL — ABNORMAL HIGH (ref 4.0–10.5)
nRBC: 0 % (ref 0.0–0.2)

## 2020-10-18 LAB — COMPREHENSIVE METABOLIC PANEL
ALT: 18 U/L (ref 0–44)
AST: 28 U/L (ref 15–41)
Albumin: 3.8 g/dL (ref 3.5–5.0)
Alkaline Phosphatase: 83 U/L (ref 38–126)
Anion gap: 15 (ref 5–15)
BUN: 31 mg/dL — ABNORMAL HIGH (ref 8–23)
CO2: 21 mmol/L — ABNORMAL LOW (ref 22–32)
Calcium: 9.8 mg/dL (ref 8.9–10.3)
Chloride: 102 mmol/L (ref 98–111)
Creatinine, Ser: 1.86 mg/dL — ABNORMAL HIGH (ref 0.44–1.00)
GFR, Estimated: 28 mL/min — ABNORMAL LOW (ref >60)
Glucose, Bld: 169 mg/dL — ABNORMAL HIGH (ref 70–99)
Potassium: 3.7 mmol/L (ref 3.5–5.1)
Sodium: 138 mmol/L (ref 135–145)
Total Bilirubin: 0.6 mg/dL (ref 0.3–1.2)
Total Protein: 6.6 g/dL (ref 6.5–8.1)

## 2020-10-18 LAB — C DIFFICILE QUICK SCREEN W PCR REFLEX
C Diff antigen: NEGATIVE
C Diff interpretation: NOT DETECTED
C Diff toxin: NEGATIVE

## 2020-10-18 LAB — APTT: aPTT: 27 seconds (ref 24–36)

## 2020-10-18 LAB — PROTIME-INR
INR: 1.1 (ref 0.8–1.2)
Prothrombin Time: 13.9 seconds (ref 11.4–15.2)

## 2020-10-18 LAB — LIPASE, BLOOD: Lipase: 44 U/L (ref 11–51)

## 2020-10-18 LAB — LACTIC ACID, PLASMA: Lactic Acid, Venous: 1.6 mmol/L (ref 0.5–1.9)

## 2020-10-18 MED ORDER — THIAMINE HCL 100 MG PO TABS
250.0000 mg | ORAL_TABLET | Freq: Every day | ORAL | Status: DC
  Administered 2020-10-19 – 2020-10-20 (×2): 250 mg via ORAL
  Filled 2020-10-18 (×2): qty 3

## 2020-10-18 MED ORDER — GABAPENTIN 100 MG PO CAPS
200.0000 mg | ORAL_CAPSULE | Freq: Two times a day (BID) | ORAL | Status: DC
  Administered 2020-10-18 – 2020-10-20 (×4): 200 mg via ORAL
  Filled 2020-10-18 (×4): qty 2

## 2020-10-18 MED ORDER — LORATADINE 10 MG PO TABS
10.0000 mg | ORAL_TABLET | Freq: Every day | ORAL | Status: DC
  Administered 2020-10-19 – 2020-10-20 (×2): 10 mg via ORAL
  Filled 2020-10-18 (×3): qty 1

## 2020-10-18 MED ORDER — HEPARIN SODIUM (PORCINE) 5000 UNIT/ML IJ SOLN
5000.0000 [IU] | Freq: Two times a day (BID) | INTRAMUSCULAR | Status: DC
  Administered 2020-10-18 – 2020-10-20 (×3): 5000 [IU] via SUBCUTANEOUS
  Filled 2020-10-18 (×3): qty 1

## 2020-10-18 MED ORDER — POLYVINYL ALCOHOL 1.4 % OP SOLN
1.0000 [drp] | Freq: Two times a day (BID) | OPHTHALMIC | Status: DC | PRN
  Filled 2020-10-18: qty 15

## 2020-10-18 MED ORDER — MENTHOL (TOPICAL ANALGESIC) 4 % EX GEL: 1.0000 "application " | CUTANEOUS | Status: DC

## 2020-10-18 MED ORDER — DONEPEZIL HCL 10 MG PO TABS
10.0000 mg | ORAL_TABLET | Freq: Every day | ORAL | Status: DC
  Administered 2020-10-18 – 2020-10-19 (×2): 10 mg via ORAL
  Filled 2020-10-18 (×3): qty 1

## 2020-10-18 MED ORDER — MUSCLE RUB 10-15 % EX CREA
TOPICAL_CREAM | CUTANEOUS | Status: DC | PRN
  Filled 2020-10-18: qty 85

## 2020-10-18 MED ORDER — ROSUVASTATIN CALCIUM 20 MG PO TABS
40.0000 mg | ORAL_TABLET | Freq: Every day | ORAL | Status: DC
  Administered 2020-10-19 – 2020-10-20 (×2): 40 mg via ORAL
  Filled 2020-10-18 (×2): qty 2

## 2020-10-18 MED ORDER — MAGNESIUM CITRATE PO SOLN: 150.0000 mL | Freq: Every day | ORAL | Status: DC | PRN

## 2020-10-18 MED ORDER — VITAMIN D 25 MCG (1000 UNIT) PO TABS
1000.0000 [IU] | ORAL_TABLET | Freq: Every day | ORAL | Status: DC
  Administered 2020-10-19 – 2020-10-20 (×2): 1000 [IU] via ORAL
  Filled 2020-10-18 (×4): qty 1

## 2020-10-18 MED ORDER — LEVOCETIRIZINE DIHYDROCHLORIDE 5 MG PO TABS: 5.0000 mg | ORAL_TABLET | Freq: Every evening | ORAL | Status: DC

## 2020-10-18 MED ORDER — ACETAMINOPHEN 325 MG PO TABS
650.0000 mg | ORAL_TABLET | Freq: Four times a day (QID) | ORAL | Status: DC | PRN
  Administered 2020-10-19: 650 mg via ORAL
  Filled 2020-10-18: qty 2

## 2020-10-18 MED ORDER — SODIUM CHLORIDE 0.9 % IV SOLN: INTRAVENOUS | Status: AC

## 2020-10-18 MED ORDER — ZOLPIDEM TARTRATE 5 MG PO TABS
5.0000 mg | ORAL_TABLET | Freq: Every day | ORAL | Status: DC
  Administered 2020-10-18 – 2020-10-19 (×2): 5 mg via ORAL
  Filled 2020-10-18 (×2): qty 1

## 2020-10-18 MED ORDER — ONDANSETRON HCL 4 MG/2ML IJ SOLN
4.0000 mg | Freq: Four times a day (QID) | INTRAMUSCULAR | Status: DC | PRN
Start: 2020-10-18 — End: 2020-10-20

## 2020-10-18 MED ORDER — MIRABEGRON ER 25 MG PO TB24: 25.0000 mg | ORAL_TABLET | Freq: Every day | ORAL | Status: DC

## 2020-10-18 MED ORDER — DEXTRAN 70-HYPROMELLOSE 0.1-0.3 % OP SOLN: 1.0000 [drp] | Freq: Two times a day (BID) | OPHTHALMIC | Status: DC | PRN

## 2020-10-18 MED ORDER — ARIPIPRAZOLE 2 MG PO TABS
2.0000 mg | ORAL_TABLET | Freq: Every morning | ORAL | Status: DC
  Administered 2020-10-19 – 2020-10-20 (×2): 2 mg via ORAL
  Filled 2020-10-18 (×3): qty 1

## 2020-10-18 MED ORDER — ONDANSETRON HCL 4 MG PO TABS: 4.0000 mg | ORAL_TABLET | Freq: Four times a day (QID) | ORAL | Status: DC | PRN

## 2020-10-18 MED ORDER — VITAMIN B-1 100 MG PO TABS: 250.0000 mg | ORAL_TABLET | Freq: Every day | ORAL | Status: DC

## 2020-10-18 MED ORDER — SERTRALINE HCL 100 MG PO TABS
200.0000 mg | ORAL_TABLET | Freq: Every day | ORAL | Status: DC
  Administered 2020-10-19 – 2020-10-20 (×2): 200 mg via ORAL
  Filled 2020-10-18 (×2): qty 2

## 2020-10-18 MED ORDER — DIPHENHYDRAMINE HCL 25 MG PO CAPS: 25.0000 mg | ORAL_CAPSULE | Freq: Every day | ORAL | Status: DC | PRN

## 2020-10-18 MED ORDER — ARIPIPRAZOLE 5 MG PO TABS: 5.0000 mg | ORAL_TABLET | Freq: Every day | ORAL | Status: DC

## 2020-10-18 MED ORDER — SODIUM CHLORIDE 0.9 % IV BOLUS
1000.0000 mL | Freq: Once | INTRAVENOUS | Status: AC
  Administered 2020-10-18: 1000 mL via INTRAVENOUS

## 2020-10-18 MED ORDER — ACETAMINOPHEN 325 MG PO TABS: 650.0000 mg | ORAL_TABLET | Freq: Four times a day (QID) | ORAL | Status: DC | PRN

## 2020-10-18 MED ORDER — CLONAZEPAM 0.5 MG PO TABS: 0.5000 mg | ORAL_TABLET | Freq: Two times a day (BID) | ORAL | Status: DC | PRN

## 2020-10-18 MED ORDER — ACETAMINOPHEN 650 MG RE SUPP: 650.0000 mg | Freq: Four times a day (QID) | RECTAL | Status: DC | PRN

## 2020-10-18 MED ORDER — SODIUM CHLORIDE 0.9 % IV BOLUS
500.0000 mL | Freq: Once | INTRAVENOUS | Status: DC
  Administered 2020-10-18: 500 mL via INTRAVENOUS

## 2020-10-18 MED ORDER — PANTOPRAZOLE SODIUM 40 MG PO TBEC
40.0000 mg | DELAYED_RELEASE_TABLET | Freq: Two times a day (BID) | ORAL | Status: DC
  Administered 2020-10-18 – 2020-10-20 (×4): 40 mg via ORAL
  Filled 2020-10-18 (×4): qty 1

## 2020-10-18 NOTE — ED Triage Notes (Signed)
Pt bib [facility] from [facility] with complaints of diarrhea after taking 3 laxatives last night. No complaints of pain.
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
## 2020-10-18 NOTE — ED Notes (Signed)
Pt was able to ambulate to the restroom.  
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
## 2020-10-19 DIAGNOSIS — K6289 Other specified diseases of anus and rectum: Secondary | ICD-10-CM

## 2020-10-19 DIAGNOSIS — R197 Diarrhea, unspecified: Secondary | ICD-10-CM | POA: Diagnosis not present

## 2020-10-19 DIAGNOSIS — R911 Solitary pulmonary nodule: Secondary | ICD-10-CM | POA: Diagnosis not present

## 2020-10-19 DIAGNOSIS — N179 Acute kidney failure, unspecified: Secondary | ICD-10-CM

## 2020-10-19 LAB — CBC
HCT: 36.5 % (ref 36.0–46.0)
Hemoglobin: 11.4 g/dL — ABNORMAL LOW (ref 12.0–15.0)
MCH: 27.7 pg (ref 26.0–34.0)
MCHC: 31.2 g/dL (ref 30.0–36.0)
MCV: 88.8 fL (ref 80.0–100.0)
Platelets: 142 10*3/uL — ABNORMAL LOW (ref 150–400)
RBC: 4.11 MIL/uL (ref 3.87–5.11)
RDW: 13.3 % (ref 11.5–15.5)
WBC: 22 10*3/uL — ABNORMAL HIGH (ref 4.0–10.5)
nRBC: 0 % (ref 0.0–0.2)

## 2020-10-19 LAB — BASIC METABOLIC PANEL
Anion gap: 12 (ref 5–15)
BUN: 27 mg/dL — ABNORMAL HIGH (ref 8–23)
CO2: 20 mmol/L — ABNORMAL LOW (ref 22–32)
Calcium: 8.6 mg/dL — ABNORMAL LOW (ref 8.9–10.3)
Chloride: 106 mmol/L (ref 98–111)
Creatinine, Ser: 0.99 mg/dL (ref 0.44–1.00)
GFR, Estimated: 59 mL/min — ABNORMAL LOW (ref >60)
Glucose, Bld: 114 mg/dL — ABNORMAL HIGH (ref 70–99)
Potassium: 3.4 mmol/L — ABNORMAL LOW (ref 3.5–5.1)
Sodium: 138 mmol/L (ref 135–145)

## 2020-10-19 LAB — URINE CULTURE: Culture: NO GROWTH

## 2020-10-19 MED ORDER — POTASSIUM CHLORIDE CRYS ER 20 MEQ PO TBCR
40.0000 meq | EXTENDED_RELEASE_TABLET | Freq: Once | ORAL | Status: AC
  Administered 2020-10-19: 40 meq via ORAL
  Filled 2020-10-19: qty 2

## 2020-10-19 NOTE — Progress Notes (Signed)
CSW notes patient is from [facility]. Will follow for discharge needs.   Gilmore Laroche, MSW, [facility]

## 2020-10-19 NOTE — Progress Notes (Signed)
Patient admitted for diarrhea due to laxative use.Marland Kitchen arrived into the unit at about 2030. Patient is alert, oriented with some forgetfulness, skin assessment done with a another RN, respiration even and unlabored. Patient was oriented to room and equipment, will continue to monitor

## 2020-10-19 NOTE — Progress Notes (Addendum)
PROGRESS NOTE        PATIENT DETAILS Name: Angelica Kelley Age: 75 y.o. Sex: female Date of Birth: Mar 09, 1946 Admit Date: 10/18/2020 Admitting Physician Lequita Halt, MD STM:[id number], [facility]  Brief Narrative: Patient is a 75 y.o. female with history of constipation predominant IBS, anxiety/depression, chronic diastolic heart failure PAF-not on anticoagulation due to frequent falls/dementia-presented to the ED from her ALF for evaluation of diarrhea following a bout of constipation for which she took laxatives.  While in the ED-patient had transient hypotension and was found to have colitis on CT abdomen/pelvis along with a leukocytosis of 34.4.  Patient was then admitted to the hospitalist service.  See below for further details.  Significant events: 6/7>> admit for evaluation of diarrhea-AKI-colitis on CT-WBC of 34.4K  Significant studies: 6/7>> CT abdomen/pelvis: Colitis involving rectosigmoid 6/7>> CXR: No pneumonia  Antimicrobial therapy: None  Microbiology data: 6/7>> COVID/influenza PCR: Negative 6/7>> blood culture: Negative 6/7>> urine culture: Negative 6/7>> stool C. difficile: Negative 6/8>> GI pathogen panel: Pending  Procedures : None  Consults: None  DVT Prophylaxis : heparin injection 5,000 Units Start: 10/18/20 2200   Subjective: Diarrhea slowly improving-had a few episodes of loose watery stools this morning.  Denies any abdominal pain   Assessment/Plan: Diarrhea: Occurred after laxative treatment for constipation-however has evidence of colitis on CT scan (some chronic rectosigmoid thickening-but worse) and leukocytosis-therefore concern that this could be infectious etiology.  C. difficile studies negative-awaiting GI pathogen panel.  Since diarrhea and leukocytosis improving-continue to monitor off antimicrobial therapy.  Await blood cultures as well.   AKI: Hemodynamically mediated-improving with supportive care.   Avoid nephrotoxic agent  PAF: Maintaining sinus rhythm-reviewed outpatient cardiology note-not a candidate for anticoagulation given history of fall/subdural hematoma and GI bleeding  Constipation predominant IBS: Currently with diarrhea  Chronic rectosigmoid thickening: Colonoscopy into the 2021 was unremarkable.  Stable for outpatient follow-up by GI.  Anxiety/depression: Appears stable-continue Abilify/Zoloft and as needed Klonopin  Dementia: Appears mild-following most of my commands-answering all my questions appropriately-on Aricept  1.3 cm posterior right lower lobe pulmonary nodule: Repeat CT chest in 3 to 6 months-as recommended by radiology.   Diet: Diet Order            Diet Heart Room service appropriate? Yes; Fluid consistency: Thin  Diet effective now                  Code Status: Full code  Family Communication: None at bedside-we will reach out to family over the next few days   Disposition Plan: Status is: Observation  The patient will require care spanning > 2 midnights and should be moved to inpatient because: Inpatient level of care appropriate due to severity of illness  Dispo: The patient is from: ALF              Anticipated d/c is to: ALF              Patient currently is not medically stable to d/c.   Difficult to place patient No   Barriers to Discharge: Resolving diarrhea-still with significant leukocytosis-awaiting final culture data  Antimicrobial agents: Anti-infectives (From admission, onward)   None       Time spent: 35 minutes-Greater than 50% of this time was spent in counseling, explanation of diagnosis, planning of further management, and coordination of care.  MEDICATIONS:  Scheduled Meds: . ARIPiprazole  2 mg Oral q AM  . cholecalciferol  1,000 Units Oral Daily  . donepezil  10 mg Oral QHS  . gabapentin  200 mg Oral BID  . heparin  5,000 Units Subcutaneous Q12H  . loratadine  10 mg Oral Daily  . pantoprazole  40 mg  Oral BID  . rosuvastatin  40 mg Oral Daily  . sertraline  200 mg Oral Daily  . thiamine  250 mg Oral Daily  . zolpidem  5 mg Oral QHS   Continuous Infusions: . sodium chloride 100 mL/hr at 10/19/20 0605   PRN Meds:.acetaminophen **OR** acetaminophen, clonazePAM, diphenhydrAMINE, magnesium citrate, Muscle Rub, ondansetron **OR** ondansetron (ZOFRAN) IV, polyvinyl alcohol   PHYSICAL EXAM: Vital signs: Vitals:   10/18/20 2337 10/19/20 0408 10/19/20 0806 10/19/20 1200  BP: (!) 100/49 (!) 107/49 (!) 114/53   Pulse: 74 70    Resp: 20 18  15   Temp: 98.4 F (36.9 C) 98 F (36.7 C) 98.3 F (36.8 C) 98.9 F (37.2 C)  TempSrc: Axillary Axillary Oral Oral  SpO2: 92% 93%    Weight:      Height:       Filed Weights   10/18/20 1134 10/18/20 2045  Weight: 63.5 kg 67.9 kg   Body mass index is 23.31 kg/m.   Gen Exam:Alert awake-not in any distress HEENT:atraumatic, normocephalic Chest: B/L clear to auscultation anteriorly CVS:S1S2 regular Abdomen:soft non tender, non distended Extremities:no edema Neurology: Non focal Skin: no rash  I have personally reviewed following labs and imaging studies  LABORATORY DATA: CBC: Recent Labs  Lab 10/18/20 1142 10/19/20 0037  WBC 34.4* 22.0*  HGB 13.2 11.4*  HCT 42.2 36.5  MCV 90.2 88.8  PLT 190 142*    Basic Metabolic Panel: Recent Labs  Lab 10/18/20 1142 10/19/20 0037  NA 138 138  K 3.7 3.4*  CL 102 106  CO2 21* 20*  GLUCOSE 169* 114*  BUN 31* 27*  CREATININE 1.86* 0.99  CALCIUM 9.8 8.6*    GFR: Estimated Creatinine Clearance: 46.7 mL/min (by C-G formula based on SCr of 0.99 mg/dL).  Liver Function Tests: Recent Labs  Lab 10/18/20 1142  AST 28  ALT 18  ALKPHOS 83  BILITOT 0.6  PROT 6.6  ALBUMIN 3.8   Recent Labs  Lab 10/18/20 1142  LIPASE 44   No results for input(s): AMMONIA in the last 168 hours.  Coagulation Profile: Recent Labs  Lab 10/18/20 1300  INR 1.1    Cardiac Enzymes: No results for  input(s): CKTOTAL, CKMB, CKMBINDEX, TROPONINI in the last 168 hours.  BNP (last 3 results) No results for input(s): PROBNP in the last 8760 hours.  Lipid Profile: No results for input(s): CHOL, HDL, LDLCALC, TRIG, CHOLHDL, LDLDIRECT in the last 72 hours.  Thyroid Function Tests: No results for input(s): TSH, T4TOTAL, FREET4, T3FREE, THYROIDAB in the last 72 hours.  Anemia Panel: No results for input(s): VITAMINB12, FOLATE, FERRITIN, TIBC, IRON, RETICCTPCT in the last 72 hours.  Urine analysis:    Component Value Date/Time   COLORURINE AMBER (A) 10/18/2020 1458   APPEARANCEUR HAZY (A) 10/18/2020 1458   LABSPEC 1.023 10/18/2020 1458   PHURINE 5.0 10/18/2020 1458   GLUCOSEU NEGATIVE 10/18/2020 1458   HGBUR NEGATIVE 10/18/2020 1458   BILIRUBINUR NEGATIVE 10/18/2020 1458   KETONESUR 5 (A) 10/18/2020 1458   PROTEINUR NEGATIVE 10/18/2020 1458   UROBILINOGEN 1 06/09/2014 1531   NITRITE NEGATIVE 10/18/2020 1458   LEUKOCYTESUR NEGATIVE 10/18/2020 1458  Sepsis Labs: Lactic Acid, Venous    Component Value Date/Time   LATICACIDVEN 1.6 10/18/2020 1300    MICROBIOLOGY: Recent Results (from the past 240 hour(s))  Resp Panel by RT-PCR (Flu A&B, Covid) Nasopharyngeal Swab     Status: None   Collection Time: 10/18/20 12:57 PM   Specimen: Nasopharyngeal Swab; Nasopharyngeal(NP) swabs in vial transport medium  Result Value Ref Range Status   SARS Coronavirus 2 by RT PCR NEGATIVE NEGATIVE Final    Comment: (NOTE) SARS-CoV-2 target nucleic acids are NOT DETECTED.  The SARS-CoV-2 RNA is generally detectable in upper respiratory specimens during the acute phase of infection. The lowest concentration of SARS-CoV-2 viral copies this assay can detect is 138 copies/mL. A negative result does not preclude SARS-Cov-2 infection and should not be used as the sole basis for treatment or other patient management decisions. A negative result may occur with  improper specimen collection/handling,  submission of specimen other than nasopharyngeal swab, presence of viral mutation(s) within the areas targeted by this assay, and inadequate number of viral copies(<138 copies/mL). A negative result must be combined with clinical observations, patient history, and epidemiological information. The expected result is Negative.  Fact Sheet for Patients:  EntrepreneurPulse.com.au  Fact Sheet for Healthcare Providers:  IncredibleEmployment.be  This test is no t yet approved or cleared by the [place] FDA and  has been authorized for detection and/or diagnosis of SARS-CoV-2 by FDA under an Emergency Use Authorization (EUA). This EUA will remain  in effect (meaning this test can be used) for the duration of the COVID-19 declaration under Section 564(b)(1) of the Act, 21 U.S.C.section 360bbb-3(b)(1), unless the authorization is terminated  or revoked sooner.       Influenza A by PCR NEGATIVE NEGATIVE Final   Influenza B by PCR NEGATIVE NEGATIVE Final    Comment: (NOTE) The Xpert Xpress SARS-CoV-2/FLU/RSV plus assay is intended as an aid in the diagnosis of influenza from Nasopharyngeal swab specimens and should not be used as a sole basis for treatment. Nasal washings and aspirates are unacceptable for Xpert Xpress SARS-CoV-2/FLU/RSV testing.  Fact Sheet for Patients: EntrepreneurPulse.com.au  Fact Sheet for Healthcare Providers: IncredibleEmployment.be  This test is not yet approved or cleared by the [place] FDA and has been authorized for detection and/or diagnosis of SARS-CoV-2 by FDA under an Emergency Use Authorization (EUA). This EUA will remain in effect (meaning this test can be used) for the duration of the COVID-19 declaration under Section 564(b)(1) of the Act, 21 U.S.C. section 360bbb-3(b)(1), unless the authorization is terminated or revoked.  Performed at [facility], [place] [address], [place], [place] [address]   Blood culture (routine single)     Status: None (Preliminary result)   Collection Time: 10/18/20  1:00 PM   Specimen: BLOOD  Result Value Ref Range Status   Specimen Description BLOOD RIGHT ANTECUBITAL  Final   Special Requests   Final    BOTTLES DRAWN AEROBIC AND ANAEROBIC Blood Culture adequate volume   Culture   Final    NO GROWTH < 24 HOURS Performed at [facility], [place] [address], [place], [place] [address]    Report Status PENDING  Incomplete  Urine culture     Status: None   Collection Time: 10/18/20  2:39 PM   Specimen: In/Out Cath Urine  Result Value Ref Range Status   Specimen Description IN/OUT CATH URINE  Final   Special Requests NONE  Final   Culture   Final  NO GROWTH Performed at [facility], [place] [address], [place], [place] [address]    Report Status 10/19/2020 FINAL  Final  C Difficile Quick Screen w PCR reflex     Status: None   Collection Time: 10/18/20  3:39 PM   Specimen: STOOL  Result Value Ref Range Status   C Diff antigen NEGATIVE NEGATIVE Final   C Diff toxin NEGATIVE NEGATIVE Final   C Diff interpretation No C. difficile detected.  Final    Comment: Performed at [facility], [place] [address], [place], [place] [address]    RADIOLOGY STUDIES/RESULTS: CT ABDOMEN PELVIS WO CONTRAST  Result Date: 10/18/2020 CLINICAL DATA:  Leukocytosis (WBC 34), diarrhea, dementia. Possible colitis. EXAM: CT ABDOMEN AND PELVIS WITHOUT CONTRAST TECHNIQUE: Multidetector CT imaging of the abdomen and pelvis was performed following the standard protocol without IV contrast. COMPARISON:  01/11/2019 CT abdomen/pelvis. FINDINGS: Lower chest: New indistinct subsolid 1.3 cm posterior right lower lobe pulmonary nodule (series 7/image 45). Patchy reticulation and ground-glass opacity throughout the periphery of both lung bases, improved from prior. Coronary atherosclerosis. Hepatobiliary: Normal liver size. No liver mass. Normal  gallbladder with no radiopaque cholelithiasis. No biliary ductal dilatation. Pancreas: Embolization coils in the region of the pancreatic head. No pancreatic mass or duct dilation. Spleen: Normal size. No mass. Adrenals/Urinary Tract: Normal adrenals. Hyperdense 1.0 cm posterior interpolar left renal cortical lesion (series 3/image 43), new. No additional contour deforming renal lesions. No renal stones. No hydronephrosis. Normal bladder. Stomach/Bowel: Normal non-distended stomach. Normal caliber small bowel with no small bowel wall thickening. Status post appendectomy with normal diminutive appendiceal stump. Circumferential wall thickening throughout the rectosigmoid junction and rectum, which appears chronic but worsened from prior CT, with slight haziness of the perirectal fat. Moderate gas and scattered fluid levels in the transverse and left colon. No diverticulosis. Vascular/Lymphatic: Atherosclerotic nonaneurysmal abdominal aorta. No pathologically enlarged lymph nodes in the abdomen or pelvis. Reproductive: Status post hysterectomy, with no abnormal findings at the vaginal cuff. No adnexal mass. Other: No pneumoperitoneum, ascites or focal fluid collection. Musculoskeletal: No aggressive appearing focal osseous lesions. Chronic mild L3, severe L4 and moderate L5 vertebral compression fractures with marked degenerative disc disease throughout the lower lumbar spine. IMPRESSION: 1. Circumferential wall thickening throughout the rectosigmoid junction and rectum, which appears chronic but worsened from prior 2019 CT, with slight haziness of the perirectal fat. Findings suggest a nonspecific infectious or inflammatory proctitis, with the differential including C diff. 2. Moderate gas and scattered fluid levels in the transverse and left colon, indicative of a nonspecific malaborptive state. No bowel obstruction. 3. New indistinct subsolid 1.3 cm posterior right lower lobe pulmonary nodule. Follow-up  non-contrast chest CT recommended at 3-6 months to confirm persistence. If unchanged, and solid component remains <6 mm, annual CT is recommended until 5 years of stability has been established. If persistent these nodules should be considered highly suspicious if the solid component of the nodule is 6 mm or greater in size and enlarging. This recommendation follows the consensus statement: Guidelines for Management of Incidental Pulmonary Nodules Detected on CT Images: From the Fleischner Society 2017; Radiology 2017; 284:228-243. 4. Nonspecific patchy pulmonary fibrosis at the peripheral lung bases, improved from prior, suggesting evolving postinflammatory fibrosis. 5. Aortic Atherosclerosis (ICD10-I70.0). Electronically Signed   By: Ilona Sorrel M.D.   On: 10/18/2020 14:56   DG Chest Port 1 View  Result Date: 10/18/2020 CLINICAL DATA:  Questionable sepsis. EXAM: PORTABLE CHEST 1 VIEW COMPARISON:  01/13/2019.  10/12/2017.  CT cervical spine 10/12/2017. FINDINGS: Cardiomegaly. Stable atherosclerotic vascular calcifications. No pulmonary venous congestion. Chronic interstitial changes are noted. No focal alveolar infiltrate. No pleural effusion or pneumothorax. Degenerative change thoracic spine. IMPRESSION: 1. Chronic interstitial changes. No focal alveolar infiltrate. Exam stable from prior exam. 2.  Cardiomegaly.  No pulmonary venous congestion. Electronically Signed   By: Marcello Moores  Register   On: 10/18/2020 13:46     LOS: 0 days   Oren Binet, MD  [facility]    To contact the attending provider between 7A-7P or the covering provider during after hours 7P-7A, please log into the web site [email or web address] and access using universal [place] password for that web site. If you do not have the password, please call the hospital operator.  10/19/2020, 3:35 PM

## 2020-10-19 NOTE — Evaluation (Signed)
Occupational Therapy Evaluation Patient Details Name: Angelica Kelley MRN: [id number] DOB: 01/17/1946 Today's Date: 10/19/2020    History of Present Illness Angelica Kelley is a 75 y.o. female with medical history significant of IBS with dominantly constipation, anxiety/depression, PAF not on anticoagulation due to frequent falls, dementia, chronic diastolic CHF, presented with severe diarrhea. Negative for Cdiff.   Clinical Impression   PTA, pt was living at [place], pt reports she was independent with ADL and the facility assisted with IADL. Pt reports she was independent with functional mobility. Pt with cognitive limitations at baseline. Pt appears to be functioning at baseline level. She completed toileting and grooming at sink level with supervision for management of lines. Patient evaluated by Occupational Therapy with no further acute OT needs identified. All education has been completed and the patient has no further questions. See below for any follow-up Occupational Therapy or equipment needs. OT to sign off. Thank you for referral.    Follow Up Recommendations  No OT follow up    Equipment Recommendations  None recommended by OT    Recommendations for Other Services       Precautions / Restrictions Precautions Precaution Comments: mod fall Restrictions Weight Bearing Restrictions: No      Mobility Bed Mobility Overal bed mobility: Independent                  Transfers Overall transfer level: Independent                    Balance Overall balance assessment: Mild deficits observed, not formally tested                                         ADL either performed or assessed with clinical judgement   ADL Overall ADL's : At baseline                                       General ADL Comments: pt requires supervision for managment of lines, pt completed toileting and grooming at sink level with supervision;no AD  utilized during session, no loss of balance noted during session     Vision Patient Visual Report: No change from baseline Vision Assessment?: No apparent visual deficits Additional Comments: pt reading from menu     Perception     Praxis      Pertinent Vitals/Pain Pain Assessment: No/denies pain     Hand Dominance Right   Extremity/Trunk Assessment Upper Extremity Assessment Upper Extremity Assessment: Overall WFL for tasks assessed   Lower Extremity Assessment Lower Extremity Assessment: Overall WFL for tasks assessed   Cervical / Trunk Assessment Cervical / Trunk Assessment: Normal   Communication Communication Communication: No difficulties   Cognition Arousal/Alertness: Awake/alert Behavior During Therapy: WFL for tasks assessed/performed Overall Cognitive Status: History of cognitive impairments - at baseline                                 General Comments: word finding difficulties noted, STM deficits. pt with hx of dementia   General Comments  vss    Exercises     Shoulder Instructions      Home Living Family/patient expects to be discharged to:: Assisted living  Additional Comments: From prior visit patient is at [facility]      Prior Functioning/Environment Level of Independence: Needs assistance    ADL's / Homemaking Assistance Needed: reports independent with ADL;facility assists with IADL (medication, cooking, etc)   Comments: Reports she does not use AD        OT Problem List: Decreased activity tolerance;Decreased cognition      OT Treatment/Interventions:      OT Goals(Current goals can be found in the care plan section) Acute Rehab OT Goals Patient Stated Goal: to return home OT Goal Formulation: With patient Time For Goal Achievement: 11/02/20 Potential to Achieve Goals: Good  OT Frequency:     Barriers to D/C:            Co-evaluation               AM-PAC OT "6 Clicks" Daily Activity     Outcome Measure Help from another person eating meals?: None Help from another person taking care of personal grooming?: None Help from another person toileting, which includes using toliet, bedpan, or urinal?: A Little Help from another person bathing (including washing, rinsing, drying)?: A Little Help from another person to put on and taking off regular upper body clothing?: A Little Help from another person to put on and taking off regular lower body clothing?: A Little 6 Click Score: 20   End of Session Nurse Communication: Mobility status  Activity Tolerance: Patient tolerated treatment well Patient left: in bed;with call bell/phone within reach;with bed alarm set  OT Visit Diagnosis: Other abnormalities of gait and mobility (R26.89)                Time: 1110-1130 OT Time Calculation (min): 20 min Charges:  OT General Charges $OT Visit: 1 Visit OT Evaluation $OT Eval Moderate Complexity: 1 Mod  Dashawn Golda OTR/L Acute Rehabilitation Services Office: [phone]   Wyn Forster 10/19/2020, 11:46 AM

## 2020-10-19 NOTE — Evaluation (Signed)
Physical Therapy Evaluation Patient Details Name: Angelica Kelley MRN: [id number] DOB: 03-28-1946 Today's Date: 10/19/2020   History of Present Illness  Angelica Kelley is a 75 y.o. female with medical history significant of IBS with dominantly constipation, anxiety/depression, PAF not on anticoagulation due to frequent falls, dementia, chronic diastolic CHF, presented with severe diarrhea. Negative for Cdiff.    Clinical Impression  Patient is pleasant, agreeable to PT session. Reports she has not had breakfast. When asked RN was told patient had said she did not want anything when asked on multiple occasions. Left patient with menu. Patient has STM deficits and word finding difficulties. She performed bed mobility and transfer with independence. Min guard for ambulation of 200 feet in hallway. No AD. Patient without difficulties noted or reported. She appears to be at baseline level of functioning and does not require continued PT intervention. Patient safe to return to prior living environment.        Follow Up Recommendations No PT follow up    Equipment Recommendations  None recommended by PT    Recommendations for Other Services       Precautions / Restrictions Precautions Precaution Comments: mod fall Restrictions Weight Bearing Restrictions: No      Mobility  Bed Mobility Overal bed mobility: Independent                  Transfers Overall transfer level: Independent                  Ambulation/Gait Ambulation/Gait assistance: Min guard Gait Distance (Feet): 200 Feet Assistive device: None Gait Pattern/deviations: Step-through pattern;Drifts right/left Gait velocity: WNL   General Gait Details: generally safe with mobility. Min guard for safety only.  Stairs            Wheelchair Mobility    Modified Rankin (Stroke Patients Only)       Balance Overall balance assessment: Mild deficits observed, not formally tested                                            Pertinent Vitals/Pain Pain Assessment: No/denies pain    Home Living Family/patient expects to be discharged to:: Assisted living                 Additional Comments: From prior visit patient is at [facility]    Prior Function           Comments: Reports she does not use AD     Hand Dominance   Dominant Hand: Right    Extremity/Trunk Assessment   Upper Extremity Assessment Upper Extremity Assessment: Overall WFL for tasks assessed    Lower Extremity Assessment Lower Extremity Assessment: Overall WFL for tasks assessed    Cervical / Trunk Assessment Cervical / Trunk Assessment: Normal  Communication   Communication: No difficulties  Cognition Arousal/Alertness: Awake/alert Behavior During Therapy: WFL for tasks assessed/performed Overall Cognitive Status: History of cognitive impairments - at baseline                                 General Comments: word finding difficulties noted, STM deficits.      General Comments      Exercises     Assessment/Plan    PT Assessment Patent does not need any further PT services  PT  Problem List         PT Treatment Interventions      PT Goals (Current goals can be found in the Care Plan section)  Acute Rehab PT Goals Patient Stated Goal: to return home PT Goal Formulation: With patient Time For Goal Achievement: 10/21/20 Potential to Achieve Goals: Good    Frequency     Barriers to discharge        Co-evaluation               AM-PAC PT "6 Clicks" Mobility  Outcome Measure Help needed turning from your back to your side while in a flat bed without using bedrails?: None Help needed moving from lying on your back to sitting on the side of a flat bed without using bedrails?: None Help needed moving to and from a bed to a chair (including a wheelchair)?: None Help needed standing up from a chair using your arms (e.g., wheelchair or bedside  chair)?: None Help needed to walk in hospital room?: None Help needed climbing 3-5 steps with a railing? : None 6 Click Score: 24    End of Session Equipment Utilized During Treatment: Gait belt Activity Tolerance: Patient tolerated treatment well Patient left: in bed;with bed alarm set;with call bell/phone within reach Nurse Communication: Mobility status      Time: 1045-1105 PT Time Calculation (min) (ACUTE ONLY): 20 min   Charges:   PT Evaluation $PT Eval Low Complexity: 1 Low PT Treatments $Gait Training: 8-22 mins        Jadah Bobak, PT, GCS 10/19/20,11:20 AM

## 2020-10-20 DIAGNOSIS — F552 Abuse of laxatives: Secondary | ICD-10-CM | POA: Diagnosis not present

## 2020-10-20 DIAGNOSIS — N179 Acute kidney failure, unspecified: Secondary | ICD-10-CM | POA: Diagnosis not present

## 2020-10-20 DIAGNOSIS — R911 Solitary pulmonary nodule: Secondary | ICD-10-CM | POA: Diagnosis not present

## 2020-10-20 DIAGNOSIS — K6289 Other specified diseases of anus and rectum: Secondary | ICD-10-CM | POA: Diagnosis not present

## 2020-10-20 LAB — GASTROINTESTINAL PANEL BY PCR, STOOL (REPLACES STOOL CULTURE)

## 2020-10-20 LAB — CBC
HCT: 38 % (ref 36.0–46.0)
Hemoglobin: 11.9 g/dL — ABNORMAL LOW (ref 12.0–15.0)
MCH: 27.5 pg (ref 26.0–34.0)
MCHC: 31.3 g/dL (ref 30.0–36.0)
MCV: 87.8 fL (ref 80.0–100.0)
Platelets: 129 10*3/uL — ABNORMAL LOW (ref 150–400)
RBC: 4.33 MIL/uL (ref 3.87–5.11)
RDW: 13.3 % (ref 11.5–15.5)
WBC: 11.5 10*3/uL — ABNORMAL HIGH (ref 4.0–10.5)
nRBC: 0 % (ref 0.0–0.2)

## 2020-10-20 LAB — BASIC METABOLIC PANEL
Anion gap: 7 (ref 5–15)
BUN: 14 mg/dL (ref 8–23)
CO2: 23 mmol/L (ref 22–32)
Calcium: 8.8 mg/dL — ABNORMAL LOW (ref 8.9–10.3)
Chloride: 108 mmol/L (ref 98–111)
Creatinine, Ser: 0.76 mg/dL (ref 0.44–1.00)
GFR, Estimated: >60 mL/min (ref >60)
Glucose, Bld: 99 mg/dL (ref 70–99)
Potassium: 3.7 mmol/L (ref 3.5–5.1)
Sodium: 138 mmol/L (ref 135–145)

## 2020-10-20 LAB — MAGNESIUM: Magnesium: 2 mg/dL (ref 1.7–2.4)

## 2020-10-20 LAB — GLUCOSE, CAPILLARY: Glucose-Capillary: 138 mg/dL — ABNORMAL HIGH (ref 70–99)

## 2020-10-20 MED ORDER — POLYETHYLENE GLYCOL 3350 17 G PO PACK: 17.0000 g | PACK | Freq: Every day | ORAL | 0 refills | Status: AC

## 2020-10-20 MED ORDER — CLONAZEPAM 0.5 MG PO TABS: 0.5000 mg | ORAL_TABLET | Freq: Every day | ORAL | 0 refills | Status: DC | PRN

## 2020-10-20 NOTE — Discharge Summary (Addendum)
PATIENT DETAILS Name: Angelica Kelley Age: 75 y.o. Sex: female Date of Birth: 03-21-1946 MRN: [id number]. Admitting Physician: Lequita Halt, MD [id number], [facility]  Admit Date: 10/18/2020 Discharge date: 10/20/2020  Recommendations for Outpatient Follow-up:  Follow up with PCP in 1-2 weeks Please obtain CMP/CBC in one week Has chronic rectosigmoid thickening-colonoscopy last was negative-please ensure outpatient follow-up with primary gastroenterologist. Has lung nodule-repeat CT chest in 3 to 6 months  Admitted From:  ALF   Disposition: ALF    Home Health:  No  Equipment/Devices: None  Discharge Condition: Stable  CODE STATUS: FULL CODE  Diet recommendation:  Diet Order             Diet - low sodium heart healthy           Diet Heart Room service appropriate? Yes; Fluid consistency: Thin  Diet effective now                    Brief Narrative: Patient is a 75 y.o. female with history of constipation predominant IBS, anxiety/depression, chronic diastolic heart failure PAF-not on anticoagulation due to frequent falls/dementia-presented to the ED from her ALF for evaluation of diarrhea following a bout of constipation for which she took laxatives.  While in the ED-patient had transient hypotension and was found to have colitis on CT abdomen/pelvis along with a leukocytosis of 34.4.  Patient was then admitted to the hospitalist service.  See below for further details.   Significant events: 6/7>> admit for evaluation of diarrhea-AKI-colitis on CT-WBC of 34.4K   Significant studies: 6/7>> CT abdomen/pelvis: Colitis involving rectosigmoid 6/7>> CXR: No pneumonia   Antimicrobial therapy: None   Microbiology data: 6/7>> COVID/influenza PCR: Negative 6/7>> blood culture: Negative 6/7>> urine culture: Negative 6/7>> stool C. difficile: Negative 6/8>> GI pathogen panel: Pending   Procedures : None   Consults: None  Brief Hospital  Course: Diarrhea: Occurred after laxative treatment for constipation-however has evidence of colitis on CT scan (some chronic rectosigmoid thickening-but worse) and leukocytosis-therefore concern that this could be infectious etiology.  Stool C. difficile studies were negative-GI pathogen panel is pending.  However with just supportive care-diarrhea has resolved-leukocytosis has improved significantly.  She was never started on antimicrobial therapy-I suspect that she could continue to be monitored off antibiotics given clinical and laboratory improvement.  GI pathogen panel is pending-but given clinical improvement-do not think even if it comes back positive-it will change management.  Attending MD at ALF can follow GI pathogen panel.   AKI: Hemodynamically mediated-improving with supportive care.  Avoid nephrotoxic agent   PAF: Maintaining sinus rhythm-reviewed outpatient cardiology note-not a candidate for anticoagulation given history of fall/subdural hematoma and GI bleeding   Constipation predominant IBS: Admitted with diarrhea-resume Senokot on discharge-have changed MiraLAX to daily dosing.  Follow with GI MD for further continued care.   Chronic rectosigmoid thickening: Colonoscopy into the 2021 was unremarkable.  Stable for outpatient follow-up by GI.   Anxiety/depression: Appears stable-continue Abilify/Zoloft and as needed Klonopin   Dementia: Appears mild-following most of my commands-answering all my questions appropriately-on Aricept   1.3 cm posterior right lower lobe pulmonary nodule: Repeat CT chest in 3 to 6 months-as recommended by radiology.   Procedures None  Discharge Diagnoses:  Active Problems:   AKI (acute kidney injury) ([place])   Diarrhea due to laxative abuse   Dehydration   Discharge Instructions:  Activity:  As tolerated   Discharge Instructions     Call MD for:  difficulty  breathing, headache or visual disturbances   Complete by: As directed    Diet -  low sodium heart healthy   Complete by: As directed    Discharge instructions   Complete by: As directed    Follow with Primary MD  [facility] in 1-2 weeks  Please get a complete blood count and chemistry panel checked by your Primary MD at your next visit, and again as instructed by your Primary MD.  Get Medicines reviewed and adjusted: Please take all your medications with you for your next visit with your Primary MD  Laboratory/radiological data: Please request your Primary MD to go over all hospital tests and procedure/radiological results at the follow up, please ask your Primary MD to get all Hospital records sent to his/her office.  In some cases, they will be blood work, cultures and biopsy results pending at the time of your discharge. Please request that your primary care M.D. follows up on these results.  Also Note the following: If you experience worsening of your admission symptoms, develop shortness of breath, life threatening emergency, suicidal or homicidal thoughts you must seek medical attention immediately by calling 911 or calling your MD immediately  if symptoms less severe.  You must read complete instructions/literature along with all the possible adverse reactions/side effects for all the Medicines you take and that have been prescribed to you. Take any new Medicines after you have completely understood and accpet all the possible adverse reactions/side effects.   Do not drive when taking Pain medications or sleeping medications (Benzodaizepines)  Do not take more than prescribed Pain, Sleep and Anxiety Medications. It is not advisable to combine anxiety,sleep and pain medications without talking with your primary care practitioner  Special Instructions: If you have smoked or chewed Tobacco  in the last 2 yrs please stop smoking, stop any regular Alcohol  and or any Recreational drug use.  Wear Seat belts while driving.  Please note: You were  cared for by a hospitalist during your hospital stay. Once you are discharged, your primary care physician will handle any further medical issues. Please note that NO REFILLS for any discharge medications will be authorized once you are discharged, as it is imperative that you return to your primary care physician (or establish a relationship with a primary care physician if you do not have one) for your post hospital discharge needs so that they can reassess your need for medications and monitor your lab values.   1.  You have a 1.3 cm right lung nodule which was incidentally seen on CT scan of your abdomen-please ask your primary care practitioner to repeat CT chest in 3 to 6 months.  In some cases-these nodules can turn cancerous.   Increase activity slowly   Complete by: As directed       Allergies as of 10/20/2020       Reactions   Lipitor [atorvastatin]    Fatigue   Prednisone Other (See Comments)   Change in mental status        Medication List     STOP taking these medications    minocycline 50 MG tablet Commonly known as: DYNACIN   mirabegron ER 25 MG Tb24 tablet Commonly known as: MYRBETRIQ       TAKE these medications    acetaminophen 325 MG tablet Commonly known as: TYLENOL Take 650 mg by mouth every 6 (six) hours as needed for moderate pain.   ARIPiprazole 2 MG tablet Commonly known as: ABILIFY  Take 2 mg by mouth in the morning. What changed: Another medication with the same name was removed. Continue taking this medication, and follow the directions you see here.   Artificial Tears 0.1-0.3 % Soln Generic drug: Dextran 70-Hypromellose Place 1 drop into both eyes every 12 (twelve) hours as needed (for dryness).   Biofreeze 4 % Gel Generic drug: Menthol (Topical Analgesic) Apply 1 application topically See admin instructions. Apply to the left shoulders 2 times a day   cholecalciferol 25 MCG (1000 UNIT) tablet Commonly known as: VITAMIN D Take 1,000 Units  by mouth daily.   clonazePAM 0.5 MG tablet Commonly known as: KLONOPIN Take 1 tablet (0.5 mg total) by mouth daily as needed for anxiety.   diphenhydrAMINE 25 MG tablet Commonly known as: BENADRYL Take 25 mg by mouth daily as needed for itching.   donepezil 10 MG tablet Commonly known as: ARICEPT Take 10 mg by mouth at bedtime.   gabapentin 100 MG capsule Commonly known as: NEURONTIN Take 200 mg by mouth 2 (two) times daily.   levocetirizine 5 MG tablet Commonly known as: XYZAL Take 5 mg by mouth in the morning.   magnesium citrate Soln Take 150 mLs by mouth daily as needed (for contipation).   pantoprazole 40 MG tablet Commonly known as: PROTONIX Take 1 tablet (40 mg total) by mouth 2 (two) times daily.   polyethylene glycol 17 g packet Commonly known as: MIRALAX / GLYCOLAX Take 17 g by mouth daily. What changed:  when to take this reasons to take this   rosuvastatin 40 MG tablet Commonly known as: CRESTOR Take 40 mg by mouth daily.   sennosides-docusate sodium 8.6-50 MG tablet Commonly known as: SENOKOT-S Take 1 tablet by mouth daily.   sertraline 100 MG tablet Commonly known as: ZOLOFT Take 200 mg by mouth in the morning.   thiamine 100 MG tablet Commonly known as: Vitamin B-1 Take 250 mg by mouth 2 (two) times a day.   zolpidem 5 MG tablet Commonly known as: AMBIEN Take 5 mg by mouth at bedtime.       ASK your doctor about these medications    Menthol (Topical Analgesic) 4 % Gel Apply 1 application topically 2 (two) times a day. Left shoulder pain   Hypromellose 0.4 % Soln Apply 2 drops to eye 2 (two) times a day.        Follow-up Information     [facility] Schedule an appointment as soon as possible for a visit in 1 week(s).   Specialty: Geriatric Medicine Why: Hospital follow up Contact information: [place] [address] [phone]         Nahser, Wonda Cheng, MD Follow up in 1  month(s).   Specialty: Cardiology Contact information: [place] [place] [address] [phone]                Allergies  Allergen Reactions   Lipitor [Atorvastatin]     Fatigue   Prednisone Other (See Comments)    Change in mental status    Consultations:  None   Other Procedures/Studies: CT ABDOMEN PELVIS WO CONTRAST  Result Date: 10/18/2020 CLINICAL DATA:  Leukocytosis (WBC 34), diarrhea, dementia. Possible colitis. EXAM: CT ABDOMEN AND PELVIS WITHOUT CONTRAST TECHNIQUE: Multidetector CT imaging of the abdomen and pelvis was performed following the standard protocol without IV contrast. COMPARISON:  01/11/2019 CT abdomen/pelvis. FINDINGS: Lower chest: New indistinct subsolid 1.3 cm posterior right lower lobe pulmonary  nodule (series 7/image 45). Patchy reticulation and ground-glass opacity throughout the periphery of both lung bases, improved from prior. Coronary atherosclerosis. Hepatobiliary: Normal liver size. No liver mass. Normal gallbladder with no radiopaque cholelithiasis. No biliary ductal dilatation. Pancreas: Embolization coils in the region of the pancreatic head. No pancreatic mass or duct dilation. Spleen: Normal size. No mass. Adrenals/Urinary Tract: Normal adrenals. Hyperdense 1.0 cm posterior interpolar left renal cortical lesion (series 3/image 43), new. No additional contour deforming renal lesions. No renal stones. No hydronephrosis. Normal bladder. Stomach/Bowel: Normal non-distended stomach. Normal caliber small bowel with no small bowel wall thickening. Status post appendectomy with normal diminutive appendiceal stump. Circumferential wall thickening throughout the rectosigmoid junction and rectum, which appears chronic but worsened from prior CT, with slight haziness of the perirectal fat. Moderate gas and scattered fluid levels in the transverse and left colon. No diverticulosis. Vascular/Lymphatic: Atherosclerotic nonaneurysmal  abdominal aorta. No pathologically enlarged lymph nodes in the abdomen or pelvis. Reproductive: Status post hysterectomy, with no abnormal findings at the vaginal cuff. No adnexal mass. Other: No pneumoperitoneum, ascites or focal fluid collection. Musculoskeletal: No aggressive appearing focal osseous lesions. Chronic mild L3, severe L4 and moderate L5 vertebral compression fractures with marked degenerative disc disease throughout the lower lumbar spine. IMPRESSION: 1. Circumferential wall thickening throughout the rectosigmoid junction and rectum, which appears chronic but worsened from prior 2019 CT, with slight haziness of the perirectal fat. Findings suggest a nonspecific infectious or inflammatory proctitis, with the differential including C diff. 2. Moderate gas and scattered fluid levels in the transverse and left colon, indicative of a nonspecific malaborptive state. No bowel obstruction. 3. New indistinct subsolid 1.3 cm posterior right lower lobe pulmonary nodule. Follow-up non-contrast chest CT recommended at 3-6 months to confirm persistence. If unchanged, and solid component remains <6 mm, annual CT is recommended until 5 years of stability has been established. If persistent these nodules should be considered highly suspicious if the solid component of the nodule is 6 mm or greater in size and enlarging. This recommendation follows the consensus statement: Guidelines for Management of Incidental Pulmonary Nodules Detected on CT Images: From the Fleischner Society 2017; Radiology 2017; 284:228-243. 4. Nonspecific patchy pulmonary fibrosis at the peripheral lung bases, improved from prior, suggesting evolving postinflammatory fibrosis. 5. Aortic Atherosclerosis (ICD10-I70.0). Electronically Signed   By: Ilona Sorrel M.D.   On: 10/18/2020 14:56   DG Chest Port 1 View  Result Date: 10/18/2020 CLINICAL DATA:  Questionable sepsis. EXAM: PORTABLE CHEST 1 VIEW COMPARISON:  01/13/2019.  10/12/2017.  CT  cervical spine 10/12/2017. FINDINGS: Cardiomegaly. Stable atherosclerotic vascular calcifications. No pulmonary venous congestion. Chronic interstitial changes are noted. No focal alveolar infiltrate. No pleural effusion or pneumothorax. Degenerative change thoracic spine. IMPRESSION: 1. Chronic interstitial changes. No focal alveolar infiltrate. Exam stable from prior exam. 2.  Cardiomegaly.  No pulmonary venous congestion. Electronically Signed   By: Marcello Moores  Register   On: 10/18/2020 13:46     TODAY-DAY OF DISCHARGE:  Subjective:   Angelica Kelley today has no headache,no chest abdominal pain,no new weakness tingling or numbness, feels much better wants to go home today.   Objective:   Blood pressure (!) 123/59, pulse 62, temperature 99 F (37.2 C), temperature source Oral, resp. rate 16, height 5' 7.2" (1.707 m), weight 67.9 kg, SpO2 96 %.  Intake/Output Summary (Last 24 hours) at 10/20/2020 1429 Last data filed at 10/20/2020 0759 Gross per 24 hour  Intake 1143.77 ml  Output --  Net 1143.77 ml  Filed Weights   10/18/20 1134 10/18/20 2045  Weight: 63.5 kg 67.9 kg    Exam: Awake Alert, Oriented *3, No new F.N deficits, Normal affect Cricket.AT,PERRAL Supple Neck,No JVD, No cervical lymphadenopathy appriciated.  Symmetrical Chest wall movement, Good air movement bilaterally, CTAB RRR,No Gallops,Rubs or new Murmurs, No Parasternal Heave +ve B.Sounds, Abd Soft, Non tender, No organomegaly appriciated, No rebound -guarding or rigidity. No Cyanosis, Clubbing or edema, No new Rash or bruise   PERTINENT RADIOLOGIC STUDIES: No results found.   PERTINENT LAB RESULTS: CBC: Recent Labs    10/19/20 0037 10/20/20 0239  WBC 22.0* 11.5*  HGB 11.4* 11.9*  HCT 36.5 38.0  PLT 142* 129*   CMET CMP     Component Value Date/Time   NA 138 10/20/2020 0239   NA 142 09/02/2017 0000   K 3.7 10/20/2020 0239   CL 108 10/20/2020 0239   CO2 23 10/20/2020 0239   GLUCOSE 99 10/20/2020 0239   BUN 14  10/20/2020 0239   BUN 22 (A) 09/02/2017 0000   CREATININE 0.76 10/20/2020 0239   CREATININE 1.00 (H) 08/07/2017 1528   CALCIUM 8.8 (L) 10/20/2020 0239   PROT 6.6 10/18/2020 1142   ALBUMIN 3.8 10/18/2020 1142   AST 28 10/18/2020 1142   ALT 18 10/18/2020 1142   ALKPHOS 83 10/18/2020 1142   BILITOT 0.6 10/18/2020 1142   GFRNONAA >60 10/20/2020 0239   GFRNONAA 55 (L) 08/07/2017 1528   GFRAA >60 01/20/2019 1300   GFRAA 64 08/07/2017 1528    GFR Estimated Creatinine Clearance: 57.7 mL/min (by C-G formula based on SCr of 0.76 mg/dL). Recent Labs    10/18/20 1142  LIPASE 44   No results for input(s): CKTOTAL, CKMB, CKMBINDEX, TROPONINI in the last 72 hours. Invalid input(s): POCBNP No results for input(s): DDIMER in the last 72 hours. No results for input(s): HGBA1C in the last 72 hours. No results for input(s): CHOL, HDL, LDLCALC, TRIG, CHOLHDL, LDLDIRECT in the last 72 hours. No results for input(s): TSH, T4TOTAL, T3FREE, THYROIDAB in the last 72 hours.  Invalid input(s): FREET3 No results for input(s): VITAMINB12, FOLATE, FERRITIN, TIBC, IRON, RETICCTPCT in the last 72 hours. Coags: Recent Labs    10/18/20 1300  INR 1.1   Microbiology: Recent Results (from the past 240 hour(s))  Resp Panel by RT-PCR (Flu A&B, Covid) Nasopharyngeal Swab     Status: None   Collection Time: 10/18/20 12:57 PM   Specimen: Nasopharyngeal Swab; Nasopharyngeal(NP) swabs in vial transport medium  Result Value Ref Range Status   SARS Coronavirus 2 by RT PCR NEGATIVE NEGATIVE Final    Comment: (NOTE) SARS-CoV-2 target nucleic acids are NOT DETECTED.  The SARS-CoV-2 RNA is generally detectable in upper respiratory specimens during the acute phase of infection. The lowest concentration of SARS-CoV-2 viral copies this assay can detect is 138 copies/mL. A negative result does not preclude SARS-Cov-2 infection and should not be used as the sole basis for treatment or other patient management  decisions. A negative result may occur with  improper specimen collection/handling, submission of specimen other than nasopharyngeal swab, presence of viral mutation(s) within the areas targeted by this assay, and inadequate number of viral copies(<138 copies/mL). A negative result must be combined with clinical observations, patient history, and epidemiological information. The expected result is Negative.  Fact Sheet for Patients:  EntrepreneurPulse.com.au  Fact Sheet for Healthcare Providers:  IncredibleEmployment.be  This test is no t yet approved or cleared by the [place] FDA and  has been  authorized for detection and/or diagnosis of SARS-CoV-2 by FDA under an Emergency Use Authorization (EUA). This EUA will remain  in effect (meaning this test can be used) for the duration of the COVID-19 declaration under Section 564(b)(1) of the Act, 21 U.S.C.section 360bbb-3(b)(1), unless the authorization is terminated  or revoked sooner.       Influenza A by PCR NEGATIVE NEGATIVE Final   Influenza B by PCR NEGATIVE NEGATIVE Final    Comment: (NOTE) The Xpert Xpress SARS-CoV-2/FLU/RSV plus assay is intended as an aid in the diagnosis of influenza from Nasopharyngeal swab specimens and should not be used as a sole basis for treatment. Nasal washings and aspirates are unacceptable for Xpert Xpress SARS-CoV-2/FLU/RSV testing.  Fact Sheet for Patients: EntrepreneurPulse.com.au  Fact Sheet for Healthcare Providers: IncredibleEmployment.be  This test is not yet approved or cleared by the [place] FDA and has been authorized for detection and/or diagnosis of SARS-CoV-2 by FDA under an Emergency Use Authorization (EUA). This EUA will remain in effect (meaning this test can be used) for the duration of the COVID-19 declaration under Section 564(b)(1) of the Act, 21 U.S.C. section 360bbb-3(b)(1), unless the  authorization is terminated or revoked.  Performed at [facility], [place] [address], [place], [place] [address]   Blood culture (routine single)     Status: None (Preliminary result)   Collection Time: 10/18/20  1:00 PM   Specimen: BLOOD  Result Value Ref Range Status   Specimen Description BLOOD RIGHT ANTECUBITAL  Final   Special Requests   Final    BOTTLES DRAWN AEROBIC AND ANAEROBIC Blood Culture adequate volume   Culture   Final    NO GROWTH 2 DAYS Performed at [facility], [place] [address], [place], [place] [address]    Report Status PENDING  Incomplete  Urine culture     Status: None   Collection Time: 10/18/20  2:39 PM   Specimen: In/Out Cath Urine  Result Value Ref Range Status   Specimen Description IN/OUT CATH URINE  Final   Special Requests NONE  Final   Culture   Final    NO GROWTH Performed at [facility], [place] [address], [place], [place] [address]    Report Status 10/19/2020 FINAL  Final  Stool culture (children & immunocomp patients)     Status: None (Preliminary result)   Collection Time: 10/18/20  3:29 PM   Specimen: STOOL  Result Value Ref Range Status   Salmonella/Shigella Screen PENDING  Incomplete   Campylobacter Culture PENDING  Incomplete   E coli, Shiga toxin Assay Negative Negative Final    Comment: (NOTE) Performed At: [facility] [place], [place] [id number] Rush Farmer MD [id number]   C Difficile Quick Screen w PCR reflex     Status: None   Collection Time: 10/18/20  3:39 PM   Specimen: STOOL  Result Value Ref Range Status   C Diff antigen NEGATIVE NEGATIVE Final   C Diff toxin NEGATIVE NEGATIVE Final   C Diff interpretation No C. difficile detected.  Final    Comment: Performed at [facility], [place] [address] [place], [place] [address]  Gastrointestinal Panel by PCR , Stool     Status: None   Collection Time: 10/19/20  6:37 AM   Specimen: Stool  Result Value Ref Range Status    Campylobacter species NOT DETECTED NOT DETECTED Final   Plesimonas shigelloides NOT DETECTED NOT DETECTED Final   Salmonella species NOT DETECTED  NOT DETECTED Final   Yersinia enterocolitica NOT DETECTED NOT DETECTED Final   Vibrio species NOT DETECTED NOT DETECTED Final   Vibrio cholerae NOT DETECTED NOT DETECTED Final   Enteroaggregative E coli (EAEC) NOT DETECTED NOT DETECTED Final   Enteropathogenic E coli (EPEC) NOT DETECTED NOT DETECTED Final   Enterotoxigenic E coli (ETEC) NOT DETECTED NOT DETECTED Final   Shiga like toxin producing E coli (STEC) NOT DETECTED NOT DETECTED Final   Shigella/Enteroinvasive E coli (EIEC) NOT DETECTED NOT DETECTED Final   Cryptosporidium NOT DETECTED NOT DETECTED Final   Cyclospora cayetanensis NOT DETECTED NOT DETECTED Final   Entamoeba histolytica NOT DETECTED NOT DETECTED Final   Giardia lamblia NOT DETECTED NOT DETECTED Final   Adenovirus F40/41 NOT DETECTED NOT DETECTED Final   Astrovirus NOT DETECTED NOT DETECTED Final   Norovirus GI/GII NOT DETECTED NOT DETECTED Final   Rotavirus A NOT DETECTED NOT DETECTED Final   Sapovirus (I, II, IV, and V) NOT DETECTED NOT DETECTED Final    Comment: Performed at [facility], [address], [place], [place] [address]    FURTHER DISCHARGE INSTRUCTIONS:  Get Medicines reviewed and adjusted: Please take all your medications with you for your next visit with your Primary MD  Laboratory/radiological data: Please request your Primary MD to go over all hospital tests and procedure/radiological results at the follow up, please ask your Primary MD to get all Hospital records sent to his/her office.  In some cases, they will be blood work, cultures and biopsy results pending at the time of your discharge. Please request that your primary care M.D. goes through all the records of your hospital data and follows up on these results.  Also Note the following: If you experience worsening of your admission  symptoms, develop shortness of breath, life threatening emergency, suicidal or homicidal thoughts you must seek medical attention immediately by calling 911 or calling your MD immediately  if symptoms less severe.  You must read complete instructions/literature along with all the possible adverse reactions/side effects for all the Medicines you take and that have been prescribed to you. Take any new Medicines after you have completely understood and accpet all the possible adverse reactions/side effects.   Do not drive when taking Pain medications or sleeping medications (Benzodaizepines)  Do not take more than prescribed Pain, Sleep and Anxiety Medications. It is not advisable to combine anxiety,sleep and pain medications without talking with your primary care practitioner  Special Instructions: If you have smoked or chewed Tobacco  in the last 2 yrs please stop smoking, stop any regular Alcohol  and or any Recreational drug use.  Wear Seat belts while driving.  Please note: You were cared for by a hospitalist during your hospital stay. Once you are discharged, your primary care physician will handle any further medical issues. Please note that NO REFILLS for any discharge medications will be authorized once you are discharged, as it is imperative that you return to your primary care physician (or establish a relationship with a primary care physician if you do not have one) for your post hospital discharge needs so that they can reassess your need for medications and monitor your lab values.  Total Time spent coordinating discharge including counseling, education and face to face time equals 35 minutes.  SignedOren Binet 10/20/2020 2:29 PM

## 2020-10-20 NOTE — TOC Transition Note (Signed)
Transition of Care [facility]) - CM/SW Discharge Note   Patient Details  Name: Angelica Kelley MRN: [id number] Date of Birth: 09-11-1945  Transition of Care [facility]) CM/SW Contact:  Benard Halsted, LCSW Phone Number: 10/20/2020, 1:48 PM   Clinical Narrative:    Patient will DC to: [facility] Anticipated DC date: 10/20/20 Family notified: Patient oriented x4 and will notify family Transport by: Corey Harold   Per MD patient ready for DC to [facility]. RN to call report prior to discharge [phone]). RN, patient, patient's family, and facility notified of DC. Discharge Summary and FL2 sent to facility. DC packet on chart with script. Ambulance transport requested for patient.   CSW will sign off for now as social work intervention is no longer needed. Please consult [place] again if new needs arise.     Final next level of care: Assisted Living Barriers to Discharge: No Barriers Identified   Patient Goals and CMS Choice Patient states their goals for this hospitalization and ongoing recovery are:: Return to ALF CMS Medicare.gov Compare Post Acute Care list provided to:: Patient Choice offered to / list presented to : Patient  Discharge Placement              Patient chooses bed at: Other - please specify in the comment section below: Patient to be transferred to facility by: [place] Name of family member notified: Patient notifying family Patient and family notified of of transfer: 10/20/20  Discharge Plan and Services In-house Referral: Clinical Social Work                                   Social Determinants of Health (SDOH) Interventions     Readmission Risk Interventions No flowsheet data found.

## 2020-10-20 NOTE — Progress Notes (Signed)
Report given to [place] at [facility]. Discharge summary updated and will send with patient.

## 2020-10-20 NOTE — TOC Initial Note (Signed)
Transition of Care [facility]) - Initial/Assessment Note    Patient Details  Name: Angelica Kelley MRN: [id number] Date of Birth: Aug 17, 1945  Transition of Care [facility]) CM/SW Contact:    Benard Halsted, LCSW Phone Number: 10/20/2020, 1:47 PM  Clinical Narrative:                 Patient ready to discharge back to [facility]. CSW spoke with RN Brittney at facility and faxed over [facility] and DC Summary. She reviewed documents and is ready to accept patient back. Will arrange PTAR.  Expected Discharge Plan: Assisted Living Barriers to Discharge: No Barriers Identified   Patient Goals and CMS Choice Patient states their goals for this hospitalization and ongoing recovery are:: Return to ALF CMS Medicare.gov Compare Post Acute Care list provided to:: Patient Choice offered to / list presented to : Patient  Expected Discharge Plan and Services Expected Discharge Plan: Assisted Living In-house Referral: Clinical Social Work     Living arrangements for the past 2 months: [place] Expected Discharge Date: 10/20/20                                    Prior Living Arrangements/Services Living arrangements for the past 2 months: [place] Lives with:: Facility Resident Patient language and need for interpreter reviewed:: Yes Do you feel safe going back to the place where you live?: Yes      Need for Family Participation in Patient Care: No (Comment) Care giver support system in place?: Yes (comment)   Criminal Activity/Legal Involvement Pertinent to Current Situation/Hospitalization: No - Comment as needed  Activities of Daily Living      Permission Sought/Granted Permission sought to share information with : Facility Arts administrator granted to share info w AGENCY: [place]        Emotional Assessment Appearance:: Appears stated age Attitude/Demeanor/Rapport: Engaged Affect (typically observed): Accepting,  Appropriate Orientation: : Oriented to Self, Oriented to Place, Oriented to  Time, Oriented to Situation Alcohol / Substance Use: Not Applicable Psych Involvement: No (comment)  Admission diagnosis:  Diarrhea of presumed infectious origin [R19.7] Dehydration [E86.0] Proctitis [K62.89] Pulmonary nodule [R91.1] AKI (acute kidney injury) ([place]) [N17.9] Patient Active Problem List   Diagnosis Date Noted   Diarrhea due to laxative abuse 10/18/2020   Dehydration 10/18/2020   Diarrhea of presumed infectious origin    Duodenal ulcer    Acute blood loss anemia 01/11/2019   Syncope 10/12/2017   Benzodiazepine overdose 09/07/2017   Dementia ([place]) 09/07/2017   Chronic diastolic heart failure ([place]) 09/07/2017   Symptomatic anemia 09/07/2017   AKI (acute kidney injury) ([place]) 08/19/2017   Hypokalemia 08/19/2017   Depression with anxiety 05/09/2017   Fall 05/09/2017   Slurred speech 05/09/2017   CKD (chronic kidney disease) stage 2, GFR 60-89 ml/min 04/23/2017   OAB (overactive bladder) 06/09/2014   Vitamin D deficiency 10/27/2013   Medication management 10/27/2013   Hypertension    Hyperlipidemia, mixed    GERD (gastroesophageal reflux disease)    Bipolar depression ([place])    Alcoholism ([place])    IBS (irritable bowel syndrome)    Other abnormal glucose    PCP:  [facility]:   [place], [place] - [address] [place] [address] Phone: [phone] Fax: [phone]     Social Determinants  of Health (SDOH) Interventions    Readmission Risk Interventions No flowsheet data found.

## 2020-10-20 NOTE — NC FL2 (Signed)
[place] LEVEL OF CARE SCREENING TOOL     IDENTIFICATION  Patient Name: Angelica Kelley Birthdate: [birth year] Sex: female Admission Date (Current Location): 10/18/2020  [facility] and [place] Number:  Herbalist and Address:  [facility], [place] [address] [place], [place] [address]      Provider Number: [id number]  Attending Physician Name and Address:  Jonetta Osgood, MD  Relative Name and Phone Number:  [name] niece, [phone]    Current Level of Care: Hospital Recommended Level of Care: Assisted Living Facility Prior Approval Number:    Date Approved/Denied:   PASRR Number:    Discharge Plan: Other (Comment) (ALF)    Current Diagnoses: Patient Active Problem List   Diagnosis Date Noted   Diarrhea due to laxative abuse 10/18/2020   Dehydration 10/18/2020   Diarrhea of presumed infectious origin    Duodenal ulcer    Acute blood loss anemia 01/11/2019   Syncope 10/12/2017   Benzodiazepine overdose 09/07/2017   Dementia ([place]) 09/07/2017   Chronic diastolic heart failure ([place]) 09/07/2017   Symptomatic anemia 09/07/2017   AKI (acute kidney injury) ([place]) 08/19/2017   Hypokalemia 08/19/2017   Depression with anxiety 05/09/2017   Fall 05/09/2017   Slurred speech 05/09/2017   CKD (chronic kidney disease) stage 2, GFR 60-89 ml/min 04/23/2017   OAB (overactive bladder) 06/09/2014   Vitamin D deficiency 10/27/2013   Medication management 10/27/2013   Hypertension    Hyperlipidemia, mixed    GERD (gastroesophageal reflux disease)    Bipolar depression (HCC)    Alcoholism (HCC)    IBS (irritable bowel syndrome)    Other abnormal glucose     Orientation RESPIRATION BLADDER Height & Weight     Self, Time, Situation, Place  Normal Continent Weight: 149 lb 11.1 oz (67.9 kg) Height:  5' 7.2" (170.7 cm)  BEHAVIORAL SYMPTOMS/MOOD NEUROLOGICAL BOWEL NUTRITION STATUS      Continent Diet (Regular, No added salt)  AMBULATORY  STATUS COMMUNICATION OF NEEDS Skin   Limited Assist Verbally Normal                       Personal Care Assistance Level of Assistance  Bathing, Feeding, Dressing Bathing Assistance: Limited assistance Feeding assistance: Independent Dressing Assistance: Independent     Functional Limitations Info             SPECIAL CARE FACTORS FREQUENCY                       Contractures Contractures Info: Not present    Additional Factors Info  Code Status, Allergies, Psychotropic Code Status Info: Full Allergies Info: Lipitor (Atorvastatin), Prednisone Psychotropic Info: Abilify, Ambien, Zoloft,         Current Medications (10/20/2020):    Discharge Medications: STOP taking these medications     mirabegron ER 25 MG Tb24 tablet Commonly known as: MYRBETRIQ           TAKE these medications     acetaminophen 325 MG tablet Commonly known as: TYLENOL Take 650 mg by mouth every 6 (six) hours as needed for moderate pain.    ARIPiprazole 2 MG tablet Commonly known as: ABILIFY Take 2 mg by mouth in the morning. What changed: Another medication with the same name was removed. Continue taking this medication, and follow the directions you see here.    Artificial Tears 0.1-0.3 % Soln Generic drug: Dextran 70-Hypromellose Place 1 drop into both  eyes every 12 (twelve) hours as needed (for dryness).    Biofreeze 4 % Gel Generic drug: Menthol (Topical Analgesic) Apply 1 application topically See admin instructions. Apply to the left shoulders 2 times a day    cholecalciferol 25 MCG (1000 UNIT) tablet Commonly known as: VITAMIN D Take 1,000 Units by mouth daily.    clonazePAM 0.5 MG tablet Commonly known as: KLONOPIN Take 1 tablet (0.5 mg total) by mouth daily as needed for anxiety.    diphenhydrAMINE 25 MG tablet Commonly known as: BENADRYL Take 25 mg by mouth daily as needed for itching.    donepezil 10 MG tablet Commonly known as: ARICEPT Take 10 mg by  mouth at bedtime.    gabapentin 100 MG capsule Commonly known as: NEURONTIN Take 200 mg by mouth 2 (two) times daily.    levocetirizine 5 MG tablet Commonly known as: XYZAL Take 5 mg by mouth in the morning.    magnesium citrate Soln Take 150 mLs by mouth daily as needed (for contipation).    minocycline 50 MG tablet Commonly known as: DYNACIN Take 50 mg by mouth in the morning.    pantoprazole 40 MG tablet Commonly known as: PROTONIX Take 1 tablet (40 mg total) by mouth 2 (two) times daily.    polyethylene glycol 17 g packet Commonly known as: MIRALAX / GLYCOLAX Take 17 g by mouth daily. What changed: when to take this reasons to take this    rosuvastatin 40 MG tablet Commonly known as: CRESTOR Take 40 mg by mouth daily.    sennosides-docusate sodium 8.6-50 MG tablet Commonly known as: SENOKOT-S Take 1 tablet by mouth daily.    sertraline 100 MG tablet Commonly known as: ZOLOFT Take 200 mg by mouth in the morning.    thiamine 100 MG tablet Commonly known as: Vitamin B-1 Take 250 mg by mouth 2 (two) times a day.    zolpidem 5 MG tablet Commonly known as: AMBIEN Take 5 mg by mouth at bedtime.      Relevant Imaging Results:  Relevant Lab Results:   Additional Information SSN: [id number]  [place], LCSW

## 2020-10-22 LAB — STOOL CULTURE REFLEX - CMPCXR

## 2020-10-22 LAB — STOOL CULTURE: E coli, Shiga toxin Assay: NEGATIVE

## 2020-10-22 LAB — STOOL CULTURE REFLEX - RSASHR

## 2020-10-23 LAB — CULTURE, BLOOD (SINGLE)
Culture: NO GROWTH
Special Requests: ADEQUATE

## 2020-12-13 ENCOUNTER — Other Ambulatory Visit: Payer: Self-pay | Admitting: Nurse Practitioner

## 2020-12-13 DIAGNOSIS — Z1231 Encounter for screening mammogram for malignant neoplasm of breast: Secondary | ICD-10-CM

## 2021-03-07 ENCOUNTER — Ambulatory Visit
Admission: RE | Admit: 2021-03-07 | Discharge: 2021-03-07 | Disposition: A | Discharge status: Home / Self Care | Payer: Medicare Other | Source: Ambulatory Visit | Attending: Nurse Practitioner | Admitting: Nurse Practitioner

## 2021-03-07 ENCOUNTER — Other Ambulatory Visit: Payer: Self-pay

## 2021-03-07 DIAGNOSIS — Z1231 Encounter for screening mammogram for malignant neoplasm of breast: Secondary | ICD-10-CM

## 2021-07-20 ENCOUNTER — Telehealth: Payer: Self-pay | Admitting: Cardiovascular Disease

## 2021-07-20 NOTE — Telephone Encounter (Signed)
Spoke with Desiree regarding patient weights. ? ?She states she is doing chart review at [facility] and would like to know if patient is to continued to be weighed 3 times weekly with instruction to call for 5lb weight gain in a week. She states patient's most recent weight was 153. ? ?Desiree is asking if patient can be weighed weekly instead of 3 times a week. ? ?Desiree states she is also faxing a request to change the order for weighing patient to our office. ? ?Will forward to Dr. Acie Fredrickson for review. ?

## 2021-07-20 NOTE — Telephone Encounter (Unsigned)
Calling in to see if patient has any special instructions. Please advise  ?

## 2022-01-08 ENCOUNTER — Other Ambulatory Visit: Payer: Self-pay | Admitting: Nurse Practitioner

## 2022-01-08 DIAGNOSIS — Z1231 Encounter for screening mammogram for malignant neoplasm of breast: Secondary | ICD-10-CM

## 2022-03-08 ENCOUNTER — Ambulatory Visit: Payer: Medicare Other

## 2022-05-02 ENCOUNTER — Ambulatory Visit: Payer: Medicare Other

## 2022-06-26 ENCOUNTER — Ambulatory Visit
Admission: RE | Admit: 2022-06-26 | Discharge: 2022-06-26 | Disposition: A | Discharge status: Home / Self Care | Payer: Medicare HMO | Source: Ambulatory Visit | Attending: Nurse Practitioner | Admitting: Nurse Practitioner

## 2022-06-26 DIAGNOSIS — Z1231 Encounter for screening mammogram for malignant neoplasm of breast: Secondary | ICD-10-CM

## 2022-07-03 ENCOUNTER — Encounter: Payer: Self-pay | Admitting: Gastroenterology

## 2023-03-22 ENCOUNTER — Other Ambulatory Visit: Payer: Self-pay | Admitting: Nurse Practitioner

## 2023-03-22 DIAGNOSIS — Z1231 Encounter for screening mammogram for malignant neoplasm of breast: Secondary | ICD-10-CM

## 2023-06-06 ENCOUNTER — Inpatient Hospital Stay (HOSPITAL_COMMUNITY)
Admission: EM | Admit: 2023-06-06 | Discharge: 2023-06-12 | DRG: 189 | Disposition: A | Discharge status: Nursing Home (Includes ICF) | Payer: Medicare HMO | Source: Skilled Nursing Facility | Attending: Internal Medicine | Admitting: Internal Medicine

## 2023-06-06 ENCOUNTER — Emergency Department (HOSPITAL_COMMUNITY): Payer: Medicare HMO

## 2023-06-06 ENCOUNTER — Other Ambulatory Visit: Payer: Self-pay

## 2023-06-06 ENCOUNTER — Encounter (HOSPITAL_COMMUNITY): Payer: Self-pay

## 2023-06-06 DIAGNOSIS — Z809 Family history of malignant neoplasm, unspecified: Secondary | ICD-10-CM

## 2023-06-06 DIAGNOSIS — Z66 Do not resuscitate: Secondary | ICD-10-CM | POA: Diagnosis present

## 2023-06-06 DIAGNOSIS — I350 Nonrheumatic aortic (valve) stenosis: Secondary | ICD-10-CM | POA: Diagnosis present

## 2023-06-06 DIAGNOSIS — Z79899 Other long term (current) drug therapy: Secondary | ICD-10-CM

## 2023-06-06 DIAGNOSIS — D509 Iron deficiency anemia, unspecified: Secondary | ICD-10-CM | POA: Diagnosis present

## 2023-06-06 DIAGNOSIS — I480 Paroxysmal atrial fibrillation: Secondary | ICD-10-CM | POA: Diagnosis present

## 2023-06-06 DIAGNOSIS — K581 Irritable bowel syndrome with constipation: Secondary | ICD-10-CM | POA: Diagnosis present

## 2023-06-06 DIAGNOSIS — I5032 Chronic diastolic (congestive) heart failure: Secondary | ICD-10-CM | POA: Diagnosis present

## 2023-06-06 DIAGNOSIS — Z1152 Encounter for screening for COVID-19: Secondary | ICD-10-CM | POA: Diagnosis not present

## 2023-06-06 DIAGNOSIS — F32A Depression, unspecified: Secondary | ICD-10-CM | POA: Diagnosis present

## 2023-06-06 DIAGNOSIS — K219 Gastro-esophageal reflux disease without esophagitis: Secondary | ICD-10-CM | POA: Diagnosis present

## 2023-06-06 DIAGNOSIS — G4700 Insomnia, unspecified: Secondary | ICD-10-CM | POA: Diagnosis present

## 2023-06-06 DIAGNOSIS — Z833 Family history of diabetes mellitus: Secondary | ICD-10-CM | POA: Diagnosis not present

## 2023-06-06 DIAGNOSIS — J205 Acute bronchitis due to respiratory syncytial virus: Secondary | ICD-10-CM | POA: Diagnosis present

## 2023-06-06 DIAGNOSIS — Z888 Allergy status to other drugs, medicaments and biological substances status: Secondary | ICD-10-CM

## 2023-06-06 DIAGNOSIS — E876 Hypokalemia: Secondary | ICD-10-CM | POA: Diagnosis present

## 2023-06-06 DIAGNOSIS — F03911 Unspecified dementia, unspecified severity, with agitation: Secondary | ICD-10-CM | POA: Diagnosis present

## 2023-06-06 DIAGNOSIS — N182 Chronic kidney disease, stage 2 (mild): Secondary | ICD-10-CM | POA: Diagnosis present

## 2023-06-06 DIAGNOSIS — F05 Delirium due to known physiological condition: Secondary | ICD-10-CM | POA: Diagnosis not present

## 2023-06-06 DIAGNOSIS — J210 Acute bronchiolitis due to respiratory syncytial virus: Principal | ICD-10-CM | POA: Diagnosis present

## 2023-06-06 DIAGNOSIS — Z806 Family history of leukemia: Secondary | ICD-10-CM

## 2023-06-06 DIAGNOSIS — E785 Hyperlipidemia, unspecified: Secondary | ICD-10-CM | POA: Diagnosis present

## 2023-06-06 DIAGNOSIS — Z781 Physical restraint status: Secondary | ICD-10-CM

## 2023-06-06 DIAGNOSIS — F0280 Dementia in other diseases classified elsewhere without behavioral disturbance: Secondary | ICD-10-CM | POA: Diagnosis not present

## 2023-06-06 DIAGNOSIS — G300 Alzheimer's disease with early onset: Secondary | ICD-10-CM | POA: Diagnosis not present

## 2023-06-06 DIAGNOSIS — J9601 Acute respiratory failure with hypoxia: Secondary | ICD-10-CM | POA: Diagnosis present

## 2023-06-06 DIAGNOSIS — J470 Bronchiectasis with acute lower respiratory infection: Secondary | ICD-10-CM | POA: Diagnosis present

## 2023-06-06 DIAGNOSIS — Z8249 Family history of ischemic heart disease and other diseases of the circulatory system: Secondary | ICD-10-CM | POA: Diagnosis not present

## 2023-06-06 DIAGNOSIS — F0393 Unspecified dementia, unspecified severity, with mood disturbance: Secondary | ICD-10-CM | POA: Diagnosis present

## 2023-06-06 DIAGNOSIS — F0394 Unspecified dementia, unspecified severity, with anxiety: Secondary | ICD-10-CM | POA: Diagnosis present

## 2023-06-06 DIAGNOSIS — I130 Hypertensive heart and chronic kidney disease with heart failure and stage 1 through stage 4 chronic kidney disease, or unspecified chronic kidney disease: Secondary | ICD-10-CM | POA: Diagnosis present

## 2023-06-06 DIAGNOSIS — F418 Other specified anxiety disorders: Secondary | ICD-10-CM | POA: Diagnosis present

## 2023-06-06 DIAGNOSIS — B338 Other specified viral diseases: Principal | ICD-10-CM

## 2023-06-06 DIAGNOSIS — Z87891 Personal history of nicotine dependence: Secondary | ICD-10-CM

## 2023-06-06 DIAGNOSIS — Z9181 History of falling: Secondary | ICD-10-CM

## 2023-06-06 DIAGNOSIS — Z8673 Personal history of transient ischemic attack (TIA), and cerebral infarction without residual deficits: Secondary | ICD-10-CM

## 2023-06-06 DIAGNOSIS — Z820 Family history of epilepsy and other diseases of the nervous system: Secondary | ICD-10-CM

## 2023-06-06 DIAGNOSIS — F0390 Unspecified dementia without behavioral disturbance: Secondary | ICD-10-CM | POA: Diagnosis present

## 2023-06-06 LAB — CBC
HCT: 32.5 % — ABNORMAL LOW (ref 36.0–46.0)
Hemoglobin: 9.4 g/dL — ABNORMAL LOW (ref 12.0–15.0)
MCH: 22.2 pg — ABNORMAL LOW (ref 26.0–34.0)
MCHC: 28.9 g/dL — ABNORMAL LOW (ref 30.0–36.0)
MCV: 76.7 fL — ABNORMAL LOW (ref 80.0–100.0)
Platelets: 165 10*3/uL (ref 150–400)
RBC: 4.24 MIL/uL (ref 3.87–5.11)
RDW: 16.9 % — ABNORMAL HIGH (ref 11.5–15.5)
WBC: 5 10*3/uL (ref 4.0–10.5)
nRBC: 0 % (ref 0.0–0.2)

## 2023-06-06 LAB — COMPREHENSIVE METABOLIC PANEL
ALT: 13 U/L (ref 0–44)
AST: 22 U/L (ref 15–41)
Albumin: 3.4 g/dL — ABNORMAL LOW (ref 3.5–5.0)
Alkaline Phosphatase: 50 U/L (ref 38–126)
Anion gap: 8 (ref 5–15)
BUN: 10 mg/dL (ref 8–23)
CO2: 27 mmol/L (ref 22–32)
Calcium: 8.6 mg/dL — ABNORMAL LOW (ref 8.9–10.3)
Chloride: 106 mmol/L (ref 98–111)
Creatinine, Ser: 0.74 mg/dL (ref 0.44–1.00)
GFR, Estimated: >60 mL/min (ref >60)
Glucose, Bld: 117 mg/dL — ABNORMAL HIGH (ref 70–99)
Potassium: 3.4 mmol/L — ABNORMAL LOW (ref 3.5–5.1)
Sodium: 141 mmol/L (ref 135–145)
Total Bilirubin: 0.7 mg/dL (ref 0.0–1.2)
Total Protein: 6.6 g/dL (ref 6.5–8.1)

## 2023-06-06 LAB — RESP PANEL BY RT-PCR (RSV, FLU A&B, COVID)  RVPGX2
Influenza A by PCR: NEGATIVE
Influenza B by PCR: NEGATIVE
Resp Syncytial Virus by PCR: POSITIVE — AB
SARS Coronavirus 2 by RT PCR: NEGATIVE

## 2023-06-06 LAB — BRAIN NATRIURETIC PEPTIDE: B Natriuretic Peptide: 195.9 pg/mL — ABNORMAL HIGH (ref 0.0–100.0)

## 2023-06-06 MED ORDER — DONEPEZIL HCL 10 MG PO TABS
10.0000 mg | ORAL_TABLET | Freq: Every day | ORAL | Status: DC
  Administered 2023-06-06 – 2023-06-11 (×6): 10 mg via ORAL
  Filled 2023-06-06 (×6): qty 1

## 2023-06-06 MED ORDER — ACETAMINOPHEN 325 MG PO TABS
650.0000 mg | ORAL_TABLET | Freq: Four times a day (QID) | ORAL | Status: DC | PRN
  Administered 2023-06-07 – 2023-06-08 (×2): 650 mg via ORAL
  Filled 2023-06-06 (×2): qty 2

## 2023-06-06 MED ORDER — GUAIFENESIN ER 600 MG PO TB12
600.0000 mg | ORAL_TABLET | Freq: Two times a day (BID) | ORAL | Status: DC
Start: 2023-06-06 — End: 2023-06-12
  Administered 2023-06-06 – 2023-06-12 (×12): 600 mg via ORAL
  Filled 2023-06-06 (×12): qty 1

## 2023-06-06 MED ORDER — SENNOSIDES-DOCUSATE SODIUM 8.6-50 MG PO TABS: 1.0000 | ORAL_TABLET | Freq: Every evening | ORAL | Status: DC | PRN

## 2023-06-06 MED ORDER — ACETAMINOPHEN 325 MG PO TABS
650.0000 mg | ORAL_TABLET | Freq: Once | ORAL | Status: AC
  Administered 2023-06-06: 650 mg via ORAL
  Filled 2023-06-06: qty 2

## 2023-06-06 MED ORDER — ZIPRASIDONE MESYLATE 20 MG IM SOLR
20.0000 mg | Freq: Once | INTRAMUSCULAR | Status: AC | PRN
  Administered 2023-06-06: 20 mg via INTRAMUSCULAR
  Filled 2023-06-06: qty 20

## 2023-06-06 MED ORDER — ONDANSETRON HCL 4 MG PO TABS: 4.0000 mg | ORAL_TABLET | Freq: Four times a day (QID) | ORAL | Status: DC | PRN

## 2023-06-06 MED ORDER — PANTOPRAZOLE SODIUM 40 MG PO TBEC
40.0000 mg | DELAYED_RELEASE_TABLET | Freq: Two times a day (BID) | ORAL | Status: DC
  Administered 2023-06-06 – 2023-06-12 (×12): 40 mg via ORAL
  Filled 2023-06-06 (×13): qty 1

## 2023-06-06 MED ORDER — HALOPERIDOL LACTATE 5 MG/ML IJ SOLN
INTRAMUSCULAR | Status: AC
  Administered 2023-06-06: 5 mg via INTRAVENOUS
  Filled 2023-06-06: qty 1

## 2023-06-06 MED ORDER — GABAPENTIN 100 MG PO CAPS
200.0000 mg | ORAL_CAPSULE | Freq: Two times a day (BID) | ORAL | Status: DC
  Administered 2023-06-06 – 2023-06-12 (×12): 200 mg via ORAL
  Filled 2023-06-06 (×12): qty 2

## 2023-06-06 MED ORDER — ONDANSETRON HCL 4 MG/2ML IJ SOLN: 4.0000 mg | Freq: Four times a day (QID) | INTRAMUSCULAR | Status: DC | PRN

## 2023-06-06 MED ORDER — IPRATROPIUM-ALBUTEROL 0.5-2.5 (3) MG/3ML IN SOLN
3.0000 mL | Freq: Four times a day (QID) | RESPIRATORY_TRACT | Status: DC | PRN
  Administered 2023-06-06 – 2023-06-11 (×5): 3 mL via RESPIRATORY_TRACT
  Filled 2023-06-06 (×6): qty 3

## 2023-06-06 MED ORDER — DILTIAZEM HCL-DEXTROSE 125-5 MG/125ML-% IV SOLN (PREMIX)
5.0000 mg/h | INTRAVENOUS | Status: DC
  Administered 2023-06-06: 5 mg/h via INTRAVENOUS
  Filled 2023-06-06: qty 125

## 2023-06-06 MED ORDER — ENOXAPARIN SODIUM 40 MG/0.4ML IJ SOSY
40.0000 mg | PREFILLED_SYRINGE | INTRAMUSCULAR | Status: DC
  Administered 2023-06-07 – 2023-06-12 (×6): 40 mg via SUBCUTANEOUS
  Filled 2023-06-06 (×6): qty 0.4

## 2023-06-06 MED ORDER — ROSUVASTATIN CALCIUM 20 MG PO TABS
40.0000 mg | ORAL_TABLET | Freq: Every day | ORAL | Status: DC
  Administered 2023-06-07 – 2023-06-12 (×6): 40 mg via ORAL
  Filled 2023-06-06 (×6): qty 2

## 2023-06-06 MED ORDER — SERTRALINE HCL 100 MG PO TABS
200.0000 mg | ORAL_TABLET | Freq: Every morning | ORAL | Status: DC
Start: 2023-06-07 — End: 2023-06-12
  Administered 2023-06-07 – 2023-06-12 (×6): 200 mg via ORAL
  Filled 2023-06-06 (×6): qty 2

## 2023-06-06 MED ORDER — STERILE WATER FOR INJECTION IJ SOLN
INTRAMUSCULAR | Status: AC
  Administered 2023-06-06: 10 mL
  Filled 2023-06-06: qty 10

## 2023-06-06 MED ORDER — ARFORMOTEROL TARTRATE 15 MCG/2ML IN NEBU
15.0000 ug | INHALATION_SOLUTION | Freq: Two times a day (BID) | RESPIRATORY_TRACT | Status: DC
  Administered 2023-06-06 – 2023-06-12 (×12): 15 ug via RESPIRATORY_TRACT
  Filled 2023-06-06 (×12): qty 2

## 2023-06-06 MED ORDER — METOPROLOL TARTRATE 5 MG/5ML IV SOLN
5.0000 mg | Freq: Four times a day (QID) | INTRAVENOUS | Status: DC | PRN
  Administered 2023-06-06: 5 mg via INTRAVENOUS
  Filled 2023-06-06: qty 5

## 2023-06-06 MED ORDER — SODIUM CHLORIDE 0.9% FLUSH
3.0000 mL | Freq: Two times a day (BID) | INTRAVENOUS | Status: DC
  Administered 2023-06-06 – 2023-06-12 (×11): 3 mL via INTRAVENOUS

## 2023-06-06 MED ORDER — POLYETHYLENE GLYCOL 3350 17 G PO PACK
17.0000 g | PACK | Freq: Every day | ORAL | Status: DC
  Administered 2023-06-07 – 2023-06-12 (×5): 17 g via ORAL
  Filled 2023-06-06 (×6): qty 1

## 2023-06-06 MED ORDER — ACETAMINOPHEN 650 MG RE SUPP: 650.0000 mg | Freq: Four times a day (QID) | RECTAL | Status: DC | PRN

## 2023-06-06 MED ORDER — POTASSIUM CHLORIDE 20 MEQ PO PACK
40.0000 meq | PACK | Freq: Once | ORAL | Status: AC
  Administered 2023-06-06: 40 meq via ORAL
  Filled 2023-06-06: qty 2

## 2023-06-06 MED ORDER — METHYLPREDNISOLONE SODIUM SUCC 40 MG IJ SOLR
40.0000 mg | Freq: Two times a day (BID) | INTRAMUSCULAR | Status: DC
  Administered 2023-06-06 – 2023-06-08 (×4): 40 mg via INTRAVENOUS
  Filled 2023-06-06 (×4): qty 1

## 2023-06-06 MED ORDER — HALOPERIDOL LACTATE 5 MG/ML IJ SOLN: 5.0000 mg | Freq: Once | INTRAMUSCULAR | Status: AC

## 2023-06-06 MED ORDER — BUDESONIDE 0.25 MG/2ML IN SUSP
0.2500 mg | Freq: Two times a day (BID) | RESPIRATORY_TRACT | Status: DC
  Administered 2023-06-06 – 2023-06-12 (×11): 0.25 mg via RESPIRATORY_TRACT
  Filled 2023-06-06 (×11): qty 2
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
## 2023-06-06 NOTE — ED Triage Notes (Signed)
Pt bib ems from [facility] for SOB, productive cough. Pt has hx of CHF, no longer taking lasix. Rales and exp wheezing noted with ems.,  90% on RA 98% on 4L [place] Pt alrt, oriented to self only at baseline.

## 2023-06-06 NOTE — Progress Notes (Signed)
TRH night cross cover note:  I was notified by RN that this patient is confused, agitated, pulling at their telemetry leads and removed her peripheral IV, with these behaviors refractory to attempts at verbal redirection.  In the setting of associated interference with ongoing medical treatment posing potential harm to themself, I have placed orders for a scheduled dose of haldol 5 mg iv x 1 now, and also ordered a one-time prn dose of IM geodon for ensuing/refractory agitation.     Newton Pigg, DO Hospitalist

## 2023-06-06 NOTE — Hospital Course (Signed)
Angelica Kelley is a 78 y.o. female with medical history significant for dementia, PAF not on anticoagulation, HTN, HLD, moderate aortic stenosis, depression/anxiety who is admitted with acute hypoxic respiratory failure due to RSV associated bronchospasm.

## 2023-06-06 NOTE — Progress Notes (Signed)
Confused and agitated. Geodon given

## 2023-06-06 NOTE — Progress Notes (Signed)
TRH night cross cover note:   I was notified by RN that this patient, with known known history of paroxysmal atrial fibrillation, who was in atrial fibrillation with variable heart rate at the time of admission, had increasing HR's towards the beginning of night shift, refractory to dose of prn iv lopressor per existing order. BP stable, without e/o of hypotension. Assessment of acute associated symptoms is complicated by the patient's agitation superimposed on dementia.   I subsequently ordered diltiazem drip, in the absence of preceding bolus given recent dose of IV Lopressor, and updated the patient's level of care from med/tele to progressive care given aforementioned initiation of diltiazem drip.     Newton Pigg, DO Hospitalist

## 2023-06-06 NOTE — Progress Notes (Signed)
During bedside rounds, patient out pf bed, dyspneic, blood every where appears to have pull out IV, oxygen taken off, tachycardic heart rate 145, sats 82, tates she's going home. Placed back on oxygen and breathing treatments administered. Contacted on call coverage.

## 2023-06-06 NOTE — Progress Notes (Signed)
Very restless, BP 174/108, expiratory wheezing, dicussed with RT.Marland Kitchen Heart rate 175,  afib rvr. Non sustained, metoprolol given . Discussed with on call MD. Will start drip if heart rate within parameter. Current heart rate 84 afib BP now 158/87

## 2023-06-06 NOTE — ED Notes (Signed)
Patient switched to room air at provider request, patient tolerated room air with no increase in respiratory effort and no desat. I then walked patient on room air at provider request, Patient ambulated to bathroom then up and down hall, with increase in work of breathing noted. Patient's O2 dropped to 87%, and heart rate jumped to 130s.

## 2023-06-06 NOTE — ED Notes (Signed)
Patient noted to be confused with history of dementia, she had removed all monitoring equipment and walked to nurses station. Patient advised she needed to get back to bed, due to exertional dyspnea with O2 sat drops.

## 2023-06-06 NOTE — Progress Notes (Signed)
Patient level of care changed PCU. Informed charge RN

## 2023-06-06 NOTE — ED Provider Notes (Signed)
[place] EMERGENCY DEPARTMENT AT [facility] Provider Note   CSN: [id number] Arrival date & time: 06/06/23  1112     History Chief Complaint  Patient presents with   Shortness of Breath   Cough    Angelica Kelley is a 78 y.o. female.  Patient with past history significant for CKD, chronic diastolic heart failure, hypertension, hyperlipidemia, dementia here with concerns of shortness of breath and cough.  Patient alert and oriented to self at baseline and no acute change.  Patient brought by EMS from [facility] for concerns of shortness of breath and productive coughing.  She does also have a history of CHF and not on Lasix anymore.  Patient noted to be 90% on room air but improved to 98% on 4 L nasal cannula.  Patient does not require oxygen use at home.   Shortness of Breath Associated symptoms: cough   Cough Associated symptoms: shortness of breath        Home Medications Prior to Admission medications   Medication Sig Start Date End Date Taking? Authorizing Provider  acetaminophen (TYLENOL) 325 MG tablet Take 650 mg by mouth every 6 (six) hours as needed for moderate pain.   Yes [provider]  ARTIFICIAL TEARS 0.1-0.3 % SOLN Place 1 drop into both eyes every 12 (twelve) hours as needed (for dryness).   Yes [provider]  BIOFREEZE 4 % GEL Apply 1 application topically See admin instructions. Apply to the left shoulders 2 times a day   Yes [provider]  cholecalciferol (VITAMIN D) 25 MCG (1000 UT) tablet Take 1,000 Units by mouth daily.   Yes [provider]  clonazePAM (KLONOPIN) 0.5 MG tablet Take 1 tablet (0.5 mg total) by mouth daily as needed for anxiety. 10/20/20  Yes Ghimire, Werner Lean, MD  diphenhydrAMINE (BENADRYL) 25 MG tablet Take 25 mg by mouth daily as needed for itching.   Yes [provider]  donepezil (ARICEPT) 10 MG tablet Take 10 mg by mouth at bedtime.   Yes [provider]   gabapentin (NEURONTIN) 100 MG capsule Take 200 mg by mouth 2 (two) times daily.   Yes [provider]  levocetirizine (XYZAL) 5 MG tablet Take 5 mg by mouth in the morning.   Yes [provider]  loperamide (IMODIUM) 2 MG capsule Take 2 mg by mouth as needed for diarrhea or loose stools. 02/09/23  Yes [provider]  magnesium citrate SOLN Take 150 mLs by mouth daily as needed (for contipation).   Yes [provider]  Menthol, Topical Analgesic, 4 % GEL Apply 1 application  topically 2 (two) times a day. Left shoulder pain   Yes [provider]  pantoprazole (PROTONIX) 40 MG tablet Take 1 tablet (40 mg total) by mouth 2 (two) times daily. 01/21/19 06/06/23 Yes Regalado, [name], MD  polyethylene glycol (MIRALAX / GLYCOLAX) 17 g packet Take 17 g by mouth daily. 10/20/20  Yes Ghimire, Werner Lean, MD  rosuvastatin (CRESTOR) 40 MG tablet Take 40 mg by mouth daily.   Yes [provider]  sennosides-docusate sodium (SENOKOT-S) 8.6-50 MG tablet Take 1 tablet by mouth daily.   Yes [provider]  sertraline (ZOLOFT) 100 MG tablet Take 200 mg by mouth in the morning. 04/11/15  Yes [provider]  thiamine (VITAMIN B-1) 100 MG tablet Take 250 mg by mouth 2 (two) times a day.   Yes [provider]  zolpidem (AMBIEN) 5 MG tablet Take  5 mg by mouth at bedtime.   Yes [provider]      Allergies    Lipitor [atorvastatin] and Prednisone    Review of Systems   Review of Systems  Respiratory:  Positive for cough and shortness of breath.   All other systems reviewed and are negative.   Physical Exam Updated Vital Signs BP (!) 147/94   Pulse 94   Temp 99.6 F (37.6 C) (Oral)   Resp (!) 22   SpO2 95%  Physical Exam Vitals and nursing note reviewed.  Constitutional:      General: She is not in acute distress.    Appearance: She is well-developed.  HENT:     Head: Normocephalic and atraumatic.  Eyes:      Conjunctiva/sclera: Conjunctivae normal.  Cardiovascular:     Rate and Rhythm: Normal rate and regular rhythm.     Heart sounds: No murmur heard. Pulmonary:     Effort: Pulmonary effort is normal. No respiratory distress.     Breath sounds: Examination of the right-upper field reveals wheezing and rales. Examination of the left-upper field reveals wheezing and rales. Examination of the right-middle field reveals rales. Examination of the left-middle field reveals rales. Examination of the right-lower field reveals rales. Examination of the left-lower field reveals rales. Wheezing and rales present.     Comments: Increased WOB with attempted ambulation. Abdominal:     Palpations: Abdomen is soft.     Tenderness: There is no abdominal tenderness.  Musculoskeletal:        General: No swelling.     Cervical back: Neck supple.  Skin:    General: Skin is warm and dry.     Capillary Refill: Capillary refill takes less than 2 seconds.  Neurological:     Mental Status: She is alert.  Psychiatric:        Mood and Affect: Mood normal.     ED Results / Procedures / Treatments   Labs (all labs ordered are listed, but only abnormal results are displayed) Labs Reviewed  RESP PANEL BY RT-PCR (RSV, FLU A&B, COVID)  [id number] - Abnormal; Notable for the following components:      Result Value   Resp Syncytial Virus by PCR POSITIVE (*)    All other components within normal limits  CBC - Abnormal; Notable for the following components:   Hemoglobin 9.4 (*)    HCT 32.5 (*)    MCV 76.7 (*)    MCH 22.2 (*)    MCHC 28.9 (*)    RDW 16.9 (*)    All other components within normal limits  COMPREHENSIVE METABOLIC PANEL - Abnormal; Notable for the following components:   Potassium 3.4 (*)    Glucose, Bld 117 (*)    Calcium 8.6 (*)    Albumin 3.4 (*)    All other components within normal limits  BRAIN NATRIURETIC PEPTIDE - Abnormal; Notable for the following components:   B Natriuretic Peptide 195.9 (*)     All other components within normal limits    EKG None  Radiology DG Chest Portable 1 View Result Date: 06/06/2023 CLINICAL DATA:  78 year old female with shortness of breath, productive cough. Wheezing. EXAM: PORTABLE CHEST 1 VIEW COMPARISON:  Chest radiographs 10/18/2020 and earlier. FINDINGS: Portable AP upright view at 1225 hours. Mildly improved lung volumes compared to prior. Chronic elevation of the right hemidiaphragm is mild and stable. Calcified aortic atherosclerosis. Other mediastinal contours are within normal limits. Coarse bilateral chronic pulmonary interstitial opacity, not  significantly changed from 2020. Bronchiectasis at the lung bases demonstrated on a 2022 CT. No pneumothorax, pulmonary edema, pleural effusion or confluent lung opacity. No acute osseous abnormality identified. IMPRESSION: Chronic lung disease with interstitial changes on prior CT. No acute cardiopulmonary abnormality. Electronically Signed   By: Odessa Fleming M.D.   On: 06/06/2023 12:54    Procedures Procedures    Medications Ordered in ED Medications  acetaminophen (TYLENOL) tablet 650 mg (650 mg Oral Given 06/06/23 1341)    ED Course/ Medical Decision Making/ A&P                                 Medical Decision Making Amount and/or Complexity of Data Reviewed Labs: ordered. Radiology: ordered.  Risk OTC drugs. Decision regarding hospitalization.   This patient presents to the ED for concern of shortness of breath, cough.  Differential diagnosis includes COVID-19, pneumonia, CHF exacerbation, ACS, PE   Lab Tests:  I Ordered, and personally interpreted labs.  The pertinent results include: CBC with baseline anemia, CMP unremarkable, BNP slightly elevated at 195, respiratory panel positive for RSV   Imaging Studies ordered:  I ordered imaging studies including chest x-ray I independently visualized and interpreted imaging which showed Chronic lung disease with interstitial changes on prior  CT. No acute cardiopulmonary abnormality. I agree with the radiologist interpretation   Medicines ordered and prescription drug management:  I ordered medication including Tylenol for low-grade fever Reevaluation of the patient after these medicines showed that the patient improved I have reviewed the patients home medicines and have made adjustments as needed   Problem List / ED Course:  Patient with past history significant for CKD, chronic diastolic heart failure, dementia, hypertension, hyperlipidemia presents to the ED with concerns of shortness of breath and cough.  Patient was reportedly found at assisted living home this morning in bed feeling extremely fatigued with a worsening cough that started yesterday.  Was advised by provider to have patient brought into the emergency department given notable hypoxia at 87% on room air.  Patient does not wear any oxygen at baseline. Exam reveals significant lung sounds in all lung fields with combination of rales and wheezing present.  Mild 1+ edema bilateral lower extremities.  Patient currently wearing oxygen and is taking saturation at 98 to 100%. Lab workup reveals that patient is positive for RSV.  No acute CHF exacerbation.  No other acute concerning findings.  Will trial potential ambulation without oxygen to see if patient can maintain oxygen saturation and if not, consult for admission. Spoke with [facility] and niece who confirm patient wear no O2 at baseline. Had patient tried on RA with no desaturation of O2. On ambulation, patient desaturated to 87% and had notable increased work of breathing. Given inability to maintain O2 on RA, will consult with hospitalis for admission. Spoke with Dr. Allena Katz, hospitalist, who will evaluate patient for admission.   Social Determinants of Health:  Resident at assisted living   Final Clinical Impression(s) / ED Diagnoses Final diagnoses:  RSV (respiratory syncytial virus  infection)  Acute respiratory failure with hypoxia [facility])    Rx / DC Orders ED Discharge Orders     None         Smitty Knudsen, PA-C 06/06/23 1414    Maia Plan, MD 06/07/23 1001

## 2023-06-07 DIAGNOSIS — F0280 Dementia in other diseases classified elsewhere without behavioral disturbance: Secondary | ICD-10-CM

## 2023-06-07 DIAGNOSIS — G300 Alzheimer's disease with early onset: Secondary | ICD-10-CM

## 2023-06-07 DIAGNOSIS — I480 Paroxysmal atrial fibrillation: Secondary | ICD-10-CM

## 2023-06-07 DIAGNOSIS — J210 Acute bronchiolitis due to respiratory syncytial virus: Secondary | ICD-10-CM | POA: Diagnosis not present

## 2023-06-07 DIAGNOSIS — J9601 Acute respiratory failure with hypoxia: Secondary | ICD-10-CM

## 2023-06-07 LAB — BASIC METABOLIC PANEL
Anion gap: 9 (ref 5–15)
BUN: 11 mg/dL (ref 8–23)
CO2: 27 mmol/L (ref 22–32)
Calcium: 8.6 mg/dL — ABNORMAL LOW (ref 8.9–10.3)
Chloride: 107 mmol/L (ref 98–111)
Creatinine, Ser: 0.7 mg/dL (ref 0.44–1.00)
GFR, Estimated: >60 mL/min (ref >60)
Glucose, Bld: 125 mg/dL — ABNORMAL HIGH (ref 70–99)
Potassium: 4 mmol/L (ref 3.5–5.1)
Sodium: 143 mmol/L (ref 135–145)

## 2023-06-07 LAB — CBC
HCT: 31.2 % — ABNORMAL LOW (ref 36.0–46.0)
Hemoglobin: 9.1 g/dL — ABNORMAL LOW (ref 12.0–15.0)
MCH: 22.1 pg — ABNORMAL LOW (ref 26.0–34.0)
MCHC: 29.2 g/dL — ABNORMAL LOW (ref 30.0–36.0)
MCV: 75.9 fL — ABNORMAL LOW (ref 80.0–100.0)
Platelets: 150 10*3/uL (ref 150–400)
RBC: 4.11 MIL/uL (ref 3.87–5.11)
RDW: 17 % — ABNORMAL HIGH (ref 11.5–15.5)
WBC: 4.4 10*3/uL (ref 4.0–10.5)
nRBC: 0 % (ref 0.0–0.2)

## 2023-06-07 LAB — FERRITIN: Ferritin: 10 ng/mL — ABNORMAL LOW (ref 11–307)

## 2023-06-07 LAB — IRON AND TIBC
Iron: 21 ug/dL — ABNORMAL LOW (ref 28–170)
Saturation Ratios: 5 % — ABNORMAL LOW (ref 10.4–31.8)
TIBC: 428 ug/dL (ref 250–450)
UIBC: 407 ug/dL

## 2023-06-07 LAB — VITAMIN B12: Vitamin B-12: 2235 pg/mL — ABNORMAL HIGH (ref 180–914)

## 2023-06-07 LAB — FOLATE: Folate: 10.5 ng/mL (ref >5.9)

## 2023-06-07 MED ORDER — LEVALBUTEROL HCL 0.63 MG/3ML IN NEBU
0.6300 mg | INHALATION_SOLUTION | Freq: Three times a day (TID) | RESPIRATORY_TRACT | Status: DC
  Administered 2023-06-07 – 2023-06-12 (×16): 0.63 mg via RESPIRATORY_TRACT
  Filled 2023-06-07 (×16): qty 3

## 2023-06-07 MED ORDER — THIAMINE MONONITRATE 100 MG PO TABS
100.0000 mg | ORAL_TABLET | Freq: Every day | ORAL | Status: DC
  Administered 2023-06-07 – 2023-06-12 (×6): 100 mg via ORAL
  Filled 2023-06-07 (×6): qty 1

## 2023-06-07 MED ORDER — MELATONIN 3 MG PO TABS
3.0000 mg | ORAL_TABLET | Freq: Every evening | ORAL | Status: DC | PRN
  Administered 2023-06-07 – 2023-06-11 (×3): 3 mg via ORAL
  Filled 2023-06-07 (×3): qty 1

## 2023-06-07 MED ORDER — FUROSEMIDE 10 MG/ML IJ SOLN
20.0000 mg | Freq: Once | INTRAMUSCULAR | Status: AC
  Administered 2023-06-07: 20 mg via INTRAVENOUS
  Filled 2023-06-07: qty 2

## 2023-06-07 MED ORDER — FERROUS SULFATE 325 (65 FE) MG PO TABS
325.0000 mg | ORAL_TABLET | Freq: Every day | ORAL | Status: DC
  Administered 2023-06-07 – 2023-06-12 (×6): 325 mg via ORAL
  Filled 2023-06-07 (×6): qty 1

## 2023-06-07 MED ORDER — SENNOSIDES-DOCUSATE SODIUM 8.6-50 MG PO TABS
1.0000 | ORAL_TABLET | Freq: Every day | ORAL | Status: DC
  Administered 2023-06-07 – 2023-06-11 (×5): 1 via ORAL
  Filled 2023-06-07 (×5): qty 1

## 2023-06-07 MED ORDER — LEVOCETIRIZINE DIHYDROCHLORIDE 5 MG PO TABS: 5.0000 mg | ORAL_TABLET | Freq: Every morning | ORAL | Status: DC

## 2023-06-07 MED ORDER — VITAMIN D 25 MCG (1000 UNIT) PO TABS
1000.0000 [IU] | ORAL_TABLET | Freq: Every day | ORAL | Status: DC
  Administered 2023-06-07 – 2023-06-12 (×6): 1000 [IU] via ORAL
  Filled 2023-06-07 (×6): qty 1

## 2023-06-07 MED ORDER — LORATADINE 10 MG PO TABS
10.0000 mg | ORAL_TABLET | Freq: Every day | ORAL | Status: DC
  Administered 2023-06-07 – 2023-06-12 (×6): 10 mg via ORAL
  Filled 2023-06-07 (×6): qty 1

## 2023-06-07 MED ORDER — SENNA-DOCUSATE SODIUM 8.6-50 MG PO TABS: 1.0000 | ORAL_TABLET | Freq: Every day | ORAL | Status: DC

## 2023-06-07 MED ORDER — IPRATROPIUM BROMIDE 0.02 % IN SOLN
0.5000 mg | Freq: Three times a day (TID) | RESPIRATORY_TRACT | Status: DC
  Administered 2023-06-07 – 2023-06-12 (×16): 0.5 mg via RESPIRATORY_TRACT
  Filled 2023-06-07 (×16): qty 2.5

## 2023-06-07 NOTE — Progress Notes (Signed)
TRH night cross cover note:   I was notified by RN of the patient's request for a sleep aid. I subsequently placed order for prn melatonin for insomnia.     Newton Pigg, DO Hospitalist

## 2023-06-07 NOTE — TOC Initial Note (Addendum)
Transition of Care [facility]) - Initial/Assessment Note    Patient Details  Name: Angelica Kelley MRN: [id number] Date of Birth: 05-Jan-1946  [facility] CM/SW Contact:    Mearl Latin, LCSW Phone Number: 06/07/2023, 5:46 PM  Clinical Narrative:                 Patient admitted from [facility] ALF [phone]). CSW spoke with the [facility] Lupita Leash there and she confirmed patient ambulated without DME and no oxygen at baseline with a regular diet. She requested CSW fax the DC summary to f. [phone] with home health orders on it and they will contact their therapy agency. Lupita Leash stated she is aware of RS diagnosis. Will continue to follow.   Expected Discharge Plan: Assisted Living Barriers to Discharge: Continued Medical Work up   Patient Goals and CMS Choice Patient states their goals for this hospitalization and ongoing recovery are:: Return to ALF          Expected Discharge Plan and Services In-house Referral: Clinical Social Work   Post Acute Care Choice: Home Health Living arrangements for the past 2 months: Assisted Living Facility                                      Prior Living Arrangements/Services Living arrangements for the past 2 months: Assisted Living Facility Lives with:: Facility Resident Patient language and need for interpreter reviewed:: Yes Do you feel safe going back to the place where you live?: Yes      Need for Family Participation in Patient Care: Yes (Comment) Care giver support system in place?: Yes (comment)   Criminal Activity/Legal Involvement Pertinent to Current Situation/Hospitalization: No - Comment as needed  Activities of Daily Living      Permission Sought/Granted Permission sought to share information with : Facility Medical sales representative, Family Supports Permission granted to share information with : No  Share Information with NAME: Dimas Aguas  Permission granted to share info w AGENCY: [facility]  Permission  granted to share info w Relationship: Brother  Permission granted to share info w Contact Information: [phone]  Emotional Assessment Appearance:: Appears stated age Attitude/Demeanor/Rapport: Unable to Assess Affect (typically observed): Unable to Assess Orientation: : Oriented to Self Alcohol / Substance Use: Not Applicable Psych Involvement: No (comment)  Admission diagnosis:  Acute bronchiolitis due to respiratory syncytial virus (RSV) [J21.0] RSV (respiratory syncytial virus infection) [B33.8] Acute respiratory failure with hypoxia (HCC) [J96.01] Patient Active Problem List   Diagnosis Date Noted   Acute bronchiolitis due to respiratory syncytial virus (RSV) 06/06/2023   Acute respiratory failure with hypoxia (HCC) 06/06/2023   Diarrhea due to laxative abuse 10/18/2020   Dehydration 10/18/2020   Diarrhea of presumed infectious origin    Duodenal ulcer    Acute blood loss anemia 01/11/2019   Syncope 10/12/2017   Benzodiazepine overdose 09/07/2017   Dementia (HCC) 09/07/2017   Paroxysmal atrial fibrillation (HCC) 09/07/2017   Chronic diastolic heart failure (HCC) 09/07/2017   Microcytic anemia 09/07/2017   AKI (acute kidney injury) (HCC) 08/19/2017   Hypokalemia 08/19/2017   Depression with anxiety 05/09/2017   Fall 05/09/2017   Slurred speech 05/09/2017   CKD (chronic kidney disease) stage 2, GFR 60-89 ml/min 04/23/2017   OAB (overactive bladder) 06/09/2014   Vitamin D deficiency 10/27/2013   Medication management 10/27/2013   Hypertension    Hyperlipidemia    GERD (gastroesophageal reflux disease)  Bipolar depression (HCC)    Alcoholism (HCC)    IBS (irritable bowel syndrome)    Other abnormal glucose    PCP:  [facility]:   [facility] - [place], [place] - [address] [place] [address] Phone: [phone] Fax: [phone]     Social Drivers of Health (SDOH) Social  History: SDOH Screenings   Tobacco Use: Medium Risk (06/06/2023)   SDOH Interventions:     Readmission Risk Interventions     No data to display

## 2023-06-07 NOTE — Plan of Care (Signed)
Problem: Education: Goal: Knowledge of General Education information will improve Description: Including pain rating scale, medication(s)/side effects and non-pharmacologic comfort measures Outcome: Progressing   Problem: Clinical Measurements: Goal: Will remain free from infection Outcome: Progressing Goal: Diagnostic test results will improve Outcome: Progressing

## 2023-06-07 NOTE — Progress Notes (Addendum)
PROGRESS NOTE        PATIENT DETAILS Name: Angelica Kelley Age: 78 y.o. Sex: female Date of Birth: [birth year] Admit Date: 06/06/2023 Admitting Physician Angie Fava, DO QMV:[id number], [facility]  Brief Summary: Patient is a 78 y.o.  female with history of dementia, HTN, HLD, aortic stenosis, PAF not on anticoagulation, depression/anxiety-who presented with shortness of breath-found to have acute hypoxic respiratory failure due to RSV bronchitis.  Significant events: 1/23>> to ED from ALF-SOB/hypoxic-RSV+ve  Significant studies: 1/23>> CXR: No acute findings-chronic interstitial changes.  Significant microbiology data: 1/23>> RSV PCR: Positive 1/23>> COVID/influenza PCR: Negative  Procedures: None  Consults: None  Subjective: Sleeping but on 3 L of oxygen-still with significant wheeze all over.  Objective: Vitals: Blood pressure (!) 110/47, pulse 73, temperature 98.2 F (36.8 C), temperature source Oral, resp. rate 20, weight 77.9 kg, SpO2 98%.   Exam: Gen Exam:Alert awake-not in any distress HEENT:atraumatic, normocephalic Chest: Moving air well-but with rhonchi all over CVS:S1S2 regular Abdomen:soft non tender, non distended Extremities:no edema Neurology: Non focal Skin: no rash  Pertinent Labs/Radiology:    Latest Ref Rng & Units 06/07/2023    5:05 AM 06/06/2023   11:23 AM 10/20/2020    2:39 AM  CBC  WBC 4.0 - 10.5 K/uL 4.4  5.0  11.5   Hemoglobin 12.0 - 15.0 g/dL 9.1  9.4  84.1   Hematocrit 36.0 - 46.0 % 31.2  32.5  38.0   Platelets 150 - 400 K/uL 150  165  129     Lab Results  Component Value Date   NA 143 06/07/2023   K 4.0 06/07/2023   CL 107 06/07/2023   CO2 27 06/07/2023     Assessment/Plan: Acute hypoxic respiratory failure due to RSV bronchitis Still wheezing-but comfortable Continue steroids/bronchodilators/mucolytic's Although no obvious signs of volume overload-will give 1 dose of IV Lasix to ensure  negative balance. Incentive spirometry/flutter valve-if able to follow directions Out of bed to chair-mobilize with PT/OT Titrate down FiO2 as tolerated-currently on 3 L   PAF with intermittent RVR Rate controlled-will attempt to discontinue Cardizem infusion (not on any rate control agents at home) Per prior documentation-not on chronic anticoagulation due to history of fall/GI bleed/subdural hematoma As needed metoprolol.  Microcytic anemia Hb not far from prior baseline No evidence of GI bleeding Iron panel consistent with iron deficiency Placed on oral iron supplementation  HLD Statin  Constipation predominant IBS Resume laxatives  Dementia At risk for delirium Maintain delirium precautions  Depression/anxiety Zoloft As needed Klonopin  BMI: Estimated body mass index is 26.74 kg/m as calculated from the following:   Height as of 10/18/20: 5' 7.2" (1.707 m).   Weight as of this encounter: 77.9 kg.   Code status:   Code Status: Limited: Do not attempt resuscitation (DNR) -DNR-LIMITED -Do Not Intubate/DNI -this was confirmed with niece Almyra Free over the phone by this MD.  No intubation no CPR but otherwise continue to treat with antibiotics/steroids etc.  DVT Prophylaxis: enoxaparin (LOVENOX) injection 40 mg Start: 06/07/23 1000   Family Communication: Niece-[name] [name]-[phone] -updated 1/24   Disposition Plan: Status is: Inpatient Remains inpatient appropriate because: Severity of illness   Planned Discharge Destination:Assisted living   Diet: Diet Order             Diet Heart Fluid consistency: Thin  Diet effective now  Antimicrobial agents: Anti-infectives (From admission, onward)    None        MEDICATIONS: Scheduled Meds:  arformoterol  15 mcg Nebulization BID   budesonide (PULMICORT) nebulizer solution  0.25 mg Nebulization BID   cholecalciferol  1,000 Units Oral Daily   donepezil  10 mg Oral QHS   enoxaparin  (LOVENOX) injection  40 mg Subcutaneous Q24H   [START ON 06/08/2023] ferrous sulfate  325 mg Oral Q breakfast   gabapentin  200 mg Oral BID   guaiFENesin  600 mg Oral BID   levalbuterol  0.63 mg Nebulization TID   And   ipratropium  0.5 mg Nebulization TID   levocetirizine  5 mg Oral q AM   methylPREDNISolone (SOLU-MEDROL) injection  40 mg Intravenous Q12H   pantoprazole  40 mg Oral BID   polyethylene glycol  17 g Oral Daily   rosuvastatin  40 mg Oral Daily   senna-docusate  1 tablet Oral QHS   sertraline  200 mg Oral q AM   sodium chloride flush  3 mL Intravenous Q12H   thiamine  100 mg Oral Daily   Continuous Infusions:   PRN Meds:.acetaminophen **OR** acetaminophen, ipratropium-albuterol, metoprolol tartrate, ondansetron **OR** ondansetron (ZOFRAN) IV   I have personally reviewed following labs and imaging studies  LABORATORY DATA: CBC: Recent Labs  Lab 06/06/23 1123 06/07/23 0505  WBC 5.0 4.4  HGB 9.4* 9.1*  HCT 32.5* 31.2*  MCV 76.7* 75.9*  PLT 165 150    Basic Metabolic Panel: Recent Labs  Lab 06/06/23 1123 06/07/23 0505  NA 141 143  K 3.4* 4.0  CL 106 107  CO2 27 27  GLUCOSE 117* 125*  BUN 10 11  CREATININE 0.74 0.70  CALCIUM 8.6* 8.6*    GFR: CrCl cannot be calculated (Unknown ideal weight.).  Liver Function Tests: Recent Labs  Lab 06/06/23 1123  AST 22  ALT 13  ALKPHOS 50  BILITOT 0.7  PROT 6.6  ALBUMIN 3.4*   No results for input(s): "LIPASE", "AMYLASE" in the last 168 hours. No results for input(s): "AMMONIA" in the last 168 hours.  Coagulation Profile: No results for input(s): "INR", "PROTIME" in the last 168 hours.  Cardiac Enzymes: No results for input(s): "CKTOTAL", "CKMB", "CKMBINDEX", "TROPONINI" in the last 168 hours.  BNP (last 3 results) No results for input(s): "PROBNP" in the last 8760 hours.  Lipid Profile: No results for input(s): "CHOL", "HDL", "LDLCALC", "TRIG", "CHOLHDL", "LDLDIRECT" in the last 72  hours.  Thyroid Function Tests: No results for input(s): "TSH", "T4TOTAL", "FREET4", "T3FREE", "THYROIDAB" in the last 72 hours.  Anemia Panel: Recent Labs    06/07/23 0505  VITAMINB12 2,235*  FOLATE 10.5  FERRITIN 10*  TIBC 428  IRON 21*    Urine analysis:    Component Value Date/Time   COLORURINE AMBER (A) 10/18/2020 1458   APPEARANCEUR HAZY (A) 10/18/2020 1458   LABSPEC 1.023 10/18/2020 1458   PHURINE 5.0 10/18/2020 1458   GLUCOSEU NEGATIVE 10/18/2020 1458   HGBUR NEGATIVE 10/18/2020 1458   BILIRUBINUR NEGATIVE 10/18/2020 1458   KETONESUR 5 (A) 10/18/2020 1458   PROTEINUR NEGATIVE 10/18/2020 1458   UROBILINOGEN 1 06/09/2014 1531   NITRITE NEGATIVE 10/18/2020 1458   LEUKOCYTESUR NEGATIVE 10/18/2020 1458    Sepsis Labs: Lactic Acid, Venous    Component Value Date/Time   LATICACIDVEN 1.6 10/18/2020 1300    MICROBIOLOGY: Recent Results (from the past 240 hours)  Resp panel by RT-PCR (RSV, Flu A&B, Covid)     Status: Abnormal  Collection Time: 06/06/23 12:15 PM  Result Value Ref Range Status   SARS Coronavirus 2 by RT PCR NEGATIVE NEGATIVE Final   Influenza A by PCR NEGATIVE NEGATIVE Final   Influenza B by PCR NEGATIVE NEGATIVE Final    Comment: (NOTE) The Xpert Xpress SARS-CoV-2/FLU/RSV plus assay is intended as an aid in the diagnosis of influenza from Nasopharyngeal swab specimens and should not be used as a sole basis for treatment. Nasal washings and aspirates are unacceptable for Xpert Xpress SARS-CoV-2/FLU/RSV testing.  Fact Sheet for Patients: BloggerCourse.com  Fact Sheet for Healthcare Providers: SeriousBroker.it  This test is not yet approved or cleared by the [place] FDA and has been authorized for detection and/or diagnosis of SARS-CoV-2 by FDA under an Emergency Use Authorization (EUA). This EUA will remain in effect (meaning this test can be used) for the duration of the COVID-19  declaration under Section 564(b)(1) of the Act, 21 U.S.C. section 360bbb-3(b)(1), unless the authorization is terminated or revoked.     Resp Syncytial Virus by PCR POSITIVE (A) NEGATIVE Final    Comment: (NOTE) Fact Sheet for Patients: BloggerCourse.com  Fact Sheet for Healthcare Providers: SeriousBroker.it  This test is not yet approved or cleared by the [place] FDA and has been authorized for detection and/or diagnosis of SARS-CoV-2 by FDA under an Emergency Use Authorization (EUA). This EUA will remain in effect (meaning this test can be used) for the duration of the COVID-19 declaration under Section 564(b)(1) of the Act, 21 U.S.C. section 360bbb-3(b)(1), unless the authorization is terminated or revoked.  Performed at [facility], [address], [place], [place] [address]     RADIOLOGY STUDIES/RESULTS: DG Chest Portable 1 View Result Date: 06/06/2023 CLINICAL DATA:  78 year old female with shortness of breath, productive cough. Wheezing. EXAM: PORTABLE CHEST 1 VIEW COMPARISON:  Chest radiographs 10/18/2020 and earlier. FINDINGS: Portable AP upright view at 1225 hours. Mildly improved lung volumes compared to prior. Chronic elevation of the right hemidiaphragm is mild and stable. Calcified aortic atherosclerosis. Other mediastinal contours are within normal limits. Coarse bilateral chronic pulmonary interstitial opacity, not significantly changed from 2020. Bronchiectasis at the lung bases demonstrated on a 2022 CT. No pneumothorax, pulmonary edema, pleural effusion or confluent lung opacity. No acute osseous abnormality identified. IMPRESSION: Chronic lung disease with interstitial changes on prior CT. No acute cardiopulmonary abnormality. Electronically Signed   By: Odessa Fleming M.D.   On: 06/06/2023 12:54     LOS: 1 day   Jeoffrey Massed, MD  [facility]    To contact the attending provider between 7A-7P or  the covering provider during after hours 7P-7A, please log into the web site [email or web address] and access using universal [place] password for that web site. If you do not have the password, please call the hospital operator.  06/07/2023, 9:16 AM

## 2023-06-07 NOTE — Evaluation (Signed)
Physical Therapy Evaluation Patient Details Name: Angelica Kelley MRN: [id number] DOB: [birth year] Today's Date: 06/07/2023  History of Present Illness  Pt is a 78 yo female presenting to [facility] ED on 06/06/23 for the evaluation of SOB and hypoxia being worked up for RSV. PMH of PAF not on anticoagulation, HTN, HLD, moderate aortic stenosis, depression/anxiety  Clinical Impression  Pt presents to PT with deficits in activity tolerance and cardiopulmonary function. Pt is a poor historian, unable to provide a reliable baseline, but she is able to mobilize with good balance for short distances during this session. PT defers hallway ambulation due to tachycardia with the patient still in afib. Pt will benefit from continued frequent mobilization in an effort to improve activity tolerance and to restore the patient's prior level of function. PT recommends HHPT at the time of return to [place].        If plan is discharge home, recommend the following: A little help with walking and/or transfers;A little help with bathing/dressing/bathroom;Assistance with cooking/housework;Assist for transportation;Help with stairs or ramp for entrance;Supervision due to cognitive status   Can travel by private vehicle        Equipment Recommendations None recommended by PT  Recommendations for Other Services       Functional Status Assessment Patient has had a recent decline in their functional status and demonstrates the ability to make significant improvements in function in a reasonable and predictable amount of time.     Precautions / Restrictions Precautions Precautions: Fall Precaution Comments: RSV Restrictions Weight Bearing Restrictions Per Provider Order: No      Mobility  Bed Mobility               General bed mobility comments: pt up in recliner, bed mobility not assessed    Transfers Overall transfer level: Needs assistance Equipment used: None Transfers: Sit to/from Stand Sit to  Stand: Supervision                Ambulation/Gait Ambulation/Gait assistance: Supervision Gait Distance (Feet): 25 Feet (25' x 2 to and from bathroom to recliner) Assistive device: None Gait Pattern/deviations: Step-through pattern Gait velocity: reduced Gait velocity interpretation: <1.8 ft/sec, indicate of risk for recurrent falls   General Gait Details: pt with slowd step-through gait, widened BOS. Longer distances deferred as pt with tachycardia up to high 150s with short bouts of ambulation in room  Stairs            Wheelchair Mobility     Tilt Bed    Modified Rankin (Stroke Patients Only)       Balance Overall balance assessment: Needs assistance Sitting-balance support: No upper extremity supported, Feet supported Sitting balance-Leahy Scale: Good     Standing balance support: No upper extremity supported, During functional activity Standing balance-Leahy Scale: Good                               Pertinent Vitals/Pain Pain Assessment Pain Assessment: No/denies pain    Home Living Family/patient expects to be discharged to:: Assisted living [facility])                 Home Equipment: None      Prior Function Prior Level of Function : Patient poor historian/Family not available             Mobility Comments: pt appears to be ambulatory without DME based on evaluation       Extremity/Trunk Assessment  Upper Extremity Assessment Upper Extremity Assessment: Overall WFL for tasks assessed    Lower Extremity Assessment Lower Extremity Assessment: Overall WFL for tasks assessed    Cervical / Trunk Assessment Cervical / Trunk Assessment: Kyphotic  Communication   Communication Communication: No apparent difficulties Cueing Techniques: Verbal cues  Cognition Arousal: Alert Behavior During Therapy: WFL for tasks assessed/performed Overall Cognitive Status: History of cognitive impairments - at baseline                                  General Comments: alert and oriented to person only. Pt follows commands well. Poor awareness of situation and poor memory.        General Comments General comments (skin integrity, edema, etc.): pt appears to still be in afib, HR up to 159 when mobilizing and in 120-130s at rest in recliner pre-ambulation. Pt is on room air, pleth reading in low 90s when with a reliable reading    Exercises     Assessment/Plan    PT Assessment Patient needs continued PT services  PT Problem List Decreased activity tolerance;Decreased balance;Decreased mobility;Cardiopulmonary status limiting activity       PT Treatment Interventions DME instruction;Gait training;Functional mobility training;Therapeutic activities;Therapeutic exercise;Balance training;Neuromuscular re-education;Patient/family education;Cognitive remediation    PT Goals (Current goals can be found in the Care Plan section)  Acute Rehab PT Goals Patient Stated Goal: to return to [place] PT Goal Formulation: With patient Time For Goal Achievement: 06/21/23 Potential to Achieve Goals: Good Additional Goals Additional Goal #1: Pt will report 0/4 DOE and have a HR less than 130 BPM when ambulating for >250' to demonstrate improved activity tolerance    Frequency Min 1X/week     Co-evaluation               AM-PAC PT "6 Clicks" Mobility  Outcome Measure Help needed turning from your back to your side while in a flat bed without using bedrails?: A Little Help needed moving from lying on your back to sitting on the side of a flat bed without using bedrails?: A Little Help needed moving to and from a bed to a chair (including a wheelchair)?: A Little Help needed standing up from a chair using your arms (e.g., wheelchair or bedside chair)?: A Little Help needed to walk in hospital room?: A Little Help needed climbing 3-5 steps with a railing? : A Little 6 Click Score: 18    End of Session    Activity Tolerance: Treatment limited secondary to medical complications (Comment) (tachycardia) Patient left: in chair;with call bell/phone within reach;with chair alarm set;with nursing/sitter in room Nurse Communication: Mobility status PT Visit Diagnosis: Other abnormalities of gait and mobility (R26.89)    Time: 9147-8295 PT Time Calculation (min) (ACUTE ONLY): 12 min   Charges:   PT Evaluation $PT Eval Low Complexity: 1 Low   PT General Charges $$ ACUTE PT VISIT: 1 Visit         Arlyss Gandy, PT, DPT Acute Rehabilitation Office [phone]   Arlyss Gandy 06/07/2023, 5:21 PM

## 2023-06-07 NOTE — Evaluation (Signed)
Occupational Therapy Evaluation Patient Details Name: Angelica Kelley MRN: [id number] DOB: [birth year] Today's Date: 06/07/2023   History of Present Illness Pt is a 78 yo female presenting to [facility] ED on 06/06/23 for the evaluation of SOB and hypoxia being worked up for RSV. PMH of PAF not on anticoagulation, HTN, HLD, moderate aortic stenosis, depression/anxiety   Clinical Impression   Pt admitted for above, she is oriented to herself only but overall pleasant and follows commands. Pt ambulatory around room with CGA no AD, seems CGA to setup for ADLs but is limited by elevated HR with simple activity. Pt Sp02 stable but pt very fatigued and showing signs of increased respiratory rate after ambulating 48ft. OT to continue following pt acutely to address listed deficits and reinforce energy conservation strategies prn. Anticipate pt has not post acute OT needs, would benefit from frequent supervision at her ALF.       If plan is discharge home, recommend the following: Supervision due to cognitive status;Direct supervision/assist for medications management;Direct supervision/assist for financial management;Assistance with cooking/housework;Assist for transportation    Functional Status Assessment  Patient has had a recent decline in their functional status and demonstrates the ability to make significant improvements in function in a reasonable and predictable amount of time.  Equipment Recommendations  None recommended by OT    Recommendations for Other Services       Precautions / Restrictions Precautions Precautions: Fall Precaution Comments: RSV precautions, Urinary incontinence? Restrictions Weight Bearing Restrictions Per Provider Order: No      Mobility Bed Mobility               General bed mobility comments: Pt up in recliner on arrival    Transfers Overall transfer level: Needs assistance   Transfers: Sit to/from Stand Sit to Stand: Contact guard assist                   Balance Overall balance assessment: Needs assistance Sitting-balance support: Feet supported Sitting balance-Leahy Scale: Good Sitting balance - Comments: in recliner, not formally assessed   Standing balance support: During functional activity, No upper extremity supported Standing balance-Leahy Scale: Fair                             ADL either performed or assessed with clinical judgement   ADL Overall ADL's : Needs assistance/impaired Eating/Feeding: Independent;Sitting   Grooming: Standing;Contact guard assist   Upper Body Bathing: Standing;Contact guard assist   Lower Body Bathing: Sitting/lateral leans;Set up     Upper Body Dressing Details (indicate cue type and reason): needs further assessment   Lower Body Dressing Details (indicate cue type and reason): needs futher assessment Toilet Transfer: Contact guard assist;Ambulation   Toileting- Clothing Manipulation and Hygiene: Sitting/lateral lean;Contact guard assist       Functional mobility during ADLs: Contact guard assist General ADL Comments: Pt declined need/desire to complete ADLs at this time, anticipate pt at least CGA level for OOb ADLs based on balance and processing capacity     Vision         Perception         Praxis         Pertinent Vitals/Pain Pain Assessment Pain Assessment: No/denies pain     Extremity/Trunk Assessment Upper Extremity Assessment Upper Extremity Assessment: Overall WFL for tasks assessed   Lower Extremity Assessment Lower Extremity Assessment: Defer to PT evaluation       Communication Communication Communication:  No apparent difficulties Cueing Techniques: Verbal cues;Gestural cues   Cognition Arousal: Alert Behavior During Therapy: WFL for tasks assessed/performed Overall Cognitive Status: History of cognitive impairments - at baseline                                 General Comments: A&Ox1. pt continues to state,  "I'm not sure or I don't know" regarding questions about home, PLOF, or if she has to pee     General Comments  Pt in afib during OOb activity, HR up to 133 bpm with ambulation ~73ft. Sp02 stable on RA >95% but pt seemingly visulally fatigued, she does note that her heart is beating rapidly. HR back down to 89 bpm with seated rest    Exercises     Shoulder Instructions      Home Living Family/patient expects to be discharged to:: Assisted living [facility])                                 Additional Comments: Pt not able to note any DME, PLOF, or setup of home environment      Prior Functioning/Environment Prior Level of Function : Patient poor historian/Family not available             Mobility Comments: unknown ADLs Comments: unknown        OT Problem List: Decreased cognition;Impaired balance (sitting and/or standing)      OT Treatment/Interventions: Self-care/ADL training;Therapeutic activities;DME and/or AE instruction;Balance training;Patient/family education;Energy conservation    OT Goals(Current goals can be found in the care plan section) Acute Rehab OT Goals Patient Stated Goal: none stated OT Goal Formulation: With patient Time For Goal Achievement: 06/21/23 Potential to Achieve Goals: Good ADL Goals Pt Will Perform Grooming: with supervision;standing Pt Will Perform Lower Body Dressing: with supervision;sit to/from stand Pt Will Transfer to Toilet: with supervision;ambulating Additional ADL Goal #2: pt will demonstrate independent use of energy conservation strategies prn to complete ADLs  OT Frequency: Min 1X/week    Co-evaluation              AM-PAC OT "6 Clicks" Daily Activity     Outcome Measure Help from another person eating meals?: None Help from another person taking care of personal grooming?: A Little Help from another person toileting, which includes using toliet, bedpan, or urinal?: A Little Help from another person  bathing (including washing, rinsing, drying)?: A Little Help from another person to put on and taking off regular upper body clothing?: A Little Help from another person to put on and taking off regular lower body clothing?: A Little 6 Click Score: 19   End of Session Equipment Utilized During Treatment: Gait belt Nurse Communication: Mobility status  Activity Tolerance: Treatment limited secondary to medical complications (Comment) (elevated HR) Patient left: in chair;with call bell/phone within reach;with chair alarm set  OT Visit Diagnosis: Unsteadiness on feet (R26.81);Other (comment) (SOB)                Time: 1000-1020 OT Time Calculation (min): 20 min Charges:  OT General Charges $OT Visit: 1 Visit OT Evaluation $OT Eval Low Complexity: 1 Low  06/07/2023  AB, OTR/L  Acute Rehabilitation Services  Office: [phone]   Tristan Schroeder 06/07/2023, 10:43 AM

## 2023-06-08 DIAGNOSIS — J210 Acute bronchiolitis due to respiratory syncytial virus: Secondary | ICD-10-CM

## 2023-06-08 LAB — URINALYSIS, COMPLETE (UACMP) WITH MICROSCOPIC
Bacteria, UA: NONE SEEN
Bilirubin Urine: NEGATIVE
Glucose, UA: NEGATIVE mg/dL
Ketones, ur: NEGATIVE mg/dL
Leukocytes,Ua: NEGATIVE
Nitrite: NEGATIVE
Protein, ur: 30 mg/dL — AB
Specific Gravity, Urine: 1.01 (ref 1.005–1.030)
pH: 5 (ref 5.0–8.0)

## 2023-06-08 MED ORDER — AMLODIPINE BESYLATE 10 MG PO TABS
10.0000 mg | ORAL_TABLET | Freq: Every day | ORAL | Status: DC
  Administered 2023-06-08 – 2023-06-12 (×5): 10 mg via ORAL
  Filled 2023-06-08 (×3): qty 1
  Filled 2023-06-08: qty 2
  Filled 2023-06-08: qty 1

## 2023-06-08 MED ORDER — HYDRALAZINE HCL 20 MG/ML IJ SOLN
10.0000 mg | Freq: Four times a day (QID) | INTRAMUSCULAR | Status: DC | PRN
Start: 2023-06-08 — End: 2023-06-12
  Administered 2023-06-09: 10 mg via INTRAVENOUS
  Filled 2023-06-08: qty 1

## 2023-06-08 MED ORDER — HALOPERIDOL LACTATE 5 MG/ML IJ SOLN
5.0000 mg | Freq: Once | INTRAMUSCULAR | Status: AC | PRN
  Administered 2023-06-08: 5 mg via INTRAVENOUS
  Filled 2023-06-08: qty 1

## 2023-06-08 MED ORDER — LABETALOL HCL 5 MG/ML IV SOLN
10.0000 mg | INTRAVENOUS | Status: DC | PRN
  Administered 2023-06-08 (×2): 10 mg via INTRAVENOUS
  Filled 2023-06-08 (×2): qty 4

## 2023-06-08 MED ORDER — BENZONATATE 100 MG PO CAPS
200.0000 mg | ORAL_CAPSULE | Freq: Three times a day (TID) | ORAL | Status: DC | PRN
  Administered 2023-06-08 – 2023-06-11 (×3): 200 mg via ORAL
  Filled 2023-06-08 (×3): qty 2

## 2023-06-08 MED ORDER — METHYLPREDNISOLONE SODIUM SUCC 40 MG IJ SOLR: 20.0000 mg | Freq: Every day | INTRAMUSCULAR | Status: DC

## 2023-06-08 NOTE — Plan of Care (Signed)
Problem: Health Behavior/Discharge Planning: Goal: Ability to manage health-related needs will improve Outcome: Progressing   Problem: Clinical Measurements: Goal: Cardiovascular complication will be avoided Outcome: Progressing

## 2023-06-08 NOTE — Progress Notes (Signed)
Patient is alert, forgetful, confused. PT tried to get out of the bed to go to restroom constantly for frequent urination. Patient had Afib RVR with exertion HR up to 145 and back to 98 at rest quickly. BP  @ 0439 176/107 hr 102.  @ 0442 BP 160/98 hr 98 @ 0451 BP 171/97 MD notified PT Was given 10 mg  IV Labetalol per order rechecked BP @ 0600 BP 160/ 102 hr 92. Patient coughed frequency . MD notified was given 200 mg PO  Tessalon . PT is monitored by tele sitter . placed  Fall mat at bedside, sock and bedside alarm. 400 mls urine  out put. Urine sample has sent per order. Skin is intact. Standby assistance.

## 2023-06-08 NOTE — Progress Notes (Signed)
PROGRESS NOTE        PATIENT DETAILS Name: Angelica Kelley Age: 78 y.o. Sex: female Date of Birth: April 17, 1946 Admit Date: 06/06/2023 Admitting Physician Angie Fava, DO [id number], [facility]  Brief Summary: Patient is a 78 y.o.  female with history of dementia, HTN, HLD, aortic stenosis, PAF not on anticoagulation, depression/anxiety-who presented with shortness of breath-found to have acute hypoxic respiratory failure due to RSV bronchitis.  Significant events: 1/23>> to ED from ALF-SOB/hypoxic-RSV+ve  Significant studies: 1/23>> CXR: No acute findings-chronic interstitial changes.  Significant microbiology data: 1/23>> RSV PCR: Positive 1/23>> COVID/influenza PCR: Negative  Procedures: None  Consults: None  Subjective: GEN in bed appears to be in no distress, denies any headache, no chest pain or shortness of breath.  Objective: Vitals: Blood pressure (!) 173/113, pulse (!) 106, temperature 97.6 F (36.4 C), resp. rate (!) 24, weight 77.9 kg, SpO2 100%.   Exam:  Awake but remains pleasantly confused, No new F.N deficits, Normal affect [place].AT,PERRAL Supple Neck, No JVD,   Symmetrical Chest wall movement, Good air movement bilaterally, CTAB RRR,No Gallops, Rubs or new Murmurs,  +ve B.Sounds, Abd Soft, No tenderness,   No Cyanosis, Clubbing or edema    Assessment/Plan:  Acute hypoxic respiratory failure due to RSV bronchitis Still wheezing-but comfortable Continue steroids/bronchodilators/mucolytic's Although no obvious signs of volume overload-will give 1 dose of IV Lasix to ensure negative balance.  Titrate off steroids. Incentive spirometry/flutter valve-if able to follow directions Out of bed to chair-mobilize with PT/OT Titrate down FiO2 as tolerated-currently on 3 L   PAF with intermittent RVR Rate controlled-will attempt to discontinue Cardizem infusion (not on any rate control agents at home) Per prior  documentation-not on chronic anticoagulation due to history of fall/GI bleed/subdural hematoma As needed metoprolol.  Microcytic anemia Hb not far from prior baseline No evidence of GI bleeding Iron panel consistent with iron deficiency Placed on oral iron supplementation  HLD Statin  Constipation predominant IBS Resume laxatives  Hypertension.  Placed on Norvasc and as needed hydralazine   Dementia At risk for delirium Maintain delirium precautions  Depression/anxiety Zoloft As needed Klonopin  BMI: Estimated body mass index is 26.74 kg/m as calculated from the following:   Height as of 10/18/20: 5' 7.2" (1.707 m).   Weight as of this encounter: 77.9 kg.   Code status:   Code Status: Limited: Do not attempt resuscitation (DNR) -DNR-LIMITED -Do Not Intubate/DNI -this was confirmed with niece Almyra Free over the phone by this MD.  No intubation no CPR but otherwise continue to treat with antibiotics/steroids etc.  DVT Prophylaxis: enoxaparin (LOVENOX) injection 40 mg Start: 06/07/23 1000   Family Communication: Niece-[name] [name]-[phone] -updated 1/24   Disposition Plan: Status is: Inpatient Remains inpatient appropriate because: Severity of illness   Planned Discharge Destination:Assisted living   Diet: Diet Order             Diet Heart Fluid consistency: Thin  Diet effective now                     Antimicrobial agents: Anti-infectives (From admission, onward)    None        MEDICATIONS: Scheduled Meds:  amLODipine  10 mg Oral Daily   arformoterol  15 mcg Nebulization BID   budesonide (PULMICORT) nebulizer solution  0.25 mg Nebulization BID   cholecalciferol  1,000  Units Oral Daily   donepezil  10 mg Oral QHS   enoxaparin (LOVENOX) injection  40 mg Subcutaneous Q24H   ferrous sulfate  325 mg Oral Q breakfast   gabapentin  200 mg Oral BID   guaiFENesin  600 mg Oral BID   levalbuterol  0.63 mg Nebulization TID   And   ipratropium  0.5 mg  Nebulization TID   loratadine  10 mg Oral Daily   [START ON 06/09/2023] methylPREDNISolone (SOLU-MEDROL) injection  20 mg Intravenous Daily   pantoprazole  40 mg Oral BID   polyethylene glycol  17 g Oral Daily   rosuvastatin  40 mg Oral Daily   senna-docusate  1 tablet Oral QHS   sertraline  200 mg Oral q AM   sodium chloride flush  3 mL Intravenous Q12H   thiamine  100 mg Oral Daily   Continuous Infusions:   PRN Meds:.acetaminophen **OR** acetaminophen, benzonatate, hydrALAZINE, ipratropium-albuterol, labetalol, melatonin, metoprolol tartrate, [DISCONTINUED] ondansetron **OR** ondansetron (ZOFRAN) IV   I have personally reviewed following labs and imaging studies  LABORATORY DATA: Recent Labs  Lab 06/06/23 1123 06/07/23 0505  WBC 5.0 4.4  HGB 9.4* 9.1*  HCT 32.5* 31.2*  PLT 165 150  MCV 76.7* 75.9*  MCH 22.2* 22.1*  MCHC 28.9* 29.2*  RDW 16.9* 17.0*    Recent Labs  Lab 06/06/23 1123 06/07/23 0505  NA 141 143  K 3.4* 4.0  CL 106 107  CO2 27 27  ANIONGAP 8 9  GLUCOSE 117* 125*  BUN 10 11  CREATININE 0.74 0.70  AST 22  --   ALT 13  --   ALKPHOS 50  --   BILITOT 0.7  --   ALBUMIN 3.4*  --   BNP 195.9*  --   CALCIUM 8.6* 8.6*    Lab Results  Component Value Date   CHOL 174 08/07/2017   HDL 70 08/07/2017   LDLCALC 83 08/07/2017   TRIG 113 08/07/2017   CHOLHDL 2.5 08/07/2017      Recent Labs  Lab 06/06/23 1123 06/07/23 0505  BNP 195.9*  --   CALCIUM 8.6* 8.6*   Anemia Panel: Recent Labs    06/07/23 0505  VITAMINB12 2,235*  FOLATE 10.5  FERRITIN 10*  TIBC 428  IRON 21*    Urine analysis:    Component Value Date/Time   COLORURINE YELLOW 06/08/2023 0100   APPEARANCEUR CLEAR 06/08/2023 0100   LABSPEC 1.010 06/08/2023 0100   PHURINE 5.0 06/08/2023 0100   GLUCOSEU NEGATIVE 06/08/2023 0100   HGBUR SMALL (A) 06/08/2023 0100   BILIRUBINUR NEGATIVE 06/08/2023 0100   KETONESUR NEGATIVE 06/08/2023 0100   PROTEINUR 30 (A) 06/08/2023 0100    UROBILINOGEN 1 06/09/2014 1531   NITRITE NEGATIVE 06/08/2023 0100   LEUKOCYTESUR NEGATIVE 06/08/2023 0100    Sepsis Labs: Lactic Acid, Venous    Component Value Date/Time   LATICACIDVEN 1.6 10/18/2020 1300    MICROBIOLOGY: Recent Results (from the past 240 hours)  Resp panel by RT-PCR (RSV, Flu A&B, Covid)     Status: Abnormal   Collection Time: 06/06/23 12:15 PM  Result Value Ref Range Status   SARS Coronavirus 2 by RT PCR NEGATIVE NEGATIVE Final   Influenza A by PCR NEGATIVE NEGATIVE Final   Influenza B by PCR NEGATIVE NEGATIVE Final    Comment: (NOTE) The Xpert Xpress SARS-CoV-2/FLU/RSV plus assay is intended as an aid in the diagnosis of influenza from Nasopharyngeal swab specimens and should not be used as a sole basis for  treatment. Nasal washings and aspirates are unacceptable for Xpert Xpress SARS-CoV-2/FLU/RSV testing.  Fact Sheet for Patients: BloggerCourse.com  Fact Sheet for Healthcare Providers: SeriousBroker.it  This test is not yet approved or cleared by the [place] FDA and has been authorized for detection and/or diagnosis of SARS-CoV-2 by FDA under an Emergency Use Authorization (EUA). This EUA will remain in effect (meaning this test can be used) for the duration of the COVID-19 declaration under Section 564(b)(1) of the Act, 21 U.S.C. section 360bbb-3(b)(1), unless the authorization is terminated or revoked.     Resp Syncytial Virus by PCR POSITIVE (A) NEGATIVE Final    Comment: (NOTE) Fact Sheet for Patients: BloggerCourse.com  Fact Sheet for Healthcare Providers: SeriousBroker.it  This test is not yet approved or cleared by the [place] FDA and has been authorized for detection and/or diagnosis of SARS-CoV-2 by FDA under an Emergency Use Authorization (EUA). This EUA will remain in effect (meaning this test can be used) for the duration  of the COVID-19 declaration under Section 564(b)(1) of the Act, 21 U.S.C. section 360bbb-3(b)(1), unless the authorization is terminated or revoked.  Performed at [facility], [address], [place], [place] [address]     RADIOLOGY STUDIES/RESULTS: DG Chest Portable 1 View Result Date: 06/06/2023 CLINICAL DATA:  78 year old female with shortness of breath, productive cough. Wheezing. EXAM: PORTABLE CHEST 1 VIEW COMPARISON:  Chest radiographs 10/18/2020 and earlier. FINDINGS: Portable AP upright view at 1225 hours. Mildly improved lung volumes compared to prior. Chronic elevation of the right hemidiaphragm is mild and stable. Calcified aortic atherosclerosis. Other mediastinal contours are within normal limits. Coarse bilateral chronic pulmonary interstitial opacity, not significantly changed from 2020. Bronchiectasis at the lung bases demonstrated on a 2022 CT. No pneumothorax, pulmonary edema, pleural effusion or confluent lung opacity. No acute osseous abnormality identified. IMPRESSION: Chronic lung disease with interstitial changes on prior CT. No acute cardiopulmonary abnormality. Electronically Signed   By: Odessa Fleming M.D.   On: 06/06/2023 12:54     LOS: 2 days   Signature  -    Susa Raring M.D on 06/08/2023 at 9:52 AM   -  To page go to [email or web address]

## 2023-06-08 NOTE — Progress Notes (Signed)
TRH night cross cover note:   I was notified by RN that the patient's elevated systolic blood pressures into the 160s to 170s mmHg, with heart rates in the 90s.  I subsequently added prn IV labetalol for systolic blood pressure greater than 170  mmHg or diastolic blood pressure greater than 110 mmHg.    Angelica Pigg, DO Hospitalist

## 2023-06-08 NOTE — Progress Notes (Signed)
TRH night cross cover note:  I was notified by RN that this patient is agitated, continuously attempting to get out of bed, and ambulate out into the hallway in setting of being increased fall risk, with these behaviors refractory to attempts at verbal redirection.  In the setting of associated interference with ongoing medical treatment posing potential harm to themself, I have placed orders for a one-time prn dose of iv haldol as well as soft waist restraint.   Angelica Pigg, DO Hospitalist

## 2023-06-08 NOTE — Progress Notes (Signed)
TRH night cross cover note:  Prn Tessalon Perles added for cough.   Newton Pigg, DO Hospitalist

## 2023-06-08 NOTE — Plan of Care (Signed)
  Problem: Education: Goal: Knowledge of General Education information will improve Description: Including pain rating scale, medication(s)/side effects and non-pharmacologic comfort measures 06/08/2023 1457 by Ellison Carwin, RN Outcome: Progressing 06/08/2023 1456 by Ellison Carwin, RN Outcome: Progressing   Problem: Health Behavior/Discharge Planning: Goal: Ability to manage health-related needs will improve 06/08/2023 1457 by Ellison Carwin, RN Outcome: Progressing 06/08/2023 1456 by Ellison Carwin, RN Outcome: Progressing   Problem: Clinical Measurements: Goal: Ability to maintain clinical measurements within normal limits will improve 06/08/2023 1457 by Ellison Carwin, RN Outcome: Progressing 06/08/2023 1456 by Ellison Carwin, RN Outcome: Progressing Goal: Will remain free from infection 06/08/2023 1457 by Ellison Carwin, RN Outcome: Progressing 06/08/2023 1456 by Ellison Carwin, RN Outcome: Progressing Goal: Diagnostic test results will improve 06/08/2023 1457 by Ellison Carwin, RN Outcome: Progressing 06/08/2023 1456 by Ellison Carwin, RN Outcome: Progressing Goal: Respiratory complications will improve 06/08/2023 1457 by Ellison Carwin, RN Outcome: Progressing 06/08/2023 1456 by Ellison Carwin, RN Outcome: Progressing Goal: Cardiovascular complication will be avoided 06/08/2023 1457 by Ellison Carwin, RN Outcome: Progressing 06/08/2023 1456 by Ellison Carwin, RN Outcome: Progressing   Problem: Activity: Goal: Risk for activity intolerance will decrease 06/08/2023 1457 by Ellison Carwin, RN Outcome: Progressing 06/08/2023 1456 by Ellison Carwin, RN Outcome: Progressing   Problem: Nutrition: Goal: Adequate nutrition will be maintained 06/08/2023 1457 by Ellison Carwin, RN Outcome: Progressing 06/08/2023 1456 by Ellison Carwin, RN Outcome: Progressing   Problem: Coping: Goal: Level of anxiety will decrease 06/08/2023 1457 by Ellison Carwin, RN Outcome: Progressing 06/08/2023 1456 by Ellison Carwin, RN Outcome: Progressing   Problem: Elimination: Goal: Will not experience complications related to bowel motility 06/08/2023 1457 by Ellison Carwin, RN Outcome: Progressing 06/08/2023 1456 by Ellison Carwin, RN Outcome: Progressing Goal: Will not experience complications related to urinary retention 06/08/2023 1457 by Ellison Carwin, RN Outcome: Progressing 06/08/2023 1456 by Ellison Carwin, RN Outcome: Progressing   Problem: Pain Managment: Goal: General experience of comfort will improve and/or be controlled 06/08/2023 1457 by Ellison Carwin, RN Outcome: Progressing 06/08/2023 1456 by Ellison Carwin, RN Outcome: Progressing   Problem: Safety: Goal: Ability to remain free from injury will improve 06/08/2023 1457 by Ellison Carwin, RN Outcome: Progressing 06/08/2023 1456 by Ellison Carwin, RN Outcome: Progressing   Problem: Skin Integrity: Goal: Risk for impaired skin integrity will decrease 06/08/2023 1457 by Ellison Carwin, RN Outcome: Progressing 06/08/2023 1456 by Ellison Carwin, RN Outcome: Progressing

## 2023-06-09 ENCOUNTER — Inpatient Hospital Stay (HOSPITAL_COMMUNITY): Payer: Medicare HMO

## 2023-06-09 DIAGNOSIS — J210 Acute bronchiolitis due to respiratory syncytial virus: Secondary | ICD-10-CM | POA: Diagnosis not present

## 2023-06-09 LAB — CBC WITH DIFFERENTIAL/PLATELET
Abs Immature Granulocytes: 0.05 10*3/uL (ref 0.00–0.07)
Basophils Absolute: 0 10*3/uL (ref 0.0–0.1)
Basophils Relative: 0 %
Eosinophils Absolute: 0 10*3/uL (ref 0.0–0.5)
Eosinophils Relative: 0 %
HCT: 35.6 % — ABNORMAL LOW (ref 36.0–46.0)
Hemoglobin: 10.8 g/dL — ABNORMAL LOW (ref 12.0–15.0)
Immature Granulocytes: 1 %
Lymphocytes Relative: 9 %
Lymphs Abs: 0.9 10*3/uL (ref 0.7–4.0)
MCH: 21.9 pg — ABNORMAL LOW (ref 26.0–34.0)
MCHC: 30.3 g/dL (ref 30.0–36.0)
MCV: 72.1 fL — ABNORMAL LOW (ref 80.0–100.0)
Monocytes Absolute: 0.5 10*3/uL (ref 0.1–1.0)
Monocytes Relative: 5 %
Neutro Abs: 8.6 10*3/uL — ABNORMAL HIGH (ref 1.7–7.7)
Neutrophils Relative %: 85 %
Platelets: 237 10*3/uL (ref 150–400)
RBC: 4.94 MIL/uL (ref 3.87–5.11)
RDW: 17 % — ABNORMAL HIGH (ref 11.5–15.5)
WBC: 10.1 10*3/uL (ref 4.0–10.5)
nRBC: 0 % (ref 0.0–0.2)

## 2023-06-09 LAB — C-REACTIVE PROTEIN: CRP: 0.6 mg/dL (ref <1.0)

## 2023-06-09 LAB — BASIC METABOLIC PANEL
Anion gap: 14 (ref 5–15)
BUN: 20 mg/dL (ref 8–23)
CO2: 24 mmol/L (ref 22–32)
Calcium: 9.1 mg/dL (ref 8.9–10.3)
Chloride: 99 mmol/L (ref 98–111)
Creatinine, Ser: 0.88 mg/dL (ref 0.44–1.00)
GFR, Estimated: >60 mL/min (ref >60)
Glucose, Bld: 120 mg/dL — ABNORMAL HIGH (ref 70–99)
Potassium: 3 mmol/L — ABNORMAL LOW (ref 3.5–5.1)
Sodium: 137 mmol/L (ref 135–145)

## 2023-06-09 LAB — MAGNESIUM
Magnesium: 2.1 mg/dL (ref 1.7–2.4)
Magnesium: 2.4 mg/dL (ref 1.7–2.4)

## 2023-06-09 LAB — PROCALCITONIN: Procalcitonin: <0.1 ng/mL

## 2023-06-09 LAB — BRAIN NATRIURETIC PEPTIDE: B Natriuretic Peptide: 133.6 pg/mL — ABNORMAL HIGH (ref 0.0–100.0)

## 2023-06-09 LAB — PHOSPHORUS: Phosphorus: 4.5 mg/dL (ref 2.5–4.6)

## 2023-06-09 MED ORDER — SODIUM CHLORIDE 0.9 % IV SOLN
500.0000 mg | INTRAVENOUS | Status: DC
  Administered 2023-06-09 – 2023-06-12 (×3): 500 mg via INTRAVENOUS
  Filled 2023-06-09 (×3): qty 5

## 2023-06-09 MED ORDER — METHYLPREDNISOLONE SODIUM SUCC 125 MG IJ SOLR
80.0000 mg | Freq: Once | INTRAMUSCULAR | Status: AC
  Administered 2023-06-09: 80 mg via INTRAVENOUS
  Filled 2023-06-09: qty 2

## 2023-06-09 MED ORDER — METHYLPREDNISOLONE SODIUM SUCC 40 MG IJ SOLR: 40.0000 mg | Freq: Every day | INTRAMUSCULAR | Status: DC

## 2023-06-09 MED ORDER — SODIUM CHLORIDE 0.9 % IV SOLN
1.0000 g | INTRAVENOUS | Status: DC
  Administered 2023-06-09 – 2023-06-11 (×3): 1 g via INTRAVENOUS
  Filled 2023-06-09 (×3): qty 10

## 2023-06-09 MED ORDER — METHYLPREDNISOLONE SODIUM SUCC 125 MG IJ SOLR
80.0000 mg | Freq: Every day | INTRAMUSCULAR | Status: DC
  Administered 2023-06-09 – 2023-06-10 (×2): 80 mg via INTRAVENOUS
  Filled 2023-06-09 (×2): qty 2

## 2023-06-09 MED ORDER — FUROSEMIDE 10 MG/ML IJ SOLN
40.0000 mg | Freq: Once | INTRAMUSCULAR | Status: AC
  Administered 2023-06-09: 40 mg via INTRAVENOUS
  Filled 2023-06-09: qty 4

## 2023-06-09 MED ORDER — POTASSIUM CHLORIDE CRYS ER 20 MEQ PO TBCR
40.0000 meq | EXTENDED_RELEASE_TABLET | ORAL | Status: AC
  Administered 2023-06-09 (×2): 40 meq via ORAL
  Filled 2023-06-09 (×2): qty 2

## 2023-06-09 NOTE — Progress Notes (Signed)
TRH night cross cover note:   I was notified by RN of report of new rales on the right, along with chest x-ray showed right lower lobe airspace opacity and read is concerning for pneumonia.  Procalcitonin level this morning was not elevated.  However, given report of new rales, this patient with ongoing shortness of breath, I have ordered blood cultures x 2 as well as azithromycin and Rocephin for potential community-acquired pneumonia.  Procalcitonin level is already ordered for the morning.  Of note, in the absence of objective fever and in the absence of leukocytosis, SIRS criteria are not met for sepsis at this time.   Newton Pigg, DO Hospitalist

## 2023-06-09 NOTE — Progress Notes (Addendum)
TRH night cross cover note:   I was notified by RN that this patient was admitted with RSV bronchitis, is having some worsening wheezing, with persistence following scheduled Xopenex nebulizer as well as prn duo nebulizer treatment and Pulmicort nebulizer treatment and Brovana.  There was reported to be some improvement in her wheezing initially earlier today following increased dose of solumedrol, with current order for Solu-Medrol 80 mg IV daily, with next dose to occur tomorrow morning.  Oxygen saturation remains in the mid 90s on 4 L nasal cannula, noting that she has been on 4 to 5 L nasal cannula throughout the day.  Attempting to wean solumedrol, but will order a one-time additional dose of solumedrol 80 mg IV x 1 dose now in light of the worsening wheezing.  Not on any IV fluids, and today's chest x-ray showed no evidence of pulmonary edema to suggest an element of cardiac wheeze.  Will also also mag level to be checked at this time.     Newton Pigg, DO Hospitalist

## 2023-06-09 NOTE — Plan of Care (Signed)

## 2023-06-09 NOTE — Progress Notes (Signed)
RT called to patient bedisde, as pt has audible expiratory wheezing.  RT gave x2 PRN duoneb txs, afterwhich the pt's breath sounds are much improved w/greater aeration bilaterally.  Pt still has pronounced audible expiratory wheezing from the upper airways.  RT will continue to monitor

## 2023-06-09 NOTE — Progress Notes (Signed)
PROGRESS NOTE        PATIENT DETAILS Name: Angelica Kelley Age: 78 y.o. Sex: female Date of Birth: [birth year] Admit Date: 06/06/2023 Admitting Physician Angie Fava, DO [id number], [facility]  Brief Summary: Patient is a 78 y.o.  female with history of dementia, HTN, HLD, aortic stenosis, PAF not on anticoagulation, depression/anxiety-who presented with shortness of breath-found to have acute hypoxic respiratory failure due to RSV bronchitis.  Significant events: 1/23>> to ED from ALF-SOB/hypoxic-RSV+ve  Significant studies: 1/23>> CXR: No acute findings-chronic interstitial changes.  Significant microbiology data: 1/23>> RSV PCR: Positive 1/23>> COVID/influenza PCR: Negative  Procedures: None  Consults: None  Subjective:  Patient in bed, appears comfortable, denies any headache, no fever, came more short of breath sometime early this morning last night.   Objective: Vitals: Blood pressure (!) 145/101, pulse (!) 101, temperature 97.8 F (36.6 C), resp. rate (!) 21, weight 75 kg, SpO2 96%.   Exam:  Awake but remains pleasantly confused, No new F.N deficits, Normal affect [place].AT,PERRAL Supple Neck, No JVD,   Symmetrical Chest wall movement, Good air movement bilaterally, CTAB RRR,No Gallops, Rubs or new Murmurs,  +ve B.Sounds, Abd Soft, No tenderness,   No Cyanosis, Clubbing or edema    Assessment/Plan:  Acute hypoxic respiratory failure due to RSV bronchitis Was placed on combination of steroids, bronchodilators and mucolytics along with oxygen and as needed Lasix, initially showed improvement however early morning of 06/09/2023 worsened respiratory status with increased breathing and shortness of breath, repeat IV Lasix, continue steroids, check chest x-ray and labs.  Increase oxygen to 5 L and monitor.  PAF with intermittent RVR Rate controlled-will attempt to discontinue Cardizem infusion (not on any rate control agents at  home) Per prior documentation-not on chronic anticoagulation due to history of fall/GI bleed/subdural hematoma As needed metoprolol.  Microcytic anemia Hb not far from prior baseline No evidence of GI bleeding Iron panel consistent with iron deficiency Placed on oral iron supplementation  HLD Statin  Constipation predominant IBS Resume laxatives  Hypertension.  Placed on Norvasc and as needed hydralazine   Dementia At risk for delirium Maintain delirium precautions  Depression/anxiety Zoloft As needed Klonopin  BMI: Estimated body mass index is 25.74 kg/m as calculated from the following:   Height as of 10/18/20: 5' 7.2" (1.707 m).   Weight as of this encounter: 75 kg.   Code status:   Code Status: Limited: Do not attempt resuscitation (DNR) -DNR-LIMITED -Do Not Intubate/DNI -this was confirmed with niece Almyra Free over the phone by this MD.  No intubation no CPR but otherwise continue to treat with antibiotics/steroids etc.  DVT Prophylaxis: enoxaparin (LOVENOX) injection 40 mg Start: 06/07/23 1000   Family Communication: Niece-[name] [name]-[phone] -updated 06/09/2023   Disposition Plan: Status is: Inpatient Remains inpatient appropriate because: Severity of illness   Planned Discharge Destination:Assisted living   Diet: Diet Order             Diet Heart Fluid consistency: Thin  Diet effective now                     Antimicrobial agents: Anti-infectives (From admission, onward)    None        MEDICATIONS: Scheduled Meds:  amLODipine  10 mg Oral Daily   arformoterol  15 mcg Nebulization BID   budesonide (PULMICORT) nebulizer solution  0.25 mg  Nebulization BID   cholecalciferol  1,000 Units Oral Daily   donepezil  10 mg Oral QHS   enoxaparin (LOVENOX) injection  40 mg Subcutaneous Q24H   ferrous sulfate  325 mg Oral Q breakfast   gabapentin  200 mg Oral BID   guaiFENesin  600 mg Oral BID   levalbuterol  0.63 mg Nebulization TID   And    ipratropium  0.5 mg Nebulization TID   loratadine  10 mg Oral Daily   methylPREDNISolone (SOLU-MEDROL) injection  20 mg Intravenous Daily   pantoprazole  40 mg Oral BID   polyethylene glycol  17 g Oral Daily   rosuvastatin  40 mg Oral Daily   senna-docusate  1 tablet Oral QHS   sertraline  200 mg Oral q AM   sodium chloride flush  3 mL Intravenous Q12H   thiamine  100 mg Oral Daily   Continuous Infusions:   PRN Meds:.acetaminophen **OR** acetaminophen, benzonatate, hydrALAZINE, ipratropium-albuterol, melatonin, metoprolol tartrate, [DISCONTINUED] ondansetron **OR** ondansetron (ZOFRAN) IV   I have personally reviewed following labs and imaging studies  LABORATORY DATA: Recent Labs  Lab 06/06/23 1123 06/07/23 0505 06/09/23 0756  WBC 5.0 4.4 10.1  HGB 9.4* 9.1* 10.8*  HCT 32.5* 31.2* 35.6*  PLT 165 150 237  MCV 76.7* 75.9* 72.1*  MCH 22.2* 22.1* 21.9*  MCHC 28.9* 29.2* 30.3  RDW 16.9* 17.0* 17.0*  LYMPHSABS  --   --  0.9  MONOABS  --   --  0.5  EOSABS  --   --  0.0  BASOSABS  --   --  0.0    Recent Labs  Lab 06/06/23 1123 06/07/23 0505  NA 141 143  K 3.4* 4.0  CL 106 107  CO2 27 27  ANIONGAP 8 9  GLUCOSE 117* 125*  BUN 10 11  CREATININE 0.74 0.70  AST 22  --   ALT 13  --   ALKPHOS 50  --   BILITOT 0.7  --   ALBUMIN 3.4*  --   BNP 195.9*  --   CALCIUM 8.6* 8.6*    Lab Results  Component Value Date   CHOL 174 08/07/2017   HDL 70 08/07/2017   LDLCALC 83 08/07/2017   TRIG 113 08/07/2017   CHOLHDL 2.5 08/07/2017      Recent Labs  Lab 06/06/23 1123 06/07/23 0505  BNP 195.9*  --   CALCIUM 8.6* 8.6*   Anemia Panel: Recent Labs    06/07/23 0505  VITAMINB12 2,235*  FOLATE 10.5  FERRITIN 10*  TIBC 428  IRON 21*    Urine analysis:    Component Value Date/Time   COLORURINE YELLOW 06/08/2023 0100   APPEARANCEUR CLEAR 06/08/2023 0100   LABSPEC 1.010 06/08/2023 0100   PHURINE 5.0 06/08/2023 0100   GLUCOSEU NEGATIVE 06/08/2023 0100   HGBUR  SMALL (A) 06/08/2023 0100   BILIRUBINUR NEGATIVE 06/08/2023 0100   KETONESUR NEGATIVE 06/08/2023 0100   PROTEINUR 30 (A) 06/08/2023 0100   UROBILINOGEN 1 06/09/2014 1531   NITRITE NEGATIVE 06/08/2023 0100   LEUKOCYTESUR NEGATIVE 06/08/2023 0100    Sepsis Labs: Lactic Acid, Venous    Component Value Date/Time   LATICACIDVEN 1.6 10/18/2020 1300    MICROBIOLOGY: Recent Results (from the past 240 hours)  Resp panel by RT-PCR (RSV, Flu A&B, Covid)     Status: Abnormal   Collection Time: 06/06/23 12:15 PM  Result Value Ref Range Status   SARS Coronavirus 2 by RT PCR NEGATIVE NEGATIVE Final   Influenza A  by PCR NEGATIVE NEGATIVE Final   Influenza B by PCR NEGATIVE NEGATIVE Final    Comment: (NOTE) The Xpert Xpress SARS-CoV-2/FLU/RSV plus assay is intended as an aid in the diagnosis of influenza from Nasopharyngeal swab specimens and should not be used as a sole basis for treatment. Nasal washings and aspirates are unacceptable for Xpert Xpress SARS-CoV-2/FLU/RSV testing.  Fact Sheet for Patients: BloggerCourse.com  Fact Sheet for Healthcare Providers: SeriousBroker.it  This test is not yet approved or cleared by the [place] FDA and has been authorized for detection and/or diagnosis of SARS-CoV-2 by FDA under an Emergency Use Authorization (EUA). This EUA will remain in effect (meaning this test can be used) for the duration of the COVID-19 declaration under Section 564(b)(1) of the Act, 21 U.S.C. section 360bbb-3(b)(1), unless the authorization is terminated or revoked.     Resp Syncytial Virus by PCR POSITIVE (A) NEGATIVE Final    Comment: (NOTE) Fact Sheet for Patients: BloggerCourse.com  Fact Sheet for Healthcare Providers: SeriousBroker.it  This test is not yet approved or cleared by the [place] FDA and has been authorized for detection and/or diagnosis of  SARS-CoV-2 by FDA under an Emergency Use Authorization (EUA). This EUA will remain in effect (meaning this test can be used) for the duration of the COVID-19 declaration under Section 564(b)(1) of the Act, 21 U.S.C. section 360bbb-3(b)(1), unless the authorization is terminated or revoked.  Performed at [facility], [address], [place], [place] [address]     RADIOLOGY STUDIES/RESULTS: No results found.    LOS: 3 days   Signature  -    Susa Raring M.D on 06/09/2023 at 8:18 AM   -  To page go to [email or web address]

## 2023-06-09 NOTE — Plan of Care (Incomplete)
  Problem: Coping: Goal: Level of anxiety will decrease Outcome: Progressing   Problem: Elimination: Goal: Will not experience complications related to bowel motility Outcome: Progressing   Problem: Safety: Goal: Non-violent Restraint(s) Outcome: Progressing

## 2023-06-09 NOTE — Progress Notes (Signed)
Patient was alert, forgetful , very agitated at early evening last night . Patient needs someone to say in the room  with her . She got out of bed and walked around multiple times. MD notified, has ordered for 1/1 sitter , but sitter was not available .  Placed Posey soft belt restrain.  PT was given 5 mg melatonin, 650 mg PO Tylenol. Despite posey restrain , PT still got out of the bed. Charge nurse had to give PT IV Haldol, but PT still yelling and got up and down on the bed until 0400 am.  The nurse checked on PT frequency through the night. PT had  breathing treatment schedule and  PRN by respiratory therapist. At 0600 PT stared having wheezing despite 30 minutes after having breathing treatment, redness cheeks, lethargic BP 162/ 150 hr 95, PT was given 10 mg IV Hydralazine.  MD notified. DC restrain by order . Patient had increased wheezing and lethargic, oxygen 91, 92 on room air. Oxygen Sat up to 96- 97 with 2 L [place].  Rapid respond called, on the phone with the nurse . Patient was  given 80 mg Solumedrol ,  completed chest Xray . Labs ordered . PT is better after having IV Solumedrol.Rechecked BP 137/ 84 hr 93.

## 2023-06-09 NOTE — Progress Notes (Addendum)
TRH night cross cover note:   I was notified by RN that the patient appears to be coughing more over the course of tonight's shift, now associated with some wheezing.  The patient is here with RSV bronchitis predisposing her to bronchospasm.  However, given RNs report that the coughing and wheezing have progressed tonight, will check updated chest x-ray at this time.  Patient has also just received a nebulizer treatment and prn Tessalon Perles, without significant improvement in wheezing or coughing.  I have confirmed with the patient's RN that the patient is not currently receiving IV fluids.    Vital signs appear unchanged, with most recent vital signs reflecting afebrile,.  Blood pressures in the 150s, and oxygen saturations in the mid 90s on room air.   I will also add-on a mag level.    Update: patient noted to be more lethargic, and continuing to exhibit e/o wheezing. She has been undergoing steroid taper, but given increase in wheezing in setting of bronchitis I've ordered solumedrol 80 mg iv x 1 dose now. Will also check vbg to eval for any hypercapnia that may be contributory to her lethargy. Will also check the following at this time: bmp, cbc, mag, phos, bnp. I've also d/c'ed order for prn iv labetalol to eliminate any contribution from beta 2 blockage.    Newton Pigg, DO Hospitalist

## 2023-06-10 DIAGNOSIS — J210 Acute bronchiolitis due to respiratory syncytial virus: Secondary | ICD-10-CM | POA: Diagnosis not present

## 2023-06-10 LAB — MRSA NEXT GEN BY PCR, NASAL: MRSA by PCR Next Gen: NOT DETECTED

## 2023-06-10 LAB — CBC WITH DIFFERENTIAL/PLATELET
Abs Immature Granulocytes: 0.07 10*3/uL (ref 0.00–0.07)
Basophils Absolute: 0 10*3/uL (ref 0.0–0.1)
Basophils Relative: 0 %
Eosinophils Absolute: 0 10*3/uL (ref 0.0–0.5)
Eosinophils Relative: 0 %
HCT: 38.9 % (ref 36.0–46.0)
Hemoglobin: 11.5 g/dL — ABNORMAL LOW (ref 12.0–15.0)
Immature Granulocytes: 1 %
Lymphocytes Relative: 7 %
Lymphs Abs: 0.6 10*3/uL — ABNORMAL LOW (ref 0.7–4.0)
MCH: 21.8 pg — ABNORMAL LOW (ref 26.0–34.0)
MCHC: 29.6 g/dL — ABNORMAL LOW (ref 30.0–36.0)
MCV: 73.8 fL — ABNORMAL LOW (ref 80.0–100.0)
Monocytes Absolute: 0.4 10*3/uL (ref 0.1–1.0)
Monocytes Relative: 5 %
Neutro Abs: 8.2 10*3/uL — ABNORMAL HIGH (ref 1.7–7.7)
Neutrophils Relative %: 87 %
Platelets: 253 10*3/uL (ref 150–400)
RBC: 5.27 MIL/uL — ABNORMAL HIGH (ref 3.87–5.11)
RDW: 17 % — ABNORMAL HIGH (ref 11.5–15.5)
WBC: 9.3 10*3/uL (ref 4.0–10.5)
nRBC: 0 % (ref 0.0–0.2)

## 2023-06-10 LAB — C-REACTIVE PROTEIN: CRP: 0.5 mg/dL (ref <1.0)

## 2023-06-10 LAB — PROCALCITONIN: Procalcitonin: <0.1 ng/mL

## 2023-06-10 LAB — PHOSPHORUS: Phosphorus: 5.2 mg/dL — ABNORMAL HIGH (ref 2.5–4.6)

## 2023-06-10 LAB — BRAIN NATRIURETIC PEPTIDE: B Natriuretic Peptide: 95.7 pg/mL (ref 0.0–100.0)

## 2023-06-10 LAB — BLOOD GAS, VENOUS
Acid-Base Excess: 3.9 mmol/L — ABNORMAL HIGH (ref 0.0–2.0)
Bicarbonate: 27.7 mmol/L (ref 20.0–28.0)
O2 Saturation: 100 %
Patient temperature: 36.6
pCO2, Ven: 37 mm[Hg] — ABNORMAL LOW (ref 44–60)
pH, Ven: 7.48 — ABNORMAL HIGH (ref 7.25–7.43)
pO2, Ven: 105 mm[Hg] — ABNORMAL HIGH (ref 32–45)

## 2023-06-10 LAB — BASIC METABOLIC PANEL
Anion gap: 14 (ref 5–15)
BUN: 25 mg/dL — ABNORMAL HIGH (ref 8–23)
CO2: 23 mmol/L (ref 22–32)
Calcium: 9.1 mg/dL (ref 8.9–10.3)
Chloride: 101 mmol/L (ref 98–111)
Creatinine, Ser: 0.77 mg/dL (ref 0.44–1.00)
GFR, Estimated: >60 mL/min (ref >60)
Glucose, Bld: 142 mg/dL — ABNORMAL HIGH (ref 70–99)
Potassium: 4.3 mmol/L (ref 3.5–5.1)
Sodium: 138 mmol/L (ref 135–145)

## 2023-06-10 LAB — MAGNESIUM: Magnesium: 2.5 mg/dL — ABNORMAL HIGH (ref 1.7–2.4)

## 2023-06-10 MED ORDER — ZIPRASIDONE MESYLATE 20 MG IM SOLR
20.0000 mg | Freq: Once | INTRAMUSCULAR | Status: AC | PRN
  Administered 2023-06-10: 20 mg via INTRAMUSCULAR
  Filled 2023-06-10: qty 20

## 2023-06-10 MED ORDER — FUROSEMIDE 10 MG/ML IJ SOLN
20.0000 mg | Freq: Once | INTRAMUSCULAR | Status: AC
  Administered 2023-06-10: 20 mg via INTRAVENOUS
  Filled 2023-06-10: qty 2

## 2023-06-10 MED ORDER — METHYLPREDNISOLONE SODIUM SUCC 40 MG IJ SOLR
40.0000 mg | Freq: Every day | INTRAMUSCULAR | Status: DC
Start: 2023-06-11 — End: 2023-06-12
  Administered 2023-06-11 – 2023-06-12 (×2): 40 mg via INTRAVENOUS
  Filled 2023-06-10 (×2): qty 1

## 2023-06-10 MED ORDER — FUROSEMIDE 10 MG/ML IJ SOLN
40.0000 mg | Freq: Once | INTRAMUSCULAR | Status: AC
  Administered 2023-06-10: 40 mg via INTRAVENOUS
  Filled 2023-06-10: qty 4

## 2023-06-10 MED ORDER — HALOPERIDOL LACTATE 5 MG/ML IJ SOLN: 2.0000 mg | Freq: Four times a day (QID) | INTRAMUSCULAR | Status: DC | PRN

## 2023-06-10 MED ORDER — HALOPERIDOL LACTATE 5 MG/ML IJ SOLN
INTRAMUSCULAR | Status: AC
  Administered 2023-06-10: 2 mg
  Filled 2023-06-10: qty 1

## 2023-06-10 MED ORDER — QUETIAPINE FUMARATE 25 MG PO TABS: 25.0000 mg | ORAL_TABLET | Freq: Once | ORAL | Status: AC

## 2023-06-10 MED ORDER — STERILE WATER FOR INJECTION IJ SOLN
INTRAMUSCULAR | Status: AC
  Administered 2023-06-10: 10 mL
  Filled 2023-06-10: qty 10

## 2023-06-10 MED ORDER — QUETIAPINE FUMARATE 25 MG PO TABS
25.0000 mg | ORAL_TABLET | Freq: Every day | ORAL | Status: DC
  Administered 2023-06-10 – 2023-06-11 (×3): 25 mg via ORAL
  Filled 2023-06-10 (×3): qty 1

## 2023-06-10 MED ORDER — ENSURE ENLIVE PO LIQD
237.0000 mL | Freq: Two times a day (BID) | ORAL | Status: DC
  Administered 2023-06-10 – 2023-06-12 (×4): 237 mL via ORAL

## 2023-06-10 NOTE — Plan of Care (Signed)
  Problem: Education: Goal: Knowledge of General Education information will improve Description: Including pain rating scale, medication(s)/side effects and non-pharmacologic comfort measures Outcome: Progressing   Problem: Health Behavior/Discharge Planning: Goal: Ability to manage health-related needs will improve Outcome: Progressing   Problem: Clinical Measurements: Goal: Respiratory complications will improve Outcome: Progressing   Problem: Clinical Measurements: Goal: Cardiovascular complication will be avoided Outcome: Progressing   Problem: Nutrition: Goal: Adequate nutrition will be maintained Outcome: Progressing   Problem: Coping: Goal: Level of anxiety will decrease Outcome: Progressing   Problem: Pain Managment: Goal: General experience of comfort will improve and/or be controlled Outcome: Progressing   Problem: Safety: Goal: Ability to remain free from injury will improve Outcome: Progressing   Problem: Elimination: Goal: Will not experience complications related to bowel motility Outcome: Progressing Goal: Will not experience complications related to urinary retention Outcome: Progressing   Problem: Skin Integrity: Goal: Risk for impaired skin integrity will decrease Outcome: Progressing   Problem: Safety: Goal: Non-violent Restraint(s) Outcome: Progressing

## 2023-06-10 NOTE — Progress Notes (Signed)
SATURATION QUALIFICATIONS: (This note is used to comply with regulatory documentation for home oxygen)  Patient Saturations on Room Air at Rest = 92%  Patient Saturations on Room Air while Ambulating = 84%  Patient Saturations on 3 Liters of oxygen while Ambulating = 95%

## 2023-06-10 NOTE — Progress Notes (Signed)
   06/10/23 1535  Mobility  Activity Ambulated with assistance to bathroom  Level of Assistance Minimal assist, patient does 75% or more  Assistive Device Front wheel walker  Distance Ambulated (ft) 10 ft  Activity Response Tolerated fair  Mobility Referral Yes  Mobility visit 1 Mobility  Mobility Specialist Start Time (ACUTE ONLY) 1530  Mobility Specialist Stop Time (ACUTE ONLY) 1535  Mobility Specialist Time Calculation (min) (ACUTE ONLY) 5 min   Mobility Specialist: Progress Note  Pt agreeable to mobility session - responded to bed alarm - received sitting EOB. C/o BR urgency needing to have a BM. Returned to [facility] with all needs met - RN present in room.   Barnie Mort, BS Mobility Specialist Please contact via SecureChat or  Rehab office at [phone].

## 2023-06-10 NOTE — Progress Notes (Signed)
TRH night cross cover note:   I was notified by RN that the patient's diurnal and nocturnal agitation seems refractory to existing order for as needed IV Haldol, and that the patient's agitation seemed to respond better to an as needed dose of Geodon a few nights ago.  Additionally, RN conveys that the patient was recently started on Seroquel 25 mg p.o. nightly, but that the Seroquel was given early today during the afternoon hours.  As a result, I have ordered an additional dose of Seroquel 25 mg p.o. x 1 dose this evening, and confirmed the timing of the patient's QHS Seroquel moving forward as being scheduled for 2200.     Newton Pigg, DO Hospitalist

## 2023-06-10 NOTE — Progress Notes (Signed)
   06/09/23 2000 06/09/23 2016  Assess: MEWS Score  Temp 97.7 F (36.5 C)  --   BP (!) 144/92  --   MAP (mmHg) 110  --   Pulse Rate (!) 108 (!) 135  Resp (!) 22  --   Level of Consciousness  --  Alert  SpO2 95 % 93 %  O2 Device Nasal Cannula Nasal Cannula  O2 Flow Rate (L/min) 3.5 L/min 4 L/min  Assess: MEWS Score  MEWS Temp 0 0  MEWS Systolic 0 0  MEWS Pulse 1 3  MEWS RR 1 1  MEWS LOC 0 0  MEWS Score 2 4  MEWS Score Color Yellow Red  Assess: if the MEWS score is Yellow or Red  Were vital signs accurate and taken at a resting state?  --  No, vital signs rechecked  Does the patient meet 2 or more of the SIRS criteria?  --  Yes  Does the patient have a confirmed or suspected source of infection?  --  Yes  MEWS guidelines implemented   --  Yes, yellow  Treat  MEWS Interventions  --  Considered administering scheduled or prn medications/treatments as ordered  Take Vital Signs  Increase Vital Sign Frequency   --  Yellow: Q2hr x1, continue Q4hrs until patient remains green for 12hrs  Escalate  MEWS: Escalate  --  Yellow: Discuss with charge nurse and consider notifying provider and/or RRT  Provider Notification  Provider Name/Title  --  Dr. Newton Pigg  Date Provider Notified  --  06/09/23  Time Provider Notified  --  2036  Method of Notification  --  Page  Notification Reason  --  Change in status (Increased wheezing/HR. RRT rec steroid after nebs. HIgh HR d/t pt trying to get OOB for bathroom)  Provider response  --  See new orders  Assess: SIRS CRITERIA  SIRS Temperature  0 0  SIRS Respirations  1 1  SIRS Pulse 1 1  SIRS WBC 0 0  SIRS Score Sum  2 2   Pt had increasing wheeze, pt denied CP/SOB at rest, just with activity. Pt HR would elevate as she would try to get out of bed. Pt is confused and frequently attempting to get up. Telesitter is bedside, pt is redirectable.

## 2023-06-10 NOTE — TOC Progression Note (Addendum)
Transition of Care [facility]) - Progression Note    Patient Details  Name: Angelica Kelley MRN: [id number] Date of Birth: 1945/10/17  [facility]) CM/SW Contact  Mearl Latin, LCSW Phone Number: 06/10/2023, 12:36 PM  Clinical Narrative:    CSW spoke with patient's brother who directed CSW to contact her niece, Angelica Kelley. CSW spoke with Angelica Kelley and provided update. She reported that she is planning on picking patient up at discharge so CSW will update her tomorrow morning. CSW requested RN evaluate patient for need for oxygen.   CSW spoke with [facility] and asked which oxygen provider they prefer but their RN Briscoe Deutscher will not be in to ask until tomorrow morning. CSW will follow up.     Expected Discharge Plan: Assisted Living Barriers to Discharge: Continued Medical Work up  Expected Discharge Plan and Services In-house Referral: Clinical Social Work   Post Acute Care Choice: Home Health Living arrangements for the past 2 months: Assisted Living Facility                                       Social Determinants of Health (SDOH) Interventions SDOH Screenings   Food Insecurity: Patient Unable To Answer (06/08/2023)  Housing: Patient Unable To Answer (06/08/2023)  Transportation Needs: Patient Unable To Answer (06/08/2023)  Utilities: Patient Unable To Answer (06/08/2023)  Social Connections: Unknown (06/08/2023)  Tobacco Use: Medium Risk (06/06/2023)    Readmission Risk Interventions     No data to display

## 2023-06-10 NOTE — Progress Notes (Signed)
PROGRESS NOTE        PATIENT DETAILS Name: Angelica Kelley Age: 78 y.o. Sex: female Date of Birth: Feb 08, 1946 Admit Date: 06/06/2023 Admitting Physician Angie Fava, DO JSE:[id number], [employer]  Brief Summary: Patient is a 78 y.o.  female with history of dementia, HTN, HLD, aortic stenosis, PAF not on anticoagulation, depression/anxiety-who presented with shortness of breath-found to have acute hypoxic respiratory failure due to RSV bronchitis.  Significant events: 1/23>> to ED from ALF-SOB/hypoxic-RSV+ve  Significant studies: 1/23>> CXR: No acute findings-chronic interstitial changes.  Significant microbiology data: 1/23>> RSV PCR: Positive 1/23>> COVID/influenza PCR: Negative  Procedures: None  Consults: None  Subjective:  Patient in bed, appears comfortable, denies any headache, no fever, no chest pain or pressure, no shortness of breath , no abdominal pain. No new focal weakness.  Objective: Vitals: Blood pressure (!) 155/85, pulse (!) 116, temperature 98.5 F (36.9 C), temperature source Oral, resp. rate 16, weight 74 kg, SpO2 98%.   Exam:  Awake but remains pleasantly confused, No new F.N deficits, Normal affect [place].AT,PERRAL Supple Neck, No JVD,   Symmetrical Chest wall movement, Good air movement bilaterally, CTAB RRR,No Gallops, Rubs or new Murmurs,  +ve B.Sounds, Abd Soft, No tenderness,   No Cyanosis, Clubbing or edema    Assessment/Plan:  Acute hypoxic respiratory failure due to RSV bronchitis Was placed on combination of steroids, bronchodilators and mucolytics along with oxygen and as needed Lasix, initially showed improvement however early morning of 06/09/2023 worsened respiratory status with increased breathing and shortness of breath, she was continued on steroids, rec given a dose of IV Lasix, much improved after diuretics, wheezing has improved, start tapering down steroids, as needed Lasix, encouraged to sit in  chair use I-S and flutter valve.  Advance activity titrate down oxygen.  SpO2: 98 % O2 Flow Rate (L/min): 3 L/min   PAF with intermittent RVR Rate controlled-will attempt to discontinue Cardizem infusion (not on any rate control agents at home) Per prior documentation-not on chronic anticoagulation due to history of fall/GI bleed/subdural hematoma As needed metoprolol.  Microcytic anemia Hb not far from prior baseline No evidence of GI bleeding Iron panel consistent with iron deficiency Placed on oral iron supplementation  HLD Statin  Constipation predominant IBS Resume laxatives  Hypertension.  Placed on Norvasc and as needed hydralazine   Dementia At risk for delirium Maintain delirium precautions  Depression/anxiety Zoloft As needed Klonopin  BMI: Estimated body mass index is 25.4 kg/m as calculated from the following:   Height as of 10/18/20: 5' 7.2" (1.707 m).   Weight as of this encounter: 74 kg.   Code status:   Code Status: Limited: Do not attempt resuscitation (DNR) -DNR-LIMITED -Do Not Intubate/DNI -this was confirmed with niece Almyra Free over the phone by this MD.  No intubation no CPR but otherwise continue to treat with antibiotics/steroids etc.  DVT Prophylaxis: enoxaparin (LOVENOX) injection 40 mg Start: 06/07/23 1000   Family Communication: Niece-[name] [name]-[phone] -updated 06/09/2023   Disposition Plan: Status is: Inpatient Remains inpatient appropriate because: Severity of illness   Planned Discharge Destination:Assisted living   Diet: Diet Order             Diet Heart Fluid consistency: Thin  Diet effective now                   MEDICATIONS: Scheduled Meds:  amLODipine  10 mg Oral Daily   arformoterol  15 mcg Nebulization BID   budesonide (PULMICORT) nebulizer solution  0.25 mg Nebulization BID   cholecalciferol  1,000 Units Oral Daily   donepezil  10 mg Oral QHS   enoxaparin (LOVENOX) injection  40 mg Subcutaneous Q24H    feeding supplement  237 mL Oral BID BM   ferrous sulfate  325 mg Oral Q breakfast   furosemide  40 mg Intravenous Once   gabapentin  200 mg Oral BID   guaiFENesin  600 mg Oral BID   levalbuterol  0.63 mg Nebulization TID   And   ipratropium  0.5 mg Nebulization TID   loratadine  10 mg Oral Daily   [START ON 06/11/2023] methylPREDNISolone (SOLU-MEDROL) injection  40 mg Intravenous Daily   pantoprazole  40 mg Oral BID   polyethylene glycol  17 g Oral Daily   rosuvastatin  40 mg Oral Daily   senna-docusate  1 tablet Oral QHS   sertraline  200 mg Oral q AM   sodium chloride flush  3 mL Intravenous Q12H   thiamine  100 mg Oral Daily   Continuous Infusions:  azithromycin 250 mL/hr at 06/10/23 0120   cefTRIAXone (ROCEPHIN)  IV Stopped (06/09/23 2334)    PRN Meds:.acetaminophen **OR** acetaminophen, benzonatate, hydrALAZINE, ipratropium-albuterol, melatonin, metoprolol tartrate, [DISCONTINUED] ondansetron **OR** ondansetron (ZOFRAN) IV   I have personally reviewed following labs and imaging studies  LABORATORY DATA: Recent Labs  Lab 06/06/23 1123 06/07/23 0505 06/09/23 0756 06/10/23 0441  WBC 5.0 4.4 10.1 9.3  HGB 9.4* 9.1* 10.8* 11.5*  HCT 32.5* 31.2* 35.6* 38.9  PLT 165 150 237 253  MCV 76.7* 75.9* 72.1* 73.8*  MCH 22.2* 22.1* 21.9* 21.8*  MCHC 28.9* 29.2* 30.3 29.6*  RDW 16.9* 17.0* 17.0* 17.0*  LYMPHSABS  --   --  0.9 0.6*  MONOABS  --   --  0.5 0.4  EOSABS  --   --  0.0 0.0  BASOSABS  --   --  0.0 0.0    Recent Labs  Lab 06/06/23 1123 06/07/23 0505 06/09/23 0756 06/09/23 2254 06/10/23 0441  NA 141 143 137  --  138  K 3.4* 4.0 3.0*  --  4.3  CL 106 107 99  --  101  CO2 27 27 24   --  23  ANIONGAP 8 9 14   --  14  GLUCOSE 117* 125* 120*  --  142*  BUN 10 11 20   --  25*  CREATININE 0.74 0.70 0.88  --  0.77  AST 22  --   --   --   --   ALT 13  --   --   --   --   ALKPHOS 50  --   --   --   --   BILITOT 0.7  --   --   --   --   ALBUMIN 3.4*  --   --   --   --    CRP  --   --  0.6  --  0.5  PROCALCITON  --   --  <0.10  --  <0.10  BNP 195.9*  --  133.6*  --  95.7  MG  --   --  2.1 2.4 2.5*  PHOS  --   --  4.5  --  5.2*  CALCIUM 8.6* 8.6* 9.1  --  9.1    Lab Results  Component Value Date   CHOL 174 08/07/2017  HDL 70 08/07/2017   LDLCALC 83 08/07/2017   TRIG 113 08/07/2017   CHOLHDL 2.5 08/07/2017      Recent Labs  Lab 06/06/23 1123 06/07/23 0505 06/09/23 0756 06/09/23 2254 06/10/23 0441  CRP  --   --  0.6  --  0.5  PROCALCITON  --   --  <0.10  --  <0.10  BNP 195.9*  --  133.6*  --  95.7  MG  --   --  2.1 2.4 2.5*  CALCIUM 8.6* 8.6* 9.1  --  9.1   Anemia Panel: No results for input(s): "VITAMINB12", "FOLATE", "FERRITIN", "TIBC", "IRON", "RETICCTPCT" in the last 72 hours.   Urine analysis:    Component Value Date/Time   COLORURINE YELLOW 06/08/2023 0100   APPEARANCEUR CLEAR 06/08/2023 0100   LABSPEC 1.010 06/08/2023 0100   PHURINE 5.0 06/08/2023 0100   GLUCOSEU NEGATIVE 06/08/2023 0100   HGBUR SMALL (A) 06/08/2023 0100   BILIRUBINUR NEGATIVE 06/08/2023 0100   KETONESUR NEGATIVE 06/08/2023 0100   PROTEINUR 30 (A) 06/08/2023 0100   UROBILINOGEN 1 06/09/2014 1531   NITRITE NEGATIVE 06/08/2023 0100   LEUKOCYTESUR NEGATIVE 06/08/2023 0100    Sepsis Labs: Lactic Acid, Venous    Component Value Date/Time   LATICACIDVEN 1.6 10/18/2020 1300    MICROBIOLOGY: Recent Results (from the past 240 hours)  Resp panel by RT-PCR (RSV, Flu A&B, Covid)     Status: Abnormal   Collection Time: 06/06/23 12:15 PM  Result Value Ref Range Status   SARS Coronavirus 2 by RT PCR NEGATIVE NEGATIVE Final   Influenza A by PCR NEGATIVE NEGATIVE Final   Influenza B by PCR NEGATIVE NEGATIVE Final    Comment: (NOTE) The Xpert Xpress SARS-CoV-2/FLU/RSV plus assay is intended as an aid in the diagnosis of influenza from Nasopharyngeal swab specimens and should not be used as a sole basis for treatment. Nasal washings and aspirates are  unacceptable for Xpert Xpress SARS-CoV-2/FLU/RSV testing.  Fact Sheet for Patients: BloggerCourse.com  Fact Sheet for Healthcare Providers: SeriousBroker.it  This test is not yet approved or cleared by the [place] FDA and has been authorized for detection and/or diagnosis of SARS-CoV-2 by FDA under an Emergency Use Authorization (EUA). This EUA will remain in effect (meaning this test can be used) for the duration of the COVID-19 declaration under Section 564(b)(1) of the Act, 21 U.S.C. section 360bbb-3(b)(1), unless the authorization is terminated or revoked.     Resp Syncytial Virus by PCR POSITIVE (A) NEGATIVE Final    Comment: (NOTE) Fact Sheet for Patients: BloggerCourse.com  Fact Sheet for Healthcare Providers: SeriousBroker.it  This test is not yet approved or cleared by the [place] FDA and has been authorized for detection and/or diagnosis of SARS-CoV-2 by FDA under an Emergency Use Authorization (EUA). This EUA will remain in effect (meaning this test can be used) for the duration of the COVID-19 declaration under Section 564(b)(1) of the Act, 21 U.S.C. section 360bbb-3(b)(1), unless the authorization is terminated or revoked.  Performed at [facility], [address] [place], [place] [address]   Culture, blood (Routine X 2) w Reflex to ID Panel     Status: None (Preliminary result)   Collection Time: 06/09/23 10:53 PM   Specimen: BLOOD LEFT HAND  Result Value Ref Range Status   Specimen Description BLOOD LEFT HAND  Final   Special Requests   Final    BOTTLES DRAWN AEROBIC AND ANAEROBIC Blood Culture results may not be optimal due to an inadequate  volume of blood received in culture bottles   Culture   Final    NO GROWTH < 12 HOURS Performed at [facility], [address], [place], [place] [address]    Report Status PENDING  Incomplete   Culture, blood (Routine X 2) w Reflex to ID Panel     Status: None (Preliminary result)   Collection Time: 06/09/23 10:53 PM   Specimen: BLOOD LEFT HAND  Result Value Ref Range Status   Specimen Description BLOOD LEFT HAND  Final   Special Requests   Final    BOTTLES DRAWN AEROBIC ONLY Blood Culture results may not be optimal due to an inadequate volume of blood received in culture bottles   Culture   Final    NO GROWTH < 12 HOURS Performed at [facility], [address], [place], [place] [address]    Report Status PENDING  Incomplete    RADIOLOGY STUDIES/RESULTS: DG Chest Port 1 View Result Date: 06/09/2023 CLINICAL DATA:  Wheezing. EXAM: PORTABLE CHEST 1 VIEW COMPARISON:  06/06/2023 FINDINGS: The cardio pericardial silhouette is enlarged. Diffuse interstitial opacity again noted with new patchy airspace disease in the right lung base, suspicious for pneumonia. No substantial pleural effusion. No acute bony abnormality. IMPRESSION: Chronic interstitial lung disease with new subtle areas of patchy airspace disease at the right base, suspicious for pneumonia. Electronically Signed   By: Kennith Center M.D.   On: 06/09/2023 11:06      LOS: 4 days   Signature  -    Susa Raring M.D on 06/10/2023 at 9:31 AM   -  To page go to [email or web address]

## 2023-06-11 DIAGNOSIS — J210 Acute bronchiolitis due to respiratory syncytial virus: Secondary | ICD-10-CM | POA: Diagnosis not present

## 2023-06-11 MED ORDER — FUROSEMIDE 10 MG/ML IJ SOLN
40.0000 mg | Freq: Once | INTRAMUSCULAR | Status: AC
  Administered 2023-06-11: 40 mg via INTRAVENOUS
  Filled 2023-06-11: qty 4

## 2023-06-11 MED ORDER — CARVEDILOL 3.125 MG PO TABS
3.1250 mg | ORAL_TABLET | Freq: Two times a day (BID) | ORAL | Status: DC
  Administered 2023-06-11 – 2023-06-12 (×3): 3.125 mg via ORAL
  Filled 2023-06-11 (×3): qty 1

## 2023-06-11 NOTE — Progress Notes (Signed)
Physical Therapy Treatment Patient Details Name: Angelica Kelley MRN: [id number] DOB: 1945-09-29 Today's Date: 06/11/2023   History of Present Illness Pt is a 78 yo female presenting to [facility] ED on 06/06/23 for the evaluation of SOB and hypoxia being worked up for RSV. PMH of PAF not on anticoagulation, HTN, HLD, moderate aortic stenosis, depression/anxiety    PT Comments  Pt received in supine and agreeable to session. Pt reports need to use the bathroom at beginning of session and is able to ambulate with up to CGA for safety. Pt able to void and assist with pericare. Pt requires increased assist to stand from low toilet. Pt demonstrates impaired cognition requiring increased cues throughout session. Pt states she needs to have a BM while sitting on the toilet and then states she can't. Pt instructed in how to call the nurse, but pt unable to recall 1 min later. Pt ambulates on RA, but unable to obtain stable O2 pleth during mobility. Pt demonstrates increased DOE and wheezing at end of session. Pt continues to benefit from PT services to progress toward functional mobility goals.    If plan is discharge home, recommend the following: A little help with walking and/or transfers;A little help with bathing/dressing/bathroom;Assistance with cooking/housework;Assist for transportation;Help with stairs or ramp for entrance;Supervision due to cognitive status   Can travel by private vehicle        Equipment Recommendations  None recommended by PT    Recommendations for Other Services       Precautions / Restrictions Precautions Precautions: Fall Precaution Comments: RSV Restrictions Weight Bearing Restrictions Per Provider Order: No     Mobility  Bed Mobility Overal bed mobility: Needs Assistance Bed Mobility: Supine to Sit     Supine to sit: Contact guard     General bed mobility comments: increased time and cues for technique. Increased difficulty scooting forward to EOB, but no physical  assist needed    Transfers Overall transfer level: Needs assistance Equipment used: Rolling walker (2 wheels) Transfers: Sit to/from Stand Sit to Stand: Min assist, Mod assist           General transfer comment: From EOB with min A and low toilet with mod A for power up    Ambulation/Gait Ambulation/Gait assistance: Contact guard assist Gait Distance (Feet): 10 Feet (+25) Assistive device: Rolling walker (2 wheels) Gait Pattern/deviations: Step-through pattern Gait velocity: reduced     General Gait Details: slow gait with CGA for safety with obstacle negotiation around room. Deferred further ambulation due to difficulty obtaining stable O2 sat reading       Balance Overall balance assessment: Needs assistance Sitting-balance support: No upper extremity supported, Feet supported Sitting balance-Leahy Scale: Good Sitting balance - Comments: sitting EOB   Standing balance support: During functional activity, Bilateral upper extremity supported Standing balance-Leahy Scale: Fair Standing balance comment: with RW support                            Cognition Arousal: Alert Behavior During Therapy: WFL for tasks assessed/performed Overall Cognitive Status: History of cognitive impairments - at baseline                                 General Comments: Poor memory requiring increased cues and redirection        Exercises      General Comments General comments (skin integrity, edema,  etc.): Pt on 2L upon entry with SpO2 at 97%. Pt able to ambulate to the bathroom on RA, but unable to obtain stable O2 pleth during ambulation. SpO2 88% while sitting on toilet and 92% while sitting in recliner after short gait trial in the room. Pt noted to demonstrate DOE and wheezing at end of session. Pt left on 1L due to difficulty obtaining stable pleth, RN notified.      Pertinent Vitals/Pain Pain Assessment Pain Assessment: No/denies pain     PT  Goals (current goals can now be found in the care plan section) Acute Rehab PT Goals Patient Stated Goal: to return to [facility] PT Goal Formulation: With patient Time For Goal Achievement: 06/21/23 Progress towards PT goals: Progressing toward goals    Frequency    Min 1X/week       AM-PAC PT "6 Clicks" Mobility   Outcome Measure  Help needed turning from your back to your side while in a flat bed without using bedrails?: A Little Help needed moving from lying on your back to sitting on the side of a flat bed without using bedrails?: A Little Help needed moving to and from a bed to a chair (including a wheelchair)?: A Little Help needed standing up from a chair using your arms (e.g., wheelchair or bedside chair)?: A Little Help needed to walk in hospital room?: A Little Help needed climbing 3-5 steps with a railing? : A Little 6 Click Score: 18    End of Session Equipment Utilized During Treatment: Gait belt Activity Tolerance: Treatment limited secondary to medical complications (Comment) Patient left: in chair;with call bell/phone within reach;with chair alarm set Nurse Communication: Mobility status PT Visit Diagnosis: Other abnormalities of gait and mobility (R26.89)     Time: 6045-4098 PT Time Calculation (min) (ACUTE ONLY): 33 min  Charges:    $Gait Training: 8-22 mins $Therapeutic Activity: 8-22 mins PT General Charges $$ ACUTE PT VISIT: 1 Visit                     Johny Shock, PTA Acute Rehabilitation Services Secure Chat Preferred  [phone]    Johny Shock 06/11/2023, 10:40 AM

## 2023-06-11 NOTE — TOC Progression Note (Signed)
Transition of Care [facility]) - Progression Note    Patient Details  Name: Angelica Kelley MRN: [id number] Date of Birth: [birth year]  Transition of Care [facility]) CM/SW Contact  Gordy Clement, RN Phone Number: 06/11/2023, 1:15 PM  Clinical Narrative:    Patient requires O2 on DC. [employer] will provide.  Portable has been delivered bedside and home set up will go to [facility]. TOC will continue to follow patient for any additional discharge needs       Expected Discharge Plan: Assisted Living Barriers to Discharge: Continued Medical Work up  Expected Discharge Plan and Services In-house Referral: Clinical Social Work   Post Acute Care Choice: Home Health Living arrangements for the past 2 months: Assisted Living Facility                                       Social Determinants of Health (SDOH) Interventions SDOH Screenings   Food Insecurity: Patient Unable To Answer (06/08/2023)  Housing: Patient Unable To Answer (06/08/2023)  Transportation Needs: Patient Unable To Answer (06/08/2023)  Utilities: Patient Unable To Answer (06/08/2023)  Social Connections: Unknown (06/08/2023)  Tobacco Use: Medium Risk (06/06/2023)    Readmission Risk Interventions     No data to display

## 2023-06-11 NOTE — TOC Progression Note (Signed)
Transition of Care [facility]) - Progression Note    Patient Details  Name: Angelica Kelley MRN: [id number] Date of Birth: August 07, 1945  Transition of Care [facility]) CM/SW Contact  Mearl Latin, LCSW Phone Number: 06/11/2023, 2:33 PM  Clinical Narrative:    CSW spoke with patient's niece and provided update. She stated that oxygen was delivered to her house for some reason and she had to direct them to [facility]. CSW will keep her apprised of discharge readiness for transport.    Expected Discharge Plan: Assisted Living Barriers to Discharge: Continued Medical Work up  Expected Discharge Plan and Services In-house Referral: Clinical Social Work   Post Acute Care Choice: Home Health Living arrangements for the past 2 months: Assisted Living Facility                                       Social Determinants of Health (SDOH) Interventions SDOH Screenings   Food Insecurity: Patient Unable To Answer (06/08/2023)  Housing: Patient Unable To Answer (06/08/2023)  Transportation Needs: Patient Unable To Answer (06/08/2023)  Utilities: Patient Unable To Answer (06/08/2023)  Social Connections: Unknown (06/08/2023)  Tobacco Use: Medium Risk (06/06/2023)    Readmission Risk Interventions     No data to display

## 2023-06-11 NOTE — Progress Notes (Signed)
PROGRESS NOTE        PATIENT DETAILS Name: Angelica Kelley Age: 78 y.o. Sex: female Date of Birth: April 14, 1946 Admit Date: 06/06/2023 Admitting Physician Angie Fava, DO ZOX:[id number], [facility]  Brief Summary: Patient is a 78 y.o.  female with history of dementia, HTN, HLD, aortic stenosis, PAF not on anticoagulation, depression/anxiety-who presented with shortness of breath-found to have acute hypoxic respiratory failure due to RSV bronchitis.  Significant events: 1/23>> to ED from ALF-SOB/hypoxic-RSV+ve  Significant studies: 1/23>> CXR: No acute findings-chronic interstitial changes.  Significant microbiology data: 1/23>> RSV PCR: Positive 1/23>> COVID/influenza PCR: Negative  Procedures: None  Consults: None  Subjective: Patient remains in bed, in no distress, pleasantly confused but not aggressive, denies any headache or chest pain, mild cough but no shortness of breath.  No abdominal pain.  Objective: Vitals: Blood pressure (!) 136/91, pulse 89, temperature 97.6 F (36.4 C), temperature source Axillary, resp. rate 16, weight 72.3 kg, SpO2 100%.   Exam:  Awake but remains pleasantly confused, No new F.N deficits, Normal affect .AT,PERRAL Supple Neck, No JVD,   Symmetrical Chest wall movement, Good air movement bilaterally, coarse breath sounds RRR,No Gallops, Rubs or new Murmurs,  +ve B.Sounds, Abd Soft, No tenderness,   No Cyanosis, Clubbing or edema    Assessment/Plan:  Acute hypoxic respiratory failure due to RSV bronchitis Was placed on combination of steroids, bronchodilators and mucolytics along with oxygen and as needed Lasix, initially showed improvement however early morning of 06/09/2023 worsened respiratory status with increased breathing and shortness of breath, she was continued on steroids, rec given a dose of IV Lasix, much improved after diuretics, wheezing has improved, start tapering down steroids, as needed  Lasix, encouraged to sit in chair use I-S and flutter valve.  Advance activity titrate down oxygen.  SpO2: 100 % O2 Flow Rate (L/min): 2 L/min   PAF with intermittent RVR Usually required Cardizem infusion, will place on oral beta-blocker now for better blood pressure and rate control.  Microcytic anemia Hb not far from prior baseline No evidence of GI bleeding Iron panel consistent with iron deficiency Placed on oral iron supplementation  HLD Statin  Constipation predominant IBS Resume laxatives  Hypertension.  Placed on Norvasc, add low-dose beta-blocker for better control along with  as needed hydralazine   Dementia At risk for delirium Maintain delirium precautions  Depression/anxiety Zoloft As needed Klonopin  BMI: Estimated body mass index is 24.82 kg/m as calculated from the following:   Height as of 10/18/20: 5' 7.2" (1.707 m).   Weight as of this encounter: 72.3 kg.   Code status:   Code Status: Limited: Do not attempt resuscitation (DNR) -DNR-LIMITED -Do Not Intubate/DNI -this was confirmed with niece Almyra Free over the phone by this MD.  No intubation no CPR but otherwise continue to treat with antibiotics/steroids etc.  DVT Prophylaxis: enoxaparin (LOVENOX) injection 40 mg Start: 06/07/23 1000   Family Communication: Niece-[name] [name]-[phone] -updated 06/09/2023, 06/11/2023   Disposition Plan: Status is: Inpatient Remains inpatient appropriate because: Severity of illness   Planned Discharge Destination:Assisted living   Diet: Diet Order             Diet Heart Fluid consistency: Thin  Diet effective now                   MEDICATIONS: Scheduled Meds:  amLODipine  10  mg Oral Daily   arformoterol  15 mcg Nebulization BID   budesonide (PULMICORT) nebulizer solution  0.25 mg Nebulization BID   carvedilol  3.125 mg Oral BID WC   cholecalciferol  1,000 Units Oral Daily   donepezil  10 mg Oral QHS   enoxaparin (LOVENOX) injection  40 mg  Subcutaneous Q24H   feeding supplement  237 mL Oral BID BM   ferrous sulfate  325 mg Oral Q breakfast   furosemide  40 mg Intravenous Once   gabapentin  200 mg Oral BID   guaiFENesin  600 mg Oral BID   levalbuterol  0.63 mg Nebulization TID   And   ipratropium  0.5 mg Nebulization TID   loratadine  10 mg Oral Daily   methylPREDNISolone (SOLU-MEDROL) injection  40 mg Intravenous Daily   pantoprazole  40 mg Oral BID   polyethylene glycol  17 g Oral Daily   QUEtiapine  25 mg Oral QHS   rosuvastatin  40 mg Oral Daily   senna-docusate  1 tablet Oral QHS   sertraline  200 mg Oral q AM   sodium chloride flush  3 mL Intravenous Q12H   thiamine  100 mg Oral Daily   Continuous Infusions:  azithromycin 500 mg (06/10/23 2209)   cefTRIAXone (ROCEPHIN)  IV 1 g (06/10/23 2328)    PRN Meds:.acetaminophen **OR** acetaminophen, benzonatate, hydrALAZINE, ipratropium-albuterol, melatonin, metoprolol tartrate, [DISCONTINUED] ondansetron **OR** ondansetron (ZOFRAN) IV   I have personally reviewed following labs and imaging studies  LABORATORY DATA: Recent Labs  Lab 06/06/23 1123 06/07/23 0505 06/09/23 0756 06/10/23 0441  WBC 5.0 4.4 10.1 9.3  HGB 9.4* 9.1* 10.8* 11.5*  HCT 32.5* 31.2* 35.6* 38.9  PLT 165 150 237 253  MCV 76.7* 75.9* 72.1* 73.8*  MCH 22.2* 22.1* 21.9* 21.8*  MCHC 28.9* 29.2* 30.3 29.6*  RDW 16.9* 17.0* 17.0* 17.0*  LYMPHSABS  --   --  0.9 0.6*  MONOABS  --   --  0.5 0.4  EOSABS  --   --  0.0 0.0  BASOSABS  --   --  0.0 0.0    Recent Labs  Lab 06/06/23 1123 06/07/23 0505 06/09/23 0756 06/09/23 2254 06/10/23 0441  NA 141 143 137  --  138  K 3.4* 4.0 3.0*  --  4.3  CL 106 107 99  --  101  CO2 27 27 24   --  23  ANIONGAP 8 9 14   --  14  GLUCOSE 117* 125* 120*  --  142*  BUN 10 11 20   --  25*  CREATININE 0.74 0.70 0.88  --  0.77  AST 22  --   --   --   --   ALT 13  --   --   --   --   ALKPHOS 50  --   --   --   --   BILITOT 0.7  --   --   --   --   ALBUMIN  3.4*  --   --   --   --   CRP  --   --  0.6  --  0.5  PROCALCITON  --   --  <0.10  --  <0.10  BNP 195.9*  --  133.6*  --  95.7  MG  --   --  2.1 2.4 2.5*  PHOS  --   --  4.5  --  5.2*  CALCIUM 8.6* 8.6* 9.1  --  9.1    Lab Results  Component Value Date   CHOL 174 08/07/2017   HDL 70 08/07/2017   LDLCALC 83 08/07/2017   TRIG 113 08/07/2017   CHOLHDL 2.5 08/07/2017      Recent Labs  Lab 06/06/23 1123 06/07/23 0505 06/09/23 0756 06/09/23 2254 06/10/23 0441  CRP  --   --  0.6  --  0.5  PROCALCITON  --   --  <0.10  --  <0.10  BNP 195.9*  --  133.6*  --  95.7  MG  --   --  2.1 2.4 2.5*  CALCIUM 8.6* 8.6* 9.1  --  9.1   Anemia Panel: No results for input(s): "VITAMINB12", "FOLATE", "FERRITIN", "TIBC", "IRON", "RETICCTPCT" in the last 72 hours.   Urine analysis:    Component Value Date/Time   COLORURINE YELLOW 06/08/2023 0100   APPEARANCEUR CLEAR 06/08/2023 0100   LABSPEC 1.010 06/08/2023 0100   PHURINE 5.0 06/08/2023 0100   GLUCOSEU NEGATIVE 06/08/2023 0100   HGBUR SMALL (A) 06/08/2023 0100   BILIRUBINUR NEGATIVE 06/08/2023 0100   KETONESUR NEGATIVE 06/08/2023 0100   PROTEINUR 30 (A) 06/08/2023 0100   UROBILINOGEN 1 06/09/2014 1531   NITRITE NEGATIVE 06/08/2023 0100   LEUKOCYTESUR NEGATIVE 06/08/2023 0100    Sepsis Labs: Lactic Acid, Venous    Component Value Date/Time   LATICACIDVEN 1.6 10/18/2020 1300    MICROBIOLOGY: Recent Results (from the past 240 hours)  Resp panel by RT-PCR (RSV, Flu A&B, Covid)     Status: Abnormal   Collection Time: 06/06/23 12:15 PM  Result Value Ref Range Status   SARS Coronavirus 2 by RT PCR NEGATIVE NEGATIVE Final   Influenza A by PCR NEGATIVE NEGATIVE Final   Influenza B by PCR NEGATIVE NEGATIVE Final    Comment: (NOTE) The Xpert Xpress SARS-CoV-2/FLU/RSV plus assay is intended as an aid in the diagnosis of influenza from Nasopharyngeal swab specimens and should not be used as a sole basis for treatment. Nasal washings  and aspirates are unacceptable for Xpert Xpress SARS-CoV-2/FLU/RSV testing.  Fact Sheet for Patients: BloggerCourse.com  Fact Sheet for Healthcare Providers: SeriousBroker.it  This test is not yet approved or cleared by the [place] FDA and has been authorized for detection and/or diagnosis of SARS-CoV-2 by FDA under an Emergency Use Authorization (EUA). This EUA will remain in effect (meaning this test can be used) for the duration of the COVID-19 declaration under Section 564(b)(1) of the Act, 21 U.S.C. section 360bbb-3(b)(1), unless the authorization is terminated or revoked.     Resp Syncytial Virus by PCR POSITIVE (A) NEGATIVE Final    Comment: (NOTE) Fact Sheet for Patients: BloggerCourse.com  Fact Sheet for Healthcare Providers: SeriousBroker.it  This test is not yet approved or cleared by the [place] FDA and has been authorized for detection and/or diagnosis of SARS-CoV-2 by FDA under an Emergency Use Authorization (EUA). This EUA will remain in effect (meaning this test can be used) for the duration of the COVID-19 declaration under Section 564(b)(1) of the Act, 21 U.S.C. section 360bbb-3(b)(1), unless the authorization is terminated or revoked.  Performed at [facility], [address], [place], [place] [address]   Culture, blood (Routine X 2) w Reflex to ID Panel     Status: None (Preliminary result)   Collection Time: 06/09/23 10:53 PM   Specimen: BLOOD LEFT HAND  Result Value Ref Range Status   Specimen Description BLOOD LEFT HAND  Final   Special Requests   Final    BOTTLES DRAWN AEROBIC AND ANAEROBIC Blood  Culture results may not be optimal due to an inadequate volume of blood received in culture bottles   Culture   Final    NO GROWTH < 12 HOURS Performed at [facility], [address] [place], [place] [address]    Report Status  PENDING  Incomplete  Culture, blood (Routine X 2) w Reflex to ID Panel     Status: None (Preliminary result)   Collection Time: 06/09/23 10:53 PM   Specimen: BLOOD LEFT HAND  Result Value Ref Range Status   Specimen Description BLOOD LEFT HAND  Final   Special Requests   Final    BOTTLES DRAWN AEROBIC ONLY Blood Culture results may not be optimal due to an inadequate volume of blood received in culture bottles   Culture   Final    NO GROWTH < 12 HOURS Performed at [facility], [address], [place], [place] [address]    Report Status PENDING  Incomplete  MRSA Next Gen by PCR, Nasal     Status: None   Collection Time: 06/10/23  7:22 AM   Specimen: Nasal Mucosa; Nasal Swab  Result Value Ref Range Status   MRSA by PCR Next Gen NOT DETECTED NOT DETECTED Final    Comment: (NOTE) The GeneXpert MRSA Assay (FDA approved for NASAL specimens only), is one component of a comprehensive MRSA colonization surveillance program. It is not intended to diagnose MRSA infection nor to guide or monitor treatment for MRSA infections. Test performance is not FDA approved in patients less than 3 years old. Performed at [facility], [address] [place], [place] [address]     RADIOLOGY STUDIES/RESULTS: No results found.     LOS: 5 days   Signature  -    Susa Raring M.D on 06/11/2023 at 7:54 AM   -  To page go to [email or web address]

## 2023-06-11 NOTE — Plan of Care (Signed)

## 2023-06-11 NOTE — Progress Notes (Signed)
Occupational Therapy Treatment Patient Details Name: Angelica Kelley MRN: [id number] DOB: [birth year] Today's Date: 06/11/2023   History of present illness Pt is a 78 yo female presenting to [facility] ED on 06/06/23 for the evaluation of SOB and hypoxia being worked up for RSV. PMH of PAF not on anticoagulation, HTN, HLD, moderate aortic stenosis, depression/anxiety   OT comments  Pt A&Ox2, not aware of situation or the year. She ambulates with CGA overall needing some minor safety cues with RW, pt also completing ADLs with min A to setup assist. Pt overall does with cues and following commands, possesses needed cognition to process ADLs. OT to continue to follow pt acutely to help progress to next level of care. Pt is appropriate to return to ALF with increased support and supervision, also needs help with standing (mod A today). If ALF cannot provide the level of care needed then SNF would be a better alternative.       If plan is discharge home, recommend the following:  Supervision due to cognitive status;Direct supervision/assist for medications management;Direct supervision/assist for financial management;Assistance with cooking/housework;Assist for transportation   Equipment Recommendations  None recommended by OT    Recommendations for Other Services      Precautions / Restrictions Precautions Precautions: Fall Precaution Comments: RSV Restrictions Weight Bearing Restrictions Per Provider Order: No       Mobility Bed Mobility               General bed mobility comments: up in recliner on arrival    Transfers Overall transfer level: Needs assistance Equipment used: Rolling walker (2 wheels) Transfers: Sit to/from Stand Sit to Stand: Mod assist           General transfer comment: STSx3 total: toilet, recliner, transport chair     Balance Overall balance assessment: Needs assistance Sitting-balance support: No upper extremity supported, Feet supported Sitting  balance-Leahy Scale: Good Sitting balance - Comments: sitting EOB   Standing balance support: During functional activity, Bilateral upper extremity supported Standing balance-Leahy Scale: Fair Standing balance comment: with RW support                           ADL either performed or assessed with clinical judgement   ADL Overall ADL's : Needs assistance/impaired     Grooming: Standing;Oral care;Wash/dry face;Contact guard assist;Brushing hair               Lower Body Dressing: Sitting/lateral leans;Set up   Toilet Transfer: Contact guard assist;Ambulation;Rolling walker (2 wheels) Toilet Transfer Details (indicate cue type and reason): ambulates there with CGA but needs Mod A to rise Toileting- Clothing Manipulation and Hygiene: Sitting/lateral lean;Sit to/from stand;Minimal assistance Toileting - Clothing Manipulation Details (indicate cue type and reason): doff/don briefs     Functional mobility during ADLs: Contact guard assist;Rolling walker (2 wheels)      Extremity/Trunk Assessment              Vision       Perception     Praxis      Cognition Arousal: Alert Behavior During Therapy: WFL for tasks assessed/performed                                   General Comments: impaired STM, increased time and cues to follow commands        Exercises      Shoulder Instructions  General Comments VSS on 1L    Pertinent Vitals/ Pain       Pain Assessment Pain Assessment: No/denies pain  Home Living                                          Prior Functioning/Environment              Frequency  Min 1X/week        Progress Toward Goals  OT Goals(current goals can now be found in the care plan section)  Progress towards OT goals: Progressing toward goals  Acute Rehab OT Goals OT Goal Formulation: With patient Time For Goal Achievement: 06/21/23 Potential to Achieve Goals: Good  Plan       Co-evaluation                 AM-PAC OT "6 Clicks" Daily Activity     Outcome Measure   Help from another person eating meals?: None Help from another person taking care of personal grooming?: A Little Help from another person toileting, which includes using toliet, bedpan, or urinal?: A Little Help from another person bathing (including washing, rinsing, drying)?: A Little Help from another person to put on and taking off regular upper body clothing?: A Little Help from another person to put on and taking off regular lower body clothing?: A Little 6 Click Score: 19    End of Session Equipment Utilized During Treatment: Gait belt;Rolling walker (2 wheels)  OT Visit Diagnosis: Unsteadiness on feet (R26.81);Other (comment) (SOB)   Activity Tolerance Patient tolerated treatment well   Patient Left in chair;with call bell/phone within reach;with chair alarm set;Other (comment) (RT in room)   Nurse Communication Mobility status        Time: 1526-1600 OT Time Calculation (min): 34 min  Charges: OT General Charges $OT Visit: 1 Visit OT Treatments $Self Care/Home Management : 8-22 mins $Therapeutic Activity: 8-22 mins  06/11/2023  AB, OTR/L  Acute Rehabilitation Services  Office: [phone]   Tristan Schroeder 06/11/2023, 5:06 PM

## 2023-06-12 ENCOUNTER — Other Ambulatory Visit (HOSPITAL_COMMUNITY): Payer: Self-pay

## 2023-06-12 DIAGNOSIS — J210 Acute bronchiolitis due to respiratory syncytial virus: Secondary | ICD-10-CM | POA: Diagnosis not present

## 2023-06-12 MED ORDER — METHYLPREDNISOLONE 4 MG PO TBPK
ORAL_TABLET | ORAL | 0 refills | Status: AC
  Filled 2023-06-12: qty 21, 6d supply, fill #0

## 2023-06-12 MED ORDER — IPRATROPIUM-ALBUTEROL 0.5-2.5 (3) MG/3ML IN SOLN
RESPIRATORY_TRACT | 0 refills | Status: AC
  Filled 2023-06-12: qty 360, 30d supply, fill #0

## 2023-06-12 MED ORDER — QUETIAPINE FUMARATE 25 MG PO TABS
25.0000 mg | ORAL_TABLET | Freq: Every day | ORAL | 0 refills | Status: AC
  Filled 2023-06-12: qty 30, 30d supply, fill #0

## 2023-06-12 MED ORDER — AMLODIPINE BESYLATE 10 MG PO TABS
10.0000 mg | ORAL_TABLET | Freq: Every day | ORAL | 0 refills | Status: AC
  Filled 2023-06-12: qty 30, 30d supply, fill #0

## 2023-06-12 MED ORDER — CARVEDILOL 3.125 MG PO TABS
3.1250 mg | ORAL_TABLET | Freq: Two times a day (BID) | ORAL | 0 refills | Status: AC
  Filled 2023-06-12: qty 60, 30d supply, fill #0

## 2023-06-12 MED ORDER — GUAIFENESIN ER 600 MG PO TB12
600.0000 mg | ORAL_TABLET | Freq: Two times a day (BID) | ORAL | 0 refills | Status: AC | PRN
  Filled 2023-06-12: qty 20, 10d supply, fill #0

## 2023-06-12 MED ORDER — METHYLPREDNISOLONE SODIUM SUCC 40 MG IJ SOLR: 20.0000 mg | Freq: Every day | INTRAMUSCULAR | Status: DC

## 2023-06-12 NOTE — Discharge Instructions (Signed)
Follow with Primary MD [facility], [facility] in 2-3 days   Get CBC, CMP, 2 view Chest X ray -  checked next visit with your primary MD or ALF MD    Activity: As tolerated with Full fall precautions use walker/cane & assistance as needed  Disposition ALF  Diet: Heart Healthy    Special Instructions: If you have smoked or chewed Tobacco  in the last 2 yrs please stop smoking, stop any regular Alcohol  and or any Recreational drug use.  On your next visit with your primary care physician please Get Medicines reviewed and adjusted.  Please request your Prim.MD to go over all Hospital Tests and Procedure/Radiological results at the follow up, please get all Hospital records sent to your Prim MD by signing hospital release before you go home.  If you experience worsening of your admission symptoms, develop shortness of breath, life threatening emergency, suicidal or homicidal thoughts you must seek medical attention immediately by calling 911 or calling your MD immediately  if symptoms less severe.  You Must read complete instructions/literature along with all the possible adverse reactions/side effects for all the Medicines you take and that have been prescribed to you. Take any new Medicines after you have completely understood and accpet all the possible adverse reactions/side effects.

## 2023-06-12 NOTE — [place] FL2 (Signed)
[place] MEDICAID [id number] LEVEL OF CARE FORM     IDENTIFICATION  Patient Name: Angelica Kelley Birthdate: [birth year] Sex: female Admission Date (Current Location): 06/06/2023  [facility] and [place] Number:  Producer, television/film/video and Address:  [facility] [place] [facility], [address], [place], [place] [address]      Provider Number: [id number]  Attending Physician Name and Address:  Leroy Sea, MD  Relative Name and Phone Number:       Current Level of Care: Hospital Recommended Level of Care: Assisted Living Facility Prior Approval Number:    Date Approved/Denied:   PASRR Number:    Discharge Plan: Other (Comment) (ALF)    Current Diagnoses: Patient Active Problem List   Diagnosis Date Noted   Acute bronchiolitis due to respiratory syncytial virus (RSV) 06/06/2023   Acute respiratory failure with hypoxia (HCC) 06/06/2023   Diarrhea due to laxative abuse 10/18/2020   Dehydration 10/18/2020   Diarrhea of presumed infectious origin    Duodenal ulcer    Acute blood loss anemia 01/11/2019   Syncope 10/12/2017   Benzodiazepine overdose 09/07/2017   Dementia (HCC) 09/07/2017   Paroxysmal atrial fibrillation (HCC) 09/07/2017   Chronic diastolic heart failure (HCC) 09/07/2017   Microcytic anemia 09/07/2017   AKI (acute kidney injury) (HCC) 08/19/2017   Hypokalemia 08/19/2017   Depression with anxiety 05/09/2017   Fall 05/09/2017   Slurred speech 05/09/2017   CKD (chronic kidney disease) stage 2, GFR 60-89 ml/min 04/23/2017   OAB (overactive bladder) 06/09/2014   Vitamin D deficiency 10/27/2013   Medication management 10/27/2013   Hypertension    Hyperlipidemia    GERD (gastroesophageal reflux disease)    Bipolar depression (HCC)    Alcoholism (HCC)    IBS (irritable bowel syndrome)    Other abnormal glucose     Orientation RESPIRATION BLADDER Height & Weight     Self, Situation  O2 (2-3L nasal cannula) Incontinent, External catheter Weight: 159 lb  6.3 oz (72.3 kg) Height:     BEHAVIORAL SYMPTOMS/MOOD NEUROLOGICAL BOWEL NUTRITION STATUS      Continent Diet (Regular)  AMBULATORY STATUS COMMUNICATION OF NEEDS Skin   Limited Assist Verbally Normal                       Personal Care Assistance Level of Assistance  Bathing, Feeding, Dressing Bathing Assistance: Limited assistance Feeding assistance: Limited assistance Dressing Assistance: Limited assistance     Functional Limitations Info             SPECIAL CARE FACTORS FREQUENCY  PT (By licensed PT), OT (By licensed OT)     PT Frequency: Home Health PT OT Frequency: Home Health OT            Contractures Contractures Info: Not present    Additional Factors Info  Code Status, Allergies, Isolation Precautions Code Status Info: DNR Allergies Info: Lipitor (Atorvastatin), Prednisone     Isolation Precautions Info: RSV+     Current Medications (06/12/2023):    Discharge Medications:  STOP taking these medications     clonazePAM 0.5 MG tablet Commonly known as: KLONOPIN    diphenhydrAMINE 25 MG tablet Commonly known as: BENADRYL           TAKE these medications     acetaminophen 325 MG tablet Commonly known as: TYLENOL Take 650 mg by mouth every 6 (six) hours as needed for moderate pain.    amLODipine 10 MG tablet Commonly known as: NORVASC  Take 1 tablet (10 mg total) by mouth daily. Start taking on: June 13, 2023    Artificial Tears 0.1-0.3 % Soln Generic drug: Dextran 70-Hypromellose Place 1 drop into both eyes every 12 (twelve) hours as needed (for dryness).    carvedilol 3.125 MG tablet Commonly known as: COREG Take 1 tablet (3.125 mg total) by mouth 2 (two) times daily with a meal.    cholecalciferol 25 MCG (1000 UNIT) tablet Commonly known as: VITAMIN D3 Take 1,000 Units by mouth daily.    donepezil 10 MG tablet Commonly known as: ARICEPT Take 10 mg by mouth at bedtime.    gabapentin 100 MG capsule Commonly known as:  NEURONTIN Take 200 mg by mouth 2 (two) times daily.    guaiFENesin 600 MG 12 hr tablet Commonly known as: MUCINEX Take 1 tablet (600 mg total) by mouth 2 (two) times daily as needed.    ipratropium-albuterol 0.5-2.5 (3) MG/3ML Soln Commonly known as: DUONEB Use twice a day scheduled and every 6 hours as needed for shortness of breath and wheezing    levocetirizine 5 MG tablet Commonly known as: XYZAL Take 5 mg by mouth in the morning.    loperamide 2 MG capsule Commonly known as: IMODIUM Take 2 mg by mouth as needed for diarrhea or loose stools.    magnesium citrate Soln Take 150 mLs by mouth daily as needed (for contipation).    Menthol (Topical Analgesic) 4 % Gel Apply 1 application  topically 2 (two) times a day. Left shoulder pain    Biofreeze 4 % Gel Generic drug: Menthol (Topical Analgesic) Apply 1 application topically See admin instructions. Apply to the left shoulders 2 times a day    methylPREDNISolone 4 MG Tbpk tablet Commonly known as: MEDROL DOSEPAK follow package directions    pantoprazole 40 MG tablet Commonly known as: PROTONIX Take 1 tablet (40 mg total) by mouth 2 (two) times daily.    polyethylene glycol 17 g packet Commonly known as: MIRALAX / GLYCOLAX Take 17 g by mouth daily.    QUEtiapine 25 MG tablet Commonly known as: SEROQUEL Take 1 tablet (25 mg total) by mouth at bedtime.    rosuvastatin 40 MG tablet Commonly known as: CRESTOR Take 40 mg by mouth daily.    sennosides-docusate sodium 8.6-50 MG tablet Commonly known as: SENOKOT-S Take 1 tablet by mouth daily.    sertraline 100 MG tablet Commonly known as: ZOLOFT Take 200 mg by mouth in the morning.    thiamine 100 MG tablet Commonly known as: Vitamin B-1 Take 250 mg by mouth 2 (two) times a day.    zolpidem 5 MG tablet Commonly known as: AMBIEN Take 5 mg by mouth at bedtime.     Relevant Imaging Results:  Relevant Lab Results:   Additional Information SSN:  [id number]  Renne Crigler Mohmmad Saleeby, LCSW

## 2023-06-12 NOTE — Progress Notes (Signed)
Pt discharged with PTAR without incident

## 2023-06-12 NOTE — TOC Transition Note (Signed)
Transition of Care [facility]) - Discharge Note   Patient Details  Name: Angelica Kelley MRN: [id number] Date of Birth: [birth year]  Transition of Care [facility]) CM/SW Contact:  Mearl Latin, LCSW Phone Number: 06/12/2023, 1:12 PM   Clinical Narrative:    Patient will DC to: [facility] ALF Anticipated DC date: 06/12/23 Family notified: Niece, Engineer, mining by: Sharin Mons   Per MD patient ready for DC to [place]. RN to call report prior to discharge [phone]). RN, patient, patient's family, and facility notified of DC. Discharge Summary and FL2 sent to facility. DC packet on chart including signed DNR. Ambulance transport requested for patient.   CSW will sign off for now as social work intervention is no longer needed. Please consult [place] again if new needs arise.     Final next level of care: Assisted Living Barriers to Discharge: Barriers Resolved   Patient Goals and CMS Choice Patient states their goals for this hospitalization and ongoing recovery are:: Return to ALF          Discharge Placement                Patient to be transferred to facility by: PTAR Name of family member notified: Niece, Almyra Free Patient and family notified of of transfer: 06/12/23  Discharge Plan and Services Additional resources added to the After Visit Summary for   In-house Referral: Clinical Social Work   Post Acute Care Choice: Home Health                               Social Drivers of Health (SDOH) Interventions SDOH Screenings   Food Insecurity: Patient Unable To Answer (06/08/2023)  Housing: Patient Unable To Answer (06/08/2023)  Transportation Needs: Patient Unable To Answer (06/08/2023)  Utilities: Patient Unable To Answer (06/08/2023)  Social Connections: Unknown (06/08/2023)  Tobacco Use: Medium Risk (06/06/2023)     Readmission Risk Interventions     No data to display

## 2023-06-12 NOTE — Plan of Care (Signed)

## 2023-06-12 NOTE — Progress Notes (Signed)
Report given to [facility] at [facility].  All questions answered.  Awaiting PTAR for transport

## 2023-06-12 NOTE — Discharge Summary (Signed)
Angelica Kelley ZOX:[id number] DOB: 1946-01-10 DOA: 06/06/2023  PCP: Almet[facility]  Admit date: 06/06/2023  Discharge date: 06/12/2023  Admitted From: ALF   Disposition:  ALF   Recommendations for Outpatient Follow-up:   Follow up with PCP in 1-2 weeks  PCP Please obtain BMP/CBC, 2 view CXR in 1week,  (see Discharge instructions)   PCP Please follow up on the following pending results:    Home Health: PT, OT if qulifies   Equipment/Devices: as below  Consultations: None  Discharge Condition: Stable    CODE STATUS: DNR  Diet Recommendation: Heart Healthy     Chief Complaint  Patient presents with   Shortness of Breath   Cough     Brief history of present illness from the day of admission and additional interim summary    78 y.o.  female with history of dementia, HTN, HLD, aortic stenosis, PAF not on anticoagulation, depression/anxiety-who presented with shortness of breath-found to have acute hypoxic respiratory failure due to RSV bronchitis.   Significant events: 1/23>> to ED from ALF-SOB/hypoxic-RSV+ve   Significant studies: 1/23>> CXR: No acute findings-chronic interstitial changes.   Significant microbiology data: 1/23>> RSV PCR: Positive 1/23>> COVID/influenza PCR: Negative                                                                 Hospital Course   Acute hypoxic respiratory failure due to RSV bronchitis Was placed on combination of steroids, bronchodilators and mucolytics along with oxygen and as needed Lasix, improved now finished antibiotic treatment, symptom-free on 2 L nasal cannula oxygen.  Will be placed on a short course of steroid taper, home oxygen 2 L if she qualifies, home nebulizer treatment scheduled and as needed.  Discharge with outpatient PCP follow-up to her  ALF.   SpO2: 100 % O2 Flow Rate (L/min): 2 L/min     PAF with intermittent RVR, not on anticoagulation chronically Shall he required Cardizem drip now stable on oral beta-blocker.  Not on anticoagulation due to fall risk.  [place] vas 2 greater than 4.  Defer long-term anticoagulation to PCP and primary cardiologist.   Microcytic anemia Hb not far from prior baseline No evidence of GI bleeding Iron panel consistent with iron deficiency Placed on oral iron supplementation   HLD Statin   Hypertension.  Placed on Norvasc, add low-dose beta-blocker for better control, PCP to monitor and adjust.   Dementia Continue Aricept, avoid benzodiazepines and narcotics as they increase her delirium.   Depression/anxiety, hospital-acquired delirium Zoloft At bedtime seroquel - stable    Discharge diagnosis     Principal Problem:   Acute bronchiolitis due to respiratory syncytial virus (RSV) Active Problems:   Paroxysmal atrial fibrillation (HCC)   Hypokalemia   Hyperlipidemia   Depression with anxiety   Dementia (HCC)  Microcytic anemia   Acute respiratory failure with hypoxia [facility])    Discharge instructions    Discharge Instructions     Diet - low sodium heart healthy   Complete by: As directed    Discharge instructions   Complete by: As directed    Follow with Primary MD [facility] in 2-3 days   Get CBC, CMP, 2 view Chest X ray -  checked next visit with your primary MD or ALF MD    Activity: As tolerated with Full fall precautions use walker/cane & assistance as needed  Disposition ALF  Diet: Heart Healthy    Special Instructions: If you have smoked or chewed Tobacco  in the last 2 yrs please stop smoking, stop any regular Alcohol  and or any Recreational drug use.  On your next visit with your primary care physician please Get Medicines reviewed and adjusted.  Please request your Prim.MD to go over all Hospital Tests and Procedure/Radiological  results at the follow up, please get all Hospital records sent to your Prim MD by signing hospital release before you go home.  If you experience worsening of your admission symptoms, develop shortness of breath, life threatening emergency, suicidal or homicidal thoughts you must seek medical attention immediately by calling 911 or calling your MD immediately  if symptoms less severe.  You Must read complete instructions/literature along with all the possible adverse reactions/side effects for all the Medicines you take and that have been prescribed to you. Take any new Medicines after you have completely understood and accpet all the possible adverse reactions/side effects.   Increase activity slowly   Complete by: As directed        Discharge Medications   Allergies as of 06/12/2023       Reactions   Lipitor [atorvastatin]    Fatigue   Prednisone Other (See Comments)   Change in mental status        Medication List     STOP taking these medications    clonazePAM 0.5 MG tablet Commonly known as: KLONOPIN   diphenhydrAMINE 25 MG tablet Commonly known as: BENADRYL       TAKE these medications    acetaminophen 325 MG tablet Commonly known as: TYLENOL Take 650 mg by mouth every 6 (six) hours as needed for moderate pain.   amLODipine 10 MG tablet Commonly known as: NORVASC Take 1 tablet (10 mg total) by mouth daily. Start taking on: June 13, 2023   Artificial Tears 0.1-0.3 % Soln Generic drug: Dextran 70-Hypromellose Place 1 drop into both eyes every 12 (twelve) hours as needed (for dryness).   carvedilol 3.125 MG tablet Commonly known as: COREG Take 1 tablet (3.125 mg total) by mouth 2 (two) times daily with a meal.   cholecalciferol 25 MCG (1000 UNIT) tablet Commonly known as: VITAMIN D3 Take 1,000 Units by mouth daily.   donepezil 10 MG tablet Commonly known as: ARICEPT Take 10 mg by mouth at bedtime.   gabapentin 100 MG capsule Commonly known as:  NEURONTIN Take 200 mg by mouth 2 (two) times daily.   guaiFENesin 600 MG 12 hr tablet Commonly known as: MUCINEX Take 1 tablet (600 mg total) by mouth 2 (two) times daily as needed.   ipratropium-albuterol 0.5-2.5 (3) MG/3ML Soln Commonly known as: DUONEB Use twice a day scheduled and every 6 hours as needed for shortness of breath and wheezing   levocetirizine 5 MG tablet Commonly known as: XYZAL Take 5 mg by mouth in the  morning.   loperamide 2 MG capsule Commonly known as: IMODIUM Take 2 mg by mouth as needed for diarrhea or loose stools.   magnesium citrate Soln Take 150 mLs by mouth daily as needed (for contipation).   Menthol (Topical Analgesic) 4 % Gel Apply 1 application  topically 2 (two) times a day. Left shoulder pain   Biofreeze 4 % Gel Generic drug: Menthol (Topical Analgesic) Apply 1 application topically See admin instructions. Apply to the left shoulders 2 times a day   methylPREDNISolone 4 MG Tbpk tablet Commonly known as: MEDROL DOSEPAK follow package directions   pantoprazole 40 MG tablet Commonly known as: PROTONIX Take 1 tablet (40 mg total) by mouth 2 (two) times daily.   polyethylene glycol 17 g packet Commonly known as: MIRALAX / GLYCOLAX Take 17 g by mouth daily.   QUEtiapine 25 MG tablet Commonly known as: SEROQUEL Take 1 tablet (25 mg total) by mouth at bedtime.   rosuvastatin 40 MG tablet Commonly known as: CRESTOR Take 40 mg by mouth daily.   sennosides-docusate sodium 8.6-50 MG tablet Commonly known as: SENOKOT-S Take 1 tablet by mouth daily.   sertraline 100 MG tablet Commonly known as: ZOLOFT Take 200 mg by mouth in the morning.   thiamine 100 MG tablet Commonly known as: Vitamin B-1 Take 250 mg by mouth 2 (two) times a day.   zolpidem 5 MG tablet Commonly known as: AMBIEN Take 5 mg by mouth at bedtime.               Durable Medical Equipment  (From admission, onward)           Start     Ordered    06/11/23 0532  For home use only DME Nebulizer/meds  Once       Question Answer Comment  Patient needs a nebulizer to treat with the following condition COPD (chronic obstructive pulmonary disease) (HCC)   Length of Need 6 Months      06/11/23 0531   06/11/23 0532  For home use only DME oxygen  Once       Question Answer Comment  Length of Need 6 Months   Mode or (Route) Nasal cannula   Liters per Minute 3   Frequency Continuous (stationary and portable oxygen unit needed)   Oxygen conserving device Yes   Oxygen delivery system Gas      06/11/23 0531             Follow-up Information     [facility] Schedule an appointment as soon as possible for a visit in 3 day(s).   Specialty: Geriatric Medicine Contact information: [address] [place] [address] [phone]                 Major procedures and Radiology Reports - PLEASE review detailed and final reports thoroughly  -      DG Chest Port 1 View Result Date: 06/09/2023 CLINICAL DATA:  Wheezing. EXAM: PORTABLE CHEST 1 VIEW COMPARISON:  06/06/2023 FINDINGS: The cardio pericardial silhouette is enlarged. Diffuse interstitial opacity again noted with new patchy airspace disease in the right lung base, suspicious for pneumonia. No substantial pleural effusion. No acute bony abnormality. IMPRESSION: Chronic interstitial lung disease with new subtle areas of patchy airspace disease at the right base, suspicious for pneumonia. Electronically Signed   By: Kennith Center M.D.   On: 06/09/2023 11:06   DG Chest Portable 1 View Result Date: 06/06/2023 CLINICAL DATA:  78 year old  female with shortness of breath, productive cough. Wheezing. EXAM: PORTABLE CHEST 1 VIEW COMPARISON:  Chest radiographs 10/18/2020 and earlier. FINDINGS: Portable AP upright view at 1225 hours. Mildly improved lung volumes compared to prior. Chronic elevation of the right hemidiaphragm is mild and stable. Calcified  aortic atherosclerosis. Other mediastinal contours are within normal limits. Coarse bilateral chronic pulmonary interstitial opacity, not significantly changed from 2020. Bronchiectasis at the lung bases demonstrated on a 2022 CT. No pneumothorax, pulmonary edema, pleural effusion or confluent lung opacity. No acute osseous abnormality identified. IMPRESSION: Chronic lung disease with interstitial changes on prior CT. No acute cardiopulmonary abnormality. Electronically Signed   By: Odessa Fleming M.D.   On: 06/06/2023 12:54    Micro Results    Recent Results (from the past 240 hours)  Resp panel by RT-PCR (RSV, Flu A&B, Covid)     Status: Abnormal   Collection Time: 06/06/23 12:15 PM  Result Value Ref Range Status   SARS Coronavirus 2 by RT PCR NEGATIVE NEGATIVE Final   Influenza A by PCR NEGATIVE NEGATIVE Final   Influenza B by PCR NEGATIVE NEGATIVE Final    Comment: (NOTE) The Xpert Xpress SARS-CoV-2/FLU/RSV plus assay is intended as an aid in the diagnosis of influenza from Nasopharyngeal swab specimens and should not be used as a sole basis for treatment. Nasal washings and aspirates are unacceptable for Xpert Xpress SARS-CoV-2/FLU/RSV testing.  Fact Sheet for Patients: BloggerCourse.com  Fact Sheet for Healthcare Providers: SeriousBroker.it  This test is not yet approved or cleared by the [place] FDA and has been authorized for detection and/or diagnosis of SARS-CoV-2 by FDA under an Emergency Use Authorization (EUA). This EUA will remain in effect (meaning this test can be used) for the duration of the COVID-19 declaration under Section 564(b)(1) of the Act, 21 U.S.C. section 360bbb-3(b)(1), unless the authorization is terminated or revoked.     Resp Syncytial Virus by PCR POSITIVE (A) NEGATIVE Final    Comment: (NOTE) Fact Sheet for Patients: BloggerCourse.com  Fact Sheet for Healthcare  Providers: SeriousBroker.it  This test is not yet approved or cleared by the [place] FDA and has been authorized for detection and/or diagnosis of SARS-CoV-2 by FDA under an Emergency Use Authorization (EUA). This EUA will remain in effect (meaning this test can be used) for the duration of the COVID-19 declaration under Section 564(b)(1) of the Act, 21 U.S.C. section 360bbb-3(b)(1), unless the authorization is terminated or revoked.  Performed at [facility], [address], [place], [place] [address]   Culture, blood (Routine X 2) w Reflex to ID Panel     Status: None (Preliminary result)   Collection Time: 06/09/23 10:53 PM   Specimen: BLOOD LEFT HAND  Result Value Ref Range Status   Specimen Description BLOOD LEFT HAND  Final   Special Requests   Final    BOTTLES DRAWN AEROBIC AND ANAEROBIC Blood Culture results may not be optimal due to an inadequate volume of blood received in culture bottles   Culture   Final    NO GROWTH 3 DAYS Performed at [facility], [address] [place], [place] [address]    Report Status PENDING  Incomplete  Culture, blood (Routine X 2) w Reflex to ID Panel     Status: None (Preliminary result)   Collection Time: 06/09/23 10:53 PM   Specimen: BLOOD LEFT HAND  Result Value Ref Range Status   Specimen Description BLOOD LEFT HAND  Final   Special Requests  Final    BOTTLES DRAWN AEROBIC ONLY Blood Culture results may not be optimal due to an inadequate volume of blood received in culture bottles   Culture   Final    NO GROWTH 3 DAYS Performed at [facility], [address] [place], [place] [address]    Report Status PENDING  Incomplete  MRSA Next Gen by PCR, Nasal     Status: None   Collection Time: 06/10/23  7:22 AM   Specimen: Nasal Mucosa; Nasal Swab  Result Value Ref Range Status   MRSA by PCR Next Gen NOT DETECTED NOT DETECTED Final    Comment: (NOTE) The GeneXpert MRSA Assay (FDA  approved for NASAL specimens only), is one component of a comprehensive MRSA colonization surveillance program. It is not intended to diagnose MRSA infection nor to guide or monitor treatment for MRSA infections. Test performance is not FDA approved in patients less than 88 years old. Performed at [facility], [address], [place], [place] [address]     Today   Subjective    Angelica Kelley today has no headache,no chest abdominal pain,no new weakness tingling or numbness, feels much better wants to go home today.    Objective   Blood pressure 125/78, pulse 88, temperature (!) 97.1 F (36.2 C), temperature source Oral, resp. rate 16, weight 72.3 kg, SpO2 93%.   Intake/Output Summary (Last 24 hours) at 06/12/2023 0903 Last data filed at 06/12/2023 0800 Gross per 24 hour  Intake 483 ml  Output --  Net 483 ml    Exam  Awake but minimally confused, No new F.N deficits,    [place].AT,PERRAL Supple Neck,   Symmetrical Chest wall movement, Good air movement bilaterally, few coarse crackles. RRR,No Gallops,   +ve B.Sounds, Abd Soft, Non tender,  No Cyanosis, Clubbing or edema    Data Review   Recent Labs  Lab 06/06/23 1123 06/07/23 0505 06/09/23 0756 06/10/23 0441  WBC 5.0 4.4 10.1 9.3  HGB 9.4* 9.1* 10.8* 11.5*  HCT 32.5* 31.2* 35.6* 38.9  PLT 165 150 237 253  MCV 76.7* 75.9* 72.1* 73.8*  MCH 22.2* 22.1* 21.9* 21.8*  MCHC 28.9* 29.2* 30.3 29.6*  RDW 16.9* 17.0* 17.0* 17.0*  LYMPHSABS  --   --  0.9 0.6*  MONOABS  --   --  0.5 0.4  EOSABS  --   --  0.0 0.0  BASOSABS  --   --  0.0 0.0    Recent Labs  Lab 06/06/23 1123 06/07/23 0505 06/09/23 0756 06/09/23 2254 06/10/23 0441  NA 141 143 137  --  138  K 3.4* 4.0 3.0*  --  4.3  CL 106 107 99  --  101  CO2 27 27 24   --  23  ANIONGAP 8 9 14   --  14  GLUCOSE 117* 125* 120*  --  142*  BUN 10 11 20   --  25*  CREATININE 0.74 0.70 0.88  --  0.77  AST 22  --   --   --   --   ALT 13  --   --   --   --   ALKPHOS  50  --   --   --   --   BILITOT 0.7  --   --   --   --   ALBUMIN 3.4*  --   --   --   --   CRP  --   --  0.6  --  0.5  PROCALCITON  --   --  <0.10  --  <0.10  BNP 195.9*  --  133.6*  --  95.7  MG  --   --  2.1 2.4 2.5*  PHOS  --   --  4.5  --  5.2*  CALCIUM 8.6* 8.6* 9.1  --  9.1    Total Time in preparing paper work, data evaluation and todays exam - 35 minutes  Signature  -    Susa Raring M.D on 06/12/2023 at 9:03 AM   -  To page go to [email or web address]

## 2023-06-14 LAB — CULTURE, BLOOD (ROUTINE X 2)
Culture: NO GROWTH
Culture: NO GROWTH

## 2023-07-02 ENCOUNTER — Ambulatory Visit
Admission: RE | Admit: 2023-07-02 | Discharge: 2023-07-02 | Disposition: A | Discharge status: Home / Self Care | Payer: Medicare HMO | Source: Ambulatory Visit | Attending: Nurse Practitioner | Admitting: Nurse Practitioner

## 2023-07-02 DIAGNOSIS — Z1231 Encounter for screening mammogram for malignant neoplasm of breast: Secondary | ICD-10-CM
